# Patient Record
Sex: Male | Born: 1955 | Hispanic: No | Smoking: Current every day smoker
Health system: Southern US, Community
[De-identification: ages and names within clinical notes are randomized; demographics above are authoritative.]

## PROBLEM LIST (undated history)

## (undated) VITALS — BP 89/57 | HR 94 | Temp 98.3°F | Resp 18 | Ht 70.5 in | Wt 186.0 lb

## (undated) VITALS — BP 89/63 | HR 84 | Temp 97.2°F | Resp 16 | Ht 71.0 in | Wt 180.0 lb

## (undated) DIAGNOSIS — G35 Multiple sclerosis: Secondary | ICD-10-CM

## (undated) DIAGNOSIS — G259 Extrapyramidal and movement disorder, unspecified: Secondary | ICD-10-CM

## (undated) DIAGNOSIS — F32A Depression, unspecified: Secondary | ICD-10-CM

## (undated) DIAGNOSIS — H539 Unspecified visual disturbance: Secondary | ICD-10-CM

## (undated) DIAGNOSIS — F329 Major depressive disorder, single episode, unspecified: Secondary | ICD-10-CM

## (undated) DIAGNOSIS — G35D Multiple sclerosis, unspecified: Secondary | ICD-10-CM

## (undated) DIAGNOSIS — G35A Relapsing-remitting multiple sclerosis: Secondary | ICD-10-CM

## (undated) DIAGNOSIS — G709 Myoneural disorder, unspecified: Secondary | ICD-10-CM

## (undated) DIAGNOSIS — F99 Mental disorder, not otherwise specified: Secondary | ICD-10-CM

## (undated) HISTORY — DX: Unspecified visual disturbance: H53.9

## (undated) HISTORY — PX: TONSILLECTOMY: SUR1361

## (undated) HISTORY — DX: Extrapyramidal and movement disorder, unspecified: G25.9

---

## 1997-07-30 ENCOUNTER — Other Ambulatory Visit: Admission: RE | Admit: 1997-07-30 | Discharge: 1997-07-30 | Payer: Self-pay | Admitting: Obstetrics and Gynecology

## 1997-09-14 ENCOUNTER — Ambulatory Visit (HOSPITAL_COMMUNITY): Admission: RE | Admit: 1997-09-14 | Discharge: 1997-09-14 | Payer: Self-pay | Admitting: Urology

## 1998-06-23 ENCOUNTER — Ambulatory Visit (HOSPITAL_COMMUNITY): Admission: RE | Admit: 1998-06-23 | Discharge: 1998-06-23 | Payer: Self-pay | Admitting: Obstetrics and Gynecology

## 1998-09-30 ENCOUNTER — Ambulatory Visit (HOSPITAL_COMMUNITY): Admission: RE | Admit: 1998-09-30 | Discharge: 1998-09-30 | Payer: Self-pay | Admitting: Neurology

## 2001-09-14 ENCOUNTER — Encounter: Payer: Self-pay | Admitting: Emergency Medicine

## 2001-09-14 ENCOUNTER — Emergency Department (HOSPITAL_COMMUNITY): Admission: EM | Admit: 2001-09-14 | Discharge: 2001-09-14 | Payer: Self-pay | Admitting: Emergency Medicine

## 2003-10-17 ENCOUNTER — Emergency Department (HOSPITAL_COMMUNITY): Admission: EM | Admit: 2003-10-17 | Discharge: 2003-10-17 | Payer: Self-pay | Admitting: *Deleted

## 2006-12-09 ENCOUNTER — Ambulatory Visit (HOSPITAL_COMMUNITY): Admission: RE | Admit: 2006-12-09 | Discharge: 2006-12-09 | Payer: Self-pay | Admitting: Urology

## 2006-12-10 ENCOUNTER — Emergency Department (HOSPITAL_COMMUNITY): Admission: EM | Admit: 2006-12-10 | Discharge: 2006-12-11 | Payer: Self-pay | Admitting: Emergency Medicine

## 2007-01-15 ENCOUNTER — Ambulatory Visit (HOSPITAL_COMMUNITY): Payer: Self-pay | Admitting: Psychiatry

## 2008-04-21 ENCOUNTER — Ambulatory Visit: Payer: Self-pay | Admitting: Psychiatry

## 2008-04-21 ENCOUNTER — Inpatient Hospital Stay (HOSPITAL_COMMUNITY): Admission: RE | Admit: 2008-04-21 | Discharge: 2008-04-27 | Payer: Self-pay | Admitting: Psychiatry

## 2008-04-29 ENCOUNTER — Other Ambulatory Visit (HOSPITAL_COMMUNITY): Admission: RE | Admit: 2008-04-29 | Discharge: 2008-05-21 | Payer: Self-pay | Admitting: Psychiatry

## 2008-05-04 ENCOUNTER — Ambulatory Visit: Payer: Self-pay | Admitting: Psychiatry

## 2008-05-26 ENCOUNTER — Inpatient Hospital Stay (HOSPITAL_COMMUNITY): Admission: RE | Admit: 2008-05-26 | Discharge: 2008-06-01 | Payer: Self-pay | Admitting: *Deleted

## 2008-05-27 ENCOUNTER — Ambulatory Visit: Payer: Self-pay | Admitting: *Deleted

## 2008-07-16 ENCOUNTER — Inpatient Hospital Stay (HOSPITAL_COMMUNITY): Admission: EM | Admit: 2008-07-16 | Discharge: 2008-07-20 | Payer: Self-pay | Admitting: Emergency Medicine

## 2008-07-16 ENCOUNTER — Ambulatory Visit: Payer: Self-pay | Admitting: Pulmonary Disease

## 2008-07-20 ENCOUNTER — Inpatient Hospital Stay (HOSPITAL_COMMUNITY): Admission: RE | Admit: 2008-07-20 | Discharge: 2008-07-27 | Payer: Self-pay | Admitting: Psychiatry

## 2008-07-20 ENCOUNTER — Ambulatory Visit: Payer: Self-pay | Admitting: *Deleted

## 2008-09-15 ENCOUNTER — Emergency Department (HOSPITAL_COMMUNITY): Admission: EM | Admit: 2008-09-15 | Discharge: 2008-09-16 | Payer: Self-pay | Admitting: Emergency Medicine

## 2008-09-16 ENCOUNTER — Inpatient Hospital Stay (HOSPITAL_COMMUNITY): Admission: RE | Admit: 2008-09-16 | Discharge: 2008-09-21 | Payer: Self-pay | Admitting: Psychiatry

## 2008-09-16 ENCOUNTER — Ambulatory Visit: Payer: Self-pay | Admitting: Psychiatry

## 2008-12-09 ENCOUNTER — Emergency Department (HOSPITAL_COMMUNITY): Admission: EM | Admit: 2008-12-09 | Discharge: 2008-12-10 | Payer: Self-pay | Admitting: Emergency Medicine

## 2008-12-10 ENCOUNTER — Ambulatory Visit: Payer: Self-pay | Admitting: *Deleted

## 2008-12-10 ENCOUNTER — Inpatient Hospital Stay (HOSPITAL_COMMUNITY): Admission: RE | Admit: 2008-12-10 | Discharge: 2008-12-14 | Payer: Self-pay | Admitting: *Deleted

## 2008-12-24 ENCOUNTER — Inpatient Hospital Stay (HOSPITAL_COMMUNITY): Admission: EM | Admit: 2008-12-24 | Discharge: 2009-01-04 | Payer: Self-pay | Admitting: Emergency Medicine

## 2008-12-24 ENCOUNTER — Ambulatory Visit: Payer: Self-pay | Admitting: Pulmonary Disease

## 2009-05-04 ENCOUNTER — Inpatient Hospital Stay (HOSPITAL_COMMUNITY): Admission: RE | Admit: 2009-05-04 | Discharge: 2009-05-16 | Payer: Self-pay | Admitting: Psychiatry

## 2009-05-04 ENCOUNTER — Ambulatory Visit: Payer: Self-pay | Admitting: Psychiatry

## 2009-05-24 ENCOUNTER — Emergency Department (HOSPITAL_COMMUNITY): Admission: EM | Admit: 2009-05-24 | Discharge: 2009-05-30 | Payer: Self-pay | Admitting: Emergency Medicine

## 2009-11-13 ENCOUNTER — Emergency Department (HOSPITAL_COMMUNITY): Admission: EM | Admit: 2009-11-13 | Discharge: 2009-11-15 | Payer: Self-pay | Admitting: Emergency Medicine

## 2009-11-14 ENCOUNTER — Ambulatory Visit: Payer: Self-pay | Admitting: Psychiatry

## 2009-11-15 ENCOUNTER — Inpatient Hospital Stay (HOSPITAL_COMMUNITY)
Admission: RE | Admit: 2009-11-15 | Discharge: 2009-11-18 | Payer: Self-pay | Source: Home / Self Care | Admitting: Psychiatry

## 2009-11-15 ENCOUNTER — Ambulatory Visit: Payer: Self-pay | Admitting: Psychiatry

## 2010-01-12 ENCOUNTER — Encounter: Admission: RE | Admit: 2010-01-12 | Discharge: 2010-01-12 | Payer: Self-pay | Admitting: Family Medicine

## 2010-07-08 LAB — BASIC METABOLIC PANEL
BUN: 12 mg/dL (ref 6–23)
CO2: 23 mEq/L (ref 19–32)
Calcium: 8.7 mg/dL (ref 8.4–10.5)
Chloride: 111 mEq/L (ref 96–112)
Creatinine, Ser: 1.01 mg/dL (ref 0.4–1.5)
GFR calc Af Amer: >60 mL/min (ref >60)
GFR calc non Af Amer: >60 mL/min (ref >60)
Glucose, Bld: 93 mg/dL (ref 70–99)
Potassium: 3.6 mEq/L (ref 3.5–5.1)
Sodium: 141 mEq/L (ref 135–145)

## 2010-07-08 LAB — URINALYSIS, ROUTINE W REFLEX MICROSCOPIC
Bilirubin Urine: NEGATIVE
Glucose, UA: NEGATIVE mg/dL
Hgb urine dipstick: NEGATIVE
Nitrite: NEGATIVE
Protein, ur: NEGATIVE mg/dL
Specific Gravity, Urine: 1.024 (ref 1.005–1.030)
Urobilinogen, UA: 1 mg/dL (ref 0.0–1.0)
pH: 6 (ref 5.0–8.0)

## 2010-07-08 LAB — DIFFERENTIAL
Basophils Absolute: 0.1 10*3/uL (ref 0.0–0.1)
Basophils Relative: 1 % (ref 0–1)
Eosinophils Absolute: 0.1 10*3/uL (ref 0.0–0.7)
Eosinophils Relative: 1 % (ref 0–5)
Lymphocytes Relative: 4 % — ABNORMAL LOW (ref 12–46)
Lymphs Abs: 0.2 10*3/uL — ABNORMAL LOW (ref 0.7–4.0)
Monocytes Absolute: 0.5 10*3/uL (ref 0.1–1.0)
Monocytes Relative: 10 % (ref 3–12)
Neutro Abs: 4.3 10*3/uL (ref 1.7–7.7)
Neutrophils Relative %: 84 % — ABNORMAL HIGH (ref 43–77)

## 2010-07-08 LAB — HEPATIC FUNCTION PANEL
ALT: 11 U/L (ref 0–53)
AST: 17 U/L (ref 0–37)
Albumin: 3.4 g/dL — ABNORMAL LOW (ref 3.5–5.2)
Alkaline Phosphatase: 51 U/L (ref 39–117)
Bilirubin, Direct: 0.1 mg/dL (ref 0.0–0.3)
Indirect Bilirubin: 0.6 mg/dL (ref 0.3–0.9)
Total Bilirubin: 0.7 mg/dL (ref 0.3–1.2)
Total Protein: 6.2 g/dL (ref 6.0–8.3)

## 2010-07-08 LAB — RAPID URINE DRUG SCREEN, HOSP PERFORMED
Amphetamines: NOT DETECTED
Barbiturates: NOT DETECTED
Benzodiazepines: NOT DETECTED
Cocaine: NOT DETECTED
Opiates: NOT DETECTED
Tetrahydrocannabinol: NOT DETECTED

## 2010-07-08 LAB — CBC
HCT: 42 % (ref 39.0–52.0)
Hemoglobin: 14.6 g/dL (ref 13.0–17.0)
MCH: 32.2 pg (ref 26.0–34.0)
MCHC: 34.8 g/dL (ref 30.0–36.0)
MCV: 92.4 fL (ref 78.0–100.0)
Platelets: 211 10*3/uL (ref 150–400)
RBC: 4.54 MIL/uL (ref 4.22–5.81)
RDW: 15.2 % (ref 11.5–15.5)
WBC: 5.1 10*3/uL (ref 4.0–10.5)

## 2010-07-08 LAB — TSH: TSH: 4.332 u[IU]/mL (ref 0.350–4.500)

## 2010-07-08 LAB — ETHANOL: Alcohol, Ethyl (B): 65 mg/dL — ABNORMAL HIGH (ref 0–10)

## 2010-07-09 LAB — DRUGS OF ABUSE SCREEN W/O ALC, ROUTINE URINE
Amphetamine Screen, Ur: NEGATIVE
Barbiturate Quant, Ur: NEGATIVE
Benzodiazepines.: NEGATIVE
Cocaine Metabolites: NEGATIVE
Creatinine,U: 59.5 mg/dL
Marijuana Metabolite: NEGATIVE
Methadone: NEGATIVE
Opiate Screen, Urine: NEGATIVE
Phencyclidine (PCP): NEGATIVE
Propoxyphene: NEGATIVE

## 2010-07-09 LAB — URINALYSIS, ROUTINE W REFLEX MICROSCOPIC
Bilirubin Urine: NEGATIVE
Glucose, UA: NEGATIVE mg/dL
Hgb urine dipstick: NEGATIVE
Ketones, ur: NEGATIVE mg/dL
Nitrite: NEGATIVE
Protein, ur: NEGATIVE mg/dL
Specific Gravity, Urine: 1.013 (ref 1.005–1.030)
Urobilinogen, UA: 1 mg/dL (ref 0.0–1.0)
pH: 7 (ref 5.0–8.0)

## 2010-07-09 LAB — COMPREHENSIVE METABOLIC PANEL
ALT: 12 U/L (ref 0–53)
AST: 19 U/L (ref 0–37)
Albumin: 3.8 g/dL (ref 3.5–5.2)
Alkaline Phosphatase: 58 U/L (ref 39–117)
BUN: 18 mg/dL (ref 6–23)
CO2: 27 mEq/L (ref 19–32)
Calcium: 9.1 mg/dL (ref 8.4–10.5)
Chloride: 109 mEq/L (ref 96–112)
Creatinine, Ser: 1.05 mg/dL (ref 0.4–1.5)
GFR calc Af Amer: >60 mL/min (ref >60)
GFR calc non Af Amer: >60 mL/min (ref >60)
Glucose, Bld: 94 mg/dL (ref 70–99)
Potassium: 4 mEq/L (ref 3.5–5.1)
Sodium: 142 mEq/L (ref 135–145)
Total Bilirubin: 0.6 mg/dL (ref 0.3–1.2)
Total Protein: 6.2 g/dL (ref 6.0–8.3)

## 2010-07-09 LAB — CBC
HCT: 40.7 % (ref 39.0–52.0)
Hemoglobin: 14 g/dL (ref 13.0–17.0)
MCHC: 34.4 g/dL (ref 30.0–36.0)
MCV: 92.3 fL (ref 78.0–100.0)
Platelets: 168 10*3/uL (ref 150–400)
RBC: 4.41 MIL/uL (ref 4.22–5.81)
RDW: 14.8 % (ref 11.5–15.5)
WBC: 9.9 10*3/uL (ref 4.0–10.5)

## 2010-07-09 LAB — LIPID PANEL
Cholesterol: 234 mg/dL — ABNORMAL HIGH (ref 0–200)
HDL: 34 mg/dL — ABNORMAL LOW (ref >39)
LDL Cholesterol: 153 mg/dL — ABNORMAL HIGH (ref 0–99)
Total CHOL/HDL Ratio: 6.9 RATIO
Triglycerides: 236 mg/dL — ABNORMAL HIGH (ref <150)
VLDL: 47 mg/dL — ABNORMAL HIGH (ref 0–40)

## 2010-07-09 LAB — PSA: PSA: 0.83 ng/mL (ref 0.10–4.00)

## 2010-07-09 LAB — TESTOSTERONE: Testosterone: 453.46 ng/dL (ref 350–890)

## 2010-07-09 LAB — TSH: TSH: 2.912 u[IU]/mL (ref 0.350–4.500)

## 2010-07-09 LAB — HEMOGLOBIN A1C
Hgb A1c MFr Bld: 5.6 % (ref 4.6–6.1)
Mean Plasma Glucose: 114 mg/dL

## 2010-07-09 LAB — RPR: RPR Ser Ql: NONREACTIVE

## 2010-07-09 LAB — T4, FREE: Free T4: 1.18 ng/dL (ref 0.80–1.80)

## 2010-07-12 LAB — URINALYSIS, ROUTINE W REFLEX MICROSCOPIC
Glucose, UA: NEGATIVE mg/dL
Hgb urine dipstick: NEGATIVE
Ketones, ur: >80 mg/dL — AB
Leukocytes, UA: NEGATIVE
Nitrite: NEGATIVE
Protein, ur: 30 mg/dL — AB
Specific Gravity, Urine: 1.027 (ref 1.005–1.030)
Urobilinogen, UA: 1 mg/dL (ref 0.0–1.0)
pH: 6 (ref 5.0–8.0)

## 2010-07-12 LAB — BASIC METABOLIC PANEL
BUN: 15 mg/dL (ref 6–23)
CO2: 20 mEq/L (ref 19–32)
Calcium: 9.1 mg/dL (ref 8.4–10.5)
Chloride: 104 mEq/L (ref 96–112)
Creatinine, Ser: 1.34 mg/dL (ref 0.4–1.5)
GFR calc Af Amer: >60 mL/min (ref >60)
GFR calc non Af Amer: 56 mL/min — ABNORMAL LOW (ref >60)
Glucose, Bld: 62 mg/dL — ABNORMAL LOW (ref 70–99)
Potassium: 4.5 mEq/L (ref 3.5–5.1)
Sodium: 135 mEq/L (ref 135–145)

## 2010-07-12 LAB — DIFFERENTIAL
Basophils Absolute: 0.1 10*3/uL (ref 0.0–0.1)
Basophils Relative: 1 % (ref 0–1)
Eosinophils Absolute: 0.1 10*3/uL (ref 0.0–0.7)
Eosinophils Relative: 1 % (ref 0–5)
Lymphocytes Relative: 46 % (ref 12–46)
Lymphs Abs: 4.7 10*3/uL — ABNORMAL HIGH (ref 0.7–4.0)
Monocytes Absolute: 0.6 10*3/uL (ref 0.1–1.0)
Monocytes Relative: 6 % (ref 3–12)
Neutro Abs: 4.7 10*3/uL (ref 1.7–7.7)
Neutrophils Relative %: 46 % (ref 43–77)

## 2010-07-12 LAB — CBC
HCT: 44.1 % (ref 39.0–52.0)
Hemoglobin: 15.3 g/dL (ref 13.0–17.0)
MCHC: 34.6 g/dL (ref 30.0–36.0)
MCV: 92.1 fL (ref 78.0–100.0)
Platelets: 251 10*3/uL (ref 150–400)
RBC: 4.79 MIL/uL (ref 4.22–5.81)
RDW: 14.7 % (ref 11.5–15.5)
WBC: 10.2 10*3/uL (ref 4.0–10.5)

## 2010-07-12 LAB — RAPID URINE DRUG SCREEN, HOSP PERFORMED
Amphetamines: NOT DETECTED
Barbiturates: NOT DETECTED
Benzodiazepines: NOT DETECTED
Cocaine: NOT DETECTED
Opiates: NOT DETECTED
Tetrahydrocannabinol: NOT DETECTED

## 2010-07-12 LAB — TRICYCLICS SCREEN, URINE: TCA Scrn: NOT DETECTED

## 2010-07-12 LAB — ETHANOL: Alcohol, Ethyl (B): <5 mg/dL (ref 0–10)

## 2010-07-12 LAB — URINE MICROSCOPIC-ADD ON

## 2010-07-28 LAB — DIFFERENTIAL
Basophils Absolute: 0.1 10*3/uL (ref 0.0–0.1)
Basophils Relative: 1 % (ref 0–1)
Eosinophils Absolute: 0.1 10*3/uL (ref 0.0–0.7)
Eosinophils Relative: 1 % (ref 0–5)
Lymphocytes Relative: 54 % — ABNORMAL HIGH (ref 12–46)
Lymphs Abs: 5.6 10*3/uL — ABNORMAL HIGH (ref 0.7–4.0)
Monocytes Absolute: 0.6 10*3/uL (ref 0.1–1.0)
Monocytes Relative: 5 % (ref 3–12)
Neutro Abs: 4 10*3/uL (ref 1.7–7.7)
Neutrophils Relative %: 39 % — ABNORMAL LOW (ref 43–77)

## 2010-07-28 LAB — CARDIAC PANEL(CRET KIN+CKTOT+MB+TROPI)
CK, MB: 1.2 ng/mL (ref 0.3–4.0)
Relative Index: INVALID (ref 0.0–2.5)
Total CK: 71 U/L (ref 7–232)
Troponin I: 0.01 ng/mL (ref 0.00–0.06)

## 2010-07-28 LAB — BASIC METABOLIC PANEL
BUN: 7 mg/dL (ref 6–23)
BUN: 9 mg/dL (ref 6–23)
CO2: 28 mEq/L (ref 19–32)
CO2: 32 mEq/L (ref 19–32)
Calcium: 8.7 mg/dL (ref 8.4–10.5)
Calcium: 8.8 mg/dL (ref 8.4–10.5)
Chloride: 105 mEq/L (ref 96–112)
Chloride: 110 mEq/L (ref 96–112)
Creatinine, Ser: 0.86 mg/dL (ref 0.4–1.5)
Creatinine, Ser: 1.07 mg/dL (ref 0.4–1.5)
GFR calc Af Amer: >60 mL/min (ref >60)
GFR calc Af Amer: >60 mL/min (ref >60)
GFR calc non Af Amer: >60 mL/min (ref >60)
GFR calc non Af Amer: >60 mL/min (ref >60)
Glucose, Bld: 82 mg/dL (ref 70–99)
Glucose, Bld: 93 mg/dL (ref 70–99)
Potassium: 3.7 mEq/L (ref 3.5–5.1)
Potassium: 3.9 mEq/L (ref 3.5–5.1)
Sodium: 142 mEq/L (ref 135–145)
Sodium: 145 mEq/L (ref 135–145)

## 2010-07-28 LAB — URINE CULTURE
Colony Count: NO GROWTH
Culture: NO GROWTH

## 2010-07-28 LAB — CBC
HCT: 37.1 % — ABNORMAL LOW (ref 39.0–52.0)
HCT: 42.4 % (ref 39.0–52.0)
HCT: 42.4 % (ref 39.0–52.0)
Hemoglobin: 12.7 g/dL — ABNORMAL LOW (ref 13.0–17.0)
Hemoglobin: 14.4 g/dL (ref 13.0–17.0)
Hemoglobin: 14.6 g/dL (ref 13.0–17.0)
MCHC: 34 g/dL (ref 30.0–36.0)
MCHC: 34.3 g/dL (ref 30.0–36.0)
MCHC: 34.4 g/dL (ref 30.0–36.0)
MCV: 93.3 fL (ref 78.0–100.0)
MCV: 93.3 fL (ref 78.0–100.0)
MCV: 93.5 fL (ref 78.0–100.0)
Platelets: 164 10*3/uL (ref 150–400)
Platelets: 176 10*3/uL (ref 150–400)
Platelets: 180 10*3/uL (ref 150–400)
RBC: 3.98 MIL/uL — ABNORMAL LOW (ref 4.22–5.81)
RBC: 4.53 MIL/uL (ref 4.22–5.81)
RBC: 4.54 MIL/uL (ref 4.22–5.81)
RDW: 14.4 % (ref 11.5–15.5)
RDW: 14.6 % (ref 11.5–15.5)
RDW: 14.9 % (ref 11.5–15.5)
WBC: 10.1 10*3/uL (ref 4.0–10.5)
WBC: 10.4 10*3/uL (ref 4.0–10.5)
WBC: 12.5 10*3/uL — ABNORMAL HIGH (ref 4.0–10.5)

## 2010-07-28 LAB — RAPID URINE DRUG SCREEN, HOSP PERFORMED
Amphetamines: NOT DETECTED
Barbiturates: NOT DETECTED
Benzodiazepines: NOT DETECTED
Cocaine: NOT DETECTED
Opiates: NOT DETECTED
Tetrahydrocannabinol: NOT DETECTED

## 2010-07-28 LAB — BLOOD GAS, ARTERIAL
Acid-base deficit: 1.6 mmol/L (ref 0.0–2.0)
Bicarbonate: 24.5 mEq/L — ABNORMAL HIGH (ref 20.0–24.0)
Drawn by: 317771
FIO2: 1 %
MECHVT: 600 mL
O2 Saturation: 96.6 %
PEEP: 5 cmH2O
Patient temperature: 98.6
RATE: 14 resp/min
TCO2: 22.3 mmol/L (ref 0–100)
pCO2 arterial: 49.6 mmHg — ABNORMAL HIGH (ref 35.0–45.0)
pH, Arterial: 7.315 — ABNORMAL LOW (ref 7.350–7.450)
pO2, Arterial: 296 mmHg — ABNORMAL HIGH (ref 80.0–100.0)

## 2010-07-28 LAB — COMPREHENSIVE METABOLIC PANEL
ALT: 14 U/L (ref 0–53)
AST: 19 U/L (ref 0–37)
Albumin: 4.1 g/dL (ref 3.5–5.2)
Alkaline Phosphatase: 62 U/L (ref 39–117)
BUN: 9 mg/dL (ref 6–23)
CO2: 25 mEq/L (ref 19–32)
Calcium: 9.3 mg/dL (ref 8.4–10.5)
Chloride: 109 mEq/L (ref 96–112)
Creatinine, Ser: 0.78 mg/dL (ref 0.4–1.5)
GFR calc Af Amer: >60 mL/min (ref >60)
GFR calc non Af Amer: >60 mL/min (ref >60)
Glucose, Bld: 69 mg/dL — ABNORMAL LOW (ref 70–99)
Potassium: 3.4 mEq/L — ABNORMAL LOW (ref 3.5–5.1)
Sodium: 143 mEq/L (ref 135–145)
Total Bilirubin: 0.5 mg/dL (ref 0.3–1.2)
Total Protein: 7 g/dL (ref 6.0–8.3)

## 2010-07-28 LAB — ETHANOL: Alcohol, Ethyl (B): 157 mg/dL — ABNORMAL HIGH (ref 0–10)

## 2010-07-28 LAB — GLUCOSE, CAPILLARY: Glucose-Capillary: 98 mg/dL (ref 70–99)

## 2010-07-28 LAB — SALICYLATE LEVEL: Salicylate Lvl: <4 mg/dL (ref 2.8–20.0)

## 2010-07-28 LAB — ACETAMINOPHEN LEVEL: Acetaminophen (Tylenol), Serum: <10 ug/mL — ABNORMAL LOW (ref 10–30)

## 2010-07-29 LAB — RAPID URINE DRUG SCREEN, HOSP PERFORMED
Amphetamines: NOT DETECTED
Barbiturates: NOT DETECTED
Benzodiazepines: NOT DETECTED
Cocaine: NOT DETECTED
Opiates: NOT DETECTED
Tetrahydrocannabinol: POSITIVE — AB

## 2010-07-29 LAB — BASIC METABOLIC PANEL
BUN: 14 mg/dL (ref 6–23)
CO2: 27 mEq/L (ref 19–32)
Calcium: 9.1 mg/dL (ref 8.4–10.5)
Chloride: 106 mEq/L (ref 96–112)
Creatinine, Ser: 0.78 mg/dL (ref 0.4–1.5)
GFR calc Af Amer: >60 mL/min (ref >60)
GFR calc non Af Amer: >60 mL/min (ref >60)
Glucose, Bld: 98 mg/dL (ref 70–99)
Potassium: 3.9 mEq/L (ref 3.5–5.1)
Sodium: 142 mEq/L (ref 135–145)

## 2010-07-29 LAB — URINALYSIS, ROUTINE W REFLEX MICROSCOPIC
Bilirubin Urine: NEGATIVE
Glucose, UA: NEGATIVE mg/dL
Hgb urine dipstick: NEGATIVE
Ketones, ur: NEGATIVE mg/dL
Nitrite: NEGATIVE
Protein, ur: NEGATIVE mg/dL
Specific Gravity, Urine: 1.023 (ref 1.005–1.030)
Urobilinogen, UA: 1 mg/dL (ref 0.0–1.0)
pH: 6.5 (ref 5.0–8.0)

## 2010-07-29 LAB — CBC
HCT: 40.6 % (ref 39.0–52.0)
Hemoglobin: 14.3 g/dL (ref 13.0–17.0)
MCHC: 35.1 g/dL (ref 30.0–36.0)
MCV: 92.6 fL (ref 78.0–100.0)
Platelets: 176 10*3/uL (ref 150–400)
RBC: 4.39 MIL/uL (ref 4.22–5.81)
RDW: 13.8 % (ref 11.5–15.5)
WBC: 8.7 10*3/uL (ref 4.0–10.5)

## 2010-07-29 LAB — DIFFERENTIAL
Basophils Absolute: 0.1 10*3/uL (ref 0.0–0.1)
Basophils Relative: 1 % (ref 0–1)
Eosinophils Absolute: 0.2 10*3/uL (ref 0.0–0.7)
Eosinophils Relative: 3 % (ref 0–5)
Lymphocytes Relative: 51 % — ABNORMAL HIGH (ref 12–46)
Lymphs Abs: 4.4 10*3/uL — ABNORMAL HIGH (ref 0.7–4.0)
Monocytes Absolute: 0.5 10*3/uL (ref 0.1–1.0)
Monocytes Relative: 5 % (ref 3–12)
Neutro Abs: 3.5 10*3/uL (ref 1.7–7.7)
Neutrophils Relative %: 40 % — ABNORMAL LOW (ref 43–77)

## 2010-07-29 LAB — ETHANOL: Alcohol, Ethyl (B): <5 mg/dL (ref 0–10)

## 2010-08-01 LAB — COMPREHENSIVE METABOLIC PANEL
ALT: 15 U/L (ref 0–53)
AST: 18 U/L (ref 0–37)
Albumin: 4.3 g/dL (ref 3.5–5.2)
Alkaline Phosphatase: 69 U/L (ref 39–117)
BUN: 8 mg/dL (ref 6–23)
CO2: 23 mEq/L (ref 19–32)
Calcium: 9.2 mg/dL (ref 8.4–10.5)
Chloride: 113 mEq/L — ABNORMAL HIGH (ref 96–112)
Creatinine, Ser: 0.89 mg/dL (ref 0.4–1.5)
GFR calc Af Amer: >60 mL/min (ref >60)
GFR calc non Af Amer: >60 mL/min (ref >60)
Glucose, Bld: 87 mg/dL (ref 70–99)
Potassium: 4.2 mEq/L (ref 3.5–5.1)
Sodium: 142 mEq/L (ref 135–145)
Total Bilirubin: 0.7 mg/dL (ref 0.3–1.2)
Total Protein: 7.1 g/dL (ref 6.0–8.3)

## 2010-08-01 LAB — RAPID URINE DRUG SCREEN, HOSP PERFORMED
Amphetamines: NOT DETECTED
Barbiturates: NOT DETECTED
Benzodiazepines: NOT DETECTED
Cocaine: NOT DETECTED
Opiates: NOT DETECTED
Tetrahydrocannabinol: POSITIVE — AB

## 2010-08-01 LAB — URINALYSIS, ROUTINE W REFLEX MICROSCOPIC
Bilirubin Urine: NEGATIVE
Glucose, UA: NEGATIVE mg/dL
Hgb urine dipstick: NEGATIVE
Ketones, ur: NEGATIVE mg/dL
Nitrite: NEGATIVE
Protein, ur: NEGATIVE mg/dL
Specific Gravity, Urine: 1.013 (ref 1.005–1.030)
Urobilinogen, UA: 0.2 mg/dL (ref 0.0–1.0)
pH: 6 (ref 5.0–8.0)

## 2010-08-01 LAB — CBC
HCT: 42.9 % (ref 39.0–52.0)
Hemoglobin: 14.6 g/dL (ref 13.0–17.0)
MCHC: 34 g/dL (ref 30.0–36.0)
MCV: 93.4 fL (ref 78.0–100.0)
Platelets: 202 10*3/uL (ref 150–400)
RBC: 4.59 MIL/uL (ref 4.22–5.81)
RDW: 15.2 % (ref 11.5–15.5)
WBC: 11.3 10*3/uL — ABNORMAL HIGH (ref 4.0–10.5)

## 2010-08-01 LAB — DIFFERENTIAL
Basophils Absolute: 0.2 10*3/uL — ABNORMAL HIGH (ref 0.0–0.1)
Basophils Relative: 2 % — ABNORMAL HIGH (ref 0–1)
Eosinophils Absolute: 0.1 10*3/uL (ref 0.0–0.7)
Eosinophils Relative: 1 % (ref 0–5)
Lymphocytes Relative: 41 % (ref 12–46)
Lymphs Abs: 4.6 10*3/uL — ABNORMAL HIGH (ref 0.7–4.0)
Monocytes Absolute: 0.6 10*3/uL (ref 0.1–1.0)
Monocytes Relative: 6 % (ref 3–12)
Neutro Abs: 5.7 10*3/uL (ref 1.7–7.7)
Neutrophils Relative %: 51 % (ref 43–77)

## 2010-08-01 LAB — ETHANOL: Alcohol, Ethyl (B): <5 mg/dL (ref 0–10)

## 2010-08-03 LAB — POCT I-STAT 3, ART BLOOD GAS (G3+)
Acid-base deficit: 2 mmol/L (ref 0.0–2.0)
Bicarbonate: 23.5 mEq/L (ref 20.0–24.0)
O2 Saturation: 98 %
Patient temperature: 99.1
TCO2: 25 mmol/L (ref 0–100)
pCO2 arterial: 41.5 mmHg (ref 35.0–45.0)
pH, Arterial: 7.362 (ref 7.350–7.450)
pO2, Arterial: 107 mmHg — ABNORMAL HIGH (ref 80.0–100.0)

## 2010-08-03 LAB — MAGNESIUM: Magnesium: 2.1 mg/dL (ref 1.5–2.5)

## 2010-08-03 LAB — SALICYLATE LEVEL: Salicylate Lvl: <4 mg/dL (ref 2.8–20.0)

## 2010-08-03 LAB — COMPREHENSIVE METABOLIC PANEL
ALT: 12 U/L (ref 0–53)
ALT: 18 U/L (ref 0–53)
AST: 14 U/L (ref 0–37)
AST: 23 U/L (ref 0–37)
Albumin: 3.7 g/dL (ref 3.5–5.2)
Albumin: 4.1 g/dL (ref 3.5–5.2)
Alkaline Phosphatase: 60 U/L (ref 39–117)
Alkaline Phosphatase: 69 U/L (ref 39–117)
BUN: 11 mg/dL (ref 6–23)
BUN: 13 mg/dL (ref 6–23)
CO2: 22 mEq/L (ref 19–32)
CO2: 25 mEq/L (ref 19–32)
Calcium: 8.7 mg/dL (ref 8.4–10.5)
Calcium: 9.5 mg/dL (ref 8.4–10.5)
Chloride: 113 mEq/L — ABNORMAL HIGH (ref 96–112)
Chloride: 114 mEq/L — ABNORMAL HIGH (ref 96–112)
Creatinine, Ser: 0.8 mg/dL (ref 0.4–1.5)
Creatinine, Ser: 0.9 mg/dL (ref 0.4–1.5)
GFR calc Af Amer: >60 mL/min (ref >60)
GFR calc Af Amer: >60 mL/min (ref >60)
GFR calc non Af Amer: >60 mL/min (ref >60)
GFR calc non Af Amer: >60 mL/min (ref >60)
Glucose, Bld: 59 mg/dL — ABNORMAL LOW (ref 70–99)
Glucose, Bld: 81 mg/dL (ref 70–99)
Potassium: 3.5 mEq/L (ref 3.5–5.1)
Potassium: 4.4 mEq/L (ref 3.5–5.1)
Sodium: 143 mEq/L (ref 135–145)
Sodium: 145 mEq/L (ref 135–145)
Total Bilirubin: 0.4 mg/dL (ref 0.3–1.2)
Total Bilirubin: 0.8 mg/dL (ref 0.3–1.2)
Total Protein: 6.1 g/dL (ref 6.0–8.3)
Total Protein: 6.4 g/dL (ref 6.0–8.3)

## 2010-08-03 LAB — RAPID URINE DRUG SCREEN, HOSP PERFORMED
Amphetamines: NOT DETECTED
Barbiturates: NOT DETECTED
Benzodiazepines: NOT DETECTED
Cocaine: NOT DETECTED
Opiates: NOT DETECTED
Tetrahydrocannabinol: POSITIVE — AB

## 2010-08-03 LAB — CULTURE, BLOOD (ROUTINE X 2)
Culture: NO GROWTH
Culture: NO GROWTH

## 2010-08-03 LAB — GLUCOSE, CAPILLARY
Glucose-Capillary: 75 mg/dL (ref 70–99)
Glucose-Capillary: 80 mg/dL (ref 70–99)
Glucose-Capillary: 83 mg/dL (ref 70–99)

## 2010-08-03 LAB — CBC
HCT: 41.1 % (ref 39.0–52.0)
HCT: 41.9 % (ref 39.0–52.0)
Hemoglobin: 14.1 g/dL (ref 13.0–17.0)
Hemoglobin: 14.7 g/dL (ref 13.0–17.0)
MCHC: 34.5 g/dL (ref 30.0–36.0)
MCHC: 35.2 g/dL (ref 30.0–36.0)
MCV: 93.5 fL (ref 78.0–100.0)
MCV: 94.5 fL (ref 78.0–100.0)
Platelets: 163 10*3/uL (ref 150–400)
Platelets: 166 10*3/uL (ref 150–400)
RBC: 4.35 MIL/uL (ref 4.22–5.81)
RBC: 4.48 MIL/uL (ref 4.22–5.81)
RDW: 15.5 % (ref 11.5–15.5)
RDW: 15.6 % — ABNORMAL HIGH (ref 11.5–15.5)
WBC: 10.1 10*3/uL (ref 4.0–10.5)
WBC: 8.1 10*3/uL (ref 4.0–10.5)

## 2010-08-03 LAB — ETHANOL: Alcohol, Ethyl (B): 127 mg/dL — ABNORMAL HIGH (ref 0–10)

## 2010-08-03 LAB — DIFFERENTIAL
Basophils Absolute: 0.1 10*3/uL (ref 0.0–0.1)
Basophils Relative: 1 % (ref 0–1)
Eosinophils Absolute: 0.1 10*3/uL (ref 0.0–0.7)
Eosinophils Relative: 1 % (ref 0–5)
Lymphocytes Relative: 42 % (ref 12–46)
Lymphs Abs: 3.4 10*3/uL (ref 0.7–4.0)
Monocytes Absolute: 0.4 10*3/uL (ref 0.1–1.0)
Monocytes Relative: 6 % (ref 3–12)
Neutro Abs: 4 10*3/uL (ref 1.7–7.7)
Neutrophils Relative %: 50 % (ref 43–77)

## 2010-08-03 LAB — PROTIME-INR
INR: 1 (ref 0.00–1.49)
Prothrombin Time: 12.9 seconds (ref 11.6–15.2)

## 2010-08-03 LAB — CULTURE, BAL-QUANTITATIVE W GRAM STAIN: Colony Count: >=100000

## 2010-08-03 LAB — ACETAMINOPHEN LEVEL: Acetaminophen (Tylenol), Serum: <10 ug/mL — ABNORMAL LOW (ref 10–30)

## 2010-08-03 LAB — PHOSPHORUS: Phosphorus: 4.7 mg/dL — ABNORMAL HIGH (ref 2.3–4.6)

## 2010-08-03 LAB — TRIGLYCERIDES
Triglycerides: 159 mg/dL — ABNORMAL HIGH (ref <150)
Triglycerides: 263 mg/dL — ABNORMAL HIGH (ref <150)

## 2010-08-03 LAB — CHOLESTEROL, TOTAL: Cholesterol: 246 mg/dL — ABNORMAL HIGH (ref 0–200)

## 2010-08-03 LAB — APTT: aPTT: 25 seconds (ref 24–37)

## 2010-08-07 LAB — COMPREHENSIVE METABOLIC PANEL
ALT: 14 U/L (ref 0–53)
AST: 16 U/L (ref 0–37)
Albumin: 3.7 g/dL (ref 3.5–5.2)
Alkaline Phosphatase: 51 U/L (ref 39–117)
BUN: 14 mg/dL (ref 6–23)
CO2: 25 mEq/L (ref 19–32)
Calcium: 9.1 mg/dL (ref 8.4–10.5)
Chloride: 110 mEq/L (ref 96–112)
Creatinine, Ser: 0.97 mg/dL (ref 0.4–1.5)
GFR calc Af Amer: >60 mL/min (ref >60)
GFR calc non Af Amer: >60 mL/min (ref >60)
Glucose, Bld: 91 mg/dL (ref 70–99)
Potassium: 3.7 mEq/L (ref 3.5–5.1)
Sodium: 143 mEq/L (ref 135–145)
Total Bilirubin: 0.8 mg/dL (ref 0.3–1.2)
Total Protein: 6.2 g/dL (ref 6.0–8.3)

## 2010-08-07 LAB — CBC
HCT: 40.4 % (ref 39.0–52.0)
Hemoglobin: 13.8 g/dL (ref 13.0–17.0)
MCHC: 34.1 g/dL (ref 30.0–36.0)
MCV: 93.5 fL (ref 78.0–100.0)
Platelets: 222 10*3/uL (ref 150–400)
RBC: 4.32 MIL/uL (ref 4.22–5.81)
RDW: 15.3 % (ref 11.5–15.5)
WBC: 10.7 10*3/uL — ABNORMAL HIGH (ref 4.0–10.5)

## 2010-09-05 ENCOUNTER — Other Ambulatory Visit: Payer: Self-pay | Admitting: Family Medicine

## 2010-09-05 ENCOUNTER — Ambulatory Visit
Admission: RE | Admit: 2010-09-05 | Discharge: 2010-09-05 | Disposition: A | Discharge status: Home / Self Care | Payer: Medicare Other | Source: Ambulatory Visit | Attending: Family Medicine | Admitting: Family Medicine

## 2010-09-05 DIAGNOSIS — R059 Cough, unspecified: Secondary | ICD-10-CM

## 2010-09-05 DIAGNOSIS — R05 Cough: Secondary | ICD-10-CM

## 2010-09-05 NOTE — Discharge Summary (Signed)
[name], [name]                  ACCOUNT NO.:  [id number]   MEDICAL RECORD NO.:  [id number]          PATIENT TYPE:  INP   LOCATION:  3021                         FACILITY:  [facility]   PHYSICIAN:  Coralyn Helling, MD        DATE OF BIRTH:  20-Mar-1956   DATE OF ADMISSION:  07/16/2008  DATE OF DISCHARGE:  07/19/2008                               DISCHARGE SUMMARY   DISCHARGE DIAGNOSES:  Consist of:  1. Vent-dependent respiratory failure secondary to suicide attempt      with overdose of sedation.  2. Altered mental status.  3. Drug overdose and suicidal attempt.   HPI:  Mr. [name] is a 55 year old white male with history of  multiple sclerosis who told the paramedics he was tired of living and  attempted overdose using benzodiazepines with narcotics and ETOH.  He  required intubation for airway protection.  Pulmonary critical care was  asked to admit to the unit.  He has had endotracheal from July 16, 2008, to July 17, 2008, Foley from July 16, 2008, to July 17, 2008.   PAST MEDICAL HISTORY:  Significant for:  1. Depression.  2. Multiple sclerosis, followed by Dr. Ilsa Iha of [facility]      Neurology.  3. Dyslipidemia.  4. Alcohol abuse.  5. Tobacco abuse.   ALLERGIES:  NO KNOWN ALLERGIES.   LAB DATA:  Hemoglobin 14.4, hematocrit 41.4, platelets are 166, WBCs  10.0, sodium 143, potassium 4.4, chloride 114, CO2 is 22, BUN is 13,  creatinine 0.8, glucose is 81, cholesterol 246, triglycerides are 263,  phosphorus 4.7, calcium 8.7, magnesium 2.1.  Bronchoalveolar lavage  demonstrated gram-negative rods, gram-positive cocci which is  preliminary in nature.  Blood cultures x2 are negative at this time but  they remain preliminary.  Urine culture is no growth.   RADIOGRAPHIC DATA:  Chest x-ray on July 19, 2008, lungs show  emphysematous.  There is a bibasilar air space opacity greater on the  right than on the left.  Heart size is normal.  CT of the head without  contrast  demonstrates no acute intracranial abnormalities, atrophy, and  chronic microvascular ischemic changes, chronic sinus disease, and  postoperative changes.   HOSPITAL COURSE BY DISCHARGE DIAGNOSES:  1. Vent-dependent respiratory failure secondary to suicide attempt.      He was successfully removed from mechanical ventilatory support in      24 hours.  He has no respiratory difficulties at this time and has      reached maximum hospital benefit.  2. Suicide attempt with polypharmacy.  He will be transferred to      [facility] for further evaluation and treatment.  3. History of depression.  Again, he is going to [facility].  4. Multiple sclerosis, followed by Dr. Ilsa Iha at [facility]      Neurology.  This is a moot point at this time.   DISCHARGE MEDICATIONS:  1. Abilify 10 mg b.i.d.  2. Effexor 75 mg daily.  3. He is on lorazepam withdrawal protocol.  He is currently on day 4.  DIET:  Regular diet.   DISPOSITION/CONDITION UPON DISCHARGE:  Vent-dependent respiratory  failure that has resolved.  He has known depression and with a suicide  attempt he is voluntarily being transferred to [facility]  for further evaluation and treatment.      Devra Dopp, MSN, ACNP      Coralyn Helling, MD  Electronically Signed    SM/MEDQ  D:  07/19/2008  T:  07/19/2008  Job:  [id number]
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
## 2010-09-05 NOTE — H&P (Signed)
[name], [name] NO.:  [id number]   MEDICAL RECORD NO.:  [id number]          PATIENT TYPE:  IPS   LOCATION:  0305                          FACILITY:  [facility]   PHYSICIAN:  Jasmine Pang, M.D. DATE OF BIRTH:  [birth year]   DATE OF ADMISSION:  12/10/2008  DATE OF DISCHARGE:                       PSYCHIATRIC ADMISSION ASSESSMENT   This a 55 year old male, voluntarily admitted on 12/10/2008.   HISTORY OF PRESENT ILLNESS:  The patient reports having thoughts to  overdose on his medications, was becoming increasingly depressed.  States that he get some more depressed especially at the night hours,  when he is alone.  He did spend the day with his son, enjoyed himself,  but states when he was dropped off by his wife whom he is currently  separated from, he again was just feeling very depressed.  He called the  nurse on the ACT Team who transported him to the emergency department.  He reports has been compliant with his medications, sleeps well.  Denies  any psychotic symptoms.   PAST PSYCHIATRIC HISTORY:  The patient has been here before.  He was  here last in early June.  This is now his fourth admission.  He has had  a history of overdosing in the past and the patient is currently  followed by the ACT Team.   SOCIAL HISTORY:  The patient is separated from his wife, has a 9-year-  old son, who will be starting the fourth grade.  The patient lives alone  and he is on disability.   FAMILY HISTORY:  None.   ALCOHOL AND DRUG HISTORY:  The patient smokes marijuana on occasion.   PRIMARY CARE [name]:  Listed as none.   MEDICAL PROBLEMS:  A history of multiple sclerosis and bursitis.   MEDICATIONS:  1. Abilify 20 mg.  2. Pristiq 100 mg.  3. Acetazolamide 150 mg daily.   DRUG ALLERGIES:  No known allergies.   PHYSICAL EXAM:  This is a middle-aged male, currently walking with a  cane.  He was assessed at [facility].  His physical exam was  reviewed.  No gross focal deficits.  He appears in no distress.  He  offers no complaints at this time.   LABORATORY DATA:  Shows a CBC that is within normal limits.  Alcohol  level less than 5.  His BMET is within normal limits.  His urine drug  screen is positive for marijuana.  Urinalysis is negative.   MENTAL STATUS EXAM:  The patient is fully alert and cooperative.  He is  casually dressed, good eye-contact.  His speech is soft, but normal  pace.  The patient's mood is depressed.  He is constricted.  He has a  sense of humor.  Thought processes are coherent, goal directed.  Currently denying any suicidal thoughts.  Promises safety.  Cognitive  function intact.  His memory appears intact.  Judgment and insight  appear to be good.   AXIS I:  Major depressive disorder, recurrent.  Cannabis abuse.  AXIS II:  Deferred.  AXIS III:  Multiple sclerosis.  AXIS IV:  Psychosocial problems.  Medical problems.  AXIS V:  Current is 30-35   PLAN:  Our plan is to resume his medications.  The patient is considered  a fall risk.  We will order the patient's shoes for stability.  The  patient will be continued to follow up with the ACT Team and will have  trazodone for sleep.  His tentative length of stay is 3-5 days.      Landry Corporal, N.P.      Jasmine Pang, M.D.  Electronically Signed    JO/MEDQ  D:  12/10/2008  T:  12/10/2008  Job:  [id number]

## 2010-09-05 NOTE — H&P (Signed)
[name], [name] NO.:  [id number]   MEDICAL RECORD NO.:  [id number]          PATIENT TYPE:  IPS   LOCATION:  0503                          FACILITY:  [facility]   PHYSICIAN:  Geoffery Lyons, M.D.      DATE OF BIRTH:  1955/07/31   DATE OF ADMISSION:  07/20/2008  DATE OF DISCHARGE:                       PSYCHIATRIC ADMISSION ASSESSMENT   IDENTIFYING INFORMATION:  This is a 55 year old male who is married and  separated.  This is a voluntary admission.   HISTORY OF THE PRESENT ILLNESS:  [name] presented initially in the  emergency room after having taken 100 mg of Benadryl, as well as having  relapsed on alcohol.  Initially, paramedics had reported that he was  able to follow commands and responded appropriately.  However, in the  emergency room, his level of consciousness declined and he was  subsequently intubated for airway protection.  He was admitted to the  medical unit for stabilization and there had a psychiatric consult by  Dr. Antonietta Breach, M.D.  He has reported that he has been experiencing  a lot of depression, worse for the past 1 week including anhedonia, poor  concentration, hopelessness, and generally depressed mood with poor  energy.  He is currently living alone and is separated from his wife who  he says left him because of his chronic depressed mood.  Spends a lot of  time at home and feels that idle time is one of his stressors.  He is  currently disabled with multiple sclerosis and does some regular  volunteer work but is alone much of the time.  He has endorsed that his  overdose was intentional.  Denies any homicidal thoughts.  He had  resumed drinking some alcohol this past week and has been treated in the  past for alcohol abuse.  Stabilized on the medical unit from July 16, 2008, until transfer on July 20, 2008.  Alcohol level at e the time of  admission was 127 mg/dL.   PAST PSYCHIATRIC HISTORY:  He has a history of multiple depressive  episodes and alcohol abuse.  Most recent admission at [facility] is April 21, 2008, to April 27, 2008.  He reports a history of depression with  onset after he developed multiple sclerosis which forced him out of  work.  Also, had a stay at [facility] from May 26, 2008, to  June 01, 2008, and at that time was discharged on Abilify 10 mg  b.i.d., Pristiq 50 mg daily, Topamax 25 mg daily, and trazodone 50 mg  q.h.s.   SOCIAL HISTORY:  This is a married male who is currently living alone,  separated from his wife who he says left him because of his chronic  depressed mood.  Also, has a 31-year-old son who is living with his wife.  Patient himself currently volunteers for [employer].  Has a  history of military service and spent 4 years in the [employer].  Currently is  disabled with multiple sclerosis.   MEDICAL HISTORY:  Primary care practitioner is Dr. Marcha Dutton at  [facility].  MEDICAL PROBLEMS:  1. Status post diphenhydramine overdose with ventilator-dependent      respiratory failure.  2. Aspiration pneumonitis which was treated with Zosyn.  3. Multiple sclerosis.   CURRENT MEDICATIONS AT THE TIME OF TRANSFER:  1. Abilify 10 mg b.i.d.  2. Effexor 75 mg daily.   He was on a regular diet and was in the process of being on day 4 of a  lorazepam withdrawal protocol.   DRUG ALLERGIES:  NONE.   PHYSICAL EXAM:  Was done in the medical unit as noted in the record.  On  presentation here, he is a fully alert male.  Appears to be suffering no  ill effects from his respiratory failure and overdose.  Fully  ambulatory, does ambulate with a straight cane, has a stable gait,  participating in all activities.   DIAGNOSTIC STUDIES:  Noted in the record.  Were remarkable for the  alcohol level as noted above.  Hemoglobin 14.4, hematocrit 41.4,  platelets 166,000.  Metabolic panel within normal limits.  Total  cholesterol was 246 with triglycerides 263.  He had a  bronchial alveolar  lavage that demonstrated gram-negative rods and gram-positive cocci  which was addressed on the medical unit.   MENTAL STATUS EXAM:  Reveals a fully alert male, pleasant, cooperative  but did depressed in appearance.  Speech is normal.  Gives a coherent  history.  Does have rather intense stare as an eye contact.  Still a bit  disheveled.  Some thought blocking with speech.  Oriented x4.  Appears  to be moderately anxious and is anxious in mood.  Thought content is  within normal limits, is depressed, has admitted to suicidal thoughts.  No homicidal thoughts.  No evidence of psychosis or delusional  statements.  Concerns are logical and coherent.  He is in full contact  with reality and appears depressed and sad.   AXIS I:  1. Major depressive episode.  2. Alcohol abuse, rule out dependence.  AXIS II:  No diagnosis.  AXIS III:  1. Status post diphenhydramine overdose with ventilator-dependent      respiratory failure.  2. Multiple sclerosis.  AXIS IV:  Severe marital stressors with separation and social isolation.  AXIS V:  Current is 48, past year not known.   PLAN:  Voluntarily admit him to complete his detox, alleviate his  suicidal thoughts.  He had previously done well on Pristiq and we will  restart him on his Pristiq 100 mg which he was taking in January and  felt that he was doing well on it and we will restart his Abilify at 15  mg p.o. b.i.d.      Young Berry. Scott, N.P.      Geoffery Lyons, M.D.  Electronically Signed    MAS/MEDQ  D:  07/22/2008  T:  07/22/2008  Job:  [id number]

## 2010-09-05 NOTE — Consult Note (Signed)
NAMEBRYLEE, MCGREAL NO.:  [id number]   MEDICAL RECORD NO.:  [id number]          PATIENT TYPE:  INP   LOCATION:  3021                         FACILITY:  [facility]   PHYSICIAN:  Antonietta Breach, M.D.  DATE OF BIRTH:  05/21/1955   DATE OF CONSULTATION:  07/19/2008  DATE OF DISCHARGE:                                 CONSULTATION   REASON FOR CONSULTATION:  Overdose and suicidal ideation.   REQUESTING PHYSICIAN:  Charlcie Cradle. Delford Field, MD, FCCP   HISTORY OF PRESENT ILLNESS:  Mr. Scott is a 55 year old male admitted to  the [facility] on July 16, 2008 after a deliberate overdose.   Mr. Goggins does acknowledge suicidal intent.  He has been experiencing a  return of depression for over 1 week including anhedonia, poor  concentration, hopelessness, thoughts of helplessness and depressed mood  as well as decreased energy.   He states that he and his nephew live at home and that he has developed  severe depressed mood particularly when he is alone at home.   He does remember the time of overdose and recalls well that it was an  intentional overdose to commit suicide.   Another large stress includes his advanced multiple sclerosis.   He had acknowledged at the time that the paramedics arrived that he was  tired of living with his pain and deliberately took 100 tablets of  Benadryl along with alcohol to kill himself.  By the time Mr. Shamblin  arrived to the emergency room he had minimal response to painful stimuli  and had an episode of vomiting.  He was at risk for aspiration.  He  therefore required intubation.   He is now extubated and on a regular medical floor.  He continues with  severe depression.   PAST PSYCHIATRIC HISTORY:  Mr. Mcnamee does have a history of multiple  depressive episodes.  He states that he never developed depression until  his multiple sclerosis forced him out of work.   It was at that time that he began to feel a need to have more fellowship  with people. Up until that time he was very comfortable living alone and  being by himself.  He describes having spent 4 years in the [place] where  he was very comfortable being alone.   Mr. Corporan also describes periods lasting at least 1 month where he would  drink a 12-pack of beer per day and a case of beer per day on the  weekends. He has undergone alcohol rehabilitation before and has been to  [facility] before.   He relapsed this past weekend when he consumed alcohol with his  overdose.   He has undergone psychiatric admission in the past including recently 2  admissions already this year.   In review of this past medical record he was discharged from the [facility]  behavioral health center on April 27, 2008 on Lexapro 10 mg daily,  Risperdal 1 mg b.i.d. On June 01, 2008 he was discharged from the  [facility] behavioral health center.  He was on the medications Abilify 10 mg  b.i.d. and Pristiq 100 mg daily along with trazodone 200 mg q.h.s.   FAMILY PSYCHIATRIC HISTORY:  None known.   SOCIAL HISTORY:  Please see the above.  Mr. Partin also has recently  separated from his wife.  However, they are still managing to share a  car and remain civil with each other. Mr. Staten is unemployed and  medically disabled.  He spent 4 years in the [place]  remotely.   PAST MEDICAL HISTORY:  Status post Benadryl and alcohol overdose,  multiple sclerosis, dyslipidemia.   MEDICATIONS:  The MAR is reviewed.  He is on the Ativan withdrawal  protocol.  He remains on Effexor 75 mg daily and Abilify 10 mg b.i.d.   ALLERGIES:  He has no known drug allergies.   LABORATORY DATA:  Sodium 143, BUN 13, creatinine 0.9, glucose 81. INR on  March 27 was normal at 1. WBC 10.1, hemoglobin 14.1, platelet count 166.  SGOT on March 26 was 14, SGPT 12.  His albumin on that day was 4.1.  Aspirin negative. Alcohol was 127 by the time he arrived to the  emergency room.  Tylenol negative. Urine drug screen was  positive for  tetrahydrocannabinol.   REVIEW OF SYSTEMS:  Constitutional, head, eyes, ears, nose and throat,  mouth neurologic, psychiatric, cardiovascular, respiratory,  gastrointestinal, genitourinary, skin, musculoskeletal, hematologic,  lymphatic, endocrine, and metabolic are all unremarkable.   PHYSICAL EXAMINATION:  VITAL SIGNS:  Temperature 99.5, pulse 84,  respiratory rate 19, blood pressure 121/77, O2 saturation on room air  93%.  GENERAL APPEARANCE:  Mr. Delis is a middle-aged male sitting up in his  hospital bed with no abnormal involuntary movements.  He does not have  any tremors or sweats.   MENTAL STATUS EXAM:  Mr. Miley is alert.  He maintains good eye contact.  His affect is very constricted.  His attention span is slightly  decreased.  Mood is profoundly depressed.  Concentration is mildly  decreased.  He is oriented to all spheres.  His memory is intact for  immediate, recent, and remote except for the intubation.   His fund of knowledge and intelligence are normal.  His speech is soft  but with normal rate and prosody.  He does have poverty of speech.  Thought process is logical, coherent, goal-directed.  No looseness of  associations.  Thought content:  He acknowledges suicidal intent.  He  does not have any current hallucinations or delusions evident.  His  insight is partial. His judgment is impaired.   ASSESSMENT:  AXIS I:  293.83 mood disorder not otherwise specified  (idiopathic, general, medical and substance factors), depressed.  Alcohol dependence.  Cannabis abuse.  AXIS II:  Deferred.  AXIS III:  See past medical history.  AXIS IV:  General medical, primary support group.  AXIS V:  30.   Mr. Thielen is still at risk to harm himself.   RECOMMENDATIONS:  1. Continue suicide precautions.  2. Would admit to an inpatient psychiatric unit for dual diagnosis      treatment.  3. Will defer changes to psychotropic medication.  However, would       continue the Ativan withdrawal protocol due to the possibility that      the history of some intermittent sobriety is not adequate and      tolerance is present.   Would use caution regarding Ativan and the risk of ataxia, falls, and  dulling of cognitive and memory function.   Would continue thiamine  100 mg daily indefinitely.   Other psychotropic changes deferred.   In discussion with the patient, it was particularly noted that he has  gained a desire for more social support and has not been able to obtain  it.  He is interested in assuming regular 12-step meetings as a way to  obtain sobriety and more social support.  He now identifies being alone  at home as a major factor in the recurrence of his severe depression.  The undersigned recommends that once he is ready for outpatient therapy  that he obtained a 12-step sponsor and regular 12-step meetings, 90  meetings in 90 days.   He also could benefit from outpatient psychotherapy in the form of  cognitive behavioral therapy. This would synergize with his psychotropic  medication.      Antonietta Breach, M.D.  Electronically Signed     JW/MEDQ  D:  07/19/2008  T:  07/19/2008  Job:  [id number]

## 2010-09-05 NOTE — H&P (Signed)
NAMEJAKELL, TRUSTY NO.:  [id number]   MEDICAL RECORD NO.:  [id number]          PATIENT TYPE:  INP   LOCATION:  2101                         FACILITY:  [facility]   PHYSICIAN:  Coralyn Helling, MD        DATE OF BIRTH:  1956-02-15   DATE OF ADMISSION:  07/16/2008  DATE OF DISCHARGE:                              HISTORY & PHYSICAL   ADMITTING DIAGNOSIS:  Suicide attempt.   Mr. Bushart is a 55 year old male, who has a history of severe depression  and advanced multiple sclerosis.  Apparently, the paramedics were called  to evaluate him earlier in the day.  He informed the paramedics that he  was tired of living with his pain for his multiple sclerosis and as a  result intentionally took 100 tablets of Benadryl as well as alcohol.  When the paramedics initially arise, the patient was able to follow  commands and responded appropriately, although he was somnolent.  On  arrival to the emergency room, he had minimal response to painful  stimuli and he did have an episode of vomiting and possible aspiration.  Subsequently to this, he was intubated for airway protection and as a  result, critical care consultation was requested.   PAST MEDICAL HISTORY:  Significant for;  1. Depression.  2. Multiple sclerosis and he is followed by Dr. Ilsa Iha at [facility]      [facility]urology for this.  3. Dyslipidemia.  4. Alcohol abuse.  5. Tobacco abuse.   FAMILY HISTORY:  Significant for coronary disease.   ALLERGIES:  No known drug allergies.   SOCIAL HISTORY:  He works as a Naval architect.  He does smoke cigarettes  and drink alcohol.  I am not able to quantify how much this is.   OUTPATIENT MEDICATIONS:  Per review of his recent discharge summary  show; Abilify, Pristiq, Topamax, and trazodone.   PHYSICAL EXAMINATION:  VITAL SIGNS:  He was seen in the emergency room.  He is intubated and obtunded.  Blood pressure 149/91, heart rate is 90,  respiratory rate is 16 and he is not breathing  over the set rate on the  ventilator, oxygen saturation is 100% on 40% FiO2.  HEENT:  Pupils are equal, but sluggish.  There is no nasal discharge.  He has a nasogastric tube in place.  He has a endotracheal tube in  place.  NECK:  There is no lymphadenopathy.  No jugular venous distention.  No  thyromegaly.  HEART:  S1 and S2.  Regular rate and rhythm.  No murmur.  CHEST:  He has coarse breath sounds at the bases.  There is no wheezing.  ABDOMEN:  Soft, nontender.  Absent bowel sounds.  No masses.  GU:  He has a Foley catheter in place and has no obvious lesions.  EXTREMITIES:  There is no edema, cyanosis, or clubbing.  Peripheral  pulses are palpable and symmetric.  NEUROLOGIC:  He has very minimal response to painful stimuli by having  minimal withdrawal reflex.   LABORATORY DATA:  Urine drug screen was positive for marijuana.  Acetaminophen  level was less than 10.  Salicylate level was less than 4.  Sodium was 145, potassium is 3.5, chloride is 115, CO2 is 25, glucose is  59, BUN was 11, creatinine was 0.9, bilirubin 0.4, alkaline phosphatase  69, AST is 14, ALT is 12, protein is 6.4, albumin is 4.1, and calcium is  9.5.  Alcohol level was 127.  Chest x-ray showed endotracheal tube in  good position with bibasilar patchy airspace densities.   IMPRESSION:  1. Suicide attempt with reported overdose of Benadryl, alcohol, and      urine drug screen positive for marijuana.  We will continue      clinical observation, monitoring his hemodynamics and his      neurologic status.  We will start him empirically on thiamine and      folic acid.  Once his mental status improves, he will need have      further evaluation by psychiatry.  I will also follow up on a      repeat acetaminophen level to exclude the possibility of      acetaminophen toxicity.  2. Acute respiratory failure secondary to mental status change.  He is      to continue on full ventilatory support until his mental  status      improves.  I would follow up with his chest x-ray and his arterial      blood gas.  3. Aspiration pneumonitis.  I will check his blood cultures and BAL.      I will start him empirically on Zosyn.  4. Multiple sclerosis.  Again, he is followed by Dr. Ilsa Iha from      [facility].  Depending upon his clinical course, we may      need to ask the Neurology Service to evaluate him during this      hospitalization.  I will start him on subcutaneous heparin for deep      vein thrombosis prophylaxis.  I will start him on intravenous      Protonix for stress ulcer prophylaxis.  5. Hypoglycemia.  I will continue him on dextrose infusion with his IV      fluid and followup on his electrolytes and monitor his renal      function.      Coralyn Helling, MD  Electronically Signed     VS/MEDQ  D:  07/16/2008  T:  07/17/2008  Job:  [id number]

## 2010-09-05 NOTE — H&P (Signed)
[name] NO.:  [id number]   MEDICAL RECORD NO.:  [id number]          PATIENT TYPE:  IPS   LOCATION:  0501                          [facility]ACILITY:  [facility]   PHYSICIAN:  [name], M.D.      DATE OF BIRTH:  1955/12/02   DATE OF ADMISSION:  09/16/2008  DATE OF DISCHARGE:                       PSYCHIATRIC ADMISSION ASSESSMENT   IDENTIFYING INFORMATION:  This is a 55 year old Caucasian male, married  and separated.  This is a voluntary admission.   HISTORY OF THE PRESENT ILLNESS:  This is the third [facility] admission for  this 55 year old with a history of significant major depression.  His  wife called the rescue squad for him after learning over the phone that  he was intending to immediately commit suicide by overdose.  He says  that he had bought a bottle of over-the-counter sleeping pills about 3  days prior to admission and admits that he had been contemplating  killing himself by overdose.  He was previously admitted in late March  of this year for overdosing on diphenhydramine and suffered ventilator-  dependent respiratory failure.  He reports that his trigger for the  suicidal thoughts has been his wife's discussion about dating other men.  He is having difficulty accepting the fact that they are separated.  They had a previous agreement to allow each other to have separate  sexual partners.  However, at this time after their separation, he is  having a lot of difficulty accepting her dating other men.  The  situation is complicated by the fact that the wife is unhappy in her  current living situation and is wanting to return to the home to live  with him as a roommate and continue to date other men.  Patient is  drinking about a 6 pack every weekend.  States he does not drink during  the week.  Denying any other substance abuse.   PAST PSYCHIATRIC HISTORY:  Patient is currently followed as an  outpatient by the ACT Team.  This is his third [facility] admission.   He has a  history of previous admissions in February and March of this year.  In  late March, had a near fatal overdose of diphenhydramine.  Has a history  of severe depression and multiple sclerosis.   SOCIAL HISTORY:  Married male currently living in his own home with his  young nephew who he hopes will contribute to his rent.  Wife is living  with a friend in [place],  A 6-year-old son who is living with his  wife.  Patient has previously done volunteer activities in the  community.  Disabled with multiple sclerosis.  Has a history of [employer], spent 4 years in the [employer].   MEDICAL HISTORY:  Primary care practitioner is Dr. Marcha Dutton at  [facility].   MEDICAL PROBLEMS:  Include multiple sclerosis.   PAST MEDICAL HISTORY:  Significant for:  1. Diphenhydramine overdose with ventilator-dependent respiratory      failure, March of 2010.  2. Aspiration pneumonitis.   CURRENT MEDICATIONS:  1. Abilify 15 mg  b.i.d.  2. Pristiq 100 mg daily.  3. Ambien 10 mg p.o. q.h.s.   Patient reports he ran out of his medication about 4 days ago.   DRUG ALLERGIES:  NONE.   PHYSICAL EXAM:  Done in the emergency room.  This is a tall, medium-  built male in no distress.  Ambulates with a cane with a stable gait.   DIAGNOSTIC STUDIES:  Were done in the ED.  Urine drug screen was  positive for tetrahydrocannabinol.  Alcohol level less than 5.  Comprehensive metabolic panel generally normal.  Urinalysis  unremarkable.  CBC remarkable for a WBC of 11.3, basophils mildly  elevated relatively at 2%.   MENTAL STATUS EXAM:  Fully alert male, cooperative.  Some slight motor  slowing but appears to be at his baseline.  Affect is blunted.  He  appears depressed, sad.  Admits to suicidal thought.  Got very upset on  the day of admission with his wife saying she wanted to date other men.  At the same time, acknowledges that he had been playing a suicide for at  least a couple of  days.  Speech normal.  Mood depressed.  Thought  process generally logical.  No delusional statements.  No evidence of  psychosis.  Very depressed.  Insight limited.  Fully oriented.   AXIS I:  1. Major depression, recurrent, severe.  2. History of alcohol abuse.  3. Cannabis abuse.  AXIS II:  Deferred.  AXIS III:  Multiple sclerosis.  AXIS IV:  Severe issues with chronic marital discord and significant  medical problems.  AXIS V:  Current 42, past year 61 estimated.   PLAN:  Is to voluntarily admit him to stabilize his mood, evaluate his  depression, alleviate his suicidal thought.  We are going to talk with  the ACT Team at some point before we get him discharged.  Meanwhile, we  will resume his usual medications but we will decrease his Pristiq from  100 mg daily to 50 mg daily and Abilify 15 mg b.i.d.  We will continue  his Ambien 10 mg at night.      Margaret A. Scott, N.P.      [name], M.D.  Electronically Signed    MAS/MEDQ  D:  09/16/2008  T:  09/16/2008  Job:  [id number]

## 2010-09-05 NOTE — Discharge Summary (Signed)
[name], [name] NO.:  [id number]   MEDICAL RECORD NO.:  [id number]          PATIENT TYPE:  IPS   LOCATION:  0306                          FACILITY:  [facility]   PHYSICIAN:  Jasmine Pang, M.D. DATE OF BIRTH:  1955/08/19   DATE OF ADMISSION:  05/26/2008  DATE OF DISCHARGE:  06/01/2008                               DISCHARGE SUMMARY   IDENTIFICATION:  This is a 55 year old separated white male who was  admitted on a voluntary basis on May 26, 2008.   HISTORY OF PRESENT ILLNESS:  The patient states he has had a history of  increased depression and hopelessness.  He stated that the day before  admission, he felt suicidal.  He told his wife from whom he is  separated.  She took him to the [facility] in  [place].  Recent stressors include my wife moved out on me.  He has  been feeling very depressed.  He also has financial stressors and has  multiple sclerosis.  He does not feel he is going to have enough money  to pay his bills monthly.   PAST PSYCHIATRIC HISTORY:  The patient was here last late December 2009,  with a suicidal ideation or plan to overdose.  He currently sees Dr.  Evelene Croon. He also attended our intensive outpatient program and was  discharged 1 week ago and he was also hospitalized back years ago when  the hospital was [facility].  He is scheduled to see Hurley Cisco for therapy.   FAMILY HISTORY:  He denies any family history.   ALCOHOL AND DRUG HISTORY:  The patient uses alcohol, but denies abuse.  He uses marijuana, but not in the past several weeks.  He does smoke  cigarettes.   MEDICAL PROBLEMS:  The patient has had MS for 10 years.  He is followed  by Dr. Drue Second at [facility].  He also has  hypercholesterolemia.   MEDICATIONS:  1. Abilify 10 mg b.i.d.  2. Prestiq 50 mg q. day.  3. Acetazolamide and Lipitor, though he is not taking these last 2.  4. He is also on Topamax 25  mg daily.  5. Trazodone 50 mg p.o. q.h.s.   DRUG ALLERGIES:  No known drug allergies.   PHYSICAL FINDINGS:  There were no acute physical or medical problems  noted on exam by our nurse practitioner.   HOSPITAL COURSE:  Upon admission, the patient was continued on his home  medications of Abilify 10 mg b.i.d., Prestiq 50 mg q. day, Topamax 25 mg  q.h.s., acetazolamide 250 mg q. day, trazodone 50 mg at h.s. and Lipitor  20 mg at h.s.  On May 27, 2008, Prestiq was increased to 100 mg p.o.  q. day and trazodone was increased to 100 mg p.o. q.h.s. may repeat x1  since he was having a hard time sleeping.  In individual sessions with  me, the patient was friendly and cooperative.  He had positive  psychomotor retardation.  Speech was soft and slow.  Mood was depressed  and  anxious with a consistent affect.  On May 28, 2008, sleep was  poor.  Appetite was fair.  Mood continued to be depressed.  He worried  about his money situation.  He had positive suicidal ideation seems  best thing I could have done, put myself out of everyone's misery.  On May 29, 2008, he continued to feel hopeless.  He did not see any  change.  He does not want to go home and come back in a month to the  hospital.  His affect was constricted.  Mood was depressed.  Trazodone  was increased to 150 mg p.o. q.h.s.  On May 30, 2008, trazodone was  increased to 200 mg p.o. q.h.s.  On May 31, 2008, the patient was  less depressed, less anxious.  He stated he wanted to go home tomorrow.  His [place] wife will pick him up.  She has been supportive.  He has  also had visits from other family members this weekend.  He was having  no side effects from his medications.  On June 01, 2008, sleep was  good.  Appetite was good.  Mood was less depressed, less anxious.  Affect consistent with mood.  There was no suicidal or homicidal  ideation.  No thoughts of self-injurious behavior.  No auditory or  visual  hallucinations.  No paranoia or delusions.  Thoughts were logical  and goal-directed.  Thought content.  No predominant theme.  Cognitive  was grossly intact.  Insight good.  Judgment good.  Impulse control  good.  The patient was felt to be safe for discharge today.   DISCHARGE DIAGNOSES:  Axis I:  Mood disorder, not otherwise specified.  Axis II:  None.  Axis III:  Multiple sclerosis and dyslipidemia.  Axis IV:  Severe (problems with primary support group, other  psychosocial problems, medical problems, burden of psychiatric illness).  Axis V:  Global assessment of functioning was 50 upon discharge.  GAF  was 30 upon admission.  GAF highest past year was 60.   DISCHARGE PLANS:  There was no specific activity level or dietary  restrictions.   POSTHOSPITAL CARE PLANS:  The patient will see Dr. Evelene Croon on June 21, 2008  at 9:15 a.m.  He will also go to [facility] for followup  counseling.   DISCHARGE MEDICATIONS:  1. Abilify 10 mg one twice a day.  2. Topamax 25 mg one at bedtime.  3. Lipitor 20 mg at bedtime.  4. Prestiq 100 mg daily.  5. Trazodone 200 mg at bedtime.  6. Acetazolamide as directed.      Jasmine Pang, M.D.  Electronically Signed     BHS/MEDQ  D:  06/01/2008  T:  06/02/2008  Job:  [id number]

## 2010-09-08 NOTE — Consult Note (Signed)
[facility], [place]  Patient:    [name], [name] Visit Number: [id number] MRN: [id number]          Service Type: EMS Location: [facility] Attending Physician:  Hanley Seamen Dictated by:   Gustavus Messing Orlin Hilding, M.D. Proc. Date: 09/14/01 Admit Date:  09/14/2001 Discharge Date: 09/14/2001                            Consultation Report  CHIEF COMPLAINT:  Cannot move anything.  HISTORY OF PRESENT ILLNESS:  Mr. Vera is a 55 year old white male known to me with several year history of multiple sclerosis with diplopia as his presenting symptom.  He has had an abnormal MRI consistent with MS, positive oligoclonal bands in the spinal fluid.  There was something about his presentation with the double vision which suggested we should rule out myasthenia gravis.  Acetylcholine receptor antibody was negative.  He has been on Avonex and generally doing well for the last few years.  Last week he complained of some increased weakness in his leg with keeping his head up. He was seen in the office on Wednesday which was about five days ago and by then his symptoms had resolved.  They put him on an oral prednisone taper double strength dose pack. He seemed to be fine last night, was walking with his cane.  This morning he woke up, seemed okay, hobbled into the living room around 9:15 with his cane, and then "couldnt move anything" except for the eyes.  He could not speak, just could not move anything.  Shortly after arrival to the emergency room this cleared up and he is back at baseline now without any specific treatment.  REVIEW OF SYSTEMS:  Negative for chest pain, shortness of breath, or fevers. Got chronic low grade diplopia.  PAST MEDICAL HISTORY:  Significant only for the multiple sclerosis.  MEDICATIONS:  He takes oral prednisone on occasion and he is on a pack now and takes Avonex.  He is on no muscle relaxers, no Statin.  ALLERGIES:  No known drug  allergies.  SOCIAL HISTORY: He is married.  Does smoke.  Does drink alcohol.  Works as a Naval architect.  FAMILY HISTORY:  Positive for coronary artery disease.  PHYSICAL EXAMINATION  VITAL SIGNS:  Temperature 98.6, pulse 70, respirations 18, blood pressure 136/83, 98% saturation.  HEENT:  Head is normocephalic, atraumatic.  NECK:  Supple without bruits.  HEART:  Regular rate and rhythm.  LUNGS:  Clear to auscultation.  EXTREMITIES:  Without edema.  ABDOMEN:  Positive bowel sounds and nontender.  NEUROLOGIC:  Mental status:  He is awake and alert.  He is perfectly appropriate.  His language is normal.  He is having no difficulty speaking. Cranial nerves:  Pupils are equal and reactive.  Extraocular movements are intact.  He has some subjective diplopia at end gaze, particularly to the left, but has no visible ophthalmoplegia.  Facial sensation is normal.  Facial motor activity is normal.  Hearing is intact.  Palate is symmetric.  Tongue is midline.  There is no dysarthria.  On his motor examination he has normal bulk, tone, and strength in his upper extremities, perhaps some slight psoas weakness in his lower extremities versus giveaway weakness, otherwise completely normal.  He is able to stand and can walk but he does not stand up straight and he walks like a duck with flexed knees and hips.  Reflexes are 2+  in the upper extremities, 3+ in the lower extremities with downgoing toes. Coordination:  Finger-to-nose and heel-to-shin are intact bilaterally. Sensory examination is normal.  MRI of the brain with and without contrast shows abnormalities consistent with multiple sclerosis with a lesion in the brain stem, some others periventricular, but no obvious new lesions.  No enhancement following administration of contrast.  MRI of the cervical spine shows some mild degenerative changes in the disks and joints, but no obvious cord lesions.  IMPRESSION:  Episodes of transient  weakness of the whole body of fairly short duration, minutes to hours, without obvious new findings on examination or MRI scan.  This is not typical of multiple sclerosis, rather unusual.  PLAN:  Patient is at baseline now.  Discussed with him admitting him for observation and treatment with IV steroids versus home and continue oral prednisone.  He elects to go home.  If symptoms return I may need to get a second opinion regarding this process as it is eluding me.  He had an I-STAT that was normal with a sodium of 143, potassium 4.3.  It certainly did not look like there was anything off there.  Follow up in the office. Dictated by:   Gustavus Messing Orlin Hilding, M.D. Attending Physician:  Hanley Seamen DD:  09/14/01 TD:  09/16/01 Job: [id number]

## 2010-09-08 NOTE — Discharge Summary (Signed)
NAMEZYKEEM, BAUSERMAN NO.:  [id number]   MEDICAL RECORD NO.:  [id number]          PATIENT TYPE:  IPS   LOCATION:  0504                          FACILITY:  BH   PHYSICIAN:  Geoffery Lyons, M.D.      DATE OF BIRTH:  1955-05-14   DATE OF ADMISSION:  09/16/2008  DATE OF DISCHARGE:  09/21/2008                               DISCHARGE SUMMARY   CHIEF COMPLAINT AND PRESENT ILLNESS:  This was the third admission to  [facility] for this 55 year old male with history of  recurrent major depression.  Apparently his wife called the rescue squad  for him after learning over the phone that he was intending to commit  suicide by overdose.  He had bought a bottle of over-the-counter  sleeping pills about 3 days prior to this admission and he had been  contemplating killing himself.  Previously admitted late March;  overdosing on diphenhydramine and suffered ventilator dependent  respiratory failure.  He reported his trigger for these thoughts of  suicide has been his wife's discussion about dating other men.  Having  difficulty accepting the fact that they are separated.  They had a  previous agreement to allow each other to have separate sexual partners.  However at this time, after the separation, he is having a lot of  difficulty accepting her dating other men.  The situation was  complicated by the fact that his wife is unhappy in her current living  situation and is wanting to return to the home to live with him as a  roommate, and continue to date other men.  He is drinking about a 6-pack  every weekend.  Says he does not drink during the week.  No other  substance use.   PAST PSYCHIATRIC HISTORY:  Followed as an outpatient by the ACT team;  [facility] time at [facility].  History of previous admission in  February and March.   ALCOHOL AND DRUG HISTORY:  As already stated, episodes of alcohol abuse.   MEDICAL HISTORY:  Multiple sclerosis.   MEDICATIONS:  1. Abilify 50 mg twice a day.  2. Pristiq 100 mg per day.  3. Ambien 10 at bedtime for sleep.   Physical exam failed to show any acute findings.   LABORATORY DATA:  UDS positive for marijuana.  Alcohol level less than  5.  C-Met within normal limits.  CBC:  White blood cells 11.3.   MENTAL STATUS EXAM:  Reveals a fully alert and cooperative male.  Some  psychomotor retardation.  Mood depressed.  Affect constricted.  Thought  processes logical, coherent and relevant.  Admits to thought of suicide,  upset the day of the admission when he heard that the wife was wanting  to date other men.  Has been playing with suicide for at least a couple  of days, but can contract for safety.  Speech was somewhat slowed in  production.  No active homicidal ideas.  No delusions or hallucinations.  Cognition well-preserved.   DISCHARGE MENTAL STATUS:  Axis I: Major depressive disorder.  Rule out  alcohol abuse.  Rule out marijuana abuse.  Axis II: No diagnosis.  Axis III:  Multiple sclerosis.  Axis IV: Moderate.  Axis V: Upon admission 35-40; highest GAF in the last year 60-65.   COURSE IN THE HOSPITAL:  He was admitted, started in individual and  group psychotherapy.  He was placed back on his medications.  Endorsed  that the other trigger for recurrence of the depression was that he was  not able to get his medications refilled.  Started getting more upset  and then he heard that the wife wanted to move back to the house and be  roommates.  The last time he was admitted, there was concern that she  might want to get back with him, but now realized that is just wanting  to get back to the house and be roommates.  We resumed the medication.  We worked on Pharmacologist.  May 28th, he heard that his wife will keep  his son from him due to the fact that in the past, he had overdosed in  front of his son.  He was upset when he heard this, became teary-eyed,  but endorsed he understood.  Endorsed  feeling more down after he heard  this.  We allowed to process what happened and work on coping.  He did  admit that isolating was not helping him any.  He was encouraged to be  more interactive and going to group and deal with his stressors by  learning different coping skills.  He continued to improve.  There was  some communication with the wife that turned out to be more positive on  May 31st.  Wife was coming to see him.  He wanted to get to terms with  the fact that the wife was going to be part of his life, but was not  going to be like it was before.  June 1st,  he was in full contact with  reality.  There was some progress in his ability to accept the fact that  the wife was basically not planning to be back with him as husband and  wife and he was okay with that.  They had talked about how to integrate  him back into the son's life and overall he felt satisfied with the  __________.  Was not endorsing suicidal or homicidal ideations.  Objectively he looked better.  Mood improved, affect brighter.  We  discharged to outpatient followup.   DISCHARGE DIAGNOSES:  Axis I: Major depressive disorder.  Alcohol and  marijuana abuse.  Axis II: No diagnosis.  Axis III:  Multiple sclerosis.  Axis IV:  Moderate.  Axis V: Upon discharge 55-60.   DISCHARGE MEDICATIONS:  1. Pristiq 50 mg per day.  2. Abilify 50 mg twice a day.  3. Ambien 10 at bedtime for sleep.   DISCHARGE FOLLOWUP:  Follow up [facility] team.      Geoffery Lyons, M.D.  Electronically Signed     IL/MEDQ  D:  10/19/2008  T:  10/19/2008  Job:  [id number]
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
## 2010-09-25 ENCOUNTER — Emergency Department (HOSPITAL_COMMUNITY)
Admission: EM | Admit: 2010-09-25 | Discharge: 2010-09-26 | Disposition: A | Discharge status: Other Acute Inpatient Hospital | Payer: Medicare Other | Source: Home / Self Care | Attending: Emergency Medicine | Admitting: Emergency Medicine

## 2010-09-25 DIAGNOSIS — F3289 Other specified depressive episodes: Secondary | ICD-10-CM | POA: Insufficient documentation

## 2010-09-25 DIAGNOSIS — F329 Major depressive disorder, single episode, unspecified: Secondary | ICD-10-CM | POA: Insufficient documentation

## 2010-09-25 DIAGNOSIS — R45851 Suicidal ideations: Secondary | ICD-10-CM | POA: Insufficient documentation

## 2010-09-25 DIAGNOSIS — G35 Multiple sclerosis: Secondary | ICD-10-CM | POA: Insufficient documentation

## 2010-09-25 LAB — COMPREHENSIVE METABOLIC PANEL
ALT: 16 U/L (ref 0–53)
AST: 14 U/L (ref 0–37)
Albumin: 4.4 g/dL (ref 3.5–5.2)
Alkaline Phosphatase: 65 U/L (ref 39–117)
BUN: 12 mg/dL (ref 6–23)
CO2: 24 mEq/L (ref 19–32)
Calcium: 9.1 mg/dL (ref 8.4–10.5)
Chloride: 104 mEq/L (ref 96–112)
Creatinine, Ser: 1.07 mg/dL (ref 0.4–1.5)
GFR calc Af Amer: >60 mL/min (ref >60)
GFR calc non Af Amer: >60 mL/min (ref >60)
Glucose, Bld: 73 mg/dL (ref 70–99)
Potassium: 3.7 mEq/L (ref 3.5–5.1)
Sodium: 139 mEq/L (ref 135–145)
Total Bilirubin: 0.7 mg/dL (ref 0.3–1.2)
Total Protein: 6.9 g/dL (ref 6.0–8.3)

## 2010-09-25 LAB — CBC
HCT: 45.7 % (ref 39.0–52.0)
Hemoglobin: 15.1 g/dL (ref 13.0–17.0)
MCH: 30.5 pg (ref 26.0–34.0)
MCHC: 33 g/dL (ref 30.0–36.0)
MCV: 92.3 fL (ref 78.0–100.0)
Platelets: 197 10*3/uL (ref 150–400)
RBC: 4.95 MIL/uL (ref 4.22–5.81)
RDW: 14.7 % (ref 11.5–15.5)
WBC: 6 10*3/uL (ref 4.0–10.5)

## 2010-09-25 LAB — DIFFERENTIAL
Basophils Absolute: 0 10*3/uL (ref 0.0–0.1)
Basophils Relative: 0 % (ref 0–1)
Eosinophils Absolute: 0 10*3/uL (ref 0.0–0.7)
Eosinophils Relative: 0 % (ref 0–5)
Lymphocytes Relative: 4 % — ABNORMAL LOW (ref 12–46)
Lymphs Abs: 0.3 10*3/uL — ABNORMAL LOW (ref 0.7–4.0)
Monocytes Absolute: 0.6 10*3/uL (ref 0.1–1.0)
Monocytes Relative: 9 % (ref 3–12)
Neutro Abs: 5.1 10*3/uL (ref 1.7–7.7)
Neutrophils Relative %: 86 % — ABNORMAL HIGH (ref 43–77)

## 2010-09-25 LAB — URINALYSIS, ROUTINE W REFLEX MICROSCOPIC
Glucose, UA: NEGATIVE mg/dL
Hgb urine dipstick: NEGATIVE
Ketones, ur: 40 mg/dL — AB
Nitrite: NEGATIVE
Protein, ur: NEGATIVE mg/dL
Specific Gravity, Urine: 1.019 (ref 1.005–1.030)
Urobilinogen, UA: 1 mg/dL (ref 0.0–1.0)
pH: 5 (ref 5.0–8.0)

## 2010-09-25 LAB — RAPID URINE DRUG SCREEN, HOSP PERFORMED
Amphetamines: NOT DETECTED
Barbiturates: NOT DETECTED
Benzodiazepines: NOT DETECTED
Cocaine: NOT DETECTED
Opiates: NOT DETECTED
Tetrahydrocannabinol: NOT DETECTED

## 2010-09-25 LAB — ETHANOL: Alcohol, Ethyl (B): <11 mg/dL — ABNORMAL HIGH (ref 0–10)

## 2010-09-26 ENCOUNTER — Inpatient Hospital Stay (HOSPITAL_COMMUNITY)
Admission: AD | Admit: 2010-09-26 | Discharge: 2010-10-03 | DRG: 885 | Disposition: A | Discharge status: Home / Self Care | Payer: Medicare Other | Source: Ambulatory Visit | Attending: Psychiatry | Admitting: Psychiatry

## 2010-09-26 DIAGNOSIS — Z993 Dependence on wheelchair: Secondary | ICD-10-CM

## 2010-09-26 DIAGNOSIS — Z609 Problem related to social environment, unspecified: Secondary | ICD-10-CM

## 2010-09-26 DIAGNOSIS — F609 Personality disorder, unspecified: Secondary | ICD-10-CM

## 2010-09-26 DIAGNOSIS — E7800 Pure hypercholesterolemia, unspecified: Secondary | ICD-10-CM

## 2010-09-26 DIAGNOSIS — G35 Multiple sclerosis: Secondary | ICD-10-CM

## 2010-09-26 DIAGNOSIS — Z91199 Patient's noncompliance with other medical treatment and regimen due to unspecified reason: Secondary | ICD-10-CM

## 2010-09-26 DIAGNOSIS — Z9119 Patient's noncompliance with other medical treatment and regimen: Secondary | ICD-10-CM

## 2010-09-26 DIAGNOSIS — R45851 Suicidal ideations: Secondary | ICD-10-CM

## 2010-09-26 DIAGNOSIS — F331 Major depressive disorder, recurrent, moderate: Secondary | ICD-10-CM

## 2010-09-26 DIAGNOSIS — F332 Major depressive disorder, recurrent severe without psychotic features: Principal | ICD-10-CM

## 2010-09-27 DIAGNOSIS — F339 Major depressive disorder, recurrent, unspecified: Secondary | ICD-10-CM

## 2010-10-04 NOTE — Discharge Summary (Signed)
  NAMEELIOR, ROBINETTE NO.:  [id number]  MEDICAL RECORD NO.:  [id number]  LOCATION:  0500                          FACILITY:  BH  PHYSICIAN:  Franchot Gallo, MD     DATE OF BIRTH:  [birth year]  DATE OF ADMISSION:  09/26/2010 DATE OF DISCHARGE:  10/03/2010                              DISCHARGE SUMMARY   REASON FOR ADMISSION:  This is a single male who presented with chronic depression, multiple psychiatric hospitalizations, admitted due to depression and suicidal thoughts.  FINAL IMPRESSION:  Axis I:  Major depressive disorder, recurrent, severe. Axis II:  Rule out personality disorder not otherwise specified. Axis III:  Multiple sclerosis. Axis IV:  Social isolation, medical problems, limited support system. Axis V:  Current is 50.  PERTINENT LABS:  Urinalysis is negative.  Electrolytes, no abnormalities.  No measurable alcohol.  Urine drug screen is negative.  SIGNIFICANT FINDINGS:  The patient was admitted to the adult milieu for safety and stabilization.  He will be on the mood disorder group.  We will try to help the patient with his patient assistance program.  We initiated Effexor to help with his depressive symptoms.  His sleep was decreased but beginning to improve.  Appetite was good having.  Moderate depressive symptoms, rating it a 4-5 on a scale of 1-10.  He had vague suicidal thoughts but was able to contract for safety.  We had contact with the nursing home director at the group home where the patient resides.  The patient's sleep was improving.  His appetite was good. Having moderate depressive symptoms, rating it a 4 on a scale of 1-10. Denied any suicidal or homicidal thoughts.  No medication side effects. We increased his Effexor to 75 mg.  The patient was active in attending groups.  He was feeling significantly improved and stated his depression was getting better.  He was trying to keep himself busy, working on a puzzle, and stated  his sleep and appetite were good.  On day of discharge, the patient's sleep was good; appetite was good; mild depressive symptoms rating it a 2 on a scale of 1-10; adamantly denied any suicidal or homicidal thoughts or auditory or visual hallucinations; anxiety was under good control; no medication side effects.  The patient was stable for discharge.  DISCHARGE MEDICATIONS: 1. Hydroxyzine 50 mg taking 2 at bedtime. 2. Risperdal 2 mg q.h.s. 3. Effexor 75 mg daily. 4. Amantadine 100 mg 1 b.i.d. 5. Citalopram 40 mg daily. 6. Gilenya 0.5 mg daily.  The patient was to stop taking his Abilify and trazodone.  FOLLOWUP APPOINTMENT:  With the [facility] on June 12 at 3:00 p.m. at phone number [phone].     Landry Corporal, N.P.   ______________________________ Franchot Gallo, MD    JO/MEDQ  D:  10/04/2010  T:  10/04/2010  Job:  [id number]  Electronically Signed by Limmie Patricia.P. on 10/04/2010 04:33:13 PM Electronically Signed by Franchot Gallo MD on 10/04/2010 04:53:54 PM

## 2010-10-19 NOTE — H&P (Signed)
NAMEBRENDT, DIBLE                  ACCOUNT NO.:  [id number]  MEDICAL RECORD NO.:  [id number]  LOCATION:  0500                          FACILITY:  BH  PHYSICIAN:  Vic Ripper, P.A.-C.DATE OF BIRTH:  02-28-1956  DATE OF ADMISSION:  09/26/2010 DATE OF DISCHARGE:                      PSYCHIATRIC ADMISSION ASSESSMENT   This is an involuntary admission to the services of Dr. Harvie Heck [place]. This is a 55 year old white male who is in the process of divorce.  The patient is well-known to this facility.  Fortunately he has not required admission since about a year ago, July 2011.  Currently he has become depressed over the past month as the copay on his Abilify went up to the point that he could no longer afford it and hence he reports that he has become suicidal.  He lives at [facility].  He has been there since January 2011.  He has attempted suicide many times in the past.  He did have a plan to overdose on sleeping medications.  PAST PSYCHIATRIC HISTORY:  He has been here at the [facility] five or six times before. He has also been to [facility] and has received ECT.  He is currently followed by [facility] and he works with a Therapist, sports at [employer].  SOCIAL HISTORY:  He has a GED.  He went to the eleventh grade.  He was married 14-1/2 years before his wife decided to divorce and they have one son, age 72.  He was diagnosed with MS in 2000.  FAMILY HISTORY:  He states that his sister was found dead in a motel when she was 45. This was out in [place]. She was not married.  He does not really know any other information about her.  ALCOHOL AND DRUG HISTORY:  He reports that he has been sober several months.  According to his intake, he denied any substance abuse issues.  PRIMARY CARE PHYSICIAN:  Dr. Bruna Potter here in town.  His psychiatric care is [facility] over on [facility] [address]  He does see Dr. Despina Arias.  This is  his neurologist from [facility] in [place].  CURRENT MEDICATIONS:  Celexa 40 mg p.o. daily, new MS drug called Gilenya. Milligrams are unknown. Actigall, again milligrams are unknown, and amantadine, again milligrams unknown.  He gets all of his meds at [facility] and we will call and get a reconciliation  DRUG ALLERGIES:  NO KNOWN DRUG ALLERGIES.  POSITIVE PHYSICAL FINDINGS:  He was medically cleared in the ED at [facility]..  His vital signs showed that he is afebrile at  97.9-98.5. His pulse ranged from 75-98, respirations 16-20, blood pressure was 107/70 to 120/85.  Interestingly enough, he had a slightly elevated neutrophil count of 88.  Lymphocytes were low at 4.  Absolute lymphs were also low at 0.3.  His urinalysis was negative.  His electrolytes had no abnormalities.  He had no measurable alcohol and he had no positives in his urine drug screen.  He does use a wheelchair because of his MS.  MENTAL STATUS EXAM:  Tonight he is alert and oriented.  He is casually but appropriately groomed,  dressed and nourished.  His speech was not pressured.  His mood is appropriate to the situation.  He understands why they had to involuntarily commit him but he does not like it. Judgment and insight are fair.  Concentration and memory are intact. Intelligence is at least average.  He denies being suicidal or homicidal.  He denies having auditory or visual hallucination, although he reports that it might be interesting.  DIAGNOSES:   AXIS I: 1. Major depressive disorder, recurrent secondary to noncompliance     with Abilify due to not being able to afford it.  No recent alcohol     abuse. 2. AXIS II:  Rule out personality disorder NOS. 3. AXIS III: Multiple sclerosis. AXIS IV: Limited support system, now financial stress due to copay. AXIS V:  20.  PLAN:  Admit for safety and stabilization.  His meds will be adjusted as indicated. Toward that end we will have the case manager and  pharmacist looked into how to get him patient assistance with Gilenya and Abilify. He already has placement which she is going to return to, [facility], and he already has an outside psychiatric provider through [facility].  He suggests that if he can get his denial letter from the [place] saying that they do not carry Gilenya. He would be eligible for patient assistance through [employer].  The case managers can look into this tomorrow.  Estimated length of stay is 3-5 days.     Vic Ripper, P.A.-C.     MD/MEDQ  D:  09/26/2010  T:  09/26/2010  Job:  [id number]  Electronically Signed by Jaci Lazier ADAMS P.A.-C. on 10/01/2010 09:49:05 AM Electronically Signed by Franchot Gallo MD on 10/19/2010 04:41:14 PM

## 2010-12-20 ENCOUNTER — Emergency Department (HOSPITAL_COMMUNITY): Payer: Medicare Other

## 2010-12-20 ENCOUNTER — Emergency Department (HOSPITAL_COMMUNITY)
Admission: EM | Admit: 2010-12-20 | Discharge: 2010-12-20 | Disposition: A | Discharge status: Home / Self Care | Payer: Medicare Other | Attending: Emergency Medicine | Admitting: Emergency Medicine

## 2010-12-20 DIAGNOSIS — R0609 Other forms of dyspnea: Secondary | ICD-10-CM | POA: Insufficient documentation

## 2010-12-20 DIAGNOSIS — E785 Hyperlipidemia, unspecified: Secondary | ICD-10-CM | POA: Insufficient documentation

## 2010-12-20 DIAGNOSIS — F411 Generalized anxiety disorder: Secondary | ICD-10-CM | POA: Insufficient documentation

## 2010-12-20 DIAGNOSIS — G35 Multiple sclerosis: Secondary | ICD-10-CM | POA: Insufficient documentation

## 2010-12-20 DIAGNOSIS — X500XXA Overexertion from strenuous movement or load, initial encounter: Secondary | ICD-10-CM | POA: Insufficient documentation

## 2010-12-20 DIAGNOSIS — F329 Major depressive disorder, single episode, unspecified: Secondary | ICD-10-CM | POA: Insufficient documentation

## 2010-12-20 DIAGNOSIS — F17200 Nicotine dependence, unspecified, uncomplicated: Secondary | ICD-10-CM | POA: Insufficient documentation

## 2010-12-20 DIAGNOSIS — IMO0002 Reserved for concepts with insufficient information to code with codable children: Secondary | ICD-10-CM | POA: Insufficient documentation

## 2010-12-20 DIAGNOSIS — F3289 Other specified depressive episodes: Secondary | ICD-10-CM | POA: Insufficient documentation

## 2010-12-20 DIAGNOSIS — R0989 Other specified symptoms and signs involving the circulatory and respiratory systems: Secondary | ICD-10-CM | POA: Insufficient documentation

## 2010-12-20 DIAGNOSIS — R071 Chest pain on breathing: Secondary | ICD-10-CM | POA: Insufficient documentation

## 2010-12-20 DIAGNOSIS — Z79899 Other long term (current) drug therapy: Secondary | ICD-10-CM | POA: Insufficient documentation

## 2010-12-20 DIAGNOSIS — R0682 Tachypnea, not elsewhere classified: Secondary | ICD-10-CM | POA: Insufficient documentation

## 2011-02-02 LAB — I-STAT 8, (EC8 V) (CONVERTED LAB)
Acid-base deficit: 7 — ABNORMAL HIGH
BUN: 21
Bicarbonate: 15.6 — ABNORMAL LOW
Chloride: 114 — ABNORMAL HIGH
Glucose, Bld: 102 — ABNORMAL HIGH
HCT: 41
Hemoglobin: 13.9
Operator id: 272551
Potassium: 3.3 — ABNORMAL LOW
Sodium: 143
TCO2: 16
pCO2, Ven: 24.6 — ABNORMAL LOW
pH, Ven: 7.408 — ABNORMAL HIGH

## 2011-02-02 LAB — POCT I-STAT CREATININE
Creatinine, Ser: 1.5
Operator id: 272551

## 2011-02-02 LAB — RAPID URINE DRUG SCREEN, HOSP PERFORMED
Amphetamines: NOT DETECTED
Barbiturates: NOT DETECTED
Benzodiazepines: POSITIVE — AB
Cocaine: NOT DETECTED
Opiates: NOT DETECTED
Tetrahydrocannabinol: POSITIVE — AB

## 2011-02-02 LAB — SALICYLATE LEVEL: Salicylate Lvl: <4

## 2011-02-02 LAB — ETHANOL: Alcohol, Ethyl (B): <5

## 2011-02-02 LAB — ACETAMINOPHEN LEVEL: Acetaminophen (Tylenol), Serum: <10 — ABNORMAL LOW

## 2011-05-04 ENCOUNTER — Emergency Department (HOSPITAL_COMMUNITY)
Admission: EM | Admit: 2011-05-04 | Discharge: 2011-05-05 | Disposition: A | Discharge status: Other Acute Inpatient Hospital | Payer: Medicare Other | Source: Home / Self Care | Attending: Emergency Medicine | Admitting: Emergency Medicine

## 2011-05-04 DIAGNOSIS — F3289 Other specified depressive episodes: Secondary | ICD-10-CM | POA: Insufficient documentation

## 2011-05-04 DIAGNOSIS — R45851 Suicidal ideations: Secondary | ICD-10-CM

## 2011-05-04 DIAGNOSIS — F329 Major depressive disorder, single episode, unspecified: Secondary | ICD-10-CM

## 2011-05-04 DIAGNOSIS — F32A Depression, unspecified: Secondary | ICD-10-CM

## 2011-05-04 LAB — COMPREHENSIVE METABOLIC PANEL
ALT: 13 U/L (ref 0–53)
AST: 12 U/L (ref 0–37)
Albumin: 4.2 g/dL (ref 3.5–5.2)
Alkaline Phosphatase: 72 U/L (ref 39–117)
BUN: 13 mg/dL (ref 6–23)
CO2: 26 mEq/L (ref 19–32)
Calcium: 9.3 mg/dL (ref 8.4–10.5)
Chloride: 102 mEq/L (ref 96–112)
Creatinine, Ser: 1.15 mg/dL (ref 0.50–1.35)
GFR calc Af Amer: 81 mL/min — ABNORMAL LOW (ref >90)
GFR calc non Af Amer: 70 mL/min — ABNORMAL LOW (ref >90)
Glucose, Bld: 84 mg/dL (ref 70–99)
Potassium: 3.9 mEq/L (ref 3.5–5.1)
Sodium: 139 mEq/L (ref 135–145)
Total Bilirubin: 0.2 mg/dL — ABNORMAL LOW (ref 0.3–1.2)
Total Protein: 7.6 g/dL (ref 6.0–8.3)

## 2011-05-04 LAB — CBC
HCT: 44.4 % (ref 39.0–52.0)
Hemoglobin: 14.7 g/dL (ref 13.0–17.0)
MCH: 29.5 pg (ref 26.0–34.0)
MCHC: 33.1 g/dL (ref 30.0–36.0)
MCV: 89.2 fL (ref 78.0–100.0)
Platelets: 224 10*3/uL (ref 150–400)
RBC: 4.98 MIL/uL (ref 4.22–5.81)
RDW: 14.7 % (ref 11.5–15.5)
WBC: 6 10*3/uL (ref 4.0–10.5)

## 2011-05-04 LAB — RAPID URINE DRUG SCREEN, HOSP PERFORMED
Amphetamines: NOT DETECTED
Barbiturates: NOT DETECTED
Benzodiazepines: NOT DETECTED
Cocaine: NOT DETECTED
Opiates: NOT DETECTED
Tetrahydrocannabinol: NOT DETECTED

## 2011-05-04 LAB — ETHANOL: Alcohol, Ethyl (B): <11 mg/dL (ref 0–11)

## 2011-05-04 MED ORDER — ACETAMINOPHEN 325 MG PO TABS: 650.0000 mg | ORAL_TABLET | ORAL | Status: DC | PRN

## 2011-05-04 MED ORDER — ALUM & MAG HYDROXIDE-SIMETH 200-200-20 MG/5ML PO SUSP: 30.0000 mL | ORAL | Status: DC | PRN

## 2011-05-04 MED ORDER — ZOLPIDEM TARTRATE 5 MG PO TABS: 5.0000 mg | ORAL_TABLET | Freq: Every evening | ORAL | Status: DC | PRN

## 2011-05-04 MED ORDER — ONDANSETRON HCL 4 MG PO TABS: 4.0000 mg | ORAL_TABLET | Freq: Three times a day (TID) | ORAL | Status: DC | PRN

## 2011-05-04 MED ORDER — LORAZEPAM 1 MG PO TABS: 1.0000 mg | ORAL_TABLET | Freq: Three times a day (TID) | ORAL | Status: DC | PRN

## 2011-05-04 MED ORDER — NICOTINE 21 MG/24HR TD PT24: 21.0000 mg | MEDICATED_PATCH | Freq: Every day | TRANSDERMAL | Status: DC | PRN

## 2011-05-04 MED ORDER — IBUPROFEN 200 MG PO TABS: 600.0000 mg | ORAL_TABLET | Freq: Three times a day (TID) | ORAL | Status: DC | PRN

## 2011-05-04 NOTE — ED Notes (Signed)
Report given to Selena Batten, RN for assumption of care when pt goes to TCU.

## 2011-05-04 NOTE — ED Notes (Signed)
Security called to come wand pt and check belongings. Pt is in blue scrubs-naked underneath, except for glasses.  Pt has a w/c as well.

## 2011-05-04 NOTE — ED Notes (Signed)
Pt states that he has chronic severe depression and recently has had thoughts of suicide by ingesting sleeping pills. Pt verbally contracted that he would not harm himself at this time.

## 2011-05-04 NOTE — ED Notes (Signed)
PT RESTING SITTER AT BEDSIDE

## 2011-05-04 NOTE — ED Notes (Signed)
SITTER AT BEDSIDE

## 2011-05-04 NOTE — BH Assessment (Signed)
Assessment Note   [name] is a 56 y.o. male who presents to the ED with SI with a plan to overdose. Patient states he feels like a "complete looser". He reports several recent stressors including death of stepfather, recent divorce, and finical hardship. Patient reports prior suicide attempts, stating he has tried to overdose several times. He reports prior psychatric inpatient treatment at [facility], [facility], and [facility] Patient reports depressive symptoms including anhedonia, isolation, and an increase in vegetative symptoms. Patient states he sits in bed and watches television or stares at the wall all day. Patient reports he does not currently have an outpatient therapist or a psychiatrist. Patient states he has been taking Celixa and Wellbutrin for past year. Patient lives at [facility] and reports being able to return after treatment. Patient reports he is unable to contract for safety because he does not trust himself at this time.         Axis I: Major Depression, Recurrent severe Axis II: Deferred Axis III: No past medical history on file. Axis IV: economic problems and other psychosocial or environmental problems Axis V: 31-40 impairment in reality testing  Past Medical History: No past medical history on file.  No past surgical history on file.  Family History: No family history on file.  Social History:  does not have a smoking history on file. He does not have any smokeless tobacco history on file. His alcohol and drug histories not on file.  Additional Social History:  Alcohol / Drug Use History of alcohol / drug use?: No history of alcohol / drug abuse Allergies: No Known Allergies  Home Medications:  Medications Prior to Admission  Medication Dose Route Frequency Provider Last Rate Last Dose  . acetaminophen (TYLENOL) tablet 650 mg  650 mg Oral Q4H PRN Nat Christen, MD      . alum & mag hydroxide-simeth (MAALOX/MYLANTA) 200-200-20 MG/5ML suspension 30 mL   30 mL Oral PRN Nat Christen, MD      . ibuprofen (ADVIL,MOTRIN) tablet 600 mg  600 mg Oral Q8H PRN Nat Christen, MD      . LORazepam (ATIVAN) tablet 1 mg  1 mg Oral Q8H PRN Nat Christen, MD      . nicotine (NICODERM CQ - dosed in mg/24 hours) patch 21 mg  21 mg Transdermal Daily PRN Nat Christen, MD      . ondansetron [facility]) tablet 4 mg  4 mg Oral Q8H PRN Nat Christen, MD      . zolpidem (AMBIEN) tablet 5 mg  5 mg Oral QHS PRN Nat Christen, MD       No current outpatient prescriptions on file as of 05/04/2011.    OB/GYN Status:  No LMP for male patient.  General Assessment Data Location of Assessment: [facility] ED Living Arrangements: Group Home Can pt return to current living arrangement?: Yes Admission Status: Voluntary Is patient capable of signing voluntary admission?: Yes Transfer from: Group Home Referral Source: Self/Family/Friend     Risk to self Suicidal Ideation: Yes-Currently Present Suicidal Intent: Yes-Currently Present Is patient at risk for suicide?: Yes Suicidal Plan?: Yes-Currently Present Specify Current Suicidal Plan: Overdose Access to Means: Yes What has been your use of drugs/alcohol within the last 12 months?: none Previous Attempts/Gestures: Yes Other Self Harm Risks: none Triggers for Past Attempts: Family contact;Other personal contacts Intentional Self Injurious Behavior: None Family Suicide History: No Recent stressful life event(s): Loss (Comment);Divorce Persecutory voices/beliefs?: No  Depression: Yes Depression Symptoms: Fatigue;Despondent;Loss of interest in usual pleasures;Feeling worthless/self pity Substance abuse history and/or treatment for substance abuse?: No Suicide prevention information given to non-admitted patients: Not applicable  Risk to Others Homicidal Ideation: No Thoughts of Harm to Others: No Current Homicidal Intent: No Current Homicidal Plan: No Access to Homicidal Means: No Identified  Victim: none History of harm to others?: No Assessment of Violence: None Noted Violent Behavior Description: none Does patient have access to weapons?: No Criminal Charges Pending?: No Does patient have a court date: No  Psychosis Hallucinations: None noted Delusions: None noted  Mental Status Report Appear/Hygiene:  (casual) Eye Contact: Good Motor Activity: Unremarkable Speech: Logical/coherent Level of Consciousness: Alert Mood: Depressed Affect: Blunted Anxiety Level: None Thought Processes: Coherent;Relevant Judgement: Unimpaired Orientation: Person;Place;Time;Situation Obsessive Compulsive Thoughts/Behaviors: None  Cognitive Functioning Concentration: Normal Memory: Recent Intact;Remote Intact IQ: Average Insight: Fair Impulse Control: Fair Appetite: Fair Weight Loss: 0  Weight Gain: 0  Sleep: Increased Vegetative Symptoms: Staying in bed  Prior Inpatient Therapy Prior Inpatient Therapy: Yes Prior Therapy Dates: 7/12 Prior Therapy Facilty/Provider(s): [facility] Reason for Treatment: SI  Prior Outpatient Therapy Prior Outpatient Therapy: No  ADL Screening (condition at time of admission) Patient's cognitive ability adequate to safely complete daily activities?: Yes Patient able to express need for assistance with ADLs?: Yes Independently performs ADLs?: Yes Weakness of Legs: Both Weakness of Arms/Hands: None  Home Assistive Devices/Equipment Home Assistive Devices/Equipment: Wheelchair    Abuse/Neglect Assessment (Assessment to be complete while patient is alone) Physical Abuse: Denies Verbal Abuse: Denies Sexual Abuse: Denies Exploitation of patient/patient's resources: Denies Self-Neglect: Denies Values / Beliefs Cultural Requests During Hospitalization: None Spiritual Requests During Hospitalization: None   Advance Directives (For Healthcare) Advance Directive: Patient does not have advance directive    Additional Information 1:1 In Past 12  Months?: No CIRT Risk: No Elopement Risk: No Does patient have medical clearance?: Yes     Disposition:  Disposition Disposition of Patient: Inpatient treatment program;Referred to [facility]) Type of inpatient treatment program: Adult  On Site Evaluation by:   Reviewed with Physician:     Georgina Quint A 05/04/2011 11:13 PM

## 2011-05-04 NOTE — ED Notes (Signed)
Lives in a group home. States  SI x few yrs on and off.  States the last few days been feeling worse.  Denies any medical complaints.  Plan was to take a whole bottle of sleeping pills and not to call anybody.

## 2011-05-04 NOTE — ED Notes (Signed)
MD at bedside. 

## 2011-05-04 NOTE — ED Notes (Signed)
Pt. Has one belongings bag that has a pt. Label on it. It was placed in locker 30 by Selena Batten, Charity fundraiser. Pt. Also has a wheelchair with him and it is in his room with a pt label on the back of it.

## 2011-05-04 NOTE — ED Notes (Signed)
[id number]> Expected date:<BR> Expected time:<BR> Means of arrival:<BR> Comments:<BR> For room 11

## 2011-05-04 NOTE — ED Provider Notes (Signed)
History     CSN: [id number]  Arrival date & time 05/04/11  1436   First MD Initiated Contact with Patient 05/04/11 1452      Chief Complaint  Patient presents with  . V70.1    Suicidal    (Consider location/radiation/quality/duration/timing/severity/associated sxs/prior treatment) The history is provided by the patient.  pt states hx depression, takes celexa, notes in past couple weeks increased feelings of depression and has been having occasional suicidal thoughts. Symptoms seem to wax/wane in intensity. Denies definite plan. Denies any attempt at self harm. w depression, denies recent exacerbating factors. States health at baseline, denies other symptoms.   No past medical history on file.  No past surgical history on file.  No family history on file.  History  Substance Use Topics  . Smoking status: Not on file  . Smokeless tobacco: Not on file  . Alcohol Use: Not on file      Review of Systems  Constitutional: Negative for fever.  HENT: Negative for neck pain.   Eyes: Negative for redness.  Respiratory: Negative for shortness of breath.   Cardiovascular: Negative for chest pain.  Gastrointestinal: Negative for abdominal pain.  Genitourinary: Negative for flank pain.  Musculoskeletal: Negative for back pain.  Skin: Negative for rash.  Neurological: Negative for headaches.  Hematological: Does not bruise/bleed easily.  Psychiatric/Behavioral: Negative for confusion.    Allergies  Review of patient's allergies indicates no known allergies.  Home Medications  No current outpatient prescriptions on file.  BP 115/81  Pulse 90  Temp(Src) 98.7 F (37.1 C) (Oral)  Resp 18  Ht 5\' 11"  (1.803 m)  Wt 195 lb (88.451 kg)  BMI 27.20 kg/m2  SpO2 100%  Physical Exam  Nursing note and vitals reviewed. Constitutional: He is oriented to person, place, and time. He appears well-developed and well-nourished. No distress.  HENT:  Head: Atraumatic.  Eyes: Pupils are  equal, round, and reactive to light.  Neck: Neck supple. No tracheal deviation present.  Cardiovascular: Normal rate.   Pulmonary/Chest: Effort normal. No accessory muscle usage. No respiratory distress.  Abdominal: Soft. He exhibits no distension. There is no tenderness.  Musculoskeletal: Normal range of motion.  Neurological: He is alert and oriented to person, place, and time.  Skin: Skin is warm and dry.  Psychiatric:       Pt depressed mood. +SI.     ED Course  Procedures (including critical care time)   Labs Reviewed  COMPREHENSIVE METABOLIC PANEL  CBC  URINE RAPID DRUG SCREEN (HOSP PERFORMED)  ETHANOL   Results for orders placed during the hospital encounter of 05/04/11  COMPREHENSIVE METABOLIC PANEL      Component Value Range   Sodium 139  135 - 145 (mEq/L)   Potassium 3.9  3.5 - 5.1 (mEq/L)   Chloride 102  96 - 112 (mEq/L)   CO2 26  19 - 32 (mEq/L)   Glucose, Bld 84  70 - 99 (mg/dL)   BUN 13  6 - 23 (mg/dL)   Creatinine, Ser 4.09  0.50 - 1.35 (mg/dL)   Calcium 9.3  8.4 - 81.1 (mg/dL)   Total Protein 7.6  6.0 - 8.3 (g/dL)   Albumin 4.2  3.5 - 5.2 (g/dL)   AST 12  0 - 37 (U/L)   ALT 13  0 - 53 (U/L)   Alkaline Phosphatase 72  39 - 117 (U/L)   Total Bilirubin 0.2 (*) 0.3 - 1.2 (mg/dL)   GFR calc non Af Amer 70 (*) >  90 (mL/min)   GFR calc Af Amer 81 (*) >90 (mL/min)  CBC      Component Value Range   WBC 6.0  4.0 - 10.5 (K/uL)   RBC 4.98  4.22 - 5.81 (MIL/uL)   Hemoglobin 14.7  13.0 - 17.0 (g/dL)   HCT 16.1  09.6 - 04.5 (%)   MCV 89.2  78.0 - 100.0 (fL)   MCH 29.5  26.0 - 34.0 (pg)   MCHC 33.1  30.0 - 36.0 (g/dL)   RDW 40.9  81.1 - 91.4 (%)   Platelets 224  150 - 400 (K/uL)  URINE RAPID DRUG SCREEN (HOSP PERFORMED)      Component Value Range   Opiates NONE DETECTED  NONE DETECTED    Cocaine NONE DETECTED  NONE DETECTED    Benzodiazepines NONE DETECTED  NONE DETECTED    Amphetamines NONE DETECTED  NONE DETECTED    Tetrahydrocannabinol NONE DETECTED  NONE  DETECTED    Barbiturates NONE DETECTED  NONE DETECTED   ETHANOL      Component Value Range   Alcohol, Ethyl (B) <11  0 - 11 (mg/dL)       MDM  Labs sent.   Nursing notes reviewed, prior charts reviewed.  Act team called to assess.   Pt signed out to Dr Golda Acre, to follow up with ACt assessment and dispo appropriately.       Suzi Roots, MD 05/04/11 [phone]

## 2011-05-04 NOTE — ED Notes (Signed)
[id number]<AV> Expected date:<BR> Expected time:<BR> Means of arrival:<BR> Comments:<BR> For triage 3

## 2011-05-04 NOTE — ED Notes (Signed)
Pt is wheelchair bound and his wheelchair is at the bedside

## 2011-05-05 ENCOUNTER — Inpatient Hospital Stay (HOSPITAL_COMMUNITY)
Admission: AD | Admit: 2011-05-05 | Payer: Medicare Other | Source: Other Acute Inpatient Hospital | Admitting: Psychiatry

## 2011-05-05 ENCOUNTER — Inpatient Hospital Stay (HOSPITAL_COMMUNITY)
Admission: AD | Admit: 2011-05-05 | Discharge: 2011-05-11 | DRG: 881 | Disposition: A | Discharge status: Home / Self Care | Payer: Medicare Other | Source: Ambulatory Visit | Attending: Psychiatry | Admitting: Psychiatry

## 2011-05-05 ENCOUNTER — Encounter (HOSPITAL_COMMUNITY): Payer: Self-pay

## 2011-05-05 DIAGNOSIS — F341 Dysthymic disorder: Secondary | ICD-10-CM | POA: Diagnosis present

## 2011-05-05 DIAGNOSIS — Z79899 Other long term (current) drug therapy: Secondary | ICD-10-CM

## 2011-05-05 DIAGNOSIS — Z609 Problem related to social environment, unspecified: Secondary | ICD-10-CM

## 2011-05-05 DIAGNOSIS — Z993 Dependence on wheelchair: Secondary | ICD-10-CM

## 2011-05-05 DIAGNOSIS — G35 Multiple sclerosis: Secondary | ICD-10-CM

## 2011-05-05 DIAGNOSIS — R45851 Suicidal ideations: Secondary | ICD-10-CM

## 2011-05-05 HISTORY — DX: Myoneural disorder, unspecified: G70.9

## 2011-05-05 HISTORY — DX: Multiple sclerosis: G35

## 2011-05-05 HISTORY — DX: Major depressive disorder, single episode, unspecified: F32.9

## 2011-05-05 HISTORY — DX: Relapsing-remitting multiple sclerosis: G35.A

## 2011-05-05 HISTORY — DX: Depression, unspecified: F32.A

## 2011-05-05 HISTORY — DX: Mental disorder, not otherwise specified: F99

## 2011-05-05 MED ORDER — CITALOPRAM HYDROBROMIDE 40 MG PO TABS
40.0000 mg | ORAL_TABLET | Freq: Every day | ORAL | Status: DC
  Administered 2011-05-05 – 2011-05-11 (×7): 40 mg via ORAL
  Filled 2011-05-05: qty 1
  Filled 2011-05-05: qty 5
  Filled 2011-05-05 (×9): qty 1

## 2011-05-05 MED ORDER — HYDROXYZINE HCL 50 MG PO TABS
50.0000 mg | ORAL_TABLET | Freq: Every day | ORAL | Status: DC
  Administered 2011-05-05 – 2011-05-10 (×6): 50 mg via ORAL
  Filled 2011-05-05: qty 5
  Filled 2011-05-05 (×6): qty 1

## 2011-05-05 MED ORDER — AMANTADINE HCL 100 MG PO CAPS
100.0000 mg | ORAL_CAPSULE | Freq: Two times a day (BID) | ORAL | Status: DC
  Administered 2011-05-05 – 2011-05-11 (×12): 100 mg via ORAL
  Filled 2011-05-05 (×9): qty 1
  Filled 2011-05-05: qty 10
  Filled 2011-05-05 (×2): qty 1
  Filled 2011-05-05: qty 10
  Filled 2011-05-05 (×4): qty 1

## 2011-05-05 MED ORDER — MAGNESIUM HYDROXIDE 400 MG/5ML PO SUSP: 30.0000 mL | Freq: Every day | ORAL | Status: DC | PRN

## 2011-05-05 MED ORDER — ALUM & MAG HYDROXIDE-SIMETH 200-200-20 MG/5ML PO SUSP: 30.0000 mL | ORAL | Status: DC | PRN

## 2011-05-05 MED ORDER — ACETAMINOPHEN 325 MG PO TABS: 650.0000 mg | ORAL_TABLET | Freq: Four times a day (QID) | ORAL | Status: DC | PRN

## 2011-05-05 MED ORDER — BUPROPION HCL 75 MG PO TABS
150.0000 mg | ORAL_TABLET | Freq: Every day | ORAL | Status: DC
  Administered 2011-05-05 – 2011-05-10 (×6): 150 mg via ORAL
  Filled 2011-05-05 (×9): qty 2

## 2011-05-05 MED ORDER — RISPERIDONE 2 MG PO TABS
2.0000 mg | ORAL_TABLET | Freq: Every day | ORAL | Status: DC
  Administered 2011-05-05 – 2011-05-10 (×6): 2 mg via ORAL
  Filled 2011-05-05 (×8): qty 1
  Filled 2011-05-05: qty 5

## 2011-05-05 MED ORDER — HYDROXYZINE PAMOATE 50 MG PO CAPS: 50.0000 mg | ORAL_CAPSULE | Freq: Every day | ORAL | Status: DC

## 2011-05-05 NOTE — H&P (Signed)
Psychiatric Admission Assessment Adult  Patient Identification:  [name] Date of Evaluation:  05/05/2011 Chief Complaint:  Major Depression   I saw the pt with NP and I agree with the key elements in her H&P note dated today.      Wonda Cerise 1/12/20135:29 PM

## 2011-05-05 NOTE — Progress Notes (Signed)
This is a 56 year old wdm admitted voluntarity to the service of Dr. Dan Humphreys. Pt came to the ED due to thoughts of Suicide. States that he has been having a lot of problems and thoughts of hurting himself due to the loss of his step father who died the other day, his physical illness of multiple sclerosis and his declining health, also that he is having problems getting his medication from the [place]. Pt reports that he has been feeling helpless and hopeless for the last several months. States he feels like a loser, failure as a husband and a father. Voices feelings of severe depression.  Due to Pt's multiple sclerosis, Pt must use a wheelchair. Has weakness in both legs. Can ambulate when he feels stronger in his legs.  Denies SI presently but states "I cannot promise anything about tomorrow". Contracts for safety on the unit. Cooperative throughout the admission process.

## 2011-05-05 NOTE — Progress Notes (Signed)
[facility] Adult Inpatient Family/Significant Other Suicide Prevention Education  Suicide Prevention Education:  Contact Attempts:Betty Davis-Group Home Manger where the pt. [phone] has been identified by the patient as the family member/significant other with whom the patient will be residing, and identified as the person(s) who will aid the patient in the event of a mental health crisis.  With written consent from the patient, two attempts were made to provide suicide prevention education, prior to and/or following the patient's discharge.  We were unsuccessful in providing suicide prevention education.  A suicide education pamphlet was given to the patient to share with family/significant other.  Date and time of first attempt: on 05-05-11 by Lamar Blinks at 5:10 p.m Date and time of second attempt:  Neila Gear 05/05/2011, 5:09 PM

## 2011-05-05 NOTE — ED Notes (Signed)
Sitter at bedside.

## 2011-05-05 NOTE — Progress Notes (Signed)
[facility] Group Notes:  (Counselor/Nursing/MHT/Case Management/Adjunct)  05/05/2011 5:04 PM  Type of Therapy:  Group Therapy  Participation Level:  Active  Participation Quality:  Appropriate  Affect:  Appropriate  Cognitive:  Appropriate  Insight:  Good  Engagement in Group:  Good  Engagement in Therapy:  Good  Modes of Intervention:  Clarification and Support  Summary of Progress/Problems:Pt. Participated in group on self sabotaging behaviors and enabling. Each pt. Shared how they self sabotage and spoke about how they could positively enable themselves in order to make positive changes in their lives as well as have a successful recovery. Pt. Spoke about how he tend to isolate himself from others ass his self sabotaging behaviors. Pt. States he is having a hard time making the changes.   [name] 05/05/2011, 5:04 PM

## 2011-05-05 NOTE — [facility] Counselor (Signed)
Adult Psychosocial Assessment Update Interdisciplinary Team  Previous [facility] admissions/discharges:  Admissions Discharges  Date: 09/26/10 Date:10/06/10  Date: Date:  Date: Date:  Date: Date:  Date: Date:   Changes since the last Psychosocial Assessment (including adherence to outpatient mental health and/or substance abuse treatment, situational issues contributing to decompensation and/or relapse). Pt. Reports problems with insurance going up in price on his Ambilfy and pt. Was not able to afford it so he was put on Wellbutrin and reports it is not working. Pt. Has been taking Wellbutrin since June 1012. Pt. Reports the return of his symptoms of SI thoughts and depression.              Discharge Plan 1. Will you be returning to the same living situation after discharge?   Yes: No:      If no, what is your plan?    Yes, pt. Lives in group home for the last two years and will return to live there after d/c.       2. Would you like a referral for services when you are discharged? Yes:     If yes, for what services?  No:       No, Pt. does not want a referral.       Summary and Recommendations (to be completed by the evaluator) Pt. Is a 56 year old male admitted for Depression and SI thoughts. The pt. Reports problems with his medication after it was switched due to the pt. Not being able to afford the medications. Pt. Recommendations include Crisis Stabilization, Case management, Group therapy, and Medication management.                       Signature:  Neila Gear, 05/05/2011 2:53 PM

## 2011-05-05 NOTE — Progress Notes (Signed)
Suicide Risk Assessment  Admission Assessment     Demographic factors:   LIVES ALONE Current Mental Status:    Mood sad, calm, no si, nohi, no avh currently  Loss Factors:   unable to work, separated from wife, loosing his benefits, on wheelchair Historical Factors:   hx of depression in past for the last many years Risk Reduction Factors:   out pt care, able to see his son and family  CLINICAL FACTORS:   Depression:   Hopelessness  COGNITIVE FEATURES THAT CONTRIBUTE TO RISK:  Closed-mindedness    SUICIDE RISK:   Moderate:  Frequent suicidal ideation with limited intensity, and duration, some specificity in terms of plans, no associated intent, good self-control, limited dysphoria/symptomatology, some risk factors present, and identifiable protective factors, including available and accessible social support.  PLAN OF CARE:  1. Admitting for safety  2. Adjusting meds as needed to treat underlying depression Wonda Cerise 05/05/2011, 5:25 PM

## 2011-05-05 NOTE — Progress Notes (Signed)
[facility] Adult Inpatient Family/Significant Other Suicide Prevention Education  Suicide Prevention Education:  Education Completed; Jinny Sanders- Group Home [phone]- has been identified by the patient as the family member/significant other with whom the patient will be residing, and identified as the person(s) who will aid the patient in the event of a mental health crisis (suicidal ideations/suicide attempt).  With written consent from the patient, the family member/significant other has been provided the following suicide prevention education, prior to the and/or following the discharge of the patient.  The suicide prevention education provided includes the following:  Suicide risk factors  Suicide prevention and interventions  National Suicide Hotline telephone number  [facility] assessment telephone number  [facility] Emergency Assistance 911  [facility] and/or Residential Mobile Crisis Unit telephone number  Request made of family/significant other to:  Remove weapons (e.g., guns, rifles, knives), all items previously/currently identified as safety concern.    Remove drugs/medications (over-the-counter, prescriptions, illicit drugs), all items previously/currently identified as a safety concern.Pt. stated that the group home is safe and there is no  weapons . Pt.'s medications are given to him and are controlled by the staff at the group home.    The family member/significant other verbalizes understanding of the suicide prevention education information provided.  The family member/significant other agrees to remove the items of safety concern listed above.  Lamar Blinks [place] 05/05/2011, 6:16 PM

## 2011-05-05 NOTE — ED Notes (Signed)
SITTER AT BEDSIDE

## 2011-05-05 NOTE — H&P (Signed)
  Identifying information: This is a 56 year old Caucasian male, divorced, this is a voluntary admission.  History of present illness:  [name] presents by way of our emergency room where he can was complaining of suicidal thoughts. He reports his depression has been getting much worse for the past 2-3 months, insights recent stressors of dealing with worsening multiple sclerosis, and and depression over his divorce in July of this past year. For the past 3 days he been formulating a suicidal plan of obtaining over-the-counter sleeping pills from a local drugstore and overdosing on them. He denies any substance abuse.  He denies any suicidal thoughts today. His stepfather, with whom he is close, is being buried today.  Past psychiatric history:  Most recently admitted to the behavioral health hospital in June of 2012. At that time we continued his Celexa and start him on Effexor XR 75 mg daily. He is currently followed as an outpatient by [facility], here in [place]. He obtains his medications from the [facility] pharmacy.  Social history: Divorced occasion male, he's been divorced after 13 years of marriage, effective in June of this past year. He continues to see his 43 year old son who lives here in [place], visits with him about 3 times monthly. He previously worked in the [employer]. He is disabled with multiple sclerosis and receives disability income. No legal problems. He lives at [facility]..  Family history: Denies family history of mental illness or substance abuse.  Medical evaluation: I've medically and physically evaluated this patient and my findings are consistent with the emergency room exam of January 11. CBC and metabolic panel were noted to be normal. Alcohol and urine drug screens, were found to be normal. This is a normally developed Caucasian male who appears adequately nourished and hydrated, with no abnormal movements. He is able to stand  and bear weight but has been using a wheelchair here on the unit. He reports that ambulation aggravates back pain as a result of his multiple sclerosis.  Chronic medical problems are multiple sclerosis followed by Dr. Epimenio Foot MD, his neurologist, in [facility].   Admitting medications: Wellbutrin SR 100 mg, one and one half tabs daily the morning. Amantadine 100 mg twice a day. Celexa 40 mg by mouth daily, for depression Risperdal 2 mg by mouth each bedtime, for depression. Hydroxyzine 50 mg at bedtime, for insomnia.  Mental status exam: Fully alert male pleasant cooperative, with a blunted affect, he is calm pleasant and polite. He is a coherent history. Denies any suicidal intent or plans today. Thinking is goal directed and nonpsychotic. Judgment insight and impulse control all appear normal. Fund of knowledge is adequate intelligence is at least average.  Admitting diagnosis: Axis I: Maj. depression recurrent severe Axis II: Deferred. Axis III: Multiple sclerosis Axis IV: Social isolation and significant medical problems. Axis V: Current 40 past year not known.  Plan: We'll continue his routine medications at this time and consider switching back to the Effexor.

## 2011-05-06 LAB — TSH: TSH: 2.257 u[IU]/mL (ref 0.350–4.500)

## 2011-05-06 NOTE — Progress Notes (Signed)
[facility] Group Notes:  (Counselor/Nursing/MHT/Case Management/Adjunct)  05/06/2011 6:32 PM  Type of Therapy:  Group Therapy  Participation Level:  Active  Participation Quality:  Appropriate  Affect:  Appropriate  Cognitive:  Appropriate  Insight:  Good  Engagement in Group:  Good  Engagement in Therapy:  Good  Modes of Intervention:  Clarification and Support  Summary of Progress/Problems: Pt. Participated in group discussion on supports that are healthy and that are unhealthy and each pt. Shared what support they had in their lives and how that person supports them. Each pt. Was encouraged by the therapist to find many healthy supports that they could in order to have a successful recovery. Pt. States he has a support group but does not have any supports. Pt. Was encouraged to seek support from other sources. Pt. Seemed unwilling to do this but he was encouraged by the therapist.  Neila Gear 05/06/2011, 6:32 PM

## 2011-05-06 NOTE — Progress Notes (Addendum)
Pt in milieu this evening, ambulates in wheelchair. Keeps to self but did attend group. On 1:1 he is hopeless, flat, depressed. Verbalizes frustration with his chronic pain. States it's (his legs) "always an 8/10 though giving it a number is pointless."Given support. Medicated per orders without difficulty. No prn's. He endorses passive SI but verbally contracts for safety. Denies HI and remains safe. [name]

## 2011-05-06 NOTE — Progress Notes (Signed)
Pt. States that he is glad that he came to the hospital. He was feeling suicidal at home and knew that he needed help. Presently denies SI and HI. States, however if he were to leave the hospital that he might act on his thoughts of suicide. Pt states that he is really having a lot of trouble not being able to get his medications from the [place]. Given support, reassurance and praise.

## 2011-05-06 NOTE — Progress Notes (Signed)
Pt has been visible around milieu. Is pleasant, cooperative. States pain is slightly better today though is always present. Affect remains flat with congruent mood. Supported, encouraged. Medicated per orders without difficulty. No prn's requested. He endorses passive SI but is able to contract for safety. Pt remains safe. [name]

## 2011-05-06 NOTE — Progress Notes (Signed)
Patient ID: [name], male   DOB: [birth year], 56 y.o.   MRN: [id number]    Interval Hx:    Sleep is ok.  appetite is fair. Reports frequent suidicide thoughts without any specific plans at this time. reports being upset about multiple stressors including not getting his meds from [place] currently..      Mental status exam:    Fully alert male cooperative, with a blunted affect, he is calm pleasant and polite. He is a coherent history . Positive suicide thoughts but  No plans today. Thinking is goal directed and nonpsychotic. Judgment insight and impulse control all appear limited. Fund of knowledge is adequate intelligence is at least average.     Diagnosis:  Axis I: Maj. depression recurrent severe  Axis II: Deferred.  Axis III: Multiple sclerosis  Axis IV: Social isolation and significant medical problems. V: 30     Plan:  1. Continue current meds

## 2011-05-06 NOTE — Progress Notes (Signed)
[facility] Group Notes:  (Counselor/Nursing/MHT/Case Management/Adjunct)  05/06/2011 6:39 PM  Type of Therapy:  After Care group  Pt. Was given [facility] SI information and crisis and hotline numbers. Pt. Agreed to use them if needed.   Neila Gear 05/06/2011, 6:39 PM

## 2011-05-06 NOTE — Progress Notes (Signed)
  Called the [facility] [phone] [id number] to check on this patients benefit status. Was in the [facility] [phone] -no combat  He sees Thresa Ross PA in the [facility] clinic. He is Not Service Connected -this means his Medicare/Medicaid will pay for his MS medicine.  Mickie Deery Simrin Vegh PA-C

## 2011-05-07 NOTE — Progress Notes (Signed)
Slept well last nite, appetite is good, energy level is normal and ability to pay attention is good, depressed at 7/20 and hopeless is 7/10, denies Hi and SI is off and on but contracts for safety, attending group and interacts w/peers, is pleasant and cooperative, taking meds as ordered q61min safety checks continues and support offered Safety maintained

## 2011-05-07 NOTE — Tx Team (Signed)
Interdisciplinary Treatment Plan Update (Adult)  Date:  05/07/2011  Time Reviewed:  11:04 AM   Progress in Treatment: Attending groups:   Yes   Participating in groups:  Yes Taking medication as prescribed:  Yes Tolerating medication:  Yes Family/Significant othe contact made: Contact to be made with group home owner Patient understands diagnosis:  Yes Discussing patient identified problems/goals with staff: Yes Medical problems stabilized or resolved: Yes Denies suicidal/homicidal ideation:Yes Issues/concerns per patient self-inventory:   None identified Other:  New problem(s) identified:  Reason for Continuation of Hospitalization: Anxiety Depression Medication stabilization Suicidal ideation  Interventions implemented related to continuation of hospitalization:  Medication Management; safety checks q 15 mins  Additional comments:  Estimated length of stay:  Discharge Plan:  New goal(s):  Review of initial/current patient goals per problem list:    1.  Goal(s):.Eliminate SI   Met:  No  Target date:  d/c  As evidenced by: Will no longer endorse SI.  Endorsing off/on SI today  2.  Goal (s): .Reduce depression   Met:  Yes  Target date:  d/c  As evidenced by:  Will rate depression less than current seven on scale  3.  Goal(s):  Schedule outpatient f/u  Met:  No  Target date: d/c  As evidenced by: Follow up appointment will be scheduled  4.  Goal(s):  Refer for outpatient follow up  Met:  No  Target date: d/c  As evidenced by:  Outpatient follow up to be in place  Attendees: Patient:  Glenmore Karl 05/07/2011  11:11 AM   Family:     Physician:  05/07/2011 11:04 AM   Nursing:  Neill Loft, RN   05/07/2011 11:04 AM   CaseManager:  Juline Patch, LCSW 05/07/2011 11:04 AM   Counselor:  Angus Palms, LCSW 05/07/2011 11:04 AM   Other:  Consuello Bossier, NP 05/07/2011 11:04 AM   Other:  Reyes Ivan, LCSWA 05/07/2011  11:04 AM   Other:     Other:        Scribe for Treatment Team:   [name], LCSW,  05/07/2011 11:04 AM
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
## 2011-05-07 NOTE — Progress Notes (Signed)
[facility] Group Notes: (Counselor/Nursing/MHT/Case Management/Adjunct) 05/07/2011   @  11:00am   Type of Therapy:  Group Therapy  Participation Level:  Limited  Participation Quality:  Attentive, Appropriate  Affect:  Blunted  Cognitive:  Appropriate  Insight:  Limited  Engagement in Group: Limited  Engagement in Therapy:  Limited  Modes of Intervention:  Support and Exploration  Summary of Progress/Problems: [name] was attentive and engaged though he he did not share what he believes is at the "root" of his mental health problems. He did seem to relate well to others in the group who shared.   [name] 05/07/2011 2:42 PM

## 2011-05-07 NOTE — Progress Notes (Signed)
[facility] MD Progress Note  05/07/2011 5:26 PM  "I feel discouraged and frustrated. I am still doing the same thing over and over again. I live in this group home. There is hardly anything to do. There were no activities to occupy some of the times.  [facility] that I need to help slow the progression of my disease MS. The medicine is called Gilenya. I got my neurologist to assist me to get it from the drug company that make this drug. As to what I am planning to get from this my latest admission here, I don't have an answer to the question"  Diagnosis:   Axis I: Major depressive disorder, recurrent Axis II: Deferred Axis III:  Past Medical History  Diagnosis Date  . Mental disorder   . Depression   . Neuromuscular disorder   . Multiple sclerosis, relapsing-remitting    Axis IV: No changes Axis V: 41-50 serious symptoms  ADL's:  intact  Sleep:  Yes,  AEB: 6.75 hopurs  Appetite:  Yes,  AEB: "Fair"  Suicidal Ideation: "Off and on"  Plan:  No  Intent:  No  Means:  No  Homicidal Ideation:   Plan:  No  Intent:  No  Means:  No  AEB (as evidenced by): Per patient's report Mental Status: General Appearance Luretha Murphy:  Casual Eye Contact:  Good Motor Behavior:  Normal Speech:  Normal Level of Consciousness:  Alert Mood:  Depressed and Irritable Affect:  Blunt Anxiety Level:  Minimal Thought Process:  Coherent Thought Content:  WNL Perception:  Normal Judgment:  Fair Insight:  Present Cognition:  Orientation time, place and person Sleep:  Number of Hours: 6.75   Vital Signs:Blood pressure 87/54, pulse 105, temperature 97.8 F (36.6 C), temperature source Oral, resp. rate 16.  Lab Results:  Results for orders placed during the hospital encounter of 05/05/11 (from the past 48 hour(s))  TSH     Status: Normal   Collection Time   05/05/11  7:55 PM      Component Value Range Comment   TSH 2.257  0.350 - 4.500 (uIU/mL)      Physical Findings: AIMS:  , ,  ,  ,    CIWA:    COWS:     Treatment Plan Summary: Daily contact with patient to assess and evaluate symptoms and progress in treatment Medication management  Plan: Continue current treatment plan.           Continue group counseling and activities  Armandina Stammer I 05/07/2011, 5:26 PM

## 2011-05-08 DIAGNOSIS — F339 Major depressive disorder, recurrent, unspecified: Secondary | ICD-10-CM

## 2011-05-08 NOTE — Progress Notes (Signed)
[facility] Group Notes: (Counselor/Nursing/MHT/Case Management/Adjunct) 05/08/2011   @1 :15pm  Type of Therapy:  Group Therapy  Participation Level:  Limited  Participation Quality:  Attentive, Appropriate  Affect:  Blunted  Cognitive:  Appropriate  Insight:  None  Engagement in Group: Limited  Engagement in Therapy:  None  Modes of Intervention:  Support, Education, and Exploration  Summary of Progress/Problems: Garyn was very attentive and seemed to relate well to others' statements about dependent and co-dependent relationships, though he did not share personally.   [name] 05/08/2011  2:29 PM

## 2011-05-08 NOTE — Progress Notes (Signed)
Grief and Loss Group  Group focused on losses related to personal experiences, relationships and identity. There was positive dynamics between group members with most participating in the discussion and providing support to each other. Provided a framework for understanding their experiences as losses and to identify how their grief was being experienced and expressed. Explored some alternatives to dealing with losses and encourage group to continue to explore how to value themselves and believe in their right to have a healthier life.   Pt was active in group and identified multiple losses related to his health ( MS ) divorce, loss of ability to work. He related to others regarding the struggle with self affirmation given a history of family instability.   [name].Div [facility]

## 2011-05-08 NOTE — Progress Notes (Signed)
Patient ID: [name], male   DOB: 11/14/1955, 56 y.o.   MRN: [id number] Pt. Is irritable, reports 'Discouraged today of the whole situation". Pt. Reports depression at 4-5 out of 10.  Pt. deinies SHI and AVH. Pt. Did attend group and plays cards in day room. Pt. Headed to laundry to get clean clothes. Staff will continue to monitor q31min for safety.

## 2011-05-08 NOTE — Progress Notes (Signed)
Slept well last nite, appetite is good, energy level is normal, ability to pay attention is good, depressed 4/10 and hopeless 7/10, is SI off and on but contracts, taking meds as ordered, going to group and interacting w/peers q19min safety checks continue and support offered safeety maintained

## 2011-05-08 NOTE — Progress Notes (Signed)
[facility] MD Progress Note  05/08/2011 4:38 PM  "I slept well last night. I still feel frustrated, but I am not sitting around dwelling on it".  Diagnosis:   Axis I: Major depressive disorder, recurrent Axis II: Deferred Axis III:  Past Medical History  Diagnosis Date  . Mental disorder   . Depression   . Neuromuscular disorder   . Multiple sclerosis, relapsing-remitting    Axis IV: No changes Axis V: 51-60 moderate symptoms  ADL's:  Intact  Sleep:  Yes,  AEB: "I slept well last night"  Appetite:  Yes,  AEB: per patient's report.  Suicidal Ideation:   Plan:  No  Intent:  No  Means:  No  Homicidal Ideation:   Plan:  No  Intent:  No  Means:  No  AEB (as evidenced by): Per patient's report.  Mental Status: General Appearance Luretha Murphy:  Casual Eye Contact:  Good Motor Behavior:  Normal Speech:  Normal Level of Consciousness:  Alert Mood:  "I feel all right" Affect:  Blunt Anxiety Level:  Minimal Thought Process:  Coherent Thought Content:  WNL Perception:  Normal Judgment:  Fair Insight:  Present Cognition:  Orientation time, place and person Sleep:  Number of Hours: 5.75   Vital Signs:Blood pressure 128/80, pulse 90, temperature 98.3 F (36.8 C), temperature source Oral, resp. rate 20.  Lab Results: No results found for this or any previous visit (from the past 48 hour(s)).  Physical Findings: AIMS:  , ,  ,  ,    CIWA:    COWS:     Treatment Plan Summary: Daily contact with patient to assess and evaluate symptoms and progress in treatment Medication management  Plan: Continue current treatment plan.  Armandina Stammer I 05/08/2011, 4:38 PM

## 2011-05-08 NOTE — Progress Notes (Signed)
Patient ambulates on wheel chair d/t his history of ms. He reports feeling depressed and hopeless. His mood and affect flat and depressed. He denied suicide ideation. Q 15 minutes check continues as ordered to maintain safety.

## 2011-05-08 NOTE — Progress Notes (Signed)
[facility] Group Notes: (Counselor/Nursing/MHT/Case Management/Adjunct) 05/08/2011   @  11:00am   Type of Therapy:  Group Therapy  Participation Level:  Minimal   Participation Quality:  Attentive  Affect:  Blunted  Cognitive:  Appropriate  Insight:  None  Engagement in Group: Minimal  Engagement in Therapy:  None  Modes of Intervention:  Support and Exploration  Summary of Progress/Problems: Americo seemed to relate to experiences of others who shared but did not process his personal experiences with getting out of unhealthy patterns.  [name] 05/08/2011  2:14 PM
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
## 2011-05-09 NOTE — Progress Notes (Signed)
Patient ID: [name], male   DOB: [birth year], 56 y.o.   MRN: [id number] Pt is awake and active on the unit this AM. Pt denies SI/HI and AVH. Pt affect is flat and mood is depressed. Pt c/o feeling hopeless due to his lifestyle in assisted living facility. Pt says that he feels like he is "waiting around to die" while he is there. Writer offered self and suggested yoga as a means to mediate symptoms of MS per MD, and to get more involved in the community [employer] if possible. Pt feels frustrated at his facility due to the food quality and he believes that the director is very cheap and does not provide all that she could. Pt states that he will attempt yoga and attend groups during stay. Writer will continue to monitor.

## 2011-05-09 NOTE — Progress Notes (Signed)
Pt presents this evening with no concerns. Pt interacts with others well on the unit. Pt attended this evenings wrap-up group. Pt has been calm and cooperative throughout this shift. Support and availabilty as needed has been extended to this pt. Pt safety remains with q36min checks.

## 2011-05-09 NOTE — Progress Notes (Signed)
[facility] Group Notes: (Counselor/Nursing/MHT/Case Management/Adjunct) 05/09/2011   @  11:00am   Type of Therapy:  Group Therapy  Participation Level:  Active  Participation Quality:  Attentive, Sharing  Affect: Blunted  Cognitive:  Appropriate  Insight:  Limited  Engagement in Group: Good  Engagement in Therapy:  Limited  Modes of Intervention:  Support and Exploration  Summary of Progress/Problems: [name] processed his experience of anxiety. He stated that his anxiety often comes from nowhere and is overwhelming to him. [name] was open to ideas of ways to reduce stressors for anxiety , but stated that he doesn't see how things will change in his life.   [name] 05/09/2011 2:42 PM

## 2011-05-09 NOTE — Progress Notes (Signed)
Pt ambulates with his wheel chair d/t history of MS. He continues to appear flat and depressed. Although denied any suicide intent.  Some interactions noted between patient and peers. Q 15 minute check continues as ordered to maintain safety.

## 2011-05-09 NOTE — Progress Notes (Signed)
Pt attended discharge planning group and actively participated.  Pt presents with flat affect and depressed mood.  Pt ranks depression at a 4 and denies anxiety.  Pt reports SI but contracts for safety.  Pt states he is frustrated he has to keep coming back to the hospital for depression.  Pt states he needs to come up with a better plan to commit suicide because the plan he did didn't work.  Pt states right now suicide sounds like a good idea.  Pt states he is staying at a group home and annoyed because the food is bad, but doesn't want to leave because he likes the people there.  No further needs at this time.   Reyes Ivan, LCSWA 05/09/2011  1:35 PM

## 2011-05-09 NOTE — Progress Notes (Signed)
Recreation Therapy Notes  05/09/2011         Time: 1430      Group Topic/Focus: The focus of the group is on enhancing the patients' ability to cope with stressors. Patients participated in a relaxation exercise with components of guided imagery, progressive muscle relaxation, and positive affirmations.   Participation Level: Active  Participation Quality: Resistant  Affect: Blunted  Cognitive: Oriented   Additional Comments: Patient stared at RT the entire group, didn't appear to be practicing the techniques.   [name] 05/09/2011 4:15 PM

## 2011-05-09 NOTE — Progress Notes (Signed)
[facility] MD Progress Note  05/09/2011 4:18 PM  "I feel like everybody is asking me how I am doing for me to tell you what you want to hear. I tell you, I am frustrated and discouraged. I feel suicidal just to take myself out of this misery. Will I act on it, the answer is no, I don't think that I can"  Diagnosis:   Axis I: Major depressive disorder, recurrent Axis II: Deferred Axis III:  Past Medical History  Diagnosis Date  . Mental disorder   . Depression   . Neuromuscular disorder   . Multiple sclerosis, relapsing-remitting    Axis IV: No changes Axis V: 51-60 moderate symptoms  ADL's:  Intact  Sleep:  Yes,  AEB: 5.5  Appetite:  Yes,  AEB: per patient's report  Suicidal Ideation: "yes".   Plan:  No  Intent:  No  Means:  No  Homicidal Ideation:   Plan:  No  Intent:  No  Means:  No  AEB (as evidenced by): per patient's report  Mental Status: General Appearance Luretha Murphy:  Casual Eye Contact:  Good Motor Behavior:  Normal Speech:  Normal Level of Consciousness:  Alert Mood:  Depressed Affect:  Blunt Anxiety Level:  Minimal Thought Process:  Tangential Thought Content:  Obsessions Perception:  Normal Judgment:  Fair Insight:  Present Cognition:  Orientation time, place and person Sleep:  Number of Hours: 5.75   Vital Signs:Blood pressure 89/61, pulse 101, temperature 99.5 F (37.5 C), temperature source Oral, resp. rate 18.  Lab Results: No results found for this or any previous visit (from the past 48 hour(s)).  Physical Findings: AIMS:  , ,  ,  ,    CIWA:    COWS:     Treatment Plan Summary: Daily contact with patient to assess and evaluate symptoms and progress in treatment Medication management  Plan: Continue current treatment plan.  Armandina Stammer I 05/09/2011, 4:18 PM

## 2011-05-10 DIAGNOSIS — F329 Major depressive disorder, single episode, unspecified: Secondary | ICD-10-CM

## 2011-05-10 MED ORDER — BUPROPION HCL ER (XL) 150 MG PO TB24
150.0000 mg | ORAL_TABLET | Freq: Every day | ORAL | Status: DC
  Administered 2011-05-10 – 2011-05-11 (×2): 150 mg via ORAL
  Filled 2011-05-10: qty 5
  Filled 2011-05-10 (×4): qty 1

## 2011-05-10 NOTE — Progress Notes (Signed)
[facility] Group Notes:  (Counselor/Nursing/MHT/Case Management/Adjunct)  05/10/2011 3:10 PM  Type of Therapy:  Group Therapy  Participation Level:  Active  Participation Quality:  Appropriate and Attentive  Affect:  Appropriate  Cognitive:  Alert and Appropriate  Insight:  Limited  Engagement in Group:  Limited  Engagement in Therapy:  Limited  Modes of Intervention:  Education and Exploration  Summary of Progress/Problems: Patient participated in relaxation technique using Love and Care Meditation.   Wilmon Arms 05/10/2011, 3:10 PM  Cosigned by: Angus Palms, LCSW

## 2011-05-10 NOTE — Progress Notes (Signed)
Lying quietly in bed with eyes closed.  Q 15 min. checks in progress to maintain safety.

## 2011-05-10 NOTE — Tx Team (Signed)
Interdisciplinary Treatment Plan Update (Adult)  Date:  05/10/2011  Time Reviewed:  10:44 AM   Progress in Treatment: Attending groups:   Yes   Participating in groups:  Yes Taking medication as prescribed:  Yes Tolerating medication:  Yes Family/Significant othe contact made: Contact made with Ms. Earlene Plater who will transport patient for return to the rest home Patient understands diagnosis:  Yes Discussing patient identified problems/goals with staff: Yes Medical problems stabilized or resolved: Yes Denies suicidal/homicidal ideation:Yes Issues/concerns per patient self-inventory:   None identified Other:  New problem(s) identified:  Reason for Continuation of Hospitalization: Depression Medication stabilization  Interventions implemented related to continuation of hospitalization:  Medication Management; safety checks q 15 mins  Additional comments:  Estimated length of stay: 1-2 days  Discharge Plan:  Return to rest home - follow up with [facility] Care  New goal(s):  Review of initial/current patient goals per problem list:   1.  Goal(s):  Met:  No  Target date:  As evidenced by:  2.  Goal (s):  Met:  Yes  Target date:  As evidenced by:  3.  Goal(s):  Met:  Yes  Target date:  As evidenced by:  4.  Goal(s):  Met:  Yes  Target date:  As evidenced by:  Attendees:  Patient:  [name] 05/11/2011  1:02 PM   Family:     Physician:  Orson Aloe, MD 05/10/2011 10:44 AM   Nursing:   Cammy Brochure, RN 05/10/2011 10:44 AM   CaseManager:  Juline Patch, LCSW 05/10/2011 10:44 AM   Counselor:  Angus Palms, LCSW 05/10/2011 10:44 AM   Other:  Consuello Bossier, NP 05/10/2011 10:44 AM   Other:  Reyes Ivan, LCSWA 05/10/2011  10:44 AM   Other:  Alcide Clever, MSW Intern 05/10/2011  10:45 AM   Other:      Scribe for Treatment Team:   [name], LCSW,  05/10/2011 10:44 AM

## 2011-05-10 NOTE — Progress Notes (Signed)
Pt seen in the consult room.  Reviewed some of his history.  He reports that he is pretty content where he lives except for the food that the woman in charge of the group home is so cheap that she could squeeze a dollar so tight that she could make [facility] squeal.  His says that his mood is still pretty low, but actually he reported to the case manager that his Depression was a 2 to 3 and his anxiety was 0.  He has hopelessness and helplessness at 7 or 8.  He has no SI.  He adds that it still sounds like a good idea.  He says that all he does is sit around watching TV and waiting to die.  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  Will shift to Welbutrin XL as he says that his mood takes a dip in the afternoons and see if that helps.

## 2011-05-10 NOTE — Progress Notes (Signed)
Patient stayed in his room most of the day.  Did come out for groups, medications, and meals.  Little interaction with peers or staff.  Has been pleasant and cooperative, but sad.

## 2011-05-10 NOTE — Progress Notes (Signed)
[facility] Group Notes:  (Counselor/Nursing/MHT/Case Management/Adjunct)  05/10/2011 2:26 PM  Type of Therapy:  Group Therapy  Participation Level:  Minimal  Participation Quality:  Appropriate and Attentive  Affect:  Appropriate  Cognitive:  Alert and Appropriate  Insight:  Limited  Engagement in Group:  Limited  Engagement in Therapy:  Limited  Modes of Intervention:  Activity, Clarification, Education and Exploration  Summary of Progress/Problems: Patient described a balanced life as having a stable work place.    Wilmon Arms 05/10/2011, 2:26 PM Cosigned by: Angus Palms, LCSW

## 2011-05-11 DIAGNOSIS — F341 Dysthymic disorder: Principal | ICD-10-CM

## 2011-05-11 MED ORDER — BUPROPION HCL ER (XL) 150 MG PO TB24: 150.0000 mg | ORAL_TABLET | Freq: Every day | ORAL | Status: DC

## 2011-05-11 NOTE — Discharge Summary (Signed)
Patient ID: [name] MRN: [id number] DOB/AGE: Jan 16, 1956 56 y.o.  Admit date: 05/05/2011 Discharge date: 05/11/2011  Reason for Admission: Suicidal ideation   Hospital Course: Develle presents by way of our emergency room where he can was complaining of suicidal thoughts. He reports his depression has been getting much worse for the past 2-3 months, insights recent stressors of dealing with worsening multiple sclerosis, and and depression over his divorce in July of this past year. For the past 3 days he been formulating a suicidal plan of obtaining over-the-counter sleeping pills from a local drugstore and overdosing on them. He denies any substance abuse. While a patient here at [facility], patient is observed daily sitting up in a wheelchair. He always has flat affect and complained that he is very frustrated and discouraged with the system because he believed that veteran administration is not assisting him in getting the medication he needs to retard the progression of his multiple sclerosis. However today during discharge meeting, patient states, "I believe I am my own problem because I can't get off my butt to do something" He blamed that his inactivity is also due to the group home where he is residing does not have any planned activities for their residents. He states that he liked living there, rather does not care much about their food. He received medication management for his depression. Participated in group counseling. He will follow-up on an out patient basis at [facility] of care with Stevphen Rochester. Patient is discharged to his group home with all belongings.   Discharge Diagnoses:  AXIS I Dysthymic Disorder  AXIS II Deferred  AXIS III Past Medical History  Diagnosis Date  . Mental disorder   . Depression   . Neuromuscular disorder   . Multiple sclerosis, relapsing-remitting     AXIS IV problems with primary support group  AXIS V 61-70 mild symptoms   Condition on   Discharge: Suicidal Ideation:  Plan:  No Intent:  No Means:  No  Homicidal Ideation:  Plan:  No Intent:  No Means:  No  Mental Status: General Appearance /Behavior:  Neat Eye Contact:  Good Motor Behavior:  Normal Speech:  Normal Level of Consciousness:  Alert Mood:  2 to 3 on a scale of 1 is the least and 10 is the most Affect:  Appropriate Anxiety Level:  0 on a scale of 1 is the least and 10 is the most Thought Process:  Coherent Thought Content:  WNL Perception:  Normal Judgment:  Good Insight:  Present Cognition:  Orientation time, place and person Concentration Yes Sleep:  Number of Hours: 6.5   Vital Signs:Blood pressure 90/62, pulse 92, temperature 97.3 F (36.3 C), temperature source Oral, resp. rate 18. Lab Results:No results found for this or any previous visit (from the past 48 hour(s)).   Reports that he has learned from this hospital stay that he is his biggest stumbling block to his progress.  He was challenged to go to some activities at the [facility] which included exercise groups and yoga.  Risk of harm to self is elevated from his state of chronic suicidal thinking, but no plans.  He is challenged to find something different to do to get different results  Risk of harm to others minimal in that he has not been involved in fights and has never had any legal charges for any misconduct.  Plan:  Medication List  As of 05/11/2011  3:52 PM   STOP taking these medications  WELLBUTRIN 100 MG tablet         TAKE these medications         amantadine 100 MG capsule   Commonly known as: SYMMETREL   Take 100 mg by mouth 2 (two) times daily.      buPROPion 150 MG 24 hr tablet   Commonly known as: WELLBUTRIN XL   Take 1 tablet (150 mg total) by mouth daily. For depression      citalopram 40 MG tablet   Commonly known as: CELEXA   Take 40 mg by mouth daily.      GILENYA 0.5 MG Caps   Generic drug: Fingolimod HCl   Take 0.5 capsules by mouth daily.       hydrOXYzine 50 MG capsule   Commonly known as: VISTARIL   Take 50 mg by mouth at bedtime.      risperiDONE 2 MG tablet   Commonly known as: RISPERDAL   Take 2 mg by mouth at bedtime.           Follow-up Information    Follow up with Marrian Salvage -  [facility]. Call on 05/17/2011. (Your appointment with Marrian Salvage is scheudled for Thurday, May 17, 2011 at 1:00 p.m.)    Contact information:   [address] [place], [place]  [address]  [phone]        Signed: Orson Aloe 05/11/2011, 3:52 PM

## 2011-05-11 NOTE — Progress Notes (Signed)
Patient ID: [name], male   DOB: 1955-11-10, 56 y.o.   MRN: [id number] Writer reviewed discharge instructions with pt including medications, follow up appointments and crisis intervention. Pt verbally acknowledged understanding of all instructions. Pt denies SI/HI and states that he feels prepared to discharge at this this time. Pt mood and affect are appropriate to the situation. Pt belongings returned from locker and he is released with transportation to his nursing home facility.

## 2011-05-11 NOTE — Tx Team (Signed)
Interdisciplinary Treatment Plan Update (Adult)  Date:  05/11/2011  Time Reviewed:  10:29 AM   Progress in Treatment: Attending groups: Yes Participating in groups:  Yes, present and sharing in groups Taking medication as prescribed:  Yes Tolerating medication: Yes Family/Significant othe contact made:  Yes, contact made with: group home owner, Jinny Sanders Patient understands diagnosis: Yes Discussing patient identified problems/goals with staff:  Yes Medical problems stabilized or resolved: Yes Denies suicidal/homicidal ideation: No (chronic) Issues/concerns per patient self-inventory:  No  Other:  New problem(s) identified: None  Reason for Continuation of Hospitalization: Appropriate for discharge today   Interventions implemented related to continuation of hospitalization:  Medication stabilization, safety checks q 15 mins, group attendance  Additional comments:  Estimated length of stay: discharge today  Discharge Plan: Discharge to [facility] and follow up with [facility]  New goal(s):  Review of initial/current patient goals per problem list:   1.  Goal(s):Eliminate SI  Met:  No  Target date: by discharge  As evidenced by: Cnronic suicidal thoughts - Ehsan reports not plan or intent today  2.  Goal (s): Reduce depressive symptoms  Met:  Yes  Target date: by discharge  As evidenced by: Jonny Ruiz rates depression at a 2 (decreased from 10 at discharge)  3.  Goal(s):Schedule outpatient followup  Met:  Yes  Target date: by discharge  As evidenced by:  Followup appointments set with [facility]  4.  Goal(s): Medication stabilization  Met:  Yes  Target date: by discharge  As evidenced by: Jonny Ruiz reports medications are working and no intolerable side effects  Attendees: Patient:   05/11/2011 10:29 AM  Family:   05/11/2011 10:29 AM  Physician:  Dr Orson Aloe, MD 05/11/2011 10:29 AM  Nursing:   Cato Mulligan, RN 05/11/2011  10:29 AM  Case Manager:  Juline Patch, LCSW 05/11/2011 10:29 AM  Counselor:  Angus Palms, LCSW 05/11/2011 10:29 AM  Other:  Reyes Ivan, LCSWA 05/11/2011 10:29 AM  Other:  Serena Colonel, NP 05/11/2011 10:29 AM  Other:  Omelia Blackwater, RN 05/11/2011 10:29 AM  Other:   05/11/2011 10:29 AM   Scribe for Treatment Team:   [name], 05/11/2011 10:29 AM

## 2011-05-11 NOTE — Progress Notes (Signed)
[facility] Case Management Discharge Plan:  Will you be returning to the same living situation after discharge: Yes,  [name] will return to [facility] At discharge, do you have transportation home?:Yes,  To be transported by [facility] or [facility] Do you have the ability to pay for your medications:Yes,  Patient has Medicare/Medicaid  Interagency Information:     Release of information consent forms completed and in the chart;  Patient's signature needed at discharge.  Patient to Follow up at:    Patient denies SI/HI:   Yes,  Endorses SI but no plan    Safety Planning and Suicide Prevention discussed:  Yes,  Reviewed during groups  Barrier to discharge identified:No.  Summary and Recommendations: Patient to return to [facility].  Recommended that patient get involved with activities, exercise program, outside of the home  [name] 05/11/2011, 10:47 AM

## 2011-05-11 NOTE — [facility] Suicide Risk Assessment (Signed)
Suicide Risk Assessment  Discharge Assessment     Demographic factors:    Current Mental Status:    Risk Reduction Factors:     CLINICAL FACTORS:   Dysthymia Previous Psychiatric Diagnoses and Treatments Medical Diagnoses and Treatments/Surgeries  COGNITIVE FEATURES THAT CONTRIBUTE TO RISK:  Closed-mindedness Thought constriction (tunnel vision)    SUICIDE RISK:   Minimal: No identifiable suicidal ideation.  Patients presenting with no risk factors but with morbid ruminations; may be classified as minimal risk based on the severity of the depressive symptoms  Suicidal Ideation:   Plan:  No  Intent:  No  Means:  No  Homicidal Ideation:   Plan:  No  Intent:  No  Means:  No  Mental Status: General Appearance /Behavior:  Neat Eye Contact:  Good Motor Behavior:  Normal Speech:  Normal Level of Consciousness:  Alert Mood:  2 to 3 on a scale of 1 is the least and 10 is the most Affect:  Appropriate Anxiety Level:  0 on a scale of 1 is the least and 10 is the most Thought Process:  Coherent Thought Content:  WNL Perception:  Normal Judgment:  Good Insight:  Present Cognition:  Orientation time, place and person Concentration Yes Sleep:  Number of Hours: 6.5   Vital Signs:Blood pressure 90/62, pulse 92, temperature 97.3 F (36.3 C), temperature source Oral, resp. rate 18. Lab Results:No results found for this or any previous visit (from the past 48 hour(s)).   Reports that he has learned from this hospital stay that he is his biggest stumbling block to his progress.  He was challenged to go to some activities at the [facility] which included exercise groups and yoga.  Risk of harm to self is elevated from his state of chronic suicidal thinking, but no plans.  He is challenged to find something different to do to get different results  Risk of harm to others minimal in that he has not been involved in fights and has never had any legal charges for any  misconduct.  Continue Medication List  As of 05/11/2011 11:33 AM   ASK your doctor about these medications         amantadine 100 MG capsule   Commonly known as: SYMMETREL   Take 100 mg by mouth 2 (two) times daily.      citalopram 40 MG tablet   Commonly known as: CELEXA   Take 40 mg by mouth daily.      GILENYA 0.5 MG Caps   Generic drug: Fingolimod HCl   Take 0.5 capsules by mouth daily.      hydrOXYzine 50 MG capsule   Commonly known as: VISTARIL   Take 50 mg by mouth at bedtime.      risperiDONE 2 MG tablet   Commonly known as: RISPERDAL   Take 2 mg by mouth at bedtime.      WELLBUTRIN 100 MG tablet   Generic drug: buPROPion   Take 150 mg by mouth daily. Pt takes 1.5 tab          Because Amantadine and Gilenya help your MS, Celexa and Wellbutrin help your depression.  Risperdal and Vistaril help with anxiety.  Kaylianna Detert 05/11/2011, 11:32 AM

## 2011-05-11 NOTE — Progress Notes (Signed)
[facility] Group Notes: (Counselor/Nursing/MHT/Case Management/Adjunct) 05/11/2011   @  11:00am   Type of Therapy:  Group Therapy  Participation Level:  Active  Participation Quality:  Attentive, Supportive  Affect:  Blunted  Cognitive:  Appropriate  Insight:  Limited  Engagement in Group: Good  Engagement in Therapy:  Limited  Modes of Intervention:  Support and Exploration  Summary of Progress/Problems: Ronak did not process his personal situations, but was very supportive of others who discussed the changes they need to make in their thought patterns in order to protect against relapse.   [name] 05/11/2011 12:35 PM

## 2011-05-11 NOTE — Progress Notes (Signed)
[facility] Group Notes:  (Counselor/Nursing/MHT/Case Management/Adjunct)  05/11/2011 1:15pm  Type of Therapy:  Psychoeducational Skills - [facility] [place]  Participation Level:  Active  Participation Quality:  Appropriate and Attentive  Affect:  Blunted  Cognitive:  Appropriate  Insight:  Good  Engagement in Group:  Good  Engagement in Therapy:  None  Modes of Intervention:  Education and Support  Summary of Progress/Problems: [name] was very engaged in discussion with speaker from [facility]. He expressed appreciation for the the organization and interest in the support groups it runs.   [name] 05/11/2011 2:38 PM

## 2011-05-11 NOTE — Progress Notes (Signed)
Recreation Therapy Notes  05/11/2011         Time: 1415      Group Topic/Focus: The focus of this group is on discussing various styles of communication and communicating assertively using 'I' (feeling) statements.  Participation Level: Active  Participation Quality: Attentive  Affect: Appropriate  Cognitive: Oriented   Additional Comments: Patient brighter today, says he is discharging and will be "fine" when he leaves   Dniyah Grant 05/11/2011 4:27 PM

## 2011-05-11 NOTE — Discharge Summary (Signed)
Discharge Note  Patient:  [name] is an 56 y.o., male DOB:  Jul 26, 1955  Date of Admission:  05/05/2011  Date of Discharge:  05/11/2011  Level of Care:  OP  Discharge destination:  HOME  Is patient on multiple antipsychotic therapies at discharge:  NO  Patient phone:  [phone] (home) Patient address:   [address] [place] [address]  The patient received suicide prevention pamphlet:  YES Belongings returned:  Valuables  Dan Humphreys, Jamieka Royle 05/11/2011,3:51 PM

## 2011-05-11 NOTE — Progress Notes (Signed)
Patient resting quietly with eyes closed. Respirations even and unlabored. No distress noted.

## 2011-05-14 NOTE — Progress Notes (Signed)
Patient Discharge Instructions:  Admission Note Faxed,  05/14/2011 Discharge Note Faxed,   05/14/2011 After Visit Summary Faxed,  05/14/2011 Faxed to the Next [facility] Care provider:  05/14/2011 D/C Summary faxed 05/14/2011 Facesheet faxed 05/14/2011   Faxed to [facility] Care - [place] @ [phone]  Wandra Scot, 05/14/2011, 5:34 PM

## 2011-05-19 ENCOUNTER — Encounter (HOSPITAL_COMMUNITY): Payer: Self-pay | Admitting: *Deleted

## 2011-05-19 ENCOUNTER — Inpatient Hospital Stay (HOSPITAL_COMMUNITY)
Admission: EM | Admit: 2011-05-19 | Discharge: 2011-05-22 | DRG: 917 | Disposition: A | Discharge status: Other Acute Inpatient Hospital | Payer: Medicare Other | Attending: Internal Medicine | Admitting: Internal Medicine

## 2011-05-19 ENCOUNTER — Other Ambulatory Visit: Payer: Self-pay

## 2011-05-19 DIAGNOSIS — T450X4A Poisoning by antiallergic and antiemetic drugs, undetermined, initial encounter: Principal | ICD-10-CM | POA: Diagnosis present

## 2011-05-19 DIAGNOSIS — J9600 Acute respiratory failure, unspecified whether with hypoxia or hypercapnia: Secondary | ICD-10-CM | POA: Diagnosis present

## 2011-05-19 DIAGNOSIS — F17200 Nicotine dependence, unspecified, uncomplicated: Secondary | ICD-10-CM | POA: Diagnosis present

## 2011-05-19 DIAGNOSIS — R4182 Altered mental status, unspecified: Secondary | ICD-10-CM

## 2011-05-19 DIAGNOSIS — T50991A Poisoning by other drugs, medicaments and biological substances, accidental (unintentional), initial encounter: Secondary | ICD-10-CM | POA: Diagnosis present

## 2011-05-19 DIAGNOSIS — G709 Myoneural disorder, unspecified: Secondary | ICD-10-CM | POA: Diagnosis present

## 2011-05-19 DIAGNOSIS — F19929 Other psychoactive substance use, unspecified with intoxication, unspecified: Secondary | ICD-10-CM | POA: Diagnosis present

## 2011-05-19 DIAGNOSIS — F32A Depression, unspecified: Secondary | ICD-10-CM

## 2011-05-19 DIAGNOSIS — T50901A Poisoning by unspecified drugs, medicaments and biological substances, accidental (unintentional), initial encounter: Secondary | ICD-10-CM

## 2011-05-19 DIAGNOSIS — F3289 Other specified depressive episodes: Secondary | ICD-10-CM | POA: Diagnosis present

## 2011-05-19 DIAGNOSIS — F1010 Alcohol abuse, uncomplicated: Secondary | ICD-10-CM | POA: Diagnosis present

## 2011-05-19 DIAGNOSIS — E162 Hypoglycemia, unspecified: Secondary | ICD-10-CM | POA: Diagnosis present

## 2011-05-19 DIAGNOSIS — F341 Dysthymic disorder: Secondary | ICD-10-CM

## 2011-05-19 DIAGNOSIS — F329 Major depressive disorder, single episode, unspecified: Secondary | ICD-10-CM | POA: Diagnosis present

## 2011-05-19 DIAGNOSIS — F10929 Alcohol use, unspecified with intoxication, unspecified: Secondary | ICD-10-CM

## 2011-05-19 DIAGNOSIS — G35 Multiple sclerosis: Secondary | ICD-10-CM | POA: Diagnosis present

## 2011-05-19 LAB — DIFFERENTIAL
Basophils Absolute: 0 10*3/uL (ref 0.0–0.1)
Basophils Relative: 0 % (ref 0–1)
Eosinophils Absolute: 0.1 10*3/uL (ref 0.0–0.7)
Eosinophils Relative: 2 % (ref 0–5)
Lymphocytes Relative: 37 % (ref 12–46)
Lymphs Abs: 2.2 10*3/uL (ref 0.7–4.0)
Monocytes Absolute: 0.4 10*3/uL (ref 0.1–1.0)
Monocytes Relative: 7 % (ref 3–12)
Neutro Abs: 3.2 10*3/uL (ref 1.7–7.7)
Neutrophils Relative %: 54 % (ref 43–77)

## 2011-05-19 LAB — ACETAMINOPHEN LEVEL: Acetaminophen (Tylenol), Serum: <15 ug/mL (ref 10–30)

## 2011-05-19 LAB — COMPREHENSIVE METABOLIC PANEL
ALT: 12 U/L (ref 0–53)
AST: 13 U/L (ref 0–37)
Albumin: 3.9 g/dL (ref 3.5–5.2)
Alkaline Phosphatase: 69 U/L (ref 39–117)
BUN: 10 mg/dL (ref 6–23)
CO2: 25 mEq/L (ref 19–32)
Calcium: 8.8 mg/dL (ref 8.4–10.5)
Chloride: 101 mEq/L (ref 96–112)
Creatinine, Ser: 1.18 mg/dL (ref 0.50–1.35)
GFR calc Af Amer: 79 mL/min — ABNORMAL LOW (ref >90)
GFR calc non Af Amer: 68 mL/min — ABNORMAL LOW (ref >90)
Glucose, Bld: 54 mg/dL — ABNORMAL LOW (ref 70–99)
Potassium: 3.7 mEq/L (ref 3.5–5.1)
Sodium: 136 mEq/L (ref 135–145)
Total Bilirubin: 0.6 mg/dL (ref 0.3–1.2)
Total Protein: 7.1 g/dL (ref 6.0–8.3)

## 2011-05-19 LAB — CBC
HCT: 42.4 % (ref 39.0–52.0)
Hemoglobin: 14.4 g/dL (ref 13.0–17.0)
MCH: 29.8 pg (ref 26.0–34.0)
MCHC: 34 g/dL (ref 30.0–36.0)
MCV: 87.6 fL (ref 78.0–100.0)
Platelets: 218 10*3/uL (ref 150–400)
RBC: 4.84 MIL/uL (ref 4.22–5.81)
RDW: 14.4 % (ref 11.5–15.5)
WBC: 6 10*3/uL (ref 4.0–10.5)

## 2011-05-19 LAB — APTT: aPTT: 30 seconds (ref 24–37)

## 2011-05-19 LAB — PROTIME-INR
INR: 0.91 (ref 0.00–1.49)
Prothrombin Time: 12.5 seconds (ref 11.6–15.2)

## 2011-05-19 LAB — GLUCOSE, CAPILLARY: Glucose-Capillary: 61 mg/dL — ABNORMAL LOW (ref 70–99)

## 2011-05-19 LAB — SALICYLATE LEVEL: Salicylate Lvl: <2 mg/dL — ABNORMAL LOW (ref 2.8–20.0)

## 2011-05-19 LAB — ETHANOL: Alcohol, Ethyl (B): 154 mg/dL — ABNORMAL HIGH (ref 0–11)

## 2011-05-19 MED ORDER — ONDANSETRON HCL 4 MG/2ML IJ SOLN: 4.0000 mg | Freq: Once | INTRAMUSCULAR | Status: DC

## 2011-05-19 MED ORDER — DEXTROSE 50 % IV SOLN
25.0000 g | Freq: Once | INTRAVENOUS | Status: AC
  Administered 2011-05-19: 25 g via INTRAVENOUS

## 2011-05-19 MED ORDER — DEXTROSE 50 % IV SOLN
INTRAVENOUS | Status: AC
  Administered 2011-05-19: 25 g via INTRAVENOUS
  Filled 2011-05-19: qty 50

## 2011-05-19 MED ORDER — ONDANSETRON HCL 4 MG/2ML IJ SOLN
INTRAMUSCULAR | Status: AC
  Filled 2011-05-19: qty 2

## 2011-05-19 MED ORDER — ONDANSETRON HCL 4 MG/2ML IJ SOLN
4.0000 mg | Freq: Once | INTRAMUSCULAR | Status: AC
  Administered 2011-05-19: 4 mg via INTRAVENOUS

## 2011-05-19 NOTE — ED Notes (Signed)
Pt comes from home via EMS with reported OD on Unisom taking between 75-90.

## 2011-05-19 NOTE — ED Notes (Signed)
RUE:AV40<JW> Expected date:05/19/11<BR> Expected time: 8:58 PM<BR> Means of arrival:Ambulance<BR> Comments:<BR> EMS [facility] - OD on Unisom/75-90 pills/cao per norm, ambulatory toward the ambulance, hx of same, pills taken approx. 30 minutes ago

## 2011-05-19 NOTE — ED Provider Notes (Signed)
History     CSN: [id number]  Arrival date & time 05/19/11  2101   None     Chief Complaint  Patient presents with  . Ingestion    between 75-90 Unisom     HPI  History provided by EMS report. Patient is a 56 year old male with history of MS, depression, past suicide attempts and mental disorder who presents after possible concerns for her overdose on Unisom. Patient was reported to have taken between 75-90 pills. Patient is awake but does not provide accurate history. He not says head yes confirming he took medications. Patient also notes his head yes that he is feeling depressed some. Patient mumbles and speaks incoherently. Patient has several visits to the emergency room with admissions to [facility] for similar complaints. he was discharged earlier this month for suicidal ideation.     Past Medical History  Diagnosis Date  . Mental disorder   . Depression   . Neuromuscular disorder   . Multiple sclerosis, relapsing-remitting     Past Surgical History  Procedure Date  . Tonsillectomy     No family history on file.  History  Substance Use Topics  . Smoking status: Current Everyday Smoker -- 0.5 packs/day for 36 years    Types: Cigarettes  . Smokeless tobacco: Not on file  . Alcohol Use: No      Review of Systems  Unable to perform ROS  level V applies due to altered mental status  Allergies  Review of patient's allergies indicates no known allergies.  Home Medications   Current Outpatient Rx  Name Route Sig Dispense Refill  . AMANTADINE HCL 100 MG PO CAPS Oral Take 100 mg by mouth 2 (two) times daily.    . BUPROPION HCL ER (XL) 150 MG PO TB24 Oral Take 1 tablet (150 mg total) by mouth daily. For depression 30 tablet 0  . CITALOPRAM HYDROBROMIDE 40 MG PO TABS Oral Take 40 mg by mouth daily.    [name] FINGOLIMOD HCL 0.5 MG PO CAPS Oral Take 0.5 capsules by mouth daily.    [name] HYDROXYZINE PAMOATE 50 MG PO CAPS Oral Take 50 mg by mouth at bedtime.    [name] RISPERIDONE 2 MG  PO TABS Oral Take 2 mg by mouth at bedtime.      BP 117/75  Pulse 101  Resp 16  SpO2 97%  Physical Exam  Nursing note and vitals reviewed. Constitutional: He appears well-developed and well-nourished.  HENT:  Head: Normocephalic and atraumatic.  Mouth/Throat: Oropharynx is clear and moist.  Eyes: Conjunctivae and EOM are normal. Pupils are equal, round, and reactive to light.       Pupils 4 mm and equal bilaterally  Neck: Normal range of motion. Neck supple.  Cardiovascular: Normal rate, regular rhythm and normal heart sounds.   No murmur heard. Pulmonary/Chest: Effort normal and breath sounds normal. No respiratory distress. He has no wheezes. He has no rales.  Abdominal: Soft. Bowel sounds are normal. He exhibits no distension. There is no tenderness. There is no rebound.  Neurological: He is alert.       Patient mumbles and does not speak clearly. He only very occasionally answers in an understandable voice. No facial droop. Equal strength in extremities.  Skin: Skin is warm. No rash noted. No erythema.    ED Course  Procedures  Labs Reviewed  CBC  DIFFERENTIAL  COMPREHENSIVE METABOLIC PANEL  PROTIME-INR  APTT  ACETAMINOPHEN LEVEL  SALICYLATE LEVEL  ETHANOL  URINE RAPID DRUG SCREEN (  HOSP PERFORMED)  POCT CBG MONITORING   No results found.   No diagnosis found.    MDM  9:15PM patient seen and evaluated. Patient in no acute distress.  9:30PM Pt discussed with Attending Physician.  He agrees with work up so far and will see pt.  9:40PM CBG at 61 pt not taking PO OJ.  Will give D50.   Spoke with triad hospitalists. They will see patient and admit for continued observation and medical clearance      Date: 05/19/2011  Rate: 96  Rhythm: normal sinus rhythm  QRS Axis: normal  Intervals: QT prolonged  ST/T Wave abnormalities: nonspecific T wave changes  Conduction Disutrbances: Borderlineintraventricular conduction delay  Narrative Interpretation:   Old  EKG Reviewed: changes noted new prolonged QT from 06/07/2010      Angus Seller, PA 05/20/11 [id number]

## 2011-05-19 NOTE — ED Notes (Signed)
Contacted Poison Control:  Expect agitation and possibly seizures, treat with benzos.  Reccommended to observe for at least 4-6 hours and asymptomatic.  Expect Tachycardia, please order acetaminophen level.

## 2011-05-20 ENCOUNTER — Emergency Department (HOSPITAL_COMMUNITY): Payer: Medicare Other

## 2011-05-20 ENCOUNTER — Other Ambulatory Visit: Payer: Self-pay

## 2011-05-20 ENCOUNTER — Encounter (HOSPITAL_COMMUNITY): Payer: Self-pay | Admitting: Internal Medicine

## 2011-05-20 DIAGNOSIS — J9600 Acute respiratory failure, unspecified whether with hypoxia or hypercapnia: Secondary | ICD-10-CM | POA: Diagnosis present

## 2011-05-20 DIAGNOSIS — F1910 Other psychoactive substance abuse, uncomplicated: Secondary | ICD-10-CM

## 2011-05-20 DIAGNOSIS — F1010 Alcohol abuse, uncomplicated: Secondary | ICD-10-CM

## 2011-05-20 DIAGNOSIS — F329 Major depressive disorder, single episode, unspecified: Secondary | ICD-10-CM | POA: Diagnosis present

## 2011-05-20 DIAGNOSIS — F32A Depression, unspecified: Secondary | ICD-10-CM | POA: Diagnosis present

## 2011-05-20 DIAGNOSIS — F19929 Other psychoactive substance use, unspecified with intoxication, unspecified: Secondary | ICD-10-CM | POA: Diagnosis present

## 2011-05-20 LAB — ACETAMINOPHEN LEVEL: Acetaminophen (Tylenol), Serum: <15 ug/mL (ref 10–30)

## 2011-05-20 LAB — GLUCOSE, CAPILLARY
Glucose-Capillary: 63 mg/dL — ABNORMAL LOW (ref 70–99)
Glucose-Capillary: 93 mg/dL (ref 70–99)
Glucose-Capillary: 68 mg/dL — ABNORMAL LOW (ref 70–99)
Glucose-Capillary: 101 mg/dL — ABNORMAL HIGH (ref 70–99)
Glucose-Capillary: 80 mg/dL (ref 70–99)
Glucose-Capillary: 73 mg/dL (ref 70–99)
Glucose-Capillary: 92 mg/dL (ref 70–99)
Glucose-Capillary: 111 mg/dL — ABNORMAL HIGH (ref 70–99)

## 2011-05-20 LAB — MRSA PCR SCREENING: MRSA by PCR: NEGATIVE

## 2011-05-20 LAB — BLOOD GAS, ARTERIAL
Acid-base deficit: 4.1 mmol/L — ABNORMAL HIGH (ref 0.0–2.0)
Acid-base deficit: 4.1 mmol/L — ABNORMAL HIGH (ref 0.0–2.0)
Acid-base deficit: 2.4 mmol/L — ABNORMAL HIGH (ref 0.0–2.0)
Bicarbonate: 21.5 mEq/L (ref 20.0–24.0)
Bicarbonate: 20.1 mEq/L (ref 20.0–24.0)
Bicarbonate: 21.7 mEq/L (ref 20.0–24.0)
Drawn by: 313061
Drawn by: 313061
FIO2: 1 %
FIO2: 0.4 %
FIO2: 0.4 %
MECHVT: 560 mL
MECHVT: 560 mL
MECHVT: 600 mL
O2 Saturation: 99.4 %
O2 Saturation: 95.3 %
O2 Saturation: 98.5 %
PEEP: 5 cmH2O
PEEP: 5 cmH2O
PEEP: 5 cmH2O
Patient temperature: 98.6
Patient temperature: 98.6
Patient temperature: 98.6
RATE: 16 resp/min
RATE: 18 resp/min
RATE: 18 resp/min
TCO2: 19.8 mmol/L (ref 0–100)
TCO2: 18.3 mmol/L (ref 0–100)
TCO2: 19.4 mmol/L (ref 0–100)
pCO2 arterial: 44.2 mmHg (ref 35.0–45.0)
pCO2 arterial: 36 mmHg (ref 35.0–45.0)
pCO2 arterial: 37.1 mmHg (ref 35.0–45.0)
pH, Arterial: 7.309 — ABNORMAL LOW (ref 7.350–7.450)
pH, Arterial: 7.366 (ref 7.350–7.450)
pH, Arterial: 7.385 (ref 7.350–7.450)
pO2, Arterial: 397 mmHg — ABNORMAL HIGH (ref 80.0–100.0)
pO2, Arterial: 74.2 mmHg — ABNORMAL LOW (ref 80.0–100.0)
pO2, Arterial: 101 mmHg — ABNORMAL HIGH (ref 80.0–100.0)

## 2011-05-20 LAB — CBC
HCT: 37 % — ABNORMAL LOW (ref 39.0–52.0)
Hemoglobin: 12.2 g/dL — ABNORMAL LOW (ref 13.0–17.0)
MCH: 28.8 pg (ref 26.0–34.0)
MCHC: 33 g/dL (ref 30.0–36.0)
MCV: 87.5 fL (ref 78.0–100.0)
Platelets: 184 10*3/uL (ref 150–400)
RBC: 4.23 MIL/uL (ref 4.22–5.81)
RDW: 14.7 % (ref 11.5–15.5)
WBC: 7.1 10*3/uL (ref 4.0–10.5)

## 2011-05-20 LAB — DIFFERENTIAL
Basophils Absolute: 0 10*3/uL (ref 0.0–0.1)
Basophils Relative: 0 % (ref 0–1)
Eosinophils Absolute: 0.1 10*3/uL (ref 0.0–0.7)
Eosinophils Relative: 1 % (ref 0–5)
Lymphocytes Relative: 25 % (ref 12–46)
Lymphs Abs: 1.8 10*3/uL (ref 0.7–4.0)
Monocytes Absolute: 0.6 10*3/uL (ref 0.1–1.0)
Monocytes Relative: 8 % (ref 3–12)
Neutro Abs: 4.6 10*3/uL (ref 1.7–7.7)
Neutrophils Relative %: 66 % (ref 43–77)

## 2011-05-20 LAB — COMPREHENSIVE METABOLIC PANEL
ALT: 10 U/L (ref 0–53)
AST: 16 U/L (ref 0–37)
Albumin: 3.3 g/dL — ABNORMAL LOW (ref 3.5–5.2)
Alkaline Phosphatase: 56 U/L (ref 39–117)
BUN: 9 mg/dL (ref 6–23)
CO2: 23 mEq/L (ref 19–32)
Calcium: 8 mg/dL — ABNORMAL LOW (ref 8.4–10.5)
Chloride: 107 mEq/L (ref 96–112)
Creatinine, Ser: 1 mg/dL (ref 0.50–1.35)
GFR calc Af Amer: >90 mL/min (ref >90)
GFR calc non Af Amer: 83 mL/min — ABNORMAL LOW (ref >90)
Glucose, Bld: 147 mg/dL — ABNORMAL HIGH (ref 70–99)
Potassium: 3.8 mEq/L (ref 3.5–5.1)
Sodium: 138 mEq/L (ref 135–145)
Total Bilirubin: 0.3 mg/dL (ref 0.3–1.2)
Total Protein: 5.9 g/dL — ABNORMAL LOW (ref 6.0–8.3)

## 2011-05-20 LAB — RAPID URINE DRUG SCREEN, HOSP PERFORMED
Amphetamines: NOT DETECTED
Barbiturates: NOT DETECTED
Benzodiazepines: NOT DETECTED
Cocaine: NOT DETECTED
Opiates: NOT DETECTED
Tetrahydrocannabinol: NOT DETECTED

## 2011-05-20 LAB — MAGNESIUM: Magnesium: 2.5 mg/dL (ref 1.5–2.5)

## 2011-05-20 LAB — CK: Total CK: 486 U/L — ABNORMAL HIGH (ref 7–232)

## 2011-05-20 LAB — PHOSPHORUS: Phosphorus: 3.9 mg/dL (ref 2.3–4.6)

## 2011-05-20 MED ORDER — PROPOFOL 10 MG/ML IV EMUL
5.0000 ug/kg/min | Freq: Once | INTRAVENOUS | Status: AC
  Administered 2011-05-20: 55 ug/kg/min via INTRAVENOUS
  Administered 2011-05-20: 21.994 ug/kg/min via INTRAVENOUS

## 2011-05-20 MED ORDER — LORAZEPAM 2 MG/ML IJ SOLN
1.0000 mg | Freq: Once | INTRAMUSCULAR | Status: AC
  Administered 2011-05-20: 1 mg via INTRAVENOUS
  Filled 2011-05-20: qty 1

## 2011-05-20 MED ORDER — BIOTENE DRY MOUTH MT LIQD
15.0000 mL | Freq: Four times a day (QID) | OROMUCOSAL | Status: DC
  Administered 2011-05-20 – 2011-05-21 (×3): 15 mL via OROMUCOSAL

## 2011-05-20 MED ORDER — MIDAZOLAM HCL 2 MG/2ML IJ SOLN
2.0000 mg | Freq: Once | INTRAMUSCULAR | Status: AC
  Administered 2011-05-20: 2 mg via INTRAVENOUS

## 2011-05-20 MED ORDER — DEXTROSE 50 % IV SOLN
INTRAVENOUS | Status: AC
  Administered 2011-05-20: 25 mL
  Filled 2011-05-20: qty 50

## 2011-05-20 MED ORDER — PANTOPRAZOLE SODIUM 40 MG IV SOLR
40.0000 mg | Freq: Every day | INTRAVENOUS | Status: DC
  Administered 2011-05-20 – 2011-05-21 (×2): 40 mg via INTRAVENOUS
  Filled 2011-05-20 (×3): qty 40

## 2011-05-20 MED ORDER — SODIUM CHLORIDE 0.9 % IV SOLN
250.0000 mL | INTRAVENOUS | Status: DC | PRN
  Administered 2011-05-20: 500 mL via INTRAVENOUS

## 2011-05-20 MED ORDER — FENTANYL BOLUS VIA INFUSION
50.0000 ug | Freq: Four times a day (QID) | INTRAVENOUS | Status: DC | PRN
  Filled 2011-05-20: qty 100

## 2011-05-20 MED ORDER — PROPOFOL 10 MG/ML IV EMUL
INTRAVENOUS | Status: AC
  Filled 2011-05-20: qty 50

## 2011-05-20 MED ORDER — ROCURONIUM BROMIDE 50 MG/5ML IV SOLN
INTRAVENOUS | Status: AC
  Filled 2011-05-20: qty 2

## 2011-05-20 MED ORDER — MAGNESIUM SULFATE 40 MG/ML IJ SOLN
2.0000 g | Freq: Once | INTRAMUSCULAR | Status: AC
  Administered 2011-05-20: 2 g via INTRAVENOUS
  Filled 2011-05-20: qty 50

## 2011-05-20 MED ORDER — LORAZEPAM 2 MG/ML IJ SOLN: 2.0000 mg | Freq: Once | INTRAMUSCULAR | Status: AC

## 2011-05-20 MED ORDER — LORAZEPAM 2 MG/ML IJ SOLN
2.0000 mg | Freq: Once | INTRAMUSCULAR | Status: AC
  Administered 2011-05-20: 2 mg via INTRAVENOUS
  Filled 2011-05-20: qty 1

## 2011-05-20 MED ORDER — CHLORHEXIDINE GLUCONATE 0.12 % MT SOLN
15.0000 mL | Freq: Two times a day (BID) | OROMUCOSAL | Status: DC
  Administered 2011-05-20 – 2011-05-21 (×3): 15 mL via OROMUCOSAL
  Filled 2011-05-20 (×4): qty 15

## 2011-05-20 MED ORDER — SUCCINYLCHOLINE CHLORIDE 20 MG/ML IJ SOLN
INTRAMUSCULAR | Status: AC
  Administered 2011-05-20: 150 mg via INTRAVENOUS
  Filled 2011-05-20: qty 5

## 2011-05-20 MED ORDER — ONDANSETRON HCL 4 MG/2ML IJ SOLN
4.0000 mg | Freq: Once | INTRAMUSCULAR | Status: AC
  Administered 2011-05-20: 4 mg via INTRAVENOUS
  Filled 2011-05-20: qty 2

## 2011-05-20 MED ORDER — FENTANYL CITRATE 0.05 MG/ML IJ SOLN
100.0000 ug | Freq: Once | INTRAMUSCULAR | Status: AC
  Administered 2011-05-20: 100 ug via INTRAVENOUS

## 2011-05-20 MED ORDER — DEXTROSE 50 % IV SOLN
INTRAVENOUS | Status: AC
  Administered 2011-05-20: 50 mL via INTRAVENOUS
  Filled 2011-05-20: qty 50

## 2011-05-20 MED ORDER — FENTANYL CITRATE 0.05 MG/ML IJ SOLN
INTRAMUSCULAR | Status: AC
  Administered 2011-05-20: 100 ug via INTRAVENOUS
  Filled 2011-05-20: qty 2

## 2011-05-20 MED ORDER — DEXTROSE 50 % IV SOLN
50.0000 mL | Freq: Once | INTRAVENOUS | Status: AC
  Administered 2011-05-20: 50 mL via INTRAVENOUS

## 2011-05-20 MED ORDER — SUCCINYLCHOLINE CHLORIDE 20 MG/ML IJ SOLN
150.0000 mg | Freq: Once | INTRAMUSCULAR | Status: AC
  Administered 2011-05-20: 150 mg via INTRAVENOUS

## 2011-05-20 MED ORDER — ETOMIDATE 2 MG/ML IV SOLN
INTRAVENOUS | Status: AC
  Administered 2011-05-20: 20 mg via INTRAVENOUS
  Filled 2011-05-20: qty 20

## 2011-05-20 MED ORDER — ALBUTEROL SULFATE HFA 108 (90 BASE) MCG/ACT IN AERS: 4.0000 | INHALATION_SPRAY | RESPIRATORY_TRACT | Status: DC | PRN

## 2011-05-20 MED ORDER — PROPOFOL 10 MG/ML IV EMUL
INTRAVENOUS | Status: AC
  Administered 2011-05-20: 55 ug/kg/min via INTRAVENOUS
  Filled 2011-05-20: qty 100

## 2011-05-20 MED ORDER — PROPOFOL 10 MG/ML IV EMUL
INTRAVENOUS | Status: AC
  Administered 2011-05-20: 21.994 ug/kg/min via INTRAVENOUS
  Filled 2011-05-20: qty 100

## 2011-05-20 MED ORDER — SODIUM CHLORIDE 0.9 % IV SOLN
50.0000 ug/h | INTRAVENOUS | Status: DC
  Administered 2011-05-20: 50 ug/h via INTRAVENOUS
  Administered 2011-05-21: 100 ug/h via INTRAVENOUS
  Filled 2011-05-20: qty 50

## 2011-05-20 MED ORDER — SODIUM CHLORIDE 0.9 % IV BOLUS (SEPSIS)
1000.0000 mL | Freq: Once | INTRAVENOUS | Status: AC
  Administered 2011-05-20: 1000 mL via INTRAVENOUS

## 2011-05-20 MED ORDER — PROPOFOL 10 MG/ML IV EMUL
INTRAVENOUS | Status: AC
  Administered 2011-05-20: 20 ug/kg/min via INTRAVENOUS
  Filled 2011-05-20: qty 50

## 2011-05-20 MED ORDER — ETOMIDATE 2 MG/ML IV SOLN
20.0000 mg | Freq: Once | INTRAVENOUS | Status: AC
  Administered 2011-05-20: 20 mg via INTRAVENOUS

## 2011-05-20 MED ORDER — MIDAZOLAM HCL 2 MG/2ML IJ SOLN
INTRAMUSCULAR | Status: AC
  Administered 2011-05-20: 2 mg via INTRAVENOUS
  Filled 2011-05-20: qty 2

## 2011-05-20 MED ORDER — MAGNESIUM SULFATE 50 % IJ SOLN: 2.0000 g | Freq: Once | INTRAMUSCULAR | Status: DC

## 2011-05-20 MED ORDER — THIAMINE HCL 100 MG/ML IJ SOLN
Freq: Once | INTRAVENOUS | Status: AC
  Administered 2011-05-20: 09:00:00 via INTRAVENOUS
  Filled 2011-05-20: qty 1000

## 2011-05-20 MED ORDER — LIDOCAINE HCL (CARDIAC) 20 MG/ML IV SOLN
INTRAVENOUS | Status: AC
  Filled 2011-05-20: qty 5

## 2011-05-20 MED ORDER — PROPOFOL 10 MG/ML IV EMUL
5.0000 ug/kg/min | INTRAVENOUS | Status: DC
  Administered 2011-05-20: 50 ug/kg/min via INTRAVENOUS
  Administered 2011-05-20: 53.471 ug/kg/min via INTRAVENOUS
  Administered 2011-05-20 (×2): 20 ug/kg/min via INTRAVENOUS
  Administered 2011-05-21 (×2): 25 ug/kg/min via INTRAVENOUS
  Administered 2011-05-21: 20 ug/kg/min via INTRAVENOUS
  Filled 2011-05-20: qty 100

## 2011-05-20 MED ORDER — PROPOFOL 10 MG/ML IV EMUL
INTRAVENOUS | Status: AC
  Administered 2011-05-21: 20 ug/kg/min via INTRAVENOUS
  Filled 2011-05-20: qty 50

## 2011-05-20 MED ORDER — DEXTROSE-NACL 5-0.9 % IV SOLN
INTRAVENOUS | Status: DC
  Administered 2011-05-20 – 2011-05-21 (×5): via INTRAVENOUS

## 2011-05-20 NOTE — ED Notes (Signed)
Propofol titrated to keep sedation and systolic above 100, currently rate is 29.2 ml/hr

## 2011-05-20 NOTE — ED Notes (Signed)
CBG was 94

## 2011-05-20 NOTE — Progress Notes (Signed)
05/20/11 - Mr. [name] has only one contact in his chart; a Ms. Jinny Sanders.  Called number listed and she called me back.  Ms. Earlene Plater runs half-way house/group home.  Patient is divorced and has an 56 year old son. I looked through his cell phone and there were no contacts.  Ms. Earlene Plater stated that [name] was in [facility] for about a week a couple of weeks ago.  She says he has suicidal ideation but has never acted upon it and she thought he was doing much better since his stay at [facility].  He has to sign up for "recreational time" away from the home and lately Ms. Earlene Plater has been denying his recreational time due to his suicidal ideation.  She granted him his recreational time last night and he left the group home and checked into a hotel.  He told the caregivers in the home that he was going to have a couple of beers and just relax.  (They are not able to consume alcohol at the home.)  He actually told Ms. Earlene Plater that he was not going to do "anything stupid".  He normally checks in with them during the evening and when he did not they became very concerned and went to the hotel to look for him.  He was not in his room and they found empty boxes of Unisom.  They contacted the police, but did not know that he had been brought to the hospital.  She told me that the [employer] was getting ready to place a civil alert for [name].  She will contact them and let them know that she now knows where he is.  An emergency call was placed from [name]'s phone last night.  I don't know if he called 911 or someone he was with did.  He has not shared any more personal information with Ms. Earlene Plater; she knows of no other family.

## 2011-05-20 NOTE — H&P (Signed)
[name] is an 56 y.o. male.   Chief Complaint: Antihistamine intoxication, acute respiratory failure HPI: Pt is a 56 y/o M with PMHx of depression with prior suicidal attempts, who came to [facility] and reported that he took between 75 to 90 tabs of Unisom (Doxalamine). Pt was initially mildly confused but responsive and then he became more lethargic, had nausea/vomiting and less responsive and was intubated for airway protection. Blood sugars were low 54's and receive D50.  CXR after intubation showed mild atelectasis at the bases. Further workup showed negative acetaminophen level, salycilates and high alcohol level.   Past Medical History   Diagnosis  Date   .  Mental disorder    .  Depression    .  Neuromuscular disorder    .  Multiple sclerosis, relapsing-remitting       Past Medical History  Diagnosis Date  . Mental disorder   . Depression   . Neuromuscular disorder   . Multiple sclerosis, relapsing-remitting     Past Surgical History  Procedure Date  . Tonsillectomy     No family history on file. Social History:  reports that he has been smoking Cigarettes.  He has a 18 pack-year smoking history. He does not have any smokeless tobacco history on file. He reports that he does not drink alcohol or use illicit drugs.  Allergies: No Known Allergies  Medications Prior to Admission  Medication Dose Route Frequency Provider Last Rate Last Dose  . 0.9 %  sodium chloride infusion  250 mL Intravenous PRN Orbie Hurst, MD      . albuterol (PROVENTIL HFA;VENTOLIN HFA) 108 (90 BASE) MCG/ACT inhaler 4 puff  4 puff Inhalation Q2H PRN Orbie Hurst, MD      . dextrose 5 %-0.9 % sodium chloride infusion   Intravenous Continuous Orbie Hurst, MD 100 mL/hr at 05/20/11 0622    . dextrose 50 % solution 25 g  25 g Intravenous Once Angus Seller, PA   25 g at 05/19/11 2147  . dextrose 50 % solution 50 mL  50 mL Intravenous Once Orbie Hurst, MD   50 mL at 05/20/11 0622  . etomidate (AMIDATE) injection  20 mg  20 mg Intravenous Once Joya Gaskins, MD   20 mg at 05/20/11 0115  . fentaNYL (SUBLIMAZE) 10 mcg/mL in sodium chloride 0.9 % 250 mL infusion  50-400 mcg/hr Intravenous Titrated Orbie Hurst, MD       And  . fentaNYL (SUBLIMAZE) bolus via infusion 50-100 mcg  50-100 mcg Intravenous Q6H PRN Orbie Hurst, MD      . fentaNYL (SUBLIMAZE) injection 100 mcg  100 mcg Intravenous Once Joya Gaskins, MD   100 mcg at 05/20/11 0138  . lidocaine (cardiac) 100 mg/70ml (XYLOCAINE) 20 MG/ML injection 2%           . LORazepam (ATIVAN) injection 1 mg  1 mg Intravenous Once Angus Seller, PA   1 mg at 05/20/11 0028  . LORazepam (ATIVAN) injection 1 mg  1 mg Intravenous Once Angus Seller, PA   1 mg at 05/20/11 0108  . LORazepam (ATIVAN) injection 2 mg  2 mg Intravenous Once Joya Gaskins, MD      . LORazepam (ATIVAN) injection 2 mg  2 mg Intravenous Once Joya Gaskins, MD   2 mg at 05/20/11 0547  . magnesium sulfate IVPB 2 g 50 mL  2 g Intravenous Once Joya Gaskins, MD   2 g at 05/20/11 1610  .  midazolam (VERSED) injection 2 mg  2 mg Intravenous Once Orbie Hurst, MD   2 mg at 05/20/11 1478  . ondansetron (ZOFRAN) injection 4 mg  4 mg Intravenous Once Loren Racer, MD   4 mg at 05/19/11 2158  . ondansetron (ZOFRAN) injection 4 mg  4 mg Intravenous Once Angus Seller, PA   4 mg at 05/20/11 0108  . pantoprazole (PROTONIX) injection 40 mg  40 mg Intravenous QHS Orbie Hurst, MD      . propofol (DIPRIVAN) 10 MG/ML infusion  5-70 mcg/kg/min Intravenous Once Joya Gaskins, MD 29.7 mL/hr at 05/20/11 0623 60 mcg/kg/min at 05/20/11 0623  . propofol (DIPRIVAN) 10 MG/ML infusion  5-50 mcg/kg/min Intravenous Titrated Orbie Hurst, MD      . rocuronium [facility]) 50 MG/5ML injection           . sodium chloride 0.9 % bolus 1,000 mL  1,000 mL Intravenous Once Joya Gaskins, MD   1,000 mL at 05/20/11 0248  . succinylcholine (ANECTINE) injection 150 mg  150 mg Intravenous Once Joya Gaskins, MD    150 mg at 05/20/11 0117  . DISCONTD: magnesium sulfate (IV Push/IM) injection 2 g  2 g Intravenous Once Orbie Hurst, MD      . DISCONTD: ondansetron [facility]) injection 4 mg  4 mg Intravenous Once Loren Racer, MD       Medications Prior to Admission  Medication Sig Dispense Refill  . doxylamine, Sleep, (UNISOM) 25 MG tablet Take 25 mg by mouth at bedtime as needed.      [name] amantadine (SYMMETREL) 100 MG capsule Take 100 mg by mouth 2 (two) times daily.      [name] buPROPion (WELLBUTRIN XL) 150 MG 24 hr tablet Take 1 tablet (150 mg total) by mouth daily. For depression  30 tablet  0  . citalopram (CELEXA) 40 MG tablet Take 40 mg by mouth daily.      . Fingolimod HCl (GILENYA) 0.5 MG CAPS Take 0.5 capsules by mouth daily.      . hydrOXYzine (VISTARIL) 50 MG capsule Take 50 mg by mouth at bedtime.      . risperiDONE (RISPERDAL) 2 MG tablet Take 2 mg by mouth at bedtime.        Results for orders placed during the hospital encounter of 05/19/11 (from the past 48 hour(s))  GLUCOSE, CAPILLARY     Status: Abnormal   Collection Time   05/19/11  9:22 PM      Component Value Range Comment   Glucose-Capillary 61 (*) 70 - 99 (mg/dL)   CBC     Status: Normal   Collection Time   05/19/11  9:30 PM      Component Value Range Comment   WBC 6.0  4.0 - 10.5 (K/uL)    RBC 4.84  4.22 - 5.81 (MIL/uL)    Hemoglobin 14.4  13.0 - 17.0 (g/dL)    HCT 29.5  62.1 - 30.8 (%)    MCV 87.6  78.0 - 100.0 (fL)    MCH 29.8  26.0 - 34.0 (pg)    MCHC 34.0  30.0 - 36.0 (g/dL)    RDW 65.7  84.6 - 96.2 (%)    Platelets 218  150 - 400 (K/uL)   DIFFERENTIAL     Status: Normal   Collection Time   05/19/11  9:30 PM      Component Value Range Comment   Neutrophils Relative 54  43 - 77 (%)    Neutro Abs  3.2  1.7 - 7.7 (K/uL)    Lymphocytes Relative 37  12 - 46 (%)    Lymphs Abs 2.2  0.7 - 4.0 (K/uL)    Monocytes Relative 7  3 - 12 (%)    Monocytes Absolute 0.4  0.1 - 1.0 (K/uL)    Eosinophils Relative 2  0 - 5 (%)     Eosinophils Absolute 0.1  0.0 - 0.7 (K/uL)    Basophils Relative 0  0 - 1 (%)    Basophils Absolute 0.0  0.0 - 0.1 (K/uL)   COMPREHENSIVE METABOLIC PANEL     Status: Abnormal   Collection Time   05/19/11  9:30 PM      Component Value Range Comment   Sodium 136  135 - 145 (mEq/L)    Potassium 3.7  3.5 - 5.1 (mEq/L)    Chloride 101  96 - 112 (mEq/L)    CO2 25  19 - 32 (mEq/L)    Glucose, Bld 54 (*) 70 - 99 (mg/dL)    BUN 10  6 - 23 (mg/dL)    Creatinine, Ser 8.29  0.50 - 1.35 (mg/dL)    Calcium 8.8  8.4 - 10.5 (mg/dL)    Total Protein 7.1  6.0 - 8.3 (g/dL)    Albumin 3.9  3.5 - 5.2 (g/dL)    AST 13  0 - 37 (U/L)    ALT 12  0 - 53 (U/L)    Alkaline Phosphatase 69  39 - 117 (U/L)    Total Bilirubin 0.6  0.3 - 1.2 (mg/dL)    GFR calc non Af Amer 68 (*) >90 (mL/min)    GFR calc Af Amer 79 (*) >90 (mL/min)   PROTIME-INR     Status: Normal   Collection Time   05/19/11  9:30 PM      Component Value Range Comment   Prothrombin Time 12.5  11.6 - 15.2 (seconds)    INR 0.91  0.00 - 1.49    APTT     Status: Normal   Collection Time   05/19/11  9:30 PM      Component Value Range Comment   aPTT 30  24 - 37 (seconds)   ACETAMINOPHEN LEVEL     Status: Normal   Collection Time   05/19/11  9:30 PM      Component Value Range Comment   Acetaminophen (Tylenol), Serum <15.0  10 - 30 (ug/mL)   SALICYLATE LEVEL     Status: Abnormal   Collection Time   05/19/11  9:30 PM      Component Value Range Comment   Salicylate Lvl <2.0 (*) 2.8 - 20.0 (mg/dL)   ETHANOL     Status: Abnormal   Collection Time   05/19/11  9:30 PM      Component Value Range Comment   Alcohol, Ethyl (B) 154 (*) 0 - 11 (mg/dL)   URINE RAPID DRUG SCREEN (HOSP PERFORMED)     Status: Normal   Collection Time   05/20/11 12:20 AM      Component Value Range Comment   Opiates NONE DETECTED  NONE DETECTED     Cocaine NONE DETECTED  NONE DETECTED     Benzodiazepines NONE DETECTED  NONE DETECTED     Amphetamines NONE DETECTED  NONE  DETECTED     Tetrahydrocannabinol NONE DETECTED  NONE DETECTED     Barbiturates NONE DETECTED  NONE DETECTED    BLOOD GAS, ARTERIAL     Status: Abnormal  Collection Time   05/20/11  1:57 AM      Component Value Range Comment   FIO2 1.00      Delivery systems VENTILATOR      Mode PRESSURE REGULATED VOLUME CONTROL      VT 560      Rate 16      Peep/cpap 5.0      pH, Arterial 7.309 (*) 7.350 - 7.450     pCO2 arterial 44.2  35.0 - 45.0 (mmHg)    pO2, Arterial 397.0 (*) 80.0 - 100.0 (mmHg)    Bicarbonate 21.5  20.0 - 24.0 (mEq/L)    TCO2 19.8  0 - 100 (mmol/L)    Acid-base deficit 4.1 (*) 0.0 - 2.0 (mmol/L)    O2 Saturation 99.4      Patient temperature 98.6      Collection site RIGHT RADIAL      Drawn by [id number]      Sample type ARTERIAL DRAW      Allens test (pass/fail) PASS  PASS    ACETAMINOPHEN LEVEL     Status: Normal   Collection Time   05/20/11  2:15 AM      Component Value Range Comment   Acetaminophen (Tylenol), Serum <15.0  10 - 30 (ug/mL)   BLOOD GAS, ARTERIAL     Status: Abnormal   Collection Time   05/20/11  3:04 AM      Component Value Range Comment   FIO2 0.40      Delivery systems VENTILATOR      Mode PRESSURE REGULATED VOLUME CONTROL      VT 560      Rate 18      Peep/cpap 5.0      pH, Arterial 7.366  7.350 - 7.450     pCO2 arterial 36.0  35.0 - 45.0 (mmHg)    pO2, Arterial 74.2 (*) 80.0 - 100.0 (mmHg)    Bicarbonate 20.1  20.0 - 24.0 (mEq/L)    TCO2 18.3  0 - 100 (mmol/L)    Acid-base deficit 4.1 (*) 0.0 - 2.0 (mmol/L)    O2 Saturation 95.3      Patient temperature 98.6      Collection site RIGHT RADIAL      Drawn by [id number]      Sample type ARTERIAL DRAW      Allens test (pass/fail) PASS  PASS    GLUCOSE, CAPILLARY     Status: Abnormal   Collection Time   05/20/11  6:14 AM      Component Value Range Comment   Glucose-Capillary 63 (*) 70 - 99 (mg/dL)    Dg Chest Portable 1 View  05/20/2011  *RADIOLOGY REPORT*  Clinical Data: Acute respiratory  failure.  Intubation.  PORTABLE CHEST - 1 VIEW  Comparison: 12/20/2010  Findings: The endotracheal tube is seen in appropriate position, with tip in the mid thoracic trachea.  Nasogastric tube is seen entering the stomach.  Low lung volumes are seen with mild bibasilar atelectasis.  No evidence of pulmonary consolidation or pleural effusion.  Heart size is within normal limits.  IMPRESSION:  1.  Endotracheal tube and nasogastric tube in appropriate position. 2.  Low lung volumes and mild bibasilar atelectasis.  Original Report Authenticated By: Danae Orleans, M.D.    ROS All other systems were negative except as above in HPI  Blood pressure 125/88, pulse 88, temperature 98.5 F (36.9 C), temperature source Oral, resp. rate 18, height 5\' 11"  (1.803 m), weight 82.555 kg (182  lb), SpO2 100.00%. Physical Exam  Pt is intubated, critically ill HEENT: ET tube in place, no JVD. CV: regular rhythm, S1 and S2 normal Resp: clear breath sounds bilat with no wheezes, rhonchi or crackles. Abdomen: distended, bowel sounds +, no peritoneal signs. Extrem: no low extrem edema. No calf tenderness. Neurol: under sedation  Assessment/Plan  Pt is a 56 y/o M with PMHx of depression with prior suicidal attempts, who came to [facility] and reported that he took between 75 to 90 tabs of Unisom (Doxalamine) and became unresponsive, and was intubated for airway protection.  1. Acute Respiratory Failure - Due to drug intoxication (Doxalamine) and alcohol intoxication - Pt was intubated for airway protection - PRVC 18/560/50%/5 ABG 7.36/36/74/20 - CXR with bibasilar atelectasis - Possible risk of aspiration pneumonitis due to AMS and nausea/vomiting before intubation - WBC 6 - Follow ABG and CXR - Sedated with propofol and Fentanyl  2. Doxalamine Intoxication - Antihistamine toxicity - EKG with QTc >500 on admission, risk for Torsades - Pt was given Mag sulfate 2g IV - Will check CMP, Mg, Phosp - Follow EKG - No  charcoal on admission - Intubated for airway protection - Cont vent support  3. Alcohol Intoxication - Possible Hx of alcohol abuse - Intubated for airway protection - Sedated with propofol - Hypoglycemia on admission - On NS/D5 - Will watch for alcohol withdrawal - Banana bag (Thiamine, Folate, Multivit)  4. Hypoglycemia - Probably due to alcohol intoxication/drug overdose - Cont D5/NS - Accucheck Q4h  5. Depression - Hx of previous suicidal attempts. - Will get Psych consult once extubated - Hold antidepress meds for now.  DVT/GI prophylaxis SCDs and Protonix  NPO for now.  Total critical care time: 60 min.  Rajon Bisig 05/20/2011, 6:41 AM

## 2011-05-20 NOTE — Procedures (Signed)
Pt transferred to ICU on sero I,  FIO2= 100%. No complications.

## 2011-05-20 NOTE — ED Notes (Signed)
Per Dr. Bebe Shaggy pt not to be transported to ICU until CCM has seen pt.

## 2011-05-20 NOTE — ED Notes (Signed)
MD at bedside. 

## 2011-05-20 NOTE — ED Notes (Signed)
Pt noted to be declining in mentation and is more agitated.  Pt noted to have nystagmus and to be very tremulous. Dr notified of pt change in status, pt moved to resus A room for intubation.

## 2011-05-20 NOTE — ED Provider Notes (Signed)
History     CSN: [id number]  Arrival date & time 05/19/11  2101   First MD Initiated Contact with Patient 05/19/11 2132      Chief Complaint  Patient presents with  . Ingestion    between 75-90 Unisom    (Consider location/radiation/quality/duration/timing/severity/associated sxs/prior treatment) HPI  Past Medical History  Diagnosis Date  . Mental disorder   . Depression   . Neuromuscular disorder   . Multiple sclerosis, relapsing-remitting     Past Surgical History  Procedure Date  . Tonsillectomy     No family history on file.  History  Substance Use Topics  . Smoking status: Current Everyday Smoker -- 0.5 packs/day for 36 years    Types: Cigarettes  . Smokeless tobacco: Not on file  . Alcohol Use: No      Review of Systems  Allergies  Review of patient's allergies indicates no known allergies.  Home Medications   Current Outpatient Rx  Name Route Sig Dispense Refill  . AMANTADINE HCL 100 MG PO CAPS Oral Take 100 mg by mouth 2 (two) times daily.    . BUPROPION HCL ER (XL) 150 MG PO TB24 Oral Take 1 tablet (150 mg total) by mouth daily. For depression 30 tablet 0  . CITALOPRAM HYDROBROMIDE 40 MG PO TABS Oral Take 40 mg by mouth daily.    [name] FINGOLIMOD HCL 0.5 MG PO CAPS Oral Take 0.5 capsules by mouth daily.    [name] HYDROXYZINE PAMOATE 50 MG PO CAPS Oral Take 50 mg by mouth at bedtime.    [name] RISPERIDONE 2 MG PO TABS Oral Take 2 mg by mouth at bedtime.      BP 99/64  Pulse 89  Temp(Src) 98.5 F (36.9 C) (Oral)  Resp 19  SpO2 96%  Physical Exam  ED Course  INTUBATION Date/Time: 05/20/2011 1:33 AM Performed by: Joya Gaskins Authorized by: Joya Gaskins Consent: The procedure was performed in an emergent situation. Patient identity confirmed: provided demographic data and arm band Time out: Immediately prior to procedure a "time out" was called to verify the correct patient, procedure, equipment, support staff and site/side marked as  required. Intubation method: direct Patient status: paralyzed (RSI) Preoxygenation: BVM Sedatives: etomidate Paralytic: succinylcholine Laryngoscope size: Mac 4 Tube size: 7.5 mm Tube type: cuffed Number of attempts: 2 Ventilation between attempts: BVM Post-procedure assessment: chest rise and CO2 detector Breath sounds: equal and absent over the epigastrium Cuff inflated: yes ETT to lip: 21 cm Tube secured with: ETT holder Patient tolerance: Patient tolerated the procedure well with no immediate complications.   (including critical care time)  Labs Reviewed  COMPREHENSIVE METABOLIC PANEL - Abnormal; Notable for the following:    Glucose, Bld 54 (*)    GFR calc non Af Amer 68 (*)    GFR calc Af Amer 79 (*)    All other components within normal limits  SALICYLATE LEVEL - Abnormal; Notable for the following:    Salicylate Lvl <2.0 (*)    All other components within normal limits  ETHANOL - Abnormal; Notable for the following:    Alcohol, Ethyl (B) 154 (*)    All other components within normal limits  GLUCOSE, CAPILLARY - Abnormal; Notable for the following:    Glucose-Capillary 61 (*)    All other components within normal limits  CBC  DIFFERENTIAL  PROTIME-INR  APTT  ACETAMINOPHEN LEVEL  URINE RAPID DRUG SCREEN (HOSP PERFORMED)  POCT CBG MONITORING  ACETAMINOPHEN LEVEL   No results found.  1. Overdose   2. Altered mental status   3. Alcohol intoxication       MDM  Assumed care of patient on signout He became more somnolent, confused, s/p overdose I intubated patient and placed OG tube without incident Will call critical care for admission  CRITICAL CARE Performed by: Joya Gaskins   Total critical care time: 45  Critical care time was exclusive of separately billable procedures and treating other patients.  Critical care was necessary to treat or prevent imminent or life-threatening deterioration.  Critical care was time spent personally by me  on the following activities: development of treatment plan with patient and/or surrogate as well as nursing, discussions with consultants, evaluation of patient's response to treatment, examination of patient, obtaining history from patient or surrogate, ordering and performing treatments and interventions, ordering and review of laboratory studies, ordering and review of radiographic studies, pulse oximetry and re-evaluation of patient's condition.          Joya Gaskins, MD 05/20/11 [phone]

## 2011-05-21 ENCOUNTER — Inpatient Hospital Stay (HOSPITAL_COMMUNITY): Payer: Medicare Other

## 2011-05-21 ENCOUNTER — Encounter (HOSPITAL_COMMUNITY): Payer: Self-pay

## 2011-05-21 DIAGNOSIS — T50904A Poisoning by unspecified drugs, medicaments and biological substances, undetermined, initial encounter: Secondary | ICD-10-CM

## 2011-05-21 DIAGNOSIS — F1010 Alcohol abuse, uncomplicated: Secondary | ICD-10-CM

## 2011-05-21 DIAGNOSIS — T450X4A Poisoning by antiallergic and antiemetic drugs, undetermined, initial encounter: Principal | ICD-10-CM

## 2011-05-21 DIAGNOSIS — J9600 Acute respiratory failure, unspecified whether with hypoxia or hypercapnia: Secondary | ICD-10-CM

## 2011-05-21 DIAGNOSIS — F329 Major depressive disorder, single episode, unspecified: Secondary | ICD-10-CM

## 2011-05-21 DIAGNOSIS — T50991A Poisoning by other drugs, medicaments and biological substances, accidental (unintentional), initial encounter: Secondary | ICD-10-CM

## 2011-05-21 DIAGNOSIS — T50901A Poisoning by unspecified drugs, medicaments and biological substances, accidental (unintentional), initial encounter: Secondary | ICD-10-CM

## 2011-05-21 LAB — BASIC METABOLIC PANEL
BUN: 8 mg/dL (ref 6–23)
CO2: 22 mEq/L (ref 19–32)
Calcium: 8.1 mg/dL — ABNORMAL LOW (ref 8.4–10.5)
Chloride: 108 mEq/L (ref 96–112)
Creatinine, Ser: 1.08 mg/dL (ref 0.50–1.35)
GFR calc Af Amer: 87 mL/min — ABNORMAL LOW (ref >90)
GFR calc non Af Amer: 75 mL/min — ABNORMAL LOW (ref >90)
Glucose, Bld: 113 mg/dL — ABNORMAL HIGH (ref 70–99)
Potassium: 3.4 mEq/L — ABNORMAL LOW (ref 3.5–5.1)
Sodium: 138 mEq/L (ref 135–145)

## 2011-05-21 LAB — GLUCOSE, CAPILLARY
Glucose-Capillary: 115 mg/dL — ABNORMAL HIGH (ref 70–99)
Glucose-Capillary: 105 mg/dL — ABNORMAL HIGH (ref 70–99)
Glucose-Capillary: 106 mg/dL — ABNORMAL HIGH (ref 70–99)
Glucose-Capillary: 96 mg/dL (ref 70–99)
Glucose-Capillary: 109 mg/dL — ABNORMAL HIGH (ref 70–99)

## 2011-05-21 LAB — CBC
HCT: 37.1 % — ABNORMAL LOW (ref 39.0–52.0)
Hemoglobin: 12.4 g/dL — ABNORMAL LOW (ref 13.0–17.0)
MCH: 29.6 pg (ref 26.0–34.0)
MCHC: 33.4 g/dL (ref 30.0–36.0)
MCV: 88.5 fL (ref 78.0–100.0)
Platelets: 173 10*3/uL (ref 150–400)
RBC: 4.19 MIL/uL — ABNORMAL LOW (ref 4.22–5.81)
RDW: 14.8 % (ref 11.5–15.5)
WBC: 8.5 10*3/uL (ref 4.0–10.5)

## 2011-05-21 MED ORDER — PROPOFOL 10 MG/ML IV EMUL
INTRAVENOUS | Status: AC
  Administered 2011-05-21: 25 ug/kg/min via INTRAVENOUS
  Filled 2011-05-21: qty 50

## 2011-05-21 MED ORDER — INFLUENZA VIRUS VACC SPLIT PF IM SUSP
0.5000 mL | INTRAMUSCULAR | Status: DC
  Filled 2011-05-21: qty 0.5

## 2011-05-21 MED ORDER — PNEUMOCOCCAL VAC POLYVALENT 25 MCG/0.5ML IJ INJ
0.5000 mL | INJECTION | INTRAMUSCULAR | Status: DC
  Filled 2011-05-21: qty 0.5

## 2011-05-21 MED ORDER — BIOTENE DRY MOUTH MT LIQD
15.0000 mL | Freq: Two times a day (BID) | OROMUCOSAL | Status: DC
  Administered 2011-05-21: 15 mL via OROMUCOSAL

## 2011-05-21 MED ORDER — POTASSIUM CHLORIDE 10 MEQ/100ML IV SOLN
10.0000 meq | INTRAVENOUS | Status: AC
  Administered 2011-05-21 (×2): 10 meq via INTRAVENOUS
  Filled 2011-05-21: qty 200

## 2011-05-21 NOTE — Plan of Care (Signed)
Problem: Phase II Progression Outcomes Goal: Time pt extubated/weaned off vent Outcome: Completed/Met Date Met:  05/21/11 Extubated at 1115 Goal: Tolerating prescribed nutrition plan Outcome: Completed/Met Date Met:  05/21/11 Regular diet after extubation.

## 2011-05-21 NOTE — Procedures (Signed)
Extubation Procedure Note  Patient Details:   Name: [name] DOB: [birth year] MRN: [id number]   Airway Documentation:  Airway (Active)  Secured at (cm) 22 cm 05/21/2011  8:17 AM  Measured From Lips 05/21/2011  8:17 AM  Secured Location Left 05/21/2011  8:17 AM  Secured By [employer] 05/21/2011  8:17 AM  Tube Holder Repositioned Yes 05/21/2011  8:17 AM  Cuff Pressure (cm H2O) 26 cm H2O 05/21/2011  4:25 AM    Evaluation  O2 sats: O2 sats:stable throughout Complications: none Patient  tolerated procedure well. BBS clear ABLE TO SPEAK Pt extubated per order, placed in sitting position, suctioned, cuff leak detected, Pt was stable throughout the procedure, placed on 4L nasal cannula, titrated to 2L. sats 99%.   Revonda Humphrey 05/21/2011, 10:51 AM

## 2011-05-21 NOTE — Progress Notes (Signed)
Met with Pt regarding current hospitalization.  Pt reports that he took 2 packs of OTC sleeping pills because he wants to "sleep and never wake up."  Pt reports that he recently d/c'd from [facility]; he was hospitalized for SI.  Pt reports numerous hospitalizations at [facility] due to suicide attempts, SI and ETOH abuse.  Pt reports several suicide attempts by OD.   Pt and his wife divorced in July due to Pt's wife being, per Pt, tired of his depression.  Pt states they were married for 13 1/2 years and that they have a 47 year old son.  Pt reports that he sees his son every couple of weeks.  Pt reports that he purchased and drank almost all of a 12-pack on Sat before the OD.  He states that he hadn't had a drink in 2 months prior to that night.  Per Pt, he can't tolerate ETOH anymore.  Pt is a retired Naval architect (retired in 4540).  He receives a check from [employer] and from disability.  He lives at [facility] and has done so for the past 2 years.    Pt reports that he has no family in the area, no support.  He has a half brother that resides in [place] but they don't communicate with one another.  Pt's mother died in 25-Sep-1999.  He has a sister that lives in [place], with whom he doesn't communicate.  Pt is amenable to inpt tx.  Pt gave CSW permission to contact Mrs. Earlene Plater, owner of [employer], at [phone].  Spoke with Mrs. Davis.  She stated that Pt has bouts of depression.  She stated that he left Saturday to get some beer and go to a hotel.  He provided her with the name of the hotel and the room number. The next day, she was inforemd by the hospital of his condition.    Mrs. Earlene Plater stated that Pt will get really quiet and lay in the bed in the fetal position when depressed.  She stated that he does respond well to being told that he has his son to live for.  Mrs. Earlene Plater stated that they had no indication that Pt was having suicidal thoughts.  CSW thanked Mrs. Davis for her  time.  Providence Crosby, LCSWA Clinical Social Work [phone]

## 2011-05-21 NOTE — ED Provider Notes (Signed)
Medical screening examination/treatment/procedure(s) were conducted as a shared visit with non-physician practitioner(s) and myself.  I personally evaluated the patient during the encounter   Loren Racer, MD 05/21/11 [phone]

## 2011-05-21 NOTE — Progress Notes (Signed)
[name] is an 56 y.o. male.    Chief Complaint: Antihistamine intoxication, acute respiratory failure HPI: Pt is a 56 y/o M with PMHx of depression with prior suicidal attempts, who came to [facility] and reported that he took between 75 to 90 tabs of Unisom (Doxalamine). Pt was initially mildly confused but responsive and then he became more lethargic, had nausea/vomiting and less responsive and was intubated for airway protection. Blood sugars were low 54's and receive D50.  CXR after intubation showed mild atelectasis at the bases. Further workup showed negative acetaminophen level, salycilates and high alcohol level.   Lines: ETT:1/27>>1/28 Foley: 1/27 >>>  Cultures:  Antibiotics:  Interval/Subjective: Patient rested well over night, sedated on the vent.   Blood pressure 127/69, pulse 99, temperature 98.9 F (37.2 C), temperature source Oral, resp. rate 17, height 5\' 11"  (1.803 m), weight 88.4 kg (194 lb 14.2 oz), SpO2 97.00%.   Intake/Output Summary (Last 24 hours) at 05/21/11 0837 Last data filed at 05/21/11 0700  Gross per 24 hour  Intake 3592.07 ml  Output   1775 ml  Net 1817.07 ml  Physical Exam  General: Pt is intubated in no acute distress HEENT: ETT/OGT CV: RRR, no murmmurs Resp: Diffuse Rhonchi  Abdomen: distended, non-tender, bowel sounds hypoactive Extrem: No edema Neurol: Arouses to voice, follows commands  Assessment/Plan  Acute Respiratory Failure- secondary to Unisom/Doxylamine and alcohol intoxication -Extubate -Pulmonary hygiene    Overdose- Intentional  -Psych consult -called -Safety sitter   Antihistamine toxicity -EKG- QTc 0.42  d/c EKG's -f/u poison control recs   Alcohol Intoxication- questionable for history of abuse - monitor for DTs - Continue banana bag  -psych consult when able   Depression- Chronic - Psych consult when able -wait for psych recs to restart home meds  Best practices / Disposition:  -->ICU status with PCCM-  transfer to [place] -->Vent Bundle -->SCD for DVT Px  -->Protonix for GI Px  -->diet NPO -->No family at bedside  [name],Alexander 05/21/2011, 8:35 AM  Alexander Pupa, MD, PhD 05/21/2011, 2:11 PM [facility] [phone] or if no answer [phone]

## 2011-05-21 NOTE — Clinical Documentation Improvement (Signed)
CHANGE MENTAL STATUS DOCUMENTATION CLARIFICATION   THIS DOCUMENT IS NOT A PERMANENT PART OF THE MEDICAL RECORD  TO RESPOND TO THE THIS QUERY, FOLLOW THE INSTRUCTIONS BELOW:  1. If needed, update documentation for the patient's encounter via the notes activity.  2. Access this query again and click edit on the In [employer].  3. After updating, or not, click F2 to complete all highlighted (required) fields concerning your review. Select "additional documentation in the medical record" OR "no additional documentation provided".  4. Click Sign note button.  5. The deficiency will fall out of your In Basket *Please let us know if you are not able to complete this workflow by phone or e-mail (listed below).         05/21/11  Dear Dr. Sherene Sires Marton Redwood  In an effort to better capture your patient's severity of illness, reflect appropriate length of stay and utilization of resources, a review of the patient medical record has revealed the following indicators.    Based on your clinical judgment, please clarify and document in a progress note and/or discharge summary the clinical condition associated with the following supporting information:  In responding to this query please exercise your independent judgment.  The fact that a query is asked, does not imply that any particular answer is desired or expected.  Possible Clinical Conditions?  _______Encephalopathy (describe type if known)                       Anoxic                       Septic                       Alcoholic                        Hepatic                       Hypertensive                       Metabolic                       _______ Toxic _______Drug induced Encephalopathy _______Poisoning / Overdose _______Other Condition__________________ _______Cannot Clinically Determine   Supporting Information:  Risk Factors: H/P Acute Respiratory Failure - Due to drug intoxication (Doxalamine) and alcohol  intoxication Doxalamine Intoxication Possible risk of aspiration pneumonitis due to AMS and nausea/vomiting before intubation   Signs & Symptoms: H/p Pt was initially mildly confused but responsive and then he became more lethargic, had nausea/vomiting and less responsive and was intubated for airway protection.   Diagnostics: 05/20/11 IMPRESSION: 1.  Marked distension of the colon suggestive of an ileus though a distal obstruction is not entirely excluded on the basis of this examination  Treatment: Monitoring  Reviewed: Agree  Thank You,  Enis Slipper  RN, BSN, CCDS Clinical Documentation Specialist [facility] Dept Pager: [phone] / E-mail: [email or web address]  Health Information Management [place]   Agree, will do

## 2011-05-21 NOTE — Progress Notes (Signed)
Psych MD recommends [facility] for inpt tx.  Discussed this recommendation with Pt.  Pt agreeable to [name].  CSW contacted [name] re: documents needed to consider Pt for inpt tx.  [facility] requesting psych Ax, demographic and insurance info and labs.  CSW faxed all of the above, save psych Ax, as psych MD has yet to dictate her note.  CSW to continue to follow.  Providence Crosby, LCSWA Clinical Social Work [phone]

## 2011-05-21 NOTE — Consult Note (Signed)
Reason for Consult: Suicidal ideation, overdose Referring Physician: Dr. Emilio Aspen is an 56 y.o. male.  HPI: Patient was sick of living and took 2 boxes of over-the-counter sleeping medicine. He wanted to go to sleep and never wake up. AXIS  I  Depression due to medical condition; suicidal ideation with overdose  AXIS  II deferred AXIS III Past Medical History  Diagnosis Date  . Mental disorder   . Depression   . Neuromuscular disorder   . Multiple sclerosis, relapsing-remitting     Past Surgical History  Procedure Date  . Tonsillectomy   AXIS IV  divorce, parenting, immobility, medical condition, lack of social interaction. In  AXIS V [id number]  History reviewed. No pertinent family history.  Social History:  reports that he has been smoking Cigarettes.  He has a 18 pack-year smoking history. He has never used smokeless tobacco. He reports that he does not drink alcohol or use illicit drugs.  Allergies: No Known Allergies  Medications: I have reviewed the patient's current medications.  Results for orders placed during the hospital encounter of 05/19/11 (from the past 48 hour(s))  GLUCOSE, CAPILLARY     Status: Abnormal   Collection Time   05/19/11  9:22 PM      Component Value Range Comment   Glucose-Capillary 61 (*) 70 - 99 (mg/dL)   CBC     Status: Normal   Collection Time   05/19/11  9:30 PM      Component Value Range Comment   WBC 6.0  4.0 - 10.5 (K/uL)    RBC 4.84  4.22 - 5.81 (MIL/uL)    Hemoglobin 14.4  13.0 - 17.0 (g/dL)    HCT 16.1  09.6 - 04.5 (%)    MCV 87.6  78.0 - 100.0 (fL)    MCH 29.8  26.0 - 34.0 (pg)    MCHC 34.0  30.0 - 36.0 (g/dL)    RDW 40.9  81.1 - 91.4 (%)    Platelets 218  150 - 400 (K/uL)   DIFFERENTIAL     Status: Normal   Collection Time   05/19/11  9:30 PM      Component Value Range Comment   Neutrophils Relative 54  43 - 77 (%)    Neutro Abs 3.2  1.7 - 7.7 (K/uL)    Lymphocytes Relative 37  12 - 46 (%)    Lymphs Abs 2.2  0.7 -  4.0 (K/uL)    Monocytes Relative 7  3 - 12 (%)    Monocytes Absolute 0.4  0.1 - 1.0 (K/uL)    Eosinophils Relative 2  0 - 5 (%)    Eosinophils Absolute 0.1  0.0 - 0.7 (K/uL)    Basophils Relative 0  0 - 1 (%)    Basophils Absolute 0.0  0.0 - 0.1 (K/uL)   COMPREHENSIVE METABOLIC PANEL     Status: Abnormal   Collection Time   05/19/11  9:30 PM      Component Value Range Comment   Sodium 136  135 - 145 (mEq/L)    Potassium 3.7  3.5 - 5.1 (mEq/L)    Chloride 101  96 - 112 (mEq/L)    CO2 25  19 - 32 (mEq/L)    Glucose, Bld 54 (*) 70 - 99 (mg/dL)    BUN 10  6 - 23 (mg/dL)    Creatinine, Ser 7.82  0.50 - 1.35 (mg/dL)    Calcium 8.8  8.4 - 10.5 (mg/dL)  Total Protein 7.1  6.0 - 8.3 (g/dL)    Albumin 3.9  3.5 - 5.2 (g/dL)    AST 13  0 - 37 (U/L)    ALT 12  0 - 53 (U/L)    Alkaline Phosphatase 69  39 - 117 (U/L)    Total Bilirubin 0.6  0.3 - 1.2 (mg/dL)    GFR calc non Af Amer 68 (*) >90 (mL/min)    GFR calc Af Amer 79 (*) >90 (mL/min)   PROTIME-INR     Status: Normal   Collection Time   05/19/11  9:30 PM      Component Value Range Comment   Prothrombin Time 12.5  11.6 - 15.2 (seconds)    INR 0.91  0.00 - 1.49    APTT     Status: Normal   Collection Time   05/19/11  9:30 PM      Component Value Range Comment   aPTT 30  24 - 37 (seconds)   ACETAMINOPHEN LEVEL     Status: Normal   Collection Time   05/19/11  9:30 PM      Component Value Range Comment   Acetaminophen (Tylenol), Serum <15.0  10 - 30 (ug/mL)   SALICYLATE LEVEL     Status: Abnormal   Collection Time   05/19/11  9:30 PM      Component Value Range Comment   Salicylate Lvl <2.0 (*) 2.8 - 20.0 (mg/dL)   ETHANOL     Status: Abnormal   Collection Time   05/19/11  9:30 PM      Component Value Range Comment   Alcohol, Ethyl (B) 154 (*) 0 - 11 (mg/dL)   URINE RAPID DRUG SCREEN (HOSP PERFORMED)     Status: Normal   Collection Time   05/20/11 12:20 AM      Component Value Range Comment   Opiates NONE DETECTED  NONE DETECTED      Cocaine NONE DETECTED  NONE DETECTED     Benzodiazepines NONE DETECTED  NONE DETECTED     Amphetamines NONE DETECTED  NONE DETECTED     Tetrahydrocannabinol NONE DETECTED  NONE DETECTED     Barbiturates NONE DETECTED  NONE DETECTED    GLUCOSE, CAPILLARY     Status: Normal   Collection Time   05/20/11  1:10 AM      Component Value Range Comment   Glucose-Capillary 73  70 - 99 (mg/dL)   BLOOD GAS, ARTERIAL     Status: Abnormal   Collection Time   05/20/11  1:57 AM      Component Value Range Comment   FIO2 1.00      Delivery systems VENTILATOR      Mode PRESSURE REGULATED VOLUME CONTROL      VT 560      Rate 16      Peep/cpap 5.0      pH, Arterial 7.309 (*) 7.350 - 7.450     pCO2 arterial 44.2  35.0 - 45.0 (mmHg)    pO2, Arterial 397.0 (*) 80.0 - 100.0 (mmHg)    Bicarbonate 21.5  20.0 - 24.0 (mEq/L)    TCO2 19.8  0 - 100 (mmol/L)    Acid-base deficit 4.1 (*) 0.0 - 2.0 (mmol/L)    O2 Saturation 99.4      Patient temperature 98.6      Collection site RIGHT RADIAL      Drawn by [id number]      Sample type ARTERIAL DRAW  Allens test (pass/fail) PASS  PASS    ACETAMINOPHEN LEVEL     Status: Normal   Collection Time   05/20/11  2:15 AM      Component Value Range Comment   Acetaminophen (Tylenol), Serum <15.0  10 - 30 (ug/mL)   BLOOD GAS, ARTERIAL     Status: Abnormal   Collection Time   05/20/11  3:04 AM      Component Value Range Comment   FIO2 0.40      Delivery systems VENTILATOR      Mode PRESSURE REGULATED VOLUME CONTROL      VT 560      Rate 18      Peep/cpap 5.0      pH, Arterial 7.366  7.350 - 7.450     pCO2 arterial 36.0  35.0 - 45.0 (mmHg)    pO2, Arterial 74.2 (*) 80.0 - 100.0 (mmHg)    Bicarbonate 20.1  20.0 - 24.0 (mEq/L)    TCO2 18.3  0 - 100 (mmol/L)    Acid-base deficit 4.1 (*) 0.0 - 2.0 (mmol/L)    O2 Saturation 95.3      Patient temperature 98.6      Collection site RIGHT RADIAL      Drawn by [id number]      Sample type ARTERIAL DRAW      Allens test  (pass/fail) PASS  PASS    GLUCOSE, CAPILLARY     Status: Abnormal   Collection Time   05/20/11  6:14 AM      Component Value Range Comment   Glucose-Capillary 63 (*) 70 - 99 (mg/dL)   CK     Status: Abnormal   Collection Time   05/20/11  6:15 AM      Component Value Range Comment   Total CK 486 (*) 7 - 232 (U/L)   COMPREHENSIVE METABOLIC PANEL     Status: Abnormal   Collection Time   05/20/11  6:51 AM      Component Value Range Comment   Sodium 138  135 - 145 (mEq/L)    Potassium 3.8  3.5 - 5.1 (mEq/L)    Chloride 107  96 - 112 (mEq/L)    CO2 23  19 - 32 (mEq/L)    Glucose, Bld 147 (*) 70 - 99 (mg/dL)    BUN 9  6 - 23 (mg/dL)    Creatinine, Ser 0.45  0.50 - 1.35 (mg/dL)    Calcium 8.0 (*) 8.4 - 10.5 (mg/dL)    Total Protein 5.9 (*) 6.0 - 8.3 (g/dL)    Albumin 3.3 (*) 3.5 - 5.2 (g/dL)    AST 16  0 - 37 (U/L)    ALT 10  0 - 53 (U/L)    Alkaline Phosphatase 56  39 - 117 (U/L)    Total Bilirubin 0.3  0.3 - 1.2 (mg/dL)    GFR calc non Af Amer 83 (*) >90 (mL/min)    GFR calc Af Amer >90  >90 (mL/min)   MAGNESIUM     Status: Normal   Collection Time   05/20/11  6:51 AM      Component Value Range Comment   Magnesium 2.5  1.5 - 2.5 (mg/dL)   PHOSPHORUS     Status: Normal   Collection Time   05/20/11  6:51 AM      Component Value Range Comment   Phosphorus 3.9  2.3 - 4.6 (mg/dL)   CBC     Status: Abnormal   Collection Time  05/20/11  6:51 AM      Component Value Range Comment   WBC 7.1  4.0 - 10.5 (K/uL)    RBC 4.23  4.22 - 5.81 (MIL/uL)    Hemoglobin 12.2 (*) 13.0 - 17.0 (g/dL)    HCT 86.5 (*) 78.4 - 52.0 (%)    MCV 87.5  78.0 - 100.0 (fL)    MCH 28.8  26.0 - 34.0 (pg)    MCHC 33.0  30.0 - 36.0 (g/dL)    RDW 69.6  29.5 - 28.4 (%)    Platelets 184  150 - 400 (K/uL)   DIFFERENTIAL     Status: Normal   Collection Time   05/20/11  6:51 AM      Component Value Range Comment   Neutrophils Relative 66  43 - 77 (%)    Neutro Abs 4.6  1.7 - 7.7 (K/uL)    Lymphocytes Relative 25   12 - 46 (%)    Lymphs Abs 1.8  0.7 - 4.0 (K/uL)    Monocytes Relative 8  3 - 12 (%)    Monocytes Absolute 0.6  0.1 - 1.0 (K/uL)    Eosinophils Relative 1  0 - 5 (%)    Eosinophils Absolute 0.1  0.0 - 0.7 (K/uL)    Basophils Relative 0  0 - 1 (%)    Basophils Absolute 0.0  0.0 - 0.1 (K/uL)   BLOOD GAS, ARTERIAL     Status: Abnormal   Collection Time   05/20/11  7:00 AM      Component Value Range Comment   FIO2 0.40      Delivery systems VENTILATOR      Mode PRESSURE REGULATED VOLUME CONTROL      VT 600      Rate 18      Peep/cpap 5.0      pH, Arterial 7.385  7.350 - 7.450     pCO2 arterial 37.1  35.0 - 45.0 (mmHg)    pO2, Arterial 101.0 (*) 80.0 - 100.0 (mmHg)    Bicarbonate 21.7  20.0 - 24.0 (mEq/L)    TCO2 19.4  0 - 100 (mmol/L)    Acid-base deficit 2.4 (*) 0.0 - 2.0 (mmol/L)    O2 Saturation 98.5      Patient temperature 98.6      Collection site RIGHT RADIAL      Drawn by COLLECTED BY RT      Sample type ARTERIAL DRAW      Allens test (pass/fail) PASS  PASS    GLUCOSE, CAPILLARY     Status: Normal   Collection Time   05/20/11  7:59 AM      Component Value Range Comment   Glucose-Capillary 93  70 - 99 (mg/dL)   MRSA PCR SCREENING     Status: Normal   Collection Time   05/20/11  8:55 AM      Component Value Range Comment   MRSA by PCR NEGATIVE  NEGATIVE    GLUCOSE, CAPILLARY     Status: Abnormal   Collection Time   05/20/11 12:33 PM      Component Value Range Comment   Glucose-Capillary 68 (*) 70 - 99 (mg/dL)   GLUCOSE, CAPILLARY     Status: Abnormal   Collection Time   05/20/11  1:16 PM      Component Value Range Comment   Glucose-Capillary 101 (*) 70 - 99 (mg/dL)    Comment 1 Notify RN     GLUCOSE, CAPILLARY  Status: Normal   Collection Time   05/20/11  3:52 PM      Component Value Range Comment   Glucose-Capillary 80  70 - 99 (mg/dL)   GLUCOSE, CAPILLARY     Status: Normal   Collection Time   05/20/11  8:12 PM      Component Value Range Comment    Glucose-Capillary 92  70 - 99 (mg/dL)    Comment 1 Documented in Chart      Comment 2 Notify RN     GLUCOSE, CAPILLARY     Status: Abnormal   Collection Time   05/20/11 11:31 PM      Component Value Range Comment   Glucose-Capillary 111 (*) 70 - 99 (mg/dL)    Comment 1 Documented in Chart      Comment 2 Notify RN     BASIC METABOLIC PANEL     Status: Abnormal   Collection Time   05/21/11  3:08 AM      Component Value Range Comment   Sodium 138  135 - 145 (mEq/L)    Potassium 3.4 (*) 3.5 - 5.1 (mEq/L)    Chloride 108  96 - 112 (mEq/L)    CO2 22  19 - 32 (mEq/L)    Glucose, Bld 113 (*) 70 - 99 (mg/dL)    BUN 8  6 - 23 (mg/dL)    Creatinine, Ser 1.61  0.50 - 1.35 (mg/dL)    Calcium 8.1 (*) 8.4 - 10.5 (mg/dL)    GFR calc non Af Amer 75 (*) >90 (mL/min)    GFR calc Af Amer 87 (*) >90 (mL/min)   CBC     Status: Abnormal   Collection Time   05/21/11  3:08 AM      Component Value Range Comment   WBC 8.5  4.0 - 10.5 (K/uL)    RBC 4.19 (*) 4.22 - 5.81 (MIL/uL)    Hemoglobin 12.4 (*) 13.0 - 17.0 (g/dL)    HCT 09.6 (*) 04.5 - 52.0 (%)    MCV 88.5  78.0 - 100.0 (fL)    MCH 29.6  26.0 - 34.0 (pg)    MCHC 33.4  30.0 - 36.0 (g/dL)    RDW 40.9  81.1 - 91.4 (%)    Platelets 173  150 - 400 (K/uL)   GLUCOSE, CAPILLARY     Status: Abnormal   Collection Time   05/21/11  4:16 AM      Component Value Range Comment   Glucose-Capillary 115 (*) 70 - 99 (mg/dL)    Comment 1 Documented in Chart      Comment 2 Notify RN     GLUCOSE, CAPILLARY     Status: Abnormal   Collection Time   05/21/11  7:24 AM      Component Value Range Comment   Glucose-Capillary 105 (*) 70 - 99 (mg/dL)    Comment 1 Documented in Chart      Comment 2 Notify RN     GLUCOSE, CAPILLARY     Status: Abnormal   Collection Time   05/21/11 12:08 PM      Component Value Range Comment   Glucose-Capillary 106 (*) 70 - 99 (mg/dL)    Comment 1 Documented in Chart      Comment 2 Notify RN     GLUCOSE, CAPILLARY     Status: Normal    Collection Time   05/21/11  3:57 PM      Component Value Range Comment   Glucose-Capillary 96  70 - 99 (mg/dL)    Comment 1 Documented in Chart      Comment 2 Notify RN     GLUCOSE, CAPILLARY     Status: Abnormal   Collection Time   05/21/11  7:25 PM      Component Value Range Comment   Glucose-Capillary 109 (*) 70 - 99 (mg/dL)    Comment 1 Documented in Chart      Comment 2 Notify RN       Dg Chest Port 1 View  05/21/2011  *RADIOLOGY REPORT*  Clinical Data: Evaluate for aspiration  PORTABLE CHEST - 1 VIEW  Comparison: 05/20/2011; 12/20/2010; 09/05/2010  Findings: Grossly unchanged cardiac silhouette and mediastinal contours.  Stable positioning of support apparatus.  Interval increase and bilateral mid lung linear heterogeneous opacities favored to represent subsegmental atelectasis.  No focal airspace opacities. No definite evidence of pulmonary edema.  No definite pleural effusion or pneumothorax.  Grossly unchanged bones.  IMPRESSION: 1.  Stable positioning of support apparatus.  No pneumothorax. 2.  Minimal increase in bilateral mid lung linear heterogeneous opacities favored to represent subsegmental atelectasis. No definite evidence of aspiration.  Original Report Authenticated By: Waynard Reeds, M.D.   Dg Chest Portable 1 View  05/20/2011  *RADIOLOGY REPORT*  Clinical Data: Acute respiratory failure.  Intubation.  PORTABLE CHEST - 1 VIEW  Comparison: 12/20/2010  Findings: The endotracheal tube is seen in appropriate position, with tip in the mid thoracic trachea.  Nasogastric tube is seen entering the stomach.  Low lung volumes are seen with mild bibasilar atelectasis.  No evidence of pulmonary consolidation or pleural effusion.  Heart size is within normal limits.  IMPRESSION:  1.  Endotracheal tube and nasogastric tube in appropriate position. 2.  Low lung volumes and mild bibasilar atelectasis.  Original Report Authenticated By: Danae Orleans, M.D.   Dg Abd Portable 1v  05/20/2011   *RADIOLOGY REPORT*  Clinical Data: Abdominal distension  PORTABLE ABDOMEN - 1 VIEW  Comparison: 12/09/2006  Findings:  There is marked gaseous distension of the colon with index of bowel measuring 6.2 cm in diameter.  An enteric tube terminates over the expected location of the gastric antrum.  Evaluation of pneumoperitoneum is limited secondary to supine patient positioning exclusion of the lower thorax.  No acute osseous abnormalities.  IMPRESSION: 1.  Marked distension of the colon suggestive of an ileus though a distal obstruction is not entirely excluded on the basis of this examination. 2.  Evaluation for pneumoperitoneum is limited secondary to supine patient positioning and exclusion of the lower thorax.  Further evaluation may be obtained with decubitus radiographs as clinically indicated.  Original Report Authenticated By: Waynard Reeds, M.D.    Review of Systems  Unable to perform ROS: other   Blood pressure 118/62, pulse 93, temperature 98.5 F (36.9 C), temperature source Oral, resp. rate 26, height 5\' 11"  (1.803 m), weight 88.4 kg (194 lb 14.2 oz), SpO2 95.00%. Physical Exam  Assessment/Plan: Chart is reviewed and PSYCH CSW note is appreciated and interview is discussed with Child psychotherapist. The patient is awake alert and states that he has recently been extubated. He has a normal volume and rate of speech. He has good eye contact and states "life sucks" and that's why he wanted to die. He says he has a 49 year old son who loves to visit at his group home and play video games with him. He dismisses the possibility that his son would be traumatized by his father's  death. He states "he has his mother". He relates that he had a job as a [employer] architect and began having double vision about 15 years ago and was able to continue driving until the condition affecting his legs. 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 He's had flares of this degenerating illness about once a  year for the past 5 years. The last episode he had was in July. He admits this is in bad for the f illness thinking that it could be much worse. His wife divorced him and he lives in a group home and goes to a center where he says he can have more social contact. She admits that he's not very comfortable in social settings. He does enjoy the visits with his son.  He said he has been to many psychiatric inpatient facilities and the last one at [facility] did not seem very helpful. He does agree today to transfer, when stable to an inpatient psychiatric facility.   RECOMMENDATION: 1. This patient has the capacity to make decisions on his in his own behalf 2. This patient agrees to inpatient psychiatric care in an appropriate facility. 3. It is noted this patient is not taking an antidepressant medication. If he has one at the group home, it should be restarted. If not, once he gets to the inpatient psychiatric facility one could be administered. 4. No further psychiatric need is identified. M.D. psychiatrist to signing off.  Sheilyn Boehlke 05/21/2011, 8:44 PM

## 2011-05-22 LAB — BASIC METABOLIC PANEL
BUN: 9 mg/dL (ref 6–23)
CO2: 22 mEq/L (ref 19–32)
Calcium: 8.5 mg/dL (ref 8.4–10.5)
Chloride: 107 mEq/L (ref 96–112)
Creatinine, Ser: 0.95 mg/dL (ref 0.50–1.35)
GFR calc Af Amer: >90 mL/min (ref >90)
GFR calc non Af Amer: >90 mL/min (ref >90)
Glucose, Bld: 92 mg/dL (ref 70–99)
Potassium: 3.6 mEq/L (ref 3.5–5.1)
Sodium: 137 mEq/L (ref 135–145)

## 2011-05-22 LAB — CBC
HCT: 37.2 % — ABNORMAL LOW (ref 39.0–52.0)
Hemoglobin: 12.3 g/dL — ABNORMAL LOW (ref 13.0–17.0)
MCH: 29.2 pg (ref 26.0–34.0)
MCHC: 33.1 g/dL (ref 30.0–36.0)
MCV: 88.4 fL (ref 78.0–100.0)
Platelets: 157 10*3/uL (ref 150–400)
RBC: 4.21 MIL/uL — ABNORMAL LOW (ref 4.22–5.81)
RDW: 14.5 % (ref 11.5–15.5)
WBC: 8.9 10*3/uL (ref 4.0–10.5)

## 2011-05-22 NOTE — Discharge Summary (Signed)
Patient ID: JACY HOWAT MRN: [id number] DOB/AGE: 1955-08-04 56 y.o.  Admit date: 05/19/2011 Discharge date: 05/22/2011  Primary Care Physician:  No primary provider on file.  Discharge Diagnoses:    Present on Admission:  .Acute respiratory failure .Intoxication by drug .Depression  Principal Problem:  *Intoxication by drug Active Problems:  Acute respiratory failure  Depression   Medication List  As of 05/22/2011 11:34 AM   TAKE these medications         amantadine 100 MG capsule   Commonly known as: SYMMETREL   Take 100 mg by mouth 2 (two) times daily.      buPROPion 150 MG 24 hr tablet   Commonly known as: WELLBUTRIN XL   Take 1 tablet (150 mg total) by mouth daily. For depression      citalopram 40 MG tablet   Commonly known as: CELEXA   Take 40 mg by mouth daily.      doxylamine (Sleep) 25 MG tablet   Commonly known as: UNISOM   Take 25 mg by mouth at bedtime as needed.      GILENYA 0.5 MG Caps   Generic drug: Fingolimod HCl   Take 0.5 capsules by mouth daily.      hydrOXYzine 50 MG capsule   Commonly known as: VISTARIL   Take 50 mg by mouth at bedtime.      risperiDONE 2 MG tablet   Commonly known as: RISPERDAL   Take 2 mg by mouth at bedtime.            Disposition and Follow-up: Pt will be discharged at [facility]. Pt has been accepted by Dr.Carlton.    Consults:  PCCM (critical care), Internal medicine, Psychiatry  Significant Diagnostic Studies:   Dg Chest Portable 1 View 05/20/2011   IMPRESSION:  1.  Endotracheal tube and nasogastric tube in appropriate position. 2.  Low lung volumes and mild bibasilar atelectasis.   Dg Abd Portable 1v  05/20/2011   IMPRESSION: 1.  Marked distension of the colon suggestive of an ileus though a distal obstruction is not entirely excluded on the basis of this examination. 2.  Evaluation for pneumoperitoneum is limited secondary to supine patient positioning and exclusion of the lower thorax.  Further  evaluation may be obtained with decubitus radiographs as clinically indicated.   Brief H and P:  Pt is a 56 y/o M with PMHx of depression with prior suicidal attempts, who came to [facility] and reported that he took between 75 to 90 tabs of Unisom (Doxalamine). Pt was initially mildly confused but responsive and then he became more lethargic, had nausea/vomiting and less responsive and was intubated for airway protection. Blood sugars were low 54's and receive D50. CXR after intubation showed mild atelectasis at the bases. Further workup showed negative acetaminophen level, salycilates and high alcohol level.  Physical Exam on Discharge:  Filed Vitals:   05/21/11 1800 05/21/11 1900 05/21/11 2000 05/21/11 2135  BP: 118/63  118/62 127/63  Pulse: 95 94 93 104  Temp:   98.5 F (36.9 C) 99.3 F (37.4 C)  TempSrc:   Oral Oral  Resp: 26 23 26 20   Height:      Weight:      SpO2: 94% 95% 95% 90%     Intake/Output Summary (Last 24 hours) at 05/22/11 1134 Last data filed at 05/22/11 0854  Gross per 24 hour  Intake    280 ml  Output      1 ml  Net  279 ml    General: Alert, awake, oriented x3, in no acute distress. HEENT: No bruits, no goiter. Heart: Regular rate and rhythm, without murmurs, rubs, gallops. Lungs: Clear to auscultation bilaterally. Abdomen: Soft, nontender, nondistended, positive bowel sounds. Extremities: No clubbing cyanosis or edema with positive pedal pulses. Neuro: Grossly intact, nonfocal.  CBC:    Component Value Date/Time   WBC 8.9 05/22/2011 0447   HGB 12.3* 05/22/2011 0447   HCT 37.2* 05/22/2011 0447   PLT 157 05/22/2011 0447   MCV 88.4 05/22/2011 0447   NEUTROABS 4.6 05/20/2011 0651   LYMPHSABS 1.8 05/20/2011 0651   MONOABS 0.6 05/20/2011 0651   EOSABS 0.1 05/20/2011 0651   BASOSABS 0.0 05/20/2011 0651    Basic Metabolic Panel:    Component Value Date/Time   NA 137 05/22/2011 0447   K 3.6 05/22/2011 0447   CL 107 05/22/2011 0447   CO2 22 05/22/2011 0447   BUN  9 05/22/2011 0447   CREATININE 0.95 05/22/2011 0447   GLUCOSE 92 05/22/2011 0447   CALCIUM 8.5 05/22/2011 0447    Hospital Course:   1. Acute Respiratory Failure  - this was due to drug intoxication (Doxalamine) and alcohol intoxication  - Pt was intubated for airway protection upon admission - he has responded well to clinical measures and was successfully extubated and has maintained oxygen saturations > 95% on RA  2. Doxalamine Intoxication  - Antihistamine toxicity  - EKG with QTc >500 on admission, - Pt was given Mag sulfate 2g IV  - Follow up EKG with resolution and normal sinus rhythm upon discharge - No charcoal on admission required  3. Alcohol Intoxication  - Possible Hx of alcohol abuse  - Intubated for airway protection  - Sedated with propofol  - Hypoglycemia on admission resolved with medical measures - No alcohol withdrawal during the stay - pt has tolerated PO intake well  4. Hypoglycemia  - Probably due to alcohol intoxication/drug overdose  - resolved upon discharge  5. Depression  - Hx of previous suicidal attempts.  - psych consult ordered and the recommendation was to place to rehabilitation for further care and management  - plan of care and diagnosis, diagnostic studies and test results were discussed with pt and family at bedside - pt and  family verbalized understanding   Time spent on Discharge: Over 30 minutes  Signed: Debbora Presto 05/22/2011, 11:34 AM   [phone]

## 2011-05-22 NOTE — Progress Notes (Signed)
Spoke with [name] at [facility].  Per [name], Pt has been accepted by Dr.Carlton.    Notified MD, RN and Pt.  RN to call report.  Faxed D/c summary.  Arranged for transportation with [facility].  Pt to be d/c'd.  Providence Crosby, LCSWA Clinical Social Work [phone]

## 2011-05-22 NOTE — Progress Notes (Signed)
[name] denied Pt due to Pt being intubated.  Spoke with Tomeika at [facility] and explained that Pt was extubated yesterday and is doing well.  Tomeika asked that CSW refax Pt's info.  Faxed demographics, psych consult, labs and insurance info, per [facility]'s request.  CSW to continue to follow.  Providence Crosby, LCSWA Clinical Social Work [phone]

## 2011-05-22 NOTE — Progress Notes (Signed)
CARE MANAGEMENT NOTE 05/22/2011  Patient:  [name], [name]   Account Number:  [id number]  Date Initiated:  05/22/2011  Documentation initiated by:  DAVIS,RHONDA  Subjective/Objective Assessment:   pt with overdose of unisom and requiring intubation due to respiratory depression has history depression and previous suicide attempts     Action/Plan:   live at home   Anticipated DC Date:  05/25/2011   Anticipated DC Plan:  [facility]  In-house referral  Clinical Social Worker      DC Planning Services  NA      [facility] Choice  NA   Choice offered to / List presented to:  NA   DME arranged  NA      DME agency  NA     HH arranged  NA      HH agency  NA   Status of service:  In process, will continue to follow Medicare Important Message given?  NA - LOS <3 / Initial given by admissions (If response is "NO", the following Medicare IM given date fields will be blank) Date Medicare IM given:   Date Additional Medicare IM given:    Discharge Disposition:    Per UR Regulation:  Reviewed for med. necessity/level of care/duration of stay  Comments:  [id number]/Rhonda Davius,RN,BSN,CCM

## 2011-07-04 ENCOUNTER — Encounter: Payer: Self-pay | Admitting: Gastroenterology

## 2011-08-21 ENCOUNTER — Encounter: Payer: Self-pay | Admitting: Gastroenterology

## 2011-10-05 ENCOUNTER — Encounter (HOSPITAL_COMMUNITY): Payer: Self-pay

## 2011-10-05 ENCOUNTER — Emergency Department (HOSPITAL_COMMUNITY)
Admission: EM | Admit: 2011-10-05 | Discharge: 2011-10-06 | Disposition: A | Discharge status: Other Acute Inpatient Hospital | Payer: 59 | Source: Home / Self Care | Attending: Emergency Medicine | Admitting: Emergency Medicine

## 2011-10-05 DIAGNOSIS — F3289 Other specified depressive episodes: Secondary | ICD-10-CM | POA: Insufficient documentation

## 2011-10-05 DIAGNOSIS — R45851 Suicidal ideations: Secondary | ICD-10-CM | POA: Insufficient documentation

## 2011-10-05 DIAGNOSIS — F17200 Nicotine dependence, unspecified, uncomplicated: Secondary | ICD-10-CM | POA: Insufficient documentation

## 2011-10-05 DIAGNOSIS — F329 Major depressive disorder, single episode, unspecified: Secondary | ICD-10-CM

## 2011-10-05 DIAGNOSIS — F32A Depression, unspecified: Secondary | ICD-10-CM

## 2011-10-05 LAB — URINALYSIS, ROUTINE W REFLEX MICROSCOPIC
Bilirubin Urine: NEGATIVE
Glucose, UA: NEGATIVE mg/dL
Hgb urine dipstick: NEGATIVE
Ketones, ur: NEGATIVE mg/dL
Leukocytes, UA: NEGATIVE
Nitrite: NEGATIVE
Protein, ur: NEGATIVE mg/dL
Specific Gravity, Urine: 1.015 (ref 1.005–1.030)
Urobilinogen, UA: 0.2 mg/dL (ref 0.0–1.0)
pH: 6 (ref 5.0–8.0)

## 2011-10-05 LAB — BASIC METABOLIC PANEL
BUN: 17 mg/dL (ref 6–23)
CO2: 26 mEq/L (ref 19–32)
Calcium: 9.1 mg/dL (ref 8.4–10.5)
Chloride: 105 mEq/L (ref 96–112)
Creatinine, Ser: 1.16 mg/dL (ref 0.50–1.35)
GFR calc Af Amer: 80 mL/min — ABNORMAL LOW (ref >90)
GFR calc non Af Amer: 69 mL/min — ABNORMAL LOW (ref >90)
Glucose, Bld: 87 mg/dL (ref 70–99)
Potassium: 4.2 mEq/L (ref 3.5–5.1)
Sodium: 140 mEq/L (ref 135–145)

## 2011-10-05 LAB — RAPID URINE DRUG SCREEN, HOSP PERFORMED
Amphetamines: NOT DETECTED
Barbiturates: NOT DETECTED
Benzodiazepines: NOT DETECTED
Cocaine: NOT DETECTED
Opiates: NOT DETECTED
Tetrahydrocannabinol: NOT DETECTED

## 2011-10-05 LAB — CBC
HCT: 44.7 % (ref 39.0–52.0)
Hemoglobin: 15 g/dL (ref 13.0–17.0)
MCH: 29.9 pg (ref 26.0–34.0)
MCHC: 33.6 g/dL (ref 30.0–36.0)
MCV: 89 fL (ref 78.0–100.0)
Platelets: 186 10*3/uL (ref 150–400)
RBC: 5.02 MIL/uL (ref 4.22–5.81)
RDW: 14.6 % (ref 11.5–15.5)
WBC: 5.7 10*3/uL (ref 4.0–10.5)

## 2011-10-05 LAB — ETHANOL: Alcohol, Ethyl (B): <11 mg/dL (ref 0–11)

## 2011-10-05 MED ORDER — ZOLPIDEM TARTRATE 5 MG PO TABS
5.0000 mg | ORAL_TABLET | Freq: Every evening | ORAL | Status: DC | PRN
  Administered 2011-10-05: 5 mg via ORAL
  Filled 2011-10-05: qty 1

## 2011-10-05 MED ORDER — BUPROPION HCL ER (XL) 150 MG PO TB24
150.0000 mg | ORAL_TABLET | Freq: Every day | ORAL | Status: DC
  Filled 2011-10-05: qty 1

## 2011-10-05 MED ORDER — ALUM & MAG HYDROXIDE-SIMETH 200-200-20 MG/5ML PO SUSP: 30.0000 mL | ORAL | Status: DC | PRN

## 2011-10-05 MED ORDER — RISPERIDONE 2 MG PO TABS
2.0000 mg | ORAL_TABLET | Freq: Every day | ORAL | Status: DC
  Administered 2011-10-05: 2 mg via ORAL
  Filled 2011-10-05: qty 1

## 2011-10-05 MED ORDER — ONDANSETRON HCL 4 MG PO TABS: 4.0000 mg | ORAL_TABLET | Freq: Three times a day (TID) | ORAL | Status: DC | PRN

## 2011-10-05 MED ORDER — IBUPROFEN 200 MG PO TABS: 600.0000 mg | ORAL_TABLET | Freq: Three times a day (TID) | ORAL | Status: DC | PRN

## 2011-10-05 MED ORDER — ACETAMINOPHEN 325 MG PO TABS: 650.0000 mg | ORAL_TABLET | ORAL | Status: DC | PRN

## 2011-10-05 MED ORDER — HYDROXYZINE PAMOATE 50 MG PO CAPS
50.0000 mg | ORAL_CAPSULE | Freq: Every day | ORAL | Status: DC
  Filled 2011-10-05 (×3): qty 1

## 2011-10-05 MED ORDER — AMANTADINE HCL 100 MG PO CAPS
100.0000 mg | ORAL_CAPSULE | Freq: Two times a day (BID) | ORAL | Status: DC
  Filled 2011-10-05 (×4): qty 1

## 2011-10-05 MED ORDER — FINGOLIMOD HCL 0.5 MG PO CAPS: 0.5000 | ORAL_CAPSULE | Freq: Every day | ORAL | Status: DC

## 2011-10-05 MED ORDER — CITALOPRAM HYDROBROMIDE 40 MG PO TABS
40.0000 mg | ORAL_TABLET | Freq: Every day | ORAL | Status: DC
  Filled 2011-10-05: qty 1

## 2011-10-05 MED ORDER — NICOTINE 21 MG/24HR TD PT24
21.0000 mg | MEDICATED_PATCH | Freq: Every day | TRANSDERMAL | Status: DC
  Filled 2011-10-05: qty 1

## 2011-10-05 NOTE — ED Notes (Signed)
1 bag of belongings located under nurse's station beside room. --- RN Patina witnessed bag location.

## 2011-10-05 NOTE — ED Notes (Signed)
Call caregivers once pt. is ready for d/c: Annita Brod) [phone] or Leotis Shames) [phone]

## 2011-10-05 NOTE — ED Notes (Signed)
Report called to Vernicia,RN in [place].

## 2011-10-05 NOTE — ED Notes (Signed)
Sarah-AC notified that patient is suicidal and needs a Comptroller.

## 2011-10-05 NOTE — BH Assessment (Addendum)
Assessment Note   [name] is a 56 y.o. male who presents to [facility] voluntarily endorsing SI with a plan to overdose on tylenol. Pt states he has a long history of depression, stating he has had depressive symptoms on and off since he was a child. He states currently he is having difficulty sleeping, fatigue, and anhedonia. He also states that he is  "sick and tired of everything." He reports no recent stressors.   He lives in [facility], who is supportive of him and will accept him back. Pt states he is taking 40mg  of Celxia and is compliant with medication. He states has received inpatient treatment 15 times since 1992. Most recent inpatient stay at [facility] was in January 2013. Pt states inpatient is effective due to the "group support." Pt states he has attempted suicide several times in his life but is unsure of the exact number of times.            He denies HI, [facility], and SA. Pt is requesting inpatient treatment at this time to alleviate depressive symptoms.    Axis I: Major Depression, Recurrent severe Axis II: Deferred Axis III:  Past Medical History  Diagnosis Date  . Mental disorder   . Depression   . Neuromuscular disorder   . Multiple sclerosis, relapsing-remitting    Axis IV: housing problems, other psychosocial or environmental problems, problems related to social environment and problems with access to health care services Axis V: 31-40 impairment in reality testing  Past Medical History:  Past Medical History  Diagnosis Date  . Mental disorder   . Depression   . Neuromuscular disorder   . Multiple sclerosis, relapsing-remitting     Past Surgical History  Procedure Date  . Tonsillectomy     Family History: History reviewed. No pertinent family history.  Social History:  reports that he has been smoking Cigarettes.  He has a 18 pack-year smoking history. He has never used smokeless tobacco. He reports that he does not drink alcohol or use illicit  drugs.  Additional Social History:  Alcohol / Drug Use History of alcohol / drug use?: No history of alcohol / drug abuse  CIWA: CIWA-Ar BP: 120/87 mmHg Pulse Rate: 63  COWS:    Allergies: No Known Allergies  Home Medications:  (Not in a hospital admission)  OB/GYN Status:  No LMP for male patient.  General Assessment Data Location of Assessment: [facility] ED Living Arrangements: Other (Comment) ([facility]) Can pt return to current living arrangement?: Yes Admission Status: Voluntary Is patient capable of signing voluntary admission?: Yes Transfer from: Acute Hospital Referral Source: MD  Education Status Is patient currently in school?: No Highest grade of school patient has completed: GED  Risk to self Suicidal Ideation: Yes-Currently Present Suicidal Intent: Yes-Currently Present Is patient at risk for suicide?: Yes Suicidal Plan?: Yes-Currently Present Specify Current Suicidal Plan: overdose Access to Means: No What has been your use of drugs/alcohol within the last 12 months?: denies Previous Attempts/Gestures: Yes How many times?:  (several) Other Self Harm Risks: none Triggers for Past Attempts: Unpredictable Intentional Self Injurious Behavior: None Family Suicide History: No Recent stressful life event(s):  (none noted) Persecutory voices/beliefs?: No Depression: Yes Depression Symptoms: Despondent;Isolating;Loss of interest in usual pleasures Substance abuse history and/or treatment for substance abuse?: No Suicide prevention information given to non-admitted patients: Not applicable  Risk to Others Homicidal Ideation: No Thoughts of Harm to Others: No Current Homicidal Intent: No Current Homicidal Plan: No  Access to Homicidal Means: No Identified Victim: none History of harm to others?: No Assessment of Violence: None Noted Violent Behavior Description: cooperative Does patient have access to weapons?: No Criminal Charges Pending?: No Does  patient have a court date: No  Psychosis Hallucinations: None noted Delusions: None noted  Mental Status Report Appear/Hygiene:  (casual) Eye Contact: Good Motor Activity: Unremarkable Speech: Logical/coherent Level of Consciousness: Alert Mood: Depressed Affect: Appropriate to circumstance Anxiety Level: None Thought Processes: Coherent;Relevant Judgement: Impaired Orientation: Person;Place;Time;Situation Obsessive Compulsive Thoughts/Behaviors: None  Cognitive Functioning Concentration: Normal Memory: Recent Intact;Remote Intact IQ: Average Insight: Poor Impulse Control: Fair Appetite: Fair Weight Loss: 0  Weight Gain: 0  Sleep: Decreased Vegetative Symptoms: None  ADLScreening [facility] Assessment Services) Patient's cognitive ability adequate to safely complete daily activities?: Yes Patient able to express need for assistance with ADLs?: Yes Independently performs ADLs?: Yes  Abuse/Neglect [facility]) Physical Abuse: Denies Verbal Abuse: Denies Sexual Abuse: Denies  Prior Inpatient Therapy Prior Inpatient Therapy: Yes Prior Therapy Dates: 05/2011 (states he has had 15 stays since 1992) Prior Therapy Facilty/Provider(s): [facility], [facility], [facility] Reason for Treatment: depression  Prior Outpatient Therapy Prior Outpatient Therapy: Yes Prior Therapy Dates: 2012 [facility] Reason for Treatment: depression and medication management  ADL Screening (condition at time of admission) Patient's cognitive ability adequate to safely complete daily activities?: Yes Patient able to express need for assistance with ADLs?: Yes Independently performs ADLs?: Yes Weakness of Legs: None Weakness of Arms/Hands: None  Home Assistive Devices/Equipment Home Assistive Devices/Equipment: None    Abuse/Neglect Assessment (Assessment to be complete while patient is alone) Physical Abuse: Denies Verbal Abuse: Denies Sexual Abuse: Denies Exploitation of  patient/patient's resources: Denies Self-Neglect: Denies Values / Beliefs Cultural Requests During Hospitalization: None Spiritual Requests During Hospitalization: None   Advance Directives (For Healthcare) Advance Directive: Patient does not have advance directive;Patient would not like information Pre-existing out of facility DNR order (yellow form or pink MOST form): No Nutrition Screen Diet: Regular Unintentional weight loss greater than 10lbs within the last month: No Problems chewing or swallowing foods and/or liquids: No Home Tube Feeding or Total Parenteral Nutrition (TPN): No Patient appears severely malnourished: No  Additional Information 1:1 In Past 12 Months?: No CIRT Risk: No Elopement Risk: No Does patient have medical clearance?: Yes     Disposition:  Disposition Disposition of Patient: Referred to;Inpatient treatment program Type of inpatient treatment program: Adult Pt has been accepted to [facility] by Dr Peterson Lombard to Dr. Dan Humphreys, Rm [id number].EDP has been notified and agrees with plan. Pt also agrees with plan. Support paperwork has signed and faxed to [facility]  On Site Evaluation by:   Reviewed with Physician:     Georgina Quint A 10/05/2011 10:17 PM

## 2011-10-05 NOTE — ED Notes (Signed)
Pt. and security wanded by security

## 2011-10-05 NOTE — ED Notes (Signed)
Patient reports that he went to [facility] this AM and reported that he was suicidal. Patient was told to come to the ED for medical clearance, Patient states he is suicidal and and has a plan to swallow a bottle of sleeping pills. States, "It has been my plan for a while."

## 2011-10-05 NOTE — ED Provider Notes (Signed)
History     CSN: [id number]  Arrival date & time 10/05/11  1101   First MD Initiated Contact with Patient 10/05/11 1157      Chief Complaint  Patient presents with  . Medical Clearance  . Suicidal    (Consider location/radiation/quality/duration/timing/severity/associated sxs/prior treatment) HPI  Patient presents to the ED after being sent here from [facility]. The patient states that he went to monitor this morning and told him that he was suicidal I then instructed him to come to the emergency department for medical clearance. The patient states that his plan is to swallow a bottle of sleeping pills. He does not have a bottle of sleeping pills yet but states that he can get at any drugstore. He says that this is been a splint for a while. He states that he was on Celexa 80 mg which was doing well for him but was cut down to 40 mg because of possible cardiac side effects. He states that nothing new has happened that would have caused his worsening depression. Denies having any medical concerns at this time. NAD and VSS. Denies any ingestion.  Past Medical History  Diagnosis Date  . Mental disorder   . Depression   . Neuromuscular disorder   . Multiple sclerosis, relapsing-remitting     Past Surgical History  Procedure Date  . Tonsillectomy     History reviewed. No pertinent family history.  History  Substance Use Topics  . Smoking status: Current Everyday Smoker -- 0.5 packs/day for 36 years    Types: Cigarettes  . Smokeless tobacco: Never Used  . Alcohol Use: No      Review of Systems   HEENT: denies blurry vision or change in hearing PULMONARY: Denies difficulty breathing and SOB CARDIAC: denies chest pain or heart palpitations MUSCULOSKELETAL:  denies being unable to ambulate ABDOMEN AL: denies abdominal pain GU: denies loss of bowel or urinary control NEURO: denies numbness and tingling in extremities    Allergies  Review of patient's allergies indicates  no known allergies.  Home Medications   Current Outpatient Rx  Name Route Sig Dispense Refill  . AMANTADINE HCL 100 MG PO CAPS Oral Take 100 mg by mouth 2 (two) times daily.    . BUPROPION HCL ER (XL) 150 MG PO TB24 Oral Take 1 tablet (150 mg total) by mouth daily. For depression 30 tablet 0  . CITALOPRAM HYDROBROMIDE 40 MG PO TABS Oral Take 40 mg by mouth daily.    [name] DOXYLAMINE SUCCINATE (SLEEP) 25 MG PO TABS Oral Take 25 mg by mouth at bedtime as needed.    [name] FINGOLIMOD HCL 0.5 MG PO CAPS Oral Take 0.5 capsules by mouth daily.    [name] HYDROXYZINE PAMOATE 50 MG PO CAPS Oral Take 50 mg by mouth at bedtime.    [name] RISPERIDONE 2 MG PO TABS Oral Take 2 mg by mouth at bedtime.      There were no vitals taken for this visit.  Physical Exam  Nursing note and vitals reviewed. Constitutional: He is oriented to person, place, and time. He appears well-developed and well-nourished. No distress.  HENT:  Head: Normocephalic and atraumatic. No trismus in the jaw.  Nose: Nose normal.  Mouth/Throat: Mucous membranes are normal.  Eyes: Conjunctivae are normal. Pupils are equal, round, and reactive to light.  Neck: Trachea normal and normal range of motion.  Cardiovascular: Normal rate and regular rhythm.   Pulmonary/Chest: Effort normal and breath sounds normal.  Abdominal: Soft.  Musculoskeletal: Normal  range of motion.  Neurological: He is oriented to person, place, and time.  Skin: Skin is warm and dry. He is not diaphoretic.  Psychiatric: He exhibits a depressed mood. He expresses suicidal ideation. He expresses no homicidal ideation. He expresses suicidal plans. He expresses no homicidal plans.    ED Course  Procedures (including critical care time)   Labs Reviewed  URINALYSIS, ROUTINE W REFLEX MICROSCOPIC  CBC  URINE RAPID DRUG SCREEN (HOSP PERFORMED)  BASIC METABOLIC PANEL  ETHANOL   No results found.   1. Suicidal ideation   2. Depression       MDM  ACT to consult on  patient        Dorthula Matas, [place] 10/05/11 1233

## 2011-10-05 NOTE — ED Notes (Signed)
Pt. Vitals were taken at 12:10 but were not documented.

## 2011-10-06 ENCOUNTER — Inpatient Hospital Stay (HOSPITAL_COMMUNITY)
Admission: AD | Admit: 2011-10-06 | Discharge: 2011-10-11 | DRG: 885 | Disposition: A | Discharge status: Home / Self Care | Payer: 59 | Source: Other Acute Inpatient Hospital | Attending: Psychiatry | Admitting: Psychiatry

## 2011-10-06 DIAGNOSIS — R45851 Suicidal ideations: Secondary | ICD-10-CM

## 2011-10-06 DIAGNOSIS — Z79899 Other long term (current) drug therapy: Secondary | ICD-10-CM

## 2011-10-06 DIAGNOSIS — G35 Multiple sclerosis: Secondary | ICD-10-CM | POA: Diagnosis present

## 2011-10-06 DIAGNOSIS — F609 Personality disorder, unspecified: Secondary | ICD-10-CM

## 2011-10-06 DIAGNOSIS — F332 Major depressive disorder, recurrent severe without psychotic features: Principal | ICD-10-CM | POA: Diagnosis present

## 2011-10-06 DIAGNOSIS — F341 Dysthymic disorder: Secondary | ICD-10-CM

## 2011-10-06 DIAGNOSIS — F32A Depression, unspecified: Secondary | ICD-10-CM

## 2011-10-06 DIAGNOSIS — F17200 Nicotine dependence, unspecified, uncomplicated: Secondary | ICD-10-CM | POA: Diagnosis present

## 2011-10-06 DIAGNOSIS — F329 Major depressive disorder, single episode, unspecified: Secondary | ICD-10-CM

## 2011-10-06 MED ORDER — ALUM & MAG HYDROXIDE-SIMETH 200-200-20 MG/5ML PO SUSP: 30.0000 mL | ORAL | Status: DC | PRN

## 2011-10-06 MED ORDER — ARIPIPRAZOLE 2 MG PO TABS
2.0000 mg | ORAL_TABLET | Freq: Every day | ORAL | Status: DC
  Administered 2011-10-06 – 2011-10-09 (×4): 2 mg via ORAL
  Filled 2011-10-06 (×5): qty 1

## 2011-10-06 MED ORDER — RISPERIDONE 2 MG PO TABS
2.0000 mg | ORAL_TABLET | Freq: Every day | ORAL | Status: DC
  Administered 2011-10-06 – 2011-10-08 (×3): 2 mg via ORAL
  Filled 2011-10-06 (×5): qty 1

## 2011-10-06 MED ORDER — BUPROPION HCL ER (XL) 150 MG PO TB24
150.0000 mg | ORAL_TABLET | Freq: Every day | ORAL | Status: DC
  Administered 2011-10-06 – 2011-10-11 (×6): 150 mg via ORAL
  Filled 2011-10-06 (×8): qty 1

## 2011-10-06 MED ORDER — AMANTADINE HCL 100 MG PO CAPS
100.0000 mg | ORAL_CAPSULE | Freq: Two times a day (BID) | ORAL | Status: DC
  Administered 2011-10-06 – 2011-10-11 (×11): 100 mg via ORAL
  Filled 2011-10-06 (×16): qty 1

## 2011-10-06 MED ORDER — ACETAMINOPHEN 325 MG PO TABS: 650.0000 mg | ORAL_TABLET | Freq: Four times a day (QID) | ORAL | Status: DC | PRN

## 2011-10-06 MED ORDER — MAGNESIUM HYDROXIDE 400 MG/5ML PO SUSP: 30.0000 mL | Freq: Every day | ORAL | Status: DC | PRN

## 2011-10-06 MED ORDER — CITALOPRAM HYDROBROMIDE 40 MG PO TABS
40.0000 mg | ORAL_TABLET | Freq: Every day | ORAL | Status: DC
  Administered 2011-10-06 – 2011-10-11 (×6): 40 mg via ORAL
  Filled 2011-10-06 (×8): qty 1

## 2011-10-06 NOTE — Progress Notes (Signed)
D). Pt attended the groups and partisipates freely. States that he feels depressed and hopeless rating the depression at a 5 and his hopelessness at a 10. Affect is flat and mood depressed. In between groups Pt rests on his bed. EKG was done and given to the PA. Admits to feelings of SI on and off today. Denies HI A). Pt given support and encouraged to go to his groups. Verbal contract made with the Pt for his safety.  D) has attended the groups, affect is flat, mood depressed. Continues to feel hopeless, helpless and has thoughts of SI on and off throughout the day.
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
## 2011-10-06 NOTE — Progress Notes (Signed)
Call report over to [facility] to Nurse Joann. Pt has room available 507 Bed 1.  Call out to  Security for escort to [facility].

## 2011-10-06 NOTE — Progress Notes (Signed)
[facility] Group Notes:  (Counselor/Nursing/MHT/Case Management/Adjunct)  10/06/2011 5:28 PM  Type of Therapy:  Group Therapy  Participation Level:  Active  Participation Quality:  Appropriate and Attentive  Affect:  Appropriate  Cognitive:  Appropriate  Insight:  Good  Engagement in Group:  Good  Engagement in Therapy:  Good  Modes of Intervention:  Clarification, Education, Socialization and Support  Summary of Progress/Problems: Pt. Participated in counseling group on self sabotaging behaviors and identified what their behavior was and how they could enable themselves in a positive way. The  Pt. got up and left group before he could share with the group. Pt. did not return to group.  [name] [place] 10/06/2011, 5:28 PM

## 2011-10-06 NOTE — H&P (Signed)
Psychiatric Admission Assessment Adult  Patient Identification:  [name] Date of Evaluation:  10/06/2011 56yo DWM CC: increasing depression with SI History of Present Illness: Resident of [facility] since January 2011. He has an ACT team receives psychotherapeutic rehab daily and is hoping to get the new drug for MS. His MS was diagnosed in 2000. I know this patient from prior admissions here and he also recently came to the [place].  He is NOT [place] and receives many more treatments and supportwhere he is using Medicare/Medicaid.  He had come to [place] looking for the new MS drug that his outside Neurologist was going to try to get him on once it became available.  He has failed a number of MS treatments. He has threatened suicide to be admitted many times.    Past Psychiatric History: [facility] Guest -had ECT  Many admissions to [facility] last being June 5-12, 2012   Substance Abuse History:None   Social History: Has a GED went to 11 th grade. Married 14.5 years before wife divorced Has a son now age 72.     reports that he has been smoking Cigarettes.  He has a 18 pack-year smoking history. He has never used smokeless tobacco. He reports that he does not drink alcohol or use illicit drugs.  Family Psych History: Denies  Past Medical History:     Past Medical History  Diagnosis Date  . Mental disorder   . Depression   . Neuromuscular disorder   . Multiple sclerosis, relapsing-remitting        Past Surgical History  Procedure Date  . Tonsillectomy     Allergies: No Known Allergies  Current Medications:  Prior to Admission medications   Medication Sig Start Date End Date Taking? Authorizing Provider  amantadine (SYMMETREL) 100 MG capsule Take 100 mg by mouth 2 (two) times daily.    Historical Provider, MD  buPROPion (WELLBUTRIN XL) 150 MG 24 hr tablet Take 1 tablet (150 mg total) by mouth daily. For depression 05/11/11 05/10/12  Mike Craze, MD  citalopram (CELEXA) 40 MG tablet Take 40  mg by mouth daily.    Historical Provider, MD  diclofenac (VOLTAREN) 50 MG EC tablet Take 50 mg by mouth 2 (two) times daily.    Historical Provider, MD  Fingolimod HCl (GILENYA) 0.5 MG CAPS Take 0.5 capsules by mouth daily.    Historical Provider, MD  hydrOXYzine (VISTARIL) 50 MG capsule Take 50 mg by mouth at bedtime.    Historical Provider, MD  risperiDONE (RISPERDAL) 2 MG tablet Take 2 mg by mouth at bedtime.    Historical Provider, MD    Mental Status Examination/Evaluation: Objective:  Appearance: Well Groomed  Psychomotor Activity:  Decreased due to MS walks with a cane   Eye Contact: Good  Speech:  Normal Rate  Volume:  Normal  Mood: depressed-probably because of Father's Day   Affect:  Depressed  Thought Process:somewhat clear rational goal oriented -wants to feel better     Orientation:  Full  Thought Content:  No AVH/psychosis   Suicidal Thoughts:  Yes.  without intent/plan  Homicidal Thoughts:  No  Judgement:  Fair  Insight:  Fair    DIAGNOSIS:    AXIS I Depression from chronic illness MS  AXIS II Personality Disorder NOS  AXIS III See medical history.  AXIS IV Probably misses his son -Father's Day   AXIS V 51-60 moderate symptoms     Treatment Plan Summary: Admit for safety & stabilization Check  with his ACT team regarding current plan. Adjust meds as indicated. Agree with H&P from [facility].

## 2011-10-06 NOTE — Tx Team (Signed)
Initial Interdisciplinary Treatment Plan  PATIENT STRENGTHS: (choose at least two) Average or above average intelligence Financial means General fund of knowledge  PATIENT STRESSORS: Health problems   PROBLEM LIST: Problem List/Patient Goals Date to be addressed Date deferred Reason deferred Estimated date of resolution  Depressed with suicidal ideation 10/06/11                                                      DISCHARGE CRITERIA:  Ability to meet basic life and health needs Improved stabilization in mood, thinking, and/or behavior Reduction of life-threatening or endangering symptoms to within safe limits Verbal commitment to aftercare and medication compliance  PRELIMINARY DISCHARGE PLAN: Outpatient therapy Return to previous living arrangement  PATIENT/FAMIILY INVOLVEMENT: This treatment plan has been presented to and reviewed with the patient, Alexander, [name] 10/06/2011, 5:26 AM

## 2011-10-06 NOTE — H&P (Signed)
  Pt was seen by me today and I agree with the key elements documented in H&P.  

## 2011-10-06 NOTE — Progress Notes (Signed)
[facility] Group Notes:  (Counselor/Nursing/MHT/Case Management/Adjunct)  10/06/2011 10:08 AM  Type of Therapy:  After Care Planning  Group  Pt. participated in after care planning group and was given the [facility] suicide prevention information and crisis hot line numbers to call in case of emergency. Pt. agreed to [place] the numbers if needed.  Pt. Spoke about coming to [facility] [place] for SI thoughts and depression. Pt. came after overdosing. Pt. Was taken to [facility]  then brought to [facility]  After being at the ER hospital.  Lamar Blinks [facility] 10/06/2011, 10:08 AM
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
## 2011-10-06 NOTE — ED Provider Notes (Signed)
Medical screening examination/treatment/procedure(s) were performed by non-physician practitioner and as supervising physician I was immediately available for consultation/collaboration.  Payton Prinsen R. Deyra Perdomo, MD 10/06/11 0704 

## 2011-10-07 DIAGNOSIS — F341 Dysthymic disorder: Secondary | ICD-10-CM

## 2011-10-07 NOTE — Progress Notes (Signed)
[facility] Group Notes:  (Counselor/Nursing/MHT/Case Management/Adjunct)  10/07/2011 11:47 AM  Type of Therapy:  After care Planning group  Pt. participated in after care planning group and pt. Was given [facility] Suicide Prevention pamphlet along with crisis hotline numbers. Pt. agreed to use them if needed. Pt. Was given information on free support groups and information about the [facility] located in [place], [place]. The pt. Spoke about his energy level being a 3 or 4. Pt. denied SI/Hi today. Pt. Was called out by Pa and left group and did not return.    [name] 10/07/2011, 11:47 AM

## 2011-10-07 NOTE — Progress Notes (Signed)
D) Pt has been attending the groups and interacts with his peers appropriately. Rates his depression at a 7 and his hopelessness at a 10. Admits to SI, but denies HI. A) Given support and reassurance. Verbal contract obtained from Pt. Encouraged Pt to go to his groups and stay in the dayroom instead of sleeping. R) Pt remains safe.

## 2011-10-07 NOTE — Progress Notes (Signed)
[facility] Adult [facility] Family/Significant Other Suicide Prevention Education  Suicide Prevention Education:  Patient Refusal for Family/Significant Other Suicide Prevention Education: The patient [name] has refused to provide written consent for family/significant other to be provided Family/Significant Other Suicide Prevention Education during admission and/or prior to discharge.  Physician notified.  Pt. accepted information on suicide prevention, warning signs to look for with suicide and crisis line numbers to use. The pt. agreed to call crisis line numbers if having warning signs or having thoughts of suicide.   Pt stated that he has "noone" to contact but agreed to let staff know if this changes.  [facility] 10/07/2011, 8:38 AM

## 2011-10-07 NOTE — Progress Notes (Signed)
  [name] is a 56 y.o. male [id number] Sep 11, 1955  10/06/2011 Principal Problem:  *Suicidal ideation Active Problems:  Depression  Dysthymia   Mental Status: Mood is brighter and he is more active in [place] today . Not actively suicidal feels safe here but is "tired of dealing with everything."   Subjective/Objective: Reports that [name] stopped his participation with [employer] of [facility] a month or so ago. Since then he has become bored. The groups at the day program had finally "started to get through his thick head" Is on th elist to start the new MS drug ?[employer]?[name] His Neurologist Dr.Richard Epimenio Foot is trying to work out the co-pay issues with the med like the last .    Filed Vitals:   10/07/11 0701  BP: 96/64  Pulse: 90  Temp:   Resp:     Lab Results:   BMET    Component Value Date/Time   NA 140 10/05/2011 1145   K 4.2 10/05/2011 1145   CL 105 10/05/2011 1145   CO2 26 10/05/2011 1145   GLUCOSE 87 10/05/2011 1145   BUN 17 10/05/2011 1145   CREATININE 1.16 10/05/2011 1145   CALCIUM 9.1 10/05/2011 1145   GFRNONAA 69* 10/05/2011 1145   GFRAA 80* 10/05/2011 1145    Medications:  Scheduled:     . amantadine  100 mg Oral BID  . ARIPiprazole  2 mg Oral Daily  . buPROPion  150 mg Oral Daily  . citalopram  40 mg Oral Daily  . risperiDONE  2 mg Oral QHS     PRN Meds acetaminophen, alum & mag hydroxide-simeth, magnesium hydroxide Plan: case manger needs to verify that pt is not in the donut hole- he is very anxious that he won't be able to continue the Abilify due to co pay issues. Would also like ot continue with [facility] if possible.   [name],[name]. 10/07/2011

## 2011-10-07 NOTE — Progress Notes (Signed)
[facility] Group Notes:  (Counselor/Nursing/MHT/Case Management/Adjunct)  10/07/2011 4:42 PM  Type of Therapy:  Group Therapy  Participation Level:  Active  Participation Quality:  Appropriate and Attentive  Affect:  Appropriate  Cognitive:  Appropriate  Insight:  Good  Engagement in Group:  Good  Engagement in Therapy:  Good  Modes of Intervention:  Clarification, Education, Socialization and Support  Summary of Progress/Problems: Pt. participated in group discussion on support and what it means to them and how to distinguish healthy and unhealthy supports. Each pt identified at least one support in their life and how that person/group supports them. Pt. Stated support meant to him someone who would be there to catch your back as you fall. Pt. stated that the person who runs the assisted living facility where he lives is his support.  [name] [place] 10/07/2011, 4:42 PM

## 2011-10-07 NOTE — [facility] Counselor (Signed)
Adult Comprehensive Assessment  Patient ID: ISAIC SYLER, male   DOB: Mar 22, 1956, 56 y.o.   MRN: [id number]  Information Source: Information source: Patient  Current Stressors:  Educational / Learning stressors: NA Employment / Job issues: on disablity Family Relationships: not much Nurse, learning disability / Lack of resources (include bankruptcy): liminted income Housing / Lack of housing: in [place] reports "all we do is sit and watch TV" Physical health (include injuries & life threatening diseases): MS and lots of pain Social relationships: few friends Substance abuse: denies abuse Bereavement / Loss: step father last yr, mother in 2000  Living/Environment/Situation:  Living Arrangements: Other (Comment) Living conditions (as described by patient or guardian): [facility] How long has patient lived in current situation?: 2 1/2 years What is atmosphere in current home: Comfortable  Family History:  Marital status: Divorced Divorced, when?: july of 2012 What types of issues is patient dealing with in the relationship?: "ok we are civil" Additional relationship information: NA Does patient have children?: Yes How many children?: 1  (106 yr old son) How is patient's relationship with their children?: NA  Childhood History:  By whom was/is the patient raised?: Mother/father and step-parent Additional childhood history information: father past in 40 not postive realiosnhips he left and had no contact after divorce Description of patient's relationship with caregiver when they were a child: "good" with mother Patient's description of current relationship with people who raised him/her: NA Does patient have siblings?: Yes Number of Siblings: 2  (1 sister that past and 1/2 brother) Description of patient's current relationship with siblings: no contact recently Did patient suffer any verbal/emotional/physical/sexual abuse as a child?: No Did patient suffer from severe childhood  neglect?: No Has patient ever been sexually abused/assaulted/raped as an adolescent or adult?: No Was the patient ever a victim of a crime or a disaster?: No Witnessed domestic violence?: No Has patient been effected by domestic violence as an adult?: No  Education:  Highest grade of school patient has completed: GED Currently a Consulting civil engineer?: No Learning disability?: No  Employment/Work Situation:   Employment situation: On disability Why is patient on disability: on for 10 yrs, for MS How long has patient been on disability: 10 years much trouble walking Patient's job has been impacted by current illness: Yes Describe how patient's job has been implacted: can't work What is the longest time patient has a held a job?: NA Where was the patient employed at that time?: NA Has patient ever been in the [employer]?: Yes (Describe in comment) (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) Has patient ever served in combat?: No  Financial Resources:   Financial resources: Insurance claims handler Does patient have a Lawyer or guardian?: No  Alcohol/Substance Abuse:   What has been your use of drugs/alcohol within the last 12 months?: "used to drink a lot" dont have a problem with it If attempted suicide, did drugs/alcohol play a role in this?: No Alcohol/Substance Abuse Treatment Hx: Denies past history If yes, describe treatment: NA Has alcohol/substance abuse ever caused legal problems?: No  Social Support System:   Forensic psychologist System: Poor Describe Community Support System: "like being alone" Type of faith/religion: NA How does patient's faith help to cope with current illness?: dont attend  Leisure/Recreation:   Leisure and Hobbies: watch old [employer] TV shows  Strengths/Needs:   What things does the patient do well?: "cant do much" In what areas does patient struggle / problems for patient: depression  Discharge Plan:   Does  patient have access to transportation?: Yes Will patient be  returning to same living situation after discharge?: Yes Currently receiving community mental health services: Yes (From Whom) (past ACT team) If no, would patient like referral for services when discharged?: Yes (What county?) Medical sales representative) Does patient have financial barriers related to discharge medications?: No (Get through the [place])  Summary/Recommendations:   Summary and Recommendations (to be completed by the evaluator): Pt reports feeling very "hopeless". He states that he is unsure what we can do to help him and that he feels he is "better off alone". Pt struggled to find something positive to look forward to. Pt used to receive services from a ACT team and would like to do this again. Recommendations include crisis stabilization, case management, medication management, psycho-education groups to teach coping skills and group therapy.   Chrissi Crow. 10/07/2011

## 2011-10-07 NOTE — Progress Notes (Signed)
Patient ID: [name], male   DOB: 1956-02-19, 56 y.o.   MRN: [id number] The patient has a depressed mood and flat affect. He attended the evening wrap up group with minimal participation. Described his day as being boring. Denied any suicidal ideation. Compliant with medication. Will continue to monitor.

## 2011-10-08 DIAGNOSIS — F332 Major depressive disorder, recurrent severe without psychotic features: Principal | ICD-10-CM

## 2011-10-08 NOTE — Tx Team (Signed)
Interdisciplinary Treatment Plan Update (Adult)  Date:  10/08/2011  Time Reviewed:  11:00 AM   Progress in Treatment: Attending groups:   Yes   Participating in groups:  Yes Taking medication as prescribed:  Yes Tolerating medication:  Yes Family/Significant othe contact made:  Patient understands diagnosis:  Yes Discussing patient identified problems/goals with staff: Yes Medical problems stabilized or resolved: Yes Denies suicidal/homicidal ideation:Yes Issues/concerns per patient self-inventory:  Other:  New problem(s) identified:  Reason for Continuation of Hospitalization: Medication stabilization Hopelessness Helplessness  Interventions implemented related to continuation of hospitalization:  Medication Management; safety checks q 15 mins  Additional comments:  Estimated length of stay: 2-3 days  Discharge Plan: Return to [facility] with follow up at [facility] goal(s):  Review of initial/current patient goals per problem list:    1.  Goal(s): Eliminate SI/other thoughts of self harm   Met:  Yes  Target date: d/c  As evidenced by: Patient will no longer endorse SI/other thought self harm    2.  Goal (s): Reduce depression/anxiety (currnetly rated at 2/3 and zero   Met:  No  Target date: d/c  As evidenced by: Patient currently rating symptoms at four or below    3.  Goal(s): .stabilize on meds   Met:  No  Target date: d/c  As evidenced by: Patient will report being stable on medications - symptoms have decreased    4.  Goal(s): Refer for outpatient follow up   Met:  No  Target date: d/c  As evidenced by:  Outpatient appointment will be scheduled  Attendees: Patient:     Nursing:    6/17/201311:03 AM  Physician:  Orson Aloe, MD 10/08/2011 11:00 AM   Nursing:   Alease Frame, RN 10/08/2011 11:00 AM   CaseManager:  Juline Patch, LCSW 10/08/2011 11:00 AM   Counselor:  Angus Palms, LCSW 10/08/2011 11:00 AM     Other:  Reyes Ivan, LCSWA

## 2011-10-08 NOTE — Tx Team (Signed)
Interdisciplinary Treatment Plan Update (Adult)  Date:  10/08/2011  Time Reviewed:  10:47 AM   Progress in Treatment: Attending groups: Yes Participating in groups:  Yes Taking medication as prescribed:  Yes Tolerating medication: Yes Family/Significant othe contact made:  No,[name] will not consent for contact to be made  Patient understands diagnosis: Yes Discussing patient identified problems/goals with staff:  Yes Medical problems stabilized or resolved: Yes Denies suicidal/homicidal ideation: No, reports he wishes people would just let him die - passively suicidal  Issues/concerns per patient self-inventory:  No  Other:  New problem(s) identified: None  Reason for Continuation of Hospitalization: Medication stabilization Suicidal ideation  Interventions implemented related to continuation of hospitalization:  Medication stabilization, safety checks q 15 mins, group attendance  Additional comments:  Estimated length of stay: 3-5 days  Discharge Plan: [name] [facility]  Review of initial/current patient goals per problem list:   1.  Goal(s): Decrease rating of depressive symptoms to 4 or less  Met:  Yes  Target date: by discharge  As evidenced by: [name] rates depression at 2-3 today  2.  Goal (s): Decrease rating of anxiety symptoms to 4 or less  Met:  Yes  Target date: by discharge  As evidenced by: [name] rate anxiety at 0 today  3.  Goal(s): Reduce potential for suicide/self-harm  Met:  No  Target date: by discharge  As evidenced by: [name] reports that he has no suicidal intent, but wishes "everyone would just leave [him] alone and let [him] die in peace."  4.  Goal(s): Medication stabilization  Met:  Yes  Target date: by discharge  As evidenced by: [name] reports medications have worked to decrease his symptoms with no intolerable side effects  Attendees: Patient:       Family:     Physician:  Dr Orson Aloe, MD 10/08/2011 10:47 AM  Nursing:   Rodman Key, RN 10/08/2011 10:47 AM  Case Manager:  Juline Patch, LCSW 10/08/2011 10:47 AM  Counselor:  Angus Palms, LCSW 10/08/2011 10:47 AM  Other:  Reyes Ivan, LCSWA 10/08/2011 10:47 AM  Other:  Alease Frame, RN 10/08/2011 10:47 AM  Other:  Corinne Ports, doctoral psych intern 10/08/2011 10:47 AM  Other:      Scribe for Treatment Team:   [name], 10/08/2011 10:47 AM

## 2011-10-08 NOTE — Progress Notes (Signed)
Pt. Appears pleasant and cooperative this am.  D- Sates his appetite is improving and his energy level is normal. He does feel very hopeless with a 10/10. And has been having on and off Si passive but denies he will act.  A-Pt denies he will act on SI thoughts. Good eye contact and pleasant. Pt has been attending groups an dgoing to the cafeteria for meals. Continues to use his cane for ambulation.  R- States the medicatiopns do make him feel better.

## 2011-10-08 NOTE — Progress Notes (Signed)
Patient ID: [name], male   DOB: December 03, 1955, 56 y.o.   MRN: [id number] The patient has a flat affect and depressed mood. He attended the evening wrap up group with minimal participation. Stated he was not actively suicidal but wish that he was dead. Further exploration divulged he was bored and had nothing to do other than watch TV or do puzzles at his group home. Reports he use to go to a support group that he liked, but they stopped picking him up to take him there because he was too high risk due to his multiple hospitalizations. He has no means of transportation and feels isolated.

## 2011-10-08 NOTE — Progress Notes (Signed)
Brief Nutrition Note  Reason: Nutrition Risk for unintentional weight loss > 10 lb over 1 month.  Patient reported a fair appetite. He reported he is eating well. He stated at his group home he eats 2 meals a day because he dislikes the sandwiches provided for lunch. He reported he does not believe he has lost any weight. I have encouraged the patient to eat lunch daily. The patient was without any nutrition related questions. Patient not at nutrition risk at this time.   RD available for nutrition needs.   Iven Finn [facility] [phone]

## 2011-10-08 NOTE — Progress Notes (Signed)
Currently resting quietly in bed with eyes closed. Respirations are even and unlabored. No acute distress or discomfort noted. Safety has been maintained with Q15 minute observations. Will continue current POC. 

## 2011-10-08 NOTE — Progress Notes (Signed)
Pt is pleasant and cooperative. Pt has SI off and on but contracts for safety. Pt does attend groups. Pt was offered support and encouragement. Pt receptive to treatment and safety maintained on the unit.

## 2011-10-08 NOTE — Progress Notes (Signed)
Date: 10/08/2011        Time: 1145       Group Topic/Focus: Patient invited to participate in animal assisted therapy. Pets as a coping skill and responsibility were discussed.   Participation Level: Active  Participation Quality: Appropriate and Attentive  Affect: Appropriate  Cognitive: Appropriate and Oriented   Additional Comments: None    

## 2011-10-08 NOTE — Progress Notes (Signed)
[facility] MD Progress Note  10/08/2011 6:25 PM  Diagnosis:   Axis I: Major Depression, Recurrent severe Axis II: Deferred Axis III:  Past Medical History  Diagnosis Date  . Mental disorder   . Depression   . Neuromuscular disorder   . Multiple sclerosis, relapsing-remitting     ADL's:  Intact  Sleep: Fair  Appetite:  Fair  Suicidal Ideation:  Pt denies any thoughts of suicide today. Homicidal Ideation:  Denies adamantly any homicidal thoughts.  Mental Status Examination/Evaluation: Objective:  Appearance: Casual  Eye Contact::  Good  Speech:  Clear and Coherent  Volume:  Normal  Mood:  Depressed, Hopeless and Worthless  Affect:  Congruent  Thought Process:  Coherent  Orientation:  Full  Thought Content:  WDL  Suicidal Thoughts:  No  Homicidal Thoughts:  No  Memory:  Immediate;   Fair  Judgement:  Fair  Insight:  Fair  Psychomotor Activity:  Normal  Concentration:  Fair  Recall:  Fair  Akathisia:  No  Handed:  Left  AIMS (if indicated):     Assets:  Communication Skills Desire for Improvement  Sleep:  Number of Hours: 6.75    ROS: Neuro: no headaches, ataxia, weakness  GI: no N/V/D/cramps/constipation  MS: no weakness, muscle cramps, aches.   Vital Signs:Blood pressure 112/72, pulse 72, temperature 98 F (36.7 C), temperature source Oral, resp. rate 16, height 5\' 11"  (1.803 m), weight 78.926 kg (174 lb). Current Medications: Current Facility-Administered Medications  Medication Dose Route Frequency Provider Last Rate Last Dose  . acetaminophen (TYLENOL) tablet 650 mg  650 mg Oral Q6H PRN Mickeal Skinner, MD      . alum & mag hydroxide-simeth (MAALOX/MYLANTA) 200-200-20 MG/5ML suspension 30 mL  30 mL Oral Q4H PRN Mickeal Skinner, MD      . amantadine (SYMMETREL) capsule 100 mg  100 mg Oral BID Mickeal Skinner, MD   100 mg at 10/08/11 1711  . ARIPiprazole (ABILIFY) tablet 2 mg  2 mg Oral Daily Mickeal Skinner, MD   2 mg at 10/08/11 0923  . buPROPion (WELLBUTRIN  XL) 24 hr tablet 150 mg  150 mg Oral Daily Mickeal Skinner, MD   150 mg at 10/08/11 0924  . citalopram (CELEXA) tablet 40 mg  40 mg Oral Daily Mickeal Skinner, MD   40 mg at 10/08/11 0924  . magnesium hydroxide (MILK OF MAGNESIA) suspension 30 mL  30 mL Oral Daily PRN Mickeal Skinner, MD      . risperiDONE (RISPERDAL) tablet 2 mg  2 mg Oral QHS Mickeal Skinner, MD   2 mg at 10/07/11 2136    Lab Results: No results found for this or any previous visit (from the past 48 hour(s)).  Physical Findings: AIMS:  , ,  ,  ,    CIWA:    COWS:     Treatment Plan Summary: Daily contact with patient to assess and evaluate symptoms and progress in treatment Medication management  Plan: Continue current antidepressants.  Monitor for positive effect again.  He will seek outpt medications from [place] and should be able to afford Abilify which works well for him.  [name] 10/08/2011, 6:25 PM

## 2011-10-08 NOTE — [facility] Counselor (Addendum)
Group Therapy Note  Date:  10/08/2011 Time:  11:00  Group Topic/Focus:  Wellness  Participation Level:  Minimal  Participation Quality:  Appropriate  Affect:  Depressed  Cognitive:  Appropriate  Insight:  Limited  Engagement in Group:  Limited  Additional Comments:  [name] was quiet but attentive. He reported that he believes sleep is an important part of physical wellness. Also, he reported that he struggles with social wellness because he has poor communication skills. He would like to work on improving his social and emotional wellness.   Huq, Vanessa Barbara. 10/08/2011, 11:56 AM       [facility] Group Notes: (Counselor/Nursing/MHT/Case Management/Adjunct) 10/08/2011   @1 :15pm Rules for Recovery  Type of Therapy:  Group Therapy  Participation Level:  Active  Participation Quality: Attentive, Sharing  Affect:  Blunted  Cognitive:  Appropriate  Insight:  Limited  Engagement in Group: Good  Engagement in Therapy:  Good  Modes of Intervention:  Support and Exploration  Summary of Progress/Problems: [name] explored his rules for recovery. He shared that he needs to remember to stay on his medication even when he feels good, because it is the medication that is helping him to feel that way. He went on to say that meds are only half of the solution, and explored ways that making changes is essential to recovery. [name] talked about his lack of self-worth, and shared that it was chipped away over time and that although he knows it will not come back all at once, he wishes he could see some progress.  [name] 10/08/2011 3:26 PM

## 2011-10-09 MED ORDER — THYROID 30 MG PO TABS
15.0000 mg | ORAL_TABLET | Freq: Every day | ORAL | Status: DC
Start: 2011-10-10 — End: 2011-10-10
  Administered 2011-10-10: 15 mg via ORAL
  Filled 2011-10-09 (×3): qty 1

## 2011-10-09 MED ORDER — ARIPIPRAZOLE 2 MG PO TABS
2.0000 mg | ORAL_TABLET | Freq: Every day | ORAL | Status: DC
  Administered 2011-10-09 – 2011-10-11 (×3): 2 mg via ORAL
  Filled 2011-10-09 (×5): qty 1

## 2011-10-09 NOTE — Progress Notes (Signed)
Pt pleasant and cooperative. Pt attends groups. Pt affect is flat. Pt had no complaints. Pt was offered support and encouragement. Pt receptive to treatment and safety maintained on the unit.

## 2011-10-09 NOTE — Progress Notes (Signed)
[facility] Group Notes: (Counselor/Nursing/MHT/Case Management/Adjunct) 10/09/2011   @ 11:00 Feelings About Diagnosis  Type of Therapy:  Group Therapy  Participation Level:  Minimal  Participation Quality: Sharing, Attentive    Affect:  Flat  Cognitive:  Appropriate  Insight:  Poor  Engagement in Group: Limited  Engagement in Therapy: Minimal   Modes of Intervention:  Support and Exploration  Summary of Progress/Problems: Colleen was mostly quiet in the group, though he spoke up to agree with other group members who took a very nihilistic perspective to life. He stated that he does not see the purpose of life and that we would all be better off if a giant asteroid hit [place] and killed 90% of people. Amol clarified that he does not have thoughts of hurting others, but believes that some people will always be miserable and it would be okay if they just did not wake up one day.   [name] 10/09/2011  1:32 PM

## 2011-10-09 NOTE — Progress Notes (Signed)
Patient presents with a flat affect, rating depression 3/10 and hopelessness at 10/10.  States that he still feels depressed but denies SI at this time.  Contracts for safety.  Going to groups, but is not having much interaction with staff or other patients.  Safety maintained on unit.

## 2011-10-09 NOTE — Progress Notes (Signed)
Grief and Loss Group  Co-facilitated grief and loss group w/ Corliss Marcus, MA, MEd, LPCA, NCC for pt's on [address]. Group focused on types of loss (re: death/dying, relationships, divorce, jobs, mental health, children) and grief reactions (re: depression, anger, feeling cheated, feeling unsettled). Group was open and initially supportive, group had difficulty w/ group member who expressed anger toward others in his own life.  Pt was active and engaged in group. Pt shared that he suffers from depression, stated that his wife told him she could no longer handle him w/ his depression and left next day, states he can only see son on weekends, and has lost considerable use of his legs to M.S. Pt stated that he feels cheated by wife and life in general, states life has not turned out the way he thought it should. Pt was supportive of others and empathized w/ group members.  [name] B MS, LPCA, NCC

## 2011-10-09 NOTE — Progress Notes (Signed)
Currently resting quietly in bed with eyes closed. Respirations are even and unlabored. No acute distress noted. Safety has been maintained with Q15 minute observation. Will continue current POC. 

## 2011-10-09 NOTE — Progress Notes (Signed)
Pt attended discharge planning group and actively participated.  Pt presents with flat affect and depressed mood.  Pt rates depression at a 3-4 today and denies anxiety and SI.  Pt states that he is discouraged because he feels nothing ever helps him.  Pt states that he plans to live one day at a time.  Pt states that he plans to go back to the group home upon d/c.  Pt states that he doesn't have a psychiatrist or therapist but gets his meds from the [place].  No further needs voiced by pt at this time.    Reyes Ivan, LCSWA 10/09/2011  9:51 AM

## 2011-10-09 NOTE — Tx Team (Signed)
Interdisciplinary Treatment Plan Update (Adult)  Date:  10/09/2011  Time Reviewed:  10:30 AM   Progress in Treatment: Attending groups: Yes Participating in groups:  Yes Taking medication as prescribed: Yes Tolerating medication:  Yes Family/Significant other contact made:  Counselor assessing for appropriate contact Patient understands diagnosis:  Yes Discussing patient identified problems/goals with staff:  Yes Medical problems stabilized or resolved:  Yes Denies suicidal/homicidal ideation: Yes Issues/concerns per patient self-inventory:  None identified Other: N/A  New problem(s) identified: None Identified  Reason for Continuation of Hospitalization: Anxiety Depression Medication stabilization Suicidal ideation  Interventions implemented related to continuation of hospitalization: mood stabilization, medication monitoring and adjustment, group therapy and psycho education, safety checks q 15 mins  Additional comments: Dr will test pt's thyroid and start a thyroid replacement  Estimated length of stay: 3-5 days  Discharge Plan: SW will assess for appropriate referrals for follow up.    New goal(s): N/A  Review of initial/current patient goals per problem list:    1.  Goal(s): Reduce depressive symptoms  Met:  No  Target date: by discharge  As evidenced by: Reducing depression from a 10 to a 3 as reported by pt.   2.  Goal (s): Reduce/Eliminate suicidal ideation  Met:  No  Target date: by discharge  As evidenced by: pt reporting no SI.    3.  Goal(s): Reduce anxiety symptoms  Met:  No  Target date: by discharge  As evidenced by: Reduce anxiety from a 10 to a 3 as reported by pt.    Attendees: Patient:  [name] 10/09/2011 10:31 AM   Family:     Physician:  Orson Aloe, MD  10/09/2011  10:30 AM   Nursing:   Izola Price, RN 10/09/2011 10:31 AM   Case Manager:  Reyes Ivan, LCSWA 10/09/2011  10:30 AM   Counselor:  Angus Palms, LCSW 10/09/2011   10:30 AM   Other:  Chinita Greenland, RN 10/09/2011  10:30 AM   Other:  10/09/2011  10:30 AM   Other:     Other:      Scribe for Treatment Team:   Carmina Miller, 10/09/2011 , 10:30 AM

## 2011-10-09 NOTE — Progress Notes (Signed)
[facility] MD Progress Note  10/09/2011 9:27 AM  Diagnosis:   Axis I: Major Depression, Recurrent severe Axis II: Deferred Axis III:  Past Medical History  Diagnosis Date  . Mental disorder   . Depression   . Neuromuscular disorder   . Multiple sclerosis, relapsing-remitting     ADL's:  Intact  Sleep: Fair  Appetite:  Good  Suicidal Ideation:  Pt denies any thoughts of suicide today. Homicidal Ideation:  Denies adamantly any homicidal thoughts.  Mental Status Examination/Evaluation: Objective:  Appearance: Casual  Eye Contact::  Good  Speech:  Clear and Coherent  Volume:  Normal  Mood:  Depressed, Hopeless and Worthless, feels hopeless and helpless about anything ever changing for him.  It is not clear why he is on both Risperdal and Abilify.  He was not discharged on the Abilify when he left in January.  If it is for depression augmentation, will stop the Abilify and go with Armour Thyroid.  Affect:  Congruent  Thought Process:  Coherent  Orientation:  Full  Thought Content:  WDL  Suicidal Thoughts:  No  Homicidal Thoughts:  No  Memory:  Immediate;   Fair  Judgement:  Fair  Insight:  Fair  Psychomotor Activity:  Normal  Concentration:  Fair  Recall:  Fair  Akathisia:  No  Handed:  Left  AIMS (if indicated):     Assets:  Communication Skills Desire for Improvement  Sleep:  Number of Hours: 6    ROS: Neuro: denies headaches, ataxia, weakness  GI: denies N/V/D/cramps/constipation  MS: denies weakness, muscle cramps, aches.   Vital Signs:Blood pressure 87/61, pulse 108, temperature 97.9 F (36.6 C), temperature source Oral, resp. rate 16, height 5\' 11"  (1.803 m), weight 78.926 kg (174 lb). Current Medications: Current Facility-Administered Medications  Medication Dose Route Frequency Provider Last Rate Last Dose  . acetaminophen (TYLENOL) tablet 650 mg  650 mg Oral Q6H PRN Mickeal Skinner, MD      . alum & mag hydroxide-simeth (MAALOX/MYLANTA) 200-200-20 MG/5ML  suspension 30 mL  30 mL Oral Q4H PRN Mickeal Skinner, MD      . amantadine (SYMMETREL) capsule 100 mg  100 mg Oral BID Mickeal Skinner, MD   100 mg at 10/09/11 0754  . ARIPiprazole (ABILIFY) tablet 2 mg  2 mg Oral Daily Mickeal Skinner, MD   2 mg at 10/09/11 0754  . buPROPion (WELLBUTRIN XL) 24 hr tablet 150 mg  150 mg Oral Daily Mickeal Skinner, MD   150 mg at 10/09/11 0754  . citalopram (CELEXA) tablet 40 mg  40 mg Oral Daily Mickeal Skinner, MD   40 mg at 10/09/11 0754  . magnesium hydroxide (MILK OF MAGNESIA) suspension 30 mL  30 mL Oral Daily PRN Mickeal Skinner, MD      . risperiDONE (RISPERDAL) tablet 2 mg  2 mg Oral QHS Mickeal Skinner, MD   2 mg at 10/08/11 2105    Lab Results: No results found for this or any previous visit (from the past 48 hour(s)).  Physical Findings: AIMS:  , ,  ,  ,    CIWA:    COWS:     Treatment Plan Summary: Daily contact with patient to assess and evaluate symptoms and progress in treatment Medication management  Plan: Pt does not know why he was put on Abilify on top of the Risperdal that he is on.  It was started since he left last time in January.  Will instead augment his antidepressant coverage with Armour Thyroid instead.  [name] 10/09/2011, 9:27 AM

## 2011-10-10 MED ORDER — RISPERIDONE 2 MG PO TABS
2.0000 mg | ORAL_TABLET | Freq: Every day | ORAL | Status: DC
  Administered 2011-10-10: 2 mg via ORAL
  Filled 2011-10-10 (×4): qty 1

## 2011-10-10 MED ORDER — CITALOPRAM HYDROBROMIDE 40 MG PO TABS: 40.0000 mg | ORAL_TABLET | Freq: Every day | ORAL | Status: DC

## 2011-10-10 MED ORDER — RISPERIDONE 2 MG PO TABS: 2.0000 mg | ORAL_TABLET | Freq: Every day | ORAL | Status: DC

## 2011-10-10 MED ORDER — DICLOFENAC SODIUM 50 MG PO TBEC: 50.0000 mg | DELAYED_RELEASE_TABLET | Freq: Two times a day (BID) | ORAL | Status: DC

## 2011-10-10 MED ORDER — ARIPIPRAZOLE 2 MG PO TABS: 2.0000 mg | ORAL_TABLET | Freq: Every day | ORAL | Status: DC

## 2011-10-10 MED ORDER — AMANTADINE HCL 100 MG PO CAPS: 100.0000 mg | ORAL_CAPSULE | Freq: Two times a day (BID) | ORAL | Status: DC

## 2011-10-10 NOTE — Progress Notes (Signed)
D: Pt denies SI/HI/AV. Pt is pleasant and cooperative. Pt rates depression at a 3, and Helplessness/hopelessness at a 10.  A: Pt was offered support and encouragement. Pt was given medications. Pt was encourage to attend groups. Pt came to treatment team today and will be leaving and going back to assisted living facility tomorrow.  R: Pt attends groups and interacts well with peers and staff. Pt is taking medication. Pt has no complaints.Pt receptive to treatment and safety maintained on unit.
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
## 2011-10-10 NOTE — Progress Notes (Addendum)
D: Visible in milieu and interacting appropriately with select peers. Appears flat and depressed. Open and pleasant in conversation. States he feels like his mood is much improved and he is eager for D/C in AM. States the plan is to return to ALF where he was residing prior to admission. Denies SI/HI/AVH and contracts for safety.   A: Support and encouragement provided. Safety maintained with Q15 minute observation. Discussed POC and medications for the shift and pt verbalized understanding. Encouraged group attendance and participation. Medications given as ordered.  R: Pt has been attending groups and participating appropriately. Has been compliant with POC and treatment goals. Has remained safe on the unit.

## 2011-10-10 NOTE — Progress Notes (Signed)
[facility] MD Progress Note  10/10/2011 5:03 PM  Diagnosis:   Axis I: Major Depression, Recurrent severe Axis II: Deferred Axis III:  Past Medical History  Diagnosis Date  . Mental disorder   . Depression   . Neuromuscular disorder   . Multiple sclerosis, relapsing-remitting     ADL's:  Intact  Sleep: Poor, off Risperdal. That is why he is on Risperdal.  It works for him getting to and staying asleep.  Appetite:  Good  Suicidal Ideation:  Pt denies any thoughts of suicide today. Homicidal Ideation:  Denies adamantly any homicidal thoughts.  Mental Status Examination/Evaluation: Objective:  Appearance: Casual  Eye Contact::  Good  Speech:  Clear and Coherent  Volume:  Normal  Mood:  Depressed and Hopeless, still feels hopeless and helpless about anything ever changing for him.  Immediately remembered why he was on Risperdal.  It is for sleep.  Will restart.  Affect:  Congruent  Thought Process:  Coherent  Orientation:  Full  Thought Content:  WDL  Suicidal Thoughts:  No  Homicidal Thoughts:  No  Memory:  Immediate;   Fair  Judgement:  Good  Insight:  Fair  Psychomotor Activity:  Normal  Concentration:  Fair  Recall:  Fair  Akathisia:  No  Handed:  Left  AIMS (if indicated):     Assets:  Communication Skills Desire for Improvement  Sleep:  Number of Hours: 5.25    ROS: Neuro: no headaches, ataxia, weakness  GI: no N/V/D/cramps/constipation  MS: no weakness, muscle cramps, aches.   Vital Signs:Blood pressure 108/66, pulse 81, temperature 97.5 F (36.4 C), temperature source Oral, resp. rate 18, height 5\' 11"  (1.803 m), weight 78.926 kg (174 lb). Current Medications: Current Facility-Administered Medications  Medication Dose Route Frequency Provider Last Rate Last Dose  . acetaminophen (TYLENOL) tablet 650 mg  650 mg Oral Q6H PRN Mickeal Skinner, MD      . alum & mag hydroxide-simeth (MAALOX/MYLANTA) 200-200-20 MG/5ML suspension 30 mL  30 mL Oral Q4H PRN Mickeal Skinner, MD      . amantadine (SYMMETREL) capsule 100 mg  100 mg Oral BID Mickeal Skinner, MD   100 mg at 10/10/11 1653  . ARIPiprazole (ABILIFY) tablet 2 mg  2 mg Oral Daily Mike Craze, MD   2 mg at 10/10/11 0748  . buPROPion (WELLBUTRIN XL) 24 hr tablet 150 mg  150 mg Oral Daily Mickeal Skinner, MD   150 mg at 10/10/11 0748  . citalopram (CELEXA) tablet 40 mg  40 mg Oral Daily Mickeal Skinner, MD   40 mg at 10/10/11 0748  . magnesium hydroxide (MILK OF MAGNESIA) suspension 30 mL  30 mL Oral Daily PRN Mickeal Skinner, MD      . risperiDONE (RISPERDAL) tablet 2 mg  2 mg Oral QHS Mike Craze, MD      . DISCONTD: thyroid (ARMOUR) tablet 15 mg  15 mg Oral QAC breakfast Mike Craze, MD   15 mg at 10/10/11 1610    Lab Results: No results found for this or any previous visit (from the past 48 hour(s)).  Physical Findings: AIMS:  , ,  ,  ,    CIWA:    COWS:     Treatment Plan Summary: Daily contact with patient to assess and evaluate symptoms and progress in treatment Medication management  Plan: Abilify helps his mood the best and Risperdal helps him sleep.  Will restart. Seems to be doing pretty well.  Will plan for D/C  tomorrow.  Melton Walls 10/10/2011, 5:03 PM
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
## 2011-10-10 NOTE — Progress Notes (Signed)
10/10/2011         Time: 1415      Group Topic/Focus: The focus of this group is on discussing various styles of communication and communicating assertively using 'I' (feeling) statements.   Participation Level: Minimal  Participation Quality: Appropriate  Affect: Appropriate  Cognitive: Oriented  Additional Comments: Patient present for last ten minutes of group, says he didn't know group was occurring although R.T. Went to his room twice to invite him to group. Patient bright, joking with peers, well engaged while in group.   Khyree Carillo 10/10/2011 3:02 PM

## 2011-10-11 DIAGNOSIS — R45851 Suicidal ideations: Secondary | ICD-10-CM

## 2011-10-11 NOTE — Progress Notes (Signed)
D: Pt about on the unit.  His affect was appropriate although seemed a little sad about leaving.  He denies and SI/HI or any A/V hallucinations.  Patient rates his depression as a 3/10 and hopelessness as a 10/10.  He says he is ready to leave today . A:  Patient offered support and encouragement.  Attending groups.  Taking medications as prescribed.   R:  Pt. Receptive to staff.  Safety maintained on the unit and patient checked on q 15 minutes.

## 2011-10-11 NOTE — Progress Notes (Signed)
[facility] MD Progress Note  10/11/2011 2:14 PM  Diagnosis:   Axis I: Major Depression, Recurrent severe Axis II: Deferred Axis III:  Past Medical History  Diagnosis Date  . Mental disorder   . Depression   . Neuromuscular disorder   . Multiple sclerosis, relapsing-remitting     ADL's:  Intact  Sleep: Good, off Risperdal. That is why Alexander is on Risperdal.  It works for him getting to and staying asleep.  Appetite:  Good  Suicidal Ideation:  Pt denies any thoughts of suicide today. Homicidal Ideation:  Denies adamantly any homicidal thoughts.  Mental Status Examination/Evaluation: Objective:  Appearance: Casual  Eye Contact::  Good  Speech:  Clear and Coherent  Volume:  Normal  Mood:  Dysphoric and Hopeless, still feels hopeless and helpless about anything ever changing for him.  Immediately remembered why Alexander was on Risperdal.  It is for sleep.  Will restart.  Affect:  Congruent  Thought Process:  Coherent  Orientation:  Full  Thought Content:  WDL  Suicidal Thoughts:  No  Homicidal Thoughts:  No  Memory:  Immediate;   Fair  Judgement:  Good  Insight:  Good  Psychomotor Activity:  Normal  Concentration:  Good  Recall:  Good  Akathisia:  No  Handed:  Left  AIMS (if indicated):     Assets:  Communication Skills Desire for Improvement  Sleep:  Number of Hours: 6.5    ROS: Neuro: no headaches, ataxia, weakness  GI: no N/V/D/cramps/constipation  MS: no weakness, muscle cramps, aches.   Vital Signs:Blood pressure 108/66, pulse 81, temperature 97.5 F (36.4 C), temperature source Oral, resp. rate 18, height 5\' 11"  (1.803 m), weight 78.926 kg (174 lb). Current Medications: Current Facility-Administered Medications  Medication Dose Route Frequency Provider Last Rate Last Dose  . acetaminophen (TYLENOL) tablet 650 mg  650 mg Oral Q6H PRN Mickeal Skinner, MD      . alum & mag hydroxide-simeth (MAALOX/MYLANTA) 200-200-20 MG/5ML suspension 30 mL  30 mL Oral Q4H PRN Mickeal Skinner, MD      . amantadine (SYMMETREL) capsule 100 mg  100 mg Oral BID Mickeal Skinner, MD   100 mg at 10/11/11 0807  . ARIPiprazole (ABILIFY) tablet 2 mg  2 mg Oral Daily Mike Craze, MD   2 mg at 10/11/11 1610  . buPROPion (WELLBUTRIN XL) 24 hr tablet 150 mg  150 mg Oral Daily Mickeal Skinner, MD   150 mg at 10/11/11 0807  . citalopram (CELEXA) tablet 40 mg  40 mg Oral Daily Mickeal Skinner, MD   40 mg at 10/11/11 0807  . magnesium hydroxide (MILK OF MAGNESIA) suspension 30 mL  30 mL Oral Daily PRN Mickeal Skinner, MD      . risperiDONE (RISPERDAL) tablet 2 mg  2 mg Oral QHS Mike Craze, MD   2 mg at 10/10/11 2107  . DISCONTD: thyroid (ARMOUR) tablet 15 mg  15 mg Oral QAC breakfast Mike Craze, MD   15 mg at 10/10/11 9604    Lab Results: No results found for this or any previous visit (from the past 48 hour(s)).  Physical Findings: AIMS:  , ,  ,  ,    CIWA:    COWS:     Treatment Plan Summary: Daily contact with patient to assess and evaluate symptoms and progress in treatment Medication management  Plan: [name] Alexander on his Ability and Risperdal. Alexander wants to released to go Alexander to the home where  Alexander stays. It is recommended that Alexander make a better effort to help himself be content by playing more chess. Alexander clearly states that Alexander doesn't like himself and doesn't see much reason to treat himself with respect.  It was pointed out to him that no one else is standing up to to the job and Alexander knows what Alexander needs better than anyone else.  Alexander was unconvinced.  Alexander was informed that Alexander will probably be Alexander.  Keerthi Hazell 10/11/2011, 2:14 PM
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
## 2011-10-11 NOTE — Tx Team (Addendum)
Interdisciplinary Treatment Plan Update (Adult)  Date:  10/11/2011  Time Reviewed:  10:48 AM   Progress in Treatment: Attending groups:   Yes   Participating in groups:  Yes Taking medication as prescribed:  Yes Tolerating medication:  Yes Family/Significant othe contact made:  Patient understands diagnosis:  Yes Discussing patient identified problems/goals with staff: Yes Medical problems stabilized or resolved: Yes Denies suicidal/homicidal ideation:Yes Issues/concerns per patient self-inventory:  Other:  New problem(s) identified:  Reason for Continuation of Hospitalization:  Interventions implemented related to continuation of hospitalization:  Additional comments:  Estimated length of stay:  Discharge home today  Discharge Plan: Return to [facility] with [facility] at [facility]):  Review of initial/current patient goals per problem list:    1.  Goal(s):  Eliminate SI/other thoughts of self harm   Met:  Yes  Target date:  D/c  As evidenced by: Patient no longer endorses SI/other thought self harm    2.  Goal (s):  Reduce hopelessness/helplessness   Met:  No  Target date: d/c  As evidenced by: Deferred due to symptoms being chronic  3.  Goal(s): Reduce depression/anxiety rated at two/three and zero today)   Met:  Yes  Target date: d/c  As evidenced by: Patient currently rating symptoms at four or below    4.  Goal(s): .stabilize on meds   Met:  Yes  Target date: d/c  As evidenced by: Patient will report being stable on medications - symptoms have decreased    Attendees: Patient:   [name] 6/20/201310:52 AM  Nursing: Roswell Miners  10/11/2011 10:52 AM  Physician:  Orson Aloe, MD 10/11/2011 10:48 AM   Nursing:   Gertie Fey, RN 10/11/2011 10:48 AM   CaseManager:  Juline Patch, LCSW 10/11/2011 10:48 AM   Counselor:  Angus Palms, LCSW 10/11/2011 10:48 AM   Other:  Reyes Ivan, LCSWA Other:  Precious Bard, PhD  Candidate     10/11/2011 10:53 AM Clarice Pole, LCAS-A      10/11/2011 10:53 AM

## 2011-10-11 NOTE — Progress Notes (Signed)
Patient verbalizes understanding of discharge instructions, prescriptions and follow up care. All patient belongings returned to patient from [facility] locker #9 including belt, hat shoes, pencil, pencil sharpener, 2 cigarette packs. Patient escorted out by staff, transported by assisted living facility van/staff.

## 2011-10-11 NOTE — [facility] Counselor (Signed)
Psychoeducational Group Note  Date:  10/11/2011 Time:  11am  Group Topic/Focus:  Balance  Participation Level:  Active  Participation Quality:  Appropriate and Drowsy  Affect:  Depressed  Cognitive:  Appropriate  Insight:  Limited  Engagement in Group:  Good  Additional Comments:  Trexton was engaged in [employer] listening to others but spoke when asked a question. He stated that he believes balance is a health mix of activities and rest. He reported that he has too much free time and gets bored. When asked about hobbies and activities, he stated that he does not know how to fill his time, especially because he lives in a group home.   Christy Sartorius. 10/11/2011, 12:10 PM

## 2011-10-11 NOTE — Progress Notes (Signed)
[facility] Case Management Discharge Plan:  Will you be returning to the same living situation after discharge: Yes,  [name] will return to [facility] At discharge, do you have transportation home?:Yes,  Group home will provide transportation Do you have the ability to pay for your medications:Yes,  Patient has Medicaid  Interagency Information:     Release of information consent forms completed and in the chart;  Patient's signature needed at discharge.  Patient to Follow up at:  Follow-up Information    Follow up with [facility]. (Please go to [facility]'s walk in clinic on Friday, October 12, 2011 or any weekday between 8 AM and 3 PM)    Contact information:   [address] [place], [place]  [address]  [phone]         Patient denies SI/HI:   Yes,  [name] is no longer endorsing SI or other thoughts of self harm    Safety Planning and Suicide Prevention discussed:  Yes,  Reviewed individually with patient  Barrier to discharge identified:No.  Summary and Recommendations: Patient encourage to be compliant with medications and follow up with outpatient recommedations    [name], Joesph July 10/11/2011, 10:54 AM

## 2011-10-11 NOTE — Discharge Instructions (Signed)
You have got to love yourself before you can love anyone else.

## 2011-10-12 NOTE — Progress Notes (Signed)
Patient Discharge Instructions:  After Visit Summary (AVS):   Faxed to:  10/12/2011 Psychiatric Admission Assessment Note:   Faxed to:  10/12/2011 Suicide Risk Assessment - Discharge Assessment:   Faxed to:  10/12/2011 Faxed/Sent to the Next Level Care provider:  10/12/2011  Faxed to [facility] @ [phone]  Heloise Purpura Eduard Clos, 10/12/2011, 4:41 PM

## 2011-10-19 NOTE — Discharge Summary (Signed)
Physician Discharge Summary Note  Patient:  [name] is an 56 y.o., male MRN:  [id number] DOB:  13-Jan-1956 Patient phone:  [phone] (home)  Patient address:   [address] [place] [address],   Date of Admission:  10/06/2011 Date of Discharge: 10/11/11  Reason for Admission: Suicidal threats.  Discharge Diagnoses: Principal Problem:  *Suicidal ideation Active Problems:  Dysthymia  Depression   Axis Diagnosis:   AXIS I:  Suicdal threats and ideations. AXIS II:  Deferred AXIS III:   Past Medical History  Diagnosis Date  . Mental disorder   . Depression   . Neuromuscular disorder   . Multiple sclerosis, relapsing-remitting    AXIS IV:  other psychosocial or environmental problems AXIS V:  67  Level of Care:  OP  Hospital Course:  Resident of [facility] since January 2011. He has an ACT team receives psychotherapeutic rehab daily and is hoping to get the new drug for MS. His MS was diagnosed in 2000. I know this patient from prior admissions here and he also recently came to the [place].  He is NOT [place] and receives many more treatments and supportwhere he is using Medicare/Medicaid.  He had come to [place] looking for the new MS drug that his outside Neurologist was going to try to get him on once it became available. He has failed a number of MS treatments.  He has threatened suicide to be admitted many times.   While a patient in this hospital, [name] received medication management for his depressive mood symptoms. He was prescribed and received Abilify 2 mg for mood control, Wellbutrin XL 150 mg for depression and Citalopram 40 mg for depression as well. He was enrolled in group counseling sessions and activities in which he participated actively.  He also received medication management and monitoring for his other medical issues. He tolerated his treatment regimen without any adverse effects and or reactions. Patient did respond to his treatment plan. This is  evidenced by his daily reports of improved mood, reduction of symptoms and presentation of good affect. Patient attended treatment team meeting this am and met with the team. His symptoms, treatment regimen, response to treatment and discharge plans discussed. Patient endorsed that he is doing well and stable for discharge. He will continue psychiatric care on outpatient basis to maintain stability. He will follow-up at [facility] on 10/12/11 between the hours of 08:00 am and 03:00 pm. Patient is explained that this is a walk-in appointment between the hours of 08:00 am and 03:00 pm.  Upon discharge, [name] adamantly denies suicidal, homicidal ideations, auditory, visual hallucinations and or delusional thinking. As to what patient has learned from his stay in this hospital he stated that he needs to love himself before he can love anyone else. That he has heard this several times before, but this time it seemed to strike a chord with him. He left [facility] with all personal belongings via personal arranged transport with the [employer] home. He was in no apparent distress.  Consults:  None  Significant Diagnostic Studies:  labs: CBC with diff, CMP,   Discharge Vitals:   Blood pressure 108/66, pulse 81, temperature 97.5 F (36.4 C), temperature source Oral, resp. rate 18, height 5\' 11"  (1.803 m), weight 78.926 kg (174 lb).  Mental Status Exam: See Mental Status Examination and Suicide Risk Assessment completed by Attending Physician prior to discharge.  Discharge destination:  Other:  [facility]  Is patient on multiple  antipsychotic therapies at discharge:  No   Has Patient had three or more failed trials of antipsychotic monotherapy by history:  No  Recommended Plan for Multiple Antipsychotic Therapies: NA   Medication List  As of 10/19/2011  2:11 PM   STOP taking these medications         GILENYA 0.5 MG Caps      hydrOXYzine 50 MG capsule         TAKE these medications       Indication    amantadine 100 MG capsule   Commonly known as: SYMMETREL   Take 1 capsule (100 mg total) by mouth 2 (two) times daily. For MS       ARIPiprazole 2 MG tablet   Commonly known as: ABILIFY   Take 1 tablet (2 mg total) by mouth daily. For mood control.       buPROPion 150 MG 24 hr tablet   Commonly known as: WELLBUTRIN XL   Take 1 tablet (150 mg total) by mouth daily. For depression       citalopram 40 MG tablet   Commonly known as: CELEXA   Take 1 tablet (40 mg total) by mouth daily. For depression.       diclofenac 50 MG EC tablet   Commonly known as: VOLTAREN   Take 1 tablet (50 mg total) by mouth 2 (two) times daily. For inflammation       risperiDONE 2 MG tablet   Commonly known as: RISPERDAL   Take 1 tablet (2 mg total) by mouth at bedtime. For insomnia and mood control            Follow-up Information    Follow up with [facility]. (Please go to [facility]'s walk in clinic on Friday, October 12, 2011 or any weekday between 8 AM and 3 PM)    Contact information:   [address] [place], [place]  [address]  [phone]         Follow-up recommendations:  Activity:  as tolerated Other:  Keep all scheduled follow-up appointments as recommended.   Comments:  Take all your medications as prescribed by your mental healthcare provider. Report any adverse effects and or reactions from your medicines to your outpatient provider promptly. Patient is instructed and cautioned to not engage in alcohol and or illegal drug use while on prescription medicines. In the event of worsening symptoms, patient is instructed to call the crisis hotline, 911 and or go to the nearest ED for appropriate evaluation and treatment of symptoms. Follow-up with your primary care provider for your other medical issues, concerns and or health care needs.    Signed: Armandina Stammer I 10/19/2011, 2:11 PM

## 2011-10-23 NOTE — Discharge Summary (Signed)
I agree with this D/C Summary.  

## 2011-10-28 ENCOUNTER — Encounter (HOSPITAL_COMMUNITY): Payer: Self-pay

## 2011-10-28 ENCOUNTER — Emergency Department (HOSPITAL_COMMUNITY): Payer: Medicare Other

## 2011-10-28 ENCOUNTER — Inpatient Hospital Stay (HOSPITAL_COMMUNITY)
Admission: EM | Admit: 2011-10-28 | Discharge: 2011-11-02 | DRG: 917 | Disposition: A | Discharge status: Home / Self Care | Payer: Medicare Other | Attending: Internal Medicine | Admitting: Internal Medicine

## 2011-10-28 ENCOUNTER — Observation Stay (HOSPITAL_COMMUNITY): Payer: Medicare Other

## 2011-10-28 DIAGNOSIS — J9600 Acute respiratory failure, unspecified whether with hypoxia or hypercapnia: Secondary | ICD-10-CM | POA: Diagnosis present

## 2011-10-28 DIAGNOSIS — T4591XA Poisoning by unspecified primarily systemic and hematological agent, accidental (unintentional), initial encounter: Secondary | ICD-10-CM | POA: Diagnosis present

## 2011-10-28 DIAGNOSIS — R45851 Suicidal ideations: Secondary | ICD-10-CM

## 2011-10-28 DIAGNOSIS — J9601 Acute respiratory failure with hypoxia: Secondary | ICD-10-CM | POA: Diagnosis present

## 2011-10-28 DIAGNOSIS — Y92009 Unspecified place in unspecified non-institutional (private) residence as the place of occurrence of the external cause: Secondary | ICD-10-CM

## 2011-10-28 DIAGNOSIS — T50901A Poisoning by unspecified drugs, medicaments and biological substances, accidental (unintentional), initial encounter: Secondary | ICD-10-CM

## 2011-10-28 DIAGNOSIS — T450X4A Poisoning by antiallergic and antiemetic drugs, undetermined, initial encounter: Principal | ICD-10-CM | POA: Diagnosis present

## 2011-10-28 DIAGNOSIS — G35 Multiple sclerosis: Secondary | ICD-10-CM | POA: Diagnosis present

## 2011-10-28 DIAGNOSIS — F329 Major depressive disorder, single episode, unspecified: Secondary | ICD-10-CM | POA: Diagnosis present

## 2011-10-28 DIAGNOSIS — K560 Paralytic ileus: Secondary | ICD-10-CM | POA: Diagnosis present

## 2011-10-28 DIAGNOSIS — R569 Unspecified convulsions: Secondary | ICD-10-CM | POA: Diagnosis present

## 2011-10-28 DIAGNOSIS — T443X1A Poisoning by other parasympatholytics [anticholinergics and antimuscarinics] and spasmolytics, accidental (unintentional), initial encounter: Secondary | ICD-10-CM

## 2011-10-28 DIAGNOSIS — R4182 Altered mental status, unspecified: Secondary | ICD-10-CM | POA: Diagnosis present

## 2011-10-28 DIAGNOSIS — R0902 Hypoxemia: Secondary | ICD-10-CM | POA: Diagnosis present

## 2011-10-28 HISTORY — DX: Multiple sclerosis, unspecified: G35.D

## 2011-10-28 HISTORY — DX: Multiple sclerosis: G35

## 2011-10-28 LAB — COMPREHENSIVE METABOLIC PANEL
ALT: 30 U/L (ref 0–53)
ALT: 24 U/L (ref 0–53)
AST: 26 U/L (ref 0–37)
AST: 21 U/L (ref 0–37)
Albumin: 4 g/dL (ref 3.5–5.2)
Albumin: 3.3 g/dL — ABNORMAL LOW (ref 3.5–5.2)
Alkaline Phosphatase: 58 U/L (ref 39–117)
Alkaline Phosphatase: 47 U/L (ref 39–117)
BUN: 12 mg/dL (ref 6–23)
BUN: 11 mg/dL (ref 6–23)
CO2: 25 mEq/L (ref 19–32)
CO2: 20 mEq/L (ref 19–32)
Calcium: 9.4 mg/dL (ref 8.4–10.5)
Calcium: 8.3 mg/dL — ABNORMAL LOW (ref 8.4–10.5)
Chloride: 102 mEq/L (ref 96–112)
Chloride: 104 mEq/L (ref 96–112)
Creatinine, Ser: 0.96 mg/dL (ref 0.50–1.35)
Creatinine, Ser: 0.88 mg/dL (ref 0.50–1.35)
GFR calc Af Amer: >90 mL/min (ref >90)
GFR calc Af Amer: >90 mL/min (ref >90)
GFR calc non Af Amer: >90 mL/min (ref >90)
GFR calc non Af Amer: >90 mL/min (ref >90)
Glucose, Bld: 65 mg/dL — ABNORMAL LOW (ref 70–99)
Glucose, Bld: 48 mg/dL — ABNORMAL LOW (ref 70–99)
Potassium: 3.7 mEq/L (ref 3.5–5.1)
Potassium: 3.4 mEq/L — ABNORMAL LOW (ref 3.5–5.1)
Sodium: 139 mEq/L (ref 135–145)
Sodium: 138 mEq/L (ref 135–145)
Total Bilirubin: 0.4 mg/dL (ref 0.3–1.2)
Total Bilirubin: 0.6 mg/dL (ref 0.3–1.2)
Total Protein: 7.2 g/dL (ref 6.0–8.3)
Total Protein: 5.7 g/dL — ABNORMAL LOW (ref 6.0–8.3)

## 2011-10-28 LAB — CARDIAC PANEL(CRET KIN+CKTOT+MB+TROPI)
CK, MB: 1.5 ng/mL (ref 0.3–4.0)
Relative Index: 1.3 (ref 0.0–2.5)
Total CK: 119 U/L (ref 7–232)
Troponin I: <0.3 ng/mL (ref <0.30)

## 2011-10-28 LAB — URINALYSIS, ROUTINE W REFLEX MICROSCOPIC
Bilirubin Urine: NEGATIVE
Bilirubin Urine: NEGATIVE
Glucose, UA: NEGATIVE mg/dL
Glucose, UA: 250 mg/dL — AB
Hgb urine dipstick: NEGATIVE
Hgb urine dipstick: NEGATIVE
Ketones, ur: NEGATIVE mg/dL
Ketones, ur: NEGATIVE mg/dL
Leukocytes, UA: NEGATIVE
Leukocytes, UA: NEGATIVE
Nitrite: NEGATIVE
Nitrite: NEGATIVE
Protein, ur: NEGATIVE mg/dL
Protein, ur: NEGATIVE mg/dL
Specific Gravity, Urine: 1.012 (ref 1.005–1.030)
Specific Gravity, Urine: 1.02 (ref 1.005–1.030)
Urobilinogen, UA: 0.2 mg/dL (ref 0.0–1.0)
Urobilinogen, UA: 0.2 mg/dL (ref 0.0–1.0)
pH: 6 (ref 5.0–8.0)
pH: 5.5 (ref 5.0–8.0)

## 2011-10-28 LAB — CBC WITH DIFFERENTIAL/PLATELET
Basophils Absolute: 0 10*3/uL (ref 0.0–0.1)
Basophils Relative: 0 % (ref 0–1)
Eosinophils Absolute: 0.1 10*3/uL (ref 0.0–0.7)
Eosinophils Relative: 1 % (ref 0–5)
HCT: 41.4 % (ref 39.0–52.0)
Hemoglobin: 14.4 g/dL (ref 13.0–17.0)
Lymphocytes Relative: 26 % (ref 12–46)
Lymphs Abs: 2 10*3/uL (ref 0.7–4.0)
MCH: 30.6 pg (ref 26.0–34.0)
MCHC: 34.8 g/dL (ref 30.0–36.0)
MCV: 88.1 fL (ref 78.0–100.0)
Monocytes Absolute: 0.4 10*3/uL (ref 0.1–1.0)
Monocytes Relative: 6 % (ref 3–12)
Neutro Abs: 5.2 10*3/uL (ref 1.7–7.7)
Neutrophils Relative %: 68 % (ref 43–77)
Platelets: 198 10*3/uL (ref 150–400)
RBC: 4.7 MIL/uL (ref 4.22–5.81)
RDW: 15.2 % (ref 11.5–15.5)
WBC: 7.7 10*3/uL (ref 4.0–10.5)

## 2011-10-28 LAB — PROTIME-INR
INR: 0.96 (ref 0.00–1.49)
Prothrombin Time: 13 seconds (ref 11.6–15.2)

## 2011-10-28 LAB — BLOOD GAS, ARTERIAL
Acid-base deficit: 4 mmol/L — ABNORMAL HIGH (ref 0.0–2.0)
Acid-base deficit: 3.3 mmol/L — ABNORMAL HIGH (ref 0.0–2.0)
Bicarbonate: 21.1 mEq/L (ref 20.0–24.0)
Bicarbonate: 21.2 mEq/L (ref 20.0–24.0)
Drawn by: 336861
Drawn by: 308601
FIO2: 0.6 %
FIO2: 0.4 %
MECHVT: 500 mL
MECHVT: 500 mL
O2 Saturation: 98.7 %
O2 Saturation: 98.1 %
PEEP: 5 cmH2O
PEEP: 5 cmH2O
Patient temperature: 98.6
Patient temperature: 98.6
RATE: 18 resp/min
RATE: 18 resp/min
TCO2: 19.3 mmol/L (ref 0–100)
TCO2: 19.4 mmol/L (ref 0–100)
pCO2 arterial: 40.8 mmHg (ref 35.0–45.0)
pCO2 arterial: 38.1 mmHg (ref 35.0–45.0)
pH, Arterial: 7.333 — ABNORMAL LOW (ref 7.350–7.450)
pH, Arterial: 7.364 (ref 7.350–7.450)
pO2, Arterial: 170 mmHg — ABNORMAL HIGH (ref 80.0–100.0)
pO2, Arterial: 113 mmHg — ABNORMAL HIGH (ref 80.0–100.0)

## 2011-10-28 LAB — DIFFERENTIAL
Basophils Absolute: 0 10*3/uL (ref 0.0–0.1)
Basophils Relative: 0 % (ref 0–1)
Eosinophils Absolute: 0.1 10*3/uL (ref 0.0–0.7)
Eosinophils Relative: 1 % (ref 0–5)
Lymphocytes Relative: 19 % (ref 12–46)
Lymphs Abs: 1.1 10*3/uL (ref 0.7–4.0)
Monocytes Absolute: 0.4 10*3/uL (ref 0.1–1.0)
Monocytes Relative: 7 % (ref 3–12)
Neutro Abs: 4.3 10*3/uL (ref 1.7–7.7)
Neutrophils Relative %: 73 % (ref 43–77)

## 2011-10-28 LAB — ETHANOL: Alcohol, Ethyl (B): 91 mg/dL — ABNORMAL HIGH (ref 0–11)

## 2011-10-28 LAB — CBC
HCT: 35.9 % — ABNORMAL LOW (ref 39.0–52.0)
Hemoglobin: 12.2 g/dL — ABNORMAL LOW (ref 13.0–17.0)
MCH: 29.9 pg (ref 26.0–34.0)
MCHC: 34 g/dL (ref 30.0–36.0)
MCV: 88 fL (ref 78.0–100.0)
Platelets: 174 10*3/uL (ref 150–400)
RBC: 4.08 MIL/uL — ABNORMAL LOW (ref 4.22–5.81)
RDW: 15.3 % (ref 11.5–15.5)
WBC: 5.9 10*3/uL (ref 4.0–10.5)

## 2011-10-28 LAB — APTT: aPTT: 26 seconds (ref 24–37)

## 2011-10-28 LAB — RAPID URINE DRUG SCREEN, HOSP PERFORMED
Amphetamines: NOT DETECTED
Barbiturates: NOT DETECTED
Benzodiazepines: NOT DETECTED
Cocaine: NOT DETECTED
Opiates: NOT DETECTED
Tetrahydrocannabinol: NOT DETECTED

## 2011-10-28 LAB — CK: Total CK: 117 U/L (ref 7–232)

## 2011-10-28 LAB — ACETAMINOPHEN LEVEL: Acetaminophen (Tylenol), Serum: <15 ug/mL (ref 10–30)

## 2011-10-28 LAB — PHOSPHORUS: Phosphorus: 3.5 mg/dL (ref 2.3–4.6)

## 2011-10-28 LAB — PRO B NATRIURETIC PEPTIDE: Pro B Natriuretic peptide (BNP): 346.6 pg/mL — ABNORMAL HIGH (ref 0–125)

## 2011-10-28 LAB — LIPASE, BLOOD: Lipase: 54 U/L (ref 11–59)

## 2011-10-28 LAB — SALICYLATE LEVEL: Salicylate Lvl: <2 mg/dL — ABNORMAL LOW (ref 2.8–20.0)

## 2011-10-28 LAB — PROCALCITONIN: Procalcitonin: <0.1 ng/mL

## 2011-10-28 LAB — MAGNESIUM: Magnesium: 1.5 mg/dL (ref 1.5–2.5)

## 2011-10-28 LAB — AMYLASE: Amylase: 78 U/L (ref 0–105)

## 2011-10-28 LAB — LACTIC ACID, PLASMA: Lactic Acid, Venous: 3 mmol/L — ABNORMAL HIGH (ref 0.5–2.2)

## 2011-10-28 MED ORDER — SODIUM BICARBONATE 8.4 % IV SOLN
50.0000 meq | Freq: Once | INTRAVENOUS | Status: AC
  Administered 2011-10-28: 50 meq via INTRAVENOUS
  Filled 2011-10-28: qty 50

## 2011-10-28 MED ORDER — SUCCINYLCHOLINE CHLORIDE 20 MG/ML IJ SOLN
100.0000 mg | Freq: Once | INTRAMUSCULAR | Status: AC
  Administered 2011-10-28: 100 mg via INTRAVENOUS

## 2011-10-28 MED ORDER — PROPOFOL 10 MG/ML IV EMUL: 5.0000 ug/kg/min | INTRAVENOUS | Status: DC

## 2011-10-28 MED ORDER — ROCURONIUM BROMIDE 50 MG/5ML IV SOLN
INTRAVENOUS | Status: AC
  Filled 2011-10-28: qty 2

## 2011-10-28 MED ORDER — SODIUM CHLORIDE 0.9 % IV SOLN: 250.0000 mL | INTRAVENOUS | Status: DC | PRN

## 2011-10-28 MED ORDER — DEXTROSE-NACL 5-0.45 % IV SOLN: INTRAVENOUS | Status: AC

## 2011-10-28 MED ORDER — LORAZEPAM 2 MG/ML IJ SOLN
1.0000 mg | Freq: Once | INTRAMUSCULAR | Status: DC
  Filled 2011-10-28: qty 1

## 2011-10-28 MED ORDER — HEPARIN SODIUM (PORCINE) 5000 UNIT/ML IJ SOLN
5000.0000 [IU] | Freq: Three times a day (TID) | INTRAMUSCULAR | Status: DC
  Administered 2011-10-28 – 2011-11-02 (×15): 5000 [IU] via SUBCUTANEOUS
  Filled 2011-10-28 (×18): qty 1

## 2011-10-28 MED ORDER — SUCCINYLCHOLINE CHLORIDE 20 MG/ML IJ SOLN
INTRAMUSCULAR | Status: AC
  Filled 2011-10-28: qty 5

## 2011-10-28 MED ORDER — PROPOFOL 10 MG/ML IV EMUL
INTRAVENOUS | Status: AC
  Administered 2011-10-29: 50 ug/kg/min via INTRAVENOUS
  Filled 2011-10-28: qty 100

## 2011-10-28 MED ORDER — CHLORHEXIDINE GLUCONATE 0.12 % MT SOLN
15.0000 mL | Freq: Two times a day (BID) | OROMUCOSAL | Status: DC
Start: 2011-10-28 — End: 2011-10-31
  Administered 2011-10-28 – 2011-10-30 (×4): 15 mL via OROMUCOSAL
  Filled 2011-10-28 (×4): qty 15

## 2011-10-28 MED ORDER — ALBUTEROL SULFATE HFA 108 (90 BASE) MCG/ACT IN AERS: 4.0000 | INHALATION_SPRAY | RESPIRATORY_TRACT | Status: DC | PRN

## 2011-10-28 MED ORDER — ETOMIDATE 2 MG/ML IV SOLN
INTRAVENOUS | Status: AC
  Filled 2011-10-28: qty 20

## 2011-10-28 MED ORDER — ETOMIDATE 2 MG/ML IV SOLN
30.0000 mg | Freq: Once | INTRAVENOUS | Status: AC
  Administered 2011-10-28: 30 mg via INTRAVENOUS

## 2011-10-28 MED ORDER — DEXTROSE-NACL 5-0.45 % IV SOLN
INTRAVENOUS | Status: DC
  Administered 2011-10-28: 23:00:00 via INTRAVENOUS
  Administered 2011-10-29 – 2011-10-30 (×3): 75 mL/h via INTRAVENOUS

## 2011-10-28 MED ORDER — MIDAZOLAM HCL 2 MG/2ML IJ SOLN: 2.0000 mg | INTRAMUSCULAR | Status: DC | PRN

## 2011-10-28 MED ORDER — PHENOBARBITAL SODIUM 130 MG/ML IJ SOLN
2000.0000 mg | Freq: Once | INTRAMUSCULAR | Status: AC
  Administered 2011-10-28: 2000 mg via INTRAVENOUS
  Filled 2011-10-28 (×2): qty 15.38

## 2011-10-28 MED ORDER — LIDOCAINE HCL (CARDIAC) 20 MG/ML IV SOLN
INTRAVENOUS | Status: AC
  Filled 2011-10-28: qty 5

## 2011-10-28 MED ORDER — PANTOPRAZOLE SODIUM 40 MG IV SOLR
40.0000 mg | Freq: Every day | INTRAVENOUS | Status: DC
  Administered 2011-10-28 – 2011-10-30 (×3): 40 mg via INTRAVENOUS
  Filled 2011-10-28 (×4): qty 40

## 2011-10-28 MED ORDER — SODIUM CHLORIDE 0.9 % IV SOLN: Freq: Once | INTRAVENOUS | Status: DC

## 2011-10-28 MED ORDER — PROPOFOL 10 MG/ML IV EMUL
5.0000 ug/kg/min | INTRAVENOUS | Status: DC
  Administered 2011-10-28: 15 ug/kg/min via INTRAVENOUS
  Administered 2011-10-29: 35 ug/kg/min via INTRAVENOUS
  Administered 2011-10-29: 20 ug/kg/min via INTRAVENOUS
  Administered 2011-10-29 (×2): 35 ug/kg/min via INTRAVENOUS
  Administered 2011-10-29: 25 ug/kg/min via INTRAVENOUS
  Filled 2011-10-28 (×5): qty 100

## 2011-10-28 MED ORDER — DEXTROSE 50 % IV SOLN
50.0000 mL | Freq: Once | INTRAVENOUS | Status: AC
  Administered 2011-10-28: 50 mL via INTRAVENOUS
  Filled 2011-10-28: qty 50

## 2011-10-28 MED ORDER — CHARCOAL ACTIVATED PO LIQD
50.0000 g | Freq: Once | ORAL | Status: AC
  Administered 2011-10-28: 50 g via ORAL
  Filled 2011-10-28: qty 240

## 2011-10-28 MED ORDER — LORAZEPAM 2 MG/ML IJ SOLN
2.0000 mg | Freq: Once | INTRAMUSCULAR | Status: AC
  Administered 2011-10-28: 2 mg via INTRAVENOUS

## 2011-10-28 MED ORDER — BIOTENE DRY MOUTH MT LIQD
15.0000 mL | Freq: Four times a day (QID) | OROMUCOSAL | Status: DC
  Administered 2011-10-29 – 2011-10-30 (×7): 15 mL via OROMUCOSAL

## 2011-10-28 MED ORDER — SODIUM CHLORIDE 0.9 % IV BOLUS (SEPSIS)
2000.0000 mL | Freq: Once | INTRAVENOUS | Status: AC
  Administered 2011-10-28: 1000 mL via INTRAVENOUS

## 2011-10-28 MED ORDER — FENTANYL CITRATE 0.05 MG/ML IJ SOLN
50.0000 ug | INTRAMUSCULAR | Status: DC | PRN
  Administered 2011-10-29: 100 ug via INTRAVENOUS
  Filled 2011-10-28: qty 2

## 2011-10-28 MED ORDER — SODIUM CHLORIDE 0.9 % IV SOLN: INTRAVENOUS | Status: DC

## 2011-10-28 MED ORDER — PHENOBARBITAL SODIUM 65 MG/ML IJ SOLN: 20.0000 mg/kg | Freq: Once | INTRAMUSCULAR | Status: DC

## 2011-10-28 NOTE — ED Notes (Signed)
Pt resting at this time, Propofol infusing at (6.25ml/hr). NG producing dark brown fluid. VSS

## 2011-10-28 NOTE — ED Notes (Signed)
Per EMS- Patient took 96 tabs Unisom and a 6 pack beer. Patient reports that he was trying to kill himself and had done so several times before.  Patient denies  Homicidal ideations.

## 2011-10-28 NOTE — ED Provider Notes (Signed)
History     CSN: [id number]  Arrival date & time 10/28/11  1742   First MD Initiated Contact with Patient 10/28/11 1745      Chief Complaint  Patient presents with  . Drug Overdose    (Consider location/radiation/quality/duration/timing/severity/associated sxs/prior treatment) HPI Per EMS- Patient took 96 tabs Unisom and a 6 pack beer. Per EMS Patient reports that he was trying to kill himself and had done so several times before. The patient presents with altered mental status and is unable to provide any history of present illness, past medical history or review of systems. The patient arrives somewhat anxious and agitated and is able to only follow a few simple commands. Is unknown the time of his ingestion.  Past Medical History  Diagnosis Date  . MS (multiple sclerosis)     History reviewed. No pertinent past surgical history.  No family history on file.  History  Substance Use Topics  . Smoking status: Unknown If Ever Smoked  . Smokeless tobacco: Not on file  . Alcohol Use: Yes     drank 6 pack prior to ED arrival      Review of Systems  Unable to perform ROS: Mental status change    Allergies  Review of patient's allergies indicates no known allergies.  Home Medications  No current outpatient prescriptions on file.  There were no vitals taken for this visit.  Physical Exam  Nursing note and vitals reviewed. Constitutional:       Awake, alert, nontoxic appearance but somewhat anxious and agitated at times  HENT:  Head: Atraumatic.  Eyes: Right eye exhibits no discharge. Left eye exhibits no discharge.  Neck: Neck supple.  Cardiovascular: Normal rate and regular rhythm.   No murmur heard. Pulmonary/Chest: Effort normal and breath sounds normal. No respiratory distress. He has no wheezes. He has no rales. He exhibits no tenderness.  Abdominal: Soft. Bowel sounds are normal. There is no tenderness. There is no rebound.  Musculoskeletal: He exhibits no  edema and no tenderness.       Baseline ROM, no obvious new focal weakness.  Neurological: He is alert.       Patient appears completely disoriented and does not even seem to know his name, he only follows a few simple commands at times but moves all 4 extremities well with no apparent lateralizing weakness.  Skin: No rash noted.  Psychiatric:       Disoriented and unable to provide any psychiatric history himself upon arrival to the ED    ED Course  Procedures (including critical care time) ECG: Normal sinus rhythm, ventricular rate 84, normal axis, nonspecific intraventricular conduction delay with QRS interval 114 ms, nonspecific ST and T wave abnormalities with artifact present, no comparison ECG available  Patient had brief witnessed generalized seizure after vomiting possible aspiration and woke up confused and agitated again so the decision was made to protect his airway with rapid sequence intubation.  Procedure: RSI after time out performed, using etomidate and succinylcholine as well as video laryngoscopy an 8ml ETT was easily placed with one attempt with pulse oximetry remaining 100% pre-during and post procedure with the patient tolerating this well with stable vital signs no apparent immediate complications, positive end tidal CO2 was obtained, positive for equal bilateral breath sounds were heard and absent over the epigastrium2000  Phenobarbital was ordered per the direction of [employer].2000 D/w Crit Care for admit2010  Repeat ECG: Sinus rhythm, ventricular rate 81, normal axis, QRS interval has  decreased slightly to 108 ms, nonspecific T wave changes present, compared with ECG from earlier this visit QRS duration has shortened slightly  CRITICAL CARE Performed by: Hurman Horn   Total critical care time:  Critical care time was exclusive of separately billable procedures and treating other patients.  Critical care was necessary to treat or prevent  imminent or life-threatening deterioration.  Critical care was time spent personally by me on the following activities: development of treatment plan with patient and/or surrogate as well as nursing, discussions with consultants, evaluation of patient's response to treatment, examination of patient, obtaining history from patient or surrogate, ordering and performing treatments and interventions, ordering and review of laboratory studies, ordering and review of radiographic studies, pulse oximetry and re-evaluation of patient's condition.  Labs Reviewed  CBC WITH DIFFERENTIAL  COMPREHENSIVE METABOLIC PANEL  URINALYSIS, ROUTINE W REFLEX MICROSCOPIC  ACETAMINOPHEN LEVEL  SALICYLATE LEVEL  ETHANOL  URINE RAPID DRUG SCREEN (HOSP PERFORMED)    Dx: Overdose Anticholinergic syndrome   MDM  The patient appears reasonably stabilized for admission considering the current resources, flow, and capabilities available in the ED at this time, and I doubt any other [facility] requiring further screening and/or treatment in the ED prior to admission.        Hurman Horn, MD 10/29/11 [phone]

## 2011-10-28 NOTE — H&P (Signed)
Name: [name] MRN: [id number] DOB: [birth year]    LOS: 0  PULMONARY / CRITICAL CARE MEDICINE  HPI:   56 year old male with PMH relevant for multiple sclerosis, depression and previous suicidal attempt. Presents to the ED after ingesting 96 tablets of doxylamine. As per the ER personal, the patient was confused, agitated, delirious and had a witnessed seizure. He required intubation for airway protection. At the time of my examination the patient is intubated, sedated with propofol, calm, in no distress and hemodynamically stable. Patient unable to provide history. Information gathered from electronic records.   Past Medical History  Diagnosis Date  . MS (multiple sclerosis)    History reviewed. No pertinent past surgical history. Prior to Admission medications   Not on File   Allergies No Known Allergies  Family History No family history on file. Social History  has an unknown smoking status. He does not have any smokeless tobacco history on file. He reports that he drinks alcohol. His drug history not on file.  Review Of Systems:  Unable to provide.   Current Status:  Vital Signs: Pulse Rate:  [81-92] 81  (07/07 2000) Resp:  [18-28] 18  (07/07 2000) BP: (109-162)/(68-115) 118/75 mmHg (07/07 2000) SpO2:  [89 %-100 %] 100 % (07/07 2000) FiO2 (%):  [60 %] 60 % (07/07 2000) Weight:  [230 lb (104.327 kg)] 230 lb (104.327 kg) (07/07 2000)  Physical Examination: General:  Intubated, mechanically ventilated, no acute distress Neuro:  Sedated, synchronous, nonfocal HEENT:  Dilated pupils, very poor reaction to light, pink conjunctivae. ET tube in place.  Neck:  Supple, no JVD   Cardiovascular:  RRR, no M/R/G Lungs:  CTA, no W/R/R Abdomen:  Soft, nontender, nondistended, diminished bowel sounds  Musculoskeletal: 1+ pedal edema Skin:  No rash   ASSESSMENT AND PLAN 56 year old male with MS and depression with previous suicidal attempts. Presents to the ED after  ingesting 96 tablets of doxylamine. It has anti H1 and anticholinergic effects. At admission confused and agitated, then seized and was intubated for airway protection. EKG NSR with QRS . Chest X ray with no clear acute infiltrates.  No fever and normal WBC. No acute intracranial process on CT of the head. Urine drug screen is negative.  PULMONARY  Lab 10/28/11 2021  PHART 7.333*  PCO2ART 40.8  PO2ART 170.0*  HCO3 21.1  O2SAT 98.7   Ventilator Settings: Vent Mode:  [-] PRVC FiO2 (%):  [60 %] 60 % Set Rate:  [18 bmp] 18 bmp Vt Set:  [500 mL] 500 mL CXR:  No clear acute infiltrates. ETT:  Adequate position.  A:   1) Respiratory failure due to inability to protect his airway.  P:   - Mechanical ventilation  -PRVC, Vt 8cc/kg, PEEP: 5, RR: 18, FiO2 50%  - VAP prevention order set.  - Proventil PRN for wheezing  - Daily awakening and SBT in am if indicated.   CARDIOVASCULAR No results found for this basename: TROPONINI:5,LATICACIDVEN:5, O2SATVEN:5,PROBNP:5 in the last 168 hours ECG:  NSR with QRS 114 ms Lines: two 18 peripherals  A:  1) Hemodynamically stable 2) EGG with widened QRS (114 ms) P:  - ICU monitoring - EKG q4 hrs - Bicarbonate boluses for QRS >  RENAL  Lab 10/28/11 1749  NA 139  K 3.7  CL 102  CO2 25  BUN 12  CREATININE 0.96  CALCIUM 9.4  MG --  PHOS --   Intake/Output    None  Foley:  Placed 10/28/11  A:   1) Normal creatinine P:   - Will monitor BMP and CK  GASTROINTESTINAL  Lab 10/28/11 1749  AST 26  ALT 30  ALKPHOS 58  BILITOT 0.4  PROT 7.2  ALBUMIN 4.0    A:   1) Ileus likely secondary to anticholinergic effect P:   - OG tube in place - GI prophylaxis with IV protonix  HEMATOLOGIC  Lab 10/28/11 1749  HGB 14.4  HCT 41.4  PLT 198  INR --  APTT --   A:   No issues P:  - Will monitor CBC in am  INFECTIOUS  Lab 10/28/11 1749  WBC 7.7  PROCALCITON --   Cultures: - Will send today Antibiotics: No  antibiotics for now  A:   1) No evidence of infectious process P:   - We will follow cultures and start antibiotics if any evidence of infection or hemodynamic instability.  ENDOCRINE No results found for this basename: GLUCAP:5 in the last 168 hours A:  No issues  P:   _ POCT glucose q8 hrs  NEUROLOGIC  A:   1) Anticholinergic drug overdose 2) Seizure 3) Urine drug screen negative.  4) Alcohol intoxication P:   - No role for physostigmine, patient intubated and hemodynamically stable. - Continuous sedation with propofol - Pain control with PRN fentanyl - Acetaminophen level sent.  BEST PRACTICE / DISPOSITION - Level of Care:  ICU - Primary Service:  PCCM - Consultants:  None - Code Status:  Full - Diet:  NPO - DVT Px:  Heparin - GI Px:  Protonix - Skin Integrity:  Intact - Social / Family:  No family available.  The patient is critically ill with multiple organ systems failure and requires high complexity decision making for assessment and support, frequent evaluation and titration of therapies, application of advanced monitoring technologies and extensive interpretation of multiple databases.   Critical Care Time devoted to patient care services described in this note is: 1 Hour  Overton Mam, M.D. Pulmonary and Critical Care Medicine [facility] Pager: [phone]  10/28/2011, 9:14 PM

## 2011-10-28 NOTE — ED Notes (Signed)
Pt resting at this time, no movement noted. RT called for transport to ICU

## 2011-10-28 NOTE — ED Notes (Signed)
Dr. Fonnie Jarvis requesting intubation of pt for airway management, RT at bedside - 30mg  etomidate, 100mg  succinylcholine order for RIS intubtion. This RN, Fleet Contras, Charge RN and Gladstone Lighter, RN at bedside.

## 2011-10-28 NOTE — Progress Notes (Addendum)
Pharmacy - Phenobarbital Dosing  Pharmacy received order for Phenobarbital 20 mg/kg x 1 as loading dose for actively seizing patient s/p Unisom overdose.  We were informed by nursing staff that Phenobarbital was the agent recommended by the [facility] (we also called the center to confirm)  Pt weighs an estimated 104 kg.  A total of 2000 mg dose was sent to patient.  Patient's RN was made aware of the slow administration needed for phenobarbital and expressed understanding.    Geoffry Paradise, PharmD, BCPS Pager: [phone] 9:44 PM

## 2011-10-28 NOTE — ED Notes (Signed)
Proprofol increased to d/t movement

## 2011-10-28 NOTE — Progress Notes (Addendum)
eLink Physician-Brief Progress Note Patient Name: [name] DOB: 09/07/1955 MRN: [id number]  Date of Service  10/28/2011   HPI/Events of Note  Call from [place]. Very few details per him. Patient reportedly took 96 tablets of unisom that has anti-histamime H1 doxylamine. In ER, confused, agitated, delirious and then ultimately seized and intubated.   Per Dr Fonnie Jarvis poison control had advised phenobarb  Lab review shows QRS interval on EKG  eICU Interventions  No role for physostigmine due to QRS > Give atctivated charcoal via ng now that he is intubated PCCM will admit D/w Poison Control  -  Check CPK for rhabdo (ordered)  - watch for urinary retenion - place foley  - watch urine output   - watch temp   - no role for physostigmine due to intubation status  - avoid paralytic so we can monitor for seizures  - she agrees with charcoal x 1 dose  - recommends repeat q4h ekg and bicarb boluses as opposed to ddrip for QRS > (prefers boluses) - she agrees with diprivan for seizures and sedation. She does not feel that phenobarb is needed if diprivan and benzo is controlling seizures (earlier one dose phenobarb given per her advice because she thought patient in refratory seizures)   Intervention Category Major Interventions: Other:  [name] 10/28/2011, 8:20 PM

## 2011-10-29 ENCOUNTER — Other Ambulatory Visit: Payer: Self-pay

## 2011-10-29 DIAGNOSIS — R4182 Altered mental status, unspecified: Secondary | ICD-10-CM

## 2011-10-29 DIAGNOSIS — T50992A Poisoning by other drugs, medicaments and biological substances, intentional self-harm, initial encounter: Secondary | ICD-10-CM

## 2011-10-29 DIAGNOSIS — J9601 Acute respiratory failure with hypoxia: Secondary | ICD-10-CM | POA: Diagnosis present

## 2011-10-29 DIAGNOSIS — J9600 Acute respiratory failure, unspecified whether with hypoxia or hypercapnia: Secondary | ICD-10-CM

## 2011-10-29 DIAGNOSIS — T443X1A Poisoning by other parasympatholytics [anticholinergics and antimuscarinics] and spasmolytics, accidental (unintentional), initial encounter: Secondary | ICD-10-CM

## 2011-10-29 DIAGNOSIS — T50901A Poisoning by unspecified drugs, medicaments and biological substances, accidental (unintentional), initial encounter: Secondary | ICD-10-CM | POA: Diagnosis present

## 2011-10-29 LAB — CARDIAC PANEL(CRET KIN+CKTOT+MB+TROPI)
CK, MB: 1.8 ng/mL (ref 0.3–4.0)
CK, MB: 1.3 ng/mL (ref 0.3–4.0)
Relative Index: 0.2 (ref 0.0–2.5)
Relative Index: 0.2 (ref 0.0–2.5)
Total CK: 791 U/L — ABNORMAL HIGH (ref 7–232)
Total CK: 576 U/L — ABNORMAL HIGH (ref 7–232)
Troponin I: <0.3 ng/mL (ref <0.30)
Troponin I: <0.3 ng/mL (ref <0.30)

## 2011-10-29 LAB — BASIC METABOLIC PANEL
BUN: 10 mg/dL (ref 6–23)
CO2: 25 mEq/L (ref 19–32)
Calcium: 8.1 mg/dL — ABNORMAL LOW (ref 8.4–10.5)
Chloride: 107 mEq/L (ref 96–112)
Creatinine, Ser: 0.92 mg/dL (ref 0.50–1.35)
GFR calc Af Amer: >90 mL/min (ref >90)
GFR calc non Af Amer: >90 mL/min (ref >90)
Glucose, Bld: 69 mg/dL — ABNORMAL LOW (ref 70–99)
Potassium: 3.5 mEq/L (ref 3.5–5.1)
Sodium: 140 mEq/L (ref 135–145)

## 2011-10-29 LAB — BLOOD GAS, ARTERIAL
Acid-Base Excess: 0.2 mmol/L (ref 0.0–2.0)
Bicarbonate: 24.7 mEq/L — ABNORMAL HIGH (ref 20.0–24.0)
Drawn by: 336861
FIO2: 0.3 %
MECHVT: 500 mL
O2 Saturation: 96.4 %
PEEP: 5 cmH2O
Patient temperature: 98.6
RATE: 18 resp/min
TCO2: 22.4 mmol/L (ref 0–100)
pCO2 arterial: 41.6 mmHg (ref 35.0–45.0)
pH, Arterial: 7.391 (ref 7.350–7.450)
pO2, Arterial: 83 mmHg (ref 80.0–100.0)

## 2011-10-29 LAB — GLUCOSE, CAPILLARY
Glucose-Capillary: 136 mg/dL — ABNORMAL HIGH (ref 70–99)
Glucose-Capillary: 67 mg/dL — ABNORMAL LOW (ref 70–99)
Glucose-Capillary: 78 mg/dL (ref 70–99)
Glucose-Capillary: 81 mg/dL (ref 70–99)
Glucose-Capillary: 84 mg/dL (ref 70–99)
Glucose-Capillary: 89 mg/dL (ref 70–99)

## 2011-10-29 LAB — CBC
HCT: 36.2 % — ABNORMAL LOW (ref 39.0–52.0)
Hemoglobin: 12.4 g/dL — ABNORMAL LOW (ref 13.0–17.0)
MCH: 30.5 pg (ref 26.0–34.0)
MCHC: 34.3 g/dL (ref 30.0–36.0)
MCV: 88.9 fL (ref 78.0–100.0)
Platelets: 172 10*3/uL (ref 150–400)
RBC: 4.07 MIL/uL — ABNORMAL LOW (ref 4.22–5.81)
RDW: 15.6 % — ABNORMAL HIGH (ref 11.5–15.5)
WBC: 7.8 10*3/uL (ref 4.0–10.5)

## 2011-10-29 LAB — CORTISOL: Cortisol, Plasma: 13.2 ug/dL

## 2011-10-29 LAB — TYPE AND SCREEN
ABO/RH(D): A POS
Antibody Screen: NEGATIVE

## 2011-10-29 LAB — ABO/RH: ABO/RH(D): A POS

## 2011-10-29 LAB — PHOSPHORUS: Phosphorus: 3.5 mg/dL (ref 2.3–4.6)

## 2011-10-29 LAB — MAGNESIUM: Magnesium: 1.5 mg/dL (ref 1.5–2.5)

## 2011-10-29 LAB — MRSA PCR SCREENING: MRSA by PCR: NEGATIVE

## 2011-10-29 MED ORDER — FOLIC ACID 5 MG/ML IJ SOLN
1.0000 mg | Freq: Every day | INTRAMUSCULAR | Status: DC
  Administered 2011-10-29 – 2011-10-30 (×2): 1 mg via INTRAVENOUS
  Filled 2011-10-29 (×4): qty 0.2

## 2011-10-29 MED ORDER — DEXTROSE 50 % IV SOLN
INTRAVENOUS | Status: AC
  Filled 2011-10-29: qty 50

## 2011-10-29 MED ORDER — DEXTROSE 50 % IV SOLN
25.0000 mL | Freq: Once | INTRAVENOUS | Status: AC
  Administered 2011-10-29: 25 mL via INTRAVENOUS
  Filled 2011-10-29: qty 50

## 2011-10-29 MED ORDER — PROPOFOL 10 MG/ML IV EMUL
5.0000 ug/kg/min | INTRAVENOUS | Status: DC
  Administered 2011-10-29: 50 ug/kg/min via INTRAVENOUS
  Administered 2011-10-29: 40 ug/kg/min via INTRAVENOUS
  Administered 2011-10-30 (×2): 50 ug/kg/min via INTRAVENOUS
  Filled 2011-10-29 (×3): qty 100

## 2011-10-29 MED ORDER — THIAMINE HCL 100 MG/ML IJ SOLN
100.0000 mg | Freq: Every day | INTRAMUSCULAR | Status: DC
  Administered 2011-10-29 – 2011-10-30 (×2): 100 mg via INTRAVENOUS
  Filled 2011-10-29 (×3): qty 1

## 2011-10-29 MED ORDER — SODIUM CHLORIDE 0.9 % IV SOLN
INTRAVENOUS | Status: DC
  Administered 2011-10-29: 10 mL/h via INTRAVENOUS

## 2011-10-29 NOTE — Progress Notes (Signed)
INITIAL ADULT NUTRITION ASSESSMENT Date: 10/29/2011   Time: 11:35 AM Reason for Assessment: low braden, vent  ASSESSMENT: Male 56 y.o.  Dx: Anticholinergic drug overdose, seizure, alcohol intoxication, ileus, respiratory failure.    Hx:  Past Medical History  Diagnosis Date  . MS (multiple sclerosis)   depression and previous suicide attempts. History reviewed. No pertinent past surgical history.  Related Meds:     . antiseptic oral rinse  15 mL Mouth Rinse QID  . charcoal activated (NO SORBITOL)  50 g Oral Once  . chlorhexidine  15 mL Mouth Rinse BID  . dextrose 5 % and 0.45% NaCl   Intravenous STAT  . dextrose  25 mL Intravenous Once  . dextrose  50 mL Intravenous Once  . dextrose      . etomidate      . etomidate  30 mg Intravenous Once  . folic acid  1 mg Intravenous Daily  . heparin  5,000 Units Subcutaneous Q8H  . lidocaine (cardiac) 100 mg/71ml      . LORazepam  1 mg Intravenous Once  . LORazepam  2 mg Intravenous Once  . pantoprazole (PROTONIX) IV  40 mg Intravenous QHS  . PHENObarbital  2,000 mg Intravenous Once  . propofol      . rocuronium      . sodium bicarbonate  50 mEq Intravenous Once  . sodium chloride  2,000 mL Intravenous Once  . succinylcholine      . succinylcholine  100 mg Intravenous Once  . thiamine  100 mg Intravenous Daily  . DISCONTD: sodium chloride   Intravenous Once  . DISCONTD: PHENObarbital  20 mg/kg Intravenous Once     Ht: 5\' 10"  (177.8 cm)  Wt: 199 lb 8.3 oz (90.5 kg)  Ideal Wt: 75.5 kg % Ideal Wt: 120  Usual Wt: unknown % Usual Wt: unknown  Body mass index is 28.63 kg/(m^2).  Food/Nutrition Related Hx: unknown  Labs:  CMP     Component Value Date/Time   NA 140 10/29/2011 0455   K 3.5 10/29/2011 0455   CL 107 10/29/2011 0455   CO2 25 10/29/2011 0455   GLUCOSE 69* 10/29/2011 0455   BUN 10 10/29/2011 0455   CREATININE 0.92 10/29/2011 0455   CALCIUM 8.1* 10/29/2011 0455   PROT 5.7* 10/28/2011 2029   ALBUMIN 3.3* 10/28/2011 2029   AST 21 10/28/2011 2029   ALT 24 10/28/2011 2029   ALKPHOS 47 10/28/2011 2029   BILITOT 0.6 10/28/2011 2029   GFRNONAA >90 10/29/2011 0455   GFRAA >90 10/29/2011 0455    I/O last 3 completed shifts: In: 600 [I.V.:600] Out: 200 [Urine:200]     Diet Order: NPO  Supplements/Tube Feeding: none  IVF:    dextrose 5 % and 0.45% NaCl Last Rate: 75 mL/hr at 10/28/11 2300  propofol Last Rate: 35 mcg/kg/min (10/29/11 0930)  DISCONTD: sodium chloride   DISCONTD: propofol     Estimated Nutritional Needs:   Kcal: 1921 Protein: 100-110 Fluid: >1.8L Pt s/p overdose.   Probable aspiration of charcoal, NG tube with charcoal draining, ileus. Intubated.  Propofol at 19 ml/hr increased 3 hours ago currently providing 502 kcal per day.  NUTRITION DIAGNOSIS: -Inadequate oral intake (NI-2.1).  Status: Ongoing  RELATED TO: inability to take po   AS EVIDENCE BY: npo status  MONITORING/EVALUATION(Goals): Goal:  Tolerance of diet s/p extubation Monitor:  Plan of care, diet advancement, labs, weight, i/o  EDUCATION NEEDS: -No education needs identified at this time  INTERVENTION: If pt remains  extubated and bowel function returns then recommend TF with Jevity 1.2 at 20 ml/hr increasing 10 ml every  Dietitian #:[phone]  DOCUMENTATION CODES Per approved criteria  -Not Applicable    Jeoffrey Massed 10/29/2011, 11:35 AM

## 2011-10-29 NOTE — Clinical Documentation Improvement (Signed)
    CHANGE MENTAL STATUS DOCUMENTATION CLARIFICATION   THIS DOCUMENT IS NOT A PERMANENT PART OF THE MEDICAL RECORD  TO RESPOND TO THE THIS QUERY, FOLLOW THE INSTRUCTIONS BELOW:  1. If needed, update documentation for the patient's encounter via the notes activity.  2. Access this query again and click edit on the In [employer].  3. After updating, or not, click F2 to complete all highlighted (required) fields concerning your review. Select "additional documentation in the medical record" OR "no additional documentation provided".  4. Click Sign note button.  5. The deficiency will fall out of your In Basket *Please let us know if you are not able to complete this workflow by phone or e-mail (listed below).         10/29/11  Dear Dr. Carmin Muskrat Ramaswamy[facility]Associates  In an effort to better capture your patient's severity of illness, reflect appropriate length of stay and utilization of resources, a review of the patient medical record has revealed the following indicators.    Based on your clinical judgment, please clarify and document in a progress note and/or discharge summary the clinical condition associated with the following supporting information:  In responding to this query please exercise your independent judgment.  The fact that a query is asked, does not imply that any particular answer is desired or expected.   Pt admitted for Anticholinergic drug overdose and Alcohol intoxication.  According to Pn pt was delirious, confused and seizure witnessed by ER personal.    Please clarify with "delirium and confusion" can be further specified as one of the diagnosis listed below and document in pn or d/c summary.    Possible Clinical Conditions?  _______Encephalopathy (describe type if known)                       Anoxic                       Septic                       Alcoholic                        Hepatic                       Hypertensive                        Metabolic                       _______ Toxic  ___xxx____Drug induced Encephalopathy  _______Other Condition__________________ _______Cannot Clinically Determine   Supporting Information:  Risk Factors: Anticholinergic drug overdose, Resp failure, intubation, ingested 96 tablets of doxylamine, Alcohol intoxication.  Signs & Symptoms: relevant for multiple sclerosis, depression and previous suicidal attempt. Presents to the ED after ingesting 96 tablets of doxylamine Anticholinergic drug overdose As per the ER personal, the patient was confused, agitated, delirious and had a witnessed seizure. He required intubation for airway protection  Diagnostics: Lab: Resp CX in process   Treatment: Intubation sedated with propofol Monitoring  Reviewed: additional documentation in the medical record see above  Thank You,  Enis Slipper  RN, BSN, CCDS Clinical Documentation Specialist [facility] Dept Pager: [phone] / E-mail: [email or web address]  Health Information Management [place]

## 2011-10-29 NOTE — Progress Notes (Signed)
Per protocol 25 cc of D50 given IV for cbg of 67. Brett Canales Minor NP  Notified.Anthonette Legato

## 2011-10-29 NOTE — Progress Notes (Addendum)
Name: [name] MRN: [id number] DOB: Aug 01, 1955    LOS: 1  PULMONARY / CRITICAL CARE MEDICINE  HPI:   56 year old male with PMH relevant for multiple sclerosis, depression and previous suicidal attempt. Presents to the ED after ingesting 96 tablets of doxylamine. As per the ER personal, the patient was confused, agitated, delirious and had a witnessed seizure. He required intubation for airway protection. At the time of my examination the patient is intubated, sedated with propofol, calm, in no distress and hemodynamically stable. Patient unable to provide history. Information gathered from electronic records. .   Current Status: Intubated and sedated Vital Signs: Temp:  [97.3 F (36.3 C)-98.3 F (36.8 C)] 97.3 F (36.3 C) (07/08 0500) Pulse Rate:  [74-92] 83  (07/08 0430) Resp:  [17-28] 18  (07/08 0430) BP: (102-162)/(68-115) 112/71 mmHg (07/08 0430) SpO2:  [89 %-100 %] 97 % (07/08 0430) FiO2 (%):  [30 %-60 %] 30 % (07/08 0430) Weight:  [199 lb 8.3 oz (90.5 kg)-230 lb (104.327 kg)] 199 lb 8.3 oz (90.5 kg) (07/08 0500)  Physical Examination: General:  Intubated, mechanically ventilated, no acute distress Neuro:  Sedated, synchronous, nonfocal HEENT:  Dilated pupils, very poor reaction to light, pink conjunctivae. ET tube in place.  Neck:  Supple, no JVD   Cardiovascular:  RRR, no M/R/G Lungs:  CTA, no W/R/R Abdomen:  Soft, nontender, nondistended, diminished bowel sounds  Musculoskeletal: 1+ pedal edema Skin:  No rash   ASSESSMENT AND PLAN 56 year old male with MS and depression with previous suicidal attempts. Presents to the ED after ingesting 96 tablets of doxylamine. It has anti H1 and anticholinergic effects. At admission confused and agitated, then seized and was intubated for airway protection. EKG NSR with QRS . Chest X ray with no clear acute infiltrates.  No fever and normal WBC. No acute intracranial process on CT of the head. Urine drug screen is  negative.  PULMONARY  Lab 10/29/11 0438 10/28/11 2320 10/28/11 2021  PHART 7.391 7.364 7.333*  PCO2ART 41.6 38.1 40.8  PO2ART 83.0 113.0* 170.0*  HCO3 24.7* 21.2 21.1  O2SAT 96.4 98.1 98.7   Ventilator Settings: Vent Mode:  [-] PRVC FiO2 (%):  [30 %-60 %] 30 % Set Rate:  [18 bmp] 18 bmp Vt Set:  [500 mL] 500 mL PEEP:  [5 cmH20] 5 cmH20 Plateau Pressure:  [15 cmH20-21 cmH20] 15 cmH20 CXR:  . Ct Head Wo Contrast  10/28/2011  *RADIOLOGY REPORT*  Clinical Data: Altered mental status.  CT HEAD WITHOUT CONTRAST  Technique:  Contiguous axial images were obtained from the base of the skull through the vertex without contrast.  Comparison: None.  Findings: Motion artifact was present on the initial scan.  These slices were repeated. The study is diagnostic.  Periventricular white matter disease is present compatible with clinical history of multiple sclerosis. No mass lesion, mass effect, midline shift, hydrocephalus, hemorrhage.  No territorial ischemia or acute infarction.  Mild atrophy.  Paranasal sinuses appear within normal limits.  Calvarium intact.  IMPRESSION:  1.  No acute intracranial abnormality. 2.  Periventricular white matter lesions compatible with multiple sclerosis.  Original Report Authenticated By: Andreas Newport, M.D.   Dg Chest Portable 1 View  10/28/2011  *RADIOLOGY REPORT*  Clinical Data: Endotracheal tube placement.  Drug overdose.  PORTABLE CHEST - 1 VIEW  Comparison: None.  Findings: Endotracheal tube is 39 mm from the carina.  Enteric tube is present with proximal side port in the stomach.  Low lung  volumes are present with basilar atelectasis.  Cardiopericardial silhouette appears within normal limits.  IMPRESSION:  1.  Support apparatus in good position. 2.  Low volume chest with basilar atelectasis.  Original Report Authenticated By: Andreas Newport, M.D.    ETT:  7/7>>.  A:   1) Respiratory failure due to inability to protect his airway.  P:   - Mechanical  ventilation  - VAP prevention order set.  - Proventil PRN for wheezing  - Daily awakening and SBT in am if indicated.   CARDIOVASCULAR  Lab 10/29/11 0455 10/28/11 2058 10/28/11 2029  TROPONINI <0.30 -- <0.30  LATICACIDVEN -- 3.0* --  PROBNP -- -- 346.6*   ECG:  NSR  Lines: two 18 peripherals  A:  1) Hemodynamically stable 2) EGG with widened QRS  P:  - ICU monitoring - EKG q4 hrs - Bicarbonate boluses for QRS >  RENAL  Lab 10/29/11 0455 10/28/11 2029 10/28/11 1749  NA 140 138 139  K 3.5 3.4* --  CL 107 104 102  CO2 25 20 25   BUN 10 11 12   CREATININE 0.92 0.88 0.96  CALCIUM 8.1* 8.3* 9.4  MG 1.5 1.5 --  PHOS 3.5 3.5 --   Intake/Output      07/07 0701 - 07/08 0700 07/08 0701 - 07/09 0700   I.V. (mL/kg) 600 (6.6)    Total Intake(mL/kg) 600 (6.6)    Urine (mL/kg/hr) 200 (0.1)    Total Output 200    Net +400          Foley:  Placed 10/28/11  A:   1) Normal creatinine P:   - Will monitor BMP and CK  GASTROINTESTINAL  Lab 10/28/11 2029 10/28/11 1749  AST 21 26  ALT 24 30  ALKPHOS 47 58  BILITOT 0.6 0.4  PROT 5.7* 7.2  ALBUMIN 3.3* 4.0    A:   1) Ileus likely secondary to anticholinergic effect. +vomiting activated charcoal P:   - OG tube in place-placed to suction 7/8 - GI prophylaxis with IV protonix  HEMATOLOGIC  Lab 10/29/11 0455 10/28/11 2029 10/28/11 1749  HGB 12.4* 12.2* 14.4  HCT 36.2* 35.9* 41.4  PLT 172 174 198  INR -- 0.96 --  APTT -- 26 --   A:   No issues P:  - Will monitor CBC  INFECTIOUS  Lab 10/29/11 0455 10/28/11 2029 10/28/11 1749  WBC 7.8 5.9 7.7  PROCALCITON -- <0.10 --   Cultures: 7/7 bcx2>> 7/8 sputum>>  Antibiotics: No antibiotics for now  A:   1) No evidence of infectious process P:   - We will follow cultures and start antibiotics if any evidence of infection or hemodynamic instability.  ENDOCRINE  Lab 10/29/11 0009  GLUCAP 84   A:  No issues  P:   _ POCT glucose q8  hrs  NEUROLOGIC  A:   1) Anticholinergic drug overdose 2) Seizure 3) Urine drug screen negative.  4) Alcohol intoxication  P:   - No role for physostigmine, patient intubated and hemodynamically stable. - Continuous sedation with propofol - Pain control with PRN fentanyl - Acetaminophen level sent. <15 -7/8 add folic acid and thiamine  BEST PRACTICE / DISPOSITION - Level of Care:  ICU - Primary Service:  PCCM - Consultants:  None - Code Status:  Full - Diet:  NPO - DVT Px:  Heparin - GI Px:  Protonix - Skin Integrity:  Intact - Social / Family:  No family available.  Brett Canales Minor ACNP Conley Rolls  [place] Pager [phone] till 3 pm If no answer page [phone] 10/29/2011, 8:06 AM  Attending: I have seen and examined the patient with nurse practitioner/resident and agree with and have modified the note above.   Yolonda Kida PCCM Pager: [phone] Cell: [phone] If no response, call [phone]

## 2011-10-29 NOTE — Progress Notes (Signed)
eLink Physician-Brief Progress Note Patient Name: Jlen Wintle DOB: 1956-01-10 MRN: [id number]  Date of Service  10/29/2011   HPI/Events of Note   Per poison control, does not need EKg q 4h  eICU Interventions     Intervention Category Minor Interventions: Routine modifications to care plan (e.g. PRN medications for pain, fever)  ALVA,RAKESH V. 10/29/2011, 5:03 PM

## 2011-10-30 ENCOUNTER — Inpatient Hospital Stay (HOSPITAL_COMMUNITY): Payer: Medicare Other

## 2011-10-30 LAB — BASIC METABOLIC PANEL
BUN: 8 mg/dL (ref 6–23)
CO2: 25 mEq/L (ref 19–32)
Calcium: 8 mg/dL — ABNORMAL LOW (ref 8.4–10.5)
Chloride: 107 mEq/L (ref 96–112)
Creatinine, Ser: 0.86 mg/dL (ref 0.50–1.35)
GFR calc Af Amer: >90 mL/min (ref >90)
GFR calc non Af Amer: >90 mL/min (ref >90)
Glucose, Bld: 96 mg/dL (ref 70–99)
Potassium: 2.9 mEq/L — ABNORMAL LOW (ref 3.5–5.1)
Sodium: 138 mEq/L (ref 135–145)

## 2011-10-30 LAB — CBC
HCT: 34.2 % — ABNORMAL LOW (ref 39.0–52.0)
Hemoglobin: 11.4 g/dL — ABNORMAL LOW (ref 13.0–17.0)
MCH: 29.7 pg (ref 26.0–34.0)
MCHC: 33.3 g/dL (ref 30.0–36.0)
MCV: 89.1 fL (ref 78.0–100.0)
Platelets: 147 10*3/uL — ABNORMAL LOW (ref 150–400)
RBC: 3.84 MIL/uL — ABNORMAL LOW (ref 4.22–5.81)
RDW: 15.6 % — ABNORMAL HIGH (ref 11.5–15.5)
WBC: 6.1 10*3/uL (ref 4.0–10.5)

## 2011-10-30 LAB — BLOOD GAS, ARTERIAL
Acid-Base Excess: 2.2 mmol/L — ABNORMAL HIGH (ref 0.0–2.0)
Bicarbonate: 25.4 mEq/L — ABNORMAL HIGH (ref 20.0–24.0)
Drawn by: 317871
FIO2: 0.3 %
MECHVT: 0.5 mL
O2 Saturation: 96.6 %
PEEP: 5 cmH2O
Patient temperature: 98.6
RATE: 18 resp/min
TCO2: 22.8 mmol/L (ref 0–100)
pCO2 arterial: 36 mmHg (ref 35.0–45.0)
pH, Arterial: 7.463 — ABNORMAL HIGH (ref 7.350–7.450)
pO2, Arterial: 78.5 mmHg — ABNORMAL LOW (ref 80.0–100.0)

## 2011-10-30 LAB — PHOSPHORUS: Phosphorus: 2.7 mg/dL (ref 2.3–4.6)

## 2011-10-30 LAB — GLUCOSE, CAPILLARY
Glucose-Capillary: 85 mg/dL (ref 70–99)
Glucose-Capillary: 92 mg/dL (ref 70–99)
Glucose-Capillary: 99 mg/dL (ref 70–99)
Glucose-Capillary: 99 mg/dL (ref 70–99)
Glucose-Capillary: 102 mg/dL — ABNORMAL HIGH (ref 70–99)
Glucose-Capillary: 141 mg/dL — ABNORMAL HIGH (ref 70–99)

## 2011-10-30 LAB — MAGNESIUM: Magnesium: 1.6 mg/dL (ref 1.5–2.5)

## 2011-10-30 MED ORDER — POTASSIUM CHLORIDE 10 MEQ/100ML IV SOLN
INTRAVENOUS | Status: AC
  Administered 2011-10-30: 10 meq via INTRAVENOUS
  Filled 2011-10-30: qty 100

## 2011-10-30 MED ORDER — POTASSIUM CHLORIDE 20 MEQ/15ML (10%) PO LIQD: 40.0000 meq | ORAL | Status: DC

## 2011-10-30 MED ORDER — POTASSIUM CHLORIDE 10 MEQ/100ML IV SOLN
10.0000 meq | INTRAVENOUS | Status: AC
  Administered 2011-10-30 (×6): 10 meq via INTRAVENOUS
  Filled 2011-10-30 (×5): qty 100

## 2011-10-30 NOTE — Progress Notes (Signed)
eLink Physician-Brief Progress Note Patient Name: [name] DOB: 1955-07-28 MRN: [id number]  Date of Service  10/30/2011   HPI/Events of Note   hypokalemia  eICU Interventions  Potassium replaced   Intervention Category Intermediate Interventions: Electrolyte abnormality - evaluation and management  DETERDING,ELIZABETH 10/30/2011, 5:21 AM

## 2011-10-30 NOTE — Progress Notes (Addendum)
CLINICAL SOCIAL WORK PSYCHIATRY SERVICE LINE ASSESSMENT 10/30/2011   Patient:  [name] [facility]  Account:  [id number]  Admit Date:  10/28/2011   Clinical Social Worker:  Doroteo Glassman  Date/Time:  10/30/2011 01:51 PM Referred by:  Physician  Date referred:  10/30/2011 Reason for Referral   Behavioral Health Issues      Presenting Symptoms/Problems (In the person's/family's own words):   Pt OD'd on 96 tablets of doxylamine.        Abuse/Neglect/Trauma Comments:    Psychiatric History (check all that apply)   Inpatient/hospitilization   Outpatient treatment      Psychiatric medications:   Celexa, Ambien and Abilify      Current Mental Health Hospitalizations/Previous Mental Health History:    Pt reports that he's been hospitalized at [facility] 15-20 times and that he's been to [facility] 3 times.      Pt has 2 outpt appointments this month at [facility].      Current provider:    Vickii Penna and Date:    Current Medications:    See H&P      Previous Impatient Admission/Date/Reason:    Pt reports that he's been hospitalized at [facility] 15-20 times and that he's been to [facility] 3 times.      Emotional Health / Current Symptoms       Suicide/Self Harm   Suicidal ideation (ex: "I can't take any more,I wish I could disappear")   Suicide attempt in past (date/description)      Suicide attempt in the past:    Pt reports that he's been hospitalized at [facility] 15-20 times and that he's been to [facility] 3 times.      Pt reports, "I can't take it anymore.  I'm ready to give up."      Other harmful behavior:    Psychotic/Dissociative Symptoms   None reported      Other Psychotic/Dissociative Symptoms:      Attention/Behavioral Symptoms   Within Normal Limits      Other Attention / Behavioral Symptoms:       Other Cognitive Impairment:      Mood and Adjustment   DEPRESSION   Flat         Stress, Anxiety, Trauma, Any Recent Loss/Stressor   None reported        Anxiety (frequency):    Phobia (specify):    Compulsive behavior (specify):    Obsessive behavior (specify):    Other:    Pt reports that he has no stress in his life.  He reports that he has a 4 year old son whom he has a good relationship with and sees regularly.        SBIRT completed (please refer for detailed history):     Self-reported substance use:    Urinary Drug Screen Completed:  Y Alcohol level:    91         Environmental/Housing/Living Arrangement   Stable housing      Who is in the home:    Group Home      Emergency contact:   Jinny Sanders, [employer] Director       Financial   Private Insurance      Patient's Strengths and Goals (patient's own words):    Clinical Social Worker's Interpretive Summary:    Pt reports frustration with himself, as he states, "I don't know why I keep doing this."      Pt denies that he has any stress  in his life contributing to his depression and states that there's no reason for him to keep attempting to end his life.      Pt reports that he's been to [facility] 15-20 times due to SI and states that he was there most recently in June.      Pt reports that he's been to [facility] 3x and that no stint there was effective, as he spent most of his time there wishing that he wasn't.      Pt states that the only effective tx he's received was at an [facility] called [facility].  He stated that Mrs. Inis [facility] Group Home director, stopped her residents from attending due to feeling that some of her residents weren't being treated well.  Pt stated that he felt that he was treated just fine and that he'd like to explore going back.      Pt ended the ax by stating, "This is no way to live.  I can't take it anymore.  I'm ready to give up."  Pt expressed his frustration with having be inpt numerous times and not having any relief from his depression.      Disposition:  Recommend Psych CSW  continuing to support while in hospital.   CSW to continue to follow.   Providence Crosby, LCSWA Clinical Social Work [phone]

## 2011-10-30 NOTE — Procedures (Signed)
Extubation Procedure Note  Patient Details:   Name: [name] DOB: 1955/10/01 MRN: [id number]   Airway Documentation:  AIRWAYS 8 mm (Active)  Secured at (cm) 24 cm 10/29/2011  8:00 AM  Measured From Lips 10/29/2011  8:00 AM  Secured By [employer] 10/29/2011  8:00 AM     Airway (Active)  Secured at (cm) 24 cm 10/30/2011  8:03 AM  Measured From Lips 10/30/2011  8:03 AM  Secured Location Center 10/30/2011  4:15 AM  Secured By [employer] 10/30/2011  8:03 AM  Tube Holder Repositioned Yes 10/30/2011  8:03 AM  Cuff Pressure (cm H2O) 22 cm H2O 10/30/2011  8:03 AM    Evaluation  O2 sats: stable throughout Complications: No apparent complications Patient did tolerate procedure well. Bilateral Breath Sounds: Coarse crackles;Diminished Suctioning: Airway Yes  Dairl Ponder Nannette 10/30/2011, 10:46 AM

## 2011-10-30 NOTE — Procedures (Signed)
Extubation Procedure Note  Patient Details:   Name: [name] DOB: Apr 23, 1956 MRN: [id number]   Airway Documentation:  AIRWAYS 8 mm (Active)  Secured at (cm) 24 cm 10/29/2011  8:00 AM  Measured From Lips 10/29/2011  8:00 AM  Secured By [employer] 10/29/2011  8:00 AM     Airway (Active)  Secured at (cm) 24 cm 10/30/2011  8:03 AM  Measured From Lips 10/30/2011  8:03 AM  Secured Location Center 10/30/2011  4:15 AM  Secured By [employer] 10/30/2011  8:03 AM  Tube Holder Repositioned Yes 10/30/2011  8:03 AM  Cuff Pressure (cm H2O) 22 cm H2O 10/30/2011  8:03 AM    Evaluation  O2 sats: stable throughout Complications: No apparent complications Patient did tolerate procedure well. Bilateral Breath Sounds: Coarse crackles;Diminished Suctioning: Airway Yes  Dairl Ponder Nannette 10/30/2011, 11:01 AM

## 2011-10-30 NOTE — H&P (Deleted)
Clinical Social Work Department CLINICAL SOCIAL WORK PSYCHIATRY SERVICE LINE ASSESSMENT 10/30/2011  Patient:  [name] [facility]  Account:  [id number]  Admit Date:  10/28/2011  Clinical Social Worker:  Doroteo Glassman  Date/Time:  10/30/2011 01:51 PM Referred by:  Physician  Date referred:  10/30/2011 Reason for Referral  Behavioral Health Issues   Presenting Symptoms/Problems (In the person's/family's own words):   Pt OD'd on 96 tablets of doxylamine.    Abuse/Neglect/Trauma Comments:   Psychiatric History (check all that apply)  Inpatient/hospitilization  Outpatient treatment   Psychiatric medications:  Celexa, Ambien and Abilify   Current Mental Health Hospitalizations/Previous Mental Health History:   Pt reports that he's been hospitalized at [facility] 15-20 times and that he's been to [facility] 3 times.    Pt has 2 outpt appointments this month at [facility].   Current provider:   Vickii Penna and Date:   Current Medications:   See H&P   Previous Impatient Admission/Date/Reason:   Pt reports that he's been hospitalized at [facility] 15-20 times and that he's been to [facility] 3 times.   Emotional Health / Current Symptoms    Suicide/Self Harm  Suicidal ideation (ex: "I can't take any more,I wish I could disappear")  Suicide attempt in past (date/description)   Suicide attempt in the past:   Pt reports that he's been hospitalized at [facility] 15-20 times and that he's been to [facility] 3 times.    Pt reports, "I can't take it anymore.  I'm ready to give up."   Other harmful behavior:   Psychotic/Dissociative Symptoms  None reported   Other Psychotic/Dissociative Symptoms:    Attention/Behavioral Symptoms  Within Normal Limits   Other Attention / Behavioral Symptoms:     Other Cognitive Impairment:    Mood and Adjustment  DEPRESSION  Flat    Stress, Anxiety, Trauma, Any Recent Loss/Stressor  None reported   Anxiety (frequency):   Phobia (specify):   Compulsive behavior  (specify):   Obsessive behavior (specify):   Other:   Pt reports that he has no stress in his life.  He reports that he has a 60 year old son whom he has a good relationship with and sees regularly.    SBIRT completed (please refer for detailed history):    Self-reported substance use:   Urinary Drug Screen Completed:  Y Alcohol level:   91    Environmental/Housing/Living Arrangement  Stable housing   Who is in the home:   Group Home   Emergency contact:  Jinny Sanders, [employer] [facility] Director   Financial  Private Insurance   Patient's Strengths and Goals (patient's own words):   Clinical Social Worker's Interpretive Summary:   Pt reports frustration with himself, as he states, "I don't know why I keep doing this."    Pt denies that he has any stress in his life contributing to his depression and states that there's no reason for him to keep attempting to end his life.    Pt reports that he's been to [facility] 15-20 times due to SI and states that he was there most recently in June.    Pt reports that he's been to [facility] 3x and that no stint there was effective, as he spent most of his time there wishing that he wasn't.    Pt states that the only effective tx he's received was at an Adult [facility] called [facility] on [facility].  He stated that Mrs. [facility] Group [facility] director, stopped  her residents from attending due to feeling that some of her residents weren't being treated well.  Pt stated that he felt that he was treated just fine and that he'd like to explore going back.    Pt ended the ax by stating, "This is no way to live.  I can't take it anymore.  I'm ready to give up."  Pt expressed his frustration with having be inpt numerous times and not having any relief from his depression.   Disposition:  Recommend Psych CSW continuing to support while in hospital.  CSW to continue to follow.  Providence Crosby, LCSWA Clinical Social Work [phone]

## 2011-10-30 NOTE — Procedures (Signed)
Extubation Procedure Note  Patient Details:   Name: Clair Alfieri DOB: [birth year] MRN: [id number]   Airway Documentation:  AIRWAYS 8 mm (Active)  Secured at (cm) 24 cm 10/29/2011  8:00 AM  Measured From Lips 10/29/2011  8:00 AM  Secured By [employer] 10/29/2011  8:00 AM     Airway (Active)  Secured at (cm) 24 cm 10/30/2011  8:03 AM  Measured From Lips 10/30/2011  8:03 AM  Secured Location Center 10/30/2011  4:15 AM  Secured By [employer] 10/30/2011  8:03 AM  Tube Holder Repositioned Yes 10/30/2011  8:03 AM  Cuff Pressure (cm H2O) 22 cm H2O 10/30/2011  8:03 AM    Evaluation  O2 sats: stable throughout Complications: No apparent complications Patient did tolerate procedure well. Bilateral Breath Sounds: Coarse crackles;Diminished Suctioning: Airway Yes    Dairl Ponder Nannette 10/30/2011, 10:50 AM

## 2011-10-30 NOTE — Procedures (Signed)
Extubation Procedure Note  Patient Details:   Name: [name] DOB: 04-06-1956 MRN: [id number]   Airway Documentation:  AIRWAYS 8 mm (Active)  Secured at (cm) 24 cm 10/29/2011  8:00 AM  Measured From Lips 10/29/2011  8:00 AM  Secured By [employer] 10/29/2011  8:00 AM     Airway (Active)  Secured at (cm) 24 cm 10/30/2011  8:03 AM  Measured From Lips 10/30/2011  8:03 AM  Secured Location Center 10/30/2011  4:15 AM  Secured By [employer] 10/30/2011  8:03 AM  Tube Holder Repositioned Yes 10/30/2011  8:03 AM  Cuff Pressure (cm H2O) 22 cm H2O 10/30/2011  8:03 AM    Evaluation  O2 sats: stable throughout Complications: No apparent complications Patient did tolerate procedure well. Bilateral Breath Sounds: Coarse crackles;Diminished Suctioning: Airway Yes  Dairl Ponder Nannette 10/30/2011, 10:55 AM

## 2011-10-30 NOTE — Procedures (Signed)
Extubation Procedure Note  Patient Details:   Name: Alec Jaros DOB: January 13, 1956 MRN: [id number]   Airway Documentation:  AIRWAYS 8 mm (Active)  Secured at (cm) 24 cm 10/29/2011  8:00 AM  Measured From Lips 10/29/2011  8:00 AM  Secured By [employer] 10/29/2011  8:00 AM     Airway (Active)  Secured at (cm) 24 cm 10/30/2011  8:03 AM  Measured From Lips 10/30/2011  8:03 AM  Secured Location Center 10/30/2011  4:15 AM  Secured By [employer] 10/30/2011  8:03 AM  Tube Holder Repositioned Yes 10/30/2011  8:03 AM  Cuff Pressure (cm H2O) 22 cm H2O 10/30/2011  8:03 AM    Evaluation  O2 sats: stable throughout Complications: No apparent complications Patient did tolerate procedure well. Bilateral Breath Sounds: Coarse crackles;Diminished Suctioning: Airway Yes  Dairl Ponder Nannette 10/30/2011, 10:49 AM

## 2011-10-30 NOTE — Progress Notes (Signed)
eLink Physician-Brief Progress Note Patient Name: Tkai Serfass DOB: Apr 07, 1956 MRN: [id number]  Date of Service  10/30/2011   HPI/Events of Note   GPC 1/2  eICU Interventions  Clinically improved, no WC, fever Hold off abx until speciated   Intervention Category Intermediate Interventions: Diagnostic test evaluation  ALVA,RAKESH V. 10/30/2011, 6:08 PM

## 2011-10-30 NOTE — Progress Notes (Signed)
Name: [name] MRN: [id number] DOB: [birth year]    LOS: 2  PULMONARY / CRITICAL CARE MEDICINE  HPI:   56 year old male with PMH relevant for multiple sclerosis, depression and previous suicidal attempt. Presents to the ED after ingesting 96 tablets of doxylamine. As per the ER personal, the patient was confused, agitated, delirious and had a witnessed seizure. He required intubation for airway protection. At the time of my examination the patient is intubated, sedated with propofol, calm, in no distress and hemodynamically stable. Patient unable to provide history. Information gathered from electronic records. .   Current Status: Intubated and sedated but more awake Vital Signs: Temp:  [97.5 F (36.4 C)-100.3 F (37.9 C)] 97.5 F (36.4 C) (07/09 0800) Pulse Rate:  [69-88] 81  (07/09 0700) Resp:  [18-23] 19  (07/09 0700) BP: (112-139)/(62-80) 139/78 mmHg (07/09 0700) SpO2:  [93 %-100 %] 100 % (07/09 0700) FiO2 (%):  [30 %] 30 % (07/09 0803) Weight:  [201 lb 11.5 oz (91.5 kg)] 201 lb 11.5 oz (91.5 kg) (07/09 0014)  Physical Examination: General:  Intubated, mechanically ventilated, no acute distress Neuro:  Sedated, synchronous, nonfocal, agitated when npt dedated HEENT:  . ET tube in place.  Neck:  Supple, no JVD   Cardiovascular:  RRR, no M/R/G Lungs:  CTA, no W/R/R Abdomen:  Soft, nontender, nondistended, diminished bowel sounds  Musculoskeletal: 1+ pedal edema Skin:  No rash   ASSESSMENT AND PLAN 56 year old male with MS and depression with previous suicidal attempts. Presents to the ED after ingesting 96 tablets of doxylamine. It has anti H1 and anticholinergic effects. At admission confused and agitated, then seized and was intubated for airway protection. EKG NSR with QRS . Chest X ray with no clear acute infiltrates.  No fever and normal WBC. No acute intracranial process on CT of the head. Urine drug screen is negative.  PULMONARY  Lab 10/30/11 0540  10/29/11 0438 10/28/11 2320 10/28/11 2021  PHART 7.463* 7.391 7.364 7.333*  PCO2ART 36.0 41.6 38.1 40.8  PO2ART 78.5* 83.0 113.0* 170.0*  HCO3 25.4* 24.7* 21.2 21.1  O2SAT 96.6 96.4 98.1 98.7   Ventilator Settings: Vent Mode:  [-] PRVC FiO2 (%):  [30 %] 30 % Set Rate:  [18 bmp] 18 bmp Vt Set:  [500 mL] 500 mL PEEP:  [5 cmH20] 5 cmH20 Plateau Pressure:  [13 cmH20-16 cmH20] 15 cmH20 CXR:  . Ct Head Wo Contrast  10/28/2011  *RADIOLOGY REPORT*  Clinical Data: Altered mental status.  CT HEAD WITHOUT CONTRAST  Technique:  Contiguous axial images were obtained from the base of the skull through the vertex without contrast.  Comparison: None.  Findings: Motion artifact was present on the initial scan.  These slices were repeated. The study is diagnostic.  Periventricular white matter disease is present compatible with clinical history of multiple sclerosis. No mass lesion, mass effect, midline shift, hydrocephalus, hemorrhage.  No territorial ischemia or acute infarction.  Mild atrophy.  Paranasal sinuses appear within normal limits.  Calvarium intact.  IMPRESSION:  1.  No acute intracranial abnormality. 2.  Periventricular white matter lesions compatible with multiple sclerosis.  Original Report Authenticated By: Andreas Newport, M.D.   Dg Chest Port 1 View  10/30/2011  *RADIOLOGY REPORT*  Clinical Data: Ventilated patient.  PORTABLE CHEST - 1 VIEW  Comparison: 10/28/2011.  Findings: Endotracheal tube and enteric tube are present. Proximal side port the enteric tube is near the gastroesophageal junction and the tube could be advanced a few  cm for better positioning into prevent gastroesophageal reflux.  Increased bibasilar density is present, likely representing atelectasis.  No focal consolidation. Airspace disease at the bases is difficult to exclude.  Attention on follow-up recommended. Cardiopericardial silhouette unchanged.  IMPRESSION: Unchanged endotracheal tube. Minimal retraction of enteric tube  with proximal side port at the gastroesophageal junction.  This tube could be advanced a few centimeters for more secure positioning within the stomach.  Worsening basilar aeration, likely atelectasis.  Original Report Authenticated By: Andreas Newport, M.D.   Dg Chest Portable 1 View  10/28/2011  *RADIOLOGY REPORT*  Clinical Data: Endotracheal tube placement.  Drug overdose.  PORTABLE CHEST - 1 VIEW  Comparison: None.  Findings: Endotracheal tube is 39 mm from the carina.  Enteric tube is present with proximal side port in the stomach.  Low lung volumes are present with basilar atelectasis.  Cardiopericardial silhouette appears within normal limits.  IMPRESSION:  1.  Support apparatus in good position. 2.  Low volume chest with basilar atelectasis.  Original Report Authenticated By: Andreas Newport, M.D.    ETT:  7/7>>.  A:   1) Respiratory failure due to inability to protect his airway.  P:   - Mechanical ventilation  - VAP prevention order set.  - Proventil PRN for wheezing  - Daily awakening and SBT in am if indicated. -7/9 attempt extubation   CARDIOVASCULAR  Lab 10/29/11 1218 10/29/11 0455 10/28/11 2058 10/28/11 2029  TROPONINI <0.30 <0.30 -- <0.30  LATICACIDVEN -- -- 3.0* --  PROBNP -- -- -- 346.6*   ECG:  NSR  Lines: two 18 peripherals  A:  1) Hemodynamically stable 2) EGG with widened QRS  P:  - ICU monitoring - EKG q4 hrs   RENAL  Lab 10/30/11 0320 10/29/11 0455 10/28/11 2029 10/28/11 1749  NA 138 140 138 139  K 2.9* 3.5 -- --  CL 107 107 104 102  CO2 25 25 20 25   BUN 8 10 11 12   CREATININE 0.86 0.92 0.88 0.96  CALCIUM 8.0* 8.1* 8.3* 9.4  MG 1.6 1.5 1.5 --  PHOS 2.7 3.5 3.5 --   Intake/Output      07/08 0701 - 07/09 0700 07/09 0701 - 07/10 0700   I.V. (mL/kg) 2502.8 (27.4) 27.2 (0.3)   NG/GT 50    IV Piggyback 100 100   Total Intake(mL/kg) 2652.8 (29) 127.2 (1.4)   Urine (mL/kg/hr) 1160 (0.5)    Emesis/NG output 1100    Total Output 2260    Net +392.8  +127.2         Foley:  Placed 10/28/11  A:   1) Normal creatinine P:   - Will monitor BMP and CK  GASTROINTESTINAL  Lab 10/28/11 2029 10/28/11 1749  AST 21 26  ALT 24 30  ALKPHOS 47 58  BILITOT 0.6 0.4  PROT 5.7* 7.2  ALBUMIN 3.3* 4.0    A:   1) Ileus likely secondary to anticholinergic effect. +vomiting activated charcoal P:   - OG tube in place-placed to suction 7/8 - GI prophylaxis with IV protonix  HEMATOLOGIC  Lab 10/30/11 0320 10/29/11 0455 10/28/11 2029 10/28/11 1749  HGB 11.4* 12.4* 12.2* 14.4  HCT 34.2* 36.2* 35.9* 41.4  PLT 147* 172 174 198  INR -- -- 0.96 --  APTT -- -- 26 --   A:   No issues P:  - Will monitor CBC  INFECTIOUS  Lab 10/30/11 0320 10/29/11 0455 10/28/11 2029 10/28/11 1749  WBC 6.1 7.8 5.9 7.7  PROCALCITON -- -- <  0.10 --   Cultures: 7/7 bcx2>> 7/8 sputum>>  Antibiotics: No antibiotics for now  A:   1) No evidence of infectious process P:   - We will follow cultures and start antibiotics if any evidence of infection or hemodynamic instability.  ENDOCRINE  Lab 10/30/11 0759 10/30/11 0435 10/29/11 2345 10/29/11 1941 10/29/11 1529  GLUCAP 99 85 92 89 81   A:  No issues  P:   _ POCT glucose q8 hrs  NEUROLOGIC  A:   1) Anticholinergic drug overdose 2) Seizure 3) Urine drug screen negative.  4) Alcohol intoxication  P:   - No role for physostigmine, patient intubated and hemodynamically stable. - Continuous sedation with propofol as needed - Pain control with PRN fentanyl - Acetaminophen level sent. <15 -7/8 add folic acid and thiamine 7/9 Will need psych consult when extubated BEST PRACTICE / DISPOSITION - Level of Care:  ICU - Primary Service:  PCCM - Consultants:  None - Code Status:  Full - Diet:  NPO if not extubated 7/9 will start tube feeds. - DVT Px:  Heparin - GI Px:  Protonix - Skin Integrity:  Intact - Social / Family:  No family available.  Brett Canales Minor ACNP Adolph Pollack PCCM Pager [phone] till 3  pm If no answer page [phone] 10/30/2011, 9:56 AM   Billy Fischer, MD ; [facility] service Mobile [phone].  After 5:30 PM or weekends, call [phone]

## 2011-10-30 NOTE — Progress Notes (Signed)
CRITICAL VALUE ALERT  Critical value received: Gm +cocci in blood aerobic sample from 10/28/11  Date of notification: 10/30/11  Time of notification: 1800  Critical value read back:yes  Nurse who received alert:  Anthonette Legato  MD notified (1st page): Dr. Vassie Loll  Time of first page: 1805  MD notified (2nd page):  Time of second page:  Responding MD:  Dr. Vassie Loll  Time MD responded:1805

## 2011-10-31 ENCOUNTER — Inpatient Hospital Stay (HOSPITAL_COMMUNITY): Payer: Medicare Other

## 2011-10-31 DIAGNOSIS — R45851 Suicidal ideations: Secondary | ICD-10-CM

## 2011-10-31 DIAGNOSIS — G35 Multiple sclerosis: Secondary | ICD-10-CM | POA: Diagnosis present

## 2011-10-31 LAB — CBC
HCT: 34.5 % — ABNORMAL LOW (ref 39.0–52.0)
Hemoglobin: 11.3 g/dL — ABNORMAL LOW (ref 13.0–17.0)
MCH: 29.1 pg (ref 26.0–34.0)
MCHC: 32.8 g/dL (ref 30.0–36.0)
MCV: 88.9 fL (ref 78.0–100.0)
Platelets: 147 10*3/uL — ABNORMAL LOW (ref 150–400)
RBC: 3.88 MIL/uL — ABNORMAL LOW (ref 4.22–5.81)
RDW: 15.3 % (ref 11.5–15.5)
WBC: 5.9 10*3/uL (ref 4.0–10.5)

## 2011-10-31 LAB — CULTURE, RESPIRATORY W GRAM STAIN

## 2011-10-31 LAB — GLUCOSE, CAPILLARY
Comment 1: 100066906
Comment 1: 100066906
Glucose-Capillary: 105 mg/dL — ABNORMAL HIGH (ref 70–99)
Glucose-Capillary: 158 mg/dL — ABNORMAL HIGH (ref 70–99)
Glucose-Capillary: 91 mg/dL (ref 70–99)
Glucose-Capillary: 92 mg/dL (ref 70–99)

## 2011-10-31 LAB — BASIC METABOLIC PANEL
BUN: 8 mg/dL (ref 6–23)
CO2: 25 mEq/L (ref 19–32)
Calcium: 8.1 mg/dL — ABNORMAL LOW (ref 8.4–10.5)
Chloride: 106 mEq/L (ref 96–112)
Creatinine, Ser: 0.79 mg/dL (ref 0.50–1.35)
GFR calc Af Amer: >90 mL/min (ref >90)
GFR calc non Af Amer: >90 mL/min (ref >90)
Glucose, Bld: 97 mg/dL (ref 70–99)
Potassium: 3.6 mEq/L (ref 3.5–5.1)
Sodium: 138 mEq/L (ref 135–145)

## 2011-10-31 MED ORDER — BIOTENE DRY MOUTH MT LIQD
15.0000 mL | Freq: Two times a day (BID) | OROMUCOSAL | Status: DC
  Administered 2011-10-31 – 2011-11-02 (×5): 15 mL via OROMUCOSAL

## 2011-10-31 NOTE — Progress Notes (Signed)
Name: [name] MRN: [id number] DOB: 1955/05/20    LOS: 3  PULMONARY / CRITICAL CARE MEDICINE  HPI:   56 year old male with PMH relevant for multiple sclerosis, depression and previous suicidal attempt. Presented 7/07 to the ED after ingesting 96 tablets of doxylamine. As per the ER personal, the patient was confused, agitated, delirious and had a witnessed seizure. He required intubation for airway protection. Never hemodynamically unstable. PCCM admitted  ETT:  7/7 >> 7/9 Cultures: 7/7 bcx2>> 7/8 sputum>>  Antibiotics: None  Current Status: RASS 0. CAM-ICU negative. No distress. No complaints. Has tolerated extubation well  Vital Signs: Temp:  [98.5 F (36.9 C)-99.8 F (37.7 C)] 98.5 F (36.9 C) (07/10 0600) Pulse Rate:  [65-88] 87  (07/10 0800) Resp:  [15-24] 17  (07/10 0800) BP: (100-141)/(53-89) 107/67 mmHg (07/10 0800) SpO2:  [93 %-99 %] 95 % (07/10 0800) FiO2 (%):  [30 %] 30 % (07/09 1000)  Physical Examination: General:  No distress Neuro:  No focal deficits HEENT:  WNL  Neck:  Supple, no JVD   Cardiovascular:  RRR, no M/R/G Lungs:  CTA, no W/R/R Abdomen:  Soft, nontender, NABS  Musculoskeletal: no edema  BMET    Component Value Date/Time   NA 138 10/31/2011 0315   K 3.6 10/31/2011 0315   CL 106 10/31/2011 0315   CO2 25 10/31/2011 0315   GLUCOSE 97 10/31/2011 0315   BUN 8 10/31/2011 0315   CREATININE 0.79 10/31/2011 0315   CALCIUM 8.1* 10/31/2011 0315   GFRNONAA >90 10/31/2011 0315   GFRAA >90 10/31/2011 0315    CBC    Component Value Date/Time   WBC 5.9 10/31/2011 0315   RBC 3.88* 10/31/2011 0315   HGB 11.3* 10/31/2011 0315   HCT 34.5* 10/31/2011 0315   PLT 147* 10/31/2011 0315   MCV 88.9 10/31/2011 0315   MCH 29.1 10/31/2011 0315   MCHC 32.8 10/31/2011 0315   RDW 15.3 10/31/2011 0315   LYMPHSABS 1.1 10/28/2011 2029   MONOABS 0.4 10/28/2011 2029   EOSABS 0.1 10/28/2011 2029   BASOSABS 0.0 10/28/2011 2029    CXR:  Mild vasc cong and bibasilar  atx   ASSESSMENT AND PLAN 1) Respiratory failure due to AMS after intentional OD. - resolved  2)  Ileus likely secondary to anticholinergic effect. - resolved    Diet intitiated 7/10 3) Seizure X 1 due to OD - No Rx or further eval indicated 4) Anticholinergic drug overdose with suicidal intent -  Psych Consult requested 7/09   Global:  DC foley, tx to reg bed 7/10 tx to [facility] [place] 7/11(called). PCCM will sign off as of 7/11 AM. Please call if we can be of further assistance   Billy Fischer, MD ; [facility] [phone].  After 5:30 PM or weekends, call [phone]

## 2011-10-31 NOTE — Consult Note (Addendum)
Patient Identification:  Radene Journey Date of Evaluation:  10/31/2011 Reason for Consult: Overdose, Suicidal Ideation  Referring Provider: Dr. Sherene Sires  History of Present IllnessPt says he decided he had not reason to live.  He went to pharmacy and bought two boxes of sleeping pills.  He says it was hard to get therm out = took a long time.  He says he does not know why he keeps doing this.  "I just get a wild hair and decide to die".  He admits he has done this often.   Past Psychiatric History:   Past Medical History: Very frequent suicide attempts by overdose of pills.  He has been at [facility] and says that it wasn't very therapeutic for him.  He says it is very difficult to live with MS.  [can't walk well]     Past Medical History  Diagnosis Date  . MS (multiple sclerosis)       History reviewed. No pertinent past surgical history.  Allergies: No Known Allergies  Current Medications:  Prior to Admission medications   Medication Sig Start Date End Date Taking? Authorizing Provider  amantadine (SYMMETREL) 100 MG capsule Take 100 mg by mouth 2 (two) times daily.   Yes Historical Provider, MD  ARIPiprazole (ABILIFY) 2 MG tablet Take 2 mg by mouth daily.   Yes Historical Provider, MD  citalopram (CELEXA) 40 MG tablet Take 40 mg by mouth daily.   Yes Historical Provider, MD  diclofenac (VOLTAREN) 50 MG EC tablet Take 50 mg by mouth 2 (two) times daily.   Yes Historical Provider, MD  doxepin (SINEQUAN) 10 MG capsule Take 10 mg by mouth at bedtime.   Yes Historical Provider, MD    Social History:    has an unknown smoking status. He does not have any smokeless tobacco history on file. He reports that he drinks alcohol. His drug history not on file.   Family History:    No family history on file.  Mental Status Examination/Evaluation: Objective:  Appearance: Fairly Groomed and has a long bushy gray-red beard  Psychomotor Activity:  Normal  Eye Contact::  Good  Speech:  Clear  and Coherent  Volume:  Normal  Mood:  Dysphoric  Affect:  Blunt, Congruent, Depressed and Flat  Thought Process:  Coherent and pollRIWS RHINKKINGerror, polarized thinking  Orientation:  Full  Thought Content:  Suicidal ideation  Suicidal Thoughts:  Yes.  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  AXIS IV economic problems, educational problems, other psychosocial or environmental problems, problems related to social environment and loose attachment to son  AXIS V 51-60 moderate symptoms    Assessment/Plan: Pt is sitting in a chair.  He has spontaneous speech and claims OD with pills is a mystery to him.  By Psych CSW he has been admitted an excessive number of times to [facility].  He says he had a wife who divorced him at the time Dx of MS.  He see his 56 yo son often, not scheduled visits.  He plays games and likes to win.  Pt tries to find games they play together.  He will say he has no reason for living.  He discounts any impact his suicide might have on his son.  He lives  in a group home and is transported to a 'Center' for daytime activities.  He has few outlets.  He says he never say the point of school He says he could have done anything  But did not.  He is aware of DBT therapy [effective for persistent suicidal ideat ion; pt did not take advantage of sessions taught at [facility].] He needs PT and research for a more activity centered group home may give him more interest in living.  RECOMMENDATION: 1. Consider excitalopram with abilify 2 mg for depression, suicidal ideation 2. If #1 started include Cogentin, benztropine, 0 .5 mg 2 X daily for EPS 3. Consider zolpidem 10 mg at bedtime.  4. Suggest PT as tolerated.  5. When medically stable, consider [facility] INTENSIVE OUTPATIENT THERAPY  when discharged 6. Explore option of different active group home.  Prince Rome MD Psychiatrist 10/31/2011 11:15 PM

## 2011-10-31 NOTE — Progress Notes (Signed)
Met with Pt to discuss d/c plans.  Pt continues to state, "I don't know why I do this.  This is so frustrating."  Pt reports that he isn't sure what he needs upon d/c and states that he's open to suggestions.  CSW discussed with Pt area inpt psych tx facilities and Pt voiced his willingness to consider each of these.  CSW thanked Pt for his time.  Psych MD to meet with Pt today to make recommendations.  CSW to continue to follow.  Providence Crosby, LCSWA Clinical Social Work [phone]

## 2011-11-01 ENCOUNTER — Encounter (HOSPITAL_COMMUNITY): Payer: Self-pay | Admitting: *Deleted

## 2011-11-01 DIAGNOSIS — T50901A Poisoning by unspecified drugs, medicaments and biological substances, accidental (unintentional), initial encounter: Secondary | ICD-10-CM

## 2011-11-01 DIAGNOSIS — G35 Multiple sclerosis: Secondary | ICD-10-CM

## 2011-11-01 DIAGNOSIS — R45851 Suicidal ideations: Secondary | ICD-10-CM

## 2011-11-01 LAB — BASIC METABOLIC PANEL
BUN: 9 mg/dL (ref 6–23)
CO2: 27 mEq/L (ref 19–32)
Calcium: 8.6 mg/dL (ref 8.4–10.5)
Chloride: 103 mEq/L (ref 96–112)
Creatinine, Ser: 0.89 mg/dL (ref 0.50–1.35)
GFR calc Af Amer: >90 mL/min (ref >90)
GFR calc non Af Amer: >90 mL/min (ref >90)
Glucose, Bld: 95 mg/dL (ref 70–99)
Potassium: 3.8 mEq/L (ref 3.5–5.1)
Sodium: 137 mEq/L (ref 135–145)

## 2011-11-01 MED ORDER — ZOLPIDEM TARTRATE 10 MG PO TABS: 10.0000 mg | ORAL_TABLET | Freq: Every evening | ORAL | Status: DC | PRN

## 2011-11-01 MED ORDER — ARIPIPRAZOLE 2 MG PO TABS
2.0000 mg | ORAL_TABLET | Freq: Every day | ORAL | Status: DC
  Administered 2011-11-02: 2 mg via ORAL
  Filled 2011-11-01 (×2): qty 1

## 2011-11-01 MED ORDER — BENZTROPINE MESYLATE 0.5 MG PO TABS
0.5000 mg | ORAL_TABLET | Freq: Two times a day (BID) | ORAL | Status: DC
Start: 2011-11-02 — End: 2011-11-02
  Administered 2011-11-02: 0.5 mg via ORAL
  Filled 2011-11-01 (×3): qty 1

## 2011-11-01 MED ORDER — ESCITALOPRAM OXALATE 10 MG PO TABS
10.0000 mg | ORAL_TABLET | Freq: Every day | ORAL | Status: DC
  Administered 2011-11-02: 10 mg via ORAL
  Filled 2011-11-01 (×2): qty 1

## 2011-11-01 NOTE — Progress Notes (Signed)
Nutrition Follow-up  Intervention:  Pt is eating well with 100% meal intake. Pt without any nutrition education needs. Signing off.   Assessment: Pt extubated on 10/30/11. Transferred to 5 East floor yesterday. Ileus likely secondary to anticholinergic effect resolved per MD. Noted pt has been eating excellent. Met with pt who states he is enjoying the food with good appetite.   Diet Order: Heart healthy, 100% meal intake   Meds: Scheduled Meds:   . antiseptic oral rinse  15 mL Mouth Rinse BID  . heparin  5,000 Units Subcutaneous Q8H   Continuous Infusions:  PRN Meds:.DISCONTD: albuterol  Labs:  CMP     Component Value Date/Time   NA 137 11/01/2011 0435   K 3.8 11/01/2011 0435   CL 103 11/01/2011 0435   CO2 27 11/01/2011 0435   GLUCOSE 95 11/01/2011 0435   BUN 9 11/01/2011 0435   CREATININE 0.89 11/01/2011 0435   CALCIUM 8.6 11/01/2011 0435   PROT 5.7* 10/28/2011 2029   ALBUMIN 3.3* 10/28/2011 2029   AST 21 10/28/2011 2029   ALT 24 10/28/2011 2029   ALKPHOS 47 10/28/2011 2029   BILITOT 0.6 10/28/2011 2029   GFRNONAA >90 11/01/2011 0435   GFRAA >90 11/01/2011 0435     Intake/Output Summary (Last 24 hours) at 11/01/11 0953 Last data filed at 11/01/11 0421  Gross per 24 hour  Intake    480 ml  Output   1700 ml  Net  -1220 ml   Last BM - PTA  Weight Status:   10/29/11  199 lb 8.3 oz 11/01/11 190 lb 4.1 oz  Re-estimated needs:  2150-2400 calories 85-105g protein   Nutrition Dx:  Inadequate oral intake r/t inability to take PO - no longer appropriate as diet started.   Goal: Diet tolerance after extubation - met   Monitor: Intake  Marshall Cork Pager: [phone]

## 2011-11-01 NOTE — Progress Notes (Signed)
LATE ENTRY [facility] PROGRESS NOTE  [name] ZOX:[id number] DOB: 08-27-1955 DOA: 10/28/2011 PCP: No primary provider on file.  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 -Resolved 5)MS -Follow and resume amantadine when appropriate -PT/OT consult -SW consult to assist with different active group home   Brief narrative: 56 year old male with PMH relevant for multiple sclerosis, depression and previous suicidal attempt. Presented 7/07 to the ED after ingesting 96 tablets of doxylamine. As per the ER personal, the patient was confused, agitated, delirious and had a witnessed seizure. He required intubation for airway protection from 7/7>>7/9. He was Never hemodynamically unstable. Pt was on CCM service through 7/10, and then transferred to hospitalist service beginning today 7/11. Psych was consulted and saw pt for eval today.   Consultants:  CCM  Psych  Procedures:  ETT - 7/7>>7/9 as above  Antibiotics:  none  HPI/Subjective: Pt sitting in chair, flat affect. Denies any c/o. Chart reviewed.  Objective: Filed Vitals:   11/01/11 0555 11/01/11 1040 11/01/11 1403 11/01/11 2211  BP: 104/66 132/80 122/80 115/69  Pulse: 83 70 75 62  Temp: 97.6 F (36.4 C) 97.9 F (36.6 C) 98 F (36.7 C) 98.7 F (37.1 C)  TempSrc: Oral Oral Oral Oral    Resp: 20 20 20 18   Height:      Weight: 86.3 kg (190 lb 4.1 oz)     SpO2: 95% 99% 98% 94%    Intake/Output Summary (Last 24 hours) at 11/01/11 2333 Last data filed at 11/01/11 1733  Gross per 24 hour  Intake    480 ml  Output    475 ml  Net      5 ml    Exam:   General:  Older WM in NAD  Cardiovascular: RRR, nl S1 , S2  Respiratory: clear  Abdomen: soft, NT/ND, no masses palpable  Ext.-no edema, No cyanosis  Data Reviewed: Basic Metabolic Panel:  Lab 11/01/11 8119 10/31/11 0315 10/30/11 0320 10/29/11 0455 10/28/11 2029  NA 137 138 138 140 138  K 3.8 3.6 2.9* 3.5 3.4*  CL 103 106 107 107 104  CO2 27 25 25 25 20   GLUCOSE 95 97 96 69* 48*  BUN 9 8 8 10 11   CREATININE 0.89 0.79 0.86 0.92 0.88  CALCIUM 8.6 8.1* 8.0* 8.1* 8.3*  MG -- -- 1.6 1.5 1.5  PHOS -- -- 2.7 3.5 3.5   Liver Function Tests:  Lab 10/28/11 2029 10/28/11 1749  AST 21 26  ALT 24 30  ALKPHOS 47 58  BILITOT 0.6 0.4  PROT 5.7* 7.2  ALBUMIN 3.3* 4.0    Lab 10/28/11 2029  LIPASE 54  AMYLASE 78   No results found for this basename: AMMONIA:5 in the last 168 hours CBC:  Lab 10/31/11 0315 10/30/11 0320 10/29/11 0455 10/28/11 2029 10/28/11 1749  WBC 5.9 6.1 7.8 5.9 7.7  NEUTROABS -- -- -- 4.3 5.2  HGB  11.3* 11.4* 12.4* 12.2* 14.4  HCT 34.5* 34.2* 36.2* 35.9* 41.4  MCV 88.9 89.1 88.9 88.0 88.1  PLT 147* 147* 172 174 198   Cardiac Enzymes:  Lab 10/29/11 1218 10/29/11 0455 10/28/11 2143 10/28/11 2029  CKTOTAL 576* 791* 117 119  CKMB 1.3 1.8 -- 1.5  CKMBINDEX -- -- -- --  TROPONINI <0.30 <0.30 -- <0.30   BNP (last 3 results)  Basename 10/28/11 2029  PROBNP 346.6*   CBG:  Lab 10/31/11 1138 10/31/11 0753 10/31/11 0541 10/31/11 0003 10/30/11 1921  GLUCAP 91 158* 92 105* 141*    Recent Results (from the past 240 hour(s))  CULTURE, BLOOD (ROUTINE X 2)     Status: Normal (Preliminary result)   Collection Time   10/28/11  9:55 PM      Component Value Range Status Comment   Specimen  Description BLOOD LEFT ARM   Final    Special Requests     Final    Value: BOTTLES DRAWN AEROBIC AND ANAEROBIC 4 CC RED, 6 CC BLUE   Culture  Setup Time 10/29/2011 01:11   Final    Culture     Final    Value:        BLOOD CULTURE RECEIVED NO GROWTH TO DATE CULTURE WILL BE HELD FOR 5 DAYS BEFORE ISSUING A FINAL NEGATIVE REPORT   Report Status PENDING   Incomplete   CULTURE, BLOOD (ROUTINE X 2)     Status: Normal (Preliminary result)   Collection Time   10/28/11  9:55 PM      Component Value Range Status Comment   Specimen Description BLOOD LEFT HAND   Final    Special Requests BOTTLES DRAWN AEROBIC AND ANAEROBIC 5 CC EA   Final    Culture  Setup Time 10/29/2011 01:11   Final    Culture     Final    Value: STAPHYLOCOCCUS SPECIES     Note: Gram Stain Report Called to,Read Back By and Verified With: SUSAN @ 1758 ON 10/30/11 BY GOLLD   Report Status PENDING   Incomplete   MRSA PCR SCREENING     Status: Normal   Collection Time   10/28/11 11:50 PM      Component Value Range Status Comment   MRSA by PCR NEGATIVE  NEGATIVE Final   CULTURE, RESPIRATORY     Status: Normal   Collection Time   10/29/11  9:51 AM      Component Value Range Status Comment   Specimen Description TRACHEAL ASPIRATE   Final    Special Requests NONE   Final    Gram Stain     Final    Value: RARE WBC PRESENT,BOTH PMN AND MONONUCLEAR     NO SQUAMOUS EPITHELIAL CELLS SEEN     NO ORGANISMS SEEN   Culture FEW CANDIDA ALBICANS   Final    Report Status 10/31/2011 FINAL   Final      Studies: Ct Head Wo Contrast  10/28/2011  *RADIOLOGY REPORT*  Clinical Data: Altered mental status.  CT HEAD WITHOUT CONTRAST  Technique:  Contiguous axial images were obtained from the base of the skull through the vertex without contrast.  Comparison: None.  Findings: Motion artifact was present on the initial scan.  These slices were repeated. The study is diagnostic.  Periventricular white matter disease is present compatible with clinical history  of multiple sclerosis. No mass lesion, mass effect, midline shift, hydrocephalus, hemorrhage.  No territorial ischemia or acute infarction.  Mild atrophy.  Paranasal sinuses appear within normal limits.  Calvarium intact.  IMPRESSION:  1.  No acute intracranial abnormality. 2.  Periventricular white matter lesions compatible with multiple sclerosis.  Original Report Authenticated By: Andreas Newport, M.D.   Dg Chest Port 1 View  10/31/2011  *RADIOLOGY REPORT*  Clinical Data: Atelectasis  PORTABLE CHEST - 1 VIEW  Comparison: 10/30/2011  Findings: Portable view of the chest demonstrates removal of the endotracheal tube and nasogastric tube.  There are slightly increased perihilar densities which may represent volume loss and atelectasis.  Heart size is grossly stable.  Patchy basilar densities are suggestive for atelectasis.  IMPRESSION: Slightly increased perihilar and basilar densities.  Findings are most compatible with areas of atelectasis.  Removal of endotracheal tube and nasogastric tube.  Original Report Authenticated By: Richarda Overlie, M.D.   Dg Chest Port 1 View  10/30/2011  *RADIOLOGY REPORT*  Clinical Data: Ventilated patient.  PORTABLE CHEST - 1 VIEW  Comparison: 10/28/2011.  Findings: Endotracheal tube and enteric tube are present. Proximal side port the enteric tube is near the gastroesophageal junction and the tube could be advanced a few cm for better positioning into prevent gastroesophageal reflux.  Increased bibasilar density is present, likely representing atelectasis.  No focal consolidation. Airspace disease at the bases is difficult to exclude.  Attention on follow-up recommended. Cardiopericardial silhouette unchanged.  IMPRESSION: Unchanged endotracheal tube. Minimal retraction of enteric tube with proximal side port at the gastroesophageal junction.  This tube could be advanced a few centimeters for more secure positioning within the stomach.  Worsening basilar aeration, likely atelectasis.   Original Report Authenticated By: Andreas Newport, M.D.   Dg Chest Portable 1 View  10/28/2011  *RADIOLOGY REPORT*  Clinical Data: Endotracheal tube placement.  Drug overdose.  PORTABLE CHEST - 1 VIEW  Comparison: None.  Findings: Endotracheal tube is 39 mm from the carina.  Enteric tube is present with proximal side port in the stomach.  Low lung volumes are present with basilar atelectasis.  Cardiopericardial silhouette appears within normal limits.  IMPRESSION:  1.  Support apparatus in good position. 2.  Low volume chest with basilar atelectasis.  Original Report Authenticated By: Andreas Newport, M.D.    Scheduled Meds:   . antiseptic oral rinse  15 mL Mouth Rinse BID  . heparin  5,000 Units Subcutaneous Q8H   Continuous Infusions:    Kela Millin [facility] Pager [phone]  If 7PM-7AM, please contact night-coverage [email or web address] Password [email or web address] 11/01/2011, 11:33 PM   LOS: 4 days

## 2011-11-02 DIAGNOSIS — T4271XA Poisoning by unspecified antiepileptic and sedative-hypnotic drugs, accidental (unintentional), initial encounter: Secondary | ICD-10-CM

## 2011-11-02 DIAGNOSIS — F329 Major depressive disorder, single episode, unspecified: Secondary | ICD-10-CM

## 2011-11-02 DIAGNOSIS — T426X2A Poisoning by other antiepileptic and sedative-hypnotic drugs, intentional self-harm, initial encounter: Secondary | ICD-10-CM

## 2011-11-02 LAB — CULTURE, BLOOD (ROUTINE X 2)

## 2011-11-02 MED ORDER — BENZTROPINE MESYLATE 0.5 MG PO TABS: 0.5000 mg | ORAL_TABLET | Freq: Two times a day (BID) | ORAL | Status: DC

## 2011-11-02 MED ORDER — ESCITALOPRAM OXALATE 10 MG PO TABS: 10.0000 mg | ORAL_TABLET | Freq: Every day | ORAL | Status: DC

## 2011-11-02 MED ORDER — ZOLPIDEM TARTRATE 10 MG PO TABS: 10.0000 mg | ORAL_TABLET | Freq: Every evening | ORAL | Status: DC | PRN

## 2011-11-02 NOTE — Progress Notes (Signed)
Met with Pt re: d/c plans.  Pt reports that he has an outpt appt with a psychiatrist at [facility] on the 15th of this month and an appt with a therapist at [facility] on the 24th.  Pt interested in [facility] IOP.  LM for Rita, IOP facilitator, at [phone].  CSW to continue to follow.  Providence Crosby, LCSWA Clinical Social Work [phone]

## 2011-11-02 NOTE — Progress Notes (Addendum)
Physical Therapy Note  Order received. Chart reviewed. Spoke briefly with pt who denies need for PT services and states he is at his baseline. Observed pt ambulating in room without assistance. PT will sign off. Thanks. Rebeca Alert, PT  [phone]

## 2011-11-02 NOTE — Progress Notes (Signed)
Pt requesting to take a shower.  Per Dr Donna Bernard, ok to shower.  Pt has IV in rt hand that is due to be changed.  Questioned Dr Donna Bernard if pt needs new saline lock.  Per MD, leave current IV until she rounds on pt today, then decision to be made about need for access.  dph

## 2011-11-02 NOTE — Progress Notes (Signed)
Progress Note,follow up consult. Pt is seen while sitting in chair. He greets with expectation that he would be greeted . He is asked whether he recalls our discussion yesterday. He says no. He admits to having a poor memory. He says he is pretty good with multiplication. While this interview continues, his toes, constantly 'wiggle'. He repeats his suicide attempt and how difficult it was To extract so many pills from bubble wrap. He says he went to a motel room and after taking the pills, he called the woman at the groups home. He says he has to stop doing this. He is asked if he likes the group home. He says he does but the food is poor. It is observed with him that he could take all the money he spends on a motel room and enjoy at least a couple of good meals at a restaurant. He agrees. He is asked about his son. He says he loves him and believes his son loves him. He is reminded 'loss is painful'. He says his life left him with his Dx. He says she told him she could not live with his depression. "What happened to 'in sickness and in health'?" He openly expresses his anger and disillusionment. He says repeatedly he has to stop the frequent suicide attempts. He is asked how:and is reminded that he took pills, then called the group home. He is encouraged to think about that effort and TRY to make the call Before he attempts anything. He says he wants to return to his group home.  RECOMMENDATION:  1. Pt has capacity and is to be referred to group home when medically stable.  2. Suggest increase Lexapro to 20 mg daily  3. Suggest referral to Intensive Outpatient Program at [facility]  4. Suggest discontinue sitter.  Taniah Reinecke J. Ferol Luz, MD  Psychiatrist  11/02/2011 12:45 AM

## 2011-11-02 NOTE — Progress Notes (Addendum)
Pre-discharge progress note:  Pt is seen ~11:30 am  He is eating lunch.  He says he does not feel suicidal.  He says he just has to quit 'doing this'  Ie. Attempting suicide.  He is encouraged to attend DBT classes at [facility].  He says he will try to take the information more seriously.  He has an appt with therapist and psychiatrist next week. He says he has no suicidal thoughts at the moment. Pt is alert cognitively intact and calm .DIAGNOSIS:  AXIS I  Major depression due to medical condition, Suicidal ideation with overdose   AXIS II  Borderline Personality Disorder   AXIS III  See medical notes.   AXIS IV  economic problems, educational problems, other psychosocial or environmental problems, problems related to social environment and loose attachment to son   AXIS V  51-60 moderate symptoms     Discussed outpt plans with Psych CSW.  Lucky Alverson J. Ferol Luz, MD Psychiatrist  11/02/2011 9:01 PM

## 2011-11-02 NOTE — Discharge Summary (Signed)
Physician Discharge Summary  [name] ONG:[id number] DOB: 08/03/1955 DOA: 10/28/2011  PCP: No primary provider on file.  Admit date: 10/28/2011 Discharge date: 11/02/2011  Recommendations for Outpatient Follow-up:  PCP in one week, call for appointment Outpatient intensive therapy program at [facility]. as set up Discharge Diagnoses:  Active Problems:  Acute respiratory failure with hypoxia  Drug overdose  Suicidal ideation  Multiple sclerosis   Discharge Condition: improved/stable  Diet recommendation: regular  Brief History of present illness:   56 year old male with PMH relevant for multiple sclerosis, depression and previous suicidal attempt. Presented 7/07 to the ED after ingesting 96 tablets of doxylamine. As per the ER personal, the patient was confused, agitated, delirious and had a witnessed seizure. He required intubation for airway protection from 7/7>>7/9. He was Never hemodynamically unstable. Pt was on CCM service through 7/10, and then transferred to hospitalist service beginning today 7/11. Psych was consulted and saw pt for eval today.  Hospital Course by problem list:  1) Anticholinergic drug overdose  -Pt was admitted to [facility] service and intubated for airway protection from 7/7>>7/9 for airway protection, he remained hemodynamically stable.  - She was consulted and saw patient and recommended  [facility] intensive outpatient therapy when dc'ed, and signed off getting that from the standpoint patient was okay for discharge her. -H. and has been set up for outpatient followup at this time and will be discharged home to follow up at [facility].  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 Ferol Luz /psychiatrist saw patient and recommended to restart abilify and changed to lexapro (Celexa discontinued), along with cogentin as per psych recs and this was done, patient is to follow up out  patient intensive therapy and [facility] as above. 4) Respiratory failure 2/2 AMS after intentional OD.  -Patient was intubated and managed by critical care/CCM as discussed above and this resolved. He has been oxygenating well on room air and does not require any supplemental oxygen. 5)MS  -He is to resume his amantadine on discharge, PT OT was consulted and saw the patient but he declined these services stating that he was at baseline. He she is being discharged back to group home at this time and is eager to go back.  Consultants:  CCM  Psychiatrist, Dr Ferol Luz Procedures:  ETT - 7/7>>7/9 as above  Discharge Exam: Filed Vitals:   11/02/11 1353  BP: 112/71  Pulse: 86  Temp: 98.6 F (37 C)  Resp: 18   Filed Vitals:   11/01/11 1403 11/01/11 2211 11/02/11 0604 11/02/11 1353  BP: 122/80 115/69 110/75 112/71  Pulse: 75 62 75 86  Temp: 98 F (36.7 C) 98.7 F (37.1 C) 98.4 F (36.9 C) 98.6 F (37 C)  TempSrc: Oral Oral Oral Oral  Resp: 20 18 20 18   Height:      Weight:   84.6 kg (186 lb 8.2 oz)   SpO2: 98% 94% 100% 93%   Exam:  General: Older WM in NAD  Cardiovascular: RRR, nl S1 , S2  Respiratory: clear  Abdomen: soft, NT/ND, no masses palpable  Ext.-no edema, No cyanosis   Discharge Instructions  Discharge Orders    Future Orders Please Complete By Expires   Diet general      Increase activity slowly        Medication List  As of 11/02/2011  6:44 PM   STOP taking  these medications         citalopram 40 MG tablet      doxepin 10 MG capsule         TAKE these medications         amantadine 100 MG capsule   Commonly known as: SYMMETREL   Take 100 mg by mouth 2 (two) times daily.      ARIPiprazole 2 MG tablet   Commonly known as: ABILIFY   Take 2 mg by mouth daily.      benztropine 0.5 MG tablet   Commonly known as: COGENTIN   Take 1 tablet (0.5 mg total) by mouth 2 (two) times daily.      diclofenac 50 MG EC tablet   Commonly known as: VOLTAREN   Take 50  mg by mouth 2 (two) times daily.      escitalopram 10 MG tablet   Commonly known as: LEXAPRO   Take 1 tablet (10 mg total) by mouth daily.      zolpidem 10 MG tablet   Commonly known as: AMBIEN   Take 1 tablet (10 mg total) by mouth at bedtime as needed for sleep.           Follow-up Information    Please follow up. (PCP IN 1WEEK, call for appt upon discharge)       Please follow up. ([facility] OUTPT PROGRAM AS SET UP PER  Psychiatrist/social worker)           The results of significant diagnostics from this hospitalization (including imaging, microbiology, ancillary and laboratory) are listed below for reference.    Significant Diagnostic Studies: Ct Head Wo Contrast  10/28/2011  *RADIOLOGY REPORT*  Clinical Data: Altered mental status.  CT HEAD WITHOUT CONTRAST  Technique:  Contiguous axial images were obtained from the base of the skull through the vertex without contrast.  Comparison: None.  Findings: Motion artifact was present on the initial scan.  These slices were repeated. The study is diagnostic.  Periventricular white matter disease is present compatible with clinical history of multiple sclerosis. No mass lesion, mass effect, midline shift, hydrocephalus, hemorrhage.  No territorial ischemia or acute infarction.  Mild atrophy.  Paranasal sinuses appear within normal limits.  Calvarium intact.  IMPRESSION:  1.  No acute intracranial abnormality. 2.  Periventricular white matter lesions compatible with multiple sclerosis.  Original Report Authenticated By: Andreas Newport, M.D.   Dg Chest Port 1 View  10/31/2011  *RADIOLOGY REPORT*  Clinical Data: Atelectasis  PORTABLE CHEST - 1 VIEW  Comparison: 10/30/2011  Findings: Portable view of the chest demonstrates removal of the endotracheal tube and nasogastric tube.  There are slightly increased perihilar densities which may represent volume loss and atelectasis.  Heart size is grossly stable.  Patchy basilar densities are suggestive for  atelectasis.  IMPRESSION: Slightly increased perihilar and basilar densities.  Findings are most compatible with areas of atelectasis.  Removal of endotracheal tube and nasogastric tube.  Original Report Authenticated By: Richarda Overlie, M.D.   Dg Chest Port 1 View  10/30/2011  *RADIOLOGY REPORT*  Clinical Data: Ventilated patient.  PORTABLE CHEST - 1 VIEW  Comparison: 10/28/2011.  Findings: Endotracheal tube and enteric tube are present. Proximal side port the enteric tube is near the gastroesophageal junction and the tube could be advanced a few cm for better positioning into prevent gastroesophageal reflux.  Increased bibasilar density is present, likely representing atelectasis.  No focal consolidation. Airspace disease at the bases is difficult to exclude.  Attention  on follow-up recommended. Cardiopericardial silhouette unchanged.  IMPRESSION: Unchanged endotracheal tube. Minimal retraction of enteric tube with proximal side port at the gastroesophageal junction.  This tube could be advanced a few centimeters for more secure positioning within the stomach.  Worsening basilar aeration, likely atelectasis.  Original Report Authenticated By: Andreas Newport, M.D.   Dg Chest Portable 1 View  10/28/2011  *RADIOLOGY REPORT*  Clinical Data: Endotracheal tube placement.  Drug overdose.  PORTABLE CHEST - 1 VIEW  Comparison: None.  Findings: Endotracheal tube is 39 mm from the carina.  Enteric tube is present with proximal side port in the stomach.  Low lung volumes are present with basilar atelectasis.  Cardiopericardial silhouette appears within normal limits.  IMPRESSION:  1.  Support apparatus in good position. 2.  Low volume chest with basilar atelectasis.  Original Report Authenticated By: Andreas Newport, M.D.    Microbiology: Recent Results (from the past 240 hour(s))  CULTURE, BLOOD (ROUTINE X 2)     Status: Normal (Preliminary result)   Collection Time   10/28/11  9:55 PM      Component Value Range Status  Comment   Specimen Description BLOOD LEFT ARM   Final    Special Requests     Final    Value: BOTTLES DRAWN AEROBIC AND ANAEROBIC 4 CC RED, 6 CC BLUE   Culture  Setup Time 10/29/2011 01:11   Final    Culture     Final    Value:        BLOOD CULTURE RECEIVED NO GROWTH TO DATE CULTURE WILL BE HELD FOR 5 DAYS BEFORE ISSUING A FINAL NEGATIVE REPORT   Report Status PENDING   Incomplete   CULTURE, BLOOD (ROUTINE X 2)     Status: Normal   Collection Time   10/28/11  9:55 PM      Component Value Range Status Comment   Specimen Description BLOOD LEFT HAND   Final    Special Requests BOTTLES DRAWN AEROBIC AND ANAEROBIC 5 CC EA   Final    Culture  Setup Time 10/29/2011 01:11   Final    Culture     Final    Value: STAPHYLOCOCCUS SPECIES (COAGULASE NEGATIVE)     Note: THE SIGNIFICANCE OF ISOLATING THIS ORGANISM FROM A SINGLE SET OF BLOOD CULTURES WHEN MULTIPLE SETS ARE DRAWN IS UNCERTAIN. PLEASE NOTIFY THE MICROBIOLOGY DEPARTMENT WITHIN ONE WEEK IF SPECIATION AND SENSITIVITIES ARE REQUIRED.     Note: Gram Stain Report Called to,Read Back By and Verified With: SUSAN @ 1758 ON 10/30/11 BY GOLLD   Report Status 11/02/2011 FINAL   Final   MRSA PCR SCREENING     Status: Normal   Collection Time   10/28/11 11:50 PM      Component Value Range Status Comment   MRSA by PCR NEGATIVE  NEGATIVE Final   CULTURE, RESPIRATORY     Status: Normal   Collection Time   10/29/11  9:51 AM      Component Value Range Status Comment   Specimen Description TRACHEAL ASPIRATE   Final    Special Requests NONE   Final    Gram Stain     Final    Value: RARE WBC PRESENT,BOTH PMN AND MONONUCLEAR     NO SQUAMOUS EPITHELIAL CELLS SEEN     NO ORGANISMS SEEN   Culture FEW CANDIDA ALBICANS   Final    Report Status 10/31/2011 FINAL   Final      Labs: Basic Metabolic Panel:  Lab 11/01/11  6295 10/31/11 0315 10/30/11 0320 10/29/11 0455 10/28/11 2029  NA 137 138 138 140 138  K 3.8 3.6 2.9* 3.5 3.4*  CL 103 106 107 107 104  CO2 27 25  25 25 20   GLUCOSE 95 97 96 69* 48*  BUN 9 8 8 10 11   CREATININE 0.89 0.79 0.86 0.92 0.88  CALCIUM 8.6 8.1* 8.0* 8.1* 8.3*  MG -- -- 1.6 1.5 1.5  PHOS -- -- 2.7 3.5 3.5   Liver Function Tests:  Lab 10/28/11 2029 10/28/11 1749  AST 21 26  ALT 24 30  ALKPHOS 47 58  BILITOT 0.6 0.4  PROT 5.7* 7.2  ALBUMIN 3.3* 4.0    Lab 10/28/11 2029  LIPASE 54  AMYLASE 78   No results found for this basename: AMMONIA:5 in the last 168 hours CBC:  Lab 10/31/11 0315 10/30/11 0320 10/29/11 0455 10/28/11 2029 10/28/11 1749  WBC 5.9 6.1 7.8 5.9 7.7  NEUTROABS -- -- -- 4.3 5.2  HGB 11.3* 11.4* 12.4* 12.2* 14.4  HCT 34.5* 34.2* 36.2* 35.9* 41.4  MCV 88.9 89.1 88.9 88.0 88.1  PLT 147* 147* 172 174 198   Cardiac Enzymes:  Lab 10/29/11 1218 10/29/11 0455 10/28/11 2143 10/28/11 2029  CKTOTAL 576* 791* 117 119  CKMB 1.3 1.8 -- 1.5  CKMBINDEX -- -- -- --  TROPONINI <0.30 <0.30 -- <0.30   BNP: BNP (last 3 results)  Basename 10/28/11 2029  PROBNP 346.6*   CBG:  Lab 10/31/11 1138 10/31/11 0753 10/31/11 0541 10/31/11 0003 10/30/11 1921  GLUCAP 91 158* 92 105* 141*    Time coordinating discharge: >66mins  Signed:  Valeree Leidy C  [facility] 11/02/2011, 6:44 PM

## 2011-11-02 NOTE — Progress Notes (Deleted)
Progress Note,follow up consult. Pt is seen while sitting in chair. He greets with expectation that he would be greeted . He is asked whether he recalls our discussion yesterday. He says no. He admits to having a poor memory. He says he is pretty good with multiplication. While this interview continues, his toes, constantly 'wiggle'. He repeats his suicide attempt and how difficult it was To extract so many pills from bubble wrap. He says he went to a motel room and after taking the pills, he called the woman at the groups home. He says he has to stop doing this. He is asked if he likes the group home. He says he does but the food is poor. It is observed with him that he could take all the money he spends on a motel room and enjoy at least a couple of good meals at a restaurant. He agrees. He is asked about his son. He says he loves him and believes his son loves him. He is reminded 'loss is painful'. He says his life left him with his Dx. He says she told him she could not live with his depression. "What happened to 'in sickness and in health'?" He openly expresses his anger and disillusionment. He says repeatedly he has to stop the frequent suicide attempts. He is asked how:and is reminded that he took pills, then called the group home. He is encouraged to think about that effort and TRY to make the call Before he attempts anything. He says he wants to return to his group home.  RECOMMENDATION:  1. Pt has capacity and is to be referred to group home when medically stable.  2. Suggest increase Lexapro to 20 mg daily  3. Suggest referral to Intensive Outpatient Program at [facility]  4. Suggest discontinue sitter.  PHYLLIS J. BOGARD, MD  Psychiatrist  11/02/2011 12:45 AM   

## 2011-11-04 LAB — CULTURE, BLOOD (ROUTINE X 2): Culture: NO GROWTH

## 2011-11-05 ENCOUNTER — Encounter (HOSPITAL_COMMUNITY): Payer: Self-pay

## 2011-11-19 ENCOUNTER — Emergency Department (HOSPITAL_COMMUNITY)
Admission: EM | Admit: 2011-11-19 | Discharge: 2011-11-23 | Disposition: A | Discharge status: Other Acute Inpatient Hospital | Payer: Medicare Other | Attending: Emergency Medicine | Admitting: Emergency Medicine

## 2011-11-19 ENCOUNTER — Encounter (HOSPITAL_COMMUNITY): Payer: Self-pay | Admitting: *Deleted

## 2011-11-19 DIAGNOSIS — R45851 Suicidal ideations: Secondary | ICD-10-CM | POA: Insufficient documentation

## 2011-11-19 DIAGNOSIS — F329 Major depressive disorder, single episode, unspecified: Secondary | ICD-10-CM | POA: Insufficient documentation

## 2011-11-19 DIAGNOSIS — F17200 Nicotine dependence, unspecified, uncomplicated: Secondary | ICD-10-CM | POA: Insufficient documentation

## 2011-11-19 DIAGNOSIS — F3289 Other specified depressive episodes: Secondary | ICD-10-CM | POA: Insufficient documentation

## 2011-11-19 DIAGNOSIS — G35 Multiple sclerosis: Secondary | ICD-10-CM | POA: Insufficient documentation

## 2011-11-19 DIAGNOSIS — Z046 Encounter for general psychiatric examination, requested by authority: Secondary | ICD-10-CM | POA: Insufficient documentation

## 2011-11-19 DIAGNOSIS — Z79899 Other long term (current) drug therapy: Secondary | ICD-10-CM | POA: Insufficient documentation

## 2011-11-19 LAB — COMPREHENSIVE METABOLIC PANEL
ALT: 14 U/L (ref 0–53)
AST: 14 U/L (ref 0–37)
Albumin: 4.3 g/dL (ref 3.5–5.2)
Alkaline Phosphatase: 70 U/L (ref 39–117)
BUN: 15 mg/dL (ref 6–23)
CO2: 25 mEq/L (ref 19–32)
Calcium: 9.4 mg/dL (ref 8.4–10.5)
Chloride: 102 mEq/L (ref 96–112)
Creatinine, Ser: 1.2 mg/dL (ref 0.50–1.35)
GFR calc Af Amer: 76 mL/min — ABNORMAL LOW (ref >90)
GFR calc non Af Amer: 66 mL/min — ABNORMAL LOW (ref >90)
Glucose, Bld: 76 mg/dL (ref 70–99)
Potassium: 4.5 mEq/L (ref 3.5–5.1)
Sodium: 138 mEq/L (ref 135–145)
Total Bilirubin: 0.3 mg/dL (ref 0.3–1.2)
Total Protein: 7.3 g/dL (ref 6.0–8.3)

## 2011-11-19 LAB — CBC WITH DIFFERENTIAL/PLATELET
Basophils Absolute: 0 10*3/uL (ref 0.0–0.1)
Basophils Relative: 1 % (ref 0–1)
Eosinophils Absolute: 0.1 10*3/uL (ref 0.0–0.7)
Eosinophils Relative: 2 % (ref 0–5)
HCT: 42.7 % (ref 39.0–52.0)
Hemoglobin: 14.1 g/dL (ref 13.0–17.0)
Lymphocytes Relative: 43 % (ref 12–46)
Lymphs Abs: 2.4 10*3/uL (ref 0.7–4.0)
MCH: 30.1 pg (ref 26.0–34.0)
MCHC: 33 g/dL (ref 30.0–36.0)
MCV: 91 fL (ref 78.0–100.0)
Monocytes Absolute: 0.3 10*3/uL (ref 0.1–1.0)
Monocytes Relative: 6 % (ref 3–12)
Neutro Abs: 2.7 10*3/uL (ref 1.7–7.7)
Neutrophils Relative %: 49 % (ref 43–77)
Platelets: 264 10*3/uL (ref 150–400)
RBC: 4.69 MIL/uL (ref 4.22–5.81)
RDW: 15.4 % (ref 11.5–15.5)
WBC: 5.5 10*3/uL (ref 4.0–10.5)

## 2011-11-19 LAB — RAPID URINE DRUG SCREEN, HOSP PERFORMED
Amphetamines: NOT DETECTED
Barbiturates: POSITIVE — AB
Benzodiazepines: NOT DETECTED
Cocaine: NOT DETECTED
Opiates: POSITIVE — AB
Tetrahydrocannabinol: POSITIVE — AB

## 2011-11-19 LAB — ACETAMINOPHEN LEVEL: Acetaminophen (Tylenol), Serum: <15 ug/mL (ref 10–30)

## 2011-11-19 LAB — SALICYLATE LEVEL: Salicylate Lvl: <2 mg/dL — ABNORMAL LOW (ref 2.8–20.0)

## 2011-11-19 LAB — ETHANOL: Alcohol, Ethyl (B): <11 mg/dL (ref 0–11)

## 2011-11-19 NOTE — ED Notes (Signed)
Pt states "I was @ [facility], was there for a check up, feeling SI, what I usually do is get a motel room up @ [facility], go to [facility], buy some sleeping pills & a little bit of beer and I'm set, the group home doesn't care what we do if we're good, the woman @ [place] today asked me how I felt and I said about the same as last time, I talker her out of sending me the last time, I just want it to be over, I have MS, see Dr. Despina Arias in [place], on a new drug that is 4-5 thousand dollars a month, the only family I got is my son, a half brother and my nephew but I have seen them in a while, have tried to hurt myself multiple times, I feel hopless, useless"

## 2011-11-19 NOTE — ED Notes (Signed)
Pt is IVC 

## 2011-11-19 NOTE — ED Provider Notes (Signed)
History     CSN: [id number]  Arrival date & time 11/19/11  1854   First MD Initiated Contact with Patient 11/19/11 1908      Chief Complaint  Patient presents with  . V70.1    (Consider location/radiation/quality/duration/timing/severity/associated sxs/prior treatment) The history is provided by the patient.   Here with suicidal ideations and plan. Seen at [facility] and placed under ivc . Pt here for medical clearence . He denies any ingestions at this time. No auditory or visual hallucinations. No new medications taken prior to arrival. History of suicide attempt in the past which did require intubation Past Medical History  Diagnosis Date  . Mental disorder   . Depression   . Neuromuscular disorder   . Multiple sclerosis, relapsing-remitting   . MS (multiple sclerosis)     Past Surgical History  Procedure Date  . Tonsillectomy     No family history on file.  History  Substance Use Topics  . Smoking status: Current Everyday Smoker -- 0.5 packs/day for 36 years    Types: Cigarettes  . Smokeless tobacco: Not on file  . Alcohol Use: Yes     "hardly drink except when I get into a hotel room, probably every 6 months or so"      Review of Systems  All other systems reviewed and are negative.    Allergies  Review of patient's allergies indicates no known allergies.  Home Medications   Current Outpatient Rx  Name Route Sig Dispense Refill  . AMANTADINE HCL 100 MG PO CAPS Oral Take 1 capsule (100 mg total) by mouth 2 (two) times daily. For MS 60 capsule 0  . AMANTADINE HCL 100 MG PO CAPS Oral Take 100 mg by mouth 2 (two) times daily.    . ARIPIPRAZOLE 2 MG PO TABS Oral Take 1 tablet (2 mg total) by mouth daily. For mood control. 30 tablet 0  . ARIPIPRAZOLE 2 MG PO TABS Oral Take 2 mg by mouth daily.    [name] BENZTROPINE MESYLATE 0.5 MG PO TABS Oral Take 1 tablet (0.5 mg total) by mouth 2 (two) times daily. 60 tablet 0  . BUPROPION HCL ER (XL) 150 MG PO TB24 Oral Take 1  tablet (150 mg total) by mouth daily. For depression 30 tablet 0  . CITALOPRAM HYDROBROMIDE 40 MG PO TABS Oral Take 1 tablet (40 mg total) by mouth daily. For depression. 30 tablet 0  . DICLOFENAC SODIUM 50 MG PO TBEC Oral Take 1 tablet (50 mg total) by mouth 2 (two) times daily. For inflammation 60 tablet 0  . DICLOFENAC SODIUM 50 MG PO TBEC Oral Take 50 mg by mouth 2 (two) times daily.    [name] ESCITALOPRAM OXALATE 10 MG PO TABS Oral Take 1 tablet (10 mg total) by mouth daily. 30 tablet 0  . RISPERIDONE 2 MG PO TABS Oral Take 1 tablet (2 mg total) by mouth at bedtime. For insomnia and mood control 30 tablet 0  . ZOLPIDEM TARTRATE 10 MG PO TABS Oral Take 1 tablet (10 mg total) by mouth at bedtime as needed for sleep. 30 tablet 0    BP 126/79  Pulse 61  Temp 97.7 F (36.5 C) (Oral)  Resp 20  Wt 175 lb (79.379 kg)  SpO2 100%  Physical Exam  Nursing note and vitals reviewed. Constitutional: He is oriented to person, place, and time. He appears well-developed and well-nourished.  Non-toxic appearance. No distress.  HENT:  Head: Normocephalic and atraumatic.  Eyes: Conjunctivae, EOM  and lids are normal. Pupils are equal, round, and reactive to light.  Neck: Normal range of motion. Neck supple. No tracheal deviation present. No mass present.  Cardiovascular: Normal rate, regular rhythm and normal heart sounds.  Exam reveals no gallop.   No murmur heard. Pulmonary/Chest: Effort normal and breath sounds normal. No stridor. No respiratory distress. He has no decreased breath sounds. He has no wheezes. He has no rhonchi. He has no rales.  Abdominal: Soft. Normal appearance and bowel sounds are normal. He exhibits no distension. There is no tenderness. There is no rebound and no CVA tenderness.  Musculoskeletal: Normal range of motion. He exhibits no edema and no tenderness.  Neurological: He is alert and oriented to person, place, and time. He has normal strength. No cranial nerve deficit or sensory  deficit. GCS eye subscore is 4. GCS verbal subscore is 5. GCS motor subscore is 6.  Skin: Skin is warm and dry. No abrasion and no rash noted.  Psychiatric: His behavior is normal. His affect is blunt. He expresses suicidal ideation.    ED Course  Procedures (including critical care time)   Labs Reviewed  ETHANOL  CBC WITH DIFFERENTIAL  COMPREHENSIVE METABOLIC PANEL  URINE RAPID DRUG SCREEN (HOSP PERFORMED)   No results found.   No diagnosis found.    MDM  Patient to be seen by the behavior health assessment team and placed        Toy Baker, MD 11/21/11 [phone]

## 2011-11-20 MED ORDER — ACETAMINOPHEN 325 MG PO TABS: 650.0000 mg | ORAL_TABLET | ORAL | Status: DC | PRN

## 2011-11-20 MED ORDER — ALUM & MAG HYDROXIDE-SIMETH 200-200-20 MG/5ML PO SUSP: 30.0000 mL | ORAL | Status: DC | PRN

## 2011-11-20 MED ORDER — ARIPIPRAZOLE 2 MG PO TABS
2.0000 mg | ORAL_TABLET | Freq: Three times a day (TID) | ORAL | Status: DC
Start: 2011-11-20 — End: 2011-11-23
  Administered 2011-11-20 – 2011-11-23 (×10): 2 mg via ORAL
  Filled 2011-11-20 (×12): qty 1

## 2011-11-20 MED ORDER — IBUPROFEN 200 MG PO TABS: 600.0000 mg | ORAL_TABLET | Freq: Three times a day (TID) | ORAL | Status: DC | PRN

## 2011-11-20 MED ORDER — AMANTADINE HCL 100 MG PO CAPS
100.0000 mg | ORAL_CAPSULE | Freq: Two times a day (BID) | ORAL | Status: DC
  Administered 2011-11-20 – 2011-11-23 (×8): 100 mg via ORAL
  Filled 2011-11-20 (×9): qty 1

## 2011-11-20 MED ORDER — BENZTROPINE MESYLATE 1 MG PO TABS
0.5000 mg | ORAL_TABLET | Freq: Two times a day (BID) | ORAL | Status: DC
  Administered 2011-11-20 – 2011-11-22 (×6): 0.5 mg via ORAL
  Administered 2011-11-22: 11:00:00 via ORAL
  Administered 2011-11-23: 0.5 mg via ORAL
  Filled 2011-11-20 (×2): qty 1
  Filled 2011-11-20: qty 2
  Filled 2011-11-20 (×3): qty 1
  Filled 2011-11-20: qty 2
  Filled 2011-11-20 (×2): qty 1

## 2011-11-20 MED ORDER — LORAZEPAM 1 MG PO TABS
1.0000 mg | ORAL_TABLET | Freq: Three times a day (TID) | ORAL | Status: DC | PRN
  Administered 2011-11-23: 1 mg via ORAL
  Filled 2011-11-20 (×2): qty 1

## 2011-11-20 MED ORDER — ESCITALOPRAM OXALATE 10 MG PO TABS
20.0000 mg | ORAL_TABLET | Freq: Every day | ORAL | Status: DC
  Administered 2011-11-20 – 2011-11-23 (×4): 20 mg via ORAL
  Filled 2011-11-20: qty 1
  Filled 2011-11-20: qty 2
  Filled 2011-11-20: qty 1
  Filled 2011-11-20 (×2): qty 2

## 2011-11-20 MED ORDER — NICOTINE 21 MG/24HR TD PT24
21.0000 mg | MEDICATED_PATCH | Freq: Every day | TRANSDERMAL | Status: DC
  Filled 2011-11-20 (×2): qty 1

## 2011-11-20 MED ORDER — ONDANSETRON HCL 4 MG PO TABS: 4.0000 mg | ORAL_TABLET | Freq: Three times a day (TID) | ORAL | Status: DC | PRN

## 2011-11-20 MED ORDER — ZOLPIDEM TARTRATE 5 MG PO TABS
5.0000 mg | ORAL_TABLET | Freq: Every evening | ORAL | Status: DC | PRN
  Administered 2011-11-21: 5 mg via ORAL
  Filled 2011-11-20 (×2): qty 1

## 2011-11-20 MED ORDER — DICLOFENAC SODIUM 50 MG PO TBEC
50.0000 mg | DELAYED_RELEASE_TABLET | Freq: Two times a day (BID) | ORAL | Status: DC
Start: 2011-11-20 — End: 2011-11-23
  Administered 2011-11-20 – 2011-11-23 (×8): 50 mg via ORAL
  Filled 2011-11-20 (×10): qty 1

## 2011-11-20 NOTE — ED Notes (Signed)
telepysch completed  

## 2011-11-20 NOTE — BH Assessment (Addendum)
Assessment Note   [name] is a 56 y.o. male who presents from [facility] and is IVC.  Pt had an appointment with [facility] for follow up medication mgt visit and told staff that he was not feeling any better from the previous week.  Pt told them he was still SI w/plan to get a motel room, purchase beer and overdose on meds with alcohol; also uses [facility]  Pt tells this Clinical research associate that he has SI thoughts daily and has tried to harm self "at least a dozen times" and has been inpt numerous times for SI and Depression.  Pt tells this Clinical research associate "there's no use in staying in the hospital all day" and can contract for safety and return to group home([facility]).  Pt stressors: Health(has MS, has weakness in legs and has to use a wheelchair or cane)--"I'm sick and tired of the whole deal"; No support.  Pt has had the same plan for sometime; get a motel room, purchase beer and overdose on pills with beer.  Pt says he only occasionally drinks when he considers SI.  Telepsych will be requested to determine final disposition.    Axis I: Major Depression, Recurrent severe Axis II: Deferred Axis III:  Past Medical History  Diagnosis Date  . Mental disorder   . Depression   . Neuromuscular disorder   . Multiple sclerosis, relapsing-remitting   . MS (multiple sclerosis)    Axis IV: other psychosocial or environmental problems, problems related to social environment and problems with primary support group Axis V: 31-40 impairment in reality testing  Past Medical History:  Past Medical History  Diagnosis Date  . Mental disorder   . Depression   . Neuromuscular disorder   . Multiple sclerosis, relapsing-remitting   . MS (multiple sclerosis)     Past Surgical History  Procedure Date  . Tonsillectomy     Family History: No family history on file.  Social History:  reports that he has been smoking Cigarettes.  He has a 18 pack-year smoking history. He does not have any smokeless tobacco history  on file. He reports that he drinks alcohol. He reports that he does not use illicit drugs.  Additional Social History:  Alcohol / Drug Use Pain Medications: None  Prescriptions: None  Over the Counter: None  History of alcohol / drug use?: No history of alcohol / drug abuse Longest period of sobriety (when/how long): None   CIWA: CIWA-Ar BP: 102/68 mmHg Pulse Rate: 65  COWS:    Allergies: No Known Allergies  Home Medications:  (Not in a hospital admission)  OB/GYN Status:  No LMP for male patient.  General Assessment Data Location of Assessment: [facility] ED Living Arrangements: Other (Comment) (Lives in Group Home---[facility]  ) Can pt return to current living arrangement?: Yes Admission Status: Involuntary Is patient capable of signing voluntary admission?: Yes Transfer from: Acute Hospital Referral Source: MD  Education Status Is patient currently in school?: No Current Grade: None  Highest grade of school patient has completed: GED Name of school: None  Contact person: None   Risk to self Suicidal Ideation: Yes-Currently Present Suicidal Intent: No-Not Currently/Within Last 6 Months Is patient at risk for suicide?: Yes Suicidal Plan?: Yes-Currently Present Specify Current Suicidal Plan: Overdose with meds and beer in hotel room  Access to Means: Yes Specify Access to Suicidal Means: Medications and Alcohol(when he pruchases it)  What has been your use of drugs/alcohol within the last 12 months?:  Pt denies regular use--only when SI present  Previous Attempts/Gestures: Yes How many times?: 12  Other Self Harm Risks: None  Triggers for Past Attempts: Other (Comment) (Health, Relational, "Life") Intentional Self Injurious Behavior: None Family Suicide History: No Recent stressful life event(s): Other (Comment) (Health Issues, "Life"---"Sick and tired of the whole deal". ) Persecutory voices/beliefs?: No Depression: Yes Depression Symptoms: Loss of interest in  usual pleasures;Feeling worthless/self pity;Fatigue Substance abuse history and/or treatment for substance abuse?: No Suicide prevention information given to non-admitted patients: Not applicable  Risk to Others Homicidal Ideation: No Thoughts of Harm to Others: No Current Homicidal Intent: No Current Homicidal Plan: No Access to Homicidal Means: No Identified Victim: None  History of harm to others?: No Assessment of Violence: None Noted Violent Behavior Description: None  Does patient have access to weapons?: No Criminal Charges Pending?: No Does patient have a court date: No  Psychosis Hallucinations: None noted Delusions: None noted  Mental Status Report Appear/Hygiene: Disheveled Eye Contact: Good Motor Activity: Unremarkable Speech: Logical/coherent Level of Consciousness: Alert Mood: Depressed;Sad Affect: Appropriate to circumstance;Depressed;Sad Anxiety Level: None Thought Processes: Coherent;Relevant Judgement: Impaired Orientation: Person;Place;Time;Situation Obsessive Compulsive Thoughts/Behaviors: None  Cognitive Functioning Concentration: Normal Memory: Recent Intact;Remote Intact IQ: Average Insight: Poor Impulse Control: Fair Appetite: Fair Weight Loss: 0  Weight Gain: 0  Sleep: Decreased Total Hours of Sleep: 5  Vegetative Symptoms: None  ADLScreening [facility] Assessment Services) Patient's cognitive ability adequate to safely complete daily activities?: Yes Patient able to express need for assistance with ADLs?: Yes Independently performs ADLs?: Yes  Abuse/Neglect [facility]) Physical Abuse: Denies Verbal Abuse: Denies Sexual Abuse: Denies  Prior Inpatient Therapy Prior Inpatient Therapy: Yes Prior Therapy Dates: 2000-2013 Prior Therapy Facilty/Provider(s): [facility], [facility], [facility], [facility]  Reason for Treatment: Depression, SI  Prior Outpatient Therapy Prior Outpatient Therapy: Yes Prior Therapy Dates: Current  Prior Therapy  Facilty/Provider(s): [facility]  Reason for Treatment: Med Mgt   ADL Screening (condition at time of admission) Patient's cognitive ability adequate to safely complete daily activities?: Yes Patient able to express need for assistance with ADLs?: Yes Independently performs ADLs?: Yes Weakness of Legs: Both Weakness of Arms/Hands: None  Home Assistive Devices/Equipment Home Assistive Devices/Equipment: Eyeglasses;Wheelchair;Cane (specify quad or straight)  Therapy Consults (therapy consults require a physician order) PT Evaluation Needed: No OT Evalulation Needed: No SLP Evaluation Needed: No Abuse/Neglect Assessment (Assessment to be complete while patient is alone) Physical Abuse: Denies Verbal Abuse: Denies Sexual Abuse: Denies Exploitation of patient/patient's resources: Denies Self-Neglect: Denies Values / Beliefs Cultural Requests During Hospitalization: None Spiritual Requests During Hospitalization: None Consults Spiritual Care Consult Needed: No Social Work Consult Needed: No Merchant navy officer (For Healthcare) Advance Directive: Patient does not have advance directive;Patient would not like information Pre-existing out of facility DNR order (yellow form or pink MOST form): No Nutrition Screen Diet: Cardiac Unintentional weight loss greater than 10lbs within the last month: No Problems chewing or swallowing foods and/or liquids: No Home Tube Feeding or Total Parenteral Nutrition (TPN): No Patient appears severely malnourished: No  Additional Information 1:1 In Past 12 Months?: No CIRT Risk: No Elopement Risk: No Does patient have medical clearance?: Yes     Disposition:  Disposition Disposition of Patient: Referred to (Telepsych ) Type of inpatient treatment program: Adult Patient referred to: Other (Comment) (Telepsych )  On Site Evaluation by:   Reviewed with Physician:     Murrell Redden 11/20/2011 6:16 AM

## 2011-11-20 NOTE — ED Notes (Signed)
Pt unable to go back to psych ed due to the fact that pt walks with a cane.

## 2011-11-20 NOTE — ED Notes (Signed)
Pt can go take a shower after housekeeping cleans the bathrooms.

## 2011-11-21 LAB — RAPID URINE DRUG SCREEN, HOSP PERFORMED
Amphetamines: NOT DETECTED
Barbiturates: POSITIVE — AB
Benzodiazepines: NOT DETECTED
Cocaine: NOT DETECTED
Opiates: NOT DETECTED
Tetrahydrocannabinol: NOT DETECTED

## 2011-11-21 NOTE — ED Provider Notes (Signed)
Pt without complaints this am.  Waiting for placement.  Filed Vitals:   11/21/11 0616  BP: 108/70  Pulse: 60  Temp: 97.8 F (36.6 C)  Resp: 18     Celene Kras, MD 11/21/11 [phone]

## 2011-11-22 NOTE — ED Provider Notes (Signed)
History     CSN: [id number]  Arrival date & time 11/19/11  1854   First MD Initiated Contact with Patient 11/19/11 1908      Chief Complaint  Patient presents with  . V70.1    (Consider location/radiation/quality/duration/timing/severity/associated sxs/prior treatment) HPI  Past Medical History  Diagnosis Date  . Mental disorder   . Depression   . Neuromuscular disorder   . Multiple sclerosis, relapsing-remitting   . MS (multiple sclerosis)     Past Surgical History  Procedure Date  . Tonsillectomy     No family history on file.  History  Substance Use Topics  . Smoking status: Current Everyday Smoker -- 0.5 packs/day for 36 years    Types: Cigarettes  . Smokeless tobacco: Not on file  . Alcohol Use: Yes     "hardly drink except when I get into a hotel room, probably every 6 months or so"      Review of Systems  Allergies  Review of patient's allergies indicates no known allergies.  Home Medications   Current Outpatient Rx  Name Route Sig Dispense Refill  . AMANTADINE HCL 100 MG PO CAPS Oral Take 100 mg by mouth 2 (two) times daily.    . ARIPIPRAZOLE 2 MG PO TABS Oral Take 2 mg by mouth 3 (three) times daily.     [name] BENZTROPINE MESYLATE 0.5 MG PO TABS Oral Take 1 tablet (0.5 mg total) by mouth 2 (two) times daily. 60 tablet 0  . DICLOFENAC SODIUM 50 MG PO TBEC Oral Take 50 mg by mouth 2 (two) times daily.    [name] ESCITALOPRAM OXALATE 10 MG PO TABS Oral Take 20 mg by mouth daily.    [name] ZOLPIDEM TARTRATE 10 MG PO TABS Oral Take 1 tablet (10 mg total) by mouth at bedtime as needed for sleep. 30 tablet 0    BP 107/66  Pulse 69  Temp 97.7 F (36.5 C) (Oral)  Resp 20  Wt 175 lb (79.379 kg)  SpO2 96%  Physical Exam  ED Course  Procedures (including critical care time)  Labs Reviewed  COMPREHENSIVE METABOLIC PANEL - Abnormal; Notable for the following:    GFR calc non Af Amer 66 (*)     GFR calc Af Amer 76 (*)     All other components within normal  limits  URINE RAPID DRUG SCREEN (HOSP PERFORMED) - Abnormal; Notable for the following:    Opiates POSITIVE (*)     Tetrahydrocannabinol POSITIVE (*)     Barbiturates POSITIVE (*)     All other components within normal limits  SALICYLATE LEVEL - Abnormal; Notable for the following:    Salicylate Lvl <2.0 (*)     All other components within normal limits  URINE RAPID DRUG SCREEN (HOSP PERFORMED) - Abnormal; Notable for the following:    Barbiturates POSITIVE (*)     All other components within normal limits  ETHANOL  CBC WITH DIFFERENTIAL  ACETAMINOPHEN LEVEL   No results found.   No diagnosis found.    MDM  Pt is awake, in bed, watching television - NAD.  Sitter is at bedside.  VS, labs, meds reviewed.  Pt is awaiting admission to psychiatric unit per tele-psych recommendations.        Tobin Chad, MD 11/22/11 [phone]

## 2011-11-23 NOTE — [facility] Counselor (Signed)
TC with Broadus Garfield @ [facility] and confirmed they have bed availability. Faxed info over for review. TC from Roberts @ [facility], who had a few questions about pt's mobility and ADLs. Faxed IVC paperwork to her. Completed first Examination, as this was not previously completed.  Pt previously declined @ [facility] by Dr. Dan Humphreys.

## 2011-11-23 NOTE — BH Assessment (Signed)
Assessment Note   [name] is an 56 y.o. male. Pt initially presented to [facility] via [facility] under IVC on 11/19/11. At that time, pt had an appointment with [facility] for follow up medication management visit and told staff he was not feeling any better from the previous week. Pt told them he was still SI w/ plan to get a motel room, purchase beer and OD on sleeping pills w/ ETOH. Pt also uses THC. Pt reports having SI daily and has tried to harm himself "at least a dozen times" and has been IPT multiple times for SI and depression. Pt stated "there's no use in staying in the hospital all day". Pt resides at [facility]. Pt has MS with weakness in his legs and must use a cane or wheelchair. Pt stated "I'm sick and tired of the whole deal". Pt has no support.  Pt accepted by Dr. Jessy Oto @ [facility]. Pt is IVC and completed first Examination. Updated RN and EDP. Pt will be transported via [facility].  Axis I: Major Depression, Recurrent severe Axis II: Deferred Axis III:  Past Medical History  Diagnosis Date  . Mental disorder   . Depression   . Neuromuscular disorder   . Multiple sclerosis, relapsing-remitting   . MS (multiple sclerosis)    Axis IV: other psychosocial or environmental problems, problems related to social environment and problems with primary support group Axis V: 31-40 impairment in reality testing  Past Medical History:  Past Medical History  Diagnosis Date  . Mental disorder   . Depression   . Neuromuscular disorder   . Multiple sclerosis, relapsing-remitting   . MS (multiple sclerosis)     Past Surgical History  Procedure Date  . Tonsillectomy     Family History: No family history on file.  Social History:  reports that he has been smoking Cigarettes.  He has a 18 pack-year smoking history. He does not have any smokeless tobacco history on file. He reports that he drinks alcohol. He reports that he does not use illicit drugs.  Additional Social  History:  Alcohol / Drug Use Pain Medications: None Prescriptions: None Over the Counter: None History of alcohol / drug use?: No history of alcohol / drug abuse Longest period of sobriety (when/how long): None   CIWA: CIWA-Ar BP: 106/70 mmHg Pulse Rate: 77  COWS:    Allergies: No Known Allergies  [name] Medications:  (Not in a hospital admission)  OB/GYN Status:  No LMP for male patient.  General Assessment Data [facility] Living Arrangements: Other (Comment) ([name]) Can pt return to current living arrangement?: Yes Admission Status: Involuntary Is patient capable of signing voluntary admission?: Yes Transfer from: Acute Hospital Referral Source: Self/Family/Friend  Education Status Is patient currently in school?: No Current Grade: N/A Highest grade of school patient has completed: GED Name of school: N/A Contact person: N/A  Risk to self Suicidal Ideation: Yes-Currently Present Suicidal Intent: No-Not Currently/Within Last 6 Months Is patient at risk for suicide?: Yes Suicidal Plan?: Yes-Currently Present Specify Current Suicidal Plan: OD on sleeping pills & ETOH Access to Means: Yes Specify Access to Suicidal Means: Rx, OTC, ETOH What has been your use of drugs/alcohol within the last 12 months?: Denies Previous Attempts/Gestures: Yes How many times?: 12  Other Self Harm Risks: None Triggers for Past Attempts: Unpredictable (Health, Relational, "Life") Intentional Self Injurious Behavior: None Family Suicide History: No Recent stressful life event(s): Other (Comment) (Health issues) Persecutory  voices/beliefs?: No Depression: Yes Depression Symptoms: Feeling angry/irritable;Feeling worthless/self pity;Fatigue Substance abuse history and/or treatment for substance abuse?: No Suicide prevention information given to non-admitted patients: Not applicable  Risk to Others Homicidal Ideation: No Thoughts of Harm to Others:  No Current Homicidal Intent: No Current Homicidal Plan: No Access to Homicidal Means: No Identified Victim: None History of harm to others?: No Assessment of Violence: None Noted Violent Behavior Description: None  Does patient have access to weapons?: No Criminal Charges Pending?: No Does patient have a court date: No  Psychosis Hallucinations: None noted Delusions: None noted  Mental Status Report Appear/Hygiene: Disheveled Eye Contact: Good Motor Activity: Unremarkable Speech: Logical/coherent Level of Consciousness: Alert Mood: Depressed Affect: Appropriate to circumstance;Depressed;Sad Anxiety Level: Minimal Thought Processes: Coherent;Relevant Judgement: Impaired Orientation: Person;Place;Time;Situation Obsessive Compulsive Thoughts/Behaviors: None  Cognitive Functioning Concentration: Normal Memory: Recent Intact;Remote Intact IQ: Average Insight: Poor Impulse Control: Fair Appetite: Fair Weight Loss: 0  Weight Gain: 0  Sleep: Increased Total Hours of Sleep: 5  Vegetative Symptoms: None  ADLScreening [facility] Assessment Services) Patient's cognitive ability adequate to safely complete daily activities?: Yes Patient able to express need for assistance with ADLs?: Yes Independently performs ADLs?: Yes  Abuse/Neglect [facility]) Physical Abuse: Denies Verbal Abuse: Denies Sexual Abuse: Denies  Prior Inpatient Therapy Prior Inpatient Therapy: Yes Prior Therapy Dates: 2000-2013 Prior Therapy Facilty/Provider(s): [facility], [place], [facility]  Reason for Treatment: Depression, SI  Prior Outpatient Therapy Prior Outpatient Therapy: Yes Prior Therapy Dates: Current  Prior Therapy Facilty/Provider(s): [facility]  Reason for Treatment: Med Mgt   ADL Screening (condition at time of admission) Patient's cognitive ability adequate to safely complete daily activities?: Yes Patient able to express need for assistance with ADLs?: Yes Independently performs ADLs?:  Yes Weakness of Legs: None Weakness of Arms/Hands: None  [name] Assistive Devices/Equipment [name] Assistive Devices/Equipment: Eyeglasses;Wheelchair;Cane (specify quad or straight)  Therapy Consults (therapy consults require a physician order) PT Evaluation Needed: No OT Evalulation Needed: No SLP Evaluation Needed: No Abuse/Neglect Assessment (Assessment to be complete while patient is alone) Physical Abuse: Denies Verbal Abuse: Denies Sexual Abuse: Denies Exploitation of patient/patient's resources: Denies Self-Neglect: Denies Values / Beliefs Cultural Requests During Hospitalization: None Spiritual Requests During Hospitalization: None Consults Spiritual Care Consult Needed: No Social Work Consult Needed: No Merchant navy officer (For Healthcare) Advance Directive: Patient does not have advance directive;Patient would not like information Pre-existing out of facility DNR order (yellow form or pink MOST form): No Nutrition Screen Diet: Regular Unintentional weight loss greater than 10lbs within the last month: No Problems chewing or swallowing foods and/or liquids: No [name] Tube Feeding or Total Parenteral Nutrition (TPN): No Patient appears severely malnourished: No  Additional Information 1:1 In Past 12 Months?: No CIRT Risk: No Elopement Risk: No Does patient have medical clearance?: Yes     Disposition:  Disposition Disposition of Patient: Referred to (Accepted to [facility] by Dr. Jessy Oto) Type of inpatient treatment program: Adult Patient referred to: Other (Comment) (Accepted to [facility] by Dr. Jessy Oto)  On Site Evaluation by:   Reviewed with Physician:     Romeo Apple 11/23/2011 11:56 AM

## 2011-11-23 NOTE — [facility] Counselor (Signed)
TC from [facility] @ [facility]. Pt accepted by Dr. Jessy Oto. RN report number is [phone]. Pt will be going to the [facility]. Updated RN & EDP.

## 2011-11-23 NOTE — ED Provider Notes (Signed)
Patient accepted at [facility] by Dr. Jessy Oto.  BP 106/70  Pulse 77  Temp 98.6 F (37 C) (Oral)  Resp 18  Wt 175 lb (79.379 kg)  SpO2 96%   Glynn Octave, MD 11/23/11 1152

## 2011-11-23 NOTE — ED Notes (Signed)
Pt with request for medicine to help him relax. Ativan 1mg  PO given per M.D. Order. Will continue to monitor per TCU protocol and M.D. Order.

## 2012-02-15 ENCOUNTER — Emergency Department (HOSPITAL_COMMUNITY)
Admission: EM | Admit: 2012-02-15 | Discharge: 2012-02-17 | Disposition: A | Discharge status: Other Acute Inpatient Hospital | Payer: Medicare Other | Attending: Emergency Medicine | Admitting: Emergency Medicine

## 2012-02-15 ENCOUNTER — Encounter (HOSPITAL_COMMUNITY): Payer: Self-pay | Admitting: Emergency Medicine

## 2012-02-15 DIAGNOSIS — F17200 Nicotine dependence, unspecified, uncomplicated: Secondary | ICD-10-CM | POA: Insufficient documentation

## 2012-02-15 DIAGNOSIS — F489 Nonpsychotic mental disorder, unspecified: Secondary | ICD-10-CM | POA: Insufficient documentation

## 2012-02-15 DIAGNOSIS — G35 Multiple sclerosis: Secondary | ICD-10-CM | POA: Insufficient documentation

## 2012-02-15 DIAGNOSIS — Z0289 Encounter for other administrative examinations: Secondary | ICD-10-CM | POA: Insufficient documentation

## 2012-02-15 DIAGNOSIS — Z79899 Other long term (current) drug therapy: Secondary | ICD-10-CM | POA: Insufficient documentation

## 2012-02-15 DIAGNOSIS — R45851 Suicidal ideations: Secondary | ICD-10-CM

## 2012-02-15 LAB — CBC
HCT: 44.4 % (ref 39.0–52.0)
Hemoglobin: 14.3 g/dL (ref 13.0–17.0)
MCH: 29.1 pg (ref 26.0–34.0)
MCHC: 32.2 g/dL (ref 30.0–36.0)
MCV: 90.4 fL (ref 78.0–100.0)
Platelets: 243 10*3/uL (ref 150–400)
RBC: 4.91 MIL/uL (ref 4.22–5.81)
RDW: 14.1 % (ref 11.5–15.5)
WBC: 6.9 10*3/uL (ref 4.0–10.5)

## 2012-02-15 LAB — COMPREHENSIVE METABOLIC PANEL
ALT: 16 U/L (ref 0–53)
AST: 15 U/L (ref 0–37)
Albumin: 4.4 g/dL (ref 3.5–5.2)
Alkaline Phosphatase: 68 U/L (ref 39–117)
BUN: 20 mg/dL (ref 6–23)
CO2: 26 mEq/L (ref 19–32)
Calcium: 9.5 mg/dL (ref 8.4–10.5)
Chloride: 100 mEq/L (ref 96–112)
Creatinine, Ser: 1.22 mg/dL (ref 0.50–1.35)
GFR calc Af Amer: 75 mL/min — ABNORMAL LOW (ref >90)
GFR calc non Af Amer: 65 mL/min — ABNORMAL LOW (ref >90)
Glucose, Bld: 84 mg/dL (ref 70–99)
Potassium: 4.1 mEq/L (ref 3.5–5.1)
Sodium: 135 mEq/L (ref 135–145)
Total Bilirubin: 0.4 mg/dL (ref 0.3–1.2)
Total Protein: 7.8 g/dL (ref 6.0–8.3)

## 2012-02-15 LAB — RAPID URINE DRUG SCREEN, HOSP PERFORMED
Amphetamines: NOT DETECTED
Barbiturates: NOT DETECTED
Benzodiazepines: NOT DETECTED
Cocaine: NOT DETECTED
Opiates: NOT DETECTED
Tetrahydrocannabinol: NOT DETECTED

## 2012-02-15 LAB — ACETAMINOPHEN LEVEL: Acetaminophen (Tylenol), Serum: <15 ug/mL (ref 10–30)

## 2012-02-15 LAB — ETHANOL: Alcohol, Ethyl (B): <11 mg/dL (ref 0–11)

## 2012-02-15 LAB — SALICYLATE LEVEL: Salicylate Lvl: <2 mg/dL — ABNORMAL LOW (ref 2.8–20.0)

## 2012-02-15 MED ORDER — NICOTINE 21 MG/24HR TD PT24: 21.0000 mg | MEDICATED_PATCH | Freq: Every day | TRANSDERMAL | Status: DC

## 2012-02-15 MED ORDER — LORAZEPAM 1 MG PO TABS
1.0000 mg | ORAL_TABLET | Freq: Three times a day (TID) | ORAL | Status: DC | PRN
  Administered 2012-02-16: 1 mg via ORAL
  Filled 2012-02-15: qty 1

## 2012-02-15 MED ORDER — ACETAMINOPHEN 325 MG PO TABS: 650.0000 mg | ORAL_TABLET | ORAL | Status: DC | PRN

## 2012-02-15 MED ORDER — ALUM & MAG HYDROXIDE-SIMETH 200-200-20 MG/5ML PO SUSP: 30.0000 mL | ORAL | Status: DC | PRN

## 2012-02-15 MED ORDER — IBUPROFEN 600 MG PO TABS: 600.0000 mg | ORAL_TABLET | Freq: Three times a day (TID) | ORAL | Status: DC | PRN

## 2012-02-15 MED ORDER — ONDANSETRON HCL 4 MG PO TABS: 4.0000 mg | ORAL_TABLET | Freq: Three times a day (TID) | ORAL | Status: DC | PRN

## 2012-02-15 NOTE — ED Provider Notes (Signed)
History     CSN: [id number]  Arrival date & time 02/15/12  1453   First MD Initiated Contact with Patient 02/15/12 1552      Chief Complaint  Patient presents with  . Medical Clearance    (Consider location/radiation/quality/duration/timing/severity/associated sxs/prior treatment) The history is provided by the patient.   patient here complaining of worsening depression with suicidal ideations with plan to take sleeping pills. Denies any current ingestions at this time. No auditory or visual hallucinations. Patient was brought in by police he is under IVC at this time. He denies any somatic complaints of at this time  Past Medical History  Diagnosis Date  . Mental disorder   . Depression   . Neuromuscular disorder   . Multiple sclerosis, relapsing-remitting   . MS (multiple sclerosis)     Past Surgical History  Procedure Date  . Tonsillectomy     No family history on file.  History  Substance Use Topics  . Smoking status: Current Every Day Smoker -- 0.5 packs/day for 36 years    Types: Cigarettes  . Smokeless tobacco: Not on file  . Alcohol Use: Yes     "hardly drink except when I get into a hotel room, probably every 6 months or so"      Review of Systems  All other systems reviewed and are negative.    Allergies  Review of patient's allergies indicates no known allergies.  Home Medications   Current Outpatient Rx  Name Route Sig Dispense Refill  . ARIPIPRAZOLE 2 MG PO TABS Oral Take 2 mg by mouth 3 (three) times daily.     . AMANTADINE HCL 100 MG PO CAPS Oral Take 100 mg by mouth 2 (two) times daily.    [name] BENZTROPINE MESYLATE 0.5 MG PO TABS Oral Take 1 tablet (0.5 mg total) by mouth 2 (two) times daily. 60 tablet 0  . DICLOFENAC SODIUM 50 MG PO TBEC Oral Take 50 mg by mouth 2 (two) times daily.    [name] ESCITALOPRAM OXALATE 10 MG PO TABS Oral Take 20 mg by mouth daily.    [name] ZOLPIDEM TARTRATE 10 MG PO TABS Oral Take 1 tablet (10 mg total) by mouth at  bedtime as needed for sleep. 30 tablet 0    BP 108/70  Pulse 86  Temp 98.7 F (37.1 C) (Oral)  Resp 18  SpO2 98%  Physical Exam  Nursing note and vitals reviewed. Constitutional: He is oriented to person, place, and time. He appears well-developed and well-nourished.  Non-toxic appearance. No distress.  HENT:  Head: Normocephalic and atraumatic.  Eyes: Conjunctivae normal, EOM and lids are normal. Pupils are equal, round, and reactive to light.  Neck: Normal range of motion. Neck supple. No tracheal deviation present. No mass present.  Cardiovascular: Normal rate, regular rhythm and normal heart sounds.  Exam reveals no gallop.   No murmur heard. Pulmonary/Chest: Effort normal and breath sounds normal. No stridor. No respiratory distress. He has no decreased breath sounds. He has no wheezes. He has no rhonchi. He has no rales.  Abdominal: Soft. Normal appearance and bowel sounds are normal. He exhibits no distension. There is no tenderness. There is no rebound and no CVA tenderness.  Musculoskeletal: Normal range of motion. He exhibits no edema and no tenderness.  Neurological: He is alert and oriented to person, place, and time. He has normal strength. No cranial nerve deficit or sensory deficit. GCS eye subscore is 4. GCS verbal subscore is 5. GCS motor subscore  is 6.  Skin: Skin is warm and dry. No abrasion and no rash noted.  Psychiatric: His speech is normal. His affect is blunt. He is slowed.    ED Course  Procedures (including critical care time)   Labs Reviewed  ACETAMINOPHEN LEVEL  CBC  COMPREHENSIVE METABOLIC PANEL  ETHANOL  SALICYLATE LEVEL  URINE RAPID DRUG SCREEN (HOSP PERFORMED)   No results found.   No diagnosis found.    MDM  Pt to be medically cleared and placed by act        Toy Baker, MD 02/15/12 [phone]

## 2012-02-15 NOTE — ED Notes (Signed)
Pt was brought to hospital by [employer] w/IVC papers. Pt sts he is SI and has a plan to take sleeping pills in a local hotel.VSS.PWD.

## 2012-02-16 LAB — LITHIUM LEVEL: Lithium Lvl: 0.36 mEq/L — ABNORMAL LOW (ref 0.80–1.40)

## 2012-02-16 MED ORDER — BUPROPION HCL ER (XL) 150 MG PO TB24
150.0000 mg | ORAL_TABLET | Freq: Every day | ORAL | Status: DC
  Administered 2012-02-16 – 2012-02-17 (×2): 150 mg via ORAL
  Filled 2012-02-16 (×3): qty 1

## 2012-02-16 MED ORDER — BENZTROPINE MESYLATE 1 MG PO TABS
0.5000 mg | ORAL_TABLET | Freq: Two times a day (BID) | ORAL | Status: DC
  Administered 2012-02-16 – 2012-02-17 (×3): 0.5 mg via ORAL
  Filled 2012-02-16 (×3): qty 1

## 2012-02-16 MED ORDER — AMANTADINE HCL 100 MG PO CAPS
100.0000 mg | ORAL_CAPSULE | Freq: Two times a day (BID) | ORAL | Status: DC
  Administered 2012-02-16 – 2012-02-17 (×3): 100 mg via ORAL
  Filled 2012-02-16 (×5): qty 1

## 2012-02-16 MED ORDER — BENZTROPINE MESYLATE 1 MG PO TABS: 0.5000 mg | ORAL_TABLET | Freq: Two times a day (BID) | ORAL | Status: DC

## 2012-02-16 MED ORDER — ZOLPIDEM TARTRATE 10 MG PO TABS: 10.0000 mg | ORAL_TABLET | Freq: Every evening | ORAL | Status: DC | PRN

## 2012-02-16 MED ORDER — BUPROPION HCL 100 MG PO TABS: 100.0000 mg | ORAL_TABLET | Freq: Every day | ORAL | Status: DC

## 2012-02-16 MED ORDER — ARIPIPRAZOLE 2 MG PO TABS
2.0000 mg | ORAL_TABLET | Freq: Every day | ORAL | Status: DC
  Administered 2012-02-16 – 2012-02-17 (×2): 2 mg via ORAL
  Filled 2012-02-16 (×2): qty 1

## 2012-02-16 MED ORDER — DIMETHYL FUMARATE 120 & 240 MG PO MISC
120.0000 mg | Freq: Two times a day (BID) | ORAL | Status: DC
  Filled 2012-02-16 (×2): qty 1

## 2012-02-16 MED ORDER — DIMETHYL FUMARATE 240 MG PO CPDR
240.0000 mg | DELAYED_RELEASE_CAPSULE | Freq: Two times a day (BID) | ORAL | Status: DC
  Administered 2012-02-16 – 2012-02-17 (×2): 240 mg via ORAL

## 2012-02-16 MED ORDER — LITHIUM CARBONATE 300 MG PO CAPS
300.0000 mg | ORAL_CAPSULE | Freq: Two times a day (BID) | ORAL | Status: DC
  Administered 2012-02-16 – 2012-02-17 (×3): 300 mg via ORAL
  Filled 2012-02-16 (×3): qty 1

## 2012-02-16 MED ORDER — ESCITALOPRAM OXALATE 10 MG PO TABS
20.0000 mg | ORAL_TABLET | Freq: Every day | ORAL | Status: DC
  Administered 2012-02-16 – 2012-02-17 (×2): 20 mg via ORAL
  Filled 2012-02-16 (×2): qty 2

## 2012-02-16 NOTE — ED Notes (Signed)
Sitting on his bed watching TV

## 2012-02-16 NOTE — ED Notes (Signed)
CSW spoke with [place] at [facility]. MD is reviewing the referral to determine if pt will be accepted or declined. CSW awaiting response.

## 2012-02-16 NOTE — ED Notes (Signed)
Sitting on bed watching TV

## 2012-02-16 NOTE — ED Notes (Signed)
Patient home medication taken to pharmacy Tecfidera 240 mg capsule 56 count.

## 2012-02-16 NOTE — ED Notes (Signed)
telepsych complete 

## 2012-02-16 NOTE — ED Notes (Signed)
Per ACT team, to complete a telepsych consult, will monitor.

## 2012-02-16 NOTE — BH Assessment (Signed)
Assessment Note   [name] is an 56 y.o. male who presents under IVC petition. Petition was taken out by [facility] who sent pt to [facility] for evaluation and placement. Pt is endorsing SI with a plan to overdose on sleeping medication and alcohol. Pt was unable to identify any precipitating event. He reports he has a history of depression due to chronic illness and pain. Pt has multiple sclerosis and states he has difficulty participating in activities he enjoys due to his illness. He is able to complete all of his ADLs independently. He reports no change is sleep or appetite.    He denies HI, [facility], and SA. He reports he lives at [facility] and has been there for the past 3 years. He states has attempted suicide several times in the past, stating "I couldn't say how many times." He has received inpatient treatment several times at [facility], [facility], [facility], and [facility]. He reports he does not currently have any outpatient providers.      Axis I: Major Depression, Recurrent severe Axis II: Deferred Axis III:  Past Medical History  Diagnosis Date  . Mental disorder   . Depression   . Neuromuscular disorder   . Multiple sclerosis, relapsing-remitting   . MS (multiple sclerosis)    Axis IV: other psychosocial or environmental problems and problems with primary support group Axis V: 31-40 impairment in reality testing  Past Medical History:  Past Medical History  Diagnosis Date  . Mental disorder   . Depression   . Neuromuscular disorder   . Multiple sclerosis, relapsing-remitting   . MS (multiple sclerosis)     Past Surgical History  Procedure Date  . Tonsillectomy     Family History: No family history on file.  Social History:  reports that he has been smoking Cigarettes.  He has a 18 pack-year smoking history. He does not have any smokeless tobacco history on file. He reports that he drinks alcohol. He reports that he does not use illicit drugs.  Additional Social History:   Alcohol / Drug Use History of alcohol / drug use?: Yes Substance #1 Name of Substance 1: alcohol 1 - Age of First Use: 20s 1 - Amount (size/oz): varries  1 - Frequency: infrequently  CIWA: CIWA-Ar BP: 118/50 mmHg Pulse Rate: 75  COWS:    Allergies: No Known Allergies  Home Medications:  (Not in a hospital admission)  OB/GYN Status:  No LMP for male patient.  General Assessment Data [facility] Living Arrangements: Other (Comment) [facility]) Can pt return to current living arrangement?: Yes Admission Status: Involuntary Is patient capable of signing voluntary admission?: Yes Transfer from: Acute Hospital Referral Source: MD  Education Status Is patient currently in school?: No  Risk to self Suicidal Ideation: Yes-Currently Present Suicidal Intent: Yes-Currently Present Is patient at risk for suicide?: Yes Suicidal Plan?: Yes-Currently Present Specify Current Suicidal Plan: plan to overdose Access to Means: Yes Specify Access to Suicidal Means: medication What has been your use of drugs/alcohol within the last 12 months?: denies Previous Attempts/Gestures: Yes How many times?:  (states several times) Other Self Harm Risks: none Triggers for Past Attempts: Unpredictable Intentional Self Injurious Behavior: None Family Suicide History: No Recent stressful life event(s):  (none noted) Persecutory voices/beliefs?: No Depression: Yes Depression Symptoms: Fatigue Substance abuse history and/or treatment for substance abuse?: No Suicide prevention information given to non-admitted patients: Not applicable  Risk to Others Homicidal Ideation: No Thoughts of Harm  to Others: No Current Homicidal Intent: No Current Homicidal Plan: No Access to Homicidal Means: No Identified Victim: none History of harm to others?: No Assessment of Violence: None Noted Violent Behavior Description: coopeartive Does patient have access to weapons?: No Criminal  Charges Pending?: No Does patient have a court date: No  Psychosis Hallucinations: None noted Delusions: None noted  Mental Status Report Appear/Hygiene: Other (Comment) (casual) Eye Contact: Fair Motor Activity: Unremarkable Speech: Logical/coherent Level of Consciousness: Quiet/awake Mood: Depressed Affect: Appropriate to circumstance Anxiety Level: None Thought Processes: Coherent;Relevant Judgement: Unimpaired Orientation: Person;Place;Time;Situation Obsessive Compulsive Thoughts/Behaviors: None  Cognitive Functioning Concentration: Normal Memory: Recent Intact;Remote Intact IQ: Average Insight: Fair Impulse Control: Fair Appetite: Fair Weight Loss: 0  Weight Gain: 0  Sleep: No Change Total Hours of Sleep: 8  Vegetative Symptoms: None  ADLScreening [facility] Assessment Services) Patient's cognitive ability adequate to safely complete daily activities?: Yes Patient able to express need for assistance with ADLs?: Yes Independently performs ADLs?: Yes (appropriate for developmental age)  Abuse/Neglect [facility]) Physical Abuse: Denies Verbal Abuse: Denies Sexual Abuse: Denies  Prior Inpatient Therapy Prior Inpatient Therapy: Yes Prior Therapy Dates: 2012, 2013 (multiple ) Prior Therapy Facilty/Provider([facility]): [facility] Reason for Treatment: depression and SI  Prior Outpatient Therapy Prior Outpatient Therapy: Yes Prior Therapy Dates: 2012 [facility] Facilty/Provider(s[facility] Reason for Treatment: depression  ADL Screening (condition at time of admission) Patient's cognitive ability adequate to safely complete daily activities?: Yes Patient able to express need for assistance with ADLs?: Yes Independently performs ADLs?: Yes (appropriate for developmental age) Weakness of Legs: None Weakness of Arms/Hands: None  Home Assistive Devices/Equipment Home Assistive Devices/Equipment: None    Abuse/Neglect Assessment (Assessment to be complete  while patient is alone) Physical Abuse: Denies Verbal Abuse: Denies Sexual Abuse: Denies Exploitation of patient/patient's resources: Denies Self-Neglect: Denies Values / Beliefs Cultural Requests During Hospitalization: None Spiritual Requests During Hospitalization: None   Advance Directives (For Healthcare) Advance Directive: Patient does not have advance directive;Patient would not like information Pre-existing out of facility DNR order (yellow form or pink MOST form): No Nutrition Screen- [facility] Patient's home diet: Regular Have you recently lost weight without trying?: No Have you been eating poorly because of a decreased appetite?: No Malnutrition Screening Tool Score: 0   Additional Information 1:1 In Past 12 Months?: No CIRT Risk: No Elopement Risk: No Does patient have medical clearance?: Yes     Disposition:  Disposition Disposition of Patient: Other dispositions (pending telepsych)  On Site Evaluation by:   Reviewed with Physician:     Georgina Quint A 02/16/2012 5:11 AM

## 2012-02-16 NOTE — ED Notes (Signed)
Rod at group home contacted and he will try to arainge for his medication to be brought to the ED

## 2012-02-16 NOTE — ED Notes (Signed)
Wellbutrin not given @ 10000-not verified by pharmacy,  Lithium held until level is back, dymethyl not availble yet per pharmacy

## 2012-02-16 NOTE — ED Notes (Signed)
Pt declined by physician at [facility] due to chronicity. Disposition pending.   Janann Colonel., MSW, [facility] Clinical Social Worker [phone]

## 2012-02-16 NOTE — ED Notes (Signed)
Up to the bathroom 

## 2012-02-16 NOTE — ED Notes (Signed)
Dr Rennis Chris aware of pts lithium level-ok to give as scheduled per dr j.

## 2012-02-16 NOTE — ED Notes (Signed)
Pharmacy is unable to locate dimethyl at any of the local pharmacy-pt will need to get it from home.  Will contact group home to try and arrainge

## 2012-02-16 NOTE — ED Notes (Signed)
Laying in bed watching TV.  

## 2012-02-16 NOTE — ED Notes (Signed)
CSW spoke with Jill Alexanders (Intake) Specialist On Call who reported that the reviewing physician is requesting to know if pt had any acute psychiatric hospitalizations since d/c from [facility] on 10/8. CSW spoke with pt by bedside to obtain information. Pt reported that he has not inquired any hospitalizations since that discharge. CSW reported this information to [place] who stated that he will let CSW know what the disposition is after passing the information along to the reviewing physician.   Janann Colonel., MSW, [facility] Clinical Social Worker [phone]

## 2012-02-16 NOTE — ED Notes (Signed)
Pt telepsych being completed.

## 2012-02-16 NOTE — ED Notes (Signed)
Up to the desk on the phone 

## 2012-02-16 NOTE — ED Notes (Signed)
Pharmacy reports that they do not have dimethy fumerate and requests that the pt bring his supply from home.  Pt reports that he does not have any way of getting the medication here.  Pharmacy is aware.

## 2012-02-16 NOTE — ED Notes (Signed)
Message left at group to call

## 2012-02-16 NOTE — ED Notes (Signed)
Up tot he bathroom to shower and change scrubs 

## 2012-02-16 NOTE — ED Provider Notes (Addendum)
Pt offers no complaint restig comforatbly  Doug Sou, MD 02/16/12 [phone]  telepsychiatry consult reviewed. inpt stay  Needed  Per psychiatrist  Doug Sou, MD 02/16/12 [phone]

## 2012-02-17 ENCOUNTER — Inpatient Hospital Stay (HOSPITAL_COMMUNITY)
Admission: AD | Admit: 2012-02-17 | Discharge: 2012-02-26 | DRG: 881 | Payer: 59 | Source: Ambulatory Visit | Attending: Psychiatry | Admitting: Psychiatry

## 2012-02-17 ENCOUNTER — Encounter (HOSPITAL_COMMUNITY): Payer: Self-pay

## 2012-02-17 DIAGNOSIS — F19929 Other psychoactive substance use, unspecified with intoxication, unspecified: Secondary | ICD-10-CM

## 2012-02-17 DIAGNOSIS — F3289 Other specified depressive episodes: Principal | ICD-10-CM | POA: Diagnosis present

## 2012-02-17 DIAGNOSIS — Z79899 Other long term (current) drug therapy: Secondary | ICD-10-CM

## 2012-02-17 DIAGNOSIS — G35 Multiple sclerosis: Secondary | ICD-10-CM | POA: Diagnosis present

## 2012-02-17 DIAGNOSIS — T438X5A Adverse effect of other psychotropic drugs, initial encounter: Secondary | ICD-10-CM | POA: Diagnosis present

## 2012-02-17 DIAGNOSIS — J9601 Acute respiratory failure with hypoxia: Secondary | ICD-10-CM

## 2012-02-17 DIAGNOSIS — G259 Extrapyramidal and movement disorder, unspecified: Secondary | ICD-10-CM | POA: Diagnosis present

## 2012-02-17 DIAGNOSIS — F341 Dysthymic disorder: Secondary | ICD-10-CM

## 2012-02-17 DIAGNOSIS — F32A Depression, unspecified: Secondary | ICD-10-CM

## 2012-02-17 DIAGNOSIS — J9600 Acute respiratory failure, unspecified whether with hypoxia or hypercapnia: Secondary | ICD-10-CM

## 2012-02-17 DIAGNOSIS — T50901A Poisoning by unspecified drugs, medicaments and biological substances, accidental (unintentional), initial encounter: Secondary | ICD-10-CM

## 2012-02-17 DIAGNOSIS — R45851 Suicidal ideations: Secondary | ICD-10-CM

## 2012-02-17 DIAGNOSIS — F4322 Adjustment disorder with anxiety: Secondary | ICD-10-CM | POA: Diagnosis present

## 2012-02-17 DIAGNOSIS — F329 Major depressive disorder, single episode, unspecified: Principal | ICD-10-CM | POA: Diagnosis present

## 2012-02-17 MED ORDER — BENZTROPINE MESYLATE 0.5 MG PO TABS
0.5000 mg | ORAL_TABLET | Freq: Two times a day (BID) | ORAL | Status: DC
  Administered 2012-02-18 – 2012-02-26 (×18): 0.5 mg via ORAL
  Filled 2012-02-17: qty 6
  Filled 2012-02-17 (×6): qty 1
  Filled 2012-02-17: qty 6
  Filled 2012-02-17 (×6): qty 1
  Filled 2012-02-17: qty 6
  Filled 2012-02-17 (×5): qty 1
  Filled 2012-02-17: qty 6
  Filled 2012-02-17 (×2): qty 1

## 2012-02-17 MED ORDER — LITHIUM CARBONATE 300 MG PO CAPS
300.0000 mg | ORAL_CAPSULE | Freq: Two times a day (BID) | ORAL | Status: DC
  Administered 2012-02-18 – 2012-02-20 (×6): 300 mg via ORAL
  Filled 2012-02-17 (×9): qty 1

## 2012-02-17 MED ORDER — MAGNESIUM HYDROXIDE 400 MG/5ML PO SUSP: 30.0000 mL | Freq: Every day | ORAL | Status: DC | PRN

## 2012-02-17 MED ORDER — ALUM & MAG HYDROXIDE-SIMETH 200-200-20 MG/5ML PO SUSP: 30.0000 mL | ORAL | Status: DC | PRN

## 2012-02-17 MED ORDER — ACETAMINOPHEN 325 MG PO TABS: 650.0000 mg | ORAL_TABLET | Freq: Four times a day (QID) | ORAL | Status: DC | PRN

## 2012-02-17 MED ORDER — ARIPIPRAZOLE 2 MG PO TABS
2.0000 mg | ORAL_TABLET | Freq: Every day | ORAL | Status: DC
  Administered 2012-02-18 – 2012-02-26 (×9): 2 mg via ORAL
  Filled 2012-02-17 (×10): qty 1
  Filled 2012-02-17 (×2): qty 3
  Filled 2012-02-17: qty 1

## 2012-02-17 MED ORDER — BUPROPION HCL ER (XL) 150 MG PO TB24
150.0000 mg | ORAL_TABLET | Freq: Every day | ORAL | Status: DC
  Administered 2012-02-18 – 2012-02-26 (×9): 150 mg via ORAL
  Filled 2012-02-17: qty 1
  Filled 2012-02-17: qty 3
  Filled 2012-02-17 (×8): qty 1
  Filled 2012-02-17: qty 3
  Filled 2012-02-17: qty 1

## 2012-02-17 MED ORDER — AMANTADINE HCL 100 MG PO CAPS
100.0000 mg | ORAL_CAPSULE | Freq: Two times a day (BID) | ORAL | Status: DC
  Administered 2012-02-18: 100 mg via ORAL
  Filled 2012-02-17 (×3): qty 1

## 2012-02-17 MED ORDER — ESCITALOPRAM OXALATE 20 MG PO TABS
20.0000 mg | ORAL_TABLET | Freq: Every day | ORAL | Status: DC
  Administered 2012-02-18 – 2012-02-19 (×2): 20 mg via ORAL
  Filled 2012-02-17 (×3): qty 1

## 2012-02-17 NOTE — ED Notes (Signed)
[employer] contacted for transport to behavioral health.
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
## 2012-02-17 NOTE — ED Notes (Signed)
ACT into see 

## 2012-02-17 NOTE — ED Notes (Signed)
Nad, watching tv, waiting for disposition

## 2012-02-17 NOTE — ED Notes (Signed)
Up to the bathroom 

## 2012-02-17 NOTE — Tx Team (Addendum)
Initial Interdisciplinary Treatment Plan  PATIENT STRENGTHS: (choose at least two) General fund of knowledge Motivation for treatment/growth  PATIENT STRESSORS: Health problems   PROBLEM LIST: Problem List/Patient Goals Date to be addressed Date deferred Reason deferred Estimated date of resolution  Depression      Suicidal Ideation                                                 DISCHARGE CRITERIA:  Improved stabilization in mood, thinking, and/or behavior Need for constant or close observation no longer present  PRELIMINARY DISCHARGE PLAN: Outpatient therapy  PATIENT/FAMIILY INVOLVEMENT: This treatment plan has been presented to and reviewed with the patient, [name], and/or family member.  The patient and family have been given the opportunity to ask questions and make suggestions.  [name] [facility] 02/17/2012, 9:52 PM

## 2012-02-17 NOTE — ED Provider Notes (Addendum)
On AM rounds the patient states that he is "doing OK."  VSS  Placement pending.  Gerhard Munch, MD 02/17/12 0940  3:19 PM Accepted by Daleen Bo > Laray Anger, MD 02/17/12 1520

## 2012-02-17 NOTE — ED Notes (Signed)
Patient discharged to [facility] with [employer] officers. NAD noted.

## 2012-02-17 NOTE — ED Notes (Signed)
Pt laying down watching TV 

## 2012-02-17 NOTE — ED Notes (Signed)
EDP into see 

## 2012-02-17 NOTE — ED Notes (Signed)
Up changing TV channel

## 2012-02-17 NOTE — ED Notes (Signed)
Up in room, watching tv 

## 2012-02-17 NOTE — Progress Notes (Signed)
Psychoeducational Group Note  Date:  02/17/2012 Time:  2000  Group Topic/Focus:   Wrap-Up Group:   The focus of this group is to help patients review their daily goal of treatment and discuss progress on daily workbooks.   Participation Level: Did Not Attend  Participation Quality:  Not Applicable  Affect:  Not Applicable  Cognitive:  Not Applicable  Insight:  Not Applicable  Engagement in Group: Not Applicable  Additional Comments:  The patient was admitted to the floor after the conclusion of group.   Hazle Coca S 02/17/2012, 10:36 PM

## 2012-02-17 NOTE — ED Notes (Signed)
Laying down in his bed watching TV

## 2012-02-17 NOTE — ED Notes (Signed)
bhh will call for report 

## 2012-02-18 DIAGNOSIS — F332 Major depressive disorder, recurrent severe without psychotic features: Secondary | ICD-10-CM

## 2012-02-18 LAB — LITHIUM LEVEL: Lithium Lvl: 0.71 mEq/L — ABNORMAL LOW (ref 0.80–1.40)

## 2012-02-18 MED ORDER — DIMETHYL FUMARATE 120 MG PO CPDR
1.0000 | DELAYED_RELEASE_CAPSULE | Freq: Two times a day (BID) | ORAL | Status: DC
  Administered 2012-02-19 – 2012-02-26 (×16): 1 via ORAL
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
## 2012-02-18 NOTE — Tx Team (Signed)
Interdisciplinary Treatment Plan Update (Adult)  Date:  02/18/2012  Time Reviewed:  10:21 AM   Progress in Treatment: Attending groups:   Yes   Participating in groups:  Yes Taking medication as prescribed:  Yes Tolerating medication:  Yes Family/Significant othe contact made: Contact to be made with Assisted Living Patient understands diagnosis:  Yes Discussing patient identified problems/goals with staff: Yes Medical problems stabilized or resolved: Yes Denies suicidal/homicidal ideation:Yes Issues/concerns per patient self-inventory:  Other:  New problem(s) identified:  Reason for Continuation of Hospitalization: Anxiety Depression Medication stabilization Suicidal ideation  Interventions implemented related to continuation of hospitalization:  Medication Management; safety checks q 15 mins  Additional comments:  Estimated length of stay: 1-2 days  Discharge Plan: Home with outpatient follow up at [facility]):  Review of initial/current patient goals per problem list:    1.  Goal(s): Eliminate SI/other thoughts of self harm   Met:  Yes  Target date: d/c  As evidenced by: Patient no longer endorsing SI/HI or other thoughts of self harm.    2.  Goal (s): Reduce depression/anxiety   Met:  No  Target date: d/c  As evidenced by: Patient will rate symptoms at four or below    3.  Goal(s): .stabilize on meds   Met:  No  Target date: d/c  As evidenced by: Patient reports being stabilized on medications - less symptomatic    4.  Goal(s): Refer for outpatient follow up   Met:  No  Target date: d/c  As evidenced by: Follow up appointment will be scheduled    Attendees: Patient:   02/18/2012 10:21 AM  Physican:  Geoffery Lyons, MD 02/18/2012 10:21 AM  Nursing:  Quintella Reichert, RN 02/18/2012 10:21 AM   Nursing:   Neill Loft, RN 02/18/2012 10:21 AM   Clinical Social Worker:  Juline Patch, LCSW 02/18/2012 10:21 AM   Other:  Norval Gable, FNP 02/18/2012 10:21 AM

## 2012-02-18 NOTE — Progress Notes (Signed)
Patient ID: [name], male   DOB: [birth year], 56 y.o.   MRN: [id number] D-Patient reports sleeping well and feeling depress at level 7. Continues to have feelings of hopelessness at 5. Denies SI/HI, Pain level @ 0 " I live with pain everyday. " A - Pt was vague regarding pain, offered support and encouragement R- OOB with encouragement from staff . Patient taking meds as ordered will continue to monitor on q15 checks.

## 2012-02-18 NOTE — Progress Notes (Signed)
Psychoeducational Group Note  Date:  02/18/2012 Time: 2000  Group Topic/Focus:  Wrap-Up Group:   The focus of this group is to help patients review their daily goal of treatment and discuss progress on daily workbooks.  Participation Level:  Minimal  Participation Quality:  Redirectable and Resistant  Affect:  Blunted  Cognitive:  Appropriate  Insight:  Limited  Engagement in Group:  Limited  Additional Comments:  Patient shared that he does not know why he keeps thinking "dumb and insane and stupid stuff" but that he hopes he can make himself stop.  Prakash Kimberling, [place] Pigg 02/18/2012, 9:37 PM

## 2012-02-18 NOTE — Progress Notes (Signed)
Psychoeducational Group Note  Date:  02/18/2012 Time:  1100  Group Topic/Focus:  Self Care:   The focus of this group is to help patients understand the importance of self-care in order to improve or restore emotional, physical, spiritual, interpersonal, and financial health.  Participation Level:  Active  Participation Quality:  Appropriate, Sharing and Supportive  Affect:  Appropriate  Cognitive:  Appropriate  Insight:  Good  Engagement in Group:  Good  Additional Comments:  none  Diannah Rindfleisch M 02/18/2012, 1:37 PM

## 2012-02-18 NOTE — Progress Notes (Signed)
[facility] Group Notes:  (Counselor/Nursing/MHT/Case Management/Adjunct)  02/18/2012 2:40 PM  Type of Therapy:  Group Therapy  Overcoming Obstacles   Participation Level:  Active  Participation Quality:  Appropriate, Attentive, Redirectable, Sharing and Supportive  Affect:  Appropriate, Blunted and Excited  Cognitive:  Alert, Appropriate and Oriented  Insight:  Good  Engagement in Group:  Good  Engagement in Therapy:  Good  Modes of Intervention:  Problem-solving, Socialization and Support  Summary of Progress/Problems:  [place] was very active in todays group discussing his obstacles and working to understand why he has had so many admissions into the behavioral health hospital.  Landis was very insightful to report most of his problems started when his wife left him and they were divorced. Delaine was able to report his love for nature and being outside.  Levorn reported he is still working through why he continues to feel suicidal and attempting suicide. Bo continues to work through and understand the barrier of overcoming his past and learning understand his obstacles.  Will continue to work with Jonny Ruiz on understanding his barriers and preventing future relapse.   Nail, Catalina Gravel 02/18/2012, 2:40 PM

## 2012-02-18 NOTE — Progress Notes (Signed)
Pt aleep.  Respirations even and unlabored.  Q15 minute safety checks maintained. Dion Saucier RN
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
## 2012-02-19 MED ORDER — ESCITALOPRAM OXALATE 20 MG PO TABS: 20.0000 mg | ORAL_TABLET | Freq: Every day | ORAL | Status: DC

## 2012-02-19 MED ORDER — DIMETHYL FUMARATE 120 MG PO CPDR: 1.0000 | DELAYED_RELEASE_CAPSULE | Freq: Two times a day (BID) | ORAL | Status: DC

## 2012-02-19 MED ORDER — LITHIUM CARBONATE 300 MG PO CAPS: 300.0000 mg | ORAL_CAPSULE | Freq: Two times a day (BID) | ORAL | Status: DC

## 2012-02-19 MED ORDER — ZOLPIDEM TARTRATE 10 MG PO TABS: 10.0000 mg | ORAL_TABLET | Freq: Every evening | ORAL | Status: DC | PRN

## 2012-02-19 MED ORDER — ARIPIPRAZOLE 2 MG PO TABS: 2.0000 mg | ORAL_TABLET | Freq: Every day | ORAL | Status: DC

## 2012-02-19 MED ORDER — BENZTROPINE MESYLATE 0.5 MG PO TABS: 0.5000 mg | ORAL_TABLET | Freq: Two times a day (BID) | ORAL | Status: DC

## 2012-02-19 MED ORDER — ESCITALOPRAM OXALATE 10 MG PO TABS
10.0000 mg | ORAL_TABLET | Freq: Every day | ORAL | Status: DC
  Administered 2012-02-20 – 2012-02-26 (×7): 10 mg via ORAL
  Filled 2012-02-19: qty 3
  Filled 2012-02-19 (×6): qty 1
  Filled 2012-02-19: qty 3
  Filled 2012-02-19 (×3): qty 1

## 2012-02-19 MED ORDER — BUPROPION HCL ER (XL) 150 MG PO TB24: 150.0000 mg | ORAL_TABLET | Freq: Every day | ORAL | Status: DC

## 2012-02-19 NOTE — Progress Notes (Signed)
Patient has been up in the dayroom, attended and participated in group this evening and has voiced no complaints. Patient currently denies having pain, -si/hi/a/v hall. Patient plans to return to previous living residence where he reports that he and four other men live. Support and encouragement offered, safety maintained on unit, will continue to monitor.

## 2012-02-19 NOTE — Progress Notes (Signed)
[facility] Group Notes:  (Counselor/Nursing/MHT/Case Management/Adjunct)  02/19/2012 4:23 PM  Type of Therapy:  Group Therapy  Participation Level:  Active  Participation Quality:  Appropriate and Attentive  Affect:  Depressed  Cognitive:  Appropriate  Insight:  Good  Engagement in Group:  Good  Engagement in Therapy:  Good  Modes of Intervention:  Problem-solving, Support and exploration  Summary of Progress/Problems:  Govani participated in group.  He shared it was a relief to know what was going on with him.     [name] 02/19/2012, 4:23 PM

## 2012-02-19 NOTE — Tx Team (Addendum)
Interdisciplinary Treatment Plan Update (Adult)  Date:  02/19/2012  Time Reviewed:  9:40 AM   Progress in Treatment: Attending groups:   Yes   Participating in groups:  Yes Taking medication as prescribed:  Yes Tolerating medication:  Yes Family/Significant othe contact made: Contact to be made with Ms. Davis at [facility] prior to discharge Patient understands diagnosis:  Yes Discussing patient identified problems/goals with staff: Yes Medical problems stabilized or resolved: Yes Denies suicidal/homicidal ideation:Yes Issues/concerns per patient self-inventory:  Other:  New problem(s) identified:  Reason for Continuation of Hospitalization: Depression Medication stabilization  Interventions implemented related to continuation of hospitalization:  Medication Management; safety checks q 15 mins  Additional comments:  Estimated length of stay: 1-2 days  Discharge Plan: Home with outpatient follow up at   [facility]):  Review of initial/current patient goals per problem list:    1.  Goal(s): Eliminate SI/other thoughts of self harm   Met:  Yes  Target date: d/c  As evidenced by: Patient no longer endorses SI/other thought self harm    2.  Goal (s): Reduce depression (rated at seven-eight today)   Met:  No  Target date: d/c  As evidenced by: Patient will rate symptoms at four or below    3.  Goal(s): .stabilize on meds   Met:  No  Target date: d/c  As evidenced by: Patient reports being stabilized on medications - less symptomatic    4.  Goal(s): Refer for outpatient follow up   Met:  Yes  Target date: d/c  As evidenced by: Follow up appointment scheduled    Attendees: Patient:  [name] 02/19/2012 9:40 AM  Physican:  Patrick North, MD 02/19/2012 9:40 AM  Nursing:  Neill Loft, RN 02/19/2012 9:40 AM   Nursing:   Harold Barban, RN 02/19/2012 9:40 AM   Clinical Social Worker:  Juline Patch, LCSW 02/19/2012 9:40 AM   Other: Norval Gable, FNP 02/19/2012 9:40 AM   Other:  Dennison Nancy, RN      02/19/2012 9:41 AM

## 2012-02-19 NOTE — Progress Notes (Signed)
[facility] MD Progress Note  02/19/2012 1:17 PM [name]  MRN:  [id number]  S: Patient seen this morning. He reports feeling depressed and having suicidal thoughts, unable to contract for safety. He is complaining of tremors in his hands that is a new symptom.  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 Mental disorder   . Depression   . Neuromuscular disorder   . Multiple sclerosis, relapsing-remitting   . MS (multiple sclerosis)    Axis IV: occupational problems and other psychosocial or environmental problems Axis V: 41-50 serious symptoms  ADL's:  Impaired  Sleep: Fair  Appetite:  Fair  Suicidal Ideation:    Mental Status Examination/Evaluation: Objective:  Appearance: Disheveled  Eye Contact::  Fair  Speech:  Garbled  Volume:  Normal  Mood:  Depressed and Hopeless  Affect:  Appropriate  Thought Process:  Coherent  Orientation:  Full  Thought Content:  WDL  Suicidal Thoughts:  Yes.  without intent/plan  Homicidal Thoughts:  No  Memory:  Immediate;   Poor Recent;   Poor Remote;   Poor  Judgement:  Impaired  Insight:  Fair  Psychomotor Activity:  Decreased  Concentration:  Fair  Recall:  Poor  Akathisia:  Yes  Handed:  Right  AIMS (if indicated):     Assets:  Desire for Improvement  Sleep:  Number of Hours: 6.5    Vital Signs:Blood pressure 95/65, pulse 88, temperature 97.9 F (36.6 C), temperature source Oral, resp. rate 17, height 5\' 11"  (1.803 m), weight 83.462 kg (184 lb). Current Medications: Current Facility-Administered Medications  Medication Dose Route Frequency Provider Last Rate Last Dose  . acetaminophen (TYLENOL) tablet 650 mg  650 mg Oral Q6H PRN Caroleen Hamman, NP      . alum & mag hydroxide-simeth (MAALOX/MYLANTA) 200-200-20 MG/5ML suspension 30 mL  30 mL Oral Q4H PRN Caroleen Hamman, NP      . ARIPiprazole (ABILIFY) tablet 2 mg  2  mg Oral Daily Shuvon Rankin, NP   2 mg at 02/19/12 0819  . benztropine (COGENTIN) tablet 0.5 mg  0.5 mg Oral BID Shuvon Rankin, NP   0.5 mg at 02/19/12 0819  . buPROPion (WELLBUTRIN XL) 24 hr tablet 150 mg  150 mg Oral Daily Shuvon Rankin, NP   150 mg at 02/19/12 0820  . Dimethyl Fumarate CPDR 1 tablet  1 tablet Oral BID Mojeed Akintayo   1 tablet at 02/19/12 1145  . escitalopram (LEXAPRO) tablet 10 mg  10 mg Oral Daily Riddhi Grether, MD      . lithium carbonate capsule 300 mg  300 mg Oral BID WC Shuvon Rankin, NP   300 mg at 02/19/12 0821  . magnesium hydroxide (MILK OF MAGNESIA) suspension 30 mL  30 mL Oral Daily PRN Caroleen Hamman, NP      . DISCONTD: escitalopram (LEXAPRO) tablet 20 mg  20 mg Oral Daily Shuvon Rankin, NP   20 mg at 02/19/12 0820    Lab Results:  Results for orders placed during the hospital encounter of 02/17/12 (from the past 48 hour(s))  LITHIUM LEVEL     Status: Abnormal   Collection Time   02/18/12  7:46 PM      Component Value Range Comment   Lithium Lvl 0.71 (*) 0.80 - 1.40 mEq/L     Physical Findings: AIMS: Facial and Oral Movements Muscles of Facial Expression: None, normal  Lips and Perioral Area: None, normal Jaw: None, normal Tongue: None, normal,Extremity Movements Upper (arms, wrists, hands, fingers): None, normal Lower (legs, knees, ankles, toes): None, normal, Trunk Movements Neck, shoulders, hips: None, normal, Overall Severity Severity of abnormal movements (highest score from questions above): None, normal Incapacitation due to abnormal movements: None, normal Patient's awareness of abnormal movements (rate only patient's report): No Awareness, Dental Status Current problems with teeth and/or dentures?: No Does patient usually wear dentures?: No  CIWA:    COWS:     Treatment Plan Summary: Daily contact with patient to assess and evaluate symptoms and progress in treatment Medication management  Plan: Patient has poor recollection of his  medications, the dosages and length of time he has been taking them. His tremor could be a result of akathisia and drug interactions or due to his MS. Will decrease Lexapro to 10mg  po qd. Continue to monitor.  Halayna Blane 02/19/2012, 1:17 PM

## 2012-02-19 NOTE — Progress Notes (Signed)
[facility] Group Notes:  (Counselor/Nursing/MHT/Case Management/Adjunct)  02/19/2012 10:02 PM  Type of Therapy:  Psychoeducational Skills  Participation Level:  Active  Participation Quality:  Appropriate  Affect:  Appropriate  Cognitive:  Appropriate  Insight:  Good  Engagement in Group:  Good  Engagement in Therapy:  Good  Modes of Intervention:  Problem-solving  Summary of Progress/Problems: Patience was cooperative and provided feedback to his peer during group.     Annell Greening [place] 02/19/2012, 10:02 PM

## 2012-02-19 NOTE — Progress Notes (Addendum)
D:  Patient's self inventory sheet, patient sleeps well, has good appetite, normal energy level, improving attention span.  Rated depression #17.  No rating for hopelessness.  No withdrawals.   SI off/on, contracts for safety.  No physical problems in past 24 hours.  Zero pain goal, zero pain.  Not sure how to take better care for himself after discharge.  No questions for staff.  Will return to [facility] after discharge.  No problems taking meds after discharge. A:  Medications given per MD order.  Support and encouragement given throughout day.  Support and safety checks completed as ordered. R:  Following treatment plan.   Denied HI.   Denied A/V hallucinations.  SI off/on, contracts for safety this morning.  Patient remains safe and receptive on unit.  Patient talked with MD and could not contract for safety.  Patient did contract for safety with nurse earlier this morning. Patient uses cane to ambulate.   Patient attended groups today.

## 2012-02-19 NOTE — Progress Notes (Signed)
Adult Psychosocial Assessment Update Interdisciplinary Team  [facility] admissions[facility]discharges:  Admissions Discharges  Date:  10/06/2011 Date: 10/11/2011  Date: Date:  Date: Date:  Date: Date:  Date: Date:   Changes since the last Psychosocial Assessment (including adherence to outpatient mental health and/or substance abuse treatment, situational issues contributing to decompensation and/or relapse).     Patient reports there have been no changes since his last admission in June 2013.         Discharge Plan 1. Will you be returning to the same living situation after discharge?   Yes: No:      If no, what is your plan?        Patient will return to [facility]     2. Would you like a referral for services when you are discharged? Yes:     If yes, for what services?  No:         He is followed outpatient by [facility].       Summary and Recommendations (to be completed by the evaluator)   [name] is a 56 year old Caucasian male who admitted with MDD and SI.   He will Benefit from crisis stabilization, evaluation for medication, psycho-education groups for coping skills development, group therapy and case management for discharge planning.                    Signature:  [name], 02/19/2012 1:03 PM

## 2012-02-19 NOTE — Progress Notes (Signed)
Grief and Loss Group  Group focused on issues ranging from a sense of loss of self to losses related to separation, suicide of family members, absence of parental nurturing. There was some discussion related to coping with grief in healthy ways, ability to forgive self and being open to beginning again or granting self the right to change.   Pt attentive but quiet.  Zyir Gassert E Chaplain  
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
## 2012-02-19 NOTE — Progress Notes (Signed)
Psychoeducational Group Note  Date:  02/19/2012 Time:  1100  Group Topic/Focus:  Recovery Goals:   The focus of this group is to identify appropriate goals for recovery and establish a plan to achieve them.  Participation Level:  Active  Participation Quality:  Appropriate and Attentive  Affect:  Appropriate  Cognitive:  Alert, Appropriate and Oriented  Insight:  Good  Engagement in Group:  Good  Additional Comments:  Pt. Identified  the changes they want to make for recovery and established goals in order to make those changes.   Ruta Hinds [facility] 02/19/2012, 12:58 PM

## 2012-02-20 MED ORDER — LITHIUM CARBONATE 300 MG PO CAPS
300.0000 mg | ORAL_CAPSULE | Freq: Every day | ORAL | Status: DC
  Administered 2012-02-21 – 2012-02-26 (×6): 300 mg via ORAL
  Filled 2012-02-20 (×7): qty 1

## 2012-02-20 NOTE — Progress Notes (Signed)
Psychoeducational Group Note  Date:  02/20/2012 Time:  1100  Group Topic/Focus:  Personal Choices and Values:   The focus of this group is to help patients assess and explore the importance of values in their lives, how their values affect their decisions, how they express their values and what opposes their expression.  Participation Level:  Active  Participation Quality:  Appropriate, Attentive and Sharing  Affect:  Appropriate  Cognitive:  Appropriate  Insight:  Good  Engagement in Group:  Good  Additional Comments:    In group of Identifying values and choices, patient participated and shared personal definition on what values meant and how values and choices affect personal life. Patient also gave examples of how values could change throughout our lifespan. Patient was given the opportunity to go through identifying values form in workbook and state what values were important in patient life. Patient then completed choosing a value-oriented life worksheet in relation to identifying values form.  [name] 02/20/2012, 2:11 PM

## 2012-02-20 NOTE — Progress Notes (Signed)
  D) Patient quiet but cooperative upon my assessment. Patient states slept "well," and  appetite is "good." Patient rates depression as   5/10. Patient endorses passive SI, contracts verbally for safety with RN. Patient denies HI, denies A/V hallucinations.   A) Patient offered support and encouragement, patient encouraged to discuss feelings/concerns with staff. Patient verbalized understanding. Patient monitored Q15 minutes for safety. Patient met with MD to discuss today's goals and plan of care.  R) Patient active on unit, attending groups in day room and meals in dining room.  Patient taking medications as ordered. Will continue to monitor.

## 2012-02-20 NOTE — Progress Notes (Signed)
Psychoeducational Group Note  Date:  02/20/2012 Time: 2000  Group Topic/Focus:  Wrap-Up Group:   The focus of this group is to help patients review their daily goal of treatment and discuss progress on daily workbooks.  Participation Level:  Minimal  Participation Quality:  Sharing  Affect:  Flat  Cognitive:  Appropriate  Insight:  Limited  Engagement in Group:  Limited  Additional Comments:  Patient shared that he was planning on going home tomorrow and that his day was not very eventful.  Ember Henrikson, Newton Pigg 02/20/2012, 10:25 PM

## 2012-02-20 NOTE — Progress Notes (Signed)
Patient ID: [name], male   DOB: 1955/12/27, 56 y.o.   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 Mental disorder   . Depression   . Neuromuscular disorder   . Multiple sclerosis, relapsing-remitting   . MS (multiple sclerosis)    Axis IV: occupational problems and other psychosocial or environmental problems Axis V: 41-50 serious symptoms   Subjective: S: "My mood is up and down. Its halfway descent at times, then at others I dont know." Pt states sometimes has difficulty saying the words he wants to use and sometimes tremors worse than at other times.   ADL's:  Impaired Less than fair/disheveled  Sleep: Fair "I slept well" i woke up about 4:15 am and went to bed at 9:30Pm."   Appetite:  Fair "Ive been eating most my food."  Suicidal Ideation:  "I have suicidal thoughts, but I know this is a safe place. I occasionally have thoughts of hurting myself."  Review of Systems  Constitutional: Negative for fever, chills and diaphoresis.       Elderly male with unstable ambulation (uses cane).  HENT: Positive for tinnitus. Negative for ear pain.   Cardiovascular: Negative for chest pain.  Gastrointestinal: Negative for nausea, vomiting, abdominal pain, diarrhea and constipation.  Musculoskeletal: Negative for falls.  Neurological: Positive for tremors and speech change. Negative for dizziness, sensory change, weakness and headaches.  Psychiatric/Behavioral: Positive for depression and suicidal ideas. Negative for hallucinations.     Mental Status Examination/Evaluation: Objective:  Appearance: Disheveled  Eye Contact::  Fair  Speech:  Have to focus to understand. Not clear; Delayed response time.  Volume:  Normal  Mood:  Depressed   Affect:  Appropriate  Thought  Process:  Coherent  Orientation:  Full  Thought Content:  Poverty; Negative for A/V hallucinations  Suicidal Thoughts:  Yes.  without intent/plan  Homicidal Thoughts:  No  Memory:  Immediate;   Poor Recent;   Poor Remote;   Poor  Judgement:  Impaired  Insight:  Fair  Psychomotor Activity:  Decreased  Concentration:  Fair  Recall:  Limited  Akathisia:  Yes  Handed:  Right  AIMS (if indicated):     Assets:  Desire for Improvement  Sleep:  Number of Hours: 6    Physical Exam  Constitutional: He is oriented to person, place, and time and well-developed, well-nourished, and in no distress.  HENT:  Head: Normocephalic.  Right Ear: External ear normal.  Left Ear: External ear normal.  Pulmonary/Chest: No accessory muscle usage. Not tachypneic. No respiratory distress.  Musculoskeletal: He exhibits no edema.  Neurological: He is alert and oriented to person, place, and time. Weakness: generalized muscle weakness. Coordination (gate is unstable. Uses cane for stability.) abnormal.       Tremor is less noticeable today.  Skin: Skin is warm and dry.     Vital Signs:Blood pressure 94/60, pulse 96, temperature 97.6 F (36.4 C), temperature source Oral, resp. rate 18, height 5\' 11"  (1.803 m), weight 83.462 kg (184 lb).   Current Medications: Current Facility-Administered Medications  Medication Dose Route Frequency Provider Last Rate Last Dose  . acetaminophen (TYLENOL) tablet 650 mg  650 mg Oral Q6H PRN Caroleen Hamman, NP      . alum & mag hydroxide-simeth (MAALOX/MYLANTA) 200-200-20  MG/5ML suspension 30 mL  30 mL Oral Q4H PRN Caroleen Hamman, NP      . ARIPiprazole (ABILIFY) tablet 2 mg  2 mg Oral Daily Shuvon Rankin, NP   2 mg at 02/20/12 0744  . benztropine (COGENTIN) tablet 0.5 mg  0.5 mg Oral BID Shuvon Rankin, NP   0.5 mg at 02/20/12 1657  . buPROPion (WELLBUTRIN XL) 24 hr tablet 150 mg  150 mg Oral Daily Shuvon Rankin, NP   150 mg at 02/20/12 0744  . Dimethyl Fumarate CPDR 1  tablet  1 tablet Oral BID Mojeed Akintayo   1 tablet at 02/20/12 1658  . escitalopram (LEXAPRO) tablet 10 mg  10 mg Oral Daily Kaleeya Hancock, MD   10 mg at 02/20/12 0824  . lithium carbonate capsule 300 mg  300 mg Oral Q breakfast Norval Gable, NP      . magnesium hydroxide (MILK OF MAGNESIA) suspension 30 mL  30 mL Oral Daily PRN Caroleen Hamman, NP      . DISCONTD: lithium carbonate capsule 300 mg  300 mg Oral BID WC Shuvon Rankin, NP   300 mg at 02/20/12 1657    Lab Results:  Results for orders placed during the hospital encounter of 02/17/12 (from the past 48 hour(s))  LITHIUM LEVEL     Status: Abnormal   Collection Time   02/18/12  7:46 PM      Component Value Range Comment   Lithium Lvl 0.71 (*) 0.80 - 1.40 mEq/L     Physical Findings: AIMS: Facial and Oral Movements Muscles of Facial Expression: None, normal Lips and Perioral Area: None, normal Jaw: None, normal Tongue: None, normal, Extremity Movements Upper (arms, wrists, hands, fingers): None, normal Lower (legs, knees, ankles, toes): None, normal,  Trunk Movements Neck, shoulders, hips: None, normal,  Overall Severity Severity of abnormal movements (highest score from questions above): tremors Incapacitation due to abnormal movements: None, normal Patient's awareness of abnormal movements (rate only patient's report): yes  Dental Status Current problems with teeth and/or dentures?: No Does patient usually wear dentures?: No  CIWA:  CIWA-Ar Total: 2  COWS:  COWS Total Score: 2   Treatment Plan Summary: Daily contact with patient to assess and evaluate symptoms and progress in treatment Medication management Continue groups and therapeutic milieu  Plan: Will order CMP to evaluate status. Decreased lithium to 300mg  QD, for gradual reduction. Will continue all other meds as ordered.    Norval Gable FNP-BC 02/20/2012, 6:01 PM  Reviewed, all recommendations discussed and approved.

## 2012-02-20 NOTE — Progress Notes (Signed)
[facility] Group Notes:  (Counselor/Nursing/MHT/Case Management/Adjunct)     Emotional Regulation 1:15 - 2:30 PM      October 30. 2013     Discharge Planning Group 8:30 - 9: 30 AM  02/20/2012 3:58 PM  Type of Therapy:  Group Therapy  Participation Level:  Minimal  Participation Quality:  Appropriate and Attentive  Affect:  Appropriate and Depressed  Cognitive:  Appropriate  Insight:  Limited  Engagement in Group:  Limited  Engagement in Therapy:  Limited  Modes of Intervention:  Problem-solving, Support and Exploration  Summary of Progress/Problems:  [name] attended group but had limited participation.  [name] attended discharge planning group.  He denies SI/HI and is hopeful to discharge soon.   [name] 02/20/2012, 3:58 PM

## 2012-02-21 LAB — COMPREHENSIVE METABOLIC PANEL
ALT: 18 U/L (ref 0–53)
AST: 15 U/L (ref 0–37)
Albumin: 3.7 g/dL (ref 3.5–5.2)
Alkaline Phosphatase: 61 U/L (ref 39–117)
BUN: 17 mg/dL (ref 6–23)
CO2: 27 mEq/L (ref 19–32)
Calcium: 9.4 mg/dL (ref 8.4–10.5)
Chloride: 105 mEq/L (ref 96–112)
Creatinine, Ser: 1.11 mg/dL (ref 0.50–1.35)
GFR calc Af Amer: 84 mL/min — ABNORMAL LOW (ref >90)
GFR calc non Af Amer: 72 mL/min — ABNORMAL LOW (ref >90)
Glucose, Bld: 87 mg/dL (ref 70–99)
Potassium: 4.5 mEq/L (ref 3.5–5.1)
Sodium: 140 mEq/L (ref 135–145)
Total Bilirubin: 0.2 mg/dL — ABNORMAL LOW (ref 0.3–1.2)
Total Protein: 6.7 g/dL (ref 6.0–8.3)

## 2012-02-21 NOTE — Progress Notes (Signed)
[facility] Group Notes:  (Counselor/Nursing/MHT/Case Management/Adjunct)  02/21/2012 3:54 PM  Type of Therapy:  Psychoeducational Skills  Participation Level:  Active  Participation Quality:  Appropriate, Attentive and Sharing  Affect:  Appropriate  Cognitive:  Alert, Appropriate and Oriented  Insight:  Good  Engagement in Group:  Good  Engagement in Therapy:  n/a  Modes of Intervention:  Activity, Education, Problem-solving, Socialization and Support  Summary of Progress/Problems: Saliou attended Psychoeducational group on labels. 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 Hunt was active and insightful while group discussed what labels are, how we use them, how they effect our way of thinking, and listed positive and negative labels they have had. Jemari was given a homework assignment to list 10 ways he has been labeled and to find the reality of the situation/label.    Wandra Scot 02/21/2012, 3:54 PM

## 2012-02-21 NOTE — Progress Notes (Signed)
Psychoeducational Group Note  Date:  02/21/2012 Time:  2100   Group Topic/Focus:  Karaoke  Participation Level:  Minimal  Participation Quality:  Appropriate  Affect:  Flat  Cognitive:  Alert  Insight:  Good  Engagement in Group:  Good  Additional Comments:    Humberto Seals Monique 02/21/2012, 10:10 PM

## 2012-02-21 NOTE — Progress Notes (Signed)
Patient ID: [name], male   DOB: 1956-04-13, 56 y.o.   MRN: [id number] D: Pt. Reports "bored."  "I done been here about 20 times."  Pt. Denies SHI. Pt reports depression at "5" of 10. A: Pt. Encouraged to attend group. Staff will monitor q15min for safety. Writer will monitor for s/s of distress.  R: Pt. Is safe on the unit. Pt. Attended group. No distress noted.
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
## 2012-02-21 NOTE — Progress Notes (Signed)
  D) Patient pleasant and cooperative upon my assessment. Patient states slept "well," and  appetite is " good." Patient rates depression as   3/10, patient rates hopeless feelings as  0/10. Patient endorses passive Si "off and on" patient verbalizes "I have been having suicidal thoughts for years."  Patient contracts verbally for safety with RN. Patient denies HI, denies A/V hallucinations.   A) Patient offered support and encouragement, patient encouraged to discuss feelings/concerns with staff. Patient verbalized understanding. Patient monitored Q15 minutes for safety. Patient met with MD to discuss today's goals and plan of care.  R) Patient active on unit, attending groups in day room and meals in dining room.  Patient taking medications as ordered. Will continue to monitor.

## 2012-02-21 NOTE — Progress Notes (Signed)
[facility] MD Progress Note  02/21/2012 12:03 PM [name]  MRN:  [id number] S: Patient seen, reports his mood is better. However having trouble with finding the right words for speech. States this is a recent occurrence after starting the Lithium. Doing well with Lithium taper.  Diagnosis:   Axis I: Depressive Disorder NOS Axis II: No diagnosis Axis III:  Past Medical History  Diagnosis Date  . Mental disorder   . Depression   . Neuromuscular disorder   . Multiple sclerosis, relapsing-remitting   . MS (multiple sclerosis)    Axis IV: other psychosocial or environmental problems Axis V: 51-60 moderate symptoms  ADL's:  Intact  Sleep: Fair  Appetite:  Fair   Mental Status Examination/Evaluation: Objective:  Appearance: Casual  Eye Contact::  Fair  Speech:  Slow  Volume:  Decreased  Mood:  Dysphoric  Affect:  Blunt  Thought Process:  Coherent  Orientation:  Full  Thought Content:  WDL  Suicidal Thoughts:  No  Homicidal Thoughts:  No  Memory:  Immediate;   Fair Recent;   Fair Remote;   Fair  Judgement:  Fair  Insight:  Fair  Psychomotor Activity:  Normal  Concentration:  Fair  Recall:  Fair  Akathisia:  No  Handed:  Right  AIMS (if indicated):     Assets:  Communication Skills Desire for Improvement  Sleep:  Number of Hours: 6.75    Vital Signs:Blood pressure 118/84, pulse 76, temperature 97.4 F (36.3 C), temperature source Oral, resp. rate 16, height 5\' 11"  (1.803 m), weight 83.462 kg (184 lb). Current Medications: Current Facility-Administered Medications  Medication Dose Route Frequency Provider Last Rate Last Dose  . acetaminophen (TYLENOL) tablet 650 mg  650 mg Oral Q6H PRN Caroleen Hamman, NP      . alum & mag hydroxide-simeth (MAALOX/MYLANTA) 200-200-20 MG/5ML suspension 30 mL  30 mL Oral Q4H PRN Caroleen Hamman, NP      . ARIPiprazole (ABILIFY) tablet 2 mg  2 mg Oral Daily Shuvon Rankin, NP   2 mg at 02/21/12 0724  . benztropine (COGENTIN) tablet 0.5 mg   0.5 mg Oral BID Shuvon Rankin, NP   0.5 mg at 02/21/12 0724  . buPROPion (WELLBUTRIN XL) 24 hr tablet 150 mg  150 mg Oral Daily Shuvon Rankin, NP   150 mg at 02/21/12 0724  . Dimethyl Fumarate CPDR 1 tablet  1 tablet Oral BID Mojeed Akintayo   1 tablet at 02/21/12 0800  . escitalopram (LEXAPRO) tablet 10 mg  10 mg Oral Daily Rochell Mabie, MD   10 mg at 02/21/12 0724  . lithium carbonate capsule 300 mg  300 mg Oral Q breakfast Norval Gable, NP   300 mg at 02/21/12 0724  . magnesium hydroxide (MILK OF MAGNESIA) suspension 30 mL  30 mL Oral Daily PRN Caroleen Hamman, NP      . DISCONTD: lithium carbonate capsule 300 mg  300 mg Oral BID WC Shuvon Rankin, NP   300 mg at 02/20/12 1657    Lab Results:  Results for orders placed during the hospital encounter of 02/17/12 (from the past 48 hour(s))  COMPREHENSIVE METABOLIC PANEL     Status: Abnormal   Collection Time   02/21/12  6:15 AM      Component Value Range Comment   Sodium 140  135 - 145 mEq/L    Potassium 4.5  3.5 - 5.1 mEq/L    Chloride 105  96 - 112 mEq/L    CO2 27  19 -  32 mEq/L    Glucose, Bld 87  70 - 99 mg/dL    BUN 17  6 - 23 mg/dL    Creatinine, Ser 4.54  0.50 - 1.35 mg/dL    Calcium 9.4  8.4 - 09.8 mg/dL    Total Protein 6.7  6.0 - 8.3 g/dL    Albumin 3.7  3.5 - 5.2 g/dL    AST 15  0 - 37 U/L    ALT 18  0 - 53 U/L    Alkaline Phosphatase 61  39 - 117 U/L    Total Bilirubin 0.2 (*) 0.3 - 1.2 mg/dL    GFR calc non Af Amer 72 (*) >90 mL/min    GFR calc Af Amer 84 (*) >90 mL/min     Physical Findings: AIMS: Facial and Oral Movements Muscles of Facial Expression: None, normal Lips and Perioral Area: None, normal Jaw: None, normal Tongue: None, normal,Extremity Movements Upper (arms, wrists, hands, fingers): None, normal Lower (legs, knees, ankles, toes): None, normal, Trunk Movements Neck, shoulders, hips: None, normal, Overall Severity Severity of abnormal movements (highest score from questions above): None,  normal Incapacitation due to abnormal movements: None, normal Patient's awareness of abnormal movements (rate only patient's report): No Awareness, Dental Status Current problems with teeth and/or dentures?: No Does patient usually wear dentures?: No  CIWA:  CIWA-Ar Total: 2  COWS:  COWS Total Score: 2   Treatment Plan Summary: Daily contact with patient to assess and evaluate symptoms and progress in treatment Medication management  Plan: Continue taper of Lithium. Will discontinue starting 02/22/2012. Renal function tests show his GFR has improved. Will continue to monitor speech and GFR as Lithium is discontinued.  Dioselina Brumbaugh 02/21/2012, 12:03 PM

## 2012-02-21 NOTE — Progress Notes (Signed)
Psychoeducational Group Note  Date:  02/21/2012 Time:  1000  Group Topic/Focus:  Wellness Toolbox:   The focus of this group is to discuss various aspects of wellness, balancing those aspects and exploring ways to increase the ability to experience wellness.  Patients will create a wellness toolbox for use upon discharge.  Participation Level:  Active  Participation Quality:  Appropriate, Attentive, Sharing and Supportive  Affect:  Appropriate  Cognitive:  Alert and Appropriate  Insight:  Good  Engagement in Group:  Good  Additional Comments:  Productive group  Earline Mayotte 02/21/2012, 6:20 PM

## 2012-02-21 NOTE — Progress Notes (Signed)
Psychoeducational Group Note  Date:  02/21/2012 Time:  1100  Group Topic/Focus:  Rediscovering Joy:   The focus of this group is to explore various ways to relieve stress in a positive manner.  Participation Level:  Active  Participation Quality:  Appropriate, Attentive and Sharing  Affect:  Appropriate  Cognitive:  Appropriate  Insight:  Good  Engagement in Group:  Good  Additional Comments:  Patient was able to explain different ways humor and laughter have positive benefits for both mind and body. Patient was able to identify one thing that is pleasurable to patient within the five senses in a category and use the information as coping skills when needed.  Karleen Hampshire Brittini 02/21/2012, 3:13 PM

## 2012-02-21 NOTE — Progress Notes (Signed)
Patient ID: [name], male   DOB: 18-Jul-1955, 56 y.o.   MRN: [id number] D: Pt. In day room watching TV, reports "off/on" harmful thoughts, no plan. Pt. Notes he'll probably be here through out the week so meds can be monitored. A: Staff will monitor q64min for safety. Staff encouraged karaoke. Writer encouraged pt. To inform staff if harmful thoughts increase to a plan. R: Pt. Remains safe on the unit. Pt. Attends karaoke. Pt. Verbally contracts for safety.

## 2012-02-22 NOTE — Progress Notes (Signed)
[facility] MD Progress Note  02/22/2012 1:39 PM [name]  MRN:  [id number]  S: Patient continues to complain of difficulty with his speech, unable to come up with words. Very frustrated, irritable. Discussed with him that his Lithium will be discontinued completely today. Mood reported as okay. He is trying to get in touch with his Neurologist and his ex-wife and son, requesting phone assistance.  Diagnosis:   Axis I: Depressive Disorder NOS Axis II: No diagnosis Axis III:  Past Medical History  Diagnosis Date  . Mental disorder   . Depression   . Neuromuscular disorder   . Multiple sclerosis, relapsing-remitting   . MS (multiple sclerosis)    Axis IV: other psychosocial or environmental problems Axis V: 51-60 moderate symptoms  ADL's:  Intact  Sleep: Fair  Appetite:  Fair  Mental Status Examination/Evaluation: Objective:  Appearance: Disheveled  Eye Contact::  Fair  Speech:  Slow  Volume:  Decreased  Mood:  Dysphoric and Irritable  Affect:  Blunt  Thought Process:  Coherent  Orientation:  Full  Thought Content:  WDL  Suicidal Thoughts:  No  Homicidal Thoughts:  No  Memory:  Immediate;   Fair Recent;   Fair Remote;   Fair  Judgement:  Fair  Insight:  Fair  Psychomotor Activity:  Decreased  Concentration:  Fair  Recall:  Fair  Akathisia:  No  Handed:  Right  AIMS (if indicated):     Assets:  Desire for Improvement  Sleep:  Number of Hours: 5.25    Vital Signs:Blood pressure 83/58, pulse 88, temperature 98.6 F (37 C), temperature source Oral, resp. rate 16, height 5\' 11"  (1.803 m), weight 83.462 kg (184 lb). Current Medications: Current Facility-Administered Medications  Medication Dose Route Frequency Provider Last Rate Last Dose  . acetaminophen (TYLENOL) tablet 650 mg  650 mg Oral Q6H PRN Caroleen Hamman, NP      . alum & mag hydroxide-simeth (MAALOX/MYLANTA) 200-200-20 MG/5ML suspension 30 mL  30 mL Oral Q4H PRN Caroleen Hamman, NP      . ARIPiprazole  (ABILIFY) tablet 2 mg  2 mg Oral Daily Shuvon Rankin, NP   2 mg at 02/22/12 0743  . benztropine (COGENTIN) tablet 0.5 mg  0.5 mg Oral BID Shuvon Rankin, NP   0.5 mg at 02/22/12 0743  . buPROPion (WELLBUTRIN XL) 24 hr tablet 150 mg  150 mg Oral Daily Shuvon Rankin, NP   150 mg at 02/22/12 0743  . Dimethyl Fumarate CPDR 1 tablet  1 tablet Oral BID Mojeed Akintayo   1 tablet at 02/22/12 0800  . escitalopram (LEXAPRO) tablet 10 mg  10 mg Oral Daily Anureet Bruington, MD   10 mg at 02/22/12 0743  . lithium carbonate capsule 300 mg  300 mg Oral Q breakfast Norval Gable, NP   300 mg at 02/22/12 0743  . magnesium hydroxide (MILK OF MAGNESIA) suspension 30 mL  30 mL Oral Daily PRN Caroleen Hamman, NP        Lab Results:  Results for orders placed during the hospital encounter of 02/17/12 (from the past 48 hour(s))  COMPREHENSIVE METABOLIC PANEL     Status: Abnormal   Collection Time   02/21/12  6:15 AM      Component Value Range Comment   Sodium 140  135 - 145 mEq/L    Potassium 4.5  3.5 - 5.1 mEq/L    Chloride 105  96 - 112 mEq/L    CO2 27  19 - 32 mEq/L  Glucose, Bld 87  70 - 99 mg/dL    BUN 17  6 - 23 mg/dL    Creatinine, Ser 4.54  0.50 - 1.35 mg/dL    Calcium 9.4  8.4 - 09.8 mg/dL    Total Protein 6.7  6.0 - 8.3 g/dL    Albumin 3.7  3.5 - 5.2 g/dL    AST 15  0 - 37 U/L    ALT 18  0 - 53 U/L    Alkaline Phosphatase 61  39 - 117 U/L    Total Bilirubin 0.2 (*) 0.3 - 1.2 mg/dL    GFR calc non Af Amer 72 (*) >90 mL/min    GFR calc Af Amer 84 (*) >90 mL/min     Physical Findings: AIMS: Facial and Oral Movements Muscles of Facial Expression: None, normal Lips and Perioral Area: None, normal Jaw: None, normal Tongue: None, normal,Extremity Movements Upper (arms, wrists, hands, fingers): None, normal Lower (legs, knees, ankles, toes): None, normal, Trunk Movements Neck, shoulders, hips: None, normal, Overall Severity Severity of abnormal movements (highest score from questions above):  None, normal Incapacitation due to abnormal movements: None, normal Patient's awareness of abnormal movements (rate only patient's report): No Awareness, Dental Status Current problems with teeth and/or dentures?: No Does patient usually wear dentures?: No  CIWA:  CIWA-Ar Total: 2  COWS:  COWS Total Score: 2   Treatment Plan Summary: Daily contact with patient to assess and evaluate symptoms and progress in treatment Medication management  Plan: Discontinue the Lithium. Monitor mood/ speech over the weekend.   [name] 02/22/2012, 1:39 PM

## 2012-02-22 NOTE — Progress Notes (Signed)
[facility] Group Notes:  (Counselor/Nursing/MHT/Case Management/Adjunct)   Relapse Prevention 1:15-2:30   Discharge Planning Group 8:30-9:30   02/22/2012 3:54 PM  Type of Therapy:  Group Therapy  Participation Level:  Active  Participation Quality:  Appropriate and Attentive  Affect:  Appropriate  Cognitive:  Alert, Appropriate and Oriented  Insight:  Good  Engagement in Group:  Good  Engagement in Therapy:  Good  Modes of Intervention:  Education, Problem-solving and Support  Summary of Progress/Problems: Patient was attentive and supportive during relapse prevention group.  He disclosed that when he relapses he begins to have suicidal thoughts.  D/C Planning Group: Patient endorses SI.  He contracts for safety.  He rates depression at three/four.   Carys Malina L 02/22/2012, 3:54 PM

## 2012-02-22 NOTE — Progress Notes (Signed)
Psychoeducational Group Note  Date:  02/22/2012 Time:  1100  Group Topic/Focus:  Early Warning Signs:   The focus of this group is to help patients identify signs or symptoms they exhibit before slipping into an unhealthy state or crisis.  Participation Level:  Active  Participation Quality:  Appropriate and Attentive  Affect:  Appropriate  Cognitive:  Alert, Appropriate and Oriented  Insight:  Good  Engagement in Group:  Good  Additional Comments:    Noah Charon 02/22/2012, 2:16 PM

## 2012-02-22 NOTE — Progress Notes (Signed)
  D) Patient quiet and cooperative upon my assessment. Patient brightens when discussing the possibility of discharging home early next week. Patient states slept "well," and  appetite is " good." Patient rates depression as   3/10. Patient endorses passive SI, contracts verbally for safety with RN. Patient denies HI, denies A/V hallucinations.   A) Patient offered support and encouragement, patient encouraged to discuss feelings/concerns with staff. Patient verbalized understanding. Patient monitored Q15 minutes for safety. Patient met with MD to discuss today's goals and plan of care.  R) Patient active on unit, attending groups in day room and meals in dining room.  Patient taking medications as ordered. Will continue to monitor.

## 2012-02-22 NOTE — Social Work (Signed)
Patient seen during d/c planning group. He continues to endorse off/on SI but able to contract for safety.  He rates depression at three four and anxiety at six.

## 2012-02-23 DIAGNOSIS — F329 Major depressive disorder, single episode, unspecified: Principal | ICD-10-CM

## 2012-02-23 NOTE — Progress Notes (Signed)
Patient ID: [name], male   DOB: Apr 21, 1956, 56 y.o.   MRN: [id number]  D: Patient denies SI/HI/AVH. Patient did attend evening group. Patient visible on the milieu. No distress noted. Pt plans on going back to group home when he leaves [facility]. Pt seems guarded when talking, but opens up after a few minutes of talking.   A: Support and encouragement offered.  Q 15 min checks continued for patient safety.   R: Patient receptive. Patient remains safe on the unit.

## 2012-02-23 NOTE — Progress Notes (Signed)
Patient ID: HALFORD SPAYD, male   DOB: [birth year], 56 y.o.   MRN: [id number] [facility] MD Progress Note  02/23/2012 11:37 AM GRAFTON LEHRKE  MRN:  [id number]  S: Better sleep last night. Patient continues to complain of difficulty with his speech, unable to come up with words at times for the last few weeks now per pt. Seems upset about it.  Mood reported as better today.   Diagnosis:   Axis I: Depressive Disorder NOS Axis II: No diagnosis Axis III:  Past Medical History  Diagnosis Date  . Mental disorder   . Depression   . Neuromuscular disorder   . Multiple sclerosis, relapsing-remitting   . MS (multiple sclerosis)    Axis IV: other psychosocial or environmental problems Axis V: 51-60 moderate symptoms  ADL's:  Intact  Sleep: Fair  Appetite:  Fair  Mental Status Examination/Evaluation: Objective:  Appearance: Disheveled  Eye Contact::  Fair  Speech:  Slow  Volume:  Decreased  Mood:  better  Affect:  Blunt  Thought Process:  Coherent  Orientation:  Full  Thought Content:  WDL  Suicidal Thoughts:  No  Homicidal Thoughts:  No  Memory:  Immediate;   Fair Recent;   Fair Remote;   Fair  Judgement:  Fair  Insight:  limited  Psychomotor Activity:  Decreased  Concentration:  Fair  Recall:  Fair  Akathisia:  No  Handed:  Right  AIMS (if indicated):     Assets:  Desire for Improvement  Sleep:  Number of Hours: 5.75    Vital Signs:Blood pressure 75/49, pulse 93, temperature 98.9 F (37.2 C), temperature source Oral, resp. rate 16, height 5\' 11"  (1.803 m), weight 83.462 kg (184 lb). Current Medications: Current Facility-Administered Medications  Medication Dose Route Frequency Provider Last Rate Last Dose  . acetaminophen (TYLENOL) tablet 650 mg  650 mg Oral Q6H PRN Caroleen Hamman, NP      . alum & mag hydroxide-simeth (MAALOX/MYLANTA) 200-200-20 MG/5ML suspension 30 mL  30 mL Oral Q4H PRN Caroleen Hamman, NP      . ARIPiprazole (ABILIFY) tablet 2 mg  2 mg Oral Daily Shuvon  Rankin, NP   2 mg at 02/23/12 0843  . benztropine (COGENTIN) tablet 0.5 mg  0.5 mg Oral BID Shuvon Rankin, NP   0.5 mg at 02/23/12 0843  . buPROPion (WELLBUTRIN XL) 24 hr tablet 150 mg  150 mg Oral Daily Shuvon Rankin, NP   150 mg at 02/23/12 0843  . Dimethyl Fumarate CPDR 1 tablet  1 tablet Oral BID Mojeed Akintayo   1 tablet at 02/23/12 0845  . escitalopram (LEXAPRO) tablet 10 mg  10 mg Oral Daily Himabindu Ravi, MD   10 mg at 02/23/12 0843  . lithium carbonate capsule 300 mg  300 mg Oral Q breakfast Norval Gable, NP   300 mg at 02/23/12 0843  . magnesium hydroxide (MILK OF MAGNESIA) suspension 30 mL  30 mL Oral Daily PRN Caroleen Hamman, NP        Lab Results:  No results found for this or any previous visit (from the past 48 hour(s)).  Physical Findings: AIMS: Facial and Oral Movements Muscles of Facial Expression: None, normal Lips and Perioral Area: None, normal Jaw: None, normal Tongue: None, normal,Extremity Movements Upper (arms, wrists, hands, fingers): None, normal Lower (legs, knees, ankles, toes): None, normal, Trunk Movements Neck, shoulders, hips: None, normal, Overall Severity Severity of abnormal movements (highest score from questions above): None, normal Incapacitation due to abnormal movements: None, normal  Patient's awareness of abnormal movements (rate only patient's report): No Awareness, Dental Status Current problems with teeth and/or dentures?: No Does patient usually wear dentures?: No  CIWA:  CIWA-Ar Total: 2  COWS:  COWS Total Score: 2   Treatment Plan Summary: Daily contact with patient to assess and evaluate symptoms and progress in treatment Medication management  Plan:  Continue current meds   Wonda Cerise 02/23/2012, 11:37 AM

## 2012-02-23 NOTE — Progress Notes (Signed)
Psychoeducational Group Note  Date:  02/23/2012 Time:  2000  Group Topic/Focus:  Wrap-Up Group:   The focus of this group is to help patients review their daily goal of treatment and discuss progress on daily workbooks.  Participation Level:  Active  Participation Quality:  Appropriate  Affect:  Appropriate  Cognitive:  Appropriate  Insight:  Good  Engagement in Group:  Good  Additional Comments:    Damiya Sandefur A 02/23/2012, 5:47 AM

## 2012-02-23 NOTE — Progress Notes (Signed)
[name] is seen out in the milieu today. He is pleasant , cooperative and attends his groups as scheduled. HE is flat, sad and depressed but he contracts for safety.  A He shares that he knows he is safe here and that he trusts nursing.       R Safety is in place and POC cont with therapeutic relationship fostered

## 2012-02-23 NOTE — Clinical Social Work Psych Note (Signed)
[facility] Group Notes:  (Counselor/Nursing/MHT/Case Management/Adjunct)  02/23/2012   Type of Therapy:  Group Therapy  Participation Level:  Active  Participation Quality:  Appropriate  Affect:  Appropriate  Cognitive:  Alert  Insight:  Good  Engagement in Group:  Good  Engagement in Therapy:  Good  Modes of Intervention:  Socialization, Support, Clarification and Education  Summary of Progress/Problems:The main focus of the process group therapy was for the patient to process self sabotaging and enabling behaviors that hinders their ability to to meet their goals and remain on a healthy track. The patient stated that his self-sabotaging behavior included feeling sad which led to him washing down an excessive amount of medication with a six pack of beer.    Roberta Kelly Claudette Laws, [place] 02/23/2012 4:02 PM

## 2012-02-23 NOTE — Progress Notes (Signed)
Psychoeducational Group Note  Date:  12/01/2011 Time: 1015  Group Topic/Focus:  Identifying Needs:   The focus of this group is to help patients identify their personal needs that have been historically problematic and identify healthy behaviors to address their needs.  Participation Level:  active Participation Quality: good Affect: flat Cognitive:    Insight:  good  Engagement in Group: engaged  Additional Comments: Pt was engaged in group, asked appropriate questions, shared personal life experience with the group and demonstrates willingness to process and understand her problems. PDuke RN BC   

## 2012-02-23 NOTE — Progress Notes (Signed)
D.  Pt with minimal interaction, denies complaints at this time.  No night time medications.  Interacting appropriately on unit.  Passive SI endorsed but contracts for safety on unit.  Denies HI/hallucinations at this time.  A.  Support and encouragement offered R.  No acute distressed noted, will continue to monitor.

## 2012-02-24 NOTE — Progress Notes (Signed)
Patient ID: [name], male   DOB: October 05, 1955, 56 y.o.   MRN: [id number] Patient ID: [name], male   DOB: [birth year], 56 y.o.   MRN: [id number] [facility] MD Progress Note  02/24/2012 11:42 AM [name]  MRN:  [id number]  S: Better sleep last night. Patient thinks speech is getting better now and seems happy about. Trying to reach his family but no luck so far.  Mood reported as better today.   Diagnosis:   Axis I: Depressive Disorder NOS Axis II: No diagnosis Axis III:  Past Medical History  Diagnosis Date  . Mental disorder   . Depression   . Neuromuscular disorder   . Multiple sclerosis, relapsing-remitting   . MS (multiple sclerosis)    Axis IV: other psychosocial or environmental problems Axis V: 51-60 moderate symptoms  ADL's:  Intact  Sleep: Fair  Appetite:  Fair  Mental Status Examination/Evaluation: Objective:  Appearance: Disheveled  Eye Contact::  Fair  Speech:  Slow  Volume:  Decreased  Mood:  better  Affect:  ristricted  Thought Process:  Coherent  Orientation:  Full  Thought Content:  WDL  Suicidal Thoughts:  No  Homicidal Thoughts:  No  Memory:  Immediate;   Fair Recent;   Fair Remote;   Fair  Judgement:  Fair  Insight:  limited  Psychomotor Activity:  Decreased  Concentration:  Fair  Recall:  Fair  Akathisia:  No  Handed:  Right  AIMS (if indicated):     Assets:  Desire for Improvement  Sleep:  Number of Hours: 6.5    Vital Signs:Blood pressure 91/57, pulse 93, temperature 97.7 F (36.5 C), temperature source Oral, resp. rate 18, height 5\' 11"  (1.803 m), weight 83.462 kg (184 lb). Current Medications: Current Facility-Administered Medications  Medication Dose Route Frequency Provider Last Rate Last Dose  . acetaminophen (TYLENOL) tablet 650 mg  650 mg Oral Q6H PRN Caroleen Hamman, NP      . alum & mag hydroxide-simeth (MAALOX/MYLANTA) 200-200-20 MG/5ML suspension 30 mL  30 mL Oral Q4H PRN Caroleen Hamman, NP      . ARIPiprazole (ABILIFY)  tablet 2 mg  2 mg Oral Daily Shuvon Rankin, NP   2 mg at 02/24/12 0819  . benztropine (COGENTIN) tablet 0.5 mg  0.5 mg Oral BID Shuvon Rankin, NP   0.5 mg at 02/24/12 0819  . buPROPion (WELLBUTRIN XL) 24 hr tablet 150 mg  150 mg Oral Daily Shuvon Rankin, NP   150 mg at 02/24/12 0819  . Dimethyl Fumarate CPDR 1 tablet  1 tablet Oral BID Mojeed Akintayo   1 tablet at 02/23/12 1640  . escitalopram (LEXAPRO) tablet 10 mg  10 mg Oral Daily Himabindu Ravi, MD   10 mg at 02/24/12 0819  . lithium carbonate capsule 300 mg  300 mg Oral Q breakfast Norval Gable, NP   300 mg at 02/24/12 0819  . magnesium hydroxide (MILK OF MAGNESIA) suspension 30 mL  30 mL Oral Daily PRN Caroleen Hamman, NP        Lab Results:  No results found for this or any previous visit (from the past 48 hour(s)).  Physical Findings: AIMS: Facial and Oral Movements Muscles of Facial Expression: None, normal Lips and Perioral Area: None, normal Jaw: None, normal Tongue: None, normal,Extremity Movements Upper (arms, wrists, hands, fingers): None, normal Lower (legs, knees, ankles, toes): None, normal, Trunk Movements Neck, shoulders, hips: None, normal, Overall Severity Severity of abnormal movements (highest score from questions above): None,  normal Incapacitation due to abnormal movements: None, normal Patient's awareness of abnormal movements (rate only patient's report): No Awareness, Dental Status Current problems with teeth and/or dentures?: No Does patient usually wear dentures?: No  CIWA:  CIWA-Ar Total: 2  COWS:  COWS Total Score: 2   Treatment Plan Summary: Daily contact with patient to assess and evaluate symptoms and progress in treatment Medication management  Plan:  Continue current meds Case manager will help to contact family   Wonda Cerise 02/24/2012, 11:42 AM

## 2012-02-24 NOTE — Progress Notes (Addendum)
D) Pt attending the program and interacting with his peers. Rates his depression at a 2 and his hopelessness at a 1. Continues to have thoughts of SI on and off. Asked for a list of his medications so that he could give it to his X wife and she could file his insurance. Talked about his x wife and his feelings for her and how he continues to be upset over the fact that she left him and never gave it an explaination.  A) Given support and active listening. Provided with support. R) Continues to have thoughts of suicide on and off.

## 2012-02-24 NOTE — Progress Notes (Signed)
Psychoeducational Group Note  Date: 02/24/2012 Time: 1015  Group Topic/Focus:  Making Healthy Choices:   The focus of this group is to help patients identify negative/unhealthy choices they were using prior to admission and identify positive/healthier coping strategies to replace them upon discharge.  Participation Level:  Active  Participation Quality:  Appropriate  Affect:  Appropriate  Cognitive:  Alert  Insight:  Good  Engagement in Group:  Good  Additional Comments: Shantoya Geurts, Joie Bimler

## 2012-02-24 NOTE — Clinical Social Work Psych Note (Signed)
[facility] Group Notes:  (Counselor/Nursing/MHT/Case Management/Adjunct)  02/24/2012   Type of Therapy:  Group Therapy  Participation Level:  Active  Participation Quality:  Appropriate  Affect:  Appropriate  Cognitive:  Alert  Insight:  Good  Engagement in Group:  Good  Engagement in Therapy:  Good  Modes of Intervention:  Socialization, Support, Clarification and Education  Summary of Progress/Problems:The main focus of the process group was for the patient to identify ways to remain safe upon discharge and ways to prevent future hospitalizations. The patient stated that he was going to attend outpatient therapy.    Isaia Hassell Claudette Laws, [place] 02/24/2012 4:13 PM

## 2012-02-25 DIAGNOSIS — F4323 Adjustment disorder with mixed anxiety and depressed mood: Secondary | ICD-10-CM

## 2012-02-25 NOTE — Progress Notes (Addendum)
D:  Patient's self inventory sheet, patient sleeps well, has good appetite, normal energy level, good attention span.  Rated depression #2.   Denied anxiety and hopelessness.   Denied withdrawals.   Denied SI.  Denied physical problems.  After discharge, will "behave myself.  Stay away from hotel on [facility]."  No questions for staff.  Will live at [facility] house.  No problems taking medications after discharge.   A:  Medications given per MD order.  Support and encouragement given throughout day.  Support and safety checks completed as ordered. R:  Following treatment plan.  Denied SI and HI.  Denied A/V hallucinations.  Contracts for safety.  Patient remains safe and receptive on unit. Patient attended morning group.

## 2012-02-25 NOTE — Progress Notes (Signed)
[facility] Group Notes:  (Counselor/Nursing/MHT/Case Management/Adjunct)  02/25/2012 4:07 PM  Type of Therapy:  Psychoeducational Skills  Participation Level:  None   Summary of Progress/Problems:Patient declined to comment in the group discussion.   Ardelle Park O 02/25/2012, 4:07 PM

## 2012-02-25 NOTE — Progress Notes (Signed)
D: Pt remains quiet and guarded.  Mood is depressed, affect somewhat blunted.  Initiates little contact with staff or peers.  Endorses passive SI with contract for safety.  Denies HI, AVH, and acute pain.  In no apparent distress while awake.  Pt takes no medication at night.  A: Provided support and encouragement when in open areas.  Q15 minute safety checks maintained as per unit policy.  R: Pt stable but isolative. No PRN medications requested.  Safety maintained. Dion Saucier RN

## 2012-02-25 NOTE — Progress Notes (Signed)
Patient ID: COLA HIGHFILL, male   DOB: 05-Oct-1955, 56 y.o.   MRN: [id number] D: Pt. In dayroom watching TV. Reports writer "they wouldn't let me go home because I told them I was hearing hissing sounds in the ceiling." "like I'm hearing now." What writer hear was the sound of air conditioner. A: Pt. Encouraged to go to group. Staff will monitor q50min for safety. R: Pt. Attended group. Pt. Is safe on the unit.

## 2012-02-25 NOTE — Tx Team (Signed)
Interdisciplinary Treatment Plan Update (Adult)  Date:  02/25/2012  Time Reviewed:  10:47 AM   Progress in Treatment: Attending groups:   Yes   Participating in groups:  Yes Taking medication as prescribed:  Yes Tolerating medication:  Yes Family/Significant othe contact made: Contact to be made with Ms. Davis at [facility] Patient understands diagnosis:  Yes Discussing patient identified problems/goals with staff: Yes Medical problems stabilized or resolved: Yes Denies suicidal/homicidal ideation:no but  Issues/concerns per patient self-inventory:  Other:  New problem(s) identified:  Reason for Continuation of Hospitalization:  Depression Medication stabilization Suicidal ideation  Interventions implemented related to continuation of hospitalization:  Medication Management; safety checks q 15 mins  Additional comments:  Estimated length of stay: 1-2 days  Discharge Plan:  Home with outpatient follow up [facility]  New goal(s):  Review of initial/current patient goals per problem list:    1.  Goal(s): Eliminate SI/other thoughts of self harm   Met:  No  Target date: d/c  As evidenced by: Patient will no  longer endorsieSI/HI or other thoughts of self harm.    2.  Goal (s):Reduce depression (rated at two today)  Met: Yes  Target date: d/c  As evidenced by: Patient currently rating symptoms at four or below    3.  Goal(s):.stabilize on meds   Met: No  Target date: d/c  As evidenced by: Patient will report being stabilized on medications - less symptomatic    4.  Goal(s): Refer for outpatient follow up   Met:  Yes  Target date: d/c  As evidenced by: Follow up appointment scheduled    Attendees: Patient:   02/25/2012 10:47 AM  Physican:  Patrick North, MD 02/25/2012 10:47 AM  Nursing:  Neill Loft, RN 02/25/2012 10:47 AM   Nursing:   Quintella Reichert, RN 02/25/2012 10:47 AM   Clinical Social Worker:  Juline Patch, LCSW 02/25/2012 10:47 AM     Other: Norval Gable, FNP 02/25/2012 10:47 AM   Other: Teddy Spike, MSW Intern     02/25/2012 02/25/2012 Other:  Dennison Nancy, RN     02/25/2012 10:47 AM

## 2012-02-25 NOTE — Progress Notes (Signed)
[facility] Group Notes:  (Counselor/Nursing/MHT/Case Management/Adjunct)     Overcoming Obstacles 1:15 -2:30 PM       Discharge Planning Group  8:30 - 9:30   02/25/2012 3:08 PM  Type of Therapy:  Group Therapy  Participation Level:  Active  Participation Quality:  Appropriate and Attentive  Affect:  Depressed  Cognitive:  Appropriate  Insight:  Good  Engagement in Group:  Good  Engagement in Therapy:  Good  Modes of Intervention:  Education, Dentist, Support and Exploration   Summary of Progress/Problems: [name] participated in group.  He shared that clear communication is a barrier that he needs to overcome.  He also stated he is often a barrier to himself by holding himself back.   He continued to talk about hearing "hissing sound."   [name] Patient  Was Patient seen during d/c planning group.  He endorsed contracted and stated he had not intent of acting on thoughts.  He stated that he is hearing a "hissing sound."  MD advised.    [name] 02/25/2012, 3:08 PM

## 2012-02-25 NOTE — Progress Notes (Signed)
Patient ID: [name], male   DOB: [birth year], 56 y.o.   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 Mental disorder   . Depression   . Neuromuscular disorder   . Multiple sclerosis, relapsing-remitting   . MS (multiple sclerosis)    Axis IV: occupational problems and other psychosocial or environmental problems Axis V: 41-50 serious symptoms   Subjective: Pt states mood, "even, average. Not sad nor happy." Anxiety: 0/10 Depression: "4/10"  ADL's:  Impaired. Less than fair hygiene  Sleep: Fair" sleeping well". No concerns per pt.   Appetite:  Fair "appetite is good".  Suicidal Ideation:  Patient states that he is currently not suicidal. However, has occasional fleeting thoughts without plans. Denies plans.  Denies Homicidal ideation: ROS: Neuro: + tremor, - pain.; + weakness generalized GI: - diarrhea, + constipation, - N/V Psych: + depression Resp: - Sob Cardiac:- Chest pain All other systems negative  Mental Status Examination/Evaluation: Objective:  Appearance: Disheveled  Eye Contact::  Fair  Speech:  Limited; Garbled  Volume:  Normal  Mood:  Depressed  Affect:  Constricted. Blunted  Thought Process:  Coherent  Orientation:  Full  Thought Content:  Poverty;  Suicidal Thoughts:  Yes.  without intent/plan  Homicidal Thoughts:  No  Memory:  Immediate;   Poor Recent;   Poor Remote;   Poor  Judgement:  Impaired  Insight:  Fair  Psychomotor Activity:  Decreased  Concentration:  Fair  Recall:  Poor  Akathisia:  Yes patient with slight tremulousness. Pt has hx of Multiple Sclerosis  Handed:  Right  AIMS (if indicated):     Assets:  Desire for Improvement  Sleep:  Number of Hours: 6.25    Vital Signs:Blood pressure 120/72, pulse 70, temperature 97.9 F  (36.6 C), temperature source Oral, resp. rate 16, height 5\' 11"  (1.803 m), weight 83.462 kg (184 lb).   Current Medications: Current Facility-Administered Medications  Medication Dose Route Frequency Provider Last Rate Last Dose  . acetaminophen (TYLENOL) tablet 650 mg  650 mg Oral Q6H PRN Caroleen Hamman, NP      . alum & mag hydroxide-simeth (MAALOX/MYLANTA) 200-200-20 MG/5ML suspension 30 mL  30 mL Oral Q4H PRN Caroleen Hamman, NP      . ARIPiprazole (ABILIFY) tablet 2 mg  2 mg Oral Daily Shuvon Rankin, NP   2 mg at 02/25/12 0829  . benztropine (COGENTIN) tablet 0.5 mg  0.5 mg Oral BID Shuvon Rankin, NP   0.5 mg at 02/25/12 0830  . buPROPion (WELLBUTRIN XL) 24 hr tablet 150 mg  150 mg Oral Daily Shuvon Rankin, NP   150 mg at 02/25/12 0830  . Dimethyl Fumarate CPDR 1 tablet  1 tablet Oral BID Mojeed Akintayo   1 tablet at 02/25/12 0832  . escitalopram (LEXAPRO) tablet 10 mg  10 mg Oral Daily Himabindu Ravi, MD   10 mg at 02/25/12 0832  . lithium carbonate capsule 300 mg  300 mg Oral Q breakfast Norval Gable, NP   300 mg at 02/25/12 7829  . magnesium hydroxide (MILK OF MAGNESIA) suspension 30 mL  30 mL Oral Daily PRN Caroleen Hamman, NP        Lab Results:  No results found for this or any previous visit (from the past 48 hour(s)).  Physical Findings: AIMS: Facial and Oral Movements Muscles of Facial Expression: None, normal Lips and Perioral Area: None, normal Jaw: None, normal Tongue: None, normal, Extremity Movements Upper (arms, wrists, hands, fingers): None, normal Lower (legs, knees, ankles, toes): None, normal,  Trunk Movements Neck, shoulders, hips: None, normal,  Overall Severity Severity of abnormal movements (highest score from questions above): None, normal Incapacitation due to abnormal movements: None, normal Patient's awareness of abnormal movements (rate only patient's report): No Awareness,  Dental Status Current problems with teeth and/or dentures?: No Does  patient usually wear dentures?: No  CIWA:  CIWA-Ar Total: 0  COWS:  COWS Total Score: 0   Treatment Plan Summary: Daily contact with patient to assess and evaluate symptoms and progress in treatment Medication management- will continue meds as ordered. Will consider increase Wellbutrin for 02/26/12.  Plan: BMP in AM No Change to current regiment Discharge planning for this week  Norval Gable FNP-BC 02/25/2012, 4:23 PM

## 2012-02-26 LAB — BASIC METABOLIC PANEL
BUN: 15 mg/dL (ref 6–23)
CO2: 23 mEq/L (ref 19–32)
Calcium: 9.1 mg/dL (ref 8.4–10.5)
Chloride: 103 mEq/L (ref 96–112)
Creatinine, Ser: 1 mg/dL (ref 0.50–1.35)
GFR calc Af Amer: >90 mL/min (ref >90)
GFR calc non Af Amer: 82 mL/min — ABNORMAL LOW (ref >90)
Glucose, Bld: 88 mg/dL (ref 70–99)
Potassium: 4.5 mEq/L (ref 3.5–5.1)
Sodium: 136 mEq/L (ref 135–145)

## 2012-02-26 MED ORDER — BUPROPION HCL ER (XL) 150 MG PO TB24: 150.0000 mg | ORAL_TABLET | Freq: Every day | ORAL | Status: DC

## 2012-02-26 MED ORDER — ESCITALOPRAM OXALATE 10 MG PO TABS: 10.0000 mg | ORAL_TABLET | Freq: Every day | ORAL | Status: DC

## 2012-02-26 MED ORDER — BENZTROPINE MESYLATE 0.5 MG PO TABS: 0.5000 mg | ORAL_TABLET | Freq: Two times a day (BID) | ORAL | Status: DC

## 2012-02-26 MED ORDER — DIMETHYL FUMARATE 120 MG PO CPDR: 1.0000 | DELAYED_RELEASE_CAPSULE | Freq: Two times a day (BID) | ORAL | Status: DC

## 2012-02-26 MED ORDER — ARIPIPRAZOLE 2 MG PO TABS: 2.0000 mg | ORAL_TABLET | Freq: Every day | ORAL | Status: DC

## 2012-02-26 NOTE — Discharge Summary (Signed)
Physician Discharge Summary Note  Patient:  [name] is an 56 y.o., male MRN:  [id number] DOB:  03-Sep-1955 Patient phone:  [phone] (home)  Patient address:   [address] [place] [address],   Date of Admission:  02/17/2012 Date of Discharge: 02/26/2012   Reason for Admission: Pt admitted due to S/I and depression.   Discharge Diagnoses:  AXIS I: Depressive Disorder NOS  AXIS II: No diagnosis  AXIS III:  Past Medical History   Diagnosis  Date   .  Mental disorder    .  Depression    .  Neuromuscular disorder    .  Multiple sclerosis, relapsing-remitting    .  MS (multiple sclerosis)     AXIS IV: occupational problems and other psychosocial or environmental problems  AXIS V: 61-70 mild symptoms   Level of Care:  OP  Hospital Course:  Pt admitted due to S/I and depression. Due to  declining renal function (GFR of 72), mood instability, and extrapyramidal effects- tremors, and unclear mentation and ability to communicate thoughts, lithium was discontinued. Pt was given trial of Celexa, and pts mood evaluated at higher dose of 20 mg versus lower dose of 10 mg.   Consults:  psychiatry  Significant Diagnostic Studies:  labs: GFR up to 82 from 72. Results for orders placed during the hospital encounter of 02/17/12 (from the past 48 hour(s))  BASIC METABOLIC PANEL     Status: Abnormal   Collection Time   02/26/12  6:21 AM      Component Value Range Comment   Sodium 136  135 - 145 mEq/L    Potassium 4.5  3.5 - 5.1 mEq/L    Chloride 103  96 - 112 mEq/L    CO2 23  19 - 32 mEq/L    Glucose, Bld 88  70 - 99 mg/dL    BUN 15  6 - 23 mg/dL    Creatinine, Ser 2.13  0.50 - 1.35 mg/dL    Calcium 9.1  8.4 - 08.6 mg/dL    GFR calc non Af Amer 82 (*) >90 mL/min    GFR calc Af Amer >90  >90 mL/min      Discharge Vitals:   Blood pressure 107/71, pulse 98, temperature 96.8 F (36 C), temperature source Oral, resp. rate 20, height 5\' 11"  (1.803 m), weight 83.462 kg (184  lb). Lab Results:   Results for orders placed during the hospital encounter of 02/17/12 (from the past 72 hour(s))  BASIC METABOLIC PANEL     Status: Abnormal   Collection Time   02/26/12  6:21 AM      Component Value Range Comment   Sodium 136  135 - 145 mEq/L    Potassium 4.5  3.5 - 5.1 mEq/L    Chloride 103  96 - 112 mEq/L    CO2 23  19 - 32 mEq/L    Glucose, Bld 88  70 - 99 mg/dL    BUN 15  6 - 23 mg/dL    Creatinine, Ser 5.78  0.50 - 1.35 mg/dL    Calcium 9.1  8.4 - 46.9 mg/dL    GFR calc non Af Amer 82 (*) >90 mL/min    GFR calc Af Amer >90  >90 mL/min     Physical Findings: AIMS: Facial and Oral Movements Muscles of Facial Expression: None, normal Lips and Perioral Area: None, normal Jaw: None, normal Tongue: None, normal, Extremity Movements Upper (arms, wrists, hands, fingers): None, normal Lower (  legs, knees, ankles, toes): None, normal,  Trunk Movements Neck, shoulders, hips: None, normal,  Overall Severity Severity of abnormal movements (highest score from questions above): None, normal Incapacitation due to abnormal movements: None, normal Patient's awareness of abnormal movements (rate only patient's report): No Awareness,  Dental Status Current problems with teeth and/or dentures?: Yes (cavities) Does patient usually wear dentures?: No  CIWA:  CIWA-Ar Total: 0  COWS:  COWS Total Score: 0   Mental Status Exam: See Mental Status Examination and Suicide Risk Assessment completed by Attending Physician prior to discharge.  Discharge destination:  Other:  [facility]  Is patient on multiple antipsychotic therapies at discharge:  No   Has Patient had three or more failed trials of antipsychotic monotherapy by history:  No  Recommended Plan for Multiple Antipsychotic Therapies: NA  Discharge Orders    Future Orders Please Complete By Expires   Diet - low sodium heart healthy      Increase activity slowly      Discharge instructions      Comments:    Take all of your medications as prescribed.  Be sure to keep ALL follow up appointments as scheduled. This is to ensure getting your refills on time to avoid any interruption in your medication.  If you find that you can not keep your appointment, call the clinic and reschedule. Be sure to tell the nurse if you will need a refill before your appointment.   Discharge instructions      Comments:   Take all of your medications as prescribed.  Be sure to keep ALL follow up appointments as scheduled. This is to ensure getting your refills on time to avoid any interruption in your medication.  If you find that you can not keep your appointment, call the clinic and reschedule. Be sure to tell the nurse if you will need a refill before your appointment.       Medication List     As of 02/26/2012 10:34 AM    STOP taking these medications         lithium carbonate 300 MG capsule      zolpidem 10 MG tablet   Commonly known as: AMBIEN      TAKE these medications      Indication    ARIPiprazole 2 MG tablet   Commonly known as: ABILIFY   Take 1 tablet (2 mg total) by mouth daily.    Indication: Irritability associated with Autistic Disorder      benztropine 0.5 MG tablet   Commonly known as: COGENTIN   Take 1 tablet (0.5 mg total) by mouth 2 (two) times daily.    Indication: Extrapyramidal Reaction caused by Medications      buPROPion 150 MG 24 hr tablet   Commonly known as: WELLBUTRIN XL   Take 1 tablet (150 mg total) by mouth daily.    Indication: dysthymia      Dimethyl Fumarate 120 MG Cpdr   Take 1 tablet by mouth 2 (two) times daily.    Indication: Multiple Sclerosis      escitalopram 10 MG tablet   Commonly known as: LEXAPRO   Take 1 tablet (10 mg total) by mouth daily.    Indication: Depression           Follow-up Information    Follow up with [facility]. On 03/04/2012. (You are scheduled with Benita Gutter at 2:00 PM on Tuesday, March 04, 2012)    Contact  information:  [address] [place], [place]  [address]  [phone]         Follow-up recommendations:  Take all of your medications as prescribed.  Be sure to keep ALL follow up appointments as scheduled. This is to ensure getting your refills on time to avoid any interruption in your medication.  If you find that you can not keep your appointment, call the clinic and reschedule. Be sure to tell the nurse if you will need a refill before your appointment.    Signed: Norval Gable FNP-BC 02/26/2012, 10:34 AM

## 2012-02-26 NOTE — Progress Notes (Signed)
D:  Patient's self inventory sheet, patient has fair sleep, good appetite, low energy level, good attention span.  Rated depression #2, denied hopelessness and anxiety.  Denied withdrawals.  Denied SI.  Denied pain.  After discharge, plans to "stay away from that motel".  No questions for staff.  After discharge, plans to go to Miss Earlene Plater' home.  No problems taking meds after discharge. A:  Medications given per MD order.  Support and encouragement given throughout day.  Support and safety checks completed as ordered. R:  Following treatment plan.   Denied SI and HI.  Denied A/V hallucinations.  Contracts for safety.  Patient remains safe and receptive on unit.    Patient has attended groups today.  Patient has been cooperative and pleasant.  Is looking forward to discharge today.

## 2012-02-26 NOTE — Progress Notes (Signed)
[facility] Group Notes:  (Counselor/Nursing/MHT/Case Management/Adjunct)     Feelings About Diagnosis       1:15 -2 :30 PM     Discharge Planning Group  8:30 - 9:30 AM     02/26/2012 3:06 PM  Type of Therapy:  Group Therapy  Participation Level:  Minimal  Participation Quality:  Appropriate  Affect:  Appropriate  Cognitive:  Appropriate  Insight:  Limited  Engagement in Group:  Limited  Engagement in Therapy:  Limited  Modes of Intervention:  Education, Problem-solving, Support and Exploration  Summary of Progress/Problems: Lynden attended group but provided minimal participation.  Lexx attended discharge planning group and advised of being better today and ready to discharge.  He denied SI/HI.  He provided phone number to contact Ms. Earlene Plater to transport him back to the group home.   [name] 02/26/2012, 3:06 PM

## 2012-02-26 NOTE — Progress Notes (Signed)
Psychoeducational Group Note  Date:  02/26/2012 Time:  1100  Group Topic/Focus:  Recovery Goals:   The focus of this group is to identify appropriate goals for recovery and establish a plan to achieve them.  Participation Level:  Minimal  Participation Quality:  Resistant  Affect:  Depressed and Flat  Cognitive:  Oriented  Insight:  Limited  Engagement in Group:  Limited  Additional Comments: Patient said he didn't know what recovery was and  never experienced it.  Ralph Leyden 02/26/2012, 1:42 PM

## 2012-02-26 NOTE — [facility] Suicide Risk Assessment (Signed)
Suicide Risk Assessment  Discharge Assessment     Demographic Factors:  Male, Caucasian and Low socioeconomic status  Mental Status Per Nursing Assessment::   On Admission:     Current Mental Status by Physician: Patient alert and oriented to 4. Mood improved. Denies AH/VH/HI/SI. Fair insight and judgement.  Loss Factors: Decrease in vocational status and Decline in physical health  Historical Factors: NA, Multiple sclerosis  Risk Reduction Factors:   Positive coping skills or problem solving skills  Continued Clinical Symptoms:  Depression:   Recent sense of peace/wellbeing  Cognitive Features That Contribute To Risk:  Cognitively intact   Suicide Risk:  Minimal: No identifiable suicidal ideation.  Patients presenting with no risk factors but with morbid ruminations; may be classified as minimal risk based on the severity of the depressive symptoms  Discharge Diagnoses:   AXIS I:  Depressive Disorder NOS AXIS II:  No diagnosis AXIS III:   Past Medical History  Diagnosis Date  . Mental disorder   . Depression   . Neuromuscular disorder   . Multiple sclerosis, relapsing-remitting   . MS (multiple sclerosis)    AXIS IV:  occupational problems and other psychosocial or environmental problems AXIS V:  61-70 mild symptoms  Plan Of Care/Follow-up recommendations:  Activity:  As tolerated Diet:  normal  Is patient on multiple antipsychotic therapies at discharge:  No   Has Patient had three or more failed trials of antipsychotic monotherapy by history:  No  Recommended Plan for Multiple Antipsychotic Therapies: NA  Darick Fetters 02/26/2012, 9:27 AM

## 2012-02-26 NOTE — Progress Notes (Addendum)
Discharge Note:  Patient picked up by [facility] home employee and will go to Byron' home.  Denied SI and HI.   Denied A/V hallucinations.   Denied pain.   Stated he appreciated assistance received from [facility] staff.  Patient stated he understands all discharge instructions, suicide prevention information and has no questions.  Patient received all his belongings, belt, hat, phone, lighter, wallet $20, cigarettes and miscellaneous items.  Patient has been cooperative and pleasant.   Tecfidera 240 mg medication returned to patient which was brought to [facility].  Patient stated he does not have a job, no children and denied access to firearms.

## 2012-02-26 NOTE — Progress Notes (Signed)
[facility] Case Management Discharge Plan:  Will you be returning to the same living situation after discharge: Yes,  Patient returning to [facility] At discharge, do you have transportation home?:Yes,  Writer spoke with group home owner's son, Jamar, and he is to arrange patient's transportation back to the facility. Do you have the ability to pay for your medications:Yes,  Patient has Medicaid  Interagency Information:     Release of information consent forms completed and in the chart;  Patient's signature needed at discharge.  Patient to Follow up at:  Follow-up Information    Follow up with [facility]. On 03/04/2012. (You are scheduled with Benita Gutter at 2:00 PM on Tuesday, March 04, 2012)    Contact information:   [address] [place], [place]  [address]  [phone]         Patient denies SI/HI:   Yes.  Patient no longer endorsing SI/HI or other thoughts of self harm.   Safety Planning and Suicide Prevention discussed:  Yes,  Reviewed during aftercare groups  Barrier to discharge identified:No.  Summary and Recommendations: Patient encouraged to be compliant with medications and follow up with outpatient recommendations    [name] 02/26/2012, 11:44 AM

## 2012-02-29 NOTE — Progress Notes (Signed)
Patient Discharge Instructions:  After Visit Summary (AVS):   Faxed to:  02/29/12 Psychiatric Admission Assessment Note:   Faxed to:  02/29/12 Suicide Risk Assessment - Discharge Assessment:   Faxed to:  02/29/12 Faxed/Sent to the Next Level Care provider:  02/29/12 Faxed to [facility] @ [phone]  Jerelene Redden, 02/29/2012, 3:55 PM

## 2012-07-27 ENCOUNTER — Emergency Department (HOSPITAL_COMMUNITY)
Admission: EM | Admit: 2012-07-27 | Discharge: 2012-07-28 | Disposition: A | Discharge status: Nursing Home (Includes ICF) | Payer: Medicare Other | Attending: Emergency Medicine | Admitting: Emergency Medicine

## 2012-07-27 ENCOUNTER — Encounter (HOSPITAL_COMMUNITY): Payer: Self-pay

## 2012-07-27 DIAGNOSIS — F17200 Nicotine dependence, unspecified, uncomplicated: Secondary | ICD-10-CM | POA: Insufficient documentation

## 2012-07-27 DIAGNOSIS — R45851 Suicidal ideations: Secondary | ICD-10-CM

## 2012-07-27 DIAGNOSIS — Z79899 Other long term (current) drug therapy: Secondary | ICD-10-CM | POA: Insufficient documentation

## 2012-07-27 DIAGNOSIS — G35 Multiple sclerosis: Secondary | ICD-10-CM | POA: Insufficient documentation

## 2012-07-27 DIAGNOSIS — Z8659 Personal history of other mental and behavioral disorders: Secondary | ICD-10-CM | POA: Insufficient documentation

## 2012-07-27 DIAGNOSIS — Z8669 Personal history of other diseases of the nervous system and sense organs: Secondary | ICD-10-CM | POA: Insufficient documentation

## 2012-07-27 DIAGNOSIS — F3289 Other specified depressive episodes: Secondary | ICD-10-CM | POA: Insufficient documentation

## 2012-07-27 DIAGNOSIS — F329 Major depressive disorder, single episode, unspecified: Secondary | ICD-10-CM | POA: Insufficient documentation

## 2012-07-27 DIAGNOSIS — IMO0002 Reserved for concepts with insufficient information to code with codable children: Secondary | ICD-10-CM | POA: Insufficient documentation

## 2012-07-27 LAB — CBC
HCT: 45 % (ref 39.0–52.0)
Hemoglobin: 15 g/dL (ref 13.0–17.0)
MCH: 29.9 pg (ref 26.0–34.0)
MCHC: 33.3 g/dL (ref 30.0–36.0)
MCV: 89.8 fL (ref 78.0–100.0)
Platelets: 196 10*3/uL (ref 150–400)
RBC: 5.01 MIL/uL (ref 4.22–5.81)
RDW: 14.9 % (ref 11.5–15.5)
WBC: 6.4 10*3/uL (ref 4.0–10.5)

## 2012-07-27 LAB — COMPREHENSIVE METABOLIC PANEL
ALT: 21 U/L (ref 0–53)
AST: 19 U/L (ref 0–37)
Albumin: 4.1 g/dL (ref 3.5–5.2)
Alkaline Phosphatase: 65 U/L (ref 39–117)
BUN: 13 mg/dL (ref 6–23)
CO2: 29 mEq/L (ref 19–32)
Calcium: 9.3 mg/dL (ref 8.4–10.5)
Chloride: 102 mEq/L (ref 96–112)
Creatinine, Ser: 1.02 mg/dL (ref 0.50–1.35)
GFR calc Af Amer: >90 mL/min (ref >90)
GFR calc non Af Amer: 80 mL/min — ABNORMAL LOW (ref >90)
Glucose, Bld: 90 mg/dL (ref 70–99)
Potassium: 4.1 mEq/L (ref 3.5–5.1)
Sodium: 138 mEq/L (ref 135–145)
Total Bilirubin: 0.3 mg/dL (ref 0.3–1.2)
Total Protein: 7.3 g/dL (ref 6.0–8.3)

## 2012-07-27 LAB — RAPID URINE DRUG SCREEN, HOSP PERFORMED
Amphetamines: NOT DETECTED
Barbiturates: NOT DETECTED
Benzodiazepines: NOT DETECTED
Cocaine: NOT DETECTED
Opiates: NOT DETECTED
Tetrahydrocannabinol: NOT DETECTED

## 2012-07-27 LAB — SALICYLATE LEVEL: Salicylate Lvl: <2 mg/dL — ABNORMAL LOW (ref 2.8–20.0)

## 2012-07-27 LAB — ETHANOL: Alcohol, Ethyl (B): <11 mg/dL (ref 0–11)

## 2012-07-27 LAB — ACETAMINOPHEN LEVEL: Acetaminophen (Tylenol), Serum: <15 ug/mL (ref 10–30)

## 2012-07-27 MED ORDER — ONDANSETRON HCL 4 MG PO TABS: 4.0000 mg | ORAL_TABLET | Freq: Three times a day (TID) | ORAL | Status: DC | PRN

## 2012-07-27 MED ORDER — DIMETHYL FUMARATE 240 MG PO CPDR: 1.0000 | DELAYED_RELEASE_CAPSULE | Freq: Two times a day (BID) | ORAL | Status: DC

## 2012-07-27 MED ORDER — ARIPIPRAZOLE 5 MG PO TABS
5.0000 mg | ORAL_TABLET | Freq: Every day | ORAL | Status: DC
  Administered 2012-07-27: 5 mg via ORAL
  Filled 2012-07-27 (×2): qty 1

## 2012-07-27 MED ORDER — BENZTROPINE MESYLATE 1 MG PO TABS
0.5000 mg | ORAL_TABLET | Freq: Two times a day (BID) | ORAL | Status: DC
  Administered 2012-07-27 (×2): 0.5 mg via ORAL
  Filled 2012-07-27: qty 1
  Filled 2012-07-27: qty 2
  Filled 2012-07-27: qty 1

## 2012-07-27 MED ORDER — ESCITALOPRAM OXALATE 10 MG PO TABS
20.0000 mg | ORAL_TABLET | Freq: Every morning | ORAL | Status: DC
  Administered 2012-07-27 – 2012-07-28 (×2): 20 mg via ORAL
  Filled 2012-07-27 (×2): qty 2

## 2012-07-27 MED ORDER — BUPROPION HCL ER (XL) 300 MG PO TB24
300.0000 mg | ORAL_TABLET | Freq: Every morning | ORAL | Status: DC
  Administered 2012-07-27 – 2012-07-28 (×2): 300 mg via ORAL
  Filled 2012-07-27 (×2): qty 1

## 2012-07-27 MED ORDER — IBUPROFEN 600 MG PO TABS: 600.0000 mg | ORAL_TABLET | Freq: Three times a day (TID) | ORAL | Status: DC | PRN

## 2012-07-27 MED ORDER — NICOTINE 21 MG/24HR TD PT24
21.0000 mg | MEDICATED_PATCH | Freq: Every day | TRANSDERMAL | Status: DC
  Filled 2012-07-27: qty 1

## 2012-07-27 NOTE — ED Provider Notes (Signed)
History     CSN: [id number]  Arrival date & time 07/27/12  1054   First MD Initiated Contact with Patient 07/27/12 1132      Chief Complaint  Patient presents with  . Medical Clearance    (Consider location/radiation/quality/duration/timing/severity/associated sxs/prior treatment) HPI Comments: Patient from assisted living with with suicidal intent and plan to overdose on pills or jump off a building. History of depression that he states has been doing well the past several days. He has been off of his Abilify secondary to cost. Has plan to take sleeping pills or jump off a building. Denies any homicidal ideation denies any hallucinations. Denies any recent recent alcohol or drug abuse.  The history is provided by the patient and a caregiver.    Past Medical History  Diagnosis Date  . Mental disorder   . Depression   . Neuromuscular disorder   . Multiple sclerosis, relapsing-remitting   . MS (multiple sclerosis)     Past Surgical History  Procedure Laterality Date  . Tonsillectomy      No family history on file.  History  Substance Use Topics  . Smoking status: Current Every Day Smoker -- 0.50 packs/day for 36 years    Types: Cigarettes  . Smokeless tobacco: Not on file  . Alcohol Use: Yes     Comment: "hardly drink except when I get into a hotel room, probably every 6 months or so"      Review of Systems  Constitutional: Negative for fever, activity change and appetite change.  HENT: Negative for congestion and rhinorrhea.   Respiratory: Negative for cough, chest tightness and shortness of breath.   Cardiovascular: Negative for chest pain.  Gastrointestinal: Negative for nausea, vomiting and abdominal pain.  Genitourinary: Negative for dysuria and hematuria.  Musculoskeletal: Negative for back pain.  Skin: Negative for rash.  Psychiatric/Behavioral: Positive for suicidal ideas, behavioral problems and self-injury.  A complete 10 system review of systems was  obtained and all systems are negative except as noted in the HPI and PMH.    Allergies  Review of patient's allergies indicates no known allergies.  Home Medications   Current Outpatient Rx  Name  Route  Sig  Dispense  Refill  . ARIPiprazole (ABILIFY) 5 MG tablet   Oral   Take 5 mg by mouth daily.         . benztropine (COGENTIN) 0.5 MG tablet   Oral   Take 1 tablet (0.5 mg total) by mouth 2 (two) times daily.   60 tablet   0   . buPROPion (WELLBUTRIN XL) 300 MG 24 hr tablet   Oral   Take 300 mg by mouth every morning.         . Dimethyl Fumarate (TECFIDERA) 240 MG CPDR   Oral   Take 1 tablet by mouth 2 (two) times daily.         [name] escitalopram (LEXAPRO) 20 MG tablet   Oral   Take 20 mg by mouth every morning.           BP 119/79  Pulse 64  Temp(Src) 98.4 F (36.9 C) (Oral)  Resp 16  SpO2 97%  Physical Exam  Constitutional: He is oriented to person, place, and time. He appears well-developed and well-nourished. No distress.  HENT:  Head: Normocephalic and atraumatic.  Mouth/Throat: Oropharynx is clear and moist. No oropharyngeal exudate.  Eyes: Conjunctivae and EOM are normal. Pupils are equal, round, and reactive to light.  Neck: Normal range of  motion. Neck supple.  Cardiovascular: Normal rate, regular rhythm and normal heart sounds.   No murmur heard. Pulmonary/Chest: Effort normal and breath sounds normal. No respiratory distress.  Abdominal: Soft. There is no tenderness. There is no rebound and no guarding.  Musculoskeletal: Normal range of motion. He exhibits no edema and no tenderness.  Neurological: He is alert and oriented to person, place, and time. No cranial nerve deficit. He exhibits normal muscle tone. Coordination normal.  5 out of 5 strength throughout, no clonus  Skin: Skin is warm.  Psychiatric:  Flat affect, withdrawn    ED Course  Procedures (including critical care time)  Labs Reviewed  COMPREHENSIVE METABOLIC PANEL -  Abnormal; Notable for the following:    GFR calc non Af Amer 80 (*)    All other components within normal limits  SALICYLATE LEVEL - Abnormal; Notable for the following:    Salicylate Lvl <2.0 (*)    All other components within normal limits  ACETAMINOPHEN LEVEL  CBC  ETHANOL  URINE RAPID DRUG SCREEN (HOSP PERFORMED)   No results found.   1. Suicidal ideation       MDM  Suicidal ideation with plan to jump a building or take pills. Vitals are stable, no distress.  Screening labs, IVC, discuss with act team  Labs unremarkable. Patient is medically clear for psychiatric evaluation.     Glynn Octave, MD 07/27/12 [phone]

## 2012-07-27 NOTE — ED Notes (Signed)
[employer] present. NOT CURRENTLY UNDER IVC.  Pt admits to SI denies HI- Pt on abilify for several years now cannot afford.  Pt  States wanted to walk to [facility] with the intent to get sleeping pills. Did not get pills.  Pt States at this time if able to walk and with means he would end his life today.  Pt called 911 and hung up.  911 returned call.   Pt resides at [facility] and is currently on Suicide Watch at present per staff. [employer] Officer Buford Dresser this information is accurate. Pt present with NAD pleasant and cooperative with care.

## 2012-07-27 NOTE — ED Notes (Signed)
MD at bedside. 

## 2012-07-27 NOTE — Progress Notes (Signed)
Spoke to [place] the nurse and pt is asleep.  Will assess once pt is awake and more rested so he can participate more fully in assessment process.

## 2012-07-27 NOTE — ED Notes (Signed)
Pt is a fall risk. Put a yellow arm band on the pt and gave him a urinal. His cane is available upon request from the rn. Its in the nursing station or in the activity room.

## 2012-07-27 NOTE — ED Notes (Signed)
One bag pt belongings placed at nurse's station.  Pt also has one wood cane

## 2012-07-28 MED ORDER — DIMETHYL FUMARATE 240 MG PO CPDR
1.0000 | DELAYED_RELEASE_CAPSULE | Freq: Two times a day (BID) | ORAL | Status: DC
  Administered 2012-07-28: 1 via ORAL

## 2012-07-28 NOTE — [facility] Counselor (Signed)
Contacted EDP-Dr. Roselyn Bering regarding patients disposition. No telepsych is needed as Dr. Lynelle Doctor was made aware by this writer that patient contracted for safety and is no longer suicidal. Patient will return back to [facility] today. Patient sts that group home owner may not be available till 3pm or 3:30. Writer will contact [employer] at that time to inquire about when group home staff is available to pick patient up from [facility].

## 2012-07-28 NOTE — ED Provider Notes (Addendum)
Patient is here with depression and suicidal ideation. He is currently medically stable. Awaiting psychiatric placement. Filed Vitals:   07/28/12 0559  BP: 110/60  Pulse: 79  Temp: 97.6 F (36.4 C)  Resp: 20     Celene Kras, MD 07/28/12 0757  Pt is stable for discharge.  He denies SI at this time.  He is not sure what triggers these thoughts but he will contract for safety.  He called last night to get himself treatment and has been seen in the ED several times in the past.  Will dc home with plans for outpatient follow up.  Celene Kras, MD 07/28/12 1341

## 2012-07-28 NOTE — [facility] Assessment (Signed)
Assessment Note   [name] is an 57 y.o. male. Patient with history of depression arrived to [facility] via [facility]. He currently resides at [facility]. Sts that he has suicidal thoughts that "come and go". Patient was having thoughts yesterday with plan to overdose. Sts he called 911, quickly hung up changing his mind, and a dispatcher from 911 called back. Patient told the dispatcher that he was having suicidal thoughts but felt better and not longer needed help. Patient thought everything was ok until [employer] showed up at his home telling him that he had to come to [facility] for a mental health evaluation. Today patient denies suicidal thoughts and contracts for safety. He does not identify any triggers or stressors. He admits to previous suicidal thoughts with plans to overdose on pills or jump off a building. However, patient says, "I know those things aren't logical but I think about it time to time". He has a history of depression but says he is in a day program that helps him with coping skills.  He has been off of his Abilify and is compliant with his medication. Denies any homicidal ideation and also AVH's. Denies any recent alcohol or drug abuse.   Pt is pending a tele psych consult to determine further dis positioning.    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 Mental disorder   . Depression   . Neuromuscular disorder   . Multiple sclerosis, relapsing-remitting   . MS (multiple sclerosis)    Axis IV: other psychosocial or environmental problems, problems related to social environment and problems with access to health care services Axis V: 31-40 impairment in reality testing  Past Medical History:  Past Medical History  Diagnosis Date  . Mental disorder   . Depression   . Neuromuscular disorder   . Multiple sclerosis, relapsing-remitting   . MS (multiple sclerosis)      Past Surgical History  Procedure Laterality Date  . Tonsillectomy      Family History: No family history on file.  Social History:  reports that he has been smoking Cigarettes.  He has a 18 pack-year smoking history. He does not have any smokeless tobacco history on file. He reports that  drinks alcohol. He reports that he does not use illicit drugs.  Additional Social History:  Alcohol / Drug Use Pain Medications: SEE MAR Prescriptions: SEE MAR Over the Counter: SEE MAR History of alcohol / drug use?: No history of alcohol / drug abuse Longest period of sobriety (when/how long): n/a  CIWA: CIWA-Ar BP: 105/61 mmHg Pulse Rate: 63 COWS:    Allergies: No Known Allergies  Home Medications:  (Not in a hospital admission)  OB/GYN Status:  No LMP for male patient.  General Assessment Data [facility] of Assessment: [facility] Living Arrangements:  [facility] with 3 other residents) Can pt return to current living arrangement?: Yes Admission Status: Voluntary Is patient capable of signing voluntary admission?: Yes Transfer from: Acute Hospital Referral Source: Self/Family/Friend  Education Status Is patient currently in school?: No  Risk to self Suicidal Ideation: No-Not Currently/Within Last 6 Months (patient admits to SI yesterday; none today) Suicidal Intent: No-Not Currently/Within Last 6 Months Is patient at risk for suicide?: No Suicidal Plan?: No (no plan today; patient has on/off SI; plan yesterday to OD) Access to Means: No (patient denies access to pills; sts they are locked  up ) What has been your use of drugs/alcohol within the last 12 months?:  (No alcohol and/or drug use reported) Previous Attempts/Gestures: Yes How many times?:  ("multiple") Other Self Harm Risks:  (patient made plans but never followed thru with any plans) Triggers for Past Attempts: Other (Comment) (patient does not identify any stressors or triggers from pla) Intentional Self Injurious  Behavior: None Family Suicide History: No Recent stressful life event(s): Other (Comment) (patient does not identify any stressors) Persecutory voices/beliefs?: No Depression: Yes Depression Symptoms: Feeling worthless/self pity;Loss of interest in usual pleasures Substance abuse history and/or treatment for substance abuse?: No Suicide prevention information given to non-admitted patients: Not applicable  Risk to Others Homicidal Ideation: No Thoughts of Harm to Others: No Current Homicidal Intent: No Current Homicidal Plan: No Access to Homicidal Means: No Identified Victim:  (none reported) History of harm to others?: No Assessment of Violence: None Noted Violent Behavior Description:  (patient calm and cooperative) Does patient have access to weapons?: No Criminal Charges Pending?: No Does patient have a court date: No  Psychosis Hallucinations: None noted Delusions: None noted  Mental Status Report Appear/Hygiene: Disheveled Eye Contact: Good Motor Activity: Freedom of movement Speech: Logical/coherent Level of Consciousness: Alert Mood: Depressed Affect: Appropriate to circumstance Anxiety Level: None Thought Processes: Coherent;Relevant Judgement: Unimpaired Orientation: Person;Place;Time;Situation Obsessive Compulsive Thoughts/Behaviors: None  Cognitive Functioning Concentration: Normal Memory: Recent Intact;Remote Intact IQ: Average Insight: Good Impulse Control: Fair Appetite: Good Weight Loss:  (none reported) Weight Gain:  (none reported) Sleep: No Change Total Hours of Sleep:  (6-8 hours per night) Vegetative Symptoms: None  ADLScreening [facility] Assessment Services) Patient's cognitive ability adequate to safely complete daily activities?: Yes Patient able to express need for assistance with ADLs?: Yes Independently performs ADLs?: Yes (appropriate for developmental age)  Abuse/Neglect [facility]) Physical Abuse: Denies Verbal Abuse: Denies Sexual  Abuse: Denies  Prior Inpatient Therapy Prior Inpatient Therapy: Yes Prior Therapy Dates:  (patient unable to recall specific dates of previous admissio) Prior Therapy Facilty/Provider(s):  ([facility], [facility], and [facility]) Reason for Treatment:  (depression and suicidal thoughts)  Prior Outpatient Therapy Prior Outpatient Therapy: No Prior Therapy Dates:  (n/a) Prior Therapy Facilty/Provider(s):  (n/a) Reason for Treatment:  (n/a)  ADL Screening (condition at time of admission) Patient's cognitive ability adequate to safely complete daily activities?: Yes Patient able to express need for assistance with ADLs?: Yes Independently performs ADLs?: Yes (appropriate for developmental age) Weakness of Legs: None Weakness of Arms/Hands: None  Home Assistive Devices/Equipment Home Assistive Devices/Equipment: None  Therapy Consults (therapy consults require a physician order) PT Evaluation Needed: No OT Evalulation Needed: No SLP Evaluation Needed: No Abuse/Neglect Assessment (Assessment to be complete while patient is alone) Physical Abuse: Denies Verbal Abuse: Denies Sexual Abuse: Denies Exploitation of patient/patient's resources: Denies Self-Neglect: Denies Values / Beliefs Cultural Requests During Hospitalization: None Spiritual Requests During Hospitalization: None Consults Spiritual Care Consult Needed: No Social Work Consult Needed: No Merchant navy officer (For Healthcare) Advance Directive: Patient does not have advance directive Nutrition Screen- [facility] Adult/[facility] Patient's home diet: Regular  Additional Information 1:1 In Past 12 Months?: No CIRT Risk: No Elopement Risk: No Does patient have medical clearance?: Yes     Disposition:  Disposition Initial Assessment Completed for this Encounter: Yes Disposition of Patient: Other dispositions (Disposition pending a telepsych consult)  On Site Evaluation by:   Reviewed with Physician:     Octaviano Batty 07/28/2012 1:31 PM

## 2012-07-28 NOTE — BH Assessment (Signed)
[facility] Assessment Progress Note      Attempted to assess patient again.  Pt refused to answer any questions saying he was too tired.  Informed patient that an assessment was required to  evaluate his need for inpatient vs outpatient or refer him for placement.  Pt acknowledged that he understood.  Will continue to follow.

## 2012-08-01 ENCOUNTER — Emergency Department (HOSPITAL_COMMUNITY)
Admission: EM | Admit: 2012-08-01 | Discharge: 2012-08-02 | Disposition: A | Discharge status: Other Acute Inpatient Hospital | Payer: Medicare Other | Attending: Emergency Medicine | Admitting: Emergency Medicine

## 2012-08-01 ENCOUNTER — Telehealth (HOSPITAL_COMMUNITY): Payer: Self-pay | Admitting: Licensed Clinical Social Worker

## 2012-08-01 ENCOUNTER — Encounter (HOSPITAL_COMMUNITY): Payer: Self-pay | Admitting: Emergency Medicine

## 2012-08-01 DIAGNOSIS — Z79899 Other long term (current) drug therapy: Secondary | ICD-10-CM | POA: Insufficient documentation

## 2012-08-01 DIAGNOSIS — Z8669 Personal history of other diseases of the nervous system and sense organs: Secondary | ICD-10-CM | POA: Insufficient documentation

## 2012-08-01 DIAGNOSIS — F489 Nonpsychotic mental disorder, unspecified: Secondary | ICD-10-CM | POA: Insufficient documentation

## 2012-08-01 DIAGNOSIS — R45851 Suicidal ideations: Secondary | ICD-10-CM

## 2012-08-01 DIAGNOSIS — F32A Depression, unspecified: Secondary | ICD-10-CM

## 2012-08-01 DIAGNOSIS — G35 Multiple sclerosis: Secondary | ICD-10-CM

## 2012-08-01 DIAGNOSIS — Z5900 Homelessness unspecified: Secondary | ICD-10-CM

## 2012-08-01 DIAGNOSIS — F329 Major depressive disorder, single episode, unspecified: Secondary | ICD-10-CM | POA: Insufficient documentation

## 2012-08-01 DIAGNOSIS — F17200 Nicotine dependence, unspecified, uncomplicated: Secondary | ICD-10-CM | POA: Insufficient documentation

## 2012-08-01 DIAGNOSIS — F3289 Other specified depressive episodes: Secondary | ICD-10-CM | POA: Insufficient documentation

## 2012-08-01 LAB — RAPID URINE DRUG SCREEN, HOSP PERFORMED
Amphetamines: NOT DETECTED
Barbiturates: NOT DETECTED
Benzodiazepines: NOT DETECTED
Cocaine: NOT DETECTED
Opiates: NOT DETECTED
Tetrahydrocannabinol: NOT DETECTED

## 2012-08-01 LAB — COMPREHENSIVE METABOLIC PANEL
ALT: 15 U/L (ref 0–53)
AST: 18 U/L (ref 0–37)
Albumin: 4.1 g/dL (ref 3.5–5.2)
Alkaline Phosphatase: 66 U/L (ref 39–117)
BUN: 14 mg/dL (ref 6–23)
CO2: 28 mEq/L (ref 19–32)
Calcium: 8.9 mg/dL (ref 8.4–10.5)
Chloride: 103 mEq/L (ref 96–112)
Creatinine, Ser: 0.96 mg/dL (ref 0.50–1.35)
GFR calc Af Amer: >90 mL/min (ref >90)
GFR calc non Af Amer: >90 mL/min (ref >90)
Glucose, Bld: 79 mg/dL (ref 70–99)
Potassium: 4.6 mEq/L (ref 3.5–5.1)
Sodium: 138 mEq/L (ref 135–145)
Total Bilirubin: 0.3 mg/dL (ref 0.3–1.2)
Total Protein: 7.2 g/dL (ref 6.0–8.3)

## 2012-08-01 LAB — CBC
HCT: 41 % (ref 39.0–52.0)
Hemoglobin: 14 g/dL (ref 13.0–17.0)
MCH: 30.5 pg (ref 26.0–34.0)
MCHC: 34.1 g/dL (ref 30.0–36.0)
MCV: 89.3 fL (ref 78.0–100.0)
Platelets: 194 10*3/uL (ref 150–400)
RBC: 4.59 MIL/uL (ref 4.22–5.81)
RDW: 14.9 % (ref 11.5–15.5)
WBC: 6 10*3/uL (ref 4.0–10.5)

## 2012-08-01 LAB — SALICYLATE LEVEL: Salicylate Lvl: <2 mg/dL — ABNORMAL LOW (ref 2.8–20.0)

## 2012-08-01 LAB — ETHANOL: Alcohol, Ethyl (B): <11 mg/dL (ref 0–11)

## 2012-08-01 LAB — ACETAMINOPHEN LEVEL: Acetaminophen (Tylenol), Serum: <15 ug/mL (ref 10–30)

## 2012-08-01 MED ORDER — BUPROPION HCL ER (XL) 150 MG PO TB24
150.0000 mg | ORAL_TABLET | Freq: Every morning | ORAL | Status: DC
  Filled 2012-08-01: qty 1

## 2012-08-01 MED ORDER — NICOTINE 21 MG/24HR TD PT24: 21.0000 mg | MEDICATED_PATCH | Freq: Every day | TRANSDERMAL | Status: DC

## 2012-08-01 MED ORDER — ONDANSETRON HCL 4 MG PO TABS: 4.0000 mg | ORAL_TABLET | Freq: Three times a day (TID) | ORAL | Status: DC | PRN

## 2012-08-01 MED ORDER — BENZTROPINE MESYLATE 1 MG PO TABS: 0.5000 mg | ORAL_TABLET | Freq: Two times a day (BID) | ORAL | Status: DC

## 2012-08-01 MED ORDER — DIMETHYL FUMARATE 120 MG PO CPDR: 1.0000 | DELAYED_RELEASE_CAPSULE | Freq: Two times a day (BID) | ORAL | Status: DC

## 2012-08-01 MED ORDER — ALUM & MAG HYDROXIDE-SIMETH 200-200-20 MG/5ML PO SUSP: 30.0000 mL | ORAL | Status: DC | PRN

## 2012-08-01 MED ORDER — ESCITALOPRAM OXALATE 10 MG PO TABS: 10.0000 mg | ORAL_TABLET | Freq: Every morning | ORAL | Status: DC

## 2012-08-01 MED ORDER — BENZTROPINE MESYLATE 1 MG PO TABS
0.5000 mg | ORAL_TABLET | Freq: Two times a day (BID) | ORAL | Status: DC
  Administered 2012-08-01: 0.5 mg via ORAL
  Filled 2012-08-01: qty 1

## 2012-08-01 MED ORDER — ARIPIPRAZOLE 2 MG PO TABS
2.0000 mg | ORAL_TABLET | Freq: Every morning | ORAL | Status: DC
  Filled 2012-08-01: qty 1

## 2012-08-01 MED ORDER — ZOLPIDEM TARTRATE 5 MG PO TABS: 5.0000 mg | ORAL_TABLET | Freq: Every evening | ORAL | Status: DC | PRN

## 2012-08-01 MED ORDER — ACETAMINOPHEN 325 MG PO TABS: 650.0000 mg | ORAL_TABLET | ORAL | Status: DC | PRN

## 2012-08-01 MED ORDER — IBUPROFEN 600 MG PO TABS: 600.0000 mg | ORAL_TABLET | Freq: Three times a day (TID) | ORAL | Status: DC | PRN

## 2012-08-01 NOTE — ED Notes (Addendum)
Pt said he feels the same way he did this past weekend when he was here and that he pretty much always feels this way but he doesn't know what causes the feelings. He's not sure that he would be better off dead but has had numerous suicide attempts. He said he's tired of coming to the hospital. He said the group home could be worse. He was placed in the [facility] due to being homeless. Last psych admission was last year. Pt has a cane but doesn't need it due to having MS.

## 2012-08-01 NOTE — ED Provider Notes (Signed)
Medical screening examination/treatment/procedure(s) were performed by non-physician practitioner and as supervising physician I was immediately available for consultation/collaboration.   Nayelie Gionfriddo L Michaeleen Down, MD 08/01/12 2057 

## 2012-08-01 NOTE — ED Notes (Signed)
Recording said [phone] isn't accepting phone calls at this time.

## 2012-08-01 NOTE — ED Provider Notes (Signed)
History    This chart was scribed for non-physician practitioner working with Benny Lennert, MD by Frederik Pear, ED Scribe. This patient was seen in room [facility] and the patient's care was started at 1654.   CSN: [id number]  Arrival date & time 08/01/12  1647   First MD Initiated Contact with Patient 08/01/12 1654      Chief Complaint  Patient presents with  . Medical Clearance    (Consider location/radiation/quality/duration/timing/severity/associated sxs/prior treatment) The history is provided by the patient, medical records and the nursing home. No language interpreter was used.   [name] is a 57 y.o. male with a h/o of SI and MDD who presents to the Emergency Department complaining of medical clearance for SI with a plan. He reports that he has always suffered from depression and SI, and the current ideation are no different than previous instances. He states that he live in a group home off of [facility] and planned to purchase sleeping pill at [facility] and get a room at the [facility] and take the whole bottle. He denies any HI or hallucinations. He has a h/o of admissions to [facility] with the most recent on 02/17/12. He has a h/o of MS and is currently stable and takes Tecfidera. He denies any substance abuse and states that he consumes ETOH very rarely. He denies having any medical concerns in the ED.  Past Medical History  Diagnosis Date  . Mental disorder   . Depression   . Neuromuscular disorder   . Multiple sclerosis, relapsing-remitting   . MS (multiple sclerosis)     Past Surgical History  Procedure Laterality Date  . Tonsillectomy      History reviewed. No pertinent family history.  History  Substance Use Topics  . Smoking status: Current Every Day Smoker -- 0.50 packs/day for 36 years    Types: Cigarettes  . Smokeless tobacco: Not on file  . Alcohol Use: Yes     Comment: "hardly drink except when I get into a hotel room, probably every 6 months or so"       Review of Systems  Psychiatric/Behavioral: Positive for suicidal ideas.  All other systems reviewed and are negative.    Allergies  Review of patient's allergies indicates no known allergies.  Home Medications   Current Outpatient Rx  Name  Route  Sig  Dispense  Refill  . ARIPiprazole (ABILIFY) 2 MG tablet   Oral   Take 2 mg by mouth every morning.         . benztropine (COGENTIN) 0.5 MG tablet   Oral   Take 0.5 mg by mouth 2 (two) times daily.         [name] buPROPion (WELLBUTRIN XL) 150 MG 24 hr tablet   Oral   Take 150 mg by mouth every morning.         . Dimethyl Fumarate (TECFIDERA) 120 MG CPDR   Oral   Take 1 tablet by mouth 2 (two) times daily.         [name] escitalopram (LEXAPRO) 10 MG tablet   Oral   Take 10 mg by mouth every morning.           BP 110/67  Pulse 71  Temp(Src) 98 F (36.7 C) (Oral)  Resp 18  SpO2 96%  Physical Exam  Nursing note and vitals reviewed. Constitutional: He is oriented to person, place, and time. He appears well-developed and well-nourished. No distress.  HENT:  Head: Normocephalic  and atraumatic.  Eyes: EOM are normal. Pupils are equal, round, and reactive to light.  Neck: Normal range of motion. Neck supple. No tracheal deviation present.  Cardiovascular: Normal rate.   Pulmonary/Chest: Effort normal. No respiratory distress.  Abdominal: Soft. He exhibits no distension.  Musculoskeletal: Normal range of motion. He exhibits no edema.  Neurological: He is alert and oriented to person, place, and time.  Skin: Skin is warm and dry.  Psychiatric: He has a normal mood and affect. His behavior is normal.    ED Course  Procedures (including critical care time)  DIAGNOSTIC STUDIES: Oxygen Saturation is 97% on room air, adequate by my interpretation.    COORDINATION OF CARE:  17:35- Discussed planned course of treatment with the patient, including an ACT consult, who is agreeable at this time.  Labs Reviewed   SALICYLATE LEVEL - Abnormal; Notable for the following:    Salicylate Lvl <2.0 (*)    All other components within normal limits  ACETAMINOPHEN LEVEL  CBC  COMPREHENSIVE METABOLIC PANEL  ETHANOL  URINE RAPID DRUG SCREEN (HOSP PERFORMED)   No results found.   1. Depression   2. Suicidal ideation   3. Homelessness   4. Multiple sclerosis       MDM   Date: 08/01/2012  Rate: 67  Rhythm: normal sinus rhythm  QRS Axis: normal  Intervals: normal  ST/T Wave abnormalities: normal  Conduction Disutrbances:none  Narrative Interpretation:   Old EKG Reviewed: unchanged October 06, 2011  ACT consult, holding orders placed, labs reviewed.        Dorthula Matas, PA-C 08/01/12 1959

## 2012-08-01 NOTE — ED Notes (Signed)
Spoke to Powell at [facility] to let her know we cannot provide dimethyl fumarate CPDR1 for pt and have tried to contact the [facility] to get the medicine to no avail. She will try to contact [facility] and call me back.

## 2012-08-01 NOTE — ED Notes (Signed)
Still unable to reach supervisor but the phone is no longer going to voicemail it stops ringing but nobody says anything. Called Caryn Bee the caretaker at [facility] [phone] and asked him to contact supervisor and have him call me.

## 2012-08-01 NOTE — ED Notes (Signed)
Caryn Bee caretaker at [facility] [phone] said to call supervisor Thereasa Distance [phone] and he would take care of getting medication to [place].

## 2012-08-01 NOTE — ED Notes (Signed)
[facility] called to see what PSR was, pt said it's psychosocial rehabilitation.

## 2012-08-01 NOTE — ED Notes (Signed)
Pharmacy called and doesn't have Dimethyl Fumarate CPDR1 in stock. They asked if someone could bring the medicine in for the pt. Tried to call the [facility] [phone] and the number is busy.

## 2012-08-01 NOTE — ED Notes (Signed)
Left message on [phone] stating we have a client here from one of their facilities that is in need of medicine we are unable to provide and also our phone number.

## 2012-08-01 NOTE — ED Notes (Signed)
Charlane Ferretti Mcfadden said that she hasn't spoke to any [facility] worker today and hasn't been able to reach them all day and she cannot help get his medication.

## 2012-08-01 NOTE — ED Notes (Signed)
Marcelino Duster from [facility] called and said she can't get a hold of [facility] either, she said to try his legal guardian and [employer] [phone] provider debra frisco. She will also contact worker Belenda Cruise that brought him in and see if she has additional information.

## 2012-08-01 NOTE — ED Notes (Signed)
Per Leann in pharmacy there's no substitute. Thereasa Distance the supervisor at [facility] called back and said that he's covering and it would be him that would bring medication and he cannot bring it until 10-11 am tomorrow morning. If pt is transferred we need to call and let him know at [phone]

## 2012-08-02 ENCOUNTER — Encounter (HOSPITAL_COMMUNITY): Payer: Self-pay | Admitting: *Deleted

## 2012-08-02 ENCOUNTER — Inpatient Hospital Stay (HOSPITAL_COMMUNITY)
Admission: RE | Admit: 2012-08-02 | Discharge: 2012-08-08 | DRG: 885 | Payer: 59 | Source: Ambulatory Visit | Attending: Psychiatry | Admitting: Psychiatry

## 2012-08-02 DIAGNOSIS — Z79899 Other long term (current) drug therapy: Secondary | ICD-10-CM

## 2012-08-02 DIAGNOSIS — R45851 Suicidal ideations: Secondary | ICD-10-CM

## 2012-08-02 DIAGNOSIS — F341 Dysthymic disorder: Secondary | ICD-10-CM

## 2012-08-02 DIAGNOSIS — G35 Multiple sclerosis: Secondary | ICD-10-CM | POA: Diagnosis present

## 2012-08-02 DIAGNOSIS — F332 Major depressive disorder, recurrent severe without psychotic features: Principal | ICD-10-CM | POA: Diagnosis present

## 2012-08-02 DIAGNOSIS — J9601 Acute respiratory failure with hypoxia: Secondary | ICD-10-CM

## 2012-08-02 MED ORDER — ARIPIPRAZOLE 2 MG PO TABS
2.0000 mg | ORAL_TABLET | Freq: Every day | ORAL | Status: DC
  Administered 2012-08-02 – 2012-08-08 (×7): 2 mg via ORAL
  Filled 2012-08-02 (×3): qty 1
  Filled 2012-08-02: qty 3
  Filled 2012-08-02 (×5): qty 1

## 2012-08-02 MED ORDER — BENZTROPINE MESYLATE 0.5 MG PO TABS
0.5000 mg | ORAL_TABLET | Freq: Two times a day (BID) | ORAL | Status: DC
  Administered 2012-08-02 – 2012-08-08 (×13): 0.5 mg via ORAL
  Filled 2012-08-02: qty 1
  Filled 2012-08-02: qty 6
  Filled 2012-08-02 (×3): qty 1
  Filled 2012-08-02: qty 6
  Filled 2012-08-02 (×11): qty 1

## 2012-08-02 MED ORDER — ACETAMINOPHEN 325 MG PO TABS: 650.0000 mg | ORAL_TABLET | Freq: Four times a day (QID) | ORAL | Status: DC | PRN

## 2012-08-02 MED ORDER — ALUM & MAG HYDROXIDE-SIMETH 200-200-20 MG/5ML PO SUSP: 30.0000 mL | ORAL | Status: DC | PRN

## 2012-08-02 MED ORDER — ESCITALOPRAM OXALATE 10 MG PO TABS
10.0000 mg | ORAL_TABLET | Freq: Every day | ORAL | Status: DC
  Administered 2012-08-02 – 2012-08-08 (×7): 10 mg via ORAL
  Filled 2012-08-02 (×6): qty 1
  Filled 2012-08-02: qty 3
  Filled 2012-08-02 (×2): qty 1

## 2012-08-02 MED ORDER — MAGNESIUM HYDROXIDE 400 MG/5ML PO SUSP: 30.0000 mL | Freq: Every day | ORAL | Status: DC | PRN

## 2012-08-02 MED ORDER — BUPROPION HCL ER (XL) 150 MG PO TB24
150.0000 mg | ORAL_TABLET | Freq: Every day | ORAL | Status: DC
  Administered 2012-08-02 – 2012-08-08 (×7): 150 mg via ORAL
  Filled 2012-08-02: qty 3
  Filled 2012-08-02 (×9): qty 1
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
## 2012-08-02 NOTE — ED Notes (Signed)
Called TA to let them know pt is accepted to [facility]. Tried to call Thereasa Distance the supervisor at [facility] [phone] to let him know pt being transferred and to take MS medication Dimethyl Fumarate to [facility] tomorrow morning but again it seems someone answers but doesn't say anything so RN unable to leave message.
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
## 2012-08-02 NOTE — Progress Notes (Signed)
Pt was more pleasant on this encounter than previous encounters. When asked the circumstances surrounding her adm pt stated that, "he's tired of making the same mistake all the time".  When asked pt stated he was referring to overdosing on pills and then calling someone to help". Pt stated, "last week I called 911. I didn't know they could trace you off your cell phone". Pt stated, he just wanted somebody to talk to. Pt lives at a group home and plans to return there after discharge. Going over home meds pt stated that "the abilify is too damn expensive and I can't afford that shit". Stated he's been refusing and hopes that he can be prescribed something cheaper to take the place. Pt has MS and uses a cane to help him ambulate. Was first diagnosed in 2000. Pt denied SI, HI, and A/V during the adm process.

## 2012-08-02 NOTE — Tx Team (Signed)
Initial Interdisciplinary Treatment Plan  PATIENT STRENGTHS: (choose at least two) Ability for insight Active sense of humor Average or above average intelligence Capable of independent living Communication skills General fund of knowledge Motivation for treatment/growth  PATIENT STRESSORS: Financial difficulties Health problems Medication change or noncompliance   PROBLEM LIST: Problem List/Patient Goals Date to be addressed Date deferred Reason deferred Estimated date of resolution  "I'd like to find a way to keep from doing the same shit over again" Referring to att to OD on sleeping pills 08/02/12                 Depression 08/02/12     Increased risk of suicide 08/02/12     Medical condition: MS 08/02/12                        DISCHARGE CRITERIA:  Ability to meet basic life and health needs Adequate post-discharge living arrangements Improved stabilization in mood, thinking, and/or behavior Medical problems require only outpatient monitoring Motivation to continue treatment in a less acute level of care Need for constant or close observation no longer present Reduction of life-threatening or endangering symptoms to within safe limits Safe-care adequate arrangements made Verbal commitment to aftercare and medication compliance  PRELIMINARY DISCHARGE PLAN: Outpatient therapy Return to previous living arrangement  PATIENT/FAMIILY INVOLVEMENT: This treatment plan has been presented to and reviewed with the patient, [name], and/or family member.  The patient and family have been given the opportunity to ask questions and make suggestions.  Fransico Michael [facility] 08/02/2012, 3:47 AM
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
## 2012-08-02 NOTE — [facility] Group Notes (Signed)
[facility] LCSW Group Therapy  08/02/2012 3-4pm  Summary of Progress/Problems: Summary of Progress/Problems: The main focus of today's process group was for the patient to identify something in their life related to their hospitalization that they would like to change. The concept of "normal" was discussed, as well as the concept of "wellness" and what that means to different people. We also discussed the use of music as a component of wellness. The patient stated that he keeps making suicidal gestures, and he does not understand why he keeps doing it.  He said that he did not think the police could track a cell phone, but he called 911 and within less than 3 minutes the police were there.   The patient had significant difficulty talking, and was quite frustrated, saying this was new to him.  Additionally, he has been encouraged by Dr. Dub Mikes to stay on his Abilify, but the co-pay is almost $200.  CSW told him about email received this week about $25 per month Abilify being available through the company.  Type of Therapy:  Group Therapy  Participation Level:  Active  Participation Quality:  Appropriate, Attentive, Sharing and Supportive  Affect:  Blunted and Depressed  Cognitive:  Alert, Appropriate and Oriented  Insight:  Engaged  Engagement in Therapy:  Engaged  Modes of Intervention:  Discussion, Exploration and Problem-solving  Sarina Ser 08/02/2012, 4:25 PM

## 2012-08-02 NOTE — Progress Notes (Signed)
[name] remains sad, depressed and flat. HE is insightful into his depression, he is able to talk about his feelings and sadness. HE takes his scheduled meds as ordered. HE completes his AM self inventory and on it he writes he rates his depression and hopelessness " 5 / 5 " and he states he has had SI wihin the past 24 hrs, but he contracts with this nurse to stay safe today.   A He attends his groups as scheduled, he is engaged in the discussion and work and he demonstrates understanding into his illness. HE says " I always learn something new when I come to your group".   R Safety si in place and POC moves forward with therapeutic relationship fostered.

## 2012-08-02 NOTE — ED Notes (Signed)
When RN spoke to supervisor earlier he did say that pt doesn't have a guardian.

## 2012-08-03 MED ORDER — DIMETHYL FUMARATE 240 MG PO CPDR
240.0000 mg | DELAYED_RELEASE_CAPSULE | Freq: Two times a day (BID) | ORAL | Status: DC
  Administered 2012-08-03 – 2012-08-08 (×6): 240 mg via ORAL

## 2012-08-03 MED ORDER — DIMETHYL FUMARATE 120 MG PO CPDR: 1.0000 | DELAYED_RELEASE_CAPSULE | Freq: Two times a day (BID) | ORAL | Status: DC

## 2012-08-03 NOTE — Progress Notes (Signed)
[facility] Group Notes:  (Nursing/MHT/Case Management/Adjunct)  Date:  08/02/2012 Time:  2000  Type of Therapy:  Psychoeducational Skills  Participation Level:  Minimal  Participation Quality:  Resistant  Affect:  Depressed  Cognitive:  Lacking  Insight:  Lacking  Engagement in Group:  Lacking  Modes of Intervention:  Education  Summary of Progress/Problems: The patient stated that he had a "boring day". He would not answer any additional questions nor would he elaborate.   Shamarr Faucett S 08/03/2012, 1:41 AM

## 2012-08-03 NOTE — Progress Notes (Signed)
Writer observed patient sitting in the dayroom watching tv with minimal interaction with peers. Writer spoke with patient who reports that his day has been all right. Patient reports that he has passive si and verbally contracts for safety. Patient feels that he has made progress because he called and let someone know about his suicidal thoughts before he acted on them like he has in the past. Patient was informed of his medications and reports that he spoke with the doctor concerning his abilify and how expense it is and his doctor informed him of a program that assist patients with the cost. Patient denies hi/a/v hallucinations. Safety maintained on unit with 15 min checks, will continue to monitor.

## 2012-08-03 NOTE — Progress Notes (Signed)
Patient has been up in the dayroom watching tv and attended group this evening. Patient was informed by writer that his MS medication had been brought in and he was to receive and dose this evening. Patient took his medication and prepared for bed. Patient reports passive si and verbally contracts for safety. Patient denies hi/a/v hallucinatons. Support and encouragement offered, safety maintained on unit, will continue to monitor. Patient informed that he has only 3 capsules left in his medication bottle brought in and he replied that he hopes that he will only be here for a couple more days before he is discharged.

## 2012-08-03 NOTE — Progress Notes (Signed)
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   HENT: Negative.   Eyes: Negative.   Respiratory: Negative.   Cardiovascular: Negative.   Gastrointestinal: Negative.   Genitourinary: Negative.   Musculoskeletal: Negative.   Skin: Negative.   Neurological: Positive for sensory change.  Endo/Heme/Allergies: Negative.   Psychiatric/Behavioral: Positive for depression and suicidal ideas. Negative for substance abuse.    Blood pressure 97/61, pulse 88, temperature 97.4 F (36.3 C), temperature source Oral, resp. rate 16, height 5\' 11"  (1.803 m), weight 181 lb (82.101 kg).Body mass index is 25.26 kg/(m^2).  General Appearance: Well Groomed  Patent attorney::  Good  Speech:  Normal Rate  Volume:  Normal  Mood:  Euthymic  Affect:  Blunt  Thought Process:  Coherent  Orientation:  Full (Time, Place, and Person)  Thought Content:  WDL  Suicidal Thoughts:  Yes.  without intent/plan  Homicidal Thoughts:  No  Memory:  Negative  Judgement:  Intact  Insight:  Fair  Psychomotor Activity:  Normal  Concentration:  Good  Recall:  Good  Akathisia:  Negative  Handed:  Right  AIMS (if indicated):     Assets:  Desire for Improvement  Sleep:  Number of Hours: 6.75   Current Medications: Current Facility-Administered Medications  Medication Dose Route Frequency Provider Last Rate Last Dose  . acetaminophen (TYLENOL) tablet 650 mg  650 mg Oral Q6H PRN Jorje Guild, PA-C      . alum & mag hydroxide-simeth (MAALOX/MYLANTA) 200-200-20 MG/5ML suspension 30 mL  30 mL Oral  Q4H PRN Jorje Guild, PA-C      . ARIPiprazole (ABILIFY) tablet 2 mg  2 mg Oral Daily Jorje Guild, PA-C   2 mg at 08/03/12 0846  . benztropine (COGENTIN) tablet 0.5 mg  0.5 mg Oral BID Jorje Guild, PA-C   0.5 mg at 08/03/12 0846  . buPROPion (WELLBUTRIN XL) 24 hr tablet 150 mg  150 mg Oral Daily Jorje Guild, PA-C   150 mg at 08/03/12 0846  . escitalopram (LEXAPRO) tablet 10 mg  10 mg Oral Daily Jorje Guild, PA-C   10 mg at 08/03/12 0846  . magnesium hydroxide (MILK OF MAGNESIA) suspension 30 mL  30 mL Oral Daily PRN Jorje Guild, PA-C        Lab Results:  Results for orders placed during the hospital encounter of 08/01/12 (from the past 48 hour(s))  URINE RAPID DRUG SCREEN (HOSP PERFORMED)     Status: None   Collection Time    08/01/12  5:13 PM      Result Value Range   Opiates NONE DETECTED  NONE DETECTED   Cocaine NONE DETECTED  NONE DETECTED   Benzodiazepines NONE DETECTED  NONE DETECTED   Amphetamines NONE DETECTED  NONE DETECTED   Tetrahydrocannabinol NONE DETECTED  NONE DETECTED   Barbiturates NONE DETECTED  NONE DETECTED   Comment:            DRUG SCREEN FOR MEDICAL PURPOSES     ONLY.  IF CONFIRMATION IS NEEDED     FOR  ANY PURPOSE, NOTIFY LAB     WITHIN 5 DAYS.                LOWEST DETECTABLE LIMITS     FOR URINE DRUG SCREEN     Drug Class       Cutoff (ng/mL)     Amphetamine      1000     Barbiturate      200     Benzodiazepine   200     Tricyclics       300     Opiates          300     Cocaine          300     THC              50  ACETAMINOPHEN LEVEL     Status: None   Collection Time    08/01/12  5:29 PM      Result Value Range   Acetaminophen (Tylenol), Serum <15.0  10 - 30 ug/mL   Comment:            THERAPEUTIC CONCENTRATIONS VARY     SIGNIFICANTLY. A RANGE OF 10-30     ug/mL MAY BE AN EFFECTIVE     CONCENTRATION FOR MANY PATIENTS.     HOWEVER, SOME ARE BEST TREATED     AT CONCENTRATIONS OUTSIDE THIS     RANGE.     ACETAMINOPHEN CONCENTRATIONS     >150 ug/mL AT 4  HOURS AFTER     INGESTION AND >50 ug/mL AT 12     HOURS AFTER INGESTION ARE     OFTEN ASSOCIATED WITH TOXIC     REACTIONS.  CBC     Status: None   Collection Time    08/01/12  5:29 PM      Result Value Range   WBC 6.0  4.0 - 10.5 K/uL   RBC 4.59  4.22 - 5.81 MIL/uL   Hemoglobin 14.0  13.0 - 17.0 g/dL   HCT 04.5  40.9 - 81.1 %   MCV 89.3  78.0 - 100.0 fL   MCH 30.5  26.0 - 34.0 pg   MCHC 34.1  30.0 - 36.0 g/dL   RDW 91.4  78.2 - 95.6 %   Platelets 194  150 - 400 K/uL  COMPREHENSIVE METABOLIC PANEL     Status: None   Collection Time    08/01/12  5:29 PM      Result Value Range   Sodium 138  135 - 145 mEq/L   Potassium 4.6  3.5 - 5.1 mEq/L   Comment: SLIGHT HEMOLYSIS   Chloride 103  96 - 112 mEq/L   CO2 28  19 - 32 mEq/L   Glucose, Bld 79  70 - 99 mg/dL   BUN 14  6 - 23 mg/dL   Creatinine, Ser 2.13  0.50 - 1.35 mg/dL   Calcium 8.9  8.4 - 08.6 mg/dL   Total Protein 7.2  6.0 - 8.3 g/dL   Albumin 4.1  3.5 - 5.2 g/dL   AST 18  0 - 37 U/L   ALT 15  0 - 53 U/L   Alkaline Phosphatase 66  39 - 117 U/L   Total Bilirubin 0.3  0.3 - 1.2 mg/dL   GFR calc non Af Amer >90  >90 mL/min   GFR calc Af Amer >90  >90 mL/min   Comment:  The eGFR has been calculated     using the CKD EPI equation.     This calculation has not been     validated in all clinical     situations.     eGFR's persistently     <90 mL/min signify     possible Chronic Kidney Disease.  ETHANOL     Status: None   Collection Time    08/01/12  5:29 PM      Result Value Range   Alcohol, Ethyl (B) <11  0 - 11 mg/dL   Comment:            LOWEST DETECTABLE LIMIT FOR     SERUM ALCOHOL IS 11 mg/dL     FOR MEDICAL PURPOSES ONLY  SALICYLATE LEVEL     Status: Abnormal   Collection Time    08/01/12  5:29 PM      Result Value Range   Salicylate Lvl <2.0 (*) 2.8 - 20.0 mg/dL    Physical Findings: AIMS: Facial and Oral Movements Muscles of Facial Expression: None, normal Lips and Perioral Area: None,  normal Jaw: None, normal Tongue: None, normal,Extremity Movements Upper (arms, wrists, hands, fingers): None, normal Lower (legs, knees, ankles, toes): None, normal, Trunk Movements Neck, shoulders, hips: None, normal, Overall Severity Severity of abnormal movements (highest score from questions above): None, normal Incapacitation due to abnormal movements: None, normal Patient's awareness of abnormal movements (rate only patient's report): No Awareness, Dental Status Current problems with teeth and/or dentures?: No Does patient usually wear dentures?: No  CIWA:    COWS:     Treatment Plan Summary: Daily contact with patient to assess and evaluate symptoms and progress in treatment  Plan:Per Dr Dub Mikes Has done well on Abilify. Could not afford it. Will resume the Abilify and encourage the agency to fill a patient assistance program form for the Abilify Medical Decision Making Problem Points:  Established problem, stable/improving (1) Data Points:  Review of medication regiment & side effects (2)  I certify that inpatient services furnished can reasonably be expected to improve the patient's condition.   Maryjean Morn E 08/03/2012, 1:30 PM

## 2012-08-03 NOTE — Progress Notes (Signed)
[facility] Group Notes:  (Nursing/MHT/Case Management/Adjunct)  Date:  08/03/2012  Time:  2000  Type of Therapy:  Psychoeducational Skills  Participation Level:  Minimal  Participation Quality:  Inattentive  Affect:  Depressed  Cognitive:  Appropriate  Insight:  Lacking  Engagement in Group:  Lacking  Modes of Intervention:  Education  Summary of Progress/Problems:The patient had very little to share with the group except to say that his went "okay". His goal for tomorrow is to get more rest.   Lucianne Smestad S 08/03/2012, 11:57 PM

## 2012-08-03 NOTE — [facility] Group Notes (Signed)
[facility] LCSW Group Therapy  08/03/2012 3:15-4:10pm  Summary of Progress/Problems:  The main focus of today's process group was to define "support" and describe what healthy supports are.  We then discussed how and why to increase patient supports, using motivational interviewing.  An activity was done to demonstrate strength in numbers greater than 1, and we discussed that supports can hold [place] accountable to our wellness goals.  Several patients were resistant to this idea and there was a lively discussion.   The patient expressed himself in an outspoken manner throughout group.  He does not feel he has supports currently, even with CSW reminding him of several.    Type of Therapy:  Group Therapy  Participation Level:  Active  Participation Quality:  Appropriate, Attentive and Sharing  Affect:  Blunted and Depressed  Cognitive:  Appropriate and Oriented  Insight:  Developing/Improving  Engagement in Therapy:  Engaged  Modes of Intervention:  Discussion and Exploration   Sarina Ser 08/03/2012, 5:02 PM

## 2012-08-04 DIAGNOSIS — F411 Generalized anxiety disorder: Secondary | ICD-10-CM

## 2012-08-04 MED ORDER — ALBUTEROL SULFATE HFA 108 (90 BASE) MCG/ACT IN AERS
2.0000 | INHALATION_SPRAY | Freq: Four times a day (QID) | RESPIRATORY_TRACT | Status: DC
  Administered 2012-08-05 – 2012-08-08 (×14): 2 via RESPIRATORY_TRACT
  Filled 2012-08-04: qty 6.7

## 2012-08-04 NOTE — Progress Notes (Signed)
Adult Psychoeducational Group Note  Date:  08/04/2012 Time:  9:44 PM  Group Topic/Focus:  Identifying Needs:   The focus of this group is to help patients identify their personal needs that have been historically problematic and identify healthy behaviors to address their needs.  Participation Level:  Active  Participation Quality:  Appropriate  Affect:  Appropriate  Cognitive:  Appropriate  Insight: Appropriate  Engagement in Group:  Supportive  Modes of Intervention:  Problem-solving  Additional Comments:  Mr. Dente was able to identify his needs and understood what was not working for him.   Annell Greening Thornton 08/04/2012, 9:44 PM

## 2012-08-04 NOTE — [facility] Group Notes (Signed)
[facility] Group Notes Aftercare Group   Date:  08/04/2012  Time:  3:55 PM  Type of Therapy:  Group Therapy  Participation Level:  Minimal  Participation Quality:  Appropriate  Affect:  Appropriate and Depressed  Cognitive:  Appropriate  Insight:  Appropriate  Engagement in Group:  Developing/Improving and Engaged  Modes of Intervention:  Discussion, Education, Exploration, Problem-solving, Rapport Building and Support   Summary of Progress/Problems:   Patient reports suicidal ideation with plan and means. Patient's plan was to purchase over the counter medications and combine them with alcohol in a motel room.  Patient reports he plans to return to [facility] and will follow up with [facility] for outpatient services.   [name] 08/04/2012, 3:55 PM

## 2012-08-04 NOTE — Progress Notes (Signed)
Patient ID: [name], male   DOB: 18-Dec-1955, 57 y.o.   MRN: [id number] He has been up and to groups interacting some with peers and staff.  He denies pain and His self inventory: depression 3, hopelessness 8 and SI thoughts off and on.

## 2012-08-04 NOTE — Tx Team (Signed)
Interdisciplinary Treatment Plan Update   Date Reviewed:  08/04/2012  Time Reviewed:  9:48 AM  Progress in Treatment:   Attending groups: Yes Participating in groups: Yes Taking medication as prescribed: Yes  Tolerating medication: Yes Family/Significant other contact made: No contact made at this time. Patient understands diagnosis: Yes  Discussing patient identified problems/goals with staff: Yes Medical problems stabilized or resolved: Yes Denies suicidal/homicidal ideation: Yes Patient has not harmed self or others: Yes  For review of initial/current patient goals, please see plan of care.  Estimated Length of Stay:    Reasons for Continued Hospitalization:  Anxiety Depression Medication stabilization Suicidal ideation  New Problems/Goals identified:    Discharge Plan or Barriers:   Home with outpatient follow up  Additional Comments:  Patient reports admitting with suicidal ideation with plan and means. Patient's plan was to purchase over the counter medications and combine them with alcohol in a motel room.  Patient is denying SI today and rates depression at and anxiety at five.   Attendees:  Patient:  08/04/2012 9:48 AM   Signature: Patrick North, MD 08/04/2012 9:48 AM  Signature: 08/04/2012 9:48 AM  Signature: Roswell Miners, Rn 08/04/2012 9:48 AM  Signature:Beverly Terrilee Croak, RN 08/04/2012 9:48 AM  Signature:  Neill Loft RN 08/04/2012 9:48 AM  Signature:  Juline Patch, LCSW 08/04/2012 9:48 AM  Signature: Silverio Decamp, PMH-NP 08/04/2012 9:48 AM  Signature:  08/04/2012 9:48 AM  Signature:  08/04/2012 9:48 AM  Signature:    Signature:    Signature:      Scribe for Treatment Team:   Juline Patch,  08/04/2012 9:48 AM

## 2012-08-04 NOTE — Progress Notes (Signed)
D.  Pt. Has a flat affect, depressed mood.  Denies SI/HI and denies A/V hallucinations.  Contracts for safety. Minimal interaction. A.  Encouraged pt. To verbalize feeling and notify staff with any concerns. R.  Pt. Remains safe.

## 2012-08-04 NOTE — Progress Notes (Signed)
[facility] MD Progress Note  08/04/2012 1:45 PM [name]  MRN:  [id number] Subjective:  5/10 depression, complaining of dry cough--four pack a week smoker--bronchospasms--albuterol inhaler ordered, resides in a group home--57 yo son who he hopes to see this weekend, first time he smiled when he talked about his son. Diagnosis:   Axis I: Anxiety Disorder NOS and Major Depression, Recurrent severe Axis II: Deferred Axis III:  Past Medical History  Diagnosis Date  . Mental disorder   . Depression   . Neuromuscular disorder   . Multiple sclerosis, relapsing-remitting   . MS (multiple sclerosis)    Axis IV: other psychosocial or environmental problems, problems related to social environment and problems with primary support group Axis V: 41-50 serious symptoms  ADL's:  Intact  Sleep: Fair  Appetite:  Fair  Suicidal Ideation:  Denies Homicidal Ideation:  Denies  Psychiatric Specialty Exam: Review of Systems  Constitutional: Negative.   HENT: Negative.   Eyes: Negative.   Respiratory: Negative.   Cardiovascular: Negative.   Gastrointestinal: Negative.   Genitourinary: Negative.   Musculoskeletal:       Leg aches from MS, tingling in the arms at time  Skin: Negative.   Neurological: Negative.   Endo/Heme/Allergies: Negative.   Psychiatric/Behavioral: Positive for depression and substance abuse.    Blood pressure 83/61, pulse 90, temperature 97.5 F (36.4 C), temperature source Oral, resp. rate 18, height 5\' 11"  (1.803 m), weight 82.101 kg (181 lb).Body mass index is 25.26 kg/(m^2).  General Appearance: Casual  Eye Contact::  Fair  Speech:  Slow  Volume:  Decreased  Mood:  Depressed  Affect:  Flat  Thought Process:  Coherent  Orientation:  Full (Time, Place, and Person)  Thought Content:  WDL  Suicidal Thoughts:  No  Homicidal Thoughts:  No  Memory:  Immediate;   Fair Recent;   Fair Remote;   Fair  Judgement:  Fair  Insight:  Fair  Psychomotor Activity:  Decreased   Concentration:  Fair  Recall:  Fair  Akathisia:  No  Handed:  Left  AIMS (if indicated):     Assets:  Communication Skills Resilience  Sleep:  Number of Hours: 6.75   Current Medications: Current Facility-Administered Medications  Medication Dose Route Frequency Provider Last Rate Last Dose  . acetaminophen (TYLENOL) tablet 650 mg  650 mg Oral Q6H PRN Jorje Guild, PA-C      . alum & mag hydroxide-simeth (MAALOX/MYLANTA) 200-200-20 MG/5ML suspension 30 mL  30 mL Oral Q4H PRN Jorje Guild, PA-C      . ARIPiprazole (ABILIFY) tablet 2 mg  2 mg Oral Daily Jorje Guild, PA-C   2 mg at 08/04/12 0801  . benztropine (COGENTIN) tablet 0.5 mg  0.5 mg Oral BID Jorje Guild, PA-C   0.5 mg at 08/04/12 0801  . buPROPion (WELLBUTRIN XL) 24 hr tablet 150 mg  150 mg Oral Daily Jorje Guild, PA-C   150 mg at 08/04/12 0801  . Dimethyl Fumarate CPDR 240 mg  240 mg Oral BID Verne Spurr, PA-C   240 mg at 08/04/12 0801  . escitalopram (LEXAPRO) tablet 10 mg  10 mg Oral Daily Jorje Guild, PA-C   10 mg at 08/04/12 0801  . magnesium hydroxide (MILK OF MAGNESIA) suspension 30 mL  30 mL Oral Daily PRN Jorje Guild, PA-C        Lab Results: No results found for this or any previous visit (from the past 48 hour(s)).  Physical Findings: AIMS: Facial and Oral Movements Muscles  of Facial Expression: None, normal Lips and Perioral Area: None, normal Jaw: None, normal Tongue: None, normal,Extremity Movements Upper (arms, wrists, hands, fingers): None, normal Lower (legs, knees, ankles, toes): None, normal, Trunk Movements Neck, shoulders, hips: None, normal, Overall Severity Severity of abnormal movements (highest score from questions above): None, normal Incapacitation due to abnormal movements: None, normal Patient's awareness of abnormal movements (rate only patient's report): No Awareness, Dental Status Current problems with teeth and/or dentures?: No Does patient usually wear dentures?: No  CIWA:    COWS:     Treatment  Plan Summary: Daily contact with patient to assess and evaluate symptoms and progress in treatment Medication management  Plan:  Review of chart, vital signs, medications, and notes. 1-Individual and group therapy 2-Medication management for depression and anxiety:  Medications reviewed with the patient and he stated no untoward effects 3-Coping skills for depression 4-Continue crisis stabilization and management 5-Address health issues--monitoring vital signs, blood pressure low at times, fluids encouraged 6-Treatment plan in progress to prevent relapse of depression   Medical Decision Making Problem Points:  Established problem, stable/improving (1) and Review of psycho-social stressors (1) Data Points:  Review of medication regiment & side effects (2)  I certify that inpatient services furnished can reasonably be expected to improve the patient's condition.   [name], PMH-NP 08/04/2012, 1:45 PM

## 2012-08-04 NOTE — Progress Notes (Signed)
Adult Psychoeducational Group Note  Date:  08/04/2012 Time:  1:45 PM  Group Topic/Focus:  Wellness Toolbox:   The focus of this group is to discuss various aspects of wellness, balancing those aspects and exploring ways to increase the ability to experience wellness.  Patients will create a wellness toolbox for use upon discharge.  Participation Level:  None  Participation Quality:  Resistant  Affect:  Flat  Cognitive:  Appropriate  Insight: Appropriate  Engagement in Group:  Limited  Modes of Intervention:  Discussion  Additional Comments:  Pt remained in group but would not interact with the topic. Pt refused to write on worksheet.   Sharyn Lull 08/04/2012, 1:45 PM

## 2012-08-04 NOTE — Progress Notes (Signed)
Recreation Therapy Notes  Date: 04.14.2014  Time: 3:00pm Location: [facility]     Group Topic/Focus: Self Expression  Participation Level: Active  Participation Quality: Appropriate  Affect: Euthymic  Cognitive: Appropriate   Additional Comments: Activity: Who Am I worksheet & The Pieces of Me worksheet. Explanation: Patients were given a Who Am I worksheet. Worksheet had a circle divided into 6 equal parts. Patients were asked to identify the characteristics that are most prominent within them. Patients were then given The Pieces of Me worksheet. Worksheet has a circle divided into 10 equal parts. Patient were asked to define themselves using the following categories: Mind, Body, Spirituality, Alone, At Home, With Family, With Friends, At Work, At [employer]. Once part was left open for patients to define their own category.   Patient did not complete his worksheets. Patient stated to LRT "I don't know what to do with this." LRT explained the worksheets one at a time to patient, but patient still did not process the activity. Patient expressed hostile attitude towards his deceased father. Patient stated he would never forgive his father for what he has done because "he's a son of a bitch and I'm glad he's dead." LRT encouraged patient to let some of his anger towards his father go, patient refused stating "I won't do it." Patient showed poor insight to change or to let go of angry and resentment stemming from relationship with father.   Marykay Lex Brittnie Lewey, LRT/CTRS  Jearl Klinefelter 08/04/2012 4:23 PM

## 2012-08-05 NOTE — [facility] Group Notes (Signed)
[facility] LCSW Group Therapy      Feelings About Diagnosis 1:15 - 2:30 PM          08/05/2012 3:03 PM  Type of Therapy:  Group Therapy  Participation Level:  Active  Participation Quality:  Appropriate  Affect:  Appropriate  Cognitive:  Alert and Appropriate  Insight:  Engaged  Engagement in Therapy:  Engaged  Modes of Intervention:  Discussion, Education, Exploration, Problem-Solving, Rapport, Support  Summary of Progress/Problems:  Patient stated it was a relief learning both his diagnosis of depression and Multiple Scleroisis.   He shared he knows that is treatment for the things that are effecting him.  [name] 08/05/2012, 3:03 PM

## 2012-08-05 NOTE — Progress Notes (Signed)
Date: 08/05/2012  Time: 9:15 PM  Group Topic/Focus:  Wrap-Up Group: The focus of this group is to help patients review their daily goal of treatment and discuss progress on daily workbooks.  Participation Level: Active  Participation Quality: Appropriate, Sharing and Supportive  Affect: Appropriate  Cognitive: Appropriate  Insight: Appropriate  Engagement in Group: Engaged and Supportive  Modes of Intervention: Education, Problem-solving and Support  Additional Comments: none  Jayla Mackie M  08/05/2012, 9:15 PM  

## 2012-08-05 NOTE — Progress Notes (Signed)
[facility] MD Progress Note  08/05/2012 12:55 PM [name]  MRN:  [id number] Subjective:  Patient reports feeling depressed and intermittent suicidal thoughts. Tolerating his medications well, denies any side effects. Would like to obtain a list of all his medications.  Diagnosis:   Axis I: Major Depression, Recurrent severe Axis II: Deferred Axis III:  Past Medical History  Diagnosis Date  . Mental disorder   . Depression   . Neuromuscular disorder   . Multiple sclerosis, relapsing-remitting   . MS (multiple sclerosis)    Axis IV: other psychosocial or environmental problems Axis V: 41-50 serious symptoms  ADL's:  Intact  Sleep: Fair  Appetite:  Fair    Psychiatric Specialty Exam: Review of Systems  Constitutional: Negative.   HENT: Negative.   Eyes: Negative.   Respiratory: Negative.   Cardiovascular: Negative.   Gastrointestinal: Negative.   Genitourinary: Negative.   Musculoskeletal: Positive for joint pain.  Skin: Negative.   Neurological: Negative.   Endo/Heme/Allergies: Negative.   Psychiatric/Behavioral: Positive for depression and suicidal ideas. The patient is nervous/anxious.     Blood pressure 88/59, pulse 97, temperature 97.4 F (36.3 C), temperature source Oral, resp. rate 20, height 5\' 11"  (1.803 m), weight 82.101 kg (181 lb).Body mass index is 25.26 kg/(m^2).  General Appearance: Disheveled  Eye Solicitor::  Fair  Speech:  Garbled  Volume:  Decreased  Mood:  Depressed, Dysphoric and Hopeless  Affect:  Constricted, Depressed and Flat  Thought Process:  Circumstantial  Orientation:  Full (Time, Place, and Person)  Thought Content:  Rumination  Suicidal Thoughts:  Yes.  without intent/plan  Homicidal Thoughts:  No  Memory:  Immediate;   Fair Recent;   Fair Remote;   Fair  Judgement:  Fair  Insight:  Shallow  Psychomotor Activity:  Decreased  Concentration:  Fair  Recall:  Fair  Akathisia:  No  Handed:  Right  AIMS (if indicated):     Assets:   Communication Skills Desire for Improvement  Sleep:  Number of Hours: 6.75   Current Medications: Current Facility-Administered Medications  Medication Dose Route Frequency Provider Last Rate Last Dose  . acetaminophen (TYLENOL) tablet 650 mg  650 mg Oral Q6H PRN Jorje Guild, PA-C      . albuterol (PROVENTIL HFA;VENTOLIN HFA) 108 (90 BASE) MCG/ACT inhaler 2 puff  2 puff Inhalation QID [name], NP   2 puff at 08/05/12 1146  . alum & mag hydroxide-simeth (MAALOX/MYLANTA) 200-200-20 MG/5ML suspension 30 mL  30 mL Oral Q4H PRN Jorje Guild, PA-C      . ARIPiprazole (ABILIFY) tablet 2 mg  2 mg Oral Daily Jorje Guild, PA-C   2 mg at 08/05/12 0739  . benztropine (COGENTIN) tablet 0.5 mg  0.5 mg Oral BID Jorje Guild, PA-C   0.5 mg at 08/05/12 0740  . buPROPion (WELLBUTRIN XL) 24 hr tablet 150 mg  150 mg Oral Daily Jorje Guild, PA-C   150 mg at 08/05/12 0739  . Dimethyl Fumarate CPDR 240 mg  240 mg Oral BID Verne Spurr, PA-C   240 mg at 08/05/12 0740  . escitalopram (LEXAPRO) tablet 10 mg  10 mg Oral Daily Jorje Guild, PA-C   10 mg at 08/05/12 0739  . magnesium hydroxide (MILK OF MAGNESIA) suspension 30 mL  30 mL Oral Daily PRN Jorje Guild, PA-C        Lab Results: No results found for this or any previous visit (from the past 48 hour(s)).  Physical Findings: AIMS: Facial and Oral Movements  Muscles of Facial Expression: None, normal Lips and Perioral Area: None, normal Jaw: None, normal Tongue: None, normal,Extremity Movements Upper (arms, wrists, hands, fingers): None, normal Lower (legs, knees, ankles, toes): None, normal, Trunk Movements Neck, shoulders, hips: None, normal, Overall Severity Severity of abnormal movements (highest score from questions above): None, normal Incapacitation due to abnormal movements: None, normal Patient's awareness of abnormal movements (rate only patient's report): No Awareness, Dental Status Current problems with teeth and/or dentures?: No Does patient usually wear  dentures?: No  CIWA:    COWS:     Treatment Plan Summary: Daily contact with patient to assess and evaluate symptoms and progress in treatment Medication management  Plan: Continue current plan of care. Provide supportive counselling. Provide patient with a list of his medications.  Medical Decision Making Problem Points:  Established problem, stable/improving (1), Review of last therapy session (1) and Review of psycho-social stressors (1) Data Points:  Review of medication regiment & side effects (2)  I certify that inpatient services furnished can reasonably be expected to improve the patient's condition.   [name] 08/05/2012, 12:55 PM

## 2012-08-05 NOTE — Progress Notes (Addendum)
  D) Patientcooperative upon my assessment. Patient completed Patient Self Inventory, reports slept "well," and  appetite is "good." Patient rates depression as   7/10, patient rates hopeless feelings as  7/10. Patient endorses passive SI, contracts verbally for safety with RN. Patient denies HI, denies A/V hallucinations.   A) Patient offered support and encouragement, patient encouraged to discuss feelings/concerns with staff. Patient verbalized understanding. Patient monitored Q15 minutes for safety. Patient met with MD  to discuss today's goals and plan of care. Patient requests "some information about my meds," Patient given micromedex drug notes for medications, education provided by RN verbally. Patient encouraged to approach RN with any questions, patient verbalizes understanding.  R) Patient visible in milieu, attending groups in day room and meals in dining room. Patient appropriate with staff and peers.   Patient taking medications as ordered. Will continue to monitor.

## 2012-08-05 NOTE — Progress Notes (Signed)
Adult Psychoeducational Group Note  Date:  08/05/2012 Time:  6:17 PM  Group Topic/Focus:  Recovery Goals:   The focus of this group is to identify appropriate goals for recovery and establish a plan to achieve them.  Participation Level:  Active  Participation Quality:  Appropriate, Attentive and Sharing  Affect:  Appropriate  Cognitive:  Appropriate  Insight: Appropriate  Engagement in Group:  Engaged  Modes of Intervention:  Discussion  Additional Comments:  Pt was appropriate and sharing while attending group. Pt shared that he felt that he has not begin to recover. Staff encouraged to continue to try and improve his depression and the fact that he came to [facility] was a sign of recovery.  Sharyn Lull 08/05/2012, 6:17 PM

## 2012-08-05 NOTE — Progress Notes (Signed)
Patient ID: [name], male   DOB: Mar 05, 1956, 57 y.o.   MRN: [id number]  D: Patient lying in bed with eyes closed. Respirations even and non-labored.  A: Staff will monitor on q 15 minute checks, follow treatment plan, and give meds as ordered. R: Appears to be sleeping at this time.

## 2012-08-05 NOTE — [facility] Group Notes (Signed)
[facility] LCSW Aftercare Discharge Planning Group Note   08/05/2012 10:17 AM  Participation Quality:  Appropriate  Mood/Affect:  Appropriate  Depression Rating:  4  Anxiety Rating:  4  Thoughts of Suicide:  Yes  Will you contract for safety?   Yes  Current AVH:  No  Plan for Discharge/Comments:  Patient continues to endorse SI but contracts for safety.  He also states he does not believe he would act on thoughts if he were outside of the hospital.  Patient plans to return to [facility].  Transportation Means:   Facility provides transportation  Supports:  Limited support system.  Laureen Frederic, Joesph July

## 2012-08-05 NOTE — Progress Notes (Signed)
Grief and Loss Group  Patients participated on a group focused on significant losses in their lives. Patients discussed mourning these losses, the longevity of grief, and sources of coping and hope.  Pt participated in group and shared about the loss of his marriage due to his ex-wife leaving because of pt's depression.  Pt discussed the early loss of the presence of his father in his life due to his father leaving the family and the resulting motivation to remain as a presence in his son's life to make his son's experience better than his own.  Pt discussed struggling with drug overdoses and previous suicide attempts, but feeling torn to remain alive so that he continues to support his son.    Alvia Grove Counseling Intern [employer]

## 2012-08-05 NOTE — [facility] Counselor (Signed)
Adult Psychosocial Assessment Update Interdisciplinary Team  [facility] admissions[facility]discharges:  Admissions Discharges  Date:  02/17/12 Date:  02/26/12  Date: Date:  Date: Date:  Date: Date:  Date: Date:   Changes since the last Psychosocial Assessment (including adherence to outpatient mental health and/or substance abuse treatment, situational issues contributing to decompensation and/or relapse). Patient reports increased depression and having SI.  He advised he had called 911 and tried to block the phone number but they were able to trace his call.  Patient is unable to identify any specific stressor.             Discharge Plan 1. Will you be returning to the same living situation after discharge?   Yes: No:      If no, what is your plan?    Yes, patient plans to return to [facility].        2. Would you like a referral for services when you are discharged? Yes:     If yes, for what services?  No:       No, patient is followed for medications by [facility].       Summary and Recommendations (to be completed by the evaluator) [name] is a 57 years old Caucasian male admitted with Anxiety Disorder and Major Depression Disorder.  He will Patient will benefit from crisis stabilization, evaluation for medication management, psycho education groups for coping skills development, group therapy and assistance with discharge planning.                        Signature:  [name], 08/05/2012 10:15 AM

## 2012-08-06 NOTE — [facility] Group Notes (Signed)
[facility] LCSW Group Therapy      Emotional Regulation 1:15 - 2:30 PM          08/06/2012 4:15 PM  Type of Therapy:  Group Therapy  Participation Level:  Minimal  Participation Quality:  Appropriate  Affect:  Appropriate and Depressed  Cognitive:  Appropriate  Insight:  Developing/Improving and Engaged  Engagement in Therapy:  Developing/Improving and Engaged  Modes of Intervention:  Discussion, Education, Exploration, Problem-Solving, Rapport, Support  Summary of Progress/Problems:  Patient listened to discussion but did not participate.  [name] 08/06/2012, 4:15 PM

## 2012-08-06 NOTE — Progress Notes (Addendum)
D: Patient appropriate and cooperative with staff and peers. Patient's affect/mood is blunted and depressed. He reported on the self inventory sheet that his appetite is good, energy level is normal, and ability to pay attention is good. Patient rated depression and feelings of hopelessness "7". He's attending groups and interacting with peers in the milieu.  A: Support and encouragement provided to patient. Administered scheduled medications per ordering MD. Monitor Q15 minute checks for safety.  R: Patient receptive. Passive SI, but contracts for safety. Denies HI and AVH. Patient remains safe on the unit.

## 2012-08-06 NOTE — Progress Notes (Signed)
Recreation Therapy Notes  Date: 04.16.2014  Time: 3:00pm Location: [address] Day Room   Group Topic/Focus: Decision Making   Participation Level:  Active   Participation Quality:  Appropriate, Sharing   Affect:  Euthymic   Cognitive:  Appropriate   Additional Comments: Activity:Would you Rather & If; Explanation: Would you Rather - Patients were individually asked an either/or question (i.e.: Would you rather eat a blue M&M or a red M&M). Patient's were asked to explain why they chose the option they did. If - Patients were individually asked a "If you could... " question. Patient answered the questions and explained why they chose the answer they did.   Patient actively participated in group activities. When asked "Would you rather visit the doctor or the dentist?" Patient stated visit the dentist because of the potential for pain when leaving the dentist. When asked "If you could do your dream job 10 years from now, what would it be?" Patient stated this decision was based off of money, patient completed the math in group to calculate for what the President of the [place] makes over the course of a presidency. Participated in group discussion about decision making and what influences the decisions we make.   Marykay Lex Kaidence Sant, LRT/CTRS  Jearl Klinefelter 08/06/2012 4:32 PM

## 2012-08-06 NOTE — [facility] Group Notes (Signed)
[facility] LCSW Aftercare Discharge Planning Group Note   08/06/2012 4:12 PM  Participation Quality:  Appropriate  Mood/Affect:  Appropriate and Depressed  Depression Rating:  7  Anxiety Rating:  0  Thoughts of Suicide:  Yes  Will you contract for safety?   No  Current AVH:  No  Plan for Discharge/Comments:  Patient advised of not doing as well today as yesterday.  He shared he is not looking forward to returning to his family care home but does not Clinical research associate to explore other options.  Transportation Means: Home provides transportation  Supports:  Animator, [employer]

## 2012-08-06 NOTE — Progress Notes (Signed)
Adult Psychoeducational Group Note  Date:  08/06/2012 Time:  12:12 PM  Group Topic/Focus:  Stages of Change:   The focus of this group is to explain the stages of change and help patients identify changes they want to make upon discharge.  Participation Level:  Active  Participation Quality:  Attentive  Affect:  Blunted  Cognitive:  Alert and Oriented  Insight: Improving  Engagement in Group:  Engaged  Modes of Intervention:  Activity, Discussion, Education and Support  Additional Comments:  Pt said he is here because of depression. He participated in group and shared details about how to become well.  Samiel Peel T 08/06/2012, 12:12 PM
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
## 2012-08-07 NOTE — Progress Notes (Signed)
D: Patient appropriate and cooperative with staff and peers. Patient's affect/mood is depressed, but brighter today as opposed to previous shift. He reported on the self inventory sheet that he slept well, appetite/ability to pay attention is good and energy level is normal. Patient rated depression and feelings of hopelessness "7". Patient observed attending and participating in group activities; interacting with peers in the dayroom.  A: Support and encouragement provided to patient. Scheduled medications administered per MD orders. Maintain Q15 minute checks for safety.  R: Patient receptive. Denies SI/HI/AVH at this time. Patient remains safe.

## 2012-08-07 NOTE — Progress Notes (Signed)
Adult Psychoeducational Group Note  Date:  08/07/2012 Time:  6:08 PM  Group Topic/Focus:  Self Esteem Action Plan:   The focus of this group is to help patients create a plan to continue to build self-esteem after discharge.  Participation Level:  Minimal  Participation Quality:  Appropriate and Attentive  Affect:  Flat  Cognitive:  Oriented  Insight: Appropriate  Engagement in Group:  Lacking and Limited  Modes of Intervention:  Activity, Discussion, Education, Socialization and Support  Additional Comments:  Joh attended group and did not share during group. Patient defined self esteem as "how you feel about yourself."  When patient was asked to share what he wrote on self-esteem action plan worksheet, patient stated there was nothing he liked about himself or enjoyed doing. Patient was asked to think about things he enjoyed doing in the past, patient did not share. Karleen Hampshire Brittini 08/07/2012, 6:08 PM

## 2012-08-07 NOTE — Progress Notes (Signed)
Patient in day room earlier during this shift interacting with other patient. His mood and affects appropriate, he denied SI/HI and denied hallucinations. Q 15 minute check continues as ordered to maintain safety.

## 2012-08-07 NOTE — Progress Notes (Signed)
Adult Psychoeducational Group Note  Date:  08/07/2012 Time:  9:49 PM  Group Topic/Focus:  Wrap-Up Group:   Karaoke Group   Additional Comments:  Pt. Attended and participated in [place] group.   Ruta Hinds [facility] 08/07/2012, 9:49 PM

## 2012-08-07 NOTE — Progress Notes (Signed)
[facility] MD Progress Note  08/07/2012 2:07 PM [name]  MRN:  [id number] Subjective: [name] reports that his mood continue to be up and down. Patient stated "I felt really depressed inbetween groups but it is some better now. Rates his depression as a six today. Patient expressed concern about why he was not being discharged. When patient was reminded of his continued SI he stated "Oh I guess that explains it. I do have a history of attempting suicide. I guess I need to work on my mood." The patient is tolerating his medication regimen with no reported problems. He is observed interacting with peers and attending groups.  Diagnosis:   Axis I: Major Depression, Recurrent severe Axis II: Deferred Axis III:  Past Medical History  Diagnosis Date  . Mental disorder   . Depression   . Neuromuscular disorder   . Multiple sclerosis, relapsing-remitting   . MS (multiple sclerosis)    Axis IV: other psychosocial or environmental problems Axis V: 51-60 moderate symptoms  ADL's:  Intact  Sleep: Poor  Appetite:  Fair  Suicidal Ideation:  Plan:  Denies Intent:  Denies Means:  Safe in hospital setting Homicidal Ideation:  Denies AEB (as evidenced by):  Psychiatric Specialty Exam: Review of Systems  Constitutional: Negative.   HENT: Negative.   Eyes: Negative.   Respiratory: Negative.   Cardiovascular: Negative.   Gastrointestinal: Negative.   Genitourinary: Negative.   Musculoskeletal: Positive for myalgias and joint pain.  Skin: Negative.   Neurological: Negative.   Endo/Heme/Allergies: Negative.   Psychiatric/Behavioral: Positive for depression and suicidal ideas. Negative for hallucinations, memory loss and substance abuse. The patient is nervous/anxious and has insomnia.     Blood pressure 89/62, pulse 101, temperature 97.3 F (36.3 C), temperature source Oral, resp. rate 16, height 5\' 11"  (1.803 m), weight 82.101 kg (181 lb).Body mass index is 25.26 kg/(m^2).  General Appearance:  Casual and Disheveled  Eye Contact::  Fair  Speech:  Clear and Coherent  Volume:  Normal  Mood:  Depressed and Hopeless  Affect:  Flat  Thought Process:  Intact  Orientation:  Full (Time, Place, and Person)  Thought Content:  WDL  Suicidal Thoughts:  Yes.  without intent/plan  Homicidal Thoughts:  No  Memory:  Immediate;   Fair Recent;   Fair Remote;   Fair  Judgement:  Impaired  Insight:  Lacking  Psychomotor Activity:  Uses a cane due to muscle weakness  Concentration:  Fair  Recall:  Fair  Akathisia:  No  Handed:  Right  AIMS (if indicated):     Assets:  Communication Skills Desire for Improvement Resilience Social Support  Sleep:  Number of Hours: 4.75   Current Medications: Current Facility-Administered Medications  Medication Dose Route Frequency Provider Last Rate Last Dose  . acetaminophen (TYLENOL) tablet 650 mg  650 mg Oral Q6H PRN Jorje Guild, PA-C      . albuterol (PROVENTIL HFA;VENTOLIN HFA) 108 (90 BASE) MCG/ACT inhaler 2 puff  2 puff Inhalation QID [name], NP   2 puff at 08/07/12 1303  . alum & mag hydroxide-simeth (MAALOX/MYLANTA) 200-200-20 MG/5ML suspension 30 mL  30 mL Oral Q4H PRN Jorje Guild, PA-C      . ARIPiprazole (ABILIFY) tablet 2 mg  2 mg Oral Daily Jorje Guild, PA-C   2 mg at 08/07/12 0741  . benztropine (COGENTIN) tablet 0.5 mg  0.5 mg Oral BID Jorje Guild, PA-C   0.5 mg at 08/07/12 9562  . buPROPion (WELLBUTRIN XL) 24 hr  tablet 150 mg  150 mg Oral Daily Jorje Guild, PA-C   150 mg at 08/07/12 1610  . Dimethyl Fumarate CPDR 240 mg  240 mg Oral BID Verne Spurr, PA-C   240 mg at 08/05/12 0740  . escitalopram (LEXAPRO) tablet 10 mg  10 mg Oral Daily Jorje Guild, PA-C   10 mg at 08/07/12 9604  . magnesium hydroxide (MILK OF MAGNESIA) suspension 30 mL  30 mL Oral Daily PRN Jorje Guild, PA-C        Lab Results: No results found for this or any previous visit (from the past 48 hour(s)).  Physical Findings: AIMS: Facial and Oral Movements Muscles of Facial  Expression: None, normal Lips and Perioral Area: None, normal Jaw: None, normal Tongue: None, normal,Extremity Movements Upper (arms, wrists, hands, fingers): None, normal Lower (legs, knees, ankles, toes): None, normal, Trunk Movements Neck, shoulders, hips: None, normal, Overall Severity Severity of abnormal movements (highest score from questions above): None, normal Incapacitation due to abnormal movements: None, normal Patient's awareness of abnormal movements (rate only patient's report): No Awareness, Dental Status Current problems with teeth and/or dentures?: No Does patient usually wear dentures?: No  CIWA:    COWS:     Treatment Plan Summary: Daily contact with patient to assess and evaluate symptoms and progress in treatment Medication management  Plan: Continue crisis management and stabilization.  Medication management: Tolerating current regimen without an untoward effects.  Encouraged patient to attend groups and participate in group counseling sessions and activities. Patient encouraged to concentrate on improving use of his coping skills.  Address health issues: Vitals stable and reviewed. Patient able to ambulate around unit on his own.  Discharge plan in progress. Patient will follow up at [facility] and return to [facility] upon d/c.  Continue current treatment plan.   Medical Decision Making Problem Points:  Established problem, stable/improving (1) and Review of psycho-social stressors (1) Data Points:  Review of medication regiment & side effects (2)  I certify that inpatient services furnished can reasonably be expected to improve the patient's condition.   DAVIS, LAURA NP-C 08/07/2012, 2:07 PM

## 2012-08-07 NOTE — BHH Group Notes (Signed)
[facility] LCSW Aftercare Discharge Planning Group Note   08/07/2012 12:06 PM  Participation Quality:  Appropriate  Mood/Affect:  Depressed  Depression Rating:  7  Anxiety Rating:  0  Thoughts of Suicide:  Yes Will you contract for safety?   Yes  Current AVH:  No  Plan for Discharge/Comments:  Patient continues to endorse off/on SI but able to contract.  He provided contact info for group home staff.  Transportation Means:  [employer] provides transportation for residents  Supports:  Limited  [name], [employer]

## 2012-08-07 NOTE — Progress Notes (Signed)
Chaplain spoke with Pt in 500 hall day room prior to group.  Pt spoke with chaplain about history of MS, ETOH, care for his son (51).  Pt spoke with chaplain about art and comic books as relaxing and helpful ways for him to cope.  Pt also watches movies and plays puzzle video games.    Chaplain provided support around differences pt has noticed in ability since being diagnosed with MS.  Pt thankful that he has very few "episodes," but expresses living in some fear, as he does not know when his abilities will change.   Keynan connected with another pt in day room around art and sketching.    Will continue to provide support while pt is admitted.  Please page as needs arise.    Belva Crome MDiv

## 2012-08-08 MED ORDER — ARIPIPRAZOLE 2 MG PO TABS: 2.0000 mg | ORAL_TABLET | Freq: Every day | ORAL | Status: DC

## 2012-08-08 MED ORDER — ALBUTEROL SULFATE HFA 108 (90 BASE) MCG/ACT IN AERS: 2.0000 | INHALATION_SPRAY | Freq: Four times a day (QID) | RESPIRATORY_TRACT | Status: DC

## 2012-08-08 MED ORDER — BENZTROPINE MESYLATE 0.5 MG PO TABS: 0.5000 mg | ORAL_TABLET | Freq: Two times a day (BID) | ORAL | Status: DC

## 2012-08-08 MED ORDER — ESCITALOPRAM OXALATE 10 MG PO TABS: 10.0000 mg | ORAL_TABLET | Freq: Every morning | ORAL | Status: DC

## 2012-08-08 MED ORDER — DIMETHYL FUMARATE 120 MG PO CPDR: 1.0000 | DELAYED_RELEASE_CAPSULE | Freq: Two times a day (BID) | ORAL | Status: DC

## 2012-08-08 MED ORDER — BUPROPION HCL ER (XL) 150 MG PO TB24: 150.0000 mg | ORAL_TABLET | Freq: Every day | ORAL | Status: DC

## 2012-08-08 NOTE — Progress Notes (Signed)
D: Patient pleasant and cooperative with staff and peers. Patient's affect is appropriate to circumstance; mood is slightly depressed, but continues to be much brighter each day. He reported on the self inventory sheet that he slept well, appetite is good and energy level is normal. Patient rated depression and feelings of hopelessness "4".   A: Support and encouragement provided to patient. Administered scheduled medications per ordering MD. Monitor Q15 minute checks for safety.  R: Patient receptive. Denies SI/HI/AVH. Patient remains safe.
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
## 2012-08-08 NOTE — BHH Suicide Risk Assessment (Signed)
BHH INPATIENT:  Family/Significant Other Suicide Prevention Education  Suicide Prevention Education:  Patient Refusal for Family/Significant Other Suicide Prevention Education: The patient [name] has refused to provide written consent for family/significant other to be provided Family/Significant Other Suicide Prevention Education during admission and/or prior to discharge.  Physician notified.  Nail, Catalina Gravel 08/08/2012, 10:29 AM

## 2012-08-08 NOTE — Progress Notes (Signed)
[facility] Adult Case Management Discharge Plan :  Will you be returning to the same living situation after discharge: Yes,  [facility] At discharge, do you have transportation home?:Yes,  [facility] will be picking patient up Do you have the ability to pay for your medications:Yes,  no barriers  Release of information consent forms completed and in the chart;  Patient's signature needed at discharge.  Patient to Follow up at: Follow-up Information   Follow up with Dr. Nolberto Hanlon On 08/13/2012. (You are scheduled with Dr. August Saucer on Wednesday, August 13, 2012 at 8:30 AmM)    Contact information:   [address] [place], [place]   [address]  [phone]      Patient denies SI/HI:   Yes,  no reports, patient doing well    Safety Planning and Suicide Prevention discussed:  Yes,  completed with patient.  Nail, Catalina Gravel 08/08/2012, 12:24 PM

## 2012-08-08 NOTE — Progress Notes (Signed)
Discharge Note: Discharge instructions/prescriptions/medication samples given to patient. Patient verbalized understanding of discharge instructions and prescriptions. Returned belongings to patient. Denies SI/HI/AVH. Patient d/c without incident. 

## 2012-08-08 NOTE — BHH Group Notes (Signed)
[facility] LCSW Aftercare Discharge Planning Group Note   08/08/2012 9:40 AM  Participation Quality:  Active and Engaged, reports he has been here numerous times.  Mood/Affect:  Blunted and Flat  Depression Rating:  4  Anxiety Rating:  4  Thoughts of Suicide:  No Will you contract for safety?   Yes  Current AVH:  No  Plan for Discharge/Comments:  Patient reports he lives at [facility] and will return. He will follow up with [facility].  [facility] is also a good referral and discussed with patient as he has connected well with other members of the group as support.  Transportation Means: [employer] taxi, or medicaid transport when arranged.  Supports: Limited.  Nail, Catalina Gravel

## 2012-08-08 NOTE — Progress Notes (Signed)
D    Pt attends and participates in groups   He is pleasant and cooperative and compliant with treatment   He reports difficulty sleeping at times  But has not requested additional medication this night   A   Verbal support given  Medications administered and effectiveness monitored  Q 15 min checks R   Pt safe at present

## 2012-08-08 NOTE — [facility] Suicide Risk Assessment (Signed)
Suicide Risk Assessment  Discharge Assessment     Demographic Factors:  Male, Caucasian, Low socioeconomic status and Unemployed  Mental Status Per Nursing Assessment::   On Admission:  NA  Current Mental Status by Physician: Patient alert and oriented to 4. Denies AH/VH/SI/HI.  Loss Factors: Loss of significant relationship  Historical Factors: Impulsivity  Risk Reduction Factors:   Positive coping skills or problem solving skills  Continued Clinical Symptoms:  Depression:   Recent sense of peace/wellbeing  Cognitive Features That Contribute To Risk:  Cognitively intact    Suicide Risk:  Minimal: No identifiable suicidal ideation.  Patients presenting with no risk factors but with morbid ruminations; may be classified as minimal risk based on the severity of the depressive symptoms  Discharge Diagnoses:   AXIS I:  Major Depression, Recurrent severe AXIS II:  Deferred AXIS III:   Past Medical History  Diagnosis Date  . Mental disorder   . Depression   . Neuromuscular disorder   . Multiple sclerosis, relapsing-remitting   . MS (multiple sclerosis)    AXIS IV:  other psychosocial or environmental problems AXIS V:  61-70 mild symptoms  Plan Of Care/Follow-up recommendations:  Activity:  as tolerated Diet:  regular Follow up with outpatient appointments.  Is patient on multiple antipsychotic therapies at discharge:  No   Has Patient had three or more failed trials of antipsychotic monotherapy by history:  No  Recommended Plan for Multiple Antipsychotic Therapies: NA   Morgon Pamer 08/08/2012, 12:11 PM

## 2012-08-08 NOTE — Progress Notes (Signed)
Adult Psychoeducational Group Note  Date:  08/08/2012 Time:  2:13 PM  Group Topic/Focus:  Stages of Change:   The focus of this group is to explain the stages of change and help patients identify changes they want to make upon discharge.  Participation Level:  Active  Participation Quality:  Appropriate, Attentive, Sharing and Supportive  Affect:  Appropriate  Cognitive:  Alert and Appropriate  Insight: Appropriate and Good  Engagement in Group:  Engaged  Modes of Intervention:  Education  Additional Comments:  Pt was engaged in group, participating in discussion. Pt talked about making changes in the past and what he had to do in order to achieve those changes. Pts affect was bright and positive.    [name] 08/08/2012, 2:13 PM

## 2012-08-08 NOTE — Progress Notes (Signed)
Patient may be DC back to [facility] today.  FL2 is completed, awaiting call back from Group home regarding acceptance and transport. Spoke with NP, completed FL2 and will need MD signature.  Will follow up.  Andres Shad, MSW Clinical Lead [phone]

## 2012-08-08 NOTE — [facility] Group Notes (Signed)
[facility] LCSW Group Therapy  08/08/2012 3:35 PM  Type of Therapy:  Group Therapy  Participation Level:  None  Patient was being discharged during group. He returned to the group home.   Nail, [name] 08/08/2012, 3:35 PM

## 2012-08-13 NOTE — Progress Notes (Signed)
Patient Discharge Instructions:  After Visit Summary (AVS):   Faxed to:  08/13/12 Discharge Summary Note:   Faxed to:  08/13/12 Psychiatric Admission Assessment Note:   Faxed to:  08/13/12 Suicide Risk Assessment - Discharge Assessment:   Faxed to:  08/13/12 Faxed/Sent to the Next Level Care provider:  08/13/12 Faxed to [facility] @ [phone]  Jerelene Redden, 08/13/2012, 4:16 PM

## 2012-08-17 ENCOUNTER — Encounter (HOSPITAL_COMMUNITY): Payer: Self-pay

## 2012-08-17 ENCOUNTER — Inpatient Hospital Stay (HOSPITAL_COMMUNITY)
Admission: AD | Admit: 2012-08-17 | Discharge: 2012-08-21 | DRG: 885 | Disposition: A | Discharge status: Home / Self Care | Payer: Medicare Other | Attending: Psychiatry | Admitting: Psychiatry

## 2012-08-17 DIAGNOSIS — R45851 Suicidal ideations: Secondary | ICD-10-CM | POA: Diagnosis not present

## 2012-08-17 DIAGNOSIS — F411 Generalized anxiety disorder: Secondary | ICD-10-CM | POA: Diagnosis present

## 2012-08-17 DIAGNOSIS — F332 Major depressive disorder, recurrent severe without psychotic features: Principal | ICD-10-CM | POA: Diagnosis present

## 2012-08-17 DIAGNOSIS — Z79899 Other long term (current) drug therapy: Secondary | ICD-10-CM

## 2012-08-17 DIAGNOSIS — G35 Multiple sclerosis: Secondary | ICD-10-CM | POA: Diagnosis present

## 2012-08-17 DIAGNOSIS — J9601 Acute respiratory failure with hypoxia: Secondary | ICD-10-CM

## 2012-08-17 DIAGNOSIS — F341 Dysthymic disorder: Secondary | ICD-10-CM

## 2012-08-17 LAB — HEPATIC FUNCTION PANEL
ALT: 17 U/L (ref 0–53)
AST: 14 U/L (ref 0–37)
Albumin: 3.6 g/dL (ref 3.5–5.2)
Alkaline Phosphatase: 53 U/L (ref 39–117)
Bilirubin, Direct: <0.1 mg/dL (ref 0.0–0.3)
Total Bilirubin: 0.3 mg/dL (ref 0.3–1.2)
Total Protein: 6.4 g/dL (ref 6.0–8.3)

## 2012-08-17 LAB — COMPREHENSIVE METABOLIC PANEL
ALT: 15 U/L (ref 0–53)
AST: 14 U/L (ref 0–37)
Albumin: 3.5 g/dL (ref 3.5–5.2)
Alkaline Phosphatase: 53 U/L (ref 39–117)
BUN: 17 mg/dL (ref 6–23)
CO2: 26 mEq/L (ref 19–32)
Calcium: 9.1 mg/dL (ref 8.4–10.5)
Chloride: 107 mEq/L (ref 96–112)
Creatinine, Ser: 1.02 mg/dL (ref 0.50–1.35)
GFR calc Af Amer: >90 mL/min (ref >90)
GFR calc non Af Amer: 80 mL/min — ABNORMAL LOW (ref >90)
Glucose, Bld: 139 mg/dL — ABNORMAL HIGH (ref 70–99)
Potassium: 3.7 mEq/L (ref 3.5–5.1)
Sodium: 140 mEq/L (ref 135–145)
Total Bilirubin: 0.3 mg/dL (ref 0.3–1.2)
Total Protein: 6.5 g/dL (ref 6.0–8.3)

## 2012-08-17 LAB — CBC
HCT: 39.3 % (ref 39.0–52.0)
Hemoglobin: 13.3 g/dL (ref 13.0–17.0)
MCH: 30 pg (ref 26.0–34.0)
MCHC: 33.8 g/dL (ref 30.0–36.0)
MCV: 88.5 fL (ref 78.0–100.0)
Platelets: 191 10*3/uL (ref 150–400)
RBC: 4.44 MIL/uL (ref 4.22–5.81)
RDW: 14.9 % (ref 11.5–15.5)
WBC: 6.1 10*3/uL (ref 4.0–10.5)

## 2012-08-17 MED ORDER — ALBUTEROL SULFATE HFA 108 (90 BASE) MCG/ACT IN AERS
2.0000 | INHALATION_SPRAY | Freq: Four times a day (QID) | RESPIRATORY_TRACT | Status: DC
  Filled 2012-08-17 (×3): qty 6.7

## 2012-08-17 MED ORDER — ACETAMINOPHEN 325 MG PO TABS: 650.0000 mg | ORAL_TABLET | Freq: Four times a day (QID) | ORAL | Status: DC | PRN

## 2012-08-17 MED ORDER — BENZTROPINE MESYLATE 0.5 MG PO TABS
0.5000 mg | ORAL_TABLET | Freq: Two times a day (BID) | ORAL | Status: DC
  Administered 2012-08-18 – 2012-08-20 (×5): 0.5 mg via ORAL
  Filled 2012-08-17 (×8): qty 1

## 2012-08-17 MED ORDER — ESCITALOPRAM OXALATE 10 MG PO TABS
10.0000 mg | ORAL_TABLET | Freq: Every day | ORAL | Status: DC
  Administered 2012-08-18 – 2012-08-21 (×4): 10 mg via ORAL
  Filled 2012-08-17 (×7): qty 1

## 2012-08-17 MED ORDER — ARIPIPRAZOLE 2 MG PO TABS
2.0000 mg | ORAL_TABLET | Freq: Every day | ORAL | Status: DC
  Administered 2012-08-18 – 2012-08-19 (×2): 2 mg via ORAL
  Filled 2012-08-17 (×4): qty 1

## 2012-08-17 MED ORDER — BUPROPION HCL ER (XL) 150 MG PO TB24
150.0000 mg | ORAL_TABLET | Freq: Every day | ORAL | Status: DC
  Administered 2012-08-18 – 2012-08-21 (×4): 150 mg via ORAL
  Filled 2012-08-17 (×7): qty 1

## 2012-08-17 MED ORDER — DIMETHYL FUMARATE 120 MG PO CPDR
120.0000 mg | DELAYED_RELEASE_CAPSULE | Freq: Two times a day (BID) | ORAL | Status: DC
  Filled 2012-08-17 (×4): qty 1

## 2012-08-17 MED ORDER — ALUM & MAG HYDROXIDE-SIMETH 200-200-20 MG/5ML PO SUSP: 30.0000 mL | ORAL | Status: DC | PRN

## 2012-08-17 MED ORDER — MAGNESIUM HYDROXIDE 400 MG/5ML PO SUSP: 30.0000 mL | Freq: Every day | ORAL | Status: DC | PRN

## 2012-08-17 NOTE — Progress Notes (Signed)
Patient ID: [name], male   DOB: 1955-07-14, 57 y.o.   MRN: [id number] D)  Was up and about this evening, attended group, got a snack and went to his room.  Refused his inhaler this evening, stated doesn't use one at home , but began talking about his home meds and said it was one of his biggest problems.  He said the meds he is currently on, are unaffordable.  He said the abilify is over $800/month, and that his MS meds are also very expensive, is on an assistance program, but is struggling.  Feeling much frustration and hoping to have his meds changed to something he can afford to stay on.   Admitted to feeling like giving up, hoping can get his meds straightened out. A)  Will continue to monitor for safety, support and encouragement, encourage group attendance, cont. POC R)  Safety maintained.   Addendum:  Roommate came out to nsg station to request this pt be moved.  Stated [name] was making too much noise in his sleep for roommate to sleep.  Went to the room and [name] was moaning softly.  Was awakened when he heard roommate talking.  Was apologetic but didn't want to move, and neither did the roommate.  Will continue to monitor situation.

## 2012-08-17 NOTE — Tx Team (Signed)
Initial Interdisciplinary Treatment Plan  PATIENT STRENGTHS: (choose at least two) Ability for insight General fund of knowledge  PATIENT STRESSORS: Health problems   PROBLEM LIST: Problem List/Patient Goals Date to be addressed Date deferred Reason deferred Estimated date of resolution  Suicide 08/17/12     depression 08/17/12     Risk for suicide 08/17/12                                          DISCHARGE CRITERIA:  Improved stabilization in mood, thinking, and/or behavior Medical problems require only outpatient monitoring  PRELIMINARY DISCHARGE PLAN: Attend aftercare/continuing care group Outpatient therapy  PATIENT/FAMIILY INVOLVEMENT: This treatment plan has been presented to and reviewed with the patient, [name].  The patient and family have been given the opportunity to ask questions and make suggestions.  [name] 08/17/2012, 6:43 PM

## 2012-08-17 NOTE — Progress Notes (Signed)
Patient ID: [name], male   DOB: 1955-12-20, 57 y.o.   MRN: [id number]  Admission Note:  D:57 yr male who presents VC in no acute distress for the treatment of SI and Depression. Pt appears flat and depressed, but seems in good spirits. Pt was calm and cooperative with admission process. Pt presents with passive SI and contracts for safety upon admission. Pt denies AVH .Pt has Past medical Hx of Depression, MS, neurological damage. Pt was just here last week. Pt plan was to take OTC meds to end his life. Pt walks with a cane.   A: Skin was assessed and found to be clear of any abnormal marks . POC and unit policies explained and understanding verbalized. Consents obtained. Food and fluids offered, and  accepted.   R:Pt had no additional questions or concerns.
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
## 2012-08-18 ENCOUNTER — Encounter (HOSPITAL_COMMUNITY): Payer: Self-pay | Admitting: Psychiatry

## 2012-08-18 LAB — URINALYSIS, ROUTINE W REFLEX MICROSCOPIC
Bilirubin Urine: NEGATIVE
Glucose, UA: NEGATIVE mg/dL
Hgb urine dipstick: NEGATIVE
Ketones, ur: NEGATIVE mg/dL
Leukocytes, UA: NEGATIVE
Nitrite: NEGATIVE
Protein, ur: NEGATIVE mg/dL
Specific Gravity, Urine: 1.023 (ref 1.005–1.030)
Urobilinogen, UA: 1 mg/dL (ref 0.0–1.0)
pH: 6 (ref 5.0–8.0)

## 2012-08-18 LAB — RAPID URINE DRUG SCREEN, HOSP PERFORMED
Amphetamines: NOT DETECTED
Barbiturates: NOT DETECTED
Benzodiazepines: NOT DETECTED
Cocaine: NOT DETECTED
Opiates: NOT DETECTED
Tetrahydrocannabinol: NOT DETECTED

## 2012-08-18 LAB — TSH: TSH: 1.867 u[IU]/mL (ref 0.350–4.500)

## 2012-08-18 MED ORDER — DIMETHYL FUMARATE 240 MG PO CPDR
240.0000 mg | DELAYED_RELEASE_CAPSULE | Freq: Two times a day (BID) | ORAL | Status: DC
  Administered 2012-08-18 – 2012-08-21 (×6): 240 mg via ORAL

## 2012-08-18 NOTE — Progress Notes (Addendum)
D:  Patient's self inventory sheet, patient sleeps well, has good appetite, normal energy level, good attention span.  Rated depression and hopelessness #7.  Denied withdrawals.  SI, contracts for safety.  "Legs are sore when I walk." A:  Medications administered per MD orders.  Support and encouragement given throughout day. R:  Following treatment plan.  Denied HI.  Denied A/V hallucinations.  SI, contracts for safety. Patient ambulates using walker in hallway.

## 2012-08-18 NOTE — Progress Notes (Signed)
Adult Psychoeducational Group Note  Date:  08/18/2012 Time:  9:43 PM  Group Topic/Focus:  Rediscovering Joy:   The focus of this group is to explore various ways to relieve stress in a positive manner.  Participation Level:  Active  Participation Quality:  Appropriate  Affect:  Appropriate  Cognitive:  Appropriate  Insight: Appropriate  Engagement in Group:  Engaged and Supportive  Modes of Intervention:  Problem-solving and Support  Additional Comments:  Nyxon was able to provide positive feedback with others and discovered within himself that he was having a hard time letting go of his past.    Adelina Mings 08/18/2012, 9:43 PM

## 2012-08-18 NOTE — BHH Group Notes (Signed)
[facility] LCSW Group Therapy        Overcoming Obstacles 1:15 2:30 PM         08/18/2012 3:33 PM  Type of Therapy:  Group Therapy  Participation Level:  Minimal  Participation Quality:  Appropriate  Affect:  Appropriate, Depressed and Flat  Cognitive:  Appropriate  Insight:  Developing/Improving  Engagement in Therapy:  Developing/Improving  Modes of Intervention:  Discussion, Education, Exploration, Problem-Solving, Rapport Building, Support  Summary of Progress/Problems:  Patient listened attentively but did not participate stating he was unable to talk at the moment.  [name] 08/18/2012, 3:33 PM
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
## 2012-08-18 NOTE — H&P (Signed)
Psychiatric Admission Assessment Adult  Patient Identification:  [name] Date of Evaluation:  08/18/2012 Chief Complaint:  MDD History of Present Illness: [name] is a 57 y.o. divorced white male. Pt was recently admitted to [facility], and was discharged about 1 week ago. He reports that he has had SI during this entire time. He endorses depressed mood with symptoms noted in the "risk to self" assessment below. He notes that his mood is okay in the morning, but he gradually becomes more depressed as the day progresses. He denies any recent stressors, but he is afflicted with multiple sclerosis, is on disability, and lives in a family care home. During his recent admission to [facility] he was started on Abilify and he wonders if his mood problems could be an averse reaction to this medication. Pt knows what he feels like when he is about to make a suicide attempt, and he therefore decided to seek help today before the problem got out of control.  Pt reports that even when he was discharged from [facility] he continued to experience SI. He had been inpatient for about a week and felt like it was time to go, so he denied having SI, even though he was, indeed, having suicidal thoughts. He regrets having decided to do this, and says that he had done the same thing years ago during an admission to [facility], with equally bad results. He reports having an elaborate plan to walk to the local [facility], buy a large quantity of OTC sleep aids along with alcohol, then rent a room at a local motel and overdose. He has been interrupted by the very supportive owner of his family care home while trying to execute this very same plan in the recent past. He has a history of too many attempts to count, mostly by overdose, and at times has been rescued only because he called a friend or 911 to have company on the telephone while he passed away, only to have help come before he died. Pt does not want to risk experiencing pain during an attempt:  "I want to just go to sleep, never wake up." His most recent attempt was a few months ago.  Today patient has a hard time describing what led to his readmission. Patient does mention not being honest about his suicidal thoughts prior to his last discharge. He expresses frustration with his living situation stating "Some of the other residents come over to where I live an bother me." Patient is concerned about the possibility that some of his medications are causing erectile dysfunction. He reports being on an ED medicine years ago with positive effect. Patient is open to going to have a physical with a Medical Doctor after his discharge stating "I guess I should have it checked out. And I'm long overdue for a checkup." The patient mentions "having no life." He is only able to name his 76 year old son as a support. [name] mentions that on most days "I wake up feeling fine. But as the day goes on I get so depressed. I'm afraid I will end up following through on my plan to overdose." The patient is also concerned about the cost of his medications.     Elements:  Location:  [facility] in-patient. Quality:  Depression with SI. Severity:  Severe. Timing:  Present since d/c from [facility] week and a half ago. Duration:  Problems with depression and SI since 1992. . Context:  Medical problems and feeling like he  has nothing to live for. Associated Signs/Synptoms: Depression Symptoms:  depressed mood, anhedonia, fatigue, feelings of worthlessness/guilt, difficulty concentrating, hopelessness, impaired memory, recurrent thoughts of death, suicidal thoughts with specific plan, anxiety, decreased labido, (Hypo) Manic Symptoms:  Irritable Mood, Labiality of Mood, Anxiety Symptoms:  Excessive Worry, Psychotic Symptoms:  Denies PTSD Symptoms: Denies  Psychiatric Specialty Exam: Physical Exam  Constitutional: He is oriented to person, place, and time. He appears well-developed and well-nourished.  HENT:   Head: Normocephalic and atraumatic.  Right Ear: External ear normal.  Left Ear: External ear normal.  Nose: Nose normal.  Mouth/Throat: Oropharynx is clear and moist.  Eyes: Conjunctivae and EOM are normal. Pupils are equal, round, and reactive to light.  Neck: Normal range of motion. Neck supple.  Cardiovascular: Normal rate, regular rhythm, normal heart sounds and intact distal pulses.   Respiratory: Effort normal and breath sounds normal.  GI: Bowel sounds are normal.  Musculoskeletal:  Patient has generalized weakness in both legs due to his diagnosis of MS.   Neurological: He is alert and oriented to person, place, and time.  Skin: Skin is warm and dry.    Review of Systems  Constitutional: Negative.   HENT: Negative.   Eyes: Negative.   Respiratory: Negative.   Cardiovascular: Negative.   Gastrointestinal: Negative.   Genitourinary: Negative.   Musculoskeletal: Positive for myalgias (Reports having chronic muscle aches in his legs from MS).  Skin: Negative.   Neurological: Negative.   Endo/Heme/Allergies: Negative.   Psychiatric/Behavioral: Positive for depression. Negative for suicidal ideas, hallucinations, memory loss and substance abuse. The patient has insomnia. The patient is not nervous/anxious.     Blood pressure 92/64, pulse 88, temperature 97.5 F (36.4 C), temperature source Oral, resp. rate 16, height 5' 10.5" (1.791 m), weight 84.369 kg (186 lb).Body mass index is 26.3 kg/(m^2).  General Appearance: Casual and Disheveled  Eye Contact::  Fair  Speech:  Clear and Coherent  Volume:  Normal  Mood:  Depressed and Irritable  Affect:  Constricted  Thought Process:  Circumstantial  Orientation:  Full (Time, Place, and Person)  Thought Content:  Rumination  Suicidal Thoughts:  No  Homicidal Thoughts:  No  Memory:  Immediate;   Fair Recent;   Fair Remote;   Fair  Judgement:  Impaired  Insight:  Shallow  Psychomotor Activity:  Uses cane for ambulation   Concentration:  Fair  Recall:  Fair  Akathisia:  No  Handed:  Right  AIMS (if indicated):     Assets:  Communication Skills Desire for Improvement Financial Resources/Insurance Housing Leisure Time Resilience Social Support  Sleep:  Number of Hours: 6.5    Past Psychiatric History:Long history Diagnosis:Depression  Hospitalizations:[name] 2013, multiple at [facility]  Outpatient Care:[facility]  Substance Abuse Care:None  Self-Mutilation:None  Suicidal Attempts: History of serious overdoses  Violent Behaviors:None   Past Medical History:   Past Medical History  Diagnosis Date  . Mental disorder   . Depression   . Neuromuscular disorder   . Multiple sclerosis, relapsing-remitting   . MS (multiple sclerosis)    None. Allergies:  No Known Allergies PTA Medications: Prescriptions prior to admission  Medication Sig Dispense Refill  . albuterol (PROVENTIL HFA;VENTOLIN HFA) 108 (90 BASE) MCG/ACT inhaler Inhale 2 puffs into the lungs 4 (four) times daily. For SOB.  1 Inhaler  0  . ARIPiprazole (ABILIFY) 2 MG tablet Take 1 tablet (2 mg total) by mouth daily. For mood control  30 tablet  0  . benztropine (COGENTIN) 0.5  MG tablet Take 1 tablet (0.5 mg total) by mouth 2 (two) times daily.  60 tablet  0  . buPROPion (WELLBUTRIN XL) 150 MG 24 hr tablet Take 1 tablet (150 mg total) by mouth daily.  30 tablet  0  . Dimethyl Fumarate (TECFIDERA) 120 MG CPDR Take 1 tablet by mouth 2 (two) times daily. For management of MS  14 capsule    . escitalopram (LEXAPRO) 10 MG tablet Take 1 tablet (10 mg total) by mouth every morning. For Depression/Anxiety  30 tablet  0    Previous Psychotropic Medications:  Medication/Dose- Patient is a very poor historian regarding his past  History of psychiatric medication usage.                Substance Abuse History in the last 12 months:  no  Consequences of Substance Abuse: NA  Social History:  reports that he has been smoking Cigarettes.   He has a 18 pack-year smoking history. He does not have any smokeless tobacco history on file. He reports that  drinks alcohol. He reports that he does not use illicit drugs. 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 Legal History: Hobbies/Interests:  Family History:  History reviewed. No pertinent family history.  Results for orders placed during the hospital encounter of 08/17/12 (from the past 72 hour(s))  CBC     Status: None   Collection Time    08/17/12  7:53 PM      Result Value Range   WBC 6.1  4.0 - 10.5 K/uL   RBC 4.44  4.22 - 5.81 MIL/uL   Hemoglobin 13.3  13.0 - 17.0 g/dL   HCT 95.6  21.3 - 08.6 %   MCV 88.5  78.0 - 100.0 fL   MCH 30.0  26.0 - 34.0 pg   MCHC 33.8  30.0 - 36.0 g/dL   RDW 57.8  46.9 - 62.9 %   Platelets 191  150 - 400 K/uL  COMPREHENSIVE METABOLIC PANEL     Status: Abnormal   Collection Time    08/17/12  7:53 PM      Result Value Range   Sodium 140  135 - 145 mEq/L   Potassium 3.7  3.5 - 5.1 mEq/L   Chloride 107  96 - 112 mEq/L   CO2 26  19 - 32 mEq/L   Glucose, Bld 139 (*) 70 - 99 mg/dL   BUN 17  6 - 23 mg/dL   Creatinine, Ser 5.28  0.50 - 1.35 mg/dL   Calcium 9.1  8.4 - 41.3 mg/dL   Total Protein 6.5  6.0 - 8.3 g/dL   Albumin 3.5  3.5 - 5.2 g/dL   AST 14  0 - 37 U/L   ALT 15  0 - 53 U/L   Alkaline Phosphatase 53  39 - 117 U/L    Total Bilirubin 0.3  0.3 - 1.2  mg/dL   GFR calc non Af Amer 80 (*) >90 mL/min   GFR calc Af Amer >90  >90 mL/min   Comment:            The eGFR has been calculated     using the CKD EPI equation.     This calculation has not been     validated in all clinical     situations.     eGFR's persistently     <90 mL/min signify     possible Chronic Kidney Disease.  HEPATIC FUNCTION PANEL     Status: None   Collection Time    08/17/12  7:53 PM      Result Value Range   Total Protein 6.4  6.0 - 8.3 g/dL   Albumin 3.6  3.5 - 5.2 g/dL   AST 14  0 - 37 U/L   ALT 17  0 - 53 U/L   Alkaline Phosphatase 53  39 - 117 U/L   Total Bilirubin 0.3  0.3 - 1.2 mg/dL   Bilirubin, Direct <1.6  0.0 - 0.3 mg/dL   Indirect Bilirubin NOT CALCULATED  0.3 - 0.9 mg/dL  TSH     Status: None   Collection Time    08/17/12  7:53 PM      Result Value Range   TSH 1.867  0.350 - 4.500 uIU/mL   Psychological Evaluations:  Assessment:   AXIS I:  Major Depression, Recurrent severe AXIS II:  Deferred AXIS III:   Past Medical History  Diagnosis Date  . Mental disorder   . Depression   . Neuromuscular disorder   . Multiple sclerosis, relapsing-remitting   . MS (multiple sclerosis)    AXIS IV:  other psychosocial or environmental problems, problems related to social environment and problems with primary support group AXIS V:  41-50 serious symptoms   Treatment Plan/Recommendations:   1. Admit for crisis management and stabilization. Estimated length of stay 5-7 days. 2. Medication management to reduce current symptoms to base line and improve the patient's level of functioning. Patient's prior medications of Abilify, Cogentin, Wellbutrin XL, and Lexapro continued.  3. Develop treatment plan to decrease risk of relapse upon discharge of depressive symptoms and the need for readmission. 5. Group therapy to facilitate development of healthy coping skills to use for depression and anxiety. 6. Health care follow up  as needed for medical problems.  7. Discharge plan to include continuation of follow up care at [facility].  8. Call for Consult with Hospitalist for additional specialty patient services as needed.   Treatment Plan Summary: Daily contact with patient to assess and evaluate symptoms and progress in treatment Medication management Current Medications:  Current Facility-Administered Medications  Medication Dose Route Frequency Provider Last Rate Last Dose  . acetaminophen (TYLENOL) tablet 650 mg  650 mg Oral Q6H PRN Shuvon Rankin, NP      . albuterol (PROVENTIL HFA;VENTOLIN HFA) 108 (90 BASE) MCG/ACT inhaler 2 puff  2 puff Inhalation QID Shuvon Rankin, NP      . alum & mag hydroxide-simeth (MAALOX/MYLANTA) 200-200-20 MG/5ML suspension 30 mL  30 mL Oral Q4H PRN Shuvon Rankin, NP      . ARIPiprazole (ABILIFY) tablet 2 mg  2 mg Oral Daily Shuvon Rankin, NP   2 mg at 08/18/12 0807  . benztropine (COGENTIN) tablet 0.5 mg  0.5 mg Oral BID Shuvon Rankin, NP   0.5 mg at 08/18/12 1096  . buPROPion (WELLBUTRIN XL) 24 hr tablet 150 mg  150 mg Oral Daily Shuvon Rankin, NP   150 mg at 08/18/12 0806  . Dimethyl Fumarate CPDR 120 mg  120 mg Oral BID Maikel Neisler, MD      . escitalopram (LEXAPRO) tablet 10 mg  10 mg Oral Daily Shuvon Rankin, NP   10 mg at 08/18/12 0806  . magnesium hydroxide (MILK OF MAGNESIA) suspension 30 mL  30 mL Oral Daily PRN Shuvon Rankin, NP        Observation Level/Precautions:  15 minute checks  Laboratory:  CBC Chemistry Profile TSH  Psychotherapy: Individual and Group Therapy  Medications:  Continue home medications  Consultations:  As needed  Discharge Concerns:  Safety and Stabilization  Estimated LOS: 5-7 days  Other:     I certify that inpatient services furnished can reasonably be expected to improve the patient's condition.   Fransisca Kaufmann NP-C 4/28/201411:56 AM

## 2012-08-18 NOTE — Progress Notes (Signed)
[facility] Group Notes:  (Nursing/MHT/Case Management/Adjunct)  Date:  08/17/2012  Time:  2000  Type of Therapy:  Psychoeducational Skills  Participation Level:  Active  Participation Quality:  Attentive  Affect:  Appropriate  Cognitive:  Appropriate  Insight:  Improving  Engagement in Group:  Engaged  Modes of Intervention:  Education  Summary of Progress/Problems: The patient brought up in group that this was his second admission to [facility]. for the month of April. He brought up the fact that he felt homicidal towards someone at his group home. Finally, he shared that he hopes to learn something while being here in the hospital and that by his own admission, he is stubborn. His goal for tomorrow is to simply wake up.   Hazle Coca S 08/18/2012, 12:54 AM

## 2012-08-18 NOTE — B[facility] Counselor (Signed)
Adult Psychosocial Assessment Update Interdisciplinary Team  Previous [facility] admissions/discharges:  Admissions Discharges  Date:  08/02/12 Date:  08/09/10  Date: Date:  Date: Date:  Date: Date:  Date: Date:   Changes since the last Psychosocial Assessment (including adherence to outpatient mental health and/or substance abuse treatment, situational issues contributing to decompensation and/or relapse). Patient reports increased depression and SI since discharging from hospital on 08/08/12.  He shared he needs to get his medication straight.             Discharge Plan 1. Will you be returning to the same living situation after discharge?   Yes: No:      If no, what is your plan?    Patient plans to return to [facility] at discharge.       2. Would you like a referral for services when you are discharged? Yes:     If yes, for what services?  No:       Patient is followed by [facility] for outpatient services.       Summary and Recommendations (to be completed by the evaluator) [name] is a 57 year old Caucasian male admitted with Major Depression Disorder.  He will Patient will benefit from crisis stabilization, evaluation for medication management, psycho education groups for coping skills development, group therapy and assistance with discharge planning.                        Signature:  [name], 08/18/2012 3:38 PM

## 2012-08-18 NOTE — Tx Team (Signed)
Interdisciplinary Treatment Plan Update   Date Reviewed:  08/18/2012  Time Reviewed:  9:47 AM  Progress in Treatment:   Attending groups: Yes Participating in groups: Yes Taking medication as prescribed: Yes  Tolerating medication: Yes Family/Significant other contact made: No, but will ask for consent for collateral contact Patient understands diagnosis: Yes  Discussing patient identified problems/goals with staff: Yes Medical problems stabilized or resolved: Yes Denies suicidal/homicidal ideation: Yes Patient has not harmed self or others: Yes  For review of initial/current patient goals, please see plan of care.  Estimated Length of Stay:  2-3 days  Reasons for Continued Hospitalization:  Anxiety Depression Medication stabilization  New Problems/Goals identified:    Discharge Plan or Barriers:   Home with outpatient follow up [facility]  Additional Comments:  [name] is a 57 y.o. divorced white male. Pt was recently admitted to [facility], and was discharged about 1 week ago. He reports that he has had SI during this entire time. He endorses depressed mood with symptoms noted in the "risk to self" assessment below. He notes that his mood is okay in the morning, but he gradually becomes more depressed as the day progresses.  Paitent is rating depression at seven/eight and hopelessness/ Helplessness at nine/ten   Attendees:  Patient:  08/18/2012 9:47 AM   Signature: Patrick North, MD 08/18/2012 9:47 AM  Signature: 08/18/2012 9:47 AM  Signature: 08/18/2012 9:47 AM  Signature:Beverly Terrilee Croak, RN 08/18/2012 9:47 AM  Signature:  Neill Loft RN 08/18/2012 9:47 AM  Signature:  Juline Patch, LCSW 08/18/2012 9:47 AM  Signature: 08/18/2012 9:47 AM  Signature: Liliane Bade, BSW 08/18/2012 9:47 AM  Signature: Fransisca Kaufmann, [facility] 08/18/2012 9:47 AM  Signature:    Signature:    Signature:      Scribe for Treatment Team:   Juline Patch,  08/18/2012 9:47 AM

## 2012-08-18 NOTE — Progress Notes (Signed)
D:Patient in the hallway using his cane on approach.  Patient states he had a all right day.  Patient states he does not know why he is prescribed an inhaler because he has never used them and states he does not need it. Patient states his MS bothers him and he has to take it one day at a time because it affects his walking   Patient states he is having suicidal ideations on and off.  Patient denies HI and denies AVH.   A: Staff to monitor Q 15 mins for safety.  Encouragement and support offered.  No scheduled medications administered tonight.  Patient refused his inhaler.  Patient states he does not use and inhaler and states he does not know why it is ordered for him. R: Patient remains safe on the unit.  Patient attended group tonight.  Patient had no administered medications tonight.  Patient using cane. Patient visible on the unit

## 2012-08-19 DIAGNOSIS — F411 Generalized anxiety disorder: Secondary | ICD-10-CM

## 2012-08-19 DIAGNOSIS — F332 Major depressive disorder, recurrent severe without psychotic features: Principal | ICD-10-CM

## 2012-08-19 NOTE — Progress Notes (Signed)
D:  Patient's self inventory form, patient sleeps well, has good appetite, normal energy level, good attention span.  Rated depression and hopelessness #8.  Denied withdrawals.  SI, contracts for safety.  Has experienced leg pain in past 24 hours.  No questions for staff.  Not sure about discharge plans or medications. A:  Medications administered per MD orders.  Support and encouragement given throughout day. R:  Denied HI.  SI, contracts for safety.  Denied A/V hallucinations.   15 minutes checks continue for patient's safety.   Safety maintained.

## 2012-08-19 NOTE — [facility] Group Notes (Signed)
[facility] LCSW Aftercare Discharge Planning Group Note   08/19/2012 10:06 AM  Participation Quality:  Appropriate  Mood/Affect:  Appropriate and Depressed  Depression Rating:  7/8  Anxiety Rating:  0  Thoughts of Suicide:  No Will you contract for safety?   NA  Current AVH:  No  Plan for Discharge/Comments:  Patient reports feeling "rough" today.  He denies SI/HI but states he is unable to get beyond the depression. Patient shared he does not plan to rush out of the hospital as he did on the last admission.  Transportation Means: Patient has transportation.  Supports:  Patient has limited support system.  Jejuan Scala, Joesph July

## 2012-08-19 NOTE — Progress Notes (Signed)
Psychoeducational Group Note  Date:  08/19/2012 Time:  1100  Group Topic/Focus:  Therapeutic Activity- "Apples to Apples"  Participation Level: Did Not Attend  Participation Quality:  Not Applicable  Affect:  Not Applicable  Cognitive:  Not Applicable  Insight:  Not Applicable  Engagement in Group: Not Applicable  Additional Comments:  Pt was in a consult with doctor during group.  Sharyn Lull 08/19/2012, 1:34 PM

## 2012-08-19 NOTE — [facility] Group Notes (Signed)
[facility] LCSW Group Therapy      Feelings About Diagnosis 1:15 - 2:30 PM          08/19/2012 12:35 PM  Type of Therapy:  Group Therapy  Participation Level:  Minimal  Participation Quality:  Appropriate  Affect:  Appropriate and Depressed  Cognitive:  Appropriate  Insight:  Improving  Engagement in Therapy:  Improving  Modes of Intervention: Discussion, Education, Exploration, Problem-Solving, Rapport Building, Support  Summary of Progress/Problems:  Patient shared having a diagnosis of depression is not as frightening as finding out he had MS.  He shared he has to take his medication and follow up with appointments for both illnesses.  [name] 08/19/2012, 12:35 PM

## 2012-08-19 NOTE — Progress Notes (Signed)
D: Patient in the dayroom on approach.  Patient states his day was up and down.  Patient states for the last couple of months he can get his thoughts together in his mind but when he says the thoughts he haas a hard time gething his thoughts out.  Patient state she believes it is part of his MS. Patient states he is has passive SI patient denies HI and denies AVH. A: Staff to monitor Q 15 mins for safety.  Encouragement and support offered.  No scheduled medications administered per orders.   R: Patient remains safe on the unit.  Patient attended group tonight.  Patient cooperative and calm.  Patient using cane on the unit.  No medications administered tonight.

## 2012-08-19 NOTE — [facility] Suicide Risk Assessment (Signed)
[facility] INPATIENT:  Family/Significant Other Suicide Prevention Education  Suicide Prevention Education:  Education Completed; Joycie Peek, Wife, [phone] has been identified by the patient as the family member/significant other with whom the patient will be residing, and identified as the person(s) who will aid the patient in the event of a mental health crisis (suicidal ideations/suicide attempt).  With written consent from the patient, the family member/significant other has been provided the following suicide prevention education, prior to the and/or following the discharge of the patient.  The suicide prevention education provided includes the following:  Suicide risk factors  Suicide prevention and interventions  National Suicide Hotline telephone number  [facility] assessment telephone number  [facility] Emergency Assistance 911  [facility] and/or Residential Mobile Crisis Unit telephone number  Request made of family/significant other to:  Remove weapons (e.g., guns, rifles, knives), all items previously/currently identified as safety concern.  Wife reports patient does not have access to guns.  Remove drugs/medications (over-the-counter, prescriptions, illicit drugs), all items previously/currently identified as a safety concern.  The family member/significant other verbalizes understanding of the suicide prevention education information provided.  The family member/significant other agrees to remove the items of safety concern listed above.  [name] 08/19/2012, 9:56 AM

## 2012-08-19 NOTE — Progress Notes (Signed)
[facility] MD Progress Note  08/19/2012 11:33 AM [name]  MRN:  [id number] Subjective:  8/10 depression, no anxiety, frustrated with another group home member that won't go back to his house and bothers him, sleep was fair--he awoke around 3 am and did not sleep well after awaking, appetite fair.  When asked about suicidal thoughts, he stated, "probably".  He expanded by saying he wanted his old life back with his wife and son, divorced for almost two years.  [name] stated "some back pain" but not unusual for him. Diagnosis:   Axis I: Anxiety Disorder NOS and Major Depression, Recurrent severe Axis II: Deferred Axis III:  Past Medical History  Diagnosis Date  . Mental disorder   . Depression   . Neuromuscular disorder   . Multiple sclerosis, relapsing-remitting   . MS (multiple sclerosis)    Axis IV: economic problems, other psychosocial or environmental problems, problems related to social environment and problems with primary support group Axis V: 41-50 serious symptoms  ADL's:  Intact  Sleep: Fair  Appetite:  Fair  Suicidal Ideation:  Plan:  None Intent:  None Means:  None Homicidal Ideation:  Denies  Psychiatric Specialty Exam: Review of Systems  Constitutional: Negative.   HENT: Negative.   Eyes: Negative.   Respiratory: Negative.   Cardiovascular: Negative.   Gastrointestinal: Negative.   Genitourinary: Negative.   Musculoskeletal: Positive for back pain.  Skin: Negative.   Neurological: Negative.   Endo/Heme/Allergies: Negative.   Psychiatric/Behavioral: Positive for depression.    Blood pressure 104/67, pulse 75, temperature 98.5 F (36.9 C), temperature source Oral, resp. rate 20, height 5' 10.5" (1.791 m), weight 84.369 kg (186 lb).Body mass index is 26.3 kg/(m^2).  General Appearance: Casual  Eye Contact::  Fair  Speech:  Normal Rate  Volume:  Normal  Mood:  Anxious and Depressed  Affect:  Congruent  Thought Process:  Coherent  Orientation:  Full (Time,  Place, and Person)  Thought Content:  WDL  Suicidal Thoughts:  Yes.  without intent/plan  Homicidal Thoughts:  No  Memory:  Immediate;   Fair Recent;   Fair Remote;   Fair  Judgement:  Fair  Insight:  Fair  Psychomotor Activity:  Decreased  Concentration:  Fair  Recall:  Fair  Akathisia:  No  Handed:  Left  AIMS (if indicated):     Assets:  Communication Skills Resilience  Sleep:  Number of Hours: 4   Current Medications: Current Facility-Administered Medications  Medication Dose Route Frequency Provider Last Rate Last Dose  . acetaminophen (TYLENOL) tablet 650 mg  650 mg Oral Q6H PRN Shuvon Rankin, NP      . albuterol (PROVENTIL HFA;VENTOLIN HFA) 108 (90 BASE) MCG/ACT inhaler 2 puff  2 puff Inhalation QID Shuvon Rankin, NP      . alum & mag hydroxide-simeth (MAALOX/MYLANTA) 200-200-20 MG/5ML suspension 30 mL  30 mL Oral Q4H PRN Shuvon Rankin, NP      . ARIPiprazole (ABILIFY) tablet 2 mg  2 mg Oral Daily Shuvon Rankin, NP   2 mg at 08/19/12 0749  . benztropine (COGENTIN) tablet 0.5 mg  0.5 mg Oral BID Shuvon Rankin, NP   0.5 mg at 08/19/12 0749  . buPROPion (WELLBUTRIN XL) 24 hr tablet 150 mg  150 mg Oral Daily Shuvon Rankin, NP   150 mg at 08/19/12 0750  . Dimethyl Fumarate CPDR 240 mg  240 mg Oral BID Wateen Varon, MD   240 mg at 08/19/12 0751  . escitalopram (LEXAPRO) tablet 10  mg  10 mg Oral Daily Shuvon Rankin, NP   10 mg at 08/19/12 0751  . magnesium hydroxide (MILK OF MAGNESIA) suspension 30 mL  30 mL Oral Daily PRN Shuvon Rankin, NP        Lab Results:  Results for orders placed during the hospital encounter of 08/17/12 (from the past 48 hour(s))  CBC     Status: None   Collection Time    08/17/12  7:53 PM      Result Value Range   WBC 6.1  4.0 - 10.5 K/uL   RBC 4.44  4.22 - 5.81 MIL/uL   Hemoglobin 13.3  13.0 - 17.0 g/dL   HCT 47.8  29.5 - 62.1 %   MCV 88.5  78.0 - 100.0 fL   MCH 30.0  26.0 - 34.0 pg   MCHC 33.8  30.0 - 36.0 g/dL   RDW 30.8  65.7 - 84.6 %    Platelets 191  150 - 400 K/uL  COMPREHENSIVE METABOLIC PANEL     Status: Abnormal   Collection Time    08/17/12  7:53 PM      Result Value Range   Sodium 140  135 - 145 mEq/L   Potassium 3.7  3.5 - 5.1 mEq/L   Chloride 107  96 - 112 mEq/L   CO2 26  19 - 32 mEq/L   Glucose, Bld 139 (*) 70 - 99 mg/dL   BUN 17  6 - 23 mg/dL   Creatinine, Ser 9.62  0.50 - 1.35 mg/dL   Calcium 9.1  8.4 - 95.2 mg/dL   Total Protein 6.5  6.0 - 8.3 g/dL   Albumin 3.5  3.5 - 5.2 g/dL   AST 14  0 - 37 U/L   ALT 15  0 - 53 U/L   Alkaline Phosphatase 53  39 - 117 U/L   Total Bilirubin 0.3  0.3 - 1.2 mg/dL   GFR calc non Af Amer 80 (*) >90 mL/min   GFR calc Af Amer >90  >90 mL/min   Comment:            The eGFR has been calculated     using the CKD EPI equation.     This calculation has not been     validated in all clinical     situations.     eGFR's persistently     <90 mL/min signify     possible Chronic Kidney Disease.  HEPATIC FUNCTION PANEL     Status: None   Collection Time    08/17/12  7:53 PM      Result Value Range   Total Protein 6.4  6.0 - 8.3 g/dL   Albumin 3.6  3.5 - 5.2 g/dL   AST 14  0 - 37 U/L   ALT 17  0 - 53 U/L   Alkaline Phosphatase 53  39 - 117 U/L   Total Bilirubin 0.3  0.3 - 1.2 mg/dL   Bilirubin, Direct <8.4  0.0 - 0.3 mg/dL   Indirect Bilirubin NOT CALCULATED  0.3 - 0.9 mg/dL  TSH     Status: None   Collection Time    08/17/12  7:53 PM      Result Value Range   TSH 1.867  0.350 - 4.500 uIU/mL  URINALYSIS, ROUTINE W REFLEX MICROSCOPIC     Status: None   Collection Time    08/18/12 11:11 AM      Result Value Range   Color, Urine YELLOW  YELLOW   APPearance CLEAR  CLEAR   Specific Gravity, Urine 1.023  1.005 - 1.030   pH 6.0  5.0 - 8.0   Glucose, UA NEGATIVE  NEGATIVE mg/dL   Hgb urine dipstick NEGATIVE  NEGATIVE   Bilirubin Urine NEGATIVE  NEGATIVE   Ketones, ur NEGATIVE  NEGATIVE mg/dL   Protein, ur NEGATIVE  NEGATIVE mg/dL   Urobilinogen, UA 1.0  0.0 - 1.0  mg/dL   Nitrite NEGATIVE  NEGATIVE   Leukocytes, UA NEGATIVE  NEGATIVE   Comment: MICROSCOPIC NOT DONE ON URINES WITH NEGATIVE PROTEIN, BLOOD, LEUKOCYTES, NITRITE, OR GLUCOSE <1000 mg/dL.  URINE RAPID DRUG SCREEN (HOSP PERFORMED)     Status: None   Collection Time    08/18/12 11:11 AM      Result Value Range   Opiates NONE DETECTED  NONE DETECTED   Cocaine NONE DETECTED  NONE DETECTED   Benzodiazepines NONE DETECTED  NONE DETECTED   Amphetamines NONE DETECTED  NONE DETECTED   Tetrahydrocannabinol NONE DETECTED  NONE DETECTED   Barbiturates NONE DETECTED  NONE DETECTED   Comment:            DRUG SCREEN FOR MEDICAL PURPOSES     ONLY.  IF CONFIRMATION IS NEEDED     FOR ANY PURPOSE, NOTIFY LAB     WITHIN 5 DAYS.                LOWEST DETECTABLE LIMITS     FOR URINE DRUG SCREEN     Drug Class       Cutoff (ng/mL)     Amphetamine      1000     Barbiturate      200     Benzodiazepine   200     Tricyclics       300     Opiates          300     Cocaine          300     THC              50    Physical Findings: AIMS: Facial and Oral Movements Muscles of Facial Expression: None, normal Lips and Perioral Area: None, normal Jaw: None, normal Tongue: None, normal,Extremity Movements Upper (arms, wrists, hands, fingers): None, normal Lower (legs, knees, ankles, toes): None, normal, Trunk Movements Neck, shoulders, hips: None, normal, Overall Severity Severity of abnormal movements (highest score from questions above): None, normal Incapacitation due to abnormal movements: None, normal Patient's awareness of abnormal movements (rate only patient's report): No Awareness, Dental Status Current problems with teeth and/or dentures?: No Does patient usually wear dentures?: No  CIWA:  CIWA-Ar Total: 1 COWS:  COWS Total Score: 1  Treatment Plan Summary: Daily contact with patient to assess and evaluate symptoms and progress in treatment Medication management  Plan:  Review of chart,  vital signs, medications, and notes. 1-Individual and group therapy 2-Medication management for depression and anxiety:  Medications reviewed with the patient and he stated no untoward effects, no medication changes made 3-Coping skills for depression and pain issues 4-Continue crisis stabilization and management 5-Address health issues--monitoring vital signs, stable 6-Treatment plan in progress to prevent relapse of depression and suicidal ideations  Medical Decision Making Problem Points:  Established problem, stable/improving (1) and Review of psycho-social stressors (1) Data Points:  Review of medication regiment & side effects (2)  I certify that inpatient services furnished can reasonably be expected to improve the patient's condition.  [name], PMH-NP 08/19/2012, 11:33 AM

## 2012-08-19 NOTE — [facility] Group Notes (Deleted)
[facility] LCSW Aftercare Discharge Planning Group Note   08/19/2012 10:02 AM  Participation Quality:  Appropriate  Mood/Affect:  Appropriate and Depressed  Depression Rating:  3  Anxiety Rating:  3  Thoughts of Suicide:  No  Will you contract for safety?   NA  Current AVH:  No  Plan for Discharge/Comments:  Patient reports being tired due to having difficulty sleeping last night.  He was not able to identify any thoughts or anything that would have kept him from sleeping.  He advised he has had problems with sleep for the past couple of weeks.  He is hopeful to discharge home soon.  Transportation Means: Patient has transportation.   Supports:  Research scientist (life sciences), Aeronautical engineer

## 2012-08-19 NOTE — Progress Notes (Signed)
Grief and Loss Group  Group members discussed significant losses in their life and their feelings associated with their losses. Members shared coping strategies that helped them through their grief and loss process.   Pt shared his history of losses including his divorce and his son, and feelings of hopelessness in his situation. Pt seemed to be stuck in his thought pattern of disempowerment i.e. That his current situation is attributed to his wife's fault. Pt provided support to other group members that was positive but seemed to resist group members' and facilitators' positive reframes.   Sherol Dade Counselor Intern Haroldine Laws

## 2012-08-19 NOTE — Progress Notes (Signed)
Adult Psychoeducational Group Note  Date:  08/19/2012 Time:  9:56 PM  Group Topic/Focus:  Wrap-Up Group:   The focus of this group is to help patients review their daily goal of treatment and discuss progress on daily workbooks.  Participation Level:  None  Participation Quality:  Attentive  Affect:  Depressed  Cognitive:  Appropriate  Insight: Lacking  Engagement in Group:  Poor  Modes of Intervention:  Discussion and Problem-solving  Additional Comments:  Patient attend group and was appropriate, however he did not wish to participate. After MHT expressed the importance of positive thinking and positive affirmations, patient shared "its all a lie."   Cannan Beeck R 08/19/2012, 9:56 PM

## 2012-08-20 MED ORDER — NABUMETONE 500 MG PO TABS
500.0000 mg | ORAL_TABLET | Freq: Two times a day (BID) | ORAL | Status: DC
  Administered 2012-08-20 – 2012-08-21 (×2): 500 mg via ORAL
  Filled 2012-08-20 (×6): qty 1

## 2012-08-20 NOTE — Progress Notes (Signed)
Recreation Therapy Notes  Date: [id number] Time: 3:05pm Location: [address] Day Room      Group Topic/Focus: Goal Setting  Participation Level: Active  Participation Quality: Appropriate and Attentive  Affect: Euthymic  Cognitive: Appropriate  Additional Comments: Activity: Chain Link ; Explanation: Patients were divided into team of two or three. Holding one hand behind back patients were asked to work together to link paper clips together. Patients were given a time limit and asked to set a goal for the number of paper clips they could link together.   Patient actively participated in discussion about goal setting and personal development. Patient stated goal setting has been difficult for him in the past. Patient with peer set a goal of linking 15 paper clips together. Patient with peers were unsuccessful at this goal, patient team was only able to link 9 paper clips together. Patient was unable to state what he learned from participating in this activity. Patient stated this activity was a source of frustration for him.   Marykay Lex Florian Chauca, LRT/CTRS  Jearl Klinefelter 08/20/2012 4:33 PM

## 2012-08-20 NOTE — Progress Notes (Signed)
D: Patient appropriate and cooperative with staff and peers. Patient's affect/mood is flat and depressed. He reported on the self inventory sheet that his sleep is fair, appetite/ability to pay attention is good and energy level is normal. Patient rated depression and feelings of hopelessness "9". He's attending groups and compliant with medication regimen.  A: Support and encouragement provided to patient. Scheduled medications administered per MD orders. Maintain Q15 minute checks for safety.  R: Patient receptive. Denies SI/HI/AVH. Patient remains safe.

## 2012-08-20 NOTE — BH Assessment (Signed)
Interdisciplinary Treatment Plan Update   Date Reviewed:  08/20/2012  Time Reviewed:  10:33 AM  Progress in Treatment:   Attending groups: Yes Participating in groups: Yes Taking medication as prescribed: Yes  Tolerating medication: Yes Family/Significant other contact made: Yes, contact made with group home. [name] understands diagnosis: Yes  Discussing [name] identified problems/goals with staff: Yes Medical problems stabilized or resolved: Yes Denies suicidal/homicidal ideation: Yes [name] has not harmed self or others: Yes  For review of initial/current [name] goals, please see plan of care.  Estimated Length of Stay:  1 day  Reasons for Continued Hospitalization:   New Problems/Goals identified:    Discharge Plan or Barriers:   Home with outpatient follow up  [facility].  Additional Comments:  MD to discontinue Abilify at ptient's request.  He reports being safe [facility].  He rates depression and anxiety at five.  Attendees:  [name]:  08/20/2012 10:33 AM   Signature: Patrick North, MD 08/20/2012 10:33 AM  Signature:  Robbie Louis, RN 08/20/2012 10:33 AM  Signature: Harold Barban, RN 08/20/2012 10:33 AM  Signature:  Sharin Grave Coordinator 08/20/2012 10:33 AM  Signature:   08/20/2012 10:33 AM  Signature:  Juline Patch, LCSW 08/20/2012 10:33 AM  Signature: Silverio Decamp, PMH-NP 08/20/2012 10:33 AM  Signature: Jerl Santos 08/20/2012 10:33 AM  Signature:  08/20/2012 10:33 AM  Signature:    Signature:    Signature:      Scribe for Treatment Team:   Juline Patch,  08/20/2012 10:33 AM

## 2012-08-20 NOTE — Progress Notes (Signed)
Patient ID: [name], male   DOB: [birth year], 57 y.o.   MRN: [id number] D: Pt. Visible on the unit in the hall and dayroom. Pt. Reports depression at "5" of 10. Pt. C/o of chronic lower back pain with MS. Pt. Given heat pack for relief as scheduled meds already been administered. A: Writer encouraged pt. Report concerns. Staff will monitor q51min for safety. Pt. Encouraged to attend group. R: Pt. Is safe on the unit. Pt. Attended group.

## 2012-08-20 NOTE — [facility] Suicide Risk Assessment (Signed)
[facility] INPATIENT:  Family/Significant Other Suicide Prevention Education  Suicide Prevention Education:  Patient Discharged to Other Healthcare Facility:  Suicide Prevention Education Not Provided: {PT. DISCHARGED TO OTHER HEALTHCARE FACILITY:SUICIDE PREVENTION EDUCATION NOT PROVIDED (CHL):  The patient is discharging to another healthcare facility for continuation of treatment.  The patient's medical information, including suicide ideations and risk factors, are a part of the medical information shared with the receiving healthcare facility.  Patient returning to [facility].  [name] 08/20/2012, 4:06 PM

## 2012-08-20 NOTE — Progress Notes (Signed)
Adult Psychoeducational Group Note  Date:  08/20/2012 Time:  11:15 AM  Group Topic/Focus:  Therapeutic Activity- Our Perception "Apples to Apples"  Participation Level:  Active  Participation Quality:  Appropriate, Attentive and Sharing  Affect:  Appropriate  Cognitive:  Alert and Appropriate  Insight: Appropriate  Engagement in Group:  Engaged  Modes of Intervention:  Activity  Additional Comments:  Pt was appropriate and sharing while attending group. Pt participated in a therapeutic activity that highlighted every patient's perception. Pt appeared pleasant and excited about the interactions that took place during this activity.   Sharyn Lull 08/20/2012, 11:15 AM

## 2012-08-20 NOTE — Progress Notes (Signed)
[facility] MD Progress Note  08/20/2012 12:56 PM [name]  MRN:  [id number] Subjective:  Patient denies suicidal thoughts, depressed and anxious but interacting well in the milieu. He expressed back pain again today--Relafen placed for back pain.  [name] is preparing for discharge tomorrow--should be 24 hours without suicidal thoughts at that time.  He is still upset about two people from another group home that is owned by the same owner coming over and "bumming coffee and cigarettes."  [name]'s depressive symptoms have improved--appetite and sleep, depression and anxiety.  Diagnosis:   Axis I: Anxiety Disorder NOS and Major Depression, Recurrent severe Axis II: Deferred Axis III:  Past Medical History  Diagnosis Date  . Mental disorder   . Depression   . Neuromuscular disorder   . Multiple sclerosis, relapsing-remitting   . MS (multiple sclerosis)    Axis IV: other psychosocial or environmental problems, problems related to social environment and problems with primary support group Axis V: 41-50 serious symptoms  ADL's:  Intact  Sleep: Fair  Appetite:  Fair  Suicidal Ideation:  Denies Homicidal Ideation:  Denies  Psychiatric Specialty Exam: Review of Systems  Constitutional: Negative.   HENT: Negative.   Eyes: Negative.   Respiratory: Negative.   Cardiovascular: Negative.   Gastrointestinal: Negative.   Genitourinary: Negative.   Musculoskeletal: Negative.   Skin: Negative.   Neurological: Negative.   Endo/Heme/Allergies: Negative.   Psychiatric/Behavioral: Positive for depression. The patient is nervous/anxious.     Blood pressure 124/81, pulse 79, temperature 97.5 F (36.4 C), temperature source Oral, resp. rate 16, height 5' 10.5" (1.791 m), weight 84.369 kg (186 lb).Body mass index is 26.3 kg/(m^2).  General Appearance: Casual  Eye Contact::  Fair  Speech:  Normal Rate  Volume:  Normal  Mood:  Anxious and Depressed  Affect:  Congruent  Thought Process:  Coherent   Orientation:  Full (Time, Place, and Person)  Thought Content:  WDL  Suicidal Thoughts:  No  Homicidal Thoughts:  No  Memory:  Immediate;   Fair Recent;   Fair Remote;   Fair  Judgement:  Fair  Insight:  Fair  Psychomotor Activity:  Normal  Concentration:  Fair  Recall:  Fair  Akathisia:  No  Handed:  Right  AIMS (if indicated):     Assets:  Communication Skills Resilience  Sleep:  Number of Hours: 6   Current Medications: Current Facility-Administered Medications  Medication Dose Route Frequency Provider Last Rate Last Dose  . acetaminophen (TYLENOL) tablet 650 mg  650 mg Oral Q6H PRN Shuvon Rankin, NP      . albuterol (PROVENTIL HFA;VENTOLIN HFA) 108 (90 BASE) MCG/ACT inhaler 2 puff  2 puff Inhalation QID Shuvon Rankin, NP      . alum & mag hydroxide-simeth (MAALOX/MYLANTA) 200-200-20 MG/5ML suspension 30 mL  30 mL Oral Q4H PRN Shuvon Rankin, NP      . benztropine (COGENTIN) tablet 0.5 mg  0.5 mg Oral BID Shuvon Rankin, NP   0.5 mg at 08/20/12 0806  . buPROPion (WELLBUTRIN XL) 24 hr tablet 150 mg  150 mg Oral Daily Shuvon Rankin, NP   150 mg at 08/20/12 0807  . Dimethyl Fumarate CPDR 240 mg  240 mg Oral BID Fifi Schindler, MD   240 mg at 08/20/12 0809  . escitalopram (LEXAPRO) tablet 10 mg  10 mg Oral Daily Shuvon Rankin, NP   10 mg at 08/20/12 0807  . magnesium hydroxide (MILK OF MAGNESIA) suspension 30 mL  30 mL Oral Daily PRN  Shuvon Rankin, NP        Lab Results: No results found for this or any previous visit (from the past 48 hour(s)).  Physical Findings: AIMS: Facial and Oral Movements Muscles of Facial Expression: None, normal Lips and Perioral Area: None, normal Jaw: None, normal Tongue: None, normal,Extremity Movements Upper (arms, wrists, hands, fingers): None, normal Lower (legs, knees, ankles, toes): None, normal, Trunk Movements Neck, shoulders, hips: None, normal, Overall Severity Severity of abnormal movements (highest score from questions above): None,  normal Incapacitation due to abnormal movements: None, normal Patient's awareness of abnormal movements (rate only patient's report): No Awareness, Dental Status Current problems with teeth and/or dentures?: No Does patient usually wear dentures?: No  CIWA:  CIWA-Ar Total: 0 COWS:  COWS Total Score: 0  Treatment Plan Summary: Daily contact with patient to assess and evaluate symptoms and progress in treatment Medication management  Plan:  Review of chart, vital signs, medications, and notes. 1-Individual and group therapy 2-Medication management for depression and anxiety:  Medications reviewed with the patient and he no longer wanted Abilify due to the expense and ED side effects, medication discontinued.  Relafen added for his back pain. 3-Coping skills for depression, anxiety, and pain 4-Continue crisis stabilization and management 5-Address health issues--monitoring vital signs, stable 6-Treatment plan in progress to prevent relapse of depression and anxiety  Medical Decision Making Problem Points:  Established problem, stable/improving (1) and Review of psycho-social stressors (1) Data Points:  Review of new medications or change in dosage (2)  I certify that inpatient services furnished can reasonably be expected to improve the patient's condition.   [name], PMH-NP 08/20/2012, 12:56 PM

## 2012-08-20 NOTE — Progress Notes (Signed)
Chaplain provided brief support with pt while rounding on unit.  Familiar with pt from previous admission.  Provided support around pt's rapid readmission.

## 2012-08-20 NOTE — [facility] Group Notes (Signed)
[facility] LCSW Group Therapy      Emotional Regulation 1:15 - 2:30 PM           08/20/2012 1:19 PM  Type of Therapy:  Group Therapy  Participation Level:  Minimal  Participation Quality:  Appropriate  Affect:  Appropriate  Cognitive:  Appropriate  Insight:  Limited  Engagement in Therapy:  Limited  Modes of Intervention:  Discussion, Education, Exploration, Problem-Solving, Rapport Building, Support  Summary of Progress/Problems:  Patient made supportive comments on other statement but did not identify an emotion he needed to regulate.  Yasemin Rabon Hairston 08/20/2012, 1:19 PM

## 2012-08-20 NOTE — Progress Notes (Signed)
Adult Psychoeducational Group Note  Date:  08/20/2012 Time:  12:06 PM  Group Topic/Focus:  Identifying Needs:   The focus of this group is to help patients identify their personal needs that have been historically problematic and identify healthy behaviors to address their needs.  Participation Level:  Active  Participation Quality:  Attentive  Affect:  Appropriate and Flat  Cognitive:  Confused  Insight: Lacking  Engagement in Group:  Engaged  Modes of Intervention:  Discussion, Education and Support  Additional Comments:  Pt goal is to manage pain and get through day  [name] T 08/20/2012, 12:06 PM

## 2012-08-21 MED ORDER — NABUMETONE 500 MG PO TABS: 500.0000 mg | ORAL_TABLET | Freq: Two times a day (BID) | ORAL | Status: DC

## 2012-08-21 MED ORDER — ESCITALOPRAM OXALATE 10 MG PO TABS: 10.0000 mg | ORAL_TABLET | Freq: Every morning | ORAL | Status: DC

## 2012-08-21 MED ORDER — ALBUTEROL SULFATE HFA 108 (90 BASE) MCG/ACT IN AERS: 2.0000 | INHALATION_SPRAY | Freq: Four times a day (QID) | RESPIRATORY_TRACT | Status: DC

## 2012-08-21 MED ORDER — BUPROPION HCL ER (XL) 150 MG PO TB24: 150.0000 mg | ORAL_TABLET | Freq: Every day | ORAL | Status: DC

## 2012-08-21 MED ORDER — DIMETHYL FUMARATE 240 MG PO CPDR: 240.0000 mg | DELAYED_RELEASE_CAPSULE | Freq: Two times a day (BID) | ORAL | Status: DC

## 2012-08-21 NOTE — Progress Notes (Signed)
[facility] Adult Case Management Discharge Plan :  Will you be returning to the same living situation after discharge: Yes,  return to group home At discharge, do you have transportation home?:Yes,  group home will pick patient up Do you have the ability to pay for your medications:Yes,  no barriers  Release of information consent forms completed and in the chart;  Patient's signature needed at discharge.  Patient to Follow up at: Follow-up Information   Follow up with [facility] On 08/22/2012. (Please go to [facility]'s walk in clinic on Friday, Aug 22, 2012 or any weekday between 8AM-3PM for medication managment.)    Contact information:   [address] [place], [place]  [address]  [phone]      Patient denies SI/HI:   Yes,  no reports    Safety Planning and Suicide Prevention discussed:  Yes,  yes completed with patient  Nail, Catalina Gravel 08/21/2012, 9:58 AM

## 2012-08-21 NOTE — [facility] Group Notes (Signed)
[facility] LCSW Aftercare Discharge Planning Group Note   08/21/2012 9:41 AM  Participation Quality:  Open, engaged  Mood/Affect:  Appropriate, happy and reports he is feeling very good today.  Depression Rating:  0  Anxiety Rating:  0  Thoughts of Suicide:  No Will you contract for safety?   NA  Current AVH:  NA  Plan for Discharge/Comments:  Patient to dc today and will return to his group home. Aftercare is in place and FL2 to be updated.  Transportation Means: Group home  Supports: limited   Nail, Catalina Gravel
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
## 2012-08-21 NOTE — Progress Notes (Signed)
D: Patient appropriate and cooperative with staff and peers. Patient's affect/mood is depressed. Patient complained of lower back pain 6/10. He reported on the self inventory sheet that his sleep is fair, appetite/ability to pay attention is good, and energy level is normal. Patient rated depression and feelings of hopelessness "7".  A: Support and encouragement provided to patient. Administered scheduled medications per ordering MD. Monitor Q15 minute checks for safety.  R: Patient receptive. Denies SI/HI/AVH. Patient remains safe.

## 2012-08-21 NOTE — [facility] Suicide Risk Assessment (Signed)
Suicide Risk Assessment  Discharge Assessment     Demographic Factors:  Male, Caucasian, Low socioeconomic status and Unemployed  Mental Status Per Nursing Assessment::   On Admission:  NA  Current Mental Status by Physician: Patient alert and oriented to 4. Denies AH/VH/SI/HI.  Loss Factors: Decline in physical health  Historical Factors: Family history of mental illness or substance abuse and Impulsivity  Risk Reduction Factors:   Positive coping skills or problem solving skills  Continued Clinical Symptoms:  Bipolar Disorder:   Mixed State  Cognitive Features That Contribute To Risk:  Cognitively intact  Suicide Risk:  Minimal: No identifiable suicidal ideation.  Patients presenting with no risk factors but with morbid ruminations; may be classified as minimal risk based on the severity of the depressive symptoms  Discharge Diagnoses:   AXIS I:  Bipolar, mixed AXIS II:  Deferred AXIS III:   Past Medical History  Diagnosis Date  . Mental disorder   . Depression   . Neuromuscular disorder   . Multiple sclerosis, relapsing-remitting   . MS (multiple sclerosis)    AXIS IV:  other psychosocial or environmental problems AXIS V:  61-70 mild symptoms  Plan Of Care/Follow-up recommendations:  Activity:  as tolerated Diet:  regular Follow up with outpatient appointments.  Is patient on multiple antipsychotic therapies at discharge:  No   Has Patient had three or more failed trials of antipsychotic monotherapy by history:  No  Recommended Plan for Multiple Antipsychotic Therapies: NA  [name] 08/21/2012, 9:48 AM

## 2012-08-21 NOTE — Progress Notes (Signed)
Discharge Note: Discharge instructions/prescriptions/home medication given to patient. Patient verbalized understanding of discharge instructions and prescriptions. Returned belongings to patient. Denies SI/HI/AVH. Patient d/c without incident.

## 2012-08-22 NOTE — Discharge Summary (Signed)
Physician Discharge Summary Note  Patient:  [name] is an 57 y.o., male MRN:  [id number] DOB:  Nov 07, 1955 Patient phone:  [phone] (home)  Patient address:   [address] [place] [address],   Date of Admission:  08/17/2012 Date of Discharge: 08/21/2012  Reason for Admission:  Depression with suicidal ideations  Discharge Diagnoses: Principal Problem:   Severe recurrent major depression without psychotic features  Review of Systems  Constitutional: Negative.   HENT: Negative.   Eyes: Negative.   Respiratory: Negative.   Cardiovascular: Negative.   Gastrointestinal: Negative.   Genitourinary: Negative.   Musculoskeletal: Positive for back pain.  Skin: Negative.   Neurological: Negative.   Endo/Heme/Allergies: Negative.   Psychiatric/Behavioral: Positive for depression.   Axis Diagnosis:   AXIS I:  Anxiety Disorder NOS and Major Depression, Recurrent severe AXIS II:  Deferred AXIS III:   Past Medical History  Diagnosis Date  . Mental disorder   . Depression   . Neuromuscular disorder   . Multiple sclerosis, relapsing-remitting   . MS (multiple sclerosis)    AXIS IV:  housing problems, other psychosocial or environmental problems, problems related to social environment and problems with primary support group AXIS V:  61-70 mild symptoms  Level of Care:  OP  Hospital Course:  During hospitalization:  Pt was recently admitted to [facility], and was discharged about 1 week ago. He reports that he has had SI during this entire time. He endorses depressed mood with symptoms noted in the "risk to self" assessment below. He notes that his mood is okay in the morning, but he gradually becomes more depressed as the day progresses. He denies any recent stressors, but he is afflicted with multiple sclerosis, is on disability, and lives in a family care home. During his recent admission to [facility] he was started on Abilify and he wonders if his mood problems could be an averse  reaction to this medication. Pt knows what he feels like when he is about to make a suicide attempt, and he therefore decided to seek help today before the problem got out of control.  Pt reports that even when he was discharged from [facility] he continued to experience SI. He had been inpatient for about a week and felt like it was time to go, so he denied having SI, even though he was, indeed, having suicidal thoughts. He regrets having decided to do this, and says that he had done the same thing years ago during an admission to [facility], with equally bad results. He reports having an elaborate plan to walk to the local [facility], buy a large quantity of OTC sleep aids along with alcohol, then rent a room at a local motel and overdose. He has been interrupted by the very supportive owner of his family care home while trying to execute this very same plan in the recent past. He has a history of too many attempts to count, mostly by overdose, and at times has been rescued only because he called a friend or 911 to have company on the telephone while he passed away, only to have help come before he died. Pt does not want to risk experiencing pain during an attempt: "I want to just go to sleep, never wake up." His most recent attempt was a few months ago.   Today patient has a hard time describing what led to his readmission. Patient does mention not being honest about his suicidal thoughts prior to his last discharge. He expresses  frustration with his living situation stating "Some of the other residents come over to where I live an bother me." Patient is concerned about the possibility that some of his medications are causing erectile dysfunction. He reports being on an ED medicine years ago with positive effect. Patient is open to going to have a physical with a Medical Doctor after his discharge stating "I guess I should have it checked out. And I'm long overdue for a checkup." The patient mentions "having no life." He is  only able to name his 72 year old son as a support. [name] mentions that on most days "I wake up feeling fine. But as the day goes on I get so depressed. I'm afraid I will end up following through on my plan to overdose." The patient is also concerned about the cost of his medications.   During hospitalization:  Medication managed--Albuterol for SOB continued from his home medications.  His Wellbutrin 150 mg daily and lexapro 10 mg daily continued also.  Relafen 500 mg BID started for his back pain, PRN.  Due to his concern with cost and erectile dysfunction issues, Abilify 36m daily and cogentin 0.5 mg for EPS prevention discontinued.  [name] was interactive and attended with participation in therapy groups.  Coping skills learned to help him manage his stressors, especially other group home members.  Patient denied suicidal/homicidal ideations and auditory/visual hallucinations, follow-up appointments encouraged to attend, Rx given.  [name] is mentally and physically stable for discharge.  Consults:  None  Significant Diagnostic Studies:  labs: Completed and reviewed, stable  Discharge Vitals:   Blood pressure 89/57, pulse 94, temperature 98.3 F (36.8 C), temperature source Oral, resp. rate 18, height 5' 10.5" (1.791 m), weight 84.369 kg (186 lb). Body mass index is 26.3 kg/(m^2). Lab Results:   No results found for this or any previous visit (from the past 72 hour(s)).  Physical Findings: AIMS: Facial and Oral Movements Muscles of Facial Expression: None, normal Lips and Perioral Area: None, normal Jaw: None, normal Tongue: None, normal,Extremity Movements Upper (arms, wrists, hands, fingers): None, normal Lower (legs, knees, ankles, toes): None, normal, Trunk Movements Neck, shoulders, hips: None, normal, Overall Severity Severity of abnormal movements (highest score from questions above): None, normal Incapacitation due to abnormal movements: None, normal Patient's awareness of abnormal  movements (rate only patient's report): No Awareness, Dental Status Current problems with teeth and/or dentures?: No Does patient usually wear dentures?: No  CIWA:  CIWA-Ar Total: 0 COWS:  COWS Total Score: 0  Psychiatric Specialty Exam: See Psychiatric Specialty Exam and Suicide Risk Assessment completed by Attending Physician prior to discharge.  Discharge destination:  Home  Is patient on multiple antipsychotic therapies at discharge:  No   Has Patient had three or more failed trials of antipsychotic monotherapy by history:  No Recommended Plan for Multiple Antipsychotic Therapies:  N/A   Discharge Orders   Future Orders Complete By Expires     Activity as tolerated - No restrictions  As directed     Diet - low sodium heart healthy  As directed         Medication List    STOP taking these medications       ARIPiprazole 2 MG tablet  Commonly known as:  ABILIFY     benztropine 0.5 MG tablet  Commonly known as:  COGENTIN      TAKE these medications     Indication   albuterol 108 (90 BASE) MCG/ACT inhaler  Commonly known as:  PROVENTIL HFA;VENTOLIN HFA  Inhale 2 puffs into the lungs 4 (four) times daily.   Indication:  Chronic Obstructive Lung Disease     buPROPion 150 MG 24 hr tablet  Commonly known as:  WELLBUTRIN XL  Take 1 tablet (150 mg total) by mouth daily.   Indication:  Major Depressive Disorder     Dimethyl Fumarate 240 MG Cpdr  Commonly known as:  TECFIDERA  Take 1 capsule (240 mg total) by mouth 2 (two) times daily.   Indication:  Multiple Sclerosis     escitalopram 10 MG tablet  Commonly known as:  LEXAPRO  Take 1 tablet (10 mg total) by mouth every morning. For Depression/Anxiety   Indication:  Depression, Generalized Anxiety Disorder     nabumetone 500 MG tablet  Commonly known as:  RELAFEN  Take 1 tablet (500 mg total) by mouth 2 (two) times daily.   Indication:  Joint Damage causing Pain and Loss of Function           Follow-up  Information   Follow up with [facility] On 08/22/2012. (Please go to [facility]'s walk in clinic on Friday, Aug 22, 2012 or any weekday between 8AM-3PM for medication managment.)    Contact information:   [address] [place], [place]  [address]  [phone]      Follow-up recommendations:  Activity:  As tolerated Diet:  Low-sodium heart healthy diet  Comments:  Patient will continue his care at [facility].  Total Discharge Time:  Greater than 30 minutes.  SignedNanine Means, PMH-NP 08/22/2012, 2:38 PM

## 2012-08-26 NOTE — Progress Notes (Signed)
Patient Discharge Instructions:  After Visit Summary (AVS):   Faxed to:  08/26/12 Discharge Summary Note:   Faxed to:  08/26/12 Psychiatric Admission Assessment Note:   Faxed to:  08/26/12 Suicide Risk Assessment - Discharge Assessment:   Faxed to:  08/26/12 Faxed/Sent to the Next Level Care provider:  08/26/12 Faxed to [facility] @ [phone]  Jerelene Redden, 08/26/2012, 4:19 PM

## 2012-09-07 ENCOUNTER — Encounter (HOSPITAL_COMMUNITY): Payer: Self-pay | Admitting: *Deleted

## 2012-09-07 ENCOUNTER — Emergency Department (HOSPITAL_COMMUNITY)
Admission: EM | Admit: 2012-09-07 | Discharge: 2012-09-08 | Disposition: A | Discharge status: Home / Self Care | Payer: Medicare Other | Attending: Emergency Medicine | Admitting: Emergency Medicine

## 2012-09-07 DIAGNOSIS — F17200 Nicotine dependence, unspecified, uncomplicated: Secondary | ICD-10-CM | POA: Insufficient documentation

## 2012-09-07 DIAGNOSIS — R45851 Suicidal ideations: Secondary | ICD-10-CM | POA: Insufficient documentation

## 2012-09-07 DIAGNOSIS — G35 Multiple sclerosis: Secondary | ICD-10-CM

## 2012-09-07 DIAGNOSIS — Z8669 Personal history of other diseases of the nervous system and sense organs: Secondary | ICD-10-CM | POA: Insufficient documentation

## 2012-09-07 DIAGNOSIS — Z79899 Other long term (current) drug therapy: Secondary | ICD-10-CM | POA: Insufficient documentation

## 2012-09-07 DIAGNOSIS — F332 Major depressive disorder, recurrent severe without psychotic features: Secondary | ICD-10-CM

## 2012-09-07 DIAGNOSIS — F32A Depression, unspecified: Secondary | ICD-10-CM

## 2012-09-07 DIAGNOSIS — F329 Major depressive disorder, single episode, unspecified: Secondary | ICD-10-CM | POA: Insufficient documentation

## 2012-09-07 DIAGNOSIS — F3289 Other specified depressive episodes: Secondary | ICD-10-CM | POA: Insufficient documentation

## 2012-09-07 LAB — COMPREHENSIVE METABOLIC PANEL
ALT: 13 U/L (ref 0–53)
AST: 14 U/L (ref 0–37)
Albumin: 3.9 g/dL (ref 3.5–5.2)
Alkaline Phosphatase: 58 U/L (ref 39–117)
BUN: 10 mg/dL (ref 6–23)
CO2: 27 mEq/L (ref 19–32)
Calcium: 9.2 mg/dL (ref 8.4–10.5)
Chloride: 106 mEq/L (ref 96–112)
Creatinine, Ser: 1.05 mg/dL (ref 0.50–1.35)
GFR calc Af Amer: 89 mL/min — ABNORMAL LOW (ref >90)
GFR calc non Af Amer: 77 mL/min — ABNORMAL LOW (ref >90)
Glucose, Bld: 84 mg/dL (ref 70–99)
Potassium: 4.4 mEq/L (ref 3.5–5.1)
Sodium: 140 mEq/L (ref 135–145)
Total Bilirubin: 0.3 mg/dL (ref 0.3–1.2)
Total Protein: 6.7 g/dL (ref 6.0–8.3)

## 2012-09-07 LAB — CBC
HCT: 42.1 % (ref 39.0–52.0)
Hemoglobin: 14.8 g/dL (ref 13.0–17.0)
MCH: 31.8 pg (ref 26.0–34.0)
MCHC: 35.2 g/dL (ref 30.0–36.0)
MCV: 90.3 fL (ref 78.0–100.0)
Platelets: 184 10*3/uL (ref 150–400)
RBC: 4.66 MIL/uL (ref 4.22–5.81)
RDW: 15 % (ref 11.5–15.5)
WBC: 4.3 10*3/uL (ref 4.0–10.5)

## 2012-09-07 LAB — SALICYLATE LEVEL: Salicylate Lvl: <2 mg/dL — ABNORMAL LOW (ref 2.8–20.0)

## 2012-09-07 LAB — RAPID URINE DRUG SCREEN, HOSP PERFORMED
Amphetamines: NOT DETECTED
Barbiturates: NOT DETECTED
Benzodiazepines: NOT DETECTED
Cocaine: NOT DETECTED
Opiates: NOT DETECTED
Tetrahydrocannabinol: NOT DETECTED

## 2012-09-07 LAB — ETHANOL: Alcohol, Ethyl (B): <11 mg/dL (ref 0–11)

## 2012-09-07 LAB — ACETAMINOPHEN LEVEL: Acetaminophen (Tylenol), Serum: <15 ug/mL (ref 10–30)

## 2012-09-07 MED ORDER — ALUM & MAG HYDROXIDE-SIMETH 200-200-20 MG/5ML PO SUSP: 30.0000 mL | ORAL | Status: DC | PRN

## 2012-09-07 MED ORDER — LORAZEPAM 1 MG PO TABS: 1.0000 mg | ORAL_TABLET | Freq: Three times a day (TID) | ORAL | Status: DC | PRN

## 2012-09-07 MED ORDER — BUPROPION HCL ER (XL) 150 MG PO TB24
150.0000 mg | ORAL_TABLET | Freq: Every day | ORAL | Status: DC
  Administered 2012-09-07 – 2012-09-08 (×2): 150 mg via ORAL
  Filled 2012-09-07 (×3): qty 1

## 2012-09-07 MED ORDER — NABUMETONE 500 MG PO TABS
500.0000 mg | ORAL_TABLET | Freq: Two times a day (BID) | ORAL | Status: DC
  Administered 2012-09-07: 500 mg via ORAL
  Filled 2012-09-07 (×5): qty 1

## 2012-09-07 MED ORDER — ZOLPIDEM TARTRATE 5 MG PO TABS: 5.0000 mg | ORAL_TABLET | Freq: Every evening | ORAL | Status: DC | PRN

## 2012-09-07 MED ORDER — ALBUTEROL SULFATE HFA 108 (90 BASE) MCG/ACT IN AERS
2.0000 | INHALATION_SPRAY | Freq: Four times a day (QID) | RESPIRATORY_TRACT | Status: DC
  Filled 2012-09-07: qty 6.7

## 2012-09-07 MED ORDER — ACETAMINOPHEN 325 MG PO TABS: 650.0000 mg | ORAL_TABLET | ORAL | Status: DC | PRN

## 2012-09-07 MED ORDER — DIMETHYL FUMARATE 240 MG PO CPDR
240.0000 mg | DELAYED_RELEASE_CAPSULE | Freq: Two times a day (BID) | ORAL | Status: DC
  Administered 2012-09-07 – 2012-09-08 (×2): 240 mg via ORAL

## 2012-09-07 MED ORDER — ONDANSETRON HCL 4 MG PO TABS: 4.0000 mg | ORAL_TABLET | Freq: Three times a day (TID) | ORAL | Status: DC | PRN

## 2012-09-07 MED ORDER — ESCITALOPRAM OXALATE 10 MG PO TABS
10.0000 mg | ORAL_TABLET | Freq: Every morning | ORAL | Status: DC
  Administered 2012-09-07 – 2012-09-08 (×2): 10 mg via ORAL
  Filled 2012-09-07 (×2): qty 1

## 2012-09-07 MED ORDER — IBUPROFEN 600 MG PO TABS: 600.0000 mg | ORAL_TABLET | Freq: Three times a day (TID) | ORAL | Status: DC | PRN

## 2012-09-07 NOTE — ED Provider Notes (Signed)
Medical screening examination/treatment/procedure(s) were performed by non-physician practitioner and as supervising physician I was immediately available for consultation/collaboration.  Juliet Rude. Rubin Payor, MD 09/07/12 [phone]

## 2012-09-07 NOTE — ED Notes (Signed)
Bed:WHALA<BR> Expected date:<BR> Expected time:<BR> Means of arrival:<BR> Comments:<BR> triage

## 2012-09-07 NOTE — ED Notes (Signed)
Pt from a group home, brought in by [employer] after pt called 911 due to SI with plan of going to a motel room and taking "a bunch of pills". Pt reports being treated for same about 2 weeks ago but reports that SI returned a couple days ago. Pt denies HI and AV hallucinations.
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
## 2012-09-07 NOTE — ED Notes (Signed)
Report given to [place], rn

## 2012-09-08 DIAGNOSIS — F339 Major depressive disorder, recurrent, unspecified: Secondary | ICD-10-CM

## 2012-09-08 NOTE — [facility] Suicide Risk Assessment (Signed)
Suicide Risk Assessment  Discharge Assessment     Demographic Factors:  Male, Adolescent or young adult, Divorced or widowed, Caucasian, Low socioeconomic status and Unemployed  Mental Status Per Nursing Assessment::   On Admission:     Current Mental Status by Physician: Self-harm thoughts  Loss Factors: Decline in physical health and Financial problems/change in socioeconomic status  Historical Factors: Prior suicide attempts  Risk Reduction Factors:   Sense of responsibility to family, Religious beliefs about death, Living with another person, especially a relative, Positive social support and Positive therapeutic relationship  Continued Clinical Symptoms:  Depression:   Anhedonia Impulsivity Personality Disorders:   Comorbid depression Previous Psychiatric Diagnoses and Treatments Medical Diagnoses and Treatments/Surgeries  Cognitive Features That Contribute To Risk:  Polarized thinking    Suicide Risk:  Minimal: No identifiable suicidal ideation.  Patients presenting with no risk factors but with morbid ruminations; may be classified as minimal risk based on the severity of the depressive symptoms  Discharge Diagnoses:   AXIS I:  Major Depression, Recurrent severe AXIS II:  Deferred AXIS III:   Past Medical History  Diagnosis Date  . Mental disorder   . Depression   . Neuromuscular disorder   . Multiple sclerosis, relapsing-remitting   . MS (multiple sclerosis)    AXIS IV:  economic problems, occupational problems, other psychosocial or environmental problems and problems related to social environment AXIS V:  51-60 moderate symptoms  Plan Of Care/Follow-up recommendations:  Activity:  as tolerated Diet:  regular  Is patient on multiple antipsychotic therapies at discharge:  No   Has Patient had three or more failed trials of antipsychotic monotherapy by history:  No  Recommended Plan for Multiple Antipsychotic Therapies: Not  applicable   Athira Janowicz,JANARDHAHA R. 09/08/2012, 11:32 AM

## 2012-09-08 NOTE — Consult Note (Signed)
Reason for Consult: Depression and suicidal ideation. Patient has history of multiple sclerosis Referring Physician: Dr. Hanley Ben is an 57 y.o. male.  HPI: Patient was seen and chart reviewed. Patient came to the [facility] long emergency department with the [facility] from a group home for suicidal ideations and chronic depression. Patient reported he has been suffering with the depression and feels worthless. Patient stated he does not want to be himself because he has 38 years old son who is Administrator, Civil Service lives with his mother. Patient reported his medications. If it was too expensive cannot afford and would like not to take the Abilify and lactic other medications. Review of his recent admission and detached indicated this the medication Abilify and Cogentin was discontinued. Patient seems to be confused about the his medications, which will be clarified with his group home staff members. Patient contracts for safety and  follow up with outpatient psychiatric services.  Mental Status Examination: Patient appeared as per his stated age, disheveled unshaven beard and poorly groomed, and maintaining good eye contact. Patient has fine mood and his affect was constricted. He has normal rate, rhythm, and volume of speech he has a difficult to express some of the warts because of multiple sclerosis which makes him frustrated. His thought process is linear and goal directed. Patient has denied suicidal, homicidal ideations, intentions or plans. Patient has no evidence of auditory or visual hallucinations, delusions, and paranoia. Patient has fair to poor insight judgment and impulse control.  Past Medical History  Diagnosis Date  . Mental disorder   . Depression   . Neuromuscular disorder   . Multiple sclerosis, relapsing-remitting   . MS (multiple sclerosis)     Past Surgical History  Procedure Laterality Date  . Tonsillectomy      History reviewed. No pertinent family  history.  Social History:  reports that he has been smoking Cigarettes.  He has a 18 pack-year smoking history. He has never used smokeless tobacco. He reports that  drinks alcohol. He reports that he does not use illicit drugs.  Allergies: No Known Allergies  Medications: I have reviewed the patient's current medications.  Results for orders placed during the hospital encounter of 09/07/12 (from the past 48 hour(s))  URINE RAPID DRUG SCREEN (HOSP PERFORMED)     Status: None   Collection Time    09/07/12 10:49 AM      Result Value Range   Opiates NONE DETECTED  NONE DETECTED   Cocaine NONE DETECTED  NONE DETECTED   Benzodiazepines NONE DETECTED  NONE DETECTED   Amphetamines NONE DETECTED  NONE DETECTED   Tetrahydrocannabinol NONE DETECTED  NONE DETECTED   Barbiturates NONE DETECTED  NONE DETECTED   Comment:            DRUG SCREEN FOR MEDICAL PURPOSES     ONLY.  IF CONFIRMATION IS NEEDED     FOR ANY PURPOSE, NOTIFY LAB     WITHIN 5 DAYS.                LOWEST DETECTABLE LIMITS     FOR URINE DRUG SCREEN     Drug Class       Cutoff (ng/mL)     Amphetamine      1000     Barbiturate      200     Benzodiazepine   200     Tricyclics       300     Opiates  300     Cocaine          300     THC              50  ACETAMINOPHEN LEVEL     Status: None   Collection Time    09/07/12 11:17 AM      Result Value Range   Acetaminophen (Tylenol), Serum <15.0  10 - 30 ug/mL   Comment:            THERAPEUTIC CONCENTRATIONS VARY     SIGNIFICANTLY. A RANGE OF 10-30     ug/mL MAY BE AN EFFECTIVE     CONCENTRATION FOR MANY PATIENTS.     HOWEVER, SOME ARE BEST TREATED     AT CONCENTRATIONS OUTSIDE THIS     RANGE.     ACETAMINOPHEN CONCENTRATIONS     >150 ug/mL AT 4 HOURS AFTER     INGESTION AND >50 ug/mL AT 12     HOURS AFTER INGESTION ARE     OFTEN ASSOCIATED WITH TOXIC     REACTIONS.  CBC     Status: None   Collection Time    09/07/12 11:17 AM      Result Value Range   WBC 4.3   4.0 - 10.5 K/uL   RBC 4.66  4.22 - 5.81 MIL/uL   Hemoglobin 14.8  13.0 - 17.0 g/dL   HCT 40.9  81.1 - 91.4 %   MCV 90.3  78.0 - 100.0 fL   MCH 31.8  26.0 - 34.0 pg   MCHC 35.2  30.0 - 36.0 g/dL   RDW 78.2  95.6 - 21.3 %   Platelets 184  150 - 400 K/uL  COMPREHENSIVE METABOLIC PANEL     Status: Abnormal   Collection Time    09/07/12 11:17 AM      Result Value Range   Sodium 140  135 - 145 mEq/L   Potassium 4.4  3.5 - 5.1 mEq/L   Chloride 106  96 - 112 mEq/L   CO2 27  19 - 32 mEq/L   Glucose, Bld 84  70 - 99 mg/dL   BUN 10  6 - 23 mg/dL   Creatinine, Ser 0.86  0.50 - 1.35 mg/dL   Calcium 9.2  8.4 - 57.8 mg/dL   Total Protein 6.7  6.0 - 8.3 g/dL   Albumin 3.9  3.5 - 5.2 g/dL   AST 14  0 - 37 U/L   ALT 13  0 - 53 U/L   Alkaline Phosphatase 58  39 - 117 U/L   Total Bilirubin 0.3  0.3 - 1.2 mg/dL   GFR calc non Af Amer 77 (*) >90 mL/min   GFR calc Af Amer 89 (*) >90 mL/min   Comment:            The eGFR has been calculated     using the CKD EPI equation.     This calculation has not been     validated in all clinical     situations.     eGFR's persistently     <90 mL/min signify     possible Chronic Kidney Disease.  ETHANOL     Status: None   Collection Time    09/07/12 11:17 AM      Result Value Range   Alcohol, Ethyl (B) <11  0 - 11 mg/dL   Comment:            LOWEST DETECTABLE LIMIT FOR  SERUM ALCOHOL IS 11 mg/dL     FOR MEDICAL PURPOSES ONLY  SALICYLATE LEVEL     Status: Abnormal   Collection Time    09/07/12 11:17 AM      Result Value Range   Salicylate Lvl <2.0 (*) 2.8 - 20.0 mg/dL    No results found.  Positive for anxiety, bad mood, behavior problems, depression, learning difficulty and multiple sclerosis. Blood pressure 98/59, pulse 70, temperature 97.6 F (36.4 C), temperature source Oral, resp. rate 18, height 5\' 11"  (1.803 m), weight 185 lb (83.915 kg), SpO2 98.00%.   Assessment/Plan: Maj. depressive disorder, recurrent without psychotic  symptoms  Recommendation: Patient does not meet criteria for acute psychiatric hospitalization and patient will be referred to the outpatient psychiatric services and discharged to group home. Patient to continue taking his home medication without Abilify.  Dewane Timson,JANARDHAHA R. 09/08/2012, 11:21 AM

## 2012-09-08 NOTE — Progress Notes (Addendum)
Addendum: Per patient, group home members are in day program until 2 and 3pm, and unsure if staff is at the house prior. CSW called at 230 pm and finally reached a member at [phone]. CSW spoke with Jinny Sanders, owner of group home who will be arranging transportation by 6pm tonight.    CSW called patient group home, [facility] [phone] and left message regarding patient anticipated discharge. CSW also called [facility] at [phone] to attempt to reach group home caregivers and unable to leave a message. CSW also called Jinny Sanders of group home at [phone] and unable to leave a message.   Catha Gosselin, LCSWA  [phone] .09/08/2012 11:38am

## 2012-09-08 NOTE — ED Provider Notes (Signed)
Patient sleeping during AM rounds.  Per RN, no acute events overnight. VSS  Placement pending.  Bernal Luhman, MD 09/08/12 0746 

## 2012-09-16 ENCOUNTER — Encounter (HOSPITAL_COMMUNITY): Payer: Self-pay

## 2012-09-16 ENCOUNTER — Inpatient Hospital Stay (HOSPITAL_COMMUNITY)
Admission: RE | Admit: 2012-09-16 | Discharge: 2012-09-25 | DRG: 885 | Payer: Medicare Other | Attending: Psychiatry | Admitting: Psychiatry

## 2012-09-16 ENCOUNTER — Emergency Department (HOSPITAL_COMMUNITY)
Admission: EM | Admit: 2012-09-16 | Discharge: 2012-09-16 | Disposition: A | Discharge status: Other Acute Inpatient Hospital | Payer: Medicare Other | Attending: Emergency Medicine | Admitting: Emergency Medicine

## 2012-09-16 ENCOUNTER — Encounter (HOSPITAL_COMMUNITY): Payer: Self-pay | Admitting: Emergency Medicine

## 2012-09-16 DIAGNOSIS — F329 Major depressive disorder, single episode, unspecified: Secondary | ICD-10-CM | POA: Diagnosis not present

## 2012-09-16 DIAGNOSIS — G35 Multiple sclerosis: Secondary | ICD-10-CM | POA: Diagnosis present

## 2012-09-16 DIAGNOSIS — F17200 Nicotine dependence, unspecified, uncomplicated: Secondary | ICD-10-CM | POA: Diagnosis not present

## 2012-09-16 DIAGNOSIS — F489 Nonpsychotic mental disorder, unspecified: Secondary | ICD-10-CM | POA: Insufficient documentation

## 2012-09-16 DIAGNOSIS — F332 Major depressive disorder, recurrent severe without psychotic features: Secondary | ICD-10-CM | POA: Diagnosis present

## 2012-09-16 DIAGNOSIS — F3289 Other specified depressive episodes: Secondary | ICD-10-CM | POA: Insufficient documentation

## 2012-09-16 DIAGNOSIS — F341 Dysthymic disorder: Secondary | ICD-10-CM

## 2012-09-16 DIAGNOSIS — R45851 Suicidal ideations: Secondary | ICD-10-CM | POA: Diagnosis not present

## 2012-09-16 DIAGNOSIS — F314 Bipolar disorder, current episode depressed, severe, without psychotic features: Secondary | ICD-10-CM | POA: Diagnosis present

## 2012-09-16 DIAGNOSIS — G709 Myoneural disorder, unspecified: Secondary | ICD-10-CM | POA: Insufficient documentation

## 2012-09-16 DIAGNOSIS — Z79899 Other long term (current) drug therapy: Secondary | ICD-10-CM

## 2012-09-16 DIAGNOSIS — Z0289 Encounter for other administrative examinations: Secondary | ICD-10-CM | POA: Diagnosis present

## 2012-09-16 DIAGNOSIS — Z008 Encounter for other general examination: Secondary | ICD-10-CM

## 2012-09-16 LAB — CBC
HCT: 42.9 % (ref 39.0–52.0)
Hemoglobin: 14.8 g/dL (ref 13.0–17.0)
MCH: 31 pg (ref 26.0–34.0)
MCHC: 34.5 g/dL (ref 30.0–36.0)
MCV: 89.7 fL (ref 78.0–100.0)
Platelets: 185 10*3/uL (ref 150–400)
RBC: 4.78 MIL/uL (ref 4.22–5.81)
RDW: 14.5 % (ref 11.5–15.5)
WBC: 5.1 10*3/uL (ref 4.0–10.5)

## 2012-09-16 LAB — COMPREHENSIVE METABOLIC PANEL
ALT: 9 U/L (ref 0–53)
AST: 12 U/L (ref 0–37)
Albumin: 4 g/dL (ref 3.5–5.2)
Alkaline Phosphatase: 59 U/L (ref 39–117)
BUN: 15 mg/dL (ref 6–23)
CO2: 24 mEq/L (ref 19–32)
Calcium: 9.2 mg/dL (ref 8.4–10.5)
Chloride: 107 mEq/L (ref 96–112)
Creatinine, Ser: 0.91 mg/dL (ref 0.50–1.35)
GFR calc Af Amer: >90 mL/min (ref >90)
GFR calc non Af Amer: >90 mL/min (ref >90)
Glucose, Bld: 83 mg/dL (ref 70–99)
Potassium: 4.3 mEq/L (ref 3.5–5.1)
Sodium: 140 mEq/L (ref 135–145)
Total Bilirubin: 0.3 mg/dL (ref 0.3–1.2)
Total Protein: 7.1 g/dL (ref 6.0–8.3)

## 2012-09-16 LAB — RAPID URINE DRUG SCREEN, HOSP PERFORMED
Amphetamines: NOT DETECTED
Barbiturates: NOT DETECTED
Benzodiazepines: NOT DETECTED
Cocaine: NOT DETECTED
Opiates: NOT DETECTED
Tetrahydrocannabinol: NOT DETECTED

## 2012-09-16 LAB — ETHANOL: Alcohol, Ethyl (B): <11 mg/dL (ref 0–11)

## 2012-09-16 MED ORDER — ESCITALOPRAM OXALATE 10 MG PO TABS
10.0000 mg | ORAL_TABLET | Freq: Every day | ORAL | Status: DC
  Administered 2012-09-16 – 2012-09-19 (×4): 10 mg via ORAL
  Filled 2012-09-16 (×7): qty 1

## 2012-09-16 MED ORDER — BUPROPION HCL ER (XL) 150 MG PO TB24
150.0000 mg | ORAL_TABLET | Freq: Every day | ORAL | Status: DC
  Administered 2012-09-17 – 2012-09-22 (×6): 150 mg via ORAL
  Filled 2012-09-16 (×9): qty 1

## 2012-09-16 MED ORDER — NABUMETONE 500 MG PO TABS
500.0000 mg | ORAL_TABLET | Freq: Two times a day (BID) | ORAL | Status: DC
  Administered 2012-09-16 – 2012-09-25 (×19): 500 mg via ORAL
  Filled 2012-09-16 (×26): qty 1

## 2012-09-16 MED ORDER — ALBUTEROL SULFATE HFA 108 (90 BASE) MCG/ACT IN AERS: 2.0000 | INHALATION_SPRAY | Freq: Four times a day (QID) | RESPIRATORY_TRACT | Status: DC | PRN

## 2012-09-16 MED ORDER — ALBUTEROL SULFATE HFA 108 (90 BASE) MCG/ACT IN AERS
2.0000 | INHALATION_SPRAY | Freq: Four times a day (QID) | RESPIRATORY_TRACT | Status: DC
  Filled 2012-09-16 (×2): qty 6.7

## 2012-09-16 MED ORDER — MAGNESIUM HYDROXIDE 400 MG/5ML PO SUSP: 30.0000 mL | Freq: Every day | ORAL | Status: DC | PRN

## 2012-09-16 MED ORDER — ACETAMINOPHEN 325 MG PO TABS: 650.0000 mg | ORAL_TABLET | Freq: Four times a day (QID) | ORAL | Status: DC | PRN

## 2012-09-16 MED ORDER — ALUM & MAG HYDROXIDE-SIMETH 200-200-20 MG/5ML PO SUSP: 30.0000 mL | ORAL | Status: DC | PRN
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
## 2012-09-16 NOTE — ED Notes (Signed)
Pt here from [facility], ready available bed after medically cleared. Pt is suicidal with a plan.

## 2012-09-16 NOTE — ED Provider Notes (Signed)
History     CSN: [id number]  Arrival date & time 09/16/12  1355   First MD Initiated Contact with Patient 09/16/12 1401      Chief Complaint  Patient presents with  . Medical Clearance    (Consider location/radiation/quality/duration/timing/severity/associated sxs/prior treatment) HPI Comments: 57 year old male with a past medical history of depression, mental disorder and multiple sclerosis presents to the emergency department for medical clearance. Patient has been accepted at behavioral health once he is medically cleared. He presents with mobile crisis who was called by the facility where patient goes for daytime activities after patient expressed suicidal ideations with a plan. His plan was to walk take a cab to a motel down the [address] from his group home and take a bunch of bottles of sleeping pills so he would not wake up. He has a history of multiple psychiatric admissions in the past. Currently denies any other symptoms or complaints.  The history is provided by the patient and a caregiver.    Past Medical History  Diagnosis Date  . Mental disorder   . Depression   . Neuromuscular disorder   . Multiple sclerosis, relapsing-remitting   . MS (multiple sclerosis)     Past Surgical History  Procedure Laterality Date  . Tonsillectomy      History reviewed. No pertinent family history.  History  Substance Use Topics  . Smoking status: Current Every Day Smoker -- 0.50 packs/day for 36 years    Types: Cigarettes  . Smokeless tobacco: Never Used  . Alcohol Use: Yes     Comment: "hardly drink except when I get into a hotel room, probably every 6 months or so"      Review of Systems  Psychiatric/Behavioral: Positive for suicidal ideas and dysphoric mood. Negative for self-injury.  All other systems reviewed and are negative.    Allergies  Review of patient's allergies indicates no known allergies.  Home Medications   Current Outpatient Rx  Name  Route  Sig   Dispense  Refill  . albuterol (PROVENTIL HFA;VENTOLIN HFA) 108 (90 BASE) MCG/ACT inhaler   Inhalation   Inhale 2 puffs into the lungs 4 (four) times daily.   1 Inhaler   0   . buPROPion (WELLBUTRIN XL) 150 MG 24 hr tablet   Oral   Take 1 tablet (150 mg total) by mouth daily.   30 tablet   0   . Dimethyl Fumarate (TECFIDERA) 240 MG CPDR   Oral   Take 1 capsule (240 mg total) by mouth 2 (two) times daily.   60 capsule   0   . escitalopram (LEXAPRO) 10 MG tablet   Oral   Take 1 tablet (10 mg total) by mouth every morning. For Depression/Anxiety   30 tablet   0   . nabumetone (RELAFEN) 500 MG tablet   Oral   Take 1 tablet (500 mg total) by mouth 2 (two) times daily.   60 tablet   0     There were no vitals taken for this visit.  Physical Exam  Nursing note and vitals reviewed. Constitutional: He is oriented to person, place, and time. He appears well-developed and well-nourished. No distress.  HENT:  Head: Normocephalic and atraumatic.  Mouth/Throat: Oropharynx is clear and moist.  Eyes: Conjunctivae and EOM are normal. Pupils are equal, round, and reactive to light.  Neck: Normal range of motion. Neck supple.  Cardiovascular: Normal rate, regular rhythm and normal heart sounds.   Pulmonary/Chest: Effort normal and breath sounds  normal.  Abdominal: Soft. Bowel sounds are normal. There is no tenderness.  Musculoskeletal: Normal range of motion. He exhibits no edema.  Neurological: He is alert and oriented to person, place, and time.  Skin: Skin is warm and dry. He is not diaphoretic.  Psychiatric: His speech is normal and behavior is normal. He exhibits a depressed mood. He expresses suicidal ideation. He expresses no homicidal ideation. He expresses suicidal plans.    ED Course  Procedures (including critical care time)  Labs Reviewed  CBC  COMPREHENSIVE METABOLIC PANEL  ETHANOL  URINE RAPID DRUG SCREEN (HOSP PERFORMED)   No results found.   1. Medical  clearance for psychiatric admission       MDM  Patient medically cleared for behavioral health Hospital.       Trevor Mace, [place] 09/16/12 1517

## 2012-09-16 NOTE — Tx Team (Signed)
Initial Interdisciplinary Treatment Plan  PATIENT STRENGTHS: (choose at least two) Average or above average intelligence Capable of independent living Communication skills General fund of knowledge Supportive family/friends  PATIENT STRESSORS: Financial difficulties Medication change or noncompliance   PROBLEM LIST: Problem List/Patient Goals Date to be addressed Date deferred Reason deferred Estimated date of resolution  Depression 09/16/12     Suicide Risk 09/16/12                                                DISCHARGE CRITERIA:  Adequate post-discharge living arrangements Improved stabilization in mood, thinking, and/or behavior Motivation to continue treatment in a less acute level of care Need for constant or close observation no longer present Reduction of life-threatening or endangering symptoms to within safe limits Verbal commitment to aftercare and medication compliance  PRELIMINARY DISCHARGE PLAN: Outpatient therapy Return to previous living arrangement  PATIENT/FAMIILY INVOLVEMENT: This treatment plan has been presented to and reviewed with the patient, [name], and/or family member, .  The patient and family have been given the opportunity to ask questions and make suggestions.  [name] 09/16/2012, 6:02 PM

## 2012-09-16 NOTE — ED Provider Notes (Signed)
Medical screening examination/treatment/procedure(s) were performed by non-physician practitioner and as supervising physician I was immediately available for consultation/collaboration.  Christopher J. Pollina, MD 09/16/12 1537 

## 2012-09-16 NOTE — Progress Notes (Signed)
Patient ID: [name], male   DOB: [birth year], 57 y.o.   MRN: [id number]  Pt admitted voluntarily as a walk-in. Pt states he is having passive thoughts of overdosing on sleeping pills. Pt states "I'm here all the time so I know the drill". Pt states he was just discharged two weeks ago. Pt currently living in a group home and states that he feels safe there. Pt verbally contracts for safety at this time. No s/s of distress noted.

## 2012-09-16 NOTE — Progress Notes (Signed)
Patient ID: [name], male   DOB: [birth year], 57 y.o.   MRN: [id number] Report given to [facility], RN

## 2012-09-17 ENCOUNTER — Encounter (HOSPITAL_COMMUNITY): Payer: Self-pay | Admitting: Psychiatry

## 2012-09-17 DIAGNOSIS — F411 Generalized anxiety disorder: Secondary | ICD-10-CM

## 2012-09-17 NOTE — Progress Notes (Signed)
D: Patient denies HI and auditory and visual hallucinations. The patient is positive for SI but verbally contracts for safety. The patient has a depressed mood and affect. The patient rates his depression an 8 out of 10 and his hopelessness a 9 out of 10 (1 low/10 high). The patient reports sleeping well and states that his appetite good and that his energy level is normal. The patient is attending groups on the unit and is interacting appropriately within the milieu.  A: Patient given emotional support from RN. Patient encouraged to come to staff with concerns and/or questions. Patient's medication routine continued. Patient's orders and plan of care reviewed.  R: Patient remains cooperative. Will continue to monitor patient q15 minutes for safety.

## 2012-09-17 NOTE — Progress Notes (Signed)
Writer observed patient sitting in the dayroom watching tv but minimal interaction with peers. Writer informed patient of scheduled inhaler due and he refused reporting that he does not have COPD. Patient reports that all the times he has been coming here this is not needed and wants it taken off. Patient reports passive si and verbally contracts for safety. Patient denies hi/a/v hallucinations. Support and encouragement offered, safety maintained on unit with 15 min checks, will continue to monitor.

## 2012-09-17 NOTE — Progress Notes (Signed)
Adult Psychoeducational Group Note  Date:  09/17/2012 Time:  1000  Group Topic/Focus:  Building Self Esteem:   The Focus of this group is helping patients become aware of the effects of self-esteem on their lives, the things they and others do that enhance or undermine their self-esteem, seeing the relationship between their level of self-esteem and the choices they make and learning ways to enhance self-esteem.  Participation Level:  Active  Participation Quality:  Appropriate and Attentive  Affect:  Appropriate  Cognitive:  Alert and Appropriate  Insight: Appropriate  Engagement in Group:  Engaged and Supportive  Modes of Intervention:  Activity, Discussion and Socialization  Additional Comments:  Patient appropriate and supportive.   Noah Charon 09/17/2012, 11:21 AM

## 2012-09-17 NOTE — [facility] Group Notes (Signed)
[facility] LCSW Group Therapy      Emotional Regulation 1:15 - 2:30 PM     09/17/2012 1:06 PM  Type of Therapy:  Group Therapy  Participation Level:  Minimal  Participation Quality:  Appropriate  Affect:  Appropriate and Depressed  Cognitive:  Appropriate  Insight:  Developing/Improving  Engagement in Therapy:  Developing/Improving  Modes of Intervention:  Discussion, Education, Exploration, Problem-Solving, Rapport Building, Support  Summary of Progress/Problems:  Patient listened attentively but did not participate in group.  [name] 09/17/2012, 1:06 PM

## 2012-09-17 NOTE — [facility] Counselor (Signed)
Adult Psychosocial Assessment Update Interdisciplinary Team  Previous Behavior Health Hospital admissions/discharges:  Admissions Discharges  Date: 08/17/12 Date:  08/21/12  Date: Date:  Date: Date:  Date: Date:  Date: Date:   Changes since the last Psychosocial Assessment (including adherence to outpatient mental health and/or substance abuse treatment, situational issues contributing to decompensation and/or relapse). Patient reports becoming increasing depressed and having SI.  Patient shared he has faced a reality that the only way for his life to be better is to kill himself.             Discharge Plan 1. Will you be returning to the same living situation after discharge?   Yes: No:      If no, what is your plan?    Yes, patient will return to [facility] at discharge.       2. Would you like a referral for services when you are discharged? Yes:     If yes, for what services?  No:       No.  Patient will continue to receive services through [facility].       Summary and Recommendations (to be completed by the evaluator) [name] is a 57 years old Caucasian male admitted with Major Depression Disorder. He will benefit from crisis stabilization, evaluation for medication, psycho-education groups for coping skills development, group therapy and case management for discharge planning.                        Signature:  [name], 09/17/2012 1:08 PM
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
## 2012-09-17 NOTE — Tx Team (Signed)
Interdisciplinary Treatment Plan Update   Date Reviewed:  09/17/2012  Time Reviewed:  9:56 AM  Progress in Treatment:   Attending groups: Yes Participating in groups: Yes Taking medication as prescribed: Yes  Tolerating medication: Yes Family/Significant other contact made: No, but will ask patient for consent for collateral contact Patient understands diagnosis: Yes  Discussing patient identified problems/goals with staff: Yes Medical problems stabilized or resolved: Yes Denies suicidal/homicidal ideation: Yes Patient has not harmed self or others: Yes  For review of initial/current patient goals, please see plan of care.  Estimated Length of Stay:  3-5 days  Reasons for Continued Hospitalization:  Anxiety Depression Medication stabilization Suicidal ideation  New Problems/Goals identified:    Discharge Plan or Barriers:  Patient will return to [facility]  Additional Comments:  Patient reports admitting to hospital with SI and increased depression.  He rates depression at eight/nine.   MD to evaluate for medication needs.  Attendees:  Patient:  09/17/2012 9:56 AM   Signature: Alexander North, MD 09/17/2012 9:56 AM  Signature:  Nestor Ramp  09/17/2012 9:56 AM  Signature: 09/17/2012 9:56 AM  Signature: 09/17/2012 9:56 AM  Signature:   09/17/2012 9:56 AM  Signature:  Juline Patch, LCSW 09/17/2012 9:56 AM  Signature: Silverio Decamp, PMH-NP 09/17/2012 9:56 AM  Signature:  Maseta Dorley,Care Coordinator 09/17/2012 9:56 AM  Signature:  09/17/2012 9:56 AM  Signature:    Signature:    Signature:      Scribe for Treatment Team:   Juline Patch,  09/17/2012 9:56 AM

## 2012-09-17 NOTE — Progress Notes (Signed)
Adult Psychoeducational Group Note  Date:  09/17/2012 Time:  2:31 AM  Group Topic/Focus:  Wrap-Up Group:   The focus of this group is to help patients review their daily goal of treatment and discuss progress on daily workbooks.  Participation Level:  Minimal  Participation Quality:  Inattentive and Resistant  Affect:  Angry, Blunted, Irritable and Labile  Cognitive:  Alert and Oriented  Insight: Lacking  Engagement in Group:  Lacking  Modes of Intervention:  Discussion and Education  Additional Comments:   Pt attended and minimally participated in group discussing goals. Pt stated negative comments throughout group and stated  "I am the world champion of isolating." Pt was encouraged to think of healthy coping skills to prevent him from isolating.   Dalia Heading 09/17/2012, 2:31 AM

## 2012-09-17 NOTE — Progress Notes (Signed)
Adult Psychoeducational Group Note  Date:  09/17/2012 Time:  6:56 PM  Group Topic/Focus:  Personal Choices and Values:   The focus of this group is to help patients assess and explore the importance of values in their lives, how their values affect their decisions, how they express their values and what opposes their expression.  Participation Level:  Minimal  Participation Quality:  Appropriate and Attentive  Affect:  Appropriate  Cognitive:  Appropriate  Insight: Appropriate and Good  Engagement in Group:  Developing/Improving and Improving  Modes of Intervention:  Discussion, Education, Limit-setting, Socialization and Support  Additional Comments:  [name] attended group and share when he wanted to speak during group. Patient defined values and choices and shared. Patient was asked to give writer a listing on whiteboard of negative and positive values. Patient completed identifying values worksheet of values that are important. Patient was asked to complete a value oriented life worksheet during personal development time.   Karleen Hampshire Brittini 09/17/2012, 6:56 PM

## 2012-09-17 NOTE — Progress Notes (Signed)
Recreation Therapy Notes  Date: 05.28.2014 Time: 3:00pm Location: 500 Hall Dayroom  Group Topic/Focus: Problem Solving  Participation Level:  Active  Participation Quality:  Appropriate  Affect:  Euthymic  Cognitive:  Appropriate  Additional Comments: Activity: Nuclear Holocaust Survival ; Explanation: Patients were given a scenario about surviving a nuclear holocaust. In addition to saving everyone in the room the group can save three of the following people: Scientist, Zorita Pang, Married Couple, A single pregnant woman, An Tourist information centre manager, An elderly woman, a Clinical research associate and a Librarian, academic. The group along with the final list would live in a bunker for an undetermined amount of time.   Group saved the following people off the list: Scientist, Zorita Pang and Single Pregnant Woman. Patient actively participated in group activity. Patient offered sound reasoning and justification for people he wanted on list. Patient listened to wrap up discussion about communication skills needed to accomplish activity. Patient interacted appropriately with peers and LRT.   Marykay Lex Joseph Johns, LRT/CTRS  Luvinia Lucy L 09/17/2012 5:01 PM

## 2012-09-17 NOTE — Progress Notes (Signed)
Chaplain saw pt in 500 day room.  Familiar with pt from previous admissions.  Pt with flat affect.  Expressed distress around re-admission to [facility], which he describes as the recommendation of his therapist and mobile crisis. Spoke with chaplain of ongoing SI, feeling as though he is "stuck in a cycle" and unable to find peace.  Stated "the only way I will find peace is to die."  Expressed care for his son as motivation for continuing to attempt to resolve his distress without resorting to suicide.   Chaplain provided brief support in day room with other pt's.  Will continue to follow during admission.    Belva Crome MDiv

## 2012-09-18 DIAGNOSIS — G35 Multiple sclerosis: Secondary | ICD-10-CM

## 2012-09-18 DIAGNOSIS — F314 Bipolar disorder, current episode depressed, severe, without psychotic features: Secondary | ICD-10-CM | POA: Diagnosis present

## 2012-09-18 MED ORDER — DIMETHYL FUMARATE 240 MG PO CPDR
240.0000 mg | DELAYED_RELEASE_CAPSULE | Freq: Two times a day (BID) | ORAL | Status: DC
  Administered 2012-09-18 – 2012-09-25 (×14): 240 mg via ORAL
  Filled 2012-09-18 (×12): qty 1

## 2012-09-18 NOTE — [facility] Group Notes (Signed)
[facility] LCSW Aftercare Discharge Planning Group Note     [facility] [place] 1:15 - 2:30 PM    09/18/2012 11:24 AM  Participation Quality:  Appropriate   Mood/Affect:  Appropriate and Depressed  Depression Rating:  8  Anxiety Rating:  0  Thoughts of Suicide:  No  Will you contract for safety?   NA  Current AVH:  No  Plan for Discharge/Comments:  Patient reports being very hopeless and helpless and rates symptoms at nine.  He is unable to stated any specific reason for the feelings. He plans to return to [facility] at discharge.  Transportation Means: Patient has transportation.   Supports: Patient has a good support system.   Kadeshia Kasparian, Joesph July

## 2012-09-18 NOTE — Progress Notes (Signed)
Patient ID: [name], male   DOB: 1955-11-24, 57 y.o.   MRN: [id number] [facility] MD Progress Note  09/18/2012 3:50 PM [name]  MRN:  [id number] Subjective:  "I'm so-so," followed by, "I haven't seen a doctor since I got here, I don't know what medications I'm on, I stopped the Abilify because I couldn't afford it. What are they gonna put in it's place?"  Objective: Patient is in his room for our 1:1 meeting today. He is frustrated and upset about not seeing a doctor and not having clear goals about what medication he is taking. He reports that Celexa and Welbutrin worked the best, he could afford those. His speech is clear and goal directed, although he stutters a bit, which he states is new and likely due to his MS. He makes good eye contact, affect is congruent with his mood, he is alert and oriented, cooperative and informative.  Diagnosis:   ADL's:  Intact  Sleep: Fair  Appetite:  Fair  Suicidal Ideation:  Denies Homicidal Ideation:  Denies  Psychiatric Specialty Exam: Review of Systems  Constitutional: Negative.   HENT: Negative.   Eyes: Negative.   Respiratory: Negative.   Cardiovascular: Negative.   Gastrointestinal: Negative.   Genitourinary: Negative.   Musculoskeletal: Negative.   Skin: Negative.   Neurological: Negative.   Endo/Heme/Allergies: Negative.   Psychiatric/Behavioral: Positive for depression. The patient is nervous/anxious.     Blood pressure 94/61, pulse 76, temperature 97.5 F (36.4 C), temperature source Oral, resp. rate 20, height 5\' 11"  (1.803 m), weight 81.647 kg (180 lb).Body mass index is 25.12 kg/(m^2).  General Appearance: Casual  Eye Contact::  Good  Speech:  Normal Rate  Volume:  Normal  Mood:  Anxious and Depressed frustrated  Affect:  Congruent  Thought Process:  Coherent  Orientation:  Full (Time, Place, and Person)  Thought Content:  WDL  Suicidal Thoughts:  No  Homicidal Thoughts:  No  Memory:  Immediate;   Fair Recent;    Fair Remote;   Fair  Judgement:  Fair  Insight:  Fair  Psychomotor Activity:  Normal  Concentration:  Fair  Recall:  Fair  Akathisia:  No  Handed:  Right  AIMS (if indicated):     Assets:  Communication Skills Resilience  Sleep:  Number of Hours: 6.75   Current Medications: Current Facility-Administered Medications  Medication Dose Route Frequency Provider Last Rate Last Dose  . acetaminophen (TYLENOL) tablet 650 mg  650 mg Oral Q6H PRN Cleotis Nipper, MD      . albuterol (PROVENTIL HFA;VENTOLIN HFA) 108 (90 BASE) MCG/ACT inhaler 2 puff  2 puff Inhalation Q6H PRN Kerry Hough, PA-C      . alum & mag hydroxide-simeth (MAALOX/MYLANTA) 200-200-20 MG/5ML suspension 30 mL  30 mL Oral Q4H PRN Cleotis Nipper, MD      . buPROPion (WELLBUTRIN XL) 24 hr tablet 150 mg  150 mg Oral Daily Cleotis Nipper, MD   150 mg at 09/18/12 0725  . Dimethyl Fumarate CPDR 240 mg  240 mg Oral BID Verne Spurr, PA-C      . escitalopram (LEXAPRO) tablet 10 mg  10 mg Oral Daily Cleotis Nipper, MD   10 mg at 09/18/12 0725  . magnesium hydroxide (MILK OF MAGNESIA) suspension 30 mL  30 mL Oral Daily PRN Cleotis Nipper, MD      . nabumetone (RELAFEN) tablet 500 mg  500 mg Oral BID Cleotis Nipper, MD   500  mg at 09/18/12 0725    Lab Results: No results found for this or any previous visit (from the past 48 hour(s)).  Physical Findings: AIMS: Facial and Oral Movements Muscles of Facial Expression: None, normal Lips and Perioral Area: None, normal Jaw: None, normal Tongue: None, normal,Extremity Movements Upper (arms, wrists, hands, fingers): None, normal Lower (legs, knees, ankles, toes): None, normal, Trunk Movements Neck, shoulders, hips: None, normal, Overall Severity Severity of abnormal movements (highest score from questions above): None, normal Incapacitation due to abnormal movements: None, normal Patient's awareness of abnormal movements (rate only patient's report): No Awareness, Dental Status Current  problems with teeth and/or dentures?: No Does patient usually wear dentures?: No  CIWA:    COWS:     Treatment Plan Summary: Daily contact with patient to assess and evaluate symptoms and progress in treatment Medication management  Plan:  Review of chart, vital signs, medications, and notes. 1-Individual and group therapy 2-Medication management for depression and anxiety:  Medications reviewed with the patient and he no longer wanted Abilify due to the expense and ED side effects, medication discontinued.  Relafen added for his back pain. 3-Coping skills for depression, anxiety, and pain 4-Continue crisis stabilization and management 5-Address health issues--monitoring vital signs, stable 6-Treatment plan in progress to prevent relapse of depression and anxiety 7. Reviewed patient 's chart, did restart his Tecfidera for his relapsing, remitting MS BID. 8. Agree with Lexapro and Welbutrin as a course of treatment that is effective and affordable. 9. ELOS: 1-2 days   Medical Decision Making Problem Points:  Established problem, stable/improving (1) and Review of psycho-social stressors (1) Data Points:  Review of new medications or change in dosage (2)  I certify that inpatient services furnished can reasonably be expected to improve the patient's condition.  Rona Ravens. Ruthvik Barnaby RPAC 3:57 PM 09/18/2012

## 2012-09-18 NOTE — Progress Notes (Signed)
Patient ID: [name], male   DOB: 1955/07/26, 57 y.o.   MRN: [id number] D: Patient presented with depressed mood and flat affect. Pt endorses passive suicidal ideation but contracts for safety.  Pt attended evening wrap up group and was appropriate. Pt was quite in group and refuse to talk about goals set.  Pt denies any needs or concerns.  Cooperative with assessment. No acute distressed noted at this time.   A: Met with pt 1:1.  Writer encouraged pt to discuss feelings. Pt encouraged to come to staff with any question or concerns. 15 minutes checks for safety.  R: Patient remains safe. Pt denies any adverse reaction from medications. Continue current POC.

## 2012-09-18 NOTE — Progress Notes (Signed)
[facility] Group Notes:  (Nursing/MHT/Case Management/Adjunct)  Date:  09/18/2012  Time:  1000  Type of Therapy:  Therauptic Activity   Participation Level:  Active  Participation Quality:  Appropriate and Attentive  Affect:  Appropriate  Cognitive:  Alert and Appropriate  Insight:  Appropriate and Good  Engagement in Group:  Engaged  Modes of Intervention:  Activity, Discussion and Education  Summary of Progress/Problems: Pt. Were asked to draw a bug without looking at the picture of the bug and were read instructions on the type of bug. Pt were not allowed to ask questions and instructions were read only twice. Pt shared the result of the bug drew with group and were asked questions such as would had be easier if they were allowed to ask a questions or talk amongst each other to get a better clarifications. Gwenevere Ghazi Patience 09/18/2012, 2:59 PM

## 2012-09-18 NOTE — [facility] Group Notes (Signed)
[facility] LCSW Group Therapy         [facility] [employer] [facility] [place] 1:15 - 2:30 PM     09/18/2012 11:27 AM  Type of Therapy:  Group Therapy  Participation Level:  Minimal  Participation Quality:  Appropriate  Affect:  Appropriate and Depressed  Cognitive:  Appropriate  Insight:  Developing/Improving  Engagement in Therapy:  Developing/Improving  Modes of Intervention:  Discussion, Education, Exploration, Problem-Solving, Rapport Building, Support  Summary of Progress/Problems: Patient listened attentively to speaker from [employer].  Patient asked questions about how speaker had overcome depression.  She shared still has times when she is depressed but not to the point of wanting to harm herself.   [name] 09/18/2012, 11:27 AM

## 2012-09-18 NOTE — Progress Notes (Signed)
D: Patient denies HI and auditory and visual hallucinations. The patient is positive for SI but verbally contracts for safety. The patient has a depressed mood and affect. The patient rates his depression an 9 out of 10 and his hopelessness a 9 out of 10 (1 low/10 high). The patient reports sleeping well and states that his appetite good and that his energy level is normal. The patient is attending groups on the unit and is interacting appropriately within the milieu.   A: Patient given emotional support from RN. Patient encouraged to come to staff with concerns and/or questions. Patient's medication routine continued. Patient's orders and plan of care reviewed.   R: Patient remains cooperative. Will continue to monitor patient q15 minutes for safety.

## 2012-09-18 NOTE — Progress Notes (Signed)
Pt attended Karaoke group; did not participate but was supportive of peers.  

## 2012-09-19 DIAGNOSIS — F3130 Bipolar disorder, current episode depressed, mild or moderate severity, unspecified: Secondary | ICD-10-CM

## 2012-09-19 MED ORDER — ESCITALOPRAM OXALATE 5 MG PO TABS
15.0000 mg | ORAL_TABLET | Freq: Every day | ORAL | Status: DC
  Administered 2012-09-20 – 2012-09-23 (×4): 15 mg via ORAL
  Filled 2012-09-19 (×6): qty 1

## 2012-09-19 NOTE — Progress Notes (Signed)
Patient ID: [name], male   DOB: [birth year], 57 y.o.   MRN: [id number] D: Patient presented with depressed mood and flat affect. Pt endorses passive suicidal ideation but contracts for safety.  Pt attended evening karaoke but was not engaged. Pt denies any needs or concerns.  Cooperative with assessment. No acute distressed noted at this time.   A:  Writer encouraged pt to discuss feelings. Pt encouraged to come to staff with any question or concerns. 15 minutes checks for safety.  R: Patient remains safe. Pt denies any adverse reaction from medications. Continue current POC.

## 2012-09-19 NOTE — Progress Notes (Signed)
[facility] MD Progress Note  09/19/2012 11:47 AM MUHSIN [name]  MRN:  [id number] Subjective:  Patient reports feeling bad about coming back to the hospital. Sates he has been more depressed lately. Having on and off suicidal thoughts.  Diagnosis:   Axis I: Bipolar, Depressed Axis II: Deferred Axis III:  Past Medical History  Diagnosis Date  . Mental disorder   . Depression   . Neuromuscular disorder   . Multiple sclerosis, relapsing-remitting   . MS (multiple sclerosis)    Axis IV: other psychosocial or environmental problems Axis V: 41-50 serious symptoms  ADL's:  Intact  Sleep: Poor  Appetite:  Fair   Psychiatric Specialty Exam: ROS  Blood pressure 99/72, pulse 76, temperature 98.8 F (37.1 C), temperature source Oral, resp. rate 16, height 5\' 11"  (1.803 m), weight 81.647 kg (180 lb).Body mass index is 25.12 kg/(m^2).  General Appearance: Disheveled  Eye Solicitor::  Fair  Speech:  Slow  Volume:  Decreased  Mood:  Anxious and Depressed  Affect:  Constricted and Depressed  Thought Process:  Circumstantial  Orientation:  Full (Time, Place, and Person)  Thought Content:  Rumination  Suicidal Thoughts:  Yes.  without intent/plan  Homicidal Thoughts:  No  Memory:  Immediate;   Fair Recent;   Fair Remote;   Fair  Judgement:  Fair  Insight:  Shallow  Psychomotor Activity:  Normal  Concentration:  Fair  Recall:  Fair  Akathisia:  No  Handed:  Right  AIMS (if indicated):     Assets:  Communication Skills Desire for Improvement  Sleep:  Number of Hours: 5.75   Current Medications: Current Facility-Administered Medications  Medication Dose Route Frequency Provider Last Rate Last Dose  . acetaminophen (TYLENOL) tablet 650 mg  650 mg Oral Q6H PRN Cleotis Nipper, MD      . albuterol (PROVENTIL HFA;VENTOLIN HFA) 108 (90 BASE) MCG/ACT inhaler 2 puff  2 puff Inhalation Q6H PRN Kerry Hough, PA-C      . alum & mag hydroxide-simeth (MAALOX/MYLANTA) 200-200-20 MG/5ML suspension 30  mL  30 mL Oral Q4H PRN Cleotis Nipper, MD      . buPROPion (WELLBUTRIN XL) 24 hr tablet 150 mg  150 mg Oral Daily Cleotis Nipper, MD   150 mg at 09/19/12 0743  . Dimethyl Fumarate CPDR 240 mg  240 mg Oral BID Verne Spurr, PA-C   240 mg at 09/19/12 0804  . [START ON 09/20/2012] escitalopram (LEXAPRO) tablet 15 mg  15 mg Oral Daily Ronette Hank, MD      . magnesium hydroxide (MILK OF MAGNESIA) suspension 30 mL  30 mL Oral Daily PRN Cleotis Nipper, MD      . nabumetone (RELAFEN) tablet 500 mg  500 mg Oral BID Cleotis Nipper, MD   500 mg at 09/19/12 4098    Lab Results: No results found for this or any previous visit (from the past 48 hour(s)).  Physical Findings: AIMS: Facial and Oral Movements Muscles of Facial Expression: None, normal Lips and Perioral Area: None, normal Jaw: None, normal Tongue: None, normal,Extremity Movements Upper (arms, wrists, hands, fingers): None, normal Lower (legs, knees, ankles, toes): None, normal, Trunk Movements Neck, shoulders, hips: None, normal, Overall Severity Severity of abnormal movements (highest score from questions above): None, normal Incapacitation due to abnormal movements: None, normal Patient's awareness of abnormal movements (rate only patient's report): No Awareness, Dental Status Current problems with teeth and/or dentures?: No Does patient usually wear dentures?: No  CIWA:  COWS:     Treatment Plan Summary: Daily contact with patient to assess and evaluate symptoms and progress in treatment Medication management  Plan: Increase Lexapro to 15mg  po qd. Provide supportive counselling and education.  Medical Decision Making Problem Points:  Established problem, stable/improving (1), Review of last therapy session (1) and Review of psycho-social stressors (1) Data Points:  Review of medication regiment & side effects (2) Review of new medications or change in dosage (2)  I certify that inpatient services furnished can reasonably be  expected to improve the patient's condition.   [name] 09/19/2012, 11:47 AM

## 2012-09-19 NOTE — Tx Team (Signed)
Interdisciplinary Treatment Plan Update   Date Reviewed:  09/19/2012  Time Reviewed:  9:53 AM  Progress in Treatment:   Attending groups: Yes Participating in groups: Yes Taking medication as prescribed: Yes  Tolerating medication: Yes Family/Significant other contact made: No, but will ask patient for consent for collateral contact Patient understands diagnosis: Yes  Discussing patient identified problems/goals with staff: Yes Medical problems stabilized or resolved: Yes Denies suicidal/homicidal ideation: Yes Patient has not harmed self or others: Yes  For review of initial/current patient goals, please see plan of care.  Estimated Length of Stay:2-4 days  Reasons for Continued Hospitalization:  Anxiety Depression Medication stabilization Suicidal ideation  New Problems/Goals identified:    Discharge Plan or Barriers:  Patient will return to [facility]  Additional Comments: Patient continues to endorse off/on SI but able to contract for safety.  MD to evaluate for any medication adjustments. Attendees:  Patient:  09/19/2012 9:53 AM   Signature: Patrick North, MD 09/19/2012 9:53 AM  Signature:  Nestor Ramp, RN 09/19/2012 9:53 AM  Signature:  Fransisca Kaufmann, [facility] 09/19/2012 9:53 AM  Signature:  Berneice Heinrich, RN 09/19/2012 9:53 AM  Signature:   09/19/2012 9:53 AM  Signature:  Juline Patch, LCSW 09/19/2012 9:53 AM  Signature:  09/19/2012 9:53 AM  Signature:  09/19/2012 9:53 AM  Signature:  09/19/2012 9:53 AM  Signature:    Signature:    Signature:      Scribe for Treatment Team:   Juline Patch,  09/19/2012 9:53 AM

## 2012-09-19 NOTE — Progress Notes (Signed)
The focus of this group is to help patients review their daily goal of treatment and discuss progress on daily workbooks.  [name] shared that today had been an okay day for him. Patient stated that something positive about today was that he got some positive affirmation about decisions he is making in his life from others.

## 2012-09-19 NOTE — Progress Notes (Signed)
D: Patient denies SI/HI and auditory and visual hallucinations. The patient has a depressed mood and affect. The patient rates his depression and hopelessness both a 9 out of 10 (1 low/10 high). The patient reports sleeping well and writes that his appetite is good and that his energy level is normal. The patient is attending groups and interacting appropriately within the milieu.  A: Patient given emotional support from RN. Patient encouraged to come to staff with concerns and/or questions. Patient's medication routine continued. Patient's orders and plan of care reviewed.  R: Patient remains cooperative. Will continue to monitor patient q15 minutes for safety.

## 2012-09-19 NOTE — Progress Notes (Signed)
Adult Psychoeducational Group Note  Date:  09/19/2012 Time:  11:00AM  Group Topic/Focus:  Relapse Prevention Planning:   The focus of this group is to define relapse and discuss the need for planning to combat relapse.  Participation Level:  Active  Participation Quality:  Appropriate  Affect:  Appropriate  Cognitive:  Appropriate  Insight: Appropriate  Engagement in Group:  Engaged  Modes of Intervention:  Discussion and Education  Additional Comments:    Barth Kirks 09/19/2012, 12:53 PM

## 2012-09-19 NOTE — [facility] Group Notes (Signed)
[facility] LCSW Group Therapy              Feelings Around Relapse        1:15-2:30 PM       09/19/2012 3:54 PM  Type of Therapy:  Group Therapy  Participation Level:  Minimal  Participation Quality:  Appropriate  Affect:  Appropriate and Depressed  Cognitive:  Appropriate  Insight:  Developing/Improving  Engagement in Therapy:  Developing/Improving  Modes of Intervention:  Discussion, Education, Exploration, Problem-Solving, Rapport Building, Support  Summary of Progress/Problems: Patient shared he knows he is relapsing when he is spending too much time on his bed and in isolation.  Patient talked about being bored and not having anything to do.  Patients encouraged to spend time out of doors and with other as much as possible.  [name] 09/19/2012, 3:54 PM

## 2012-09-19 NOTE — B[facility] Group Notes (Cosign Needed)
[facility] LCSW Aftercare Discharge Planning Group Note     [facility] 1:15 - 2:30 PM    09/19/2012 12:14 PM  Participation Quality:  Appropriate   Mood/Affect:  Appropriate and Depressed  Depression Rating: 9  Anxiety Rating:  0  Thoughts of Suicide:  No  Will you contract for safety?   NA  Current AVH:  No  Plan for Discharge/Comments:  Patient reports being very hopeless and helplessness due to thoughts of discharging back into the same lifestyle he was living prior to admission.  Patient shared he does not have problems with the group home.  Transportation Means: Patient has transportation.   Supports: Patient has a good support system.   Srishti Strnad, Joesph July

## 2012-09-20 DIAGNOSIS — F332 Major depressive disorder, recurrent severe without psychotic features: Principal | ICD-10-CM

## 2012-09-20 NOTE — [facility] Group Notes (Signed)
[facility] Group Notes:  (Clinical Social Work)  09/20/2012   3:00-4:00PM  Summary of Progress/Problems:   The main focus of today's process group was for the patient to identify something in their life that led to their hospitalization that they would like to change, then to discuss their motivation to change.  The Stages of Change were explained to the group, then each patient identified where they are in that process.  The patient expressed that he has come to the point in his life where he fully believes that he can only have true peace if he commits suicide.  He is in the contemplation stage of change with this, not sure if he wants to change it.  He has a hard time seeing how he can live with his MS pain, his problems in his life.  The second thing that he talked about changing is his anger at his father and his ex-wife.  His father was in the [employer], was never there, and patient was always angry, now is starting to understand maybe it wasn't his father's choice.  Likewise, he wonders if there is a similar reason for his ex-wife leaving him.  Type of Therapy:  Process Group  Participation Level:  Active  Participation Quality:  Attentive, Sharing and Supportive  Affect:  Appropriate and Blunted  Cognitive:  Appropriate and Oriented  Insight:  Developing/Improving  Engagement in Therapy:  Engaged  Modes of Intervention:  Clarification, Support and Processing, Exploration, Discussion   Ambrose Mantle, LCSW 09/20/2012, 4:47 PM

## 2012-09-20 NOTE — Progress Notes (Signed)
Patient ID: [name], male   DOB: 06-13-1955, 57 y.o.   MRN: [id number] D)  Had been out in the dayroom earlier this evening, went to bed early tonight and was lying in the dark in his room.  States has frequent thoughts of ending it all, that it seems to be the right thing to do, and that he could finally have peace.  States he has never had a peaceful life, has one son who comes to see him sometimes at the group home when his wife drops him off, but he sits on his phone the whole time, rather than talking.  Isn't happy at the group home, the tv was disconnected, has nothing to do there, no exercise to help his MS, feels hopeless, feels has no support. A)  Tried to offer support, encouraged him to try to continue to exercise to keep his strength, and to try to connect more with his son.  Tried to talk about the role a father plays in his child's life and encouraged him to connect more, but he seemed to think his son doesn't want to, is involved in his own life and with his friends. Will continue to monitor for safety R)  Remains depressed, safety maintained.

## 2012-09-20 NOTE — Progress Notes (Signed)
Psychoeducational Group Note  Date: 09/20/2012 Time:  1015  Group Topic/Focus:  Identifying Needs:   The focus of this group is to help patients identify their personal needs that have been historically problematic and identify healthy behaviors to address their needs.  Participation Level:  Active  Participation Quality:  Appropriate  Affect:  Appropriate  Cognitive:  Alert  Insight:  Improving  Engagement in Group:  Engaged  Additional Comments:    [name] A  

## 2012-09-20 NOTE — Progress Notes (Signed)
.  Psychoeducational Group Note    Date: 09/20/2012 Time:  0900   Goal Setting Purpose of Group: To be able to set a goal that is measurable and that can be accomplished in one day Participation Level:  Active  Participation Quality:  Appropriate  Affect:  Appropriate  Cognitive:  Alert  Insight:  Improving  Engagement in Group:  Engaged  Additional Comments:  Shown Dissinger A 

## 2012-09-20 NOTE — Progress Notes (Signed)
Patient ID: [name], male   DOB: [birth year], 57 y.o.   MRN: [id number] D: Patient presented with depressed mood and flat affect. Pt endorses passive suicidal ideation but contracts for safety.  Pt attended evening wrap up group and engaged in discussion.  Pt denies any needs or concerns.  Cooperative with assessment. No acute distressed noted at this time.   A:  Writer encouraged pt to discuss feelings. Pt encouraged to come to staff with any question or concerns. 15 minutes checks for safety.  R: Patient remains safe. Pt denies any adverse reaction from medications. Continue current POC.
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
## 2012-09-20 NOTE — Progress Notes (Signed)
[facility] MD Progress Note  09/20/2012 3:01 PM [name]  MRN:  [id number] Subjective:  7/10 depression, "I don't do anxiety".  Denies physical pain, sleep and appetite "good".  Dysthymic mood but thrives in the milieu here and is socializing with laughing and smiling--he states his mood is much better here with little to no thoughts of suicide--malingering symptoms exist despite redirection and encouragement of thinking positively.  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 Mental disorder   . Depression   . Neuromuscular disorder   . Multiple sclerosis, relapsing-remitting   . MS (multiple sclerosis)    Axis IV: other psychosocial or environmental problems, problems related to social environment and problems with primary support group Axis V: 41-50 serious symptoms  ADL's:  Intact  Sleep: Good  Appetite:  Fair  Suicidal Ideation:  Denies Homicidal Ideation:  Denies  Psychiatric Specialty Exam: Review of Systems  Constitutional: Negative.   HENT: Negative.   Eyes: Negative.   Respiratory: Negative.   Cardiovascular: Negative.   Gastrointestinal: Negative.   Genitourinary: Negative.   Musculoskeletal: Negative.   Skin: Negative.   Neurological: Negative.   Endo/Heme/Allergies: Negative.   Psychiatric/Behavioral: Positive for depression and suicidal ideas.    Blood pressure 106/69, pulse 65, temperature 97.8 F (36.6 C), temperature source Oral, resp. rate 18, height 5\' 11"  (1.803 m), weight 81.647 kg (180 lb).Body mass index is 25.12 kg/(m^2).  General Appearance: Casual  Eye Contact::  Fair  Speech:  Normal Rate  Volume:  Normal  Mood:  Anxious and Depressed  Affect:  Congruent  Thought Process:  Coherent  Orientation:  Full (Time, Place, and Person)  Thought Content:  WDL  Suicidal Thoughts:  No  Homicidal Thoughts:  No  Memory:  Immediate;    Fair Recent;   Fair Remote;   Fair  Judgement:  Fair  Insight:  Fair  Psychomotor Activity:  Decreased  Concentration:  Fair  Recall:  Fair  Akathisia:  No  Handed:  Right  AIMS (if indicated):     Assets:  Communication Skills Resilience Social Support  Sleep:  Number of Hours: 6   Current Medications: Current Facility-Administered Medications  Medication Dose Route Frequency Provider Last Rate Last Dose  . acetaminophen (TYLENOL) tablet 650 mg  650 mg Oral Q6H PRN Cleotis Nipper, MD      . albuterol (PROVENTIL HFA;VENTOLIN HFA) 108 (90 BASE) MCG/ACT inhaler 2 puff  2 puff Inhalation Q6H PRN Kerry Hough, PA-C      . alum & mag hydroxide-simeth (MAALOX/MYLANTA) 200-200-20 MG/5ML suspension 30 mL  30 mL Oral Q4H PRN Cleotis Nipper, MD      . buPROPion (WELLBUTRIN XL) 24 hr tablet 150 mg  150 mg Oral Daily Cleotis Nipper, MD   150 mg at 09/20/12 0845  . Dimethyl Fumarate CPDR 240 mg  240 mg Oral BID Verne Spurr, PA-C   240 mg at 09/20/12 0846  . escitalopram (LEXAPRO) tablet 15 mg  15 mg Oral Daily Himabindu Ravi, MD   15 mg at 09/20/12 0845  . magnesium hydroxide (MILK OF MAGNESIA) suspension 30 mL  30 mL Oral Daily PRN Cleotis Nipper, MD      . nabumetone (RELAFEN) tablet 500 mg  500 mg Oral BID Cleotis Nipper, MD   500 mg at 09/20/12 0845    Lab Results: No results found for this  or any previous visit (from the past 48 hour(s)).  Physical Findings: AIMS: Facial and Oral Movements Muscles of Facial Expression: None, normal Lips and Perioral Area: None, normal Jaw: None, normal Tongue: None, normal,Extremity Movements Upper (arms, wrists, hands, fingers): None, normal Lower (legs, knees, ankles, toes): None, normal, Trunk Movements Neck, shoulders, hips: None, normal, Overall Severity Severity of abnormal movements (highest score from questions above): None, normal Incapacitation due to abnormal movements: None, normal Patient's awareness of abnormal movements (rate only  patient's report): No Awareness, Dental Status Current problems with teeth and/or dentures?: No Does patient usually wear dentures?: No  CIWA:    COWS:     Treatment Plan Summary: Daily contact with patient to assess and evaluate symptoms and progress in treatment Medication management  Plan:  Review of chart, vital signs, medications, and notes. 1-Individual and group therapy 2-Medication management for depression and anxiety:  Medications reviewed with the patient and he stated no untoward effects, no changes made 3-Coping skills for depression, anxiety, and pain 4-Continue crisis stabilization and management 5-Address health issues--monitoring vital signs, stable 6-Treatment plan in progress to prevent relapse of depression and anxiety  Medical Decision Making Problem Points:  Established problem, stable/improving (1) and Review of psycho-social stressors (1) Data Points:  Review of medication regiment & side effects (2)  I certify that inpatient services furnished can reasonably be expected to improve the patient's condition.   [name], PMH-NP 09/20/2012, 3:01 PM I agreed with her findings and involved in the treatment plan

## 2012-09-21 DIAGNOSIS — F322 Major depressive disorder, single episode, severe without psychotic features: Secondary | ICD-10-CM

## 2012-09-21 NOTE — Progress Notes (Signed)
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  Negative for fever, chills, weight loss, malaise/fatigue and diaphoresis.  HENT: Negative for congestion and sore throat.   Eyes: Negative for blurred vision, double vision and photophobia.  Respiratory: Negative for cough, shortness of breath and wheezing.   Cardiovascular: Negative for chest pain, palpitations and PND.  Gastrointestinal: Negative for heartburn, nausea, vomiting, abdominal pain, diarrhea and constipation.  Musculoskeletal: Negative for myalgias, joint pain and falls.  Neurological: Negative for dizziness, tingling, tremors, sensory change, speech change, focal weakness, seizures, loss of consciousness, weakness and headaches.  Endo/Heme/Allergies: Negative for polydipsia. Does not bruise/bleed easily.  Psychiatric/Behavioral: Negative for depression, suicidal ideas, hallucinations, memory loss and substance abuse. The patient is not nervous/anxious and does not have insomnia.     Blood pressure 98/63, pulse 71, temperature 97.5 F (36.4 C), temperature source Oral, resp. rate 18, height 5\' 11"  (1.803 m), weight 81.647 kg (180 lb).Body mass index is 25.12 kg/(m^2).  General Appearance: Disheveled  Eye Contact::  Good  Speech:  Clear and Coherent  Volume:  Normal  Mood:  Anxious and Depressed  Affect:  Flat  Thought Process:  Linear  Orientation:  Full (Time, Place, and Person)  Thought Content:  WDL  Suicidal Thoughts:  Yes.  without intent/plan  Homicidal Thoughts:  No  Memory:  Immediate;   Fair   Judgement:  Impaired  Insight:  Lacking  Psychomotor Activity:  Normal  Concentration:  Fair  Recall:  Fair  Akathisia:  No  Handed:  Right  AIMS (if indicated):     Assets:  Communication Skills Desire for Improvement Housing  Sleep:  Number of Hours: 6.5   Current Medications: Current Facility-Administered Medications  Medication Dose Route Frequency Provider Last Rate Last Dose  . acetaminophen (TYLENOL) tablet 650 mg  650 mg Oral Q6H PRN Cleotis Nipper, MD      . albuterol (PROVENTIL HFA;VENTOLIN HFA) 108 (90 BASE) MCG/ACT inhaler 2 puff  2 puff Inhalation Q6H PRN Kerry Hough, PA-C      . alum & mag hydroxide-simeth (MAALOX/MYLANTA) 200-200-20 MG/5ML suspension 30 mL  30 mL Oral Q4H PRN Cleotis Nipper, MD      . buPROPion (WELLBUTRIN XL) 24 hr tablet 150 mg  150 mg Oral Daily Cleotis Nipper, MD   150 mg at 09/21/12 0836  . Dimethyl Fumarate CPDR 240 mg  240 mg Oral BID Verne Spurr, PA-C   240 mg at 09/21/12 0836  . escitalopram (LEXAPRO) tablet 15 mg  15 mg Oral Daily [name], NP   15 mg at 09/21/12 0836  . magnesium hydroxide (MILK OF MAGNESIA) suspension 30 mL  30 mL Oral Daily PRN Cleotis Nipper, MD      . nabumetone (RELAFEN) tablet 500 mg  500 mg Oral BID Cleotis Nipper, MD   500 mg at 09/21/12 4098    Lab Results: No results found for this or any previous visit (from the past 48 hour(s)).  Physical Findings: AIMS: Facial and Oral Movements Muscles of Facial Expression: None,  normal Lips and Perioral Area: None, normal Jaw: None, normal Tongue: None, normal,Extremity Movements Upper (arms, wrists, hands, fingers): None, normal Lower (legs, knees, ankles, toes): None, normal, Trunk Movements Neck, shoulders, hips: None, normal, Overall Severity Severity of abnormal movements (highest score from questions above): None, normal Incapacitation due to abnormal movements: None, normal Patient's awareness of abnormal movements (rate only patient's report): No  Awareness, Dental Status Current problems with teeth and/or dentures?: No Does patient usually wear dentures?: No  CIWA:    COWS:     Treatment Plan Summary: Daily contact with patient to assess and evaluate symptoms and progress in treatment Medication management  Plan: 1. Did discuss with the patient the use of Lithium for his chronic thoughts of suicide and he will discuss it in the AM with the new attending MD.  He had been on it in the past, but did not stay on it long enough to make any difference. 2. Symptoms are worrisome for malingering, and seem to be incongruent with his reports of depression and his affect upon observation when he is unaware he is being obsereved. 3, Would recommend maximizing Lexapro and add Lithium or consider SNRI. Medical Decision Making Problem Points:  Established problem, stable/improving (1) and New problem, with no additional work-up planned (3) Data Points:  Review and summation of old records (2)  I certify that inpatient services furnished can reasonably be expected to improve the patient's condition.  Rona Ravens. Sharia Averitt RPAC 3:10 PM 09/21/2012

## 2012-09-21 NOTE — Progress Notes (Signed)
Adult Psychoeducational Group Note  Date:  09/21/2012 Time:  10:42 PM  Group Topic/Focus:  Goals Group:   The focus of this group is to help patients establish daily goals to achieve during treatment and discuss how the patient can incorporate goal setting into their daily lives to aide in recovery.  Participation Level:  Active  Participation Quality:  Appropriate  Affect:  Appropriate  Cognitive:  Appropriate  Insight: Appropriate  Engagement in Group:  Engaged  Modes of Intervention:  Discussion  Additional Comments:  Pt. Stated that there is nothing good going on even his stay here. Pt also stated that his situation is getting worse and he sees no way its going to get better. He also stated that the therapist said that he may get another diagnosis and it's Bipolar and he doesn't even know what that is.  Terie Purser R 09/21/2012, 10:42 PM

## 2012-09-21 NOTE — B[facility] Group Notes (Signed)
[facility] Group Notes:  (Clinical Social Work)  09/21/2012   3:00-4:00PM  Summary of Progress/Problems:   The main focus of today's process group was to   identify the patient's current support system and decide on other supports that can be put in place.  Four definitions/levels of support were discussed.  An emphasis was placed on using counselor, doctor, therapy groups, 12-step groups, and problem-specific support groups to expand supports. The patient stated that his doctor here is looking at revising his diagnosis from MDD to Bipolar Disorder, and this is confusing for him.  He said his therapist at [facility] is his sole support currently.  He admits that he needs to go to support groups and that he has done better emotionally when he has gone to these.  Type of Therapy:  Process Group  Participation Level:  Active  Participation Quality:  Attentive and Sharing  Affect:  Blunted and Depressed  Cognitive:  Appropriate and Oriented  Insight:  Engaged  Engagement in Therapy:  Engaged  Modes of Intervention:  Education,  Support and [employer], LCSW 09/21/2012, 5:25 PM

## 2012-09-21 NOTE — Progress Notes (Signed)
[facility] Group Notes:  (Nursing/MHT/Case Management/Adjunct)  Date:  09/21/2012  Time:  2:53 AM  Type of Therapy:  Group Therapy  Participation Level:  Minimal  Participation Quality:  Appropriate  Affect:  Depressed and Flat  Cognitive:  Oriented  Insight:  Improving  Engagement in Group:  Developing/Improving  Modes of Intervention:  Socialization and Support  Summary of Progress/Problems: Pt. Stated his energy level was a 4 out of 10. Pt. Stated was not feeling well today and having a tough time with depression.  Pt. Stated what got him admitted was telling his therapist suicide feels right and that was the only way for peace.  Sondra Come 09/21/2012, 2:53 AM

## 2012-09-21 NOTE — Progress Notes (Addendum)
D) Pt has been attending the groups and sitting in the dayroom laughing and joking with his peers. Out of the room more today. Continues to have thoughts of suicide. Rates his depression and hopelessness both at an 8.  A) Given support, reassurance and praise. Encouraged to stay out of his room.  R) Continues to have thoughts of SI. Contracts for safety.

## 2012-09-21 NOTE — Progress Notes (Signed)
Psychoeducational Group Note  Date:  09/21/2012 Time:  1015  Group Topic/Focus:  Making Healthy Choices:   The focus of this group is to help patients identify negative/unhealthy choices they were using prior to admission and identify positive/healthier coping strategies to replace them upon discharge.  Participation Level:  Active  Participation Quality:  Appropriate  Affect:  Appropriate  Cognitive:  Alert  Insight:  Improving  Engagement in Group:  Engaged  Additional Comments:  Paid attention and participated   Antwann Preziosi A 09/21/2012 

## 2012-09-21 NOTE — Progress Notes (Signed)
Psychoeducational Group Note  Psychoeducational Group Note  Date: 09/21/2012 Time:  09/21/2012  Group Topic/Focus:  Gratefulness:  The focus of this group is to help patients identify what two things they are most grateful for in their lives. What helps ground them and to center them on their work to their recovery.  Participation Level:  Active  Participation Quality:  Appropriate  Affect:  Flat  Cognitive:  Alert  Insight:  Improving  Engagement in Group:  Engaged  Additional Comments:  Pt staes that he is most grateful for his son

## 2012-09-22 DIAGNOSIS — R45851 Suicidal ideations: Secondary | ICD-10-CM

## 2012-09-22 DIAGNOSIS — F329 Major depressive disorder, single episode, unspecified: Secondary | ICD-10-CM

## 2012-09-22 MED ORDER — BUPROPION HCL ER (XL) 300 MG PO TB24
300.0000 mg | ORAL_TABLET | Freq: Every day | ORAL | Status: DC
  Administered 2012-09-23 – 2012-09-25 (×3): 300 mg via ORAL
  Filled 2012-09-22 (×5): qty 1
  Filled 2012-09-22: qty 3

## 2012-09-22 NOTE — BHH Group Notes (Addendum)
[facility] LCSW Aftercare Discharge Planning Group Note         09/22/2012 12:20 PM  Participation Quality:  Appropriate   Mood/Affect:  Appropriate and Depressed  Depression Rating:  8  Anxiety Rating:  0  Thoughts of Suicide:  No  Will you contract for safety?   NA  Current AVH:  No  Plan for Discharge/Comments:  Patient endorsing SI but able to contract for safety.  Patient rates hopeless and helplessness at eight also.  He states he is okay with returning to the family care home but does not see his life changing.  Transportation Means: Patient has transportation.   Supports: Patient has a good support system.   [name], [name]

## 2012-09-22 NOTE — Progress Notes (Signed)
Adult Psychoeducational Group Note  Date:  09/22/2012 Time:  11:00AM Group Topic/Focus:  Wellness Toolbox:   The focus of this group is to discuss various aspects of wellness, balancing those aspects and exploring ways to increase the ability to experience wellness.  Patients will create a wellness toolbox for use upon discharge.  Participation Level:  Active  Participation Quality:  Appropriate and Attentive  Affect:  Appropriate  Cognitive:  Alert and Appropriate  Insight: Appropriate  Engagement in Group:  Engaged  Modes of Intervention:  Discussion  Additional Comments:  Pt. Was attentive and appropriate during today's group discussion. Pt. Shared that he didn't score well in the spiritual self care. Pt. Shared that he don't wear the clothes he like. Pt. Stated that he do make healthy choices in eating. Pt stated that he is willing to try some of the items introduced today.   Bing Plume D 09/22/2012, 2:40 PM

## 2012-09-22 NOTE — Progress Notes (Signed)
Pt observed in the dayroom sitting with peers and interacting appropriately.  Pt reports he spoke with the management of the home where he resides and they said if he returns, he would have to move to their other location.  He is upset, because he does not like the other home and wants to stay where he is.  He also says he was told that he may be discharged Tuesday.  Informed pt that no report of discharge was passed to this writer, and that he needed to speak with his case manager about his concerns.  He says he is still having suicidal thoughts, but contracts for safety.  He denies HI/AV.  Support and encouragement offered.  Pt makes his needs known to staff.  Safety maintained with q15 minute checks.

## 2012-09-22 NOTE — Progress Notes (Signed)
Adult Psychoeducational Group Note  Date:  09/22/2012 Time:  8;00PM Group Topic/Focus:  Wrap-Up Group:   The focus of this group is to help patients review their daily goal of treatment and discuss progress on daily workbooks.  Participation Level:  Active  Participation Quality:  Appropriate and Attentive  Affect:  Appropriate  Cognitive:  Alert and Appropriate  Insight: Appropriate  Engagement in Group:  Engaged  Modes of Intervention:  Discussion  Additional Comments:  Pt. Was attentive and appropriate during tonight's group discussion. Pt shared that he was having a ok day until he got a phone call stating that he will moving out of current group home. Pt. Stated that he is going to talk to social worker in morning to discuss other housing issues.   Bing Plume D 09/22/2012, 9:27 PM

## 2012-09-22 NOTE — Progress Notes (Signed)
Recreation Therapy Notes  Date: 06.02.2014 Time: 3:00pm Location: [address] Dayroom      Group Topic/Focus: Leisure Education  Participation Level: Active  Participation Quality: Appropriate  Affect: Euthymic  Cognitive: Appropriate   Additional Comments: Activity: Healthy Eating & Fitness Trivia ; Explanation: Patients were asked to divide themselves into three teams. Teams were asked questions from one of the following five categories: Fruits, Fitness, Veggies, Exercises, Producer, television/film/video. Patient teams were awarded one point for each correct answer.   Patient actively participated in group activity. Patient worked well with his team mates. Patient listened to wrap up discussion about the benefits of exercise to whole wellness.   Marykay Lex Dahmir Epperly, LRT/CTRS  Jearl Klinefelter 09/22/2012 4:46 PM

## 2012-09-22 NOTE — [facility] Group Notes (Signed)
[facility] LCSW Group Therapy  09/22/2012 3:33 PM  Type of Therapy:  Group Therapy  Participation Level:  Active  Participation Quality:  Appropriate and Attentive  Affect:  Appropriate and Depressed  Cognitive:  Appropriate  Insight:  Engaged  Engagement in Therapy:  Engaged  Modes of Intervention:  Discussion, Education, Exploration, Problem-Solving, Rapport Building, Support  Summary of Progress/Problems:  Patient shared the obstacle he has to overcome is failure.  He shared he is his own worst enemy.  Patient was able to state the success he has had in life is his 33 years old son.  [name] 09/22/2012, 3:33 PM

## 2012-09-22 NOTE — Progress Notes (Signed)
Patient ID: [name], male   DOB: Oct 10, 1955, 57 y.o.   MRN: [id number] D)  Remains sad, flat, depressed, doesn't see his situation improving with time.  Attended group this evening, had no hs meds scheduled, went to bed after his snack and was sleeping when my rounds were made.   A)  Will continue to monitor for safety, continue POC R)  Safety maintained.

## 2012-09-22 NOTE — Progress Notes (Signed)
D:  Per pt self inventory pt reports sleeping well, appetite good, energy level normal, ability to pay attention good, rates depression at an 8 out of 10 and hopelessness at an 8 out of 10, endorses SI contracts for safety, denies HI/AVH at this time, denies pain.     A:  Emotional support provided, Encouraged pt to continue with treatment plan and attend all group activities, q15 min checks maintained for safety.  R:  Pt is feels as though he is going "down hill", doesn't see his life improving because of the MS, pt is calm cooperative, attends groups.

## 2012-09-22 NOTE — Progress Notes (Signed)
Patient ID: [name], male   DOB: [birth year], 57 y.o.   MRN: [id number] [facility] MD Progress Note  09/22/2012 2:37 PM [name]  MRN:  [id number] Subjective:  Patient reports that his depression is at eight today. He does not report feeling any better. Patient states "I don't know what to do differently. The only way I know to find peace is to die." When asked when in his life patient felt better stated "I guess when I was married and raising our son. But I have no interest in dating. In fact I'm not interested in anything that I used to like watching TV or listening to music." The patient was encouraged to thinking about healthy ways to improve his life and not stay focused on death being the only solution to his problems. The patient admitted that if his life were more full than perhaps he would feel better about his future. Patient encouraged to think of constructive approaches to improve his life situation. Deklen remained very negative and discredited suggestions that were made by Clinical research associate.   ADL's:  Intact  Sleep: Fair  Appetite:  Fair  Suicidal Ideation:  Daily "but not usually when I'm in here." Homicidal Ideation:  Denies AEB (as evidenced by):  Psychiatric Specialty Exam: Review of Systems  Constitutional: Negative.   HENT: Negative.   Eyes: Negative.   Respiratory: Negative.  Negative for cough.   Cardiovascular: Negative.  Negative for palpitations.  Gastrointestinal: Negative.   Genitourinary: Negative.   Musculoskeletal: Positive for myalgias.  Skin: Negative.   Psychiatric/Behavioral: Positive for depression and suicidal ideas. Negative for hallucinations, memory loss and substance abuse. The patient is not nervous/anxious and does not have insomnia.     Blood pressure 102/68, pulse 62, temperature 97.8 F (36.6 C), temperature source Oral, resp. rate 18, height 5\' 11"  (1.803 m), weight 81.647 kg (180 lb).Body mass index is 25.12 kg/(m^2).  General Appearance: Disheveled   Eye Contact::  Good  Speech:  Clear and Coherent  Volume:  Normal  Mood:  Anxious and Depressed  Affect:  Flat  Thought Process:  Linear  Orientation:  Full (Time, Place, and Person)  Thought Content:  WDL  Suicidal Thoughts:  Yes.  without intent/plan  Homicidal Thoughts:  No  Memory:  Immediate;   Fair  Judgement:  Impaired  Insight:  Lacking  Psychomotor Activity:  Normal  Concentration:  Fair  Recall:  Fair  Akathisia:  No  Handed:  Right  AIMS (if indicated):     Assets:  Communication Skills Desire for Improvement Housing  Sleep:  Number of Hours: 6.5   Current Medications: Current Facility-Administered Medications  Medication Dose Route Frequency Provider Last Rate Last Dose  . acetaminophen (TYLENOL) tablet 650 mg  650 mg Oral Q6H PRN Cleotis Nipper, MD      . albuterol (PROVENTIL HFA;VENTOLIN HFA) 108 (90 BASE) MCG/ACT inhaler 2 puff  2 puff Inhalation Q6H PRN Kerry Hough, PA-C      . alum & mag hydroxide-simeth (MAALOX/MYLANTA) 200-200-20 MG/5ML suspension 30 mL  30 mL Oral Q4H PRN Cleotis Nipper, MD      . Melene Muller ON 09/23/2012] buPROPion (WELLBUTRIN XL) 24 hr tablet 300 mg  300 mg Oral Daily Fransisca Kaufmann, NP      . Dimethyl Fumarate CPDR 240 mg  240 mg Oral BID Verne Spurr, PA-C   240 mg at 09/22/12 9629  . escitalopram (LEXAPRO) tablet 15 mg  15 mg Oral Daily [name], NP  15 mg at 09/22/12 0833  . magnesium hydroxide (MILK OF MAGNESIA) suspension 30 mL  30 mL Oral Daily PRN Cleotis Nipper, MD      . nabumetone (RELAFEN) tablet 500 mg  500 mg Oral BID Cleotis Nipper, MD   500 mg at 09/22/12 4098    Lab Results: No results found for this or any previous visit (from the past 48 hour(s)).  Physical Findings: AIMS: Facial and Oral Movements Muscles of Facial Expression: None, normal Lips and Perioral Area: None, normal Jaw: None, normal Tongue: None, normal,Extremity Movements Upper (arms, wrists, hands, fingers): None, normal Lower (legs, knees, ankles,  toes): None, normal, Trunk Movements Neck, shoulders, hips: None, normal, Overall Severity Severity of abnormal movements (highest score from questions above): None, normal Incapacitation due to abnormal movements: None, normal Patient's awareness of abnormal movements (rate only patient's report): No Awareness, Dental Status Current problems with teeth and/or dentures?: No Does patient usually wear dentures?: No  CIWA:    COWS:     Treatment Plan Summary: Daily contact with patient to assess and evaluate symptoms and progress in treatment Medication management  Plan: Continue crisis management and stabilization.  Medication management: Increase Wellbutrin XR to 300 mg to target depressive symptoms.  Encouraged patient to attend groups and participate in group counseling sessions and activities. Assist patient in the development of healthy coping skills and hobbies to utilize after discharge.  Address health issues: Vitals reviewed and stable.  Discharge plan in progress.  Continue current treatment plan.   Medical Decision Making Problem Points:  Established problem, stable/improving (1) and New problem, with no additional work-up planned (3) Data Points:  Review and summation of old records (2)  I certify that inpatient services furnished can reasonably be expected to improve the patient's condition.  Fransisca Kaufmann NP-C 2:37 PM 09/22/2012

## 2012-09-23 MED ORDER — AMPHETAMINE-DEXTROAMPHET ER 10 MG PO CP24
10.0000 mg | ORAL_CAPSULE | Freq: Every day | ORAL | Status: DC
  Administered 2012-09-24 – 2012-09-25 (×2): 10 mg via ORAL
  Filled 2012-09-23 (×2): qty 1

## 2012-09-23 MED ORDER — ESCITALOPRAM OXALATE 20 MG PO TABS
20.0000 mg | ORAL_TABLET | Freq: Every day | ORAL | Status: DC
  Administered 2012-09-24 – 2012-09-25 (×2): 20 mg via ORAL
  Filled 2012-09-23: qty 1
  Filled 2012-09-23: qty 3
  Filled 2012-09-23 (×2): qty 1

## 2012-09-23 NOTE — Plan of Care (Signed)
Problem: Ineffective individual coping Goal: STG: Patient will participate in after care plan Patient is attending group and discussing issues. He will return to [facility] and follow up with [facility].  Horace Porteous Waverly Chavarria, LCSW 09/18/2012  Outcome: Completed/Met Date Met:  09/23/12 Patient is attending group and discussing problems related to his admission.  His follow up is scheduled with [facility].  Horace Porteous Ameria Sanjurjo, LCSW 09/23/2012

## 2012-09-23 NOTE — Progress Notes (Signed)
Recreation Therapy Notes  Date: 06.03.2014 Time: 2:45pm Location: 500 Hall Dayroom      Group Topic/Focus: Musician (AAA/T)  Participation Level: Active  Participation Quality: Appropriate  Affect: Euthymic  Cognitive: Appropriate  Additional Comments: 06.03.2014 Session =  AAA  ; Dog Team = [facility] & handler  Patient pet and visited with [place]. Patient shared stories with peers about pets he has had in the past. Patient spoke lovingly about these pets. Patient smiled periodically throughout session, patient peers brought attention to patient smile. Patient stated "What? I like dogs" in a playful manner. Patient interacted appropriately with peer, LRT and dog team.   Jearl Klinefelter, LRT/CTRS  Gweneth Dimitri L 09/23/2012 4:35 PM

## 2012-09-23 NOTE — Progress Notes (Signed)
D: Patient denies HI and A/V hallucinations and admits to some thoughts of SI; patient reports sleep is well; reports appetite is good ; reports energy level is normal ; reports ability to pay attention is ; rates depression as 10/10; rates hopelessness 10/10; rates anxiety as 0/10;   A: Monitored q 15 minutes; patient encouraged to attend groups; patient educated about medications; patient given medications per physician orders; patient encouraged to express feelings and/or concerns  R: Patient is blunted and very short with his words; does not forward much information; patient is irritable but minimal; patient's interaction with staff and peers is isolative; patient was able to set goal to talk with staff 1:1 when having feelings of SI; patient is taking medications as prescribed and tolerating medications; patient has not been attending all groups today

## 2012-09-23 NOTE — [facility] Group Notes (Signed)
[facility] LCSW Group Therapy      Feelings About Diagnosis 1:15 - 2:30 PM       09/23/2012 1:20 PM  Type of Therapy:  Group Therapy  Participation Level:  Active  Participation Quality:  Appropriate and Attentive  Affect:  Appropriate and Depressed  Cognitive:  Appropriate  Insight:  Engaged  Engagement in Therapy:  Engaged  Modes of Intervention:  Discussion, Education, Exploration, Problem-Solving, Rapport Building, Support  Summary of Progress/Problems:  Patient shared he has major depression and no longer knows who he is or has any hope.  Patient was challenged to look for something in life that can give him hope and his life meaning.   Patient began to talk about his son who will son be 36.  Patient was applauded by group for the excited and joy he showed as he talked about his son.  [name] 09/23/2012, 1:20 PM

## 2012-09-23 NOTE — Progress Notes (Signed)
Pt met with other pt's in 500 day room following recreation therapy group.   Pt's spoke with chaplain about frustrations with conflict generated by a fellow pt.  Spoke about distraction from focusing on themselves and anxiety level.  Pt's spoke about what they were working on at [facility] and ways they have been able to remain focused.   Another pt wished to pray with the group and [name] wanted to participate.   Stated he would like to pray for inner peace and spoke with the rest of the group about this being a common need.   Spoke about frustrations in returning to [facility] and hope that he would find something that helps him cope with depression long-term.  Group provided support around [name]'s strength to come to [facility] and seek out help even with history of not feeling that this help was not lasting.   Group encouraged [name] and was receptive to support.

## 2012-09-23 NOTE — Progress Notes (Signed)
Adult Psychoeducational Group Note  Date:  09/23/2012 Time:  9:44 PM  Group Topic/Focus:  Wrap-Up Group:   The focus of this group is to help patients review their daily goal of treatment and discuss progress on daily workbooks.  Participation Level:  Minimal  Participation Quality:  Appropriate  Affect:  Blunted and Flat  Cognitive:  Appropriate  Insight: Limited  Engagement in Group:  Limited  Modes of Intervention:  Exploration and Support  Additional Comments:  Pt stated he never makes goals although he stated that he just tries to "make it through the day".   Humberto Seals Monique 09/23/2012, 9:44 PM

## 2012-09-23 NOTE — Progress Notes (Signed)
Pt reports he is doing about the same as yesterday.  He is still frustrated about the conversation he had with the director of [facility] where he resides.  He says he does not want to move to the other location.  He says other that that, he is doing ok.  He voices no other needs or concerns.  He say his appetite is ok.  He still has passive thoughts of suicide, but can contract for safety.  He denies HI/AV.  Pt makes his needs known to staff.  Support and encouragement offered.  Safety maintained with q15 minute checks.

## 2012-09-23 NOTE — Progress Notes (Signed)
[name] MD Progress Note  09/23/2012 12:25 PM [name]  MRN:  [id number]  Subjective:  Patient is seen and chart reviewed. He continues to have symptoms of depression and decreased psychomotor retardation. He stated that he has no help from his medications. He was taken celexa and ability which were helpful but stopped because celexa 80 mg has heart problems and abilify is too expensive and medicare does not pay for it. is at eight today. Patient has been staying in group home [facility] x 3 1/2 years and attending Day treatment program. His therapist referred him for in patient treatment. He has MS and needs medication from patient assistant program from his neurologist office. He wonders about when he can be discharged. He can not contract for safety even though he has no active suicidal intentions and plans. He has been compliant with his current medication wellbutrin and lexapro with limited relief. He agree with higher dose of lexapro. He may needs adderall for his psychomotor retardation and increase focus due to MS.   ADL's:  Intact  Sleep: Fair  Appetite:  Fair  Suicidal Ideation:  Daily "but not usually when I'm in here." Homicidal Ideation:  Denies AEB (as evidenced by):  Psychiatric Specialty Exam: Review of Systems  Constitutional: Negative.   HENT: Negative.   Eyes: Negative.   Respiratory: Negative.  Negative for cough.   Cardiovascular: Negative.  Negative for palpitations.  Gastrointestinal: Negative.   Genitourinary: Negative.   Musculoskeletal: Positive for myalgias.  Skin: Negative.   Psychiatric/Behavioral: Positive for depression and suicidal ideas. Negative for hallucinations, memory loss and substance abuse. The patient is not nervous/anxious and does not have insomnia.     Blood pressure 93/63, pulse 103, temperature 97.4 F (36.3 C), temperature source Oral, resp. rate 18, height 5\' 11"  (1.803 m), weight 180 lb (81.647 kg).Body mass index is 25.12  kg/(m^2).  General Appearance: Disheveled  Eye Contact::  Good  Speech:  Clear and Coherent  Volume:  Normal  Mood:  Anxious and Depressed  Affect:  Flat  Thought Process:  Linear  Orientation:  Full (Time, Place, and Person)  Thought Content:  WDL  Suicidal Thoughts:  Yes.  without intent/plan  Homicidal Thoughts:  No  Memory:  Immediate;   Fair  Judgement:  Impaired  Insight:  Lacking  Psychomotor Activity:  Normal  Concentration:  Fair  Recall:  Fair  Akathisia:  No  Handed:  Right  AIMS (if indicated):     Assets:  Communication Skills Desire for Improvement Housing  Sleep:  Number of Hours: 6.5   Current Medications: Current Facility-Administered Medications  Medication Dose Route Frequency Provider Last Rate Last Dose  . acetaminophen (TYLENOL) tablet 650 mg  650 mg Oral Q6H PRN Cleotis Nipper, MD      . albuterol (PROVENTIL HFA;VENTOLIN HFA) 108 (90 BASE) MCG/ACT inhaler 2 puff  2 puff Inhalation Q6H PRN Kerry Hough, PA-C      . alum & mag hydroxide-simeth (MAALOX/MYLANTA) 200-200-20 MG/5ML suspension 30 mL  30 mL Oral Q4H PRN Cleotis Nipper, MD      . buPROPion (WELLBUTRIN XL) 24 hr tablet 300 mg  300 mg Oral Daily Fransisca Kaufmann, NP   300 mg at 09/23/12 0753  . Dimethyl Fumarate CPDR 240 mg  240 mg Oral BID Verne Spurr, PA-C   240 mg at 09/23/12 4098  . [START ON 09/24/2012] escitalopram (LEXAPRO) tablet 20 mg  20 mg Oral Daily Nehemiah Settle, MD      .  magnesium hydroxide (MILK OF MAGNESIA) suspension 30 mL  30 mL Oral Daily PRN Cleotis Nipper, MD      . nabumetone (RELAFEN) tablet 500 mg  500 mg Oral BID Cleotis Nipper, MD   500 mg at 09/23/12 1610    Lab Results: No results found for this or any previous visit (from the past 48 hour(s)).  Physical Findings: AIMS: Facial and Oral Movements Muscles of Facial Expression: None, normal Lips and Perioral Area: None, normal Jaw: None, normal Tongue: None, normal,Extremity Movements Upper (arms, wrists,  hands, fingers): None, normal Lower (legs, knees, ankles, toes): None, normal, Trunk Movements Neck, shoulders, hips: None, normal, Overall Severity Severity of abnormal movements (highest score from questions above): None, normal Incapacitation due to abnormal movements: None, normal Patient's awareness of abnormal movements (rate only patient's report): No Awareness, Dental Status Current problems with teeth and/or dentures?: No Does patient usually wear dentures?: No  CIWA:    COWS:     Treatment Plan Summary: Daily contact with patient to assess and evaluate symptoms and progress in treatment Medication management  Plan: 1. Continue crisis management and stabilization.  2. Medication management: continue Wellbutrin XR to 300 mg and increase Lexapro 20 mg to target depressive symptoms. Start Adderall XR 10 mg PO Qam 3. Encouraged patient to attend groups and participate in group counseling sessions and activities.  4. Assist patient in the development of healthy coping skills and hobbies to utilize after discharge.  5. Address health issues: Vitals reviewed and stable.  6. Discharge plan in progress.     Medical Decision Making Problem Points:  Established problem, stable/improving (1) and New problem, with no additional work-up planned (3) Data Points:  Review and summation of old records (2)  I certify that inpatient services furnished can reasonably be expected to improve the patient's condition.     Ura Yingling,JANARDHAHA R. 09/23/2012 12:36 PM

## 2012-09-23 NOTE — Progress Notes (Signed)
Grief & Loss Group   The group discussed significant losses through the use of a creative photograph activity. The activity involved the selection of a photograph that connected with members' grief and loss experiences and emotions.  Pt initially did not want to share because he did not want to bring down the rest of the group. Pt only shared after some warm invitations from other group members. Pt was in despair during the group expressing that he had been here 20 times and that it had never made his life better and he questioned the purpose of coming back here. The pt said repeatedly, "I want to end it all" and "I don't see the use." Pt was not open to considering other group members invitation for positive perspectives and reframing of his negative emotions.   Sherol Dade Counseling Intern Haroldine Laws

## 2012-09-24 NOTE — [facility] Group Notes (Signed)
[facility] LCSW Aftercare Discharge Planning Group Note      09/24/2012 9:42 AM  Participation Quality:  Appropriate   Mood/Affect:  Appropriate and Depressed  Depression Rating: 6-7  Anxiety Rating:  0  Thoughts of Suicide:  No  Will you contract for safety?   NA  Current AVH:  No  Plan for Discharge/Comments:  Patient reports feeling half way decent today but not ready to discharge home.  Transportation Means: Patient has transportation.   Supports: Patient has a good support system.   Cindel Daugherty, Joesph July

## 2012-09-24 NOTE — [facility] Group Notes (Signed)
[facility] LCSW Group Therapy      Emotional Regulation 1:15 - 2:30 PM       09/24/2012 11:18 AM  Type of Therapy:  Group Therapy  Participation Level:  Active  Participation Quality:  Appropriate and Attentive  Affect:  Appropriate and Depressed  Cognitive:  Appropriate  Insight:  Engaged  Engagement in Therapy:  Engaged  Modes of Intervention:  Discussion, Education, Exploration, Problem-Solving, Rapport Building, Support  Summary of Progress/Problems:  Patient shared he used to have a problem with anger.  He shared he accepts that most things are not worth and argument and walks away.  [name] 09/24/2012, 11:18 AM

## 2012-09-24 NOTE — Progress Notes (Signed)
Adult Psychoeducational Group Note  Date:  09/24/2012 Time:  8:00PM Group Topic/Focus:  Wrap-Up Group:   The focus of this group is to help patients review their daily goal of treatment and discuss progress on daily workbooks.  Participation Level:  Active  Participation Quality:  Appropriate and Attentive  Affect:  Appropriate  Cognitive:  Alert and Appropriate  Insight: Appropriate  Engagement in Group:  Engaged  Modes of Intervention:  Discussion  Additional Comments:  Pt. Was attentive and appropriate during tonight's group discussion. Pt. Was able to share with group that he is having a better day today than yesterday. Pt. Stated that he talked with the doctor today and explained to him how he has been feeling. Pt shared that there was a change in medication and can tell the difference already.   Bing Plume D 09/24/2012, 9:42 PM

## 2012-09-24 NOTE — Progress Notes (Signed)
Chaplain follow up for support in 500 day room.  Pt spoke with chaplain about having to move to different home.  He expresses distress, but speaks about ways he can manage change.

## 2012-09-24 NOTE — Progress Notes (Signed)
D: Patient denies HI and A/V hallucinations and admits to thoughts of SI; patient reports sleep is well; reports appetite is good ; reports energy level is normal ; reports ability to pay attention is good; rates depression as 6/10; rates hopelessness 6/10; rates anxiety as 0/10; reports " I am decent"  A: Monitored q 15 minutes; patient encouraged to attend groups; patient educated about medications; patient given medications per physician orders; patient encouraged to express feelings and/or concerns  R: Patient is quiet and cooperative; patient is guarded; patient's interaction with staff and peers is very minimal; patient was able to set goal to talk with staff 1:1 when having feelings of SI; patient is taking medications as prescribed and tolerating medications; patient is attending all groups

## 2012-09-24 NOTE — Tx Team (Signed)
Interdisciplinary Treatment Plan Update   Date Reviewed:  09/24/2012  Time Reviewed:  9:44 AM  Progress in Treatment:   Attending groups: Yes Participating in groups: Yes Taking medication as prescribed: Yes  Tolerating medication: Yes Family/Significant other contact made: No, but contact to be made with family care home. Patient understands diagnosis: Yes  Discussing patient identified problems/goals with staff: Yes Medical problems stabilized or resolved: Yes Denies suicidal/homicidal ideation: Yes Patient has not harmed self or others: Yes  For review of initial/current patient goals, please see plan of care.  Estimated Length of Stay:  1-2 days  Reasons for Continued Hospitalization:  Anxiety Depression Medication stabilization  New Problems/Goals identified:    Discharge Plan or Barriers:  Patient will return to [facility]  Additional Comments: Patient is not endorsing SI/HI and rates depression at six/seven.  Attendees:   Patient:  09/24/2012 9:44 AM   Signature: Mervyn Gay, MD 09/24/2012 9:44 AM  Signature: Leighton Parody, RN 09/24/2012 9:44 AM  Signature:  Fransisca Kaufmann, [facility] 09/24/2012 9:44 AM  Signature: Chinita Greenland, RN 09/24/2012 9:44 AM  Signature:   09/24/2012 9:44 AM  Signature:  Juline Patch, LCSW 09/24/2012 9:44 AM  Signature:  09/24/2012 9:44 AM  Signature:  09/24/2012 9:44 AM  Signature:  09/24/2012 9:44 AM  Signature:    Signature:    Signature:      Scribe for Treatment Team:   Juline Patch,  09/24/2012 9:44 AM

## 2012-09-24 NOTE — Progress Notes (Signed)
Recreation Therapy Notes  Date: 06.04.2014  Time: 3:00pm  Location: [address] Dayroom   Group Topic/Focus: Geophysicist/field seismologist   Participation Level:  Active   Participation Quality:  Appropriate   Affect:  Euthymic   Cognitive:  Appropriate   Additional Comments: Activity: Adapted Toilet Paper Game; Explanation: Patients were instructed to take as many squares of toilet paper as they will need for the remainder of the afternoon. Patients were then instructed they were to identify that many number of ways they personally develop or grow. Due to concerned looks on patient faces, as well as lack of motivation of patients to participate in activity LRT adapted activity for group to come up with a list of 18 ways they can personally develop.   Patient participated in group activity. Patient offered suggestions and activities to group list. Patient with peers successfully identified 18 ways they can personally develop post d/c. Patient contributed to wrap up discussion about the importance of personal development.   [name] Zyionna Pesce, LRT/CTRS  Jearl Klinefelter 09/24/2012 4:44 PM

## 2012-09-24 NOTE — Progress Notes (Signed)
Pt observed in the dayroom watching TV.  He is sitting with other patients, but stays mostly to himself.  He says he is doing a little better and has been going to groups.  He feels the Adderall is helping.  He says is ok with the move at the nursing home.  He says he is still having passive SI, but contracts for safety. Pt denies HI/AV.  Pt makes his needs known to staff.  Support and encouragement offered.  Safety maintained with q15 minute checks.

## 2012-09-24 NOTE — Progress Notes (Signed)
Patient ID: [name], male   DOB: 09-Jun-1955, 57 y.o.   MRN: [id number] [facility] MD Progress Note  09/24/2012 2:11 PM [name]  MRN:  [id number]  Subjective:  Patient has stated that he has been feeling better today and he feels that he can manage out side the hospital and looking to go back to the rest home.  He has less smptoms of depression and bright affect. Patient has been a resident of [facility] x 3 1/2 years and attending Day treatment program. His therapist Loel Ro, referred him for in patient treatment. He has MS and needs medication from patient assistant program from his neurologist office and he has scheduled office visit soon. He can contract for safety in hospital today. He has denied suicidal intentions and plans. He may continue his current medication which seems to be helping his symptoms.   ADL's:  Intact  Sleep: Fair  Appetite:  Fair  Suicidal Ideation:  He denied but continues to be ambivalent about his thoughts Homicidal Ideation:  Denies AEB (as evidenced by):  Psychiatric Specialty Exam: Review of Systems  Constitutional: Negative.   HENT: Negative.   Eyes: Negative.   Respiratory: Negative.  Negative for cough.   Cardiovascular: Negative.  Negative for palpitations.  Gastrointestinal: Negative.   Genitourinary: Negative.   Musculoskeletal: Positive for myalgias.  Skin: Negative.   Psychiatric/Behavioral: Positive for depression and suicidal ideas. Negative for hallucinations, memory loss and substance abuse. The patient is not nervous/anxious and does not have insomnia.     Blood pressure 104/65, pulse 79, temperature 97.6 F (36.4 C), temperature source Oral, resp. rate 16, height 5\' 11"  (1.803 m), weight 180 lb (81.647 kg).Body mass index is 25.12 kg/(m^2).  General Appearance: Disheveled  Eye Contact::  Good  Speech:  Clear and Coherent  Volume:  Normal  Mood:  Anxious and Depressed  Affect:  Flat  Thought Process:  Linear  Orientation:   Full (Time, Place, and Person)  Thought Content:  WDL  Suicidal Thoughts:  Yes.  without intent/plan  Homicidal Thoughts:  No  Memory:  Immediate;   Fair  Judgement:  Impaired  Insight:  Lacking  Psychomotor Activity:  Normal  Concentration:  Fair  Recall:  Fair  Akathisia:  No  Handed:  Right  AIMS (if indicated):     Assets:  Communication Skills Desire for Improvement Housing  Sleep:  Number of Hours: 6.5   Current Medications: Current Facility-Administered Medications  Medication Dose Route Frequency Provider Last Rate Last Dose  . acetaminophen (TYLENOL) tablet 650 mg  650 mg Oral Q6H PRN Cleotis Nipper, MD      . albuterol (PROVENTIL HFA;VENTOLIN HFA) 108 (90 BASE) MCG/ACT inhaler 2 puff  2 puff Inhalation Q6H PRN Kerry Hough, PA-C      . alum & mag hydroxide-simeth (MAALOX/MYLANTA) 200-200-20 MG/5ML suspension 30 mL  30 mL Oral Q4H PRN Cleotis Nipper, MD      . amphetamine-dextroamphetamine (ADDERALL XR) 24 hr capsule 10 mg  10 mg Oral Daily Nehemiah Settle, MD   10 mg at 09/24/12 0802  . buPROPion (WELLBUTRIN XL) 24 hr tablet 300 mg  300 mg Oral Daily Fransisca Kaufmann, NP   300 mg at 09/24/12 0802  . Dimethyl Fumarate CPDR 240 mg  240 mg Oral BID Verne Spurr, PA-C   240 mg at 09/24/12 0802  . escitalopram (LEXAPRO) tablet 20 mg  20 mg Oral Daily Nehemiah Settle, MD   20  mg at 09/24/12 0802  . magnesium hydroxide (MILK OF MAGNESIA) suspension 30 mL  30 mL Oral Daily PRN Cleotis Nipper, MD      . nabumetone (RELAFEN) tablet 500 mg  500 mg Oral BID Cleotis Nipper, MD   500 mg at 09/24/12 0802    Lab Results: No results found for this or any previous visit (from the past 48 hour(s)).  Physical Findings: AIMS: Facial and Oral Movements Muscles of Facial Expression: None, normal Lips and Perioral Area: None, normal Jaw: None, normal Tongue: None, normal,Extremity Movements Upper (arms, wrists, hands, fingers): None, normal Lower (legs, knees, ankles, toes):  None, normal, Trunk Movements Neck, shoulders, hips: None, normal, Overall Severity Severity of abnormal movements (highest score from questions above): None, normal Incapacitation due to abnormal movements: None, normal Patient's awareness of abnormal movements (rate only patient's report): No Awareness, Dental Status Current problems with teeth and/or dentures?: No Does patient usually wear dentures?: No  CIWA:    COWS:     Treatment Plan Summary: Daily contact with patient to assess and evaluate symptoms and progress in treatment Medication management  Plan: 1. Continue crisis management and stabilization.  2. Medication management:  Continue Wellbutrin XR to 300 mg   Continue Lexapro 20 mg to target depressive symptoms.   Continue Adderall XR 10 mg PO Qam 3. Encouraged patient to attend groups counseling sessions and activities.  4. Assist development of healthy coping skills and hobbies to utilize after discharge.  5. Address health issues: Vitals reviewed and stable.  6. Discharge plan in progress and patient needs a referral to ACT team for prevention of his mental health relapses.     Medical Decision Making Problem Points:  Established problem, stable/improving (1) and New problem, with no additional work-up planned (3) Data Points:  Review and summation of old records (2)  I certify that inpatient services furnished can reasonably be expected to improve the patient's condition.     Allyanna Appleman,JANARDHAHA R. 09/24/2012 2:11 PM

## 2012-09-25 DIAGNOSIS — F341 Dysthymic disorder: Secondary | ICD-10-CM

## 2012-09-25 MED ORDER — BUPROPION HCL ER (XL) 300 MG PO TB24: 300.0000 mg | ORAL_TABLET | Freq: Every day | ORAL | Status: DC

## 2012-09-25 MED ORDER — ALBUTEROL SULFATE HFA 108 (90 BASE) MCG/ACT IN AERS: 2.0000 | INHALATION_SPRAY | Freq: Four times a day (QID) | RESPIRATORY_TRACT | Status: DC

## 2012-09-25 MED ORDER — NABUMETONE 500 MG PO TABS: 500.0000 mg | ORAL_TABLET | Freq: Two times a day (BID) | ORAL | Status: DC

## 2012-09-25 MED ORDER — AMPHETAMINE-DEXTROAMPHET ER 10 MG PO CP24: 10.0000 mg | ORAL_CAPSULE | Freq: Every day | ORAL | Status: DC

## 2012-09-25 MED ORDER — ESCITALOPRAM OXALATE 20 MG PO TABS: 20.0000 mg | ORAL_TABLET | Freq: Every day | ORAL | Status: DC

## 2012-09-25 MED ORDER — DIMETHYL FUMARATE 240 MG PO CPDR: 240.0000 mg | DELAYED_RELEASE_CAPSULE | Freq: Two times a day (BID) | ORAL | Status: DC

## 2012-09-25 NOTE — Progress Notes (Signed)
[facility] Adult Case Management Discharge Plan :  Will you be returning to the same living situation after discharge: Yes,  Patient to return to [facility] At discharge, do you have transportation home?:Yes,  Rest Home staff will transport patient home. Do you have the ability to pay for your medications:Yes,  Patient is able to obtain medications.  Release of information consent forms completed and in the chart;  Patient's signature needed at discharge.  Patient to Follow up at: Follow-up Information   Follow up with [facility] On 09/26/2012. (Please go to [facility] walk in clinic on Friday, September 26, 2012 or any weekday between 8AM-3PM for medication managment..A referral has been made to [facility]'s ACT TEAM for additonal services.  Someone from the Teams will contact you.)    Contact information:   [address] [place], [place]   [address]  [phone]      Patient denies SI/HI:   Patient no longer endorsing SI/HI or other thoughts of self harm.     Safety Planning and Suicide Prevention discussed:.Reviewed with all patients during discharge planning group   [name], [name] July 09/25/2012, 10:35 AM

## 2012-09-25 NOTE — Plan of Care (Signed)
Problem: Alteration in mood Goal: LTG-Pt's behavior demonstrates decreased signs of depression Patient is rating depression at nine. He is attending groups and discussing problems.  Horace Porteous Brownie Gockel, LCSW 09/18/2012  Outcome: Completed/Met Date Met:  09/25/12 Patient is rating depression at five.  He has attended groups and talked about concerns.    Horace Porteous Jermichael Belmares, LCSW 09/25/2012

## 2012-09-25 NOTE — Progress Notes (Signed)
D/C instructions/meds/follow-up appointments reviewed, pt verbalized understanding, pt's belongings returned to pt, samples given, FL2 sent with pt per LCSW, pt's home medication returned unopened.  Pt stated that he does not want to go to his f/u appointment.  Explained to pt the importance of keeping with the f/u treatment plan and for medication managemnt.  Pt verbalized understanding and stated that he may go but is not 100% sure.

## 2012-09-25 NOTE — BHH Suicide Risk Assessment (Signed)
[facility] INPATIENT:  Family/Significant Other Suicide Prevention Education  Suicide Prevention Education:  Patient Discharged to Other Healthcare Facility:  Suicide Prevention Education Not Provided: {PT. DISCHARGED TO OTHER HEALTHCARE FACILITY:SUICIDE PREVENTION EDUCATION NOT PROVIDED (CHL):  The patient is discharging to another healthcare facility for continuation of treatment.  The patient's medical information, including suicide ideations and risk factors, are a part of the medical information shared with the receiving healthcare facility.  Patient returning to [facility].  [name] 09/25/2012, 10:39 AM

## 2012-09-25 NOTE — BHH Group Notes (Signed)
Adult Psychoeducational Group Note  Date:  09/25/2012 Time:  10:00am  Group Topic/Focus: Therapeutic Activity - Swing Man Self Esteem Action Plan:   The focus of this group is to help patients create a plan to continue to build self-esteem after discharge.  Participation Level:  Active  Participation Quality:  Appropriate  Affect:  Appropriate  Cognitive:  Appropriate  Insight: Appropriate  Engagement in Group:  Engaged  Modes of Intervention:  Activity  Additional Comments:  Daylen answered questions and was fully engaged during group.  Caroll Rancher A 09/25/2012, 11:39 AM

## 2012-09-25 NOTE — Clinical Social Work Note (Signed)
Writer faxed referred to [facility] for [employer]. Call from June with ACT TEAM advising they do not work with patient's insurance or have [employer] funding for ACT.  Referral faxed to [facility].
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
## 2012-09-25 NOTE — Discharge Summary (Signed)
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 Mental disorder   . Depression   . Neuromuscular disorder   . Multiple sclerosis, relapsing-remitting   . MS (multiple sclerosis)    AXIS IV:  economic problems, occupational problems, other psychosocial or environmental problems, problems related to social environment and problems with primary support group AXIS V:  61-70 mild symptoms  Level of Care:  OP  Hospital Course:  [name] is an 57 y.o. male that presented with [employer] [facility] after they were called out to the [facility] program in [place]. According to pt, he admitted to his therapist, Precious Reel, plan and intent to harm himself by purchasing pills across the street from a hotel he would go to and overdose. Pt has attempted numerous times and has suicidal ideation "almost always unless i am on my medications like I should be, which I cannot afford." Pt discontinued his Abilify after being discharged from [facility] several weeks ago because of the cost. Pt reports "life" is what is stressing him out to the point of wanting to end his life. Pt has a lengthy history of depression with little to no success with previous treatment except briefly when leaving the hospital. Pt denies HI or any active psychosis. Pt denies current substance abuse and reports that his last drink was over a month ago. Pt is not currently able to  contract for safety and will need inpatient care to maintain safety.      The duration of stay was ten days. The patient has had several admissions to [facility] so far this year.The patient was seen and evaluated by the Treatment team consisting of Psychiatrist, NP-C, RN, Case Manager, and Therapist for evaluation and treatment plan with goal of stabilization upon discharge. The patient's physical and mental health problems were identified and treated appropriately. [name] endorsed high levels of depression during his stay and his medications were adjusted accordingly. The patient expressed hopelessness about his future and was resistant to learning healthy coping skills. Patient stated during his admission that "That the only way I can find peace is to die." The patient has chronic SI and has had suicide attempts in the past. On one occasion when asked if he had suicidal thoughts patient replied "Not yet today."       Multiple modalities of treatment were used including medication, individual and group therapies, unit programming, improved nutrition, physical activity, and family sessions as needed. The patient's medications for his multiple sclerosis were continued upon admission. His Lexapro was increased to 20 mg daily and his Wellbutrin XL to 300 mg to help decrease his depression and suicidal thoughts. Patient was also started on Adderrall XR 10 mg to help improve his energy level related to his medical problems and depression.      The symptoms of depression were monitored daily by evaluation by clinical provider.  The patient's mental and emotional status  was evaluated by a daily self inventory completed by the patient. Improvement was demonstrated by declining numbers on the self assessment, improving vital signs, increased cognition, and improvement in mood, sleep, appetite as well as a reduction in physical symptoms.       The patient was evaluated and found to be stable enough for discharge and was released  to home per the initial plan of treatment. Patient was given prescriptions for his newly adjusted medications. [name] was found to be physically and mentally stable for discharge. Patient denied SI on day of discharge and reported stable mood. He will be returning to where he has resided for three years, [facility]. Patient reported having some coping skills now to deal with his suicidal thoughts that are his baseline. The patient has a referral for an ACT team that was put in place by case management to increase his existing support system.   Mental Status Exam:  For mental status exam please see mental status exam and  suicide risk assessment completed by attending physician prior to discharge.    Consults:  None  Significant Diagnostic Studies:  labs: Chem profile, CBC, UDS  Discharge Vitals:   Blood pressure 89/63, pulse 84, temperature 97.2 F (36.2 C), temperature source Oral, resp. rate 16, height 5\' 11"  (1.803 m), weight 81.647 kg (180 lb). Body mass index is 25.12 kg/(m^2). Lab Results:   No results found for this or any previous visit (from the past 72 hour(s)).  Physical Findings: AIMS: Facial and Oral Movements Muscles of Facial Expression: None, normal Lips and Perioral Area: None, normal Jaw: None, normal Tongue: None, normal,Extremity Movements Upper (arms, wrists, hands, fingers): None, normal Lower (legs, knees, ankles, toes): None, normal, Trunk Movements Neck, shoulders, hips: None, normal, Overall Severity Severity of abnormal movements (highest score from questions above): None, normal Incapacitation due to abnormal movements: None, normal Patient's awareness of abnormal movements (rate only patient's report): No Awareness, Dental Status Current problems with teeth and/or dentures?: No Does patient usually wear dentures?: No  CIWA:    COWS:     Psychiatric Specialty Exam: See Psychiatric Specialty Exam and Suicide Risk Assessment completed by Attending  Physician prior to discharge.  Discharge destination:  Other:  [facility]  Is patient on multiple antipsychotic therapies at discharge:  No   Has Patient had three or more failed trials of antipsychotic monotherapy by history:  No  Recommended Plan for Multiple Antipsychotic Therapies: N/A  Discharge Orders   Future Orders Complete By Expires     Activity as tolerated - No restrictions  As directed     Diet - low sodium heart healthy  As directed         Medication List    TAKE these medications     Indication   albuterol 108 (90 BASE) MCG/ACT inhaler  Commonly known as:  PROVENTIL HFA;VENTOLIN HFA  Inhale 2 puffs into the lungs 4 (four) times daily.   Indication:  Chronic Obstructive Lung Disease     amphetamine-dextroamphetamine 10 MG 24 hr capsule  Commonly known as:  ADDERALL XR  Take 1 capsule (10 mg total) by mouth daily.   Indication:  Depression and MS     buPROPion 300 MG 24 hr tablet  Commonly known as:  WELLBUTRIN XL  Take 1 tablet (300 mg total) by mouth daily. For depression   Indication:  Major Depressive Disorder     Dimethyl Fumarate 240 MG Cpdr  Commonly known as:  TECFIDERA  Take 1 capsule (240 mg total) by mouth 2 (two) times daily.   Indication:  Multiple Sclerosis     escitalopram 20 MG tablet  Commonly known as:  LEXAPRO  Take 1 tablet (20 mg total) by mouth daily. For depression   Indication:  Depression     nabumetone 500 MG tablet  Commonly known as:  RELAFEN  Take 1 tablet (500 mg total) by mouth 2 (two) times daily.   Indication:  Joint Damage causing Pain and Loss of Function           Follow-up Information   Follow up with [facility] On 09/26/2012. (Please go to [facility] walk in clinic on Friday, September 26, 2012 or any weekday between 8AM-3PM for medication managment..A referral has been made to [facility]'s ACT TEAM for additonal services.  Someone from the Teams will contact you.)    Contact information:   [address]  [place], [place]   [address]  [phone]      Follow-up recommendations:  Activity:  Resume usual activities.  Diet:  Regular  Comments:   Take all your medications as prescribed by your mental healthcare provider.  Report any adverse effects and or reactions from your medicines to your outpatient provider promptly.  Patient is instructed and cautioned to not engage in alcohol and or illegal drug use while on prescription medicines.  In the event of worsening symptoms, patient is instructed to call the crisis hotline, 911 and or go to the nearest ED for appropriate evaluation and treatment of symptoms.  Follow-up with your primary care provider for your other medical issues, concerns and or health care needs.   Total Discharge Time:  Greater than 30 minutes.  SignedFransisca Kaufmann NP-C 09/25/2012, 10:51 AM  Patient is personally seen, examined for suicidal risk assessment, case discussed with case manager and physician extender. Reviewed the information documented and agree with the treatment plan.   Kaari Zeigler,JANARDHAHA R. 09/25/2012 6:45 PM

## 2012-09-25 NOTE — BHH Group Notes (Signed)
[facility] LCSW Aftercare Discharge Planning Group Note      09/25/2012 10:37 AM  Participation Quality:  Appropriate   Mood/Affect: Appropriate  Depression Rating: 5  Anxiety Rating:  0  Thoughts of Suicide:  No  Will you contract for safety?   NA  Current AVH:  No  Plan for Discharge/Comments:  Patient reports feeling all right today and ready to discharge home.  He denies SI/HI and rates symptoms at five.  He was informed Clinical research associate spoke with staff member of [facility] yesterday and he will be transported to the facility around 15:00 today.  Transportation Means: Patient has transportation.   Supports: Patient has a good support system.   Jalayla Chrismer, Joesph July

## 2012-09-29 NOTE — Progress Notes (Signed)
Patient Discharge Instructions:  After Visit Summary (AVS):   Faxed to:  09/29/12 Discharge Summary Note:   Faxed to:  09/29/12 Psychiatric Admission Assessment Note:   Faxed to:  09/29/12 Suicide Risk Assessment - Discharge Assessment:   Faxed to:  09/29/12 Faxed/Sent to the Next Level Care provider:  09/29/12 Faxed to [facility] @ [phone] Faxed to [facility] @ [phone] Records sent via mail to: [facility] [address] [place], [place] [address]  Jerelene Redden, 09/29/2012, 3:48 PM

## 2012-10-09 ENCOUNTER — Encounter (HOSPITAL_COMMUNITY): Payer: Self-pay

## 2012-10-09 ENCOUNTER — Emergency Department (HOSPITAL_COMMUNITY)
Admission: EM | Admit: 2012-10-09 | Discharge: 2012-10-10 | Disposition: A | Discharge status: Other Acute Inpatient Hospital | Payer: Medicare Other | Attending: Emergency Medicine | Admitting: Emergency Medicine

## 2012-10-09 DIAGNOSIS — Z79899 Other long term (current) drug therapy: Secondary | ICD-10-CM | POA: Insufficient documentation

## 2012-10-09 DIAGNOSIS — R45851 Suicidal ideations: Secondary | ICD-10-CM

## 2012-10-09 DIAGNOSIS — F17200 Nicotine dependence, unspecified, uncomplicated: Secondary | ICD-10-CM | POA: Insufficient documentation

## 2012-10-09 DIAGNOSIS — F32A Depression, unspecified: Secondary | ICD-10-CM

## 2012-10-09 DIAGNOSIS — Z8669 Personal history of other diseases of the nervous system and sense organs: Secondary | ICD-10-CM | POA: Insufficient documentation

## 2012-10-09 DIAGNOSIS — F329 Major depressive disorder, single episode, unspecified: Secondary | ICD-10-CM | POA: Insufficient documentation

## 2012-10-09 DIAGNOSIS — G35 Multiple sclerosis: Secondary | ICD-10-CM | POA: Insufficient documentation

## 2012-10-09 DIAGNOSIS — F3289 Other specified depressive episodes: Secondary | ICD-10-CM | POA: Insufficient documentation

## 2012-10-09 LAB — COMPREHENSIVE METABOLIC PANEL
ALT: 10 U/L (ref 0–53)
AST: 12 U/L (ref 0–37)
Albumin: 4.1 g/dL (ref 3.5–5.2)
Alkaline Phosphatase: 56 U/L (ref 39–117)
BUN: 12 mg/dL (ref 6–23)
CO2: 25 mEq/L (ref 19–32)
Calcium: 9.1 mg/dL (ref 8.4–10.5)
Chloride: 106 mEq/L (ref 96–112)
Creatinine, Ser: 1.02 mg/dL (ref 0.50–1.35)
GFR calc Af Amer: >90 mL/min (ref >90)
GFR calc non Af Amer: 80 mL/min — ABNORMAL LOW (ref >90)
Glucose, Bld: 83 mg/dL (ref 70–99)
Potassium: 3.8 mEq/L (ref 3.5–5.1)
Sodium: 139 mEq/L (ref 135–145)
Total Bilirubin: 0.4 mg/dL (ref 0.3–1.2)
Total Protein: 6.9 g/dL (ref 6.0–8.3)

## 2012-10-09 LAB — CBC WITH DIFFERENTIAL/PLATELET
Basophils Absolute: 0 10*3/uL (ref 0.0–0.1)
Basophils Relative: 0 % (ref 0–1)
Eosinophils Absolute: 0.1 10*3/uL (ref 0.0–0.7)
Eosinophils Relative: 2 % (ref 0–5)
HCT: 40.2 % (ref 39.0–52.0)
Hemoglobin: 13.7 g/dL (ref 13.0–17.0)
Lymphocytes Relative: 30 % (ref 12–46)
Lymphs Abs: 1.7 10*3/uL (ref 0.7–4.0)
MCH: 30.9 pg (ref 26.0–34.0)
MCHC: 34.1 g/dL (ref 30.0–36.0)
MCV: 90.5 fL (ref 78.0–100.0)
Monocytes Absolute: 0.5 10*3/uL (ref 0.1–1.0)
Monocytes Relative: 9 % (ref 3–12)
Neutro Abs: 3.4 10*3/uL (ref 1.7–7.7)
Neutrophils Relative %: 59 % (ref 43–77)
Platelets: 191 10*3/uL (ref 150–400)
RBC: 4.44 MIL/uL (ref 4.22–5.81)
RDW: 14.4 % (ref 11.5–15.5)
WBC: 5.8 10*3/uL (ref 4.0–10.5)

## 2012-10-09 LAB — RAPID URINE DRUG SCREEN, HOSP PERFORMED
Amphetamines: NOT DETECTED
Barbiturates: NOT DETECTED
Benzodiazepines: NOT DETECTED
Cocaine: NOT DETECTED
Opiates: NOT DETECTED
Tetrahydrocannabinol: NOT DETECTED

## 2012-10-09 LAB — ETHANOL: Alcohol, Ethyl (B): <11 mg/dL (ref 0–11)

## 2012-10-09 MED ORDER — NICOTINE 21 MG/24HR TD PT24
21.0000 mg | MEDICATED_PATCH | Freq: Every day | TRANSDERMAL | Status: DC
  Filled 2012-10-09: qty 1

## 2012-10-09 MED ORDER — ZOLPIDEM TARTRATE 5 MG PO TABS: 5.0000 mg | ORAL_TABLET | Freq: Every evening | ORAL | Status: DC | PRN

## 2012-10-09 MED ORDER — ESCITALOPRAM OXALATE 10 MG PO TABS
20.0000 mg | ORAL_TABLET | Freq: Every day | ORAL | Status: DC
  Administered 2012-10-09 – 2012-10-10 (×2): 20 mg via ORAL
  Filled 2012-10-09: qty 2
  Filled 2012-10-09 (×2): qty 1

## 2012-10-09 MED ORDER — ONDANSETRON HCL 4 MG PO TABS: 4.0000 mg | ORAL_TABLET | Freq: Three times a day (TID) | ORAL | Status: DC | PRN

## 2012-10-09 MED ORDER — ALUM & MAG HYDROXIDE-SIMETH 200-200-20 MG/5ML PO SUSP: 30.0000 mL | ORAL | Status: DC | PRN

## 2012-10-09 MED ORDER — ALBUTEROL SULFATE HFA 108 (90 BASE) MCG/ACT IN AERS
2.0000 | INHALATION_SPRAY | Freq: Four times a day (QID) | RESPIRATORY_TRACT | Status: DC
  Filled 2012-10-09: qty 6.7

## 2012-10-09 MED ORDER — DIMETHYL FUMARATE 240 MG PO CPDR: 240.0000 mg | DELAYED_RELEASE_CAPSULE | Freq: Two times a day (BID) | ORAL | Status: DC

## 2012-10-09 MED ORDER — IBUPROFEN 600 MG PO TABS: 600.0000 mg | ORAL_TABLET | Freq: Three times a day (TID) | ORAL | Status: DC | PRN

## 2012-10-09 MED ORDER — ACETAMINOPHEN 325 MG PO TABS: 650.0000 mg | ORAL_TABLET | ORAL | Status: DC | PRN

## 2012-10-09 MED ORDER — BUPROPION HCL ER (XL) 300 MG PO TB24
300.0000 mg | ORAL_TABLET | Freq: Every day | ORAL | Status: DC
  Administered 2012-10-10: 300 mg via ORAL
  Filled 2012-10-09: qty 1

## 2012-10-09 MED ORDER — NABUMETONE 500 MG PO TABS
500.0000 mg | ORAL_TABLET | Freq: Two times a day (BID) | ORAL | Status: DC
  Administered 2012-10-10: 500 mg via ORAL
  Filled 2012-10-09 (×3): qty 1

## 2012-10-09 NOTE — ED Notes (Signed)
Pt transferred from triage, presents with SI, plan to walk into traffic or take pills.  Denies HI or AV hallucinations.  Feeling hopeless.  Pt calm & cooperative at present.

## 2012-10-09 NOTE — ED Provider Notes (Signed)
Medical screening examination/treatment/procedure(s) were performed by non-physician practitioner and as supervising physician I was immediately available for consultation/collaboration.  Skyley Grandmaison M Nahum Sherrer, MD 10/09/12 2007 

## 2012-10-09 NOTE — ED Provider Notes (Signed)
History     CSN: [id number]  Arrival date & time 10/09/12  1745   First MD Initiated Contact with Patient 10/09/12 1818      Chief Complaint  Patient presents with  . Medical Clearance    suicidal    (Consider location/radiation/quality/duration/timing/severity/associated sxs/prior treatment) HPI  [name] is a 57 y.o.male presenting to the ER with complaints of suicidal ideation pt accompanied by [facility]. He was at group therapy when he started expressing SI thoughts. They called [facility] to go get patient. Pt says he has been depressed on and off for two years. Has tried many different medications for depression, said Aderral worked the best but that her psychiatrist says its to dangerous and took him off of it. He denies any aggravating incidents that happened today. He "just got worse". Denies any medical complaints. Denies causing any injuries to himself. His plan is to go into traffic. Denies hallucinations, etoh or substance abuse. No HI.   Past Medical History  Diagnosis Date  . Mental disorder   . Depression   . Neuromuscular disorder   . Multiple sclerosis, relapsing-remitting   . MS (multiple sclerosis)     Past Surgical History  Procedure Laterality Date  . Tonsillectomy      History reviewed. No pertinent family history.  History  Substance Use Topics  . Smoking status: Current Every Day Smoker -- 0.50 packs/day for 36 years    Types: Cigarettes  . Smokeless tobacco: Never Used  . Alcohol Use: Yes     Comment: "hardly drink except when I get into a hotel room, probably every 6 months or so"      Review of Systems  Psychiatric/Behavioral: Positive for suicidal ideas.  All other systems reviewed and are negative.    Allergies  Review of patient's allergies indicates no known allergies.  Home Medications   Current Outpatient Rx  Name  Route  Sig  Dispense  Refill  . albuterol (PROVENTIL HFA;VENTOLIN HFA) 108 (90 BASE) MCG/ACT  inhaler   Inhalation   Inhale 2 puffs into the lungs 4 (four) times daily.   1 Inhaler   0   . buPROPion (WELLBUTRIN XL) 300 MG 24 hr tablet   Oral   Take 1 tablet (300 mg total) by mouth daily. For depression   30 tablet   0   . Dimethyl Fumarate (TECFIDERA) 240 MG CPDR   Oral   Take 1 capsule (240 mg total) by mouth 2 (two) times daily.   60 capsule   0   . escitalopram (LEXAPRO) 20 MG tablet   Oral   Take 1 tablet (20 mg total) by mouth daily. For depression   30 tablet   0   . nabumetone (RELAFEN) 500 MG tablet   Oral   Take 1 tablet (500 mg total) by mouth 2 (two) times daily.   60 tablet   0     BP 117/78  Pulse 87  Temp(Src) 98.6 F (37 C) (Oral)  Resp 20  SpO2 95%  Physical Exam  Nursing note and vitals reviewed. Constitutional: He appears well-developed and well-nourished. No distress.  HENT:  Head: Normocephalic and atraumatic.  Eyes: Pupils are equal, round, and reactive to light.  Neck: Normal range of motion. Neck supple.  Cardiovascular: Normal rate and regular rhythm.   Pulmonary/Chest: Effort normal.  Abdominal: Soft.  Neurological: He is alert.  Skin: Skin is warm and dry.  Psychiatric: His speech is normal and behavior is  normal. He exhibits a depressed mood. He expresses suicidal ideation. He expresses no homicidal ideation. He expresses suicidal plans. He expresses no homicidal plans.    ED Course  Procedures (including critical care time)  Labs Reviewed  CBC WITH DIFFERENTIAL  COMPREHENSIVE METABOLIC PANEL  URINE RAPID DRUG SCREEN (HOSP PERFORMED)  ETHANOL   No results found.   1. Depression   2. Suicidal ideation       MDM  [facility] here. Holding orders placed Med rec completed.   Labs pending.  Filed Vitals:   10/09/12 1759  BP: 117/78  Pulse: 87  Temp: 98.6 F (37 C)  Resp: [address]           Dorthula Matas, PA-C 10/09/12 1943

## 2012-10-09 NOTE — ED Notes (Signed)
Patient is requesting medical clearance. Patient states that he is suicidal and has a plan to "play in traffic." Patient denies any visual or auditory hallucinations.

## 2012-10-10 NOTE — ED Provider Notes (Signed)
Pt accepted to [facility] to Dr. Elby Showers. Will transfer stable.   Laray Anger, DO 10/10/12 1109

## 2012-10-10 NOTE — Consult Note (Signed)
Reason for Consult: SI, Major depressive d/o Referring Physician:Mcmanus K  COLA [name] is an 57 y.o. male.  HPI: Patient was brought in by [facility] for suicidal thoughts with plans to jump into traffic.  He has a history of past SA by overdosing with over the counter sleeping pills.  He was recently treated and discharged from our [facility] inpatient unit.   He reports being depressed all of his life and have been tried on different medications.  He reports feeling hopeless and does not have anything to live for.  He also spends time on weekends with his only son who plays video game with him.  He was asked if he enjoys spending time with  His son he says yes and then realized he has "some love" and has not lost everything as he stated earlier. He reports spending his time alone and does not have friends in and out of the group home where he lives.  He could not contract for safety, denies AVH during the interview.  He has good eye contact and willingly responded to questions .  He is moderately groomed and reports good sleep last night.  Past Medical History  Diagnosis Date  . Mental disorder   . Depression   . Neuromuscular disorder   . Multiple sclerosis, relapsing-remitting   . MS (multiple sclerosis)     Past Surgical History  Procedure Laterality Date  . Tonsillectomy      History reviewed. No pertinent family history.  Social History:  reports that he has been smoking Cigarettes.  He has a 18 pack-year smoking history. He has never used smokeless tobacco. He reports that  drinks alcohol. He reports that he does not use illicit drugs.  Allergies: No Known Allergies  Medications: I have reviewed the patient's current medications.  Results for orders placed during the hospital encounter of 10/09/12 (from the past 48 hour(s))  CBC WITH DIFFERENTIAL     Status: None   Collection Time    10/09/12  6:20 PM      Result Value Range   WBC 5.8  4.0 - 10.5 K/uL   RBC 4.44  4.22 - 5.81  MIL/uL   Hemoglobin 13.7  13.0 - 17.0 g/dL   HCT 40.9  81.1 - 91.4 %   MCV 90.5  78.0 - 100.0 fL   MCH 30.9  26.0 - 34.0 pg   MCHC 34.1  30.0 - 36.0 g/dL   RDW 78.2  95.6 - 21.3 %   Platelets 191  150 - 400 K/uL   Neutrophils Relative % 59  43 - 77 %   Neutro Abs 3.4  1.7 - 7.7 K/uL   Lymphocytes Relative 30  12 - 46 %   Lymphs Abs 1.7  0.7 - 4.0 K/uL   Monocytes Relative 9  3 - 12 %   Monocytes Absolute 0.5  0.1 - 1.0 K/uL   Eosinophils Relative 2  0 - 5 %   Eosinophils Absolute 0.1  0.0 - 0.7 K/uL   Basophils Relative 0  0 - 1 %   Basophils Absolute 0.0  0.0 - 0.1 K/uL  COMPREHENSIVE METABOLIC PANEL     Status: Abnormal   Collection Time    10/09/12  6:20 PM      Result Value Range   Sodium 139  135 - 145 mEq/L   Potassium 3.8  3.5 - 5.1 mEq/L   Chloride 106  96 - 112 mEq/L   CO2 25  19 - 32 mEq/L   Glucose, Bld 83  70 - 99 mg/dL   BUN 12  6 - 23 mg/dL   Creatinine, Ser 4.78  0.50 - 1.35 mg/dL   Calcium 9.1  8.4 - 29.5 mg/dL   Total Protein 6.9  6.0 - 8.3 g/dL   Albumin 4.1  3.5 - 5.2 g/dL   AST 12  0 - 37 U/L   ALT 10  0 - 53 U/L   Alkaline Phosphatase 56  39 - 117 U/L   Total Bilirubin 0.4  0.3 - 1.2 mg/dL   GFR calc non Af Amer 80 (*) >90 mL/min   GFR calc Af Amer >90  >90 mL/min   Comment:            The eGFR has been calculated     using the CKD EPI equation.     This calculation has not been     validated in all clinical     situations.     eGFR's persistently     <90 mL/min signify     possible Chronic Kidney Disease.  ETHANOL     Status: None   Collection Time    10/09/12  6:20 PM      Result Value Range   Alcohol, Ethyl (B) <11  0 - 11 mg/dL   Comment:            LOWEST DETECTABLE LIMIT FOR     SERUM ALCOHOL IS 11 mg/dL     FOR MEDICAL PURPOSES ONLY  URINE RAPID DRUG SCREEN (HOSP PERFORMED)     Status: None   Collection Time    10/09/12  8:04 PM      Result Value Range   Opiates NONE DETECTED  NONE DETECTED   Cocaine NONE DETECTED  NONE DETECTED    Benzodiazepines NONE DETECTED  NONE DETECTED   Amphetamines NONE DETECTED  NONE DETECTED   Tetrahydrocannabinol NONE DETECTED  NONE DETECTED   Barbiturates NONE DETECTED  NONE DETECTED   Comment:            DRUG SCREEN FOR MEDICAL PURPOSES     ONLY.  IF CONFIRMATION IS NEEDED     FOR ANY PURPOSE, NOTIFY LAB     WITHIN 5 DAYS.                LOWEST DETECTABLE LIMITS     FOR URINE DRUG SCREEN     Drug Class       Cutoff (ng/mL)     Amphetamine      1000     Barbiturate      200     Benzodiazepine   200     Tricyclics       300     Opiates          300     Cocaine          300     THC              50    No results found.  Review of Systems  Constitutional: Negative.  Negative for fever, chills, weight loss, malaise/fatigue and diaphoresis.  HENT: Negative.  Negative for hearing loss, ear pain, nosebleeds, sore throat, neck pain, tinnitus and ear discharge.   Eyes: Negative.  Negative for blurred vision, double vision, photophobia, pain, discharge and redness.  Respiratory: Negative.  Negative for cough, hemoptysis, sputum production, shortness of breath, wheezing and stridor.   Cardiovascular: Negative.  Negative for chest pain, palpitations, orthopnea, claudication, leg swelling and PND.  Gastrointestinal: Negative.  Negative for heartburn, nausea, vomiting, abdominal pain, diarrhea, constipation, blood in stool and melena.  Genitourinary: Negative.  Negative for dysuria, urgency, frequency, hematuria and flank pain.  Musculoskeletal: Negative.  Negative for myalgias, back pain, joint pain and falls.  Skin: Negative for itching.       C/O red patches on both palm of his hands for about three months.  Non itching, not raised.  Neurological: Negative.  Negative for dizziness, tingling, tremors, sensory change, speech change, focal weakness, seizures, loss of consciousness, weakness and headaches.  Endo/Heme/Allergies: Negative.  Negative for environmental allergies and polydipsia.  Does not bruise/bleed easily.  Psychiatric/Behavioral: Positive for depression (Rates depression 9/10.  Already on antidepressant) and suicidal ideas (Endorses suicide, plan to walk into traffic, unable to contract for safety.). Negative for hallucinations, memory loss and substance abuse. The patient is not nervous/anxious and does not have insomnia.    Blood pressure 106/64, pulse 60, temperature 97.7 F (36.5 C), temperature source Oral, resp. rate 18, SpO2 96.00%. Physical Exam  Constitutional: He appears well-developed and well-nourished. No distress.  HENT:  Head: Normocephalic and atraumatic.  Nose: Nose normal.  Mouth/Throat: Oropharynx is clear and moist.  Skin: He is not diaphoretic.    Assessment/Plan: Consulted and face and face interview with Dr Rosette Reveal.   Recommendation:  Will transfer to [facility] for treatment.  Patient is already accepted.  Dahlia Byes, C 10/10/2012, 10:29 AM

## 2012-10-31 NOTE — Consult Note (Signed)
I agree with the assessment. 

## 2012-11-06 NOTE — H&P (Signed)
Patient seen and assessed. Agree with assessment.

## 2012-12-16 ENCOUNTER — Emergency Department (HOSPITAL_COMMUNITY)
Admission: EM | Admit: 2012-12-16 | Discharge: 2012-12-17 | Disposition: A | Discharge status: Home / Self Care | Payer: Medicare Other | Attending: Emergency Medicine | Admitting: Emergency Medicine

## 2012-12-16 ENCOUNTER — Encounter (HOSPITAL_COMMUNITY): Payer: Self-pay

## 2012-12-16 DIAGNOSIS — F3289 Other specified depressive episodes: Secondary | ICD-10-CM | POA: Insufficient documentation

## 2012-12-16 DIAGNOSIS — F17200 Nicotine dependence, unspecified, uncomplicated: Secondary | ICD-10-CM | POA: Insufficient documentation

## 2012-12-16 DIAGNOSIS — Z8659 Personal history of other mental and behavioral disorders: Secondary | ICD-10-CM | POA: Insufficient documentation

## 2012-12-16 DIAGNOSIS — R062 Wheezing: Secondary | ICD-10-CM | POA: Insufficient documentation

## 2012-12-16 DIAGNOSIS — F411 Generalized anxiety disorder: Secondary | ICD-10-CM | POA: Insufficient documentation

## 2012-12-16 DIAGNOSIS — Z8669 Personal history of other diseases of the nervous system and sense organs: Secondary | ICD-10-CM | POA: Insufficient documentation

## 2012-12-16 DIAGNOSIS — Z79899 Other long term (current) drug therapy: Secondary | ICD-10-CM | POA: Insufficient documentation

## 2012-12-16 DIAGNOSIS — F32A Depression, unspecified: Secondary | ICD-10-CM

## 2012-12-16 DIAGNOSIS — R45851 Suicidal ideations: Secondary | ICD-10-CM | POA: Insufficient documentation

## 2012-12-16 DIAGNOSIS — F329 Major depressive disorder, single episode, unspecified: Secondary | ICD-10-CM | POA: Insufficient documentation

## 2012-12-16 LAB — CBC
HCT: 46 % (ref 39.0–52.0)
Hemoglobin: 15.4 g/dL (ref 13.0–17.0)
MCH: 30.7 pg (ref 26.0–34.0)
MCHC: 33.5 g/dL (ref 30.0–36.0)
MCV: 91.6 fL (ref 78.0–100.0)
Platelets: 204 10*3/uL (ref 150–400)
RBC: 5.02 MIL/uL (ref 4.22–5.81)
RDW: 14.1 % (ref 11.5–15.5)
WBC: 7.4 10*3/uL (ref 4.0–10.5)

## 2012-12-16 LAB — COMPREHENSIVE METABOLIC PANEL
ALT: 11 U/L (ref 0–53)
AST: 15 U/L (ref 0–37)
Albumin: 4.2 g/dL (ref 3.5–5.2)
Alkaline Phosphatase: 68 U/L (ref 39–117)
BUN: 12 mg/dL (ref 6–23)
CO2: 26 mEq/L (ref 19–32)
Calcium: 9.4 mg/dL (ref 8.4–10.5)
Chloride: 105 mEq/L (ref 96–112)
Creatinine, Ser: 1.09 mg/dL (ref 0.50–1.35)
GFR calc Af Amer: 85 mL/min — ABNORMAL LOW (ref >90)
GFR calc non Af Amer: 74 mL/min — ABNORMAL LOW (ref >90)
Glucose, Bld: 83 mg/dL (ref 70–99)
Potassium: 4.4 mEq/L (ref 3.5–5.1)
Sodium: 140 mEq/L (ref 135–145)
Total Bilirubin: 0.4 mg/dL (ref 0.3–1.2)
Total Protein: 7.3 g/dL (ref 6.0–8.3)

## 2012-12-16 LAB — ETHANOL: Alcohol, Ethyl (B): <11 mg/dL (ref 0–11)

## 2012-12-16 LAB — RAPID URINE DRUG SCREEN, HOSP PERFORMED
Amphetamines: NOT DETECTED
Barbiturates: NOT DETECTED
Benzodiazepines: NOT DETECTED
Cocaine: NOT DETECTED
Opiates: NOT DETECTED
Tetrahydrocannabinol: NOT DETECTED

## 2012-12-16 MED ORDER — IBUPROFEN 200 MG PO TABS: 600.0000 mg | ORAL_TABLET | Freq: Three times a day (TID) | ORAL | Status: DC | PRN

## 2012-12-16 MED ORDER — ZOLPIDEM TARTRATE 5 MG PO TABS: 5.0000 mg | ORAL_TABLET | Freq: Every evening | ORAL | Status: DC | PRN

## 2012-12-16 MED ORDER — NICOTINE 21 MG/24HR TD PT24
21.0000 mg | MEDICATED_PATCH | Freq: Every day | TRANSDERMAL | Status: DC
  Filled 2012-12-16 (×2): qty 1

## 2012-12-16 MED ORDER — ALBUTEROL SULFATE HFA 108 (90 BASE) MCG/ACT IN AERS
2.0000 | INHALATION_SPRAY | Freq: Four times a day (QID) | RESPIRATORY_TRACT | Status: DC
  Administered 2012-12-17 (×2): 2 via RESPIRATORY_TRACT
  Filled 2012-12-16 (×3): qty 6.7

## 2012-12-16 MED ORDER — ACETAMINOPHEN 325 MG PO TABS: 650.0000 mg | ORAL_TABLET | ORAL | Status: DC | PRN

## 2012-12-16 MED ORDER — ONDANSETRON HCL 4 MG PO TABS: 4.0000 mg | ORAL_TABLET | Freq: Three times a day (TID) | ORAL | Status: DC | PRN

## 2012-12-16 MED ORDER — ESCITALOPRAM OXALATE 10 MG PO TABS
20.0000 mg | ORAL_TABLET | Freq: Every day | ORAL | Status: DC
  Administered 2012-12-16 – 2012-12-17 (×2): 20 mg via ORAL
  Filled 2012-12-16 (×2): qty 1
  Filled 2012-12-16: qty 2

## 2012-12-16 MED ORDER — BUPROPION HCL ER (XL) 300 MG PO TB24
300.0000 mg | ORAL_TABLET | Freq: Every day | ORAL | Status: DC
  Administered 2012-12-16 – 2012-12-17 (×2): 300 mg via ORAL
  Filled 2012-12-16 (×3): qty 1

## 2012-12-16 MED ORDER — LORAZEPAM 1 MG PO TABS: 1.0000 mg | ORAL_TABLET | Freq: Three times a day (TID) | ORAL | Status: DC | PRN

## 2012-12-16 NOTE — BH Assessment (Addendum)
Consulted with Fenton Foy, RN at [facility] regarding Tele-assessment consult that was scheduled for pt at 1800. This assessor attempted to assess pt at scheduled time and tele-assessment cart was not in position for assessment to be completed at this time Per Diane they are having to place pt in the Psych ED or another room that will be appropriate to meet his needs as she reports pt has a cane and may need special accomodations creating a barrier for tele-assessment that could not be completed as scheduled by TTS. Diane reports that she will call TTS([facility] Assessment Office) back when pt can be assessed via tele-assessment.    Glorious Peach, MS, LCASA Assessment Counselor

## 2012-12-16 NOTE — ED Provider Notes (Signed)
CSN: [id number]     Arrival date & time 12/16/12  1546 History   First MD Initiated Contact with Patient 12/16/12 1717     Chief Complaint  Patient presents with  . Medical Clearance   (Consider location/radiation/quality/duration/timing/severity/associated sxs/prior Treatment) HPI Patient is a 57 year old male who presents to emergency department complaining of worsening depression suicidal ideations. Patient states he has had depression for many years has been in and out of bed psychiatric hospital for the same complaints. Patient states his depression worsened in the last 2 weeks. States in the last few days he has had thoughts of killing himself. Today he was outside and states he was about to jump in front of some moving cars when someone saw him and called [employer]. Patient was brought to [facility] by police. Patient was IVC at on our consent here for medical clearance. Patient continues to admit worsening depression with no known stressors and continues to admit to wanting to kill himself by jumping into traffic. Patient states he takes his regular medications and states that he has not missed any doses. Patient also admits to having en masse and states he has not been taking his medication for it because he is unable to afford it. Patient denies any drugs or alcohol. Patient denies any HI. Past Medical History  Diagnosis Date  . Mental disorder   . Depression   . Neuromuscular disorder   . Multiple sclerosis, relapsing-remitting   . MS (multiple sclerosis)    Past Surgical History  Procedure Laterality Date  . Tonsillectomy     No family history on file. History  Substance Use Topics  . Smoking status: Current Every Day Smoker -- 0.50 packs/day for 36 years    Types: Cigarettes  . Smokeless tobacco: Never Used  . Alcohol Use: Yes     Comment: "hardly drink except when I get into a hotel room, probably every 6 months or so"    Review of Systems  Constitutional: Negative for fever and  chills.  HENT: Negative for neck pain and neck stiffness.   Respiratory: Negative for cough, chest tightness and shortness of breath.   Cardiovascular: Negative for chest pain, palpitations and leg swelling.  Gastrointestinal: Negative for nausea, vomiting, abdominal pain, diarrhea and abdominal distention.  Genitourinary: Negative for dysuria, urgency, frequency and hematuria.  Musculoskeletal: Negative for myalgias and arthralgias.  Skin: Negative for rash.  Allergic/Immunologic: Negative for immunocompromised state.  Neurological: Negative for dizziness, weakness, light-headedness, numbness and headaches.  Psychiatric/Behavioral: Positive for suicidal ideas and decreased concentration. The patient is nervous/anxious.        Depression    Allergies  Review of patient's allergies indicates no known allergies.  Home Medications   Current Outpatient Rx  Name  Route  Sig  Dispense  Refill  . buPROPion (WELLBUTRIN XL) 300 MG 24 hr tablet   Oral   Take 1 tablet (300 mg total) by mouth daily. For depression   30 tablet   0   . escitalopram (LEXAPRO) 20 MG tablet   Oral   Take 1 tablet (20 mg total) by mouth daily. For depression   30 tablet   0   . albuterol (PROVENTIL HFA;VENTOLIN HFA) 108 (90 BASE) MCG/ACT inhaler   Inhalation   Inhale 2 puffs into the lungs 4 (four) times daily.   1 Inhaler   0   . Dimethyl Fumarate (TECFIDERA) 240 MG CPDR   Oral   Take 1 capsule (240 mg total) by mouth 2 (two)  times daily.   60 capsule   0    BP 97/78  Pulse 91  Temp(Src) 98.6 F (37 C) (Oral)  Resp 20  SpO2 99% Physical Exam  Nursing note and vitals reviewed. Constitutional: He appears well-developed and well-nourished. No distress.  HENT:  Head: Normocephalic and atraumatic.  Eyes: Conjunctivae are normal.  Neck: Neck supple.  Cardiovascular: Normal rate, regular rhythm and normal heart sounds.   Pulmonary/Chest: Effort normal. No respiratory distress. He has wheezes. He  has no rales.  Wheezes in all lung fields expiratory.  Abdominal: Soft. Bowel sounds are normal. He exhibits no distension. There is no tenderness. There is no rebound.  Musculoskeletal: He exhibits no edema.  Neurological: He is alert.  Skin: Skin is warm and dry.  Psychiatric: His behavior is normal. Thought content normal.  Patient has flat affect. Appears depressed.    ED Course  Procedures (including critical care time) Labs Review Results for orders placed during the hospital encounter of 12/16/12  CBC      Result Value Range   WBC 7.4  4.0 - 10.5 K/uL   RBC 5.02  4.22 - 5.81 MIL/uL   Hemoglobin 15.4  13.0 - 17.0 g/dL   HCT 16.1  09.6 - 04.5 %   MCV 91.6  78.0 - 100.0 fL   MCH 30.7  26.0 - 34.0 pg   MCHC 33.5  30.0 - 36.0 g/dL   RDW 40.9  81.1 - 91.4 %   Platelets 204  150 - 400 K/uL  COMPREHENSIVE METABOLIC PANEL      Result Value Range   Sodium 140  135 - 145 mEq/L   Potassium 4.4  3.5 - 5.1 mEq/L   Chloride 105  96 - 112 mEq/L   CO2 26  19 - 32 mEq/L   Glucose, Bld 83  70 - 99 mg/dL   BUN 12  6 - 23 mg/dL   Creatinine, Ser 7.82  0.50 - 1.35 mg/dL   Calcium 9.4  8.4 - 95.6 mg/dL   Total Protein 7.3  6.0 - 8.3 g/dL   Albumin 4.2  3.5 - 5.2 g/dL   AST 15  0 - 37 U/L   ALT 11  0 - 53 U/L   Alkaline Phosphatase 68  39 - 117 U/L   Total Bilirubin 0.4  0.3 - 1.2 mg/dL   GFR calc non Af Amer 74 (*) >90 mL/min   GFR calc Af Amer 85 (*) >90 mL/min  ETHANOL      Result Value Range   Alcohol, Ethyl (B) <11  0 - 11 mg/dL   No results found.   Imaging Review No results found.  MDM  No diagnosis found.   Pt with SI and depression. NO drugs or alcohol. Multiple visits to ER for the same. IVCed by [employer]. Will get ACT involved.       Lottie Mussel, PA-C 12/16/12 1809

## 2012-12-16 NOTE — ED Notes (Signed)
Pt brought in by [employer], pt IVC from [facility]; stats pt c/o SI with a plan of walking into traffic

## 2012-12-17 NOTE — ED Notes (Signed)
Psych team at bedside .

## 2012-12-17 NOTE — Consult Note (Signed)
[facility] Psychiatry Consult   Reason for Consult:  Evaluation for inpatient treatment Referring Physician:  EDP  [name] is an 57 y.o. male.  Assessment: AXIS I:  Major Depressive Disorder AXIS II:  Deferred AXIS III:   Past Medical History  Diagnosis Date  . Mental disorder   . Depression   . Neuromuscular disorder   . Multiple sclerosis, relapsing-remitting   . MS (multiple sclerosis)    AXIS IV:  other psychosocial or environmental problems, problems related to social environment and problems with primary support group AXIS V:  51-60 moderate symptoms  Plan:  No evidence of imminent risk to self or others at present.   Supportive therapy provided about ongoing stressors. Discussed crisis plan, support from social network, calling 911, coming to the Emergency Department, and calling Suicide Hotline.  Subjective:   [name] is a 57 y.o. male  HPI:  Patient states that he has a long history of depression and that from time to time he has suicidal thoughts.  States that yesterday the thought just came over him to walk into traffic.  Patient states that he was at the [employer] [facility] [employer] when he started to walk towards the street and a staff member stopped him before he got to the street.  Patient states today he feels better and continues to be depressed but is no longer having suicidal thoughts.  Patient states that he stays in a group home who handles and gives him his medication and that he takes medication daily.  Patient states that he gets support through [facility] and [facility] for medication management. Patient denies suicidal ideation, homicidal ideation, psychosis, and paranoia.  Spoke with staff member of group home as states that patient is able to return once discharges.  States that they have no problems with patient. CW spoke with staff members of Group home [facility]) and, [employer] [facility], and [employer] [facility] ([facility]) Lynetta Mare.  Ms.  Lawerance Bach states that if patient discharged in time to day she can follow up with him once patient returns to group home if not she will follow up with him tomorrow  Past Psychiatric History: Past Medical History  Diagnosis Date  . Mental disorder   . Depression   . Neuromuscular disorder   . Multiple sclerosis, relapsing-remitting   . MS (multiple sclerosis)     reports that he has been smoking Cigarettes.  He has a 18 pack-year smoking history. He has never used smokeless tobacco. He reports that  drinks alcohol. He reports that he does not use illicit drugs. No family history on file.         Allergies:  No Known Allergies  Past Psychiatric History: Diagnosis:  MDD  Hospitalizations:  yes  Outpatient Care:  yes  Substance Abuse Care:    Self-Mutilation:    Suicidal Attempts:  Yes prior attempts  Violent Behaviors:  Denies   Objective: Blood pressure 102/69, pulse 62, temperature 98.7 F (37.1 C), temperature source Oral, resp. rate 20, SpO2 94.00%.There is no weight on file to calculate BMI. Results for orders placed during the hospital encounter of 12/16/12 (from the past 72 hour(s))  CBC     Status: None   Collection Time    12/16/12  4:50 PM      Result Value Range   WBC 7.4  4.0 - 10.5 K/uL   RBC 5.02  4.22 - 5.81 MIL/uL   Hemoglobin 15.4  13.0 - 17.0  g/dL   HCT 16.1  09.6 - 04.5 %   MCV 91.6  78.0 - 100.0 fL   MCH 30.7  26.0 - 34.0 pg   MCHC 33.5  30.0 - 36.0 g/dL   RDW 40.9  81.1 - 91.4 %   Platelets 204  150 - 400 K/uL  COMPREHENSIVE METABOLIC PANEL     Status: Abnormal   Collection Time    12/16/12  4:50 PM      Result Value Range   Sodium 140  135 - 145 mEq/L   Potassium 4.4  3.5 - 5.1 mEq/L   Chloride 105  96 - 112 mEq/L   CO2 26  19 - 32 mEq/L   Glucose, Bld 83  70 - 99 mg/dL   BUN 12  6 - 23 mg/dL   Creatinine, Ser 7.82  0.50 - 1.35 mg/dL   Calcium 9.4  8.4 - 95.6 mg/dL   Total Protein 7.3  6.0 - 8.3 g/dL   Albumin 4.2  3.5 - 5.2 g/dL   AST 15  0  - 37 U/L   ALT 11  0 - 53 U/L   Alkaline Phosphatase 68  39 - 117 U/L   Total Bilirubin 0.4  0.3 - 1.2 mg/dL   GFR calc non Af Amer 74 (*) >90 mL/min   GFR calc Af Amer 85 (*) >90 mL/min   Comment: (NOTE)     The eGFR has been calculated using the CKD EPI equation.     This calculation has not been validated in all clinical situations.     eGFR's persistently <90 mL/min signify possible Chronic Kidney     Disease.  ETHANOL     Status: None   Collection Time    12/16/12  4:50 PM      Result Value Range   Alcohol, Ethyl (B) <11  0 - 11 mg/dL   Comment:            LOWEST DETECTABLE LIMIT FOR     SERUM ALCOHOL IS 11 mg/dL     FOR MEDICAL PURPOSES ONLY  URINE RAPID DRUG SCREEN (HOSP PERFORMED)     Status: None   Collection Time    12/16/12  5:58 PM      Result Value Range   Opiates NONE DETECTED  NONE DETECTED   Cocaine NONE DETECTED  NONE DETECTED   Benzodiazepines NONE DETECTED  NONE DETECTED   Amphetamines NONE DETECTED  NONE DETECTED   Tetrahydrocannabinol NONE DETECTED  NONE DETECTED   Barbiturates NONE DETECTED  NONE DETECTED   Comment:            DRUG SCREEN FOR MEDICAL PURPOSES     ONLY.  IF CONFIRMATION IS NEEDED     FOR ANY PURPOSE, NOTIFY LAB     WITHIN 5 DAYS.                LOWEST DETECTABLE LIMITS     FOR URINE DRUG SCREEN     Drug Class       Cutoff (ng/mL)     Amphetamine      1000     Barbiturate      200     Benzodiazepine   200     Tricyclics       300     Opiates          300     Cocaine          300  THC              50     Current Facility-Administered Medications  Medication Dose Route Frequency Provider Last Rate Last Dose  . acetaminophen (TYLENOL) tablet 650 mg  650 mg Oral Q4H PRN Tatyana A Kirichenko, PA-C      . albuterol (PROVENTIL HFA;VENTOLIN HFA) 108 (90 BASE) MCG/ACT inhaler 2 puff  2 puff Inhalation QID Tatyana A Kirichenko, PA-C   2 puff at 12/17/12 1110  . buPROPion (WELLBUTRIN XL) 24 hr tablet 300 mg  300 mg Oral Daily Tatyana A  Kirichenko, PA-C   300 mg at 12/17/12 1101  . escitalopram (LEXAPRO) tablet 20 mg  20 mg Oral Daily Tatyana A Kirichenko, PA-C   20 mg at 12/17/12 1101  . ibuprofen (ADVIL,MOTRIN) tablet 600 mg  600 mg Oral Q8H PRN Tatyana A Kirichenko, PA-C      . LORazepam (ATIVAN) tablet 1 mg  1 mg Oral Q8H PRN Tatyana A Kirichenko, PA-C      . nicotine (NICODERM CQ - dosed in mg/24 hours) patch 21 mg  21 mg Transdermal Daily Tatyana A Kirichenko, PA-C      . ondansetron (ZOFRAN) tablet 4 mg  4 mg Oral Q8H PRN Tatyana A Kirichenko, PA-C      . zolpidem (AMBIEN) tablet 5 mg  5 mg Oral QHS PRN Tatyana A Kirichenko, PA-C       Current Outpatient Prescriptions  Medication Sig Dispense Refill  . buPROPion (WELLBUTRIN XL) 300 MG 24 hr tablet Take 1 tablet (300 mg total) by mouth daily. For depression  30 tablet  0  . escitalopram (LEXAPRO) 20 MG tablet Take 1 tablet (20 mg total) by mouth daily. For depression  30 tablet  0  . albuterol (PROVENTIL HFA;VENTOLIN HFA) 108 (90 BASE) MCG/ACT inhaler Inhale 2 puffs into the lungs 4 (four) times daily.  1 Inhaler  0  . Dimethyl Fumarate (TECFIDERA) 240 MG CPDR Take 1 capsule (240 mg total) by mouth 2 (two) times daily.  60 capsule  0    Psychiatric Specialty Exam:     Blood pressure 102/69, pulse 62, temperature 98.7 F (37.1 C), temperature source Oral, resp. rate 20, SpO2 94.00%.There is no weight on file to calculate BMI.  General Appearance: Casual and Fairly Groomed  Patent attorney::  Good  Speech:  Clear and Coherent and Normal Rate  Volume:  Normal  Mood:  Depressed  Affect:  Appropriate  Thought Process:  Circumstantial  Orientation:  Full (Time, Place, and Person)  Thought Content:  Rumination  Suicidal Thoughts:  No  Homicidal Thoughts:  No  Memory:  Immediate;   Good Recent;   Good Remote;   Good  Judgement:  Fair  Insight:  Fair  Psychomotor Activity:  Normal  Concentration:  Fair  Recall:  Good  Akathisia:  No  Handed:  Right  AIMS (if  indicated):     Assets:  Communication Skills Desire for Improvement Housing Social Support Transportation  Sleep:      Treatment Plan Summary: Resend IVC; Discharge home to follow up with outpatient services.    Disposition:  Discharge to [facility]. Patient to follow up primary and [facility]  Assunta Found, FNP  12/17/2012 1:10 PM  I agreed with the findings, treatment and disposition plan of this patient. Kathryne Sharper, MD

## 2012-12-17 NOTE — [facility] Counselor (Signed)
Writer consulted with NP and ER MD regarding the patient not meeting criteria for IVC and inpatient hospitalization.  ER MD signed the notice to commitment change.  Writer faxed the signed document to the magistrate.  Writer gave the form to the nurse working with the patient.  Writer informed the nurse working with the patient that his group home [facility]) [phone].    Writer informed that he will be discharged from the ER.  Writer has coordinated with the group home and he will be picked up between 3 and 5pm.    Writer also spoke with the patients CST Team Elvina Sidle [phone]) and the program will be following up with the patient tomorrow.

## 2012-12-17 NOTE — ED Provider Notes (Signed)
Medical screening examination/treatment/procedure(s) were performed by non-physician practitioner and as supervising physician I was immediately available for consultation/collaboration.  Acelin Ferdig F Gisel Vipond, MD 12/17/12 0827 

## 2012-12-17 NOTE — ED Provider Notes (Signed)
Behavioral health has rescheduled in about 3 commitment and made recommendations for outpatient followup. I did evaluate the patient prior to discharge. Patient is awake, alert, calm and cooperative. He denies any thoughts of harming himself or others. He has followup with therapist this week. He contracts for safety and agrees to return to the ER if he has any further problems.  Gilda Crease, MD 12/17/12 [phone]

## 2013-07-06 ENCOUNTER — Emergency Department (HOSPITAL_COMMUNITY)
Admission: EM | Admit: 2013-07-06 | Discharge: 2013-07-07 | Disposition: A | Discharge status: Home / Self Care | Payer: Medicare HMO | Attending: Emergency Medicine | Admitting: Emergency Medicine

## 2013-07-06 ENCOUNTER — Encounter (HOSPITAL_COMMUNITY): Payer: Self-pay | Admitting: Emergency Medicine

## 2013-07-06 DIAGNOSIS — Z8669 Personal history of other diseases of the nervous system and sense organs: Secondary | ICD-10-CM | POA: Insufficient documentation

## 2013-07-06 DIAGNOSIS — F17200 Nicotine dependence, unspecified, uncomplicated: Secondary | ICD-10-CM | POA: Insufficient documentation

## 2013-07-06 DIAGNOSIS — F1210 Cannabis abuse, uncomplicated: Secondary | ICD-10-CM | POA: Insufficient documentation

## 2013-07-06 DIAGNOSIS — F341 Dysthymic disorder: Secondary | ICD-10-CM

## 2013-07-06 DIAGNOSIS — R45851 Suicidal ideations: Secondary | ICD-10-CM | POA: Insufficient documentation

## 2013-07-06 DIAGNOSIS — Z79899 Other long term (current) drug therapy: Secondary | ICD-10-CM | POA: Insufficient documentation

## 2013-07-06 LAB — CBC
HCT: 44.4 % (ref 39.0–52.0)
Hemoglobin: 15 g/dL (ref 13.0–17.0)
MCH: 30.9 pg (ref 26.0–34.0)
MCHC: 33.8 g/dL (ref 30.0–36.0)
MCV: 91.4 fL (ref 78.0–100.0)
Platelets: 214 10*3/uL (ref 150–400)
RBC: 4.86 MIL/uL (ref 4.22–5.81)
RDW: 15 % (ref 11.5–15.5)
WBC: 6.1 10*3/uL (ref 4.0–10.5)

## 2013-07-06 LAB — COMPREHENSIVE METABOLIC PANEL
ALT: 17 U/L (ref 0–53)
AST: 20 U/L (ref 0–37)
Albumin: 3.9 g/dL (ref 3.5–5.2)
Alkaline Phosphatase: 69 U/L (ref 39–117)
BUN: 13 mg/dL (ref 6–23)
CO2: 19 mEq/L (ref 19–32)
Calcium: 8.9 mg/dL (ref 8.4–10.5)
Chloride: 101 mEq/L (ref 96–112)
Creatinine, Ser: 0.99 mg/dL (ref 0.50–1.35)
GFR calc Af Amer: >90 mL/min (ref >90)
GFR calc non Af Amer: 88 mL/min — ABNORMAL LOW (ref >90)
Glucose, Bld: 55 mg/dL — ABNORMAL LOW (ref 70–99)
Potassium: 4 mEq/L (ref 3.7–5.3)
Sodium: 139 mEq/L (ref 137–147)
Total Bilirubin: <0.2 mg/dL — ABNORMAL LOW (ref 0.3–1.2)
Total Protein: 7.1 g/dL (ref 6.0–8.3)

## 2013-07-06 LAB — RAPID URINE DRUG SCREEN, HOSP PERFORMED
Amphetamines: NOT DETECTED
Barbiturates: NOT DETECTED
Benzodiazepines: NOT DETECTED
Cocaine: NOT DETECTED
Opiates: NOT DETECTED
Tetrahydrocannabinol: POSITIVE — AB

## 2013-07-06 LAB — ETHANOL: Alcohol, Ethyl (B): 121 mg/dL — ABNORMAL HIGH (ref 0–11)

## 2013-07-06 LAB — SALICYLATE LEVEL: Salicylate Lvl: <2 mg/dL — ABNORMAL LOW (ref 2.8–20.0)

## 2013-07-06 LAB — ACETAMINOPHEN LEVEL: Acetaminophen (Tylenol), Serum: <15 ug/mL (ref 10–30)

## 2013-07-06 MED ORDER — ESCITALOPRAM OXALATE 10 MG PO TABS
20.0000 mg | ORAL_TABLET | Freq: Every day | ORAL | Status: DC
  Administered 2013-07-07: 20 mg via ORAL
  Filled 2013-07-06: qty 2

## 2013-07-06 MED ORDER — LORAZEPAM 1 MG PO TABS: 0.0000 mg | ORAL_TABLET | Freq: Four times a day (QID) | ORAL | Status: DC

## 2013-07-06 MED ORDER — DIMETHYL FUMARATE 240 MG PO CPDR
240.0000 mg | DELAYED_RELEASE_CAPSULE | Freq: Two times a day (BID) | ORAL | Status: DC
  Administered 2013-07-07: 240 mg via ORAL

## 2013-07-06 MED ORDER — BUPROPION HCL ER (XL) 300 MG PO TB24
300.0000 mg | ORAL_TABLET | Freq: Every day | ORAL | Status: DC
  Administered 2013-07-07: 300 mg via ORAL
  Filled 2013-07-06: qty 1

## 2013-07-06 MED ORDER — IBUPROFEN 200 MG PO TABS: 600.0000 mg | ORAL_TABLET | Freq: Three times a day (TID) | ORAL | Status: DC | PRN

## 2013-07-06 MED ORDER — NICOTINE 21 MG/24HR TD PT24: 21.0000 mg | MEDICATED_PATCH | Freq: Every day | TRANSDERMAL | Status: DC

## 2013-07-06 MED ORDER — ZOLPIDEM TARTRATE 5 MG PO TABS: 5.0000 mg | ORAL_TABLET | Freq: Every evening | ORAL | Status: DC | PRN

## 2013-07-06 MED ORDER — LORAZEPAM 1 MG PO TABS: 0.0000 mg | ORAL_TABLET | Freq: Two times a day (BID) | ORAL | Status: DC

## 2013-07-06 MED ORDER — ONDANSETRON HCL 4 MG PO TABS: 4.0000 mg | ORAL_TABLET | Freq: Three times a day (TID) | ORAL | Status: DC | PRN

## 2013-07-06 NOTE — ED Notes (Signed)
Per PT, brought in by [employer].  Pt has been depressed.  Pt has called mobile crisis with [facility].  No plan at this time.  Pt states previous attempt in the past.  Pt denies pills today.  Pt denies HI.

## 2013-07-06 NOTE — ED Provider Notes (Signed)
CSN: [id number]     Arrival date & time 07/06/13  1752 History   First MD Initiated Contact with Patient 07/06/13 2128     Chief Complaint  Patient presents with  . Medical Clearance     (Consider location/radiation/quality/duration/timing/severity/associated sxs/prior Treatment) HPI [name] is a 58 y.o. male who presents to emergency department by [employer] for depression and suicidal thoughts. Patient states he has had depression "most of my life." He states that his symptoms worsened in the last several days. He states he called mobile crisis today complaining of suicidal thoughts. He denies any plan. He states he has had suicidal thoughts with attempts in the past. He denies taking any overdoses today for having any blood products. He reports taking his regular medications regularly. Pt states "I am just ready for all this to end." Admits to alcohol, denies any drugs.   Past Medical History  Diagnosis Date  . Mental disorder   . Depression   . Neuromuscular disorder   . Multiple sclerosis, relapsing-remitting   . MS (multiple sclerosis)    Past Surgical History  Procedure Laterality Date  . Tonsillectomy     History reviewed. No pertinent family history. History  Substance Use Topics  . Smoking status: Current Every Day Smoker -- 0.50 packs/day for 36 years    Types: Cigarettes  . Smokeless tobacco: Never Used  . Alcohol Use: Yes     Comment: "hardly drink except when I get into a hotel room, probably every 6 months or so"    Review of Systems  Constitutional: Negative for fever and chills.  Respiratory: Negative for cough, chest tightness and shortness of breath.   Cardiovascular: Negative for chest pain, palpitations and leg swelling.  Genitourinary: Negative for dysuria.  Skin: Negative for rash.  Allergic/Immunologic: Negative for immunocompromised state.  Neurological: Negative for dizziness, weakness, light-headedness, numbness and headaches.   Psychiatric/Behavioral: Positive for suicidal ideas. The patient is nervous/anxious.   All other systems reviewed and are negative.      Allergies  Review of patient's allergies indicates no known allergies.  Home Medications   Current Outpatient Rx  Name  Route  Sig  Dispense  Refill  . buPROPion (WELLBUTRIN XL) 300 MG 24 hr tablet   Oral   Take 1 tablet (300 mg total) by mouth daily. For depression   30 tablet   0   . Dimethyl Fumarate (TECFIDERA) 240 MG CPDR   Oral   Take 1 capsule (240 mg total) by mouth 2 (two) times daily.   60 capsule   0   . escitalopram (LEXAPRO) 20 MG tablet   Oral   Take 1 tablet (20 mg total) by mouth daily. For depression   30 tablet   0    There were no vitals taken for this visit. Physical Exam  Nursing note and vitals reviewed. Constitutional: He is oriented to person, place, and time. He appears well-developed and well-nourished. No distress.  HENT:  Head: Normocephalic and atraumatic.  Eyes: Conjunctivae are normal.  Neck: Neck supple.  Cardiovascular: Normal rate, regular rhythm and normal heart sounds.   Pulmonary/Chest: Effort normal. No respiratory distress. He has no wheezes. He has no rales.  Abdominal: Soft. Bowel sounds are normal. He exhibits no distension. There is no tenderness. There is no rebound.  Musculoskeletal: He exhibits no edema.  Neurological: He is alert and oriented to person, place, and time.  Skin: Skin is warm and dry.  Psychiatric: He has a normal  mood and affect. Thought content normal.    ED Course  Procedures (including critical care time) Labs Review Labs Reviewed  COMPREHENSIVE METABOLIC PANEL - Abnormal; Notable for the following:    Glucose, Bld 55 (*)    Total Bilirubin <0.2 (*)    GFR calc non Af Amer 88 (*)    All other components within normal limits  ETHANOL - Abnormal; Notable for the following:    Alcohol, Ethyl (B) 121 (*)    All other components within normal limits   SALICYLATE LEVEL - Abnormal; Notable for the following:    Salicylate Lvl <2.0 (*)    All other components within normal limits  URINE RAPID DRUG SCREEN (HOSP PERFORMED) - Abnormal; Notable for the following:    Tetrahydrocannabinol POSITIVE (*)    All other components within normal limits  ACETAMINOPHEN LEVEL  CBC   Imaging Review No results found.   EKG Interpretation None      MDM   Final diagnoses:  None    9:44 PM Pt seen and examined. Pt with depression and suicidal ideations. No plan. Will get TTS to assess. Medically cleared.  Holding orders placed. Pt with SI, no HI. Here voluntarily       Lottie Musselatyana A Azekiel Cremer, PA-C 07/07/13 0124

## 2013-07-07 NOTE — Consult Note (Signed)
[facility] Face-to-Face Psychiatry Consult   Reason for Consult:  Depression with SI Referring Physician:  EDMD [name] is an 58 y.o. male. Total Time spent with patient: 30 minutes  Assessment: AXIS I:  Bipolar, Depressed AXIS II:  Deferred AXIS III:   Past Medical History  Diagnosis Date  . Mental disorder   . Depression   . Neuromuscular disorder   . Multiple sclerosis, relapsing-remitting   . MS (multiple sclerosis)    AXIS IV:  problems related to social environment AXIS V:  51-60 moderate symptoms  Plan:  No evidence of imminent risk to self or others at present.    Subjective:   [name] is a 58 y.o. male patient admitted with reports of depression worsening over the past 18 hours. He reports a life long history of depression. He is currently followed by his Primary Care Provider. Today he states he is not suicidal and is ready to go home.  HPI:  Patient presented yesterday reporting that he had had worsening depression over the past 18 hours. He did see his PCP yesterday who manages his psychiatric medications, but did not report any symptoms at that time. He has had no medication changes.  He reports his depression worsened through out the day but is unable to note any specific symptoms of depression that made him feel worse. He does admit to smoking marijuana on occasion, and had a few beers yesterday.  He was followed at [facility] but doesn't like going there.  He has had suicide attempts in the past, but states this morning he is not suicidal and does not have a plan. HPI Elements:   Location:  [facility]. Quality:  chronic. Severity:  mild. Timing:  started yesterday. Duration:  18 hours. Context:  patient notes that he just moved out of a group home in the last month, and lives with a roommate. 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  Past Psychiatric History: Past Medical History  Diagnosis Date  . Mental disorder   . Depression   .  Neuromuscular disorder   . Multiple sclerosis, relapsing-remitting   . MS (multiple sclerosis)     reports that he has been smoking Cigarettes.  He has a 18 pack-year smoking history. He has never used smokeless tobacco. He reports that he drinks alcohol. He reports that he does not use illicit drugs. History reviewed. No pertinent family history. Family History Substance Abuse: No Family Supports: Yes, List: Living Arrangements: Other (Comment) Can pt return to current living arrangement?: Yes (Pt has a room mate)   Allergies:  No Known Allergies  ACT Assessment Complete:  Yes:    Educational Status    Risk to Self: Risk to self Suicidal Ideation: Yes-Currently Present Suicidal Intent: Yes-Currently Present Is patient at risk for suicide?: Yes Suicidal Plan?: No-Not Currently/Within Last 6 Months Specify Current Suicidal Plan: None Reported Access to Means: Yes Specify Access to Suicidal Means: Pills  What has been your use of drugs/alcohol within the last 12 months?: NA Previous Attempts/Gestures: Yes How many times?: 25 Other Self Harm Risks: NA Triggers for Past Attempts: Unpredictable Intentional Self Injurious Behavior: None Family Suicide History: No Recent stressful life event(s): Conflict (Comment);Job Loss;Financial Problems;Recent negative physical changes (dIAGNOSED WITH ms IN 2000) Persecutory voices/beliefs?: No Depression: Yes Depression Symptoms: Despondent;Insomnia;Isolating;Fatigue;Guilt;Loss of interest in usual pleasures;Feeling worthless/self pity;Feeling angry/irritable Substance abuse history and/or treatment for substance abuse?: Yes Suicide prevention information given to non-admitted patients: Yes  Risk to Others: Risk to Others Homicidal Ideation: No Thoughts of Harm to Others: No Current Homicidal Intent: No Current Homicidal Plan: No Access to Homicidal Means: No Identified Victim: NA History of harm to others?: No Assessment of Violence: None  Noted Violent Behavior Description: NA Does patient have access to weapons?: No Criminal Charges Pending?: No Does patient have a court date: No  Abuse:    Prior Inpatient Therapy: Prior Inpatient Therapy Prior Inpatient Therapy: Yes Prior Therapy Dates: 2014 Prior Therapy Facilty/Provider(s): [facility] Reason for Treatment: SI  Prior Outpatient Therapy: Prior Outpatient Therapy Prior Outpatient Therapy: Yes Prior Therapy Dates: Ongoing  Prior Therapy Facilty/Provider(s): [facility]  Reason for Treatment: Med Mgt and Outpatient Therapy  Additional Information: Additional Information 1:1 In Past 12 Months?: No CIRT Risk: No Elopement Risk: No Does patient have medical clearance?: Yes                  Objective: Blood pressure 121/69, pulse 86, temperature 97.7 F (36.5 C), temperature source Oral, resp. rate 18, SpO2 95.00%.There is no weight on file to calculate BMI. Results for orders placed during the hospital encounter of 07/06/13 (from the past 72 hour(s))  ACETAMINOPHEN LEVEL     Status: None   Collection Time    07/06/13  7:01 PM      Result Value Ref Range   Acetaminophen (Tylenol), Serum <15.0  10 - 30 ug/mL   Comment:            THERAPEUTIC CONCENTRATIONS VARY     SIGNIFICANTLY. A RANGE OF 10-30     ug/mL MAY BE AN EFFECTIVE     CONCENTRATION FOR MANY PATIENTS.     HOWEVER, SOME ARE BEST TREATED     AT CONCENTRATIONS OUTSIDE THIS     RANGE.     ACETAMINOPHEN CONCENTRATIONS     >150 ug/mL AT 4 HOURS AFTER     INGESTION AND >50 ug/mL AT 12     HOURS AFTER INGESTION ARE     OFTEN ASSOCIATED WITH TOXIC     REACTIONS.  CBC     Status: None   Collection Time    07/06/13  7:01 PM      Result Value Ref Range   WBC 6.1  4.0 - 10.5 K/uL   RBC 4.86  4.22 - 5.81 MIL/uL   Hemoglobin 15.0  13.0 - 17.0 g/dL   HCT 44.4  39.0 - 52.0 %   MCV 91.4  78.0 - 100.0 fL   MCH 30.9  26.0 - 34.0 pg   MCHC 33.8  30.0 - 36.0 g/dL   RDW 15.0  11.5 - 15.5 %    Platelets 214  150 - 400 K/uL  COMPREHENSIVE METABOLIC PANEL     Status: Abnormal   Collection Time    07/06/13  7:01 PM      Result Value Ref Range   Sodium 139  137 - 147 mEq/L   Potassium 4.0  3.7 - 5.3 mEq/L   Chloride 101  96 - 112 mEq/L   CO2 19  19 - 32 mEq/L   Glucose, Bld 55 (*) 70 - 99 mg/dL   BUN 13  6 - 23 mg/dL   Creatinine, Ser 0.99  0.50 - 1.35 mg/dL   Calcium 8.9  8.4 - 10.5 mg/dL   Total Protein 7.1  6.0 - 8.3 g/dL   Albumin 3.9  3.5 - 5.2 g/dL   AST 20  0 - 37 U/L  ALT 17  0 - 53 U/L   Alkaline Phosphatase 69  39 - 117 U/L   Total Bilirubin <0.2 (*) 0.3 - 1.2 mg/dL   GFR calc non Af Amer 88 (*) >90 mL/min   GFR calc Af Amer >90  >90 mL/min   Comment: (NOTE)     The eGFR has been calculated using the CKD EPI equation.     This calculation has not been validated in all clinical situations.     eGFR's persistently <90 mL/min signify possible Chronic Kidney     Disease.  ETHANOL     Status: Abnormal   Collection Time    07/06/13  7:01 PM      Result Value Ref Range   Alcohol, Ethyl (B) 121 (*) 0 - 11 mg/dL   Comment:            LOWEST DETECTABLE LIMIT FOR     SERUM ALCOHOL IS 11 mg/dL     FOR MEDICAL PURPOSES ONLY  SALICYLATE LEVEL     Status: Abnormal   Collection Time    07/06/13  7:01 PM      Result Value Ref Range   Salicylate Lvl <7.6 (*) 2.8 - 20.0 mg/dL  URINE RAPID DRUG SCREEN (HOSP PERFORMED)     Status: Abnormal   Collection Time    07/06/13  7:04 PM      Result Value Ref Range   Opiates NONE DETECTED  NONE DETECTED   Cocaine NONE DETECTED  NONE DETECTED   Benzodiazepines NONE DETECTED  NONE DETECTED   Amphetamines NONE DETECTED  NONE DETECTED   Tetrahydrocannabinol POSITIVE (*) NONE DETECTED   Barbiturates NONE DETECTED  NONE DETECTED   Comment:            DRUG SCREEN FOR MEDICAL PURPOSES     ONLY.  IF CONFIRMATION IS NEEDED     FOR ANY PURPOSE, NOTIFY LAB     WITHIN 5 DAYS.                LOWEST DETECTABLE LIMITS     FOR URINE  DRUG SCREEN     Drug Class       Cutoff (ng/mL)     Amphetamine      1000     Barbiturate      200     Benzodiazepine   811     Tricyclics       572     Opiates          300     Cocaine          300     THC              50   Labs are reviewed and are pertinent for positive THC and BAL of 121.  Current Facility-Administered Medications  Medication Dose Route Frequency Provider Last Rate Last Dose  . buPROPion (WELLBUTRIN XL) 24 hr tablet 300 mg  300 mg Oral Daily Tatyana A Kirichenko, PA-C   300 mg at 07/07/13 0912  . Dimethyl Fumarate CPDR 240 mg  240 mg Oral BID Tatyana A Kirichenko, PA-C   240 mg at 07/07/13 0913  . escitalopram (LEXAPRO) tablet 20 mg  20 mg Oral Daily Tatyana A Kirichenko, PA-C   20 mg at 07/07/13 0912  . ibuprofen (ADVIL,MOTRIN) tablet 600 mg  600 mg Oral Q8H PRN Tatyana A Kirichenko, PA-C      . LORazepam (ATIVAN) tablet 0-4 mg  0-4 mg Oral  4 times per day Tatyana A Kirichenko, PA-C       Followed by  . [START ON 07/09/2013] LORazepam (ATIVAN) tablet 0-4 mg  0-4 mg Oral Q12H Tatyana A Kirichenko, PA-C      . nicotine (NICODERM CQ - dosed in mg/24 hours) patch 21 mg  21 mg Transdermal Daily Tatyana A Kirichenko, PA-C      . ondansetron (ZOFRAN) tablet 4 mg  4 mg Oral Q8H PRN Tatyana A Kirichenko, PA-C      . zolpidem (AMBIEN) tablet 5 mg  5 mg Oral QHS PRN Tatyana A Kirichenko, PA-C       Current Outpatient Prescriptions  Medication Sig Dispense Refill  . buPROPion (WELLBUTRIN XL) 300 MG 24 hr tablet Take 1 tablet (300 mg total) by mouth daily. For depression  30 tablet  0  . Dimethyl Fumarate (TECFIDERA) 240 MG CPDR Take 1 capsule (240 mg total) by mouth 2 (two) times daily.  60 capsule  0  . escitalopram (LEXAPRO) 20 MG tablet Take 1 tablet (20 mg total) by mouth daily. For depression  30 tablet  0    Psychiatric Specialty Exam:     Blood pressure 121/69, pulse 86, temperature 97.7 F (36.5 C), temperature source Oral, resp. rate 18, SpO2 95.00%.There is no  weight on file to calculate BMI.  General Appearance: Casual  Eye Contact::  Good  Speech:  Clear and Coherent  Volume:  Normal  Mood:  Euthymic  Affect:  Congruent  Thought Process:  Goal Directed  Orientation:  Full (Time, Place, and Person)  Thought Content:  WDL  Suicidal Thoughts:  No  Homicidal Thoughts:  No  Memory:  NA  Judgement:  Fair  Insight:  Present  Psychomotor Activity:  Normal  Concentration:  Fair  Recall:  [place] of Knowledge:Fair  Language: Good  Akathisia:  No  Handed:  Right  AIMS (if indicated):     Assets:  Communication Skills Desire for Improvement Housing Resilience Social Support  Sleep:      Musculoskeletal: Strength & Muscle Tone: within normal limits Gait & Station:  Patient leans: N/A  Treatment Plan Summary: Medication management D/C home to follow up with his PCP.  Catcher [name] 07/07/2013 12:30 PM

## 2013-07-07 NOTE — ED Notes (Signed)
Bed: WA21 Expected date:  Expected time:  Means of arrival:  Comments: 

## 2013-07-07 NOTE — Discharge Instructions (Signed)
1. Avoid alcohol and marijuana use. 2. Follow up with PCP as planned. 3. Continue with Medications as planned. 4. Recommended asking PCP to order CST or ACT team for support and therapy.

## 2013-07-07 NOTE — [facility] Suicide Risk Assessment (Cosign Needed)
Suicide Risk Assessment  Discharge Assessment     Demographic Factors:  Male  Total Time spent with patient: 20 minutes  Psychiatric Specialty Exam:     Blood pressure 121/69, pulse 86, temperature 97.7 F (36.5 C), temperature source Oral, resp. rate 18, SpO2 95.00%.There is no weight on file to calculate BMI.  Psychiatric Specialty Exam:      Blood pressure 121/69, pulse 86, temperature 97.7 F (36.5 C), temperature source Oral, resp. rate 18, SpO2 95.00%.There is no weight on file to calculate BMI.   General Appearance: Casual   Eye Contact:: Good   Speech: Clear and Coherent   Volume: Normal   Mood: Euthymic   Affect: Congruent   Thought Process: Goal Directed   Orientation: Full (Time, Place, and Person)   Thought Content: WDL   Suicidal Thoughts: No   Homicidal Thoughts: No   Memory: NA   Judgement: Fair   Insight: Present   Psychomotor Activity: Normal   Concentration: Fair   Recall: Fair   Fund of Knowledge:Fair   Language: Good   Akathisia: No   Handed: Right   AIMS (if indicated):   Assets: Communication Skills  Desire for Improvement  Housing  Resilience  Social Support   Sleep:       Musculoskeletal: Strength & Muscle Tone: within normal limits Gait & Station: normal Patient leans: N/A   Mental Status Per Nursing Assessment::   On Admission:     Current Mental Status by Physician: Suicide ideation indicated by: Patient Denies Loss Factors: none  Historical Factors: Prior suicide attempts  Risk Reduction Factors:   Living with another person, especially a relative, Positive social support and Positive coping skills or problem solving skills  Continued Clinical Symptoms:  Depression: Denies any symptoms currently  Cognitive Features That Contribute To Risk:  Closed-mindedness    Suicide Risk:  Minimal: No identifiable suicidal ideation.  Patients presenting with no risk factors but with morbid ruminations; may be classified as  minimal risk based on the severity of the depressive symptoms  Discharge Diagnoses:   AXIS I:  Bipolar, Depressed AXIS II:  Deferred AXIS III:   Past Medical History  Diagnosis Date  . Mental disorder   . Depression   . Neuromuscular disorder   . Multiple sclerosis, relapsing-remitting   . MS (multiple sclerosis)    AXIS IV:  problems related to social environment AXIS V:  61-70 mild symptoms  Plan Of Care/Follow-up recommendations:  Activity:  as tolerated Diet:  heart healthy Tests:  none Other:  follow up with PCP as written  Is patient on multiple antipsychotic therapies at discharge:  No   Has Patient had three or more failed trials of antipsychotic monotherapy by history:  No  Recommended Plan for Multiple Antipsychotic Therapies: NA   Lloyd Huger T. Laraine Samet [facility] 07/07/2013 12:50 PM

## 2013-07-07 NOTE — Progress Notes (Signed)
Per discussion with psychiatrist and NP, patient psychiatrically stable for dc home. Pt provided outpatient resources for therapy as recommended. Pt plans to call room mate for ride home.   Byrd Hesselbach [phone]  ED CSW 07/07/2013 1249pm

## 2013-07-08 ENCOUNTER — Other Ambulatory Visit (HOSPITAL_COMMUNITY): Payer: Self-pay | Admitting: Cardiology

## 2013-07-08 ENCOUNTER — Ambulatory Visit (HOSPITAL_COMMUNITY)
Admission: RE | Admit: 2013-07-08 | Discharge: 2013-07-08 | Disposition: A | Discharge status: Home / Self Care | Payer: Medicare HMO | Source: Ambulatory Visit | Attending: Cardiology | Admitting: Cardiology

## 2013-07-08 DIAGNOSIS — T148XXA Other injury of unspecified body region, initial encounter: Secondary | ICD-10-CM

## 2013-07-08 DIAGNOSIS — M7989 Other specified soft tissue disorders: Secondary | ICD-10-CM | POA: Insufficient documentation

## 2013-07-08 LAB — COMPREHENSIVE METABOLIC PANEL
ALT: 18 U/L (ref 0–53)
AST: 19 U/L (ref 0–37)
Albumin: 4.1 g/dL (ref 3.5–5.2)
Alkaline Phosphatase: 72 U/L (ref 39–117)
BUN: 14 mg/dL (ref 6–23)
CO2: 23 mEq/L (ref 19–32)
Calcium: 9.1 mg/dL (ref 8.4–10.5)
Chloride: 102 mEq/L (ref 96–112)
Creatinine, Ser: 0.94 mg/dL (ref 0.50–1.35)
GFR calc Af Amer: >90 mL/min (ref >90)
GFR calc non Af Amer: >90 mL/min (ref >90)
Glucose, Bld: 99 mg/dL (ref 70–99)
Potassium: 4.2 mEq/L (ref 3.7–5.3)
Sodium: 140 mEq/L (ref 137–147)
Total Bilirubin: <0.2 mg/dL — ABNORMAL LOW (ref 0.3–1.2)
Total Protein: 7.5 g/dL (ref 6.0–8.3)

## 2013-07-08 LAB — LIPID PANEL
Cholesterol: 261 mg/dL — ABNORMAL HIGH (ref 0–200)
HDL: 53 mg/dL (ref >39)
LDL Cholesterol: 170 mg/dL — ABNORMAL HIGH (ref 0–99)
Total CHOL/HDL Ratio: 4.9 RATIO
Triglycerides: 188 mg/dL — ABNORMAL HIGH (ref <150)
VLDL: 38 mg/dL (ref 0–40)

## 2013-07-08 LAB — CBC
HCT: 47.2 % (ref 39.0–52.0)
Hemoglobin: 15.9 g/dL (ref 13.0–17.0)
MCH: 31.5 pg (ref 26.0–34.0)
MCHC: 33.7 g/dL (ref 30.0–36.0)
MCV: 93.7 fL (ref 78.0–100.0)
Platelets: 197 10*3/uL (ref 150–400)
RBC: 5.04 MIL/uL (ref 4.22–5.81)
RDW: 15.4 % (ref 11.5–15.5)
WBC: 5.8 10*3/uL (ref 4.0–10.5)

## 2013-07-08 LAB — URINALYSIS, ROUTINE W REFLEX MICROSCOPIC
Bilirubin Urine: NEGATIVE
Glucose, UA: NEGATIVE mg/dL
Hgb urine dipstick: NEGATIVE
Ketones, ur: NEGATIVE mg/dL
Leukocytes, UA: NEGATIVE
Nitrite: NEGATIVE
Protein, ur: NEGATIVE mg/dL
Specific Gravity, Urine: 1.012 (ref 1.005–1.030)
Urobilinogen, UA: 0.2 mg/dL (ref 0.0–1.0)
pH: 5.5 (ref 5.0–8.0)

## 2013-07-08 LAB — PSA: PSA: 1.51 ng/mL (ref <=4.00)

## 2013-07-14 NOTE — Consult Note (Signed)
Face to face evaluation and I agree with this note 

## 2013-07-16 NOTE — ED Provider Notes (Signed)
Medical screening examination/treatment/procedure(s) were performed by non-physician practitioner and as supervising physician I was immediately available for consultation/collaboration.  Gavin Pound. Caryle Helgeson, MD 07/16/13 1545

## 2014-06-29 ENCOUNTER — Encounter: Payer: Self-pay | Admitting: Neurology

## 2014-06-29 ENCOUNTER — Ambulatory Visit (INDEPENDENT_AMBULATORY_CARE_PROVIDER_SITE_OTHER): Payer: Medicare HMO | Admitting: Neurology

## 2014-06-29 VITALS — BP 128/78 | HR 74 | Resp 16 | Ht 71.0 in | Wt 183.8 lb

## 2014-06-29 DIAGNOSIS — R5383 Other fatigue: Secondary | ICD-10-CM | POA: Diagnosis not present

## 2014-06-29 DIAGNOSIS — G35 Multiple sclerosis: Secondary | ICD-10-CM | POA: Diagnosis not present

## 2014-06-29 DIAGNOSIS — R3915 Urgency of urination: Secondary | ICD-10-CM | POA: Diagnosis not present

## 2014-06-29 DIAGNOSIS — Z79899 Other long term (current) drug therapy: Secondary | ICD-10-CM | POA: Diagnosis not present

## 2014-06-29 DIAGNOSIS — F332 Major depressive disorder, recurrent severe without psychotic features: Secondary | ICD-10-CM | POA: Diagnosis not present

## 2014-06-29 DIAGNOSIS — F329 Major depressive disorder, single episode, unspecified: Secondary | ICD-10-CM

## 2014-06-29 DIAGNOSIS — R269 Unspecified abnormalities of gait and mobility: Secondary | ICD-10-CM | POA: Diagnosis not present

## 2014-06-29 NOTE — Progress Notes (Signed)
[facility]  PATIENT: [name] DOB: September 30, 1955  REFERRING DOCTOR OR PCP: Dr.  Shana Chute SOURCE: patient and records from [facility]  _________________________________   HISTORICAL  CHIEF COMPLAINT:  Chief Complaint  Patient presents with  . Multiple Sclerosis    Sts. he tolerates Tecfidera well.  Denies new or worsening sx./fim    HISTORY OF PRESENT ILLNESS:  [name] is a 59 year old man with relapsing remitting multiple sclerosis and bipolar disease.    He presented with double vision in 2000.  MRI at that time showed lesions in the brain consistent with multiple sclerosis. He also had a lumbar puncture and the CSF was consistent with MS.   He was started on Avonex.  He had another episode of diplopia followed by weakness in both legs 3 years later.    He was switched to Copaxone for a while. Unfortunate, he had an another severe exacerbation with his legs this. He was started on Tysabri once a month.   For several years, he stayed to [place] but stopped as it was difficult for him to get the infusions. In 2011, he switched to Gilenya. 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 He had one exacerbation with weakness in 2012. He then started Tecfidera in late 2013. He has generally tolerated it well. He did not have any significant flushing or GI side effects. He has not had any definite exacerbations while on Tecfidera. His lymphocyte count 04/01/2014 was 1100.   he has several symptoms related to his multiple sclerosis.  Gait/strength/sensation: He has mild weakness in both legs associated with some spasticity. This leads to difficulty getting out of a chair and also causes his gait to be spastic and off balance. 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 He does not note much weakness or numbness in the arms.  Bladder:  Over the past  year, he has noted more urinary urgency. He has not really noted much more frequency. He denies any problems with hesitancy and he feels he empties well. He used to have constipation but over the past few months he has had bowel urgency. He has not had any urinary or fecal incontinence. However, many years ago he did have some episodes of urinary incontinence  Vision:   He notes diplopia when he looks either left or right. There is less likely diplopia when he looks straight ahead. Acuity is fairly symmetric. He denies any eye pain.  Fatigue/sleep: He reports frequent fatigue. This has been a problem for him since diagnosis.  He feels rested when he wakes up but gets more tired as the day goes on. If he rests for a while he feels better. He does not have to take naps. He had received some benefit from amantadine in the past but then he stopped to receive a benefit from it so he stopped the medication.  Mood/cognition: He has bipolar disease and has had multiple inpatient psychiatric admissions with various changes of medications over the years. He's been off lithium since 2011.  Dr. Shana Chute, his primary care Dr., writes his current psych medications.  Other: He reports low back pain that has been mild more recently. Low back hurts more when he coughs.  An MRI report from 05/29/2007 shows extensive periventricular white matter change consistent with the history of multiple sclerosis. When compared to an MRI of the brain dated 07/01/2004,  there was no significant change.    REVIEW OF SYSTEMS: Constitutional: No fevers, chills, sweats, or change in appetite.   Fatigue Eyes: Notes double vision.  No eye pain Ear, nose and throat: No hearing loss, ear pain, nasal congestion, sore throat Cardiovascular: No chest pain, palpitations Respiratory: No shortness of breath at rest or with exertion.   No wheezes.  Occ coughs (smokes) GastrointestinaI: No nausea, vomiting, diarrhea, abdominal pain, fecal  incontinence Genitourinary: No dysuria, urinary retention.  Mild frequency.  No nocturia. Musculoskeletal: No neck pain, mild back pain Integumentary: No rash, pruritus, skin lesions Neurological: as above Psychiatric: Notes depression and anxiety.   He has bipolar disorder Endocrine: No palpitations, diaphoresis, change in appetite, change in weigh or increased thirst Hematologic/Lymphatic: No anemia, purpura, petechiae. Allergic/Immunologic: No itchy/runny eyes, nasal congestion, recent allergic reactions, rashes  ALLERGIES: No Known Allergies  HOME MEDICATIONS:  Current outpatient prescriptions:  .  buPROPion (WELLBUTRIN XL) 300 MG 24 hr tablet, Take 1 tablet (300 mg total) by mouth daily. For depression, Disp: 30 tablet, Rfl: 0 .  Dimethyl Fumarate (TECFIDERA) 240 MG CPDR, Take 1 capsule (240 mg total) by mouth 2 (two) times daily., Disp: 60 capsule, Rfl: 0 .  escitalopram (LEXAPRO) 20 MG tablet, Take 1 tablet (20 mg total) by mouth daily. For depression, Disp: 30 tablet, Rfl: 0  PAST MEDICAL HISTORY: Past Medical History  Diagnosis Date  . Mental disorder   . Depression   . Neuromuscular disorder   . Multiple sclerosis, relapsing-remitting   . MS (multiple sclerosis)   . Movement disorder   . Vision abnormalities     PAST SURGICAL HISTORY: Past Surgical History  Procedure Laterality Date  . Tonsillectomy      FAMILY HISTORY: Family History  Problem Relation Age of Onset  . Heart disease Mother   . Stroke Father     SOCIAL HISTORY:  History   Social History  . Marital Status: Legally Separated    Spouse Name: N/A  . Number of Children: N/A  . Years of Education: N/A   Occupational History  . Not on file.   Social History Main Topics  . Smoking status: Current Every Day Smoker -- 0.50 packs/day for 36 years    Types: Cigarettes  . Smokeless tobacco: Never Used  . Alcohol Use: Yes     Comment: "hardly drink except when I get into a hotel room,  probably every 6 months or so"  . Drug Use: No  . Sexual Activity: Not Currently    Birth Control/ Protection: None   Other Topics Concern  . Not on file   Social History Narrative   ** Merged History Encounter **         PHYSICAL EXAM  Filed Vitals:   06/29/14 0938  BP: 128/78  Pulse: 74  Resp: 16  Height: 5\' 11"  (1.803 m)  Weight: 183 lb 12.8 oz (83.371 kg)    Body mass index is 25.65 kg/(m^2).   General: The patient is well-developed and well-nourished and in no acute distress  Eyes:  Funduscopic exam shows normal optic discs and retinal vessels.  Neck: The neck is supple, no carotid bruits are noted.  The neck is nontender.  Cardiovascular: The heart has a regular rate and rhythm with a normal S1 and S2. There were no murmurs, gallops or rubs. Lungs are clear to auscultation.  Skin: Extremities are without significant edema.  Musculoskeletal:  Back is nontender  Neurologic Exam  Mental status: The patient  is alert and oriented x 3 at the time of the examination. The patient has apparent normal recent and remote memory, with an apparently normal attention span and concentration ability.   Speech is normal.  Cranial nerves: Extraocular movements appear full.He notes diplopia to far gaze left and right.    Pupils are equal, round, and reactive to light and accomodation.  Visual fields are full.  Facial symmetry is present. There is good facial sensation to soft touch bilaterally.Facial strength is normal.  Trapezius and sternocleidomastoid strength is normal. No dysarthria is noted.  The tongue is midline, and the patient has symmetric elevation of the soft palate. No obvious hearing deficits are noted.  Motor:  Muscle bulk is normal.   Tone is increased in legs. Strength is  5 / 5 in all 4 extremities except 4+/5 in hip flexors   Sensory: Sensory testing is intact to pinprick, soft touch and vibration sensation in all 4 extremities.  Coordination: Cerebellar  testing reveals good finger-nose-finger and poor heel-to-shin bilaterally.  Gait and station: Station is wide stanced.   Gait is wide stanced with small stride. Tandem gait requires support. Romberg is negative.   Reflexes: Deep tendon reflexes are symmetric and 3+ bilaterally.   Plantar responses are flexor.    DIAGNOSTIC DATA (LABS, IMAGING, TESTING) - I reviewed patient records, labs, notes, testing and imaging myself where available.  Lab Results  Component Value Date   WBC 5.8 07/08/2013   HGB 15.9 07/08/2013   HCT 47.2 07/08/2013   MCV 93.7 07/08/2013   PLT 197 07/08/2013      Component Value Date/Time   NA 140 07/08/2013 0930   K 4.2 07/08/2013 0930   CL 102 07/08/2013 0930   CO2 23 07/08/2013 0930   GLUCOSE 99 07/08/2013 0930   BUN 14 07/08/2013 0930   CREATININE 0.94 07/08/2013 0930   CALCIUM 9.1 07/08/2013 0930   PROT 7.5 07/08/2013 0930   ALBUMIN 4.1 07/08/2013 0930   AST 19 07/08/2013 0930   ALT 18 07/08/2013 0930   ALKPHOS 72 07/08/2013 0930   BILITOT <0.2* 07/08/2013 0930   GFRNONAA >90 07/08/2013 0930   GFRAA >90 07/08/2013 0930    No results found for: SFKCLEXN17 Lab Results  Component Value Date   TSH 1.867 08/17/2012     ASSESSMENT AND PLAN  Other fatigue - Plan: CBC with Differential, Comprehensive metabolic panel  Gait disturbance - Plan: MR Brain W Wo Contrast, MR Cervical Spine W Wo Contrast  Urinary urgency  High risk medication use - Plan: MR Brain W Wo Contrast, CBC with Differential, Comprehensive metabolic panel  Multiple sclerosis - Plan: MR Brain W Wo Contrast, MR Cervical Spine W Wo Contrast, CBC with Differential, Comprehensive metabolic panel  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 His main impairments are leg weakness with spasticity and gait disturbance.   He also has  fatigue and mood disturbances. He feels his gait has slowly worsened over the past few years and we will check an MRI of the cervical spine and brain to make sure that there has not been subclinical progression of the MS and to rule out compressive myelopathy or other etiology of gait worsening. I filled out disability forms for his prior employment. 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 Since Tecfidera can be associated with lymphopenia and liver issues I will check some blood work. If he has a very low lymphocyte count, we will need to consider a different medicine.  He will return to see me in 5 months or sooner if he has new or worsening neurologic symptoms.   Richard A. Epimenio Foot, MD, PhD 06/29/2014, 12:49 PM Certified in Neurology, Clinical Neurophysiology, Sleep Medicine, Pain Medicine and Neuroimaging  [facility] [address] [place], [place] [address] [phone]

## 2014-06-30 ENCOUNTER — Telehealth: Payer: Self-pay | Admitting: *Deleted

## 2014-06-30 LAB — COMPREHENSIVE METABOLIC PANEL
ALT: 12 IU/L (ref 0–44)
AST: 15 IU/L (ref 0–40)
Albumin/Globulin Ratio: 1.5 (ref 1.1–2.5)
Albumin: 4.3 g/dL (ref 3.5–5.5)
Alkaline Phosphatase: 73 IU/L (ref 39–117)
BUN/Creatinine Ratio: 9 (ref 9–20)
BUN: 9 mg/dL (ref 6–24)
Bilirubin Total: 0.3 mg/dL (ref 0.0–1.2)
CO2: 22 mmol/L (ref 18–29)
Calcium: 9.1 mg/dL (ref 8.7–10.2)
Chloride: 105 mmol/L (ref 97–108)
Creatinine, Ser: 1.02 mg/dL (ref 0.76–1.27)
GFR calc Af Amer: 93 mL/min/{1.73_m2} (ref >59)
GFR calc non Af Amer: 80 mL/min/{1.73_m2} (ref >59)
Globulin, Total: 2.8 g/dL (ref 1.5–4.5)
Glucose: 90 mg/dL (ref 65–99)
Potassium: 5 mmol/L (ref 3.5–5.2)
Sodium: 142 mmol/L (ref 134–144)
Total Protein: 7.1 g/dL (ref 6.0–8.5)

## 2014-06-30 LAB — CBC WITH DIFFERENTIAL/PLATELET
Basophils Absolute: 0 10*3/uL (ref 0.0–0.2)
Basos: 1 %
Eos: 2 %
Eosinophils Absolute: 0.1 10*3/uL (ref 0.0–0.4)
HCT: 46.1 % (ref 37.5–51.0)
Hemoglobin: 15.7 g/dL (ref 12.6–17.7)
Immature Grans (Abs): 0 10*3/uL (ref 0.0–0.1)
Immature Granulocytes: 0 %
Lymphocytes Absolute: 0.8 10*3/uL (ref 0.7–3.1)
Lymphs: 15 %
MCH: 31 pg (ref 26.6–33.0)
MCHC: 34.1 g/dL (ref 31.5–35.7)
MCV: 91 fL (ref 79–97)
Monocytes Absolute: 0.5 10*3/uL (ref 0.1–0.9)
Monocytes: 9 %
Neutrophils Absolute: 3.9 10*3/uL (ref 1.4–7.0)
Neutrophils Relative %: 73 %
Platelets: 235 10*3/uL (ref 150–379)
RBC: 5.07 x10E6/uL (ref 4.14–5.80)
RDW: 14.9 % (ref 12.3–15.4)
WBC: 5.3 10*3/uL (ref 3.4–10.8)

## 2014-06-30 NOTE — Telephone Encounter (Signed)
-----   Message from Asa Lente, MD sent at 06/30/2014  1:17 PM EST ----- Labs are fine.   Continue MS med's

## 2014-06-30 NOTE — Telephone Encounter (Signed)
Spoke with [name] and per RAS, advised that labwork looked ok--he should continue current meds.  He verbalized understanding of same/fim

## 2014-07-13 ENCOUNTER — Telehealth: Payer: Self-pay | Admitting: Neurology

## 2014-07-13 NOTE — Telephone Encounter (Signed)
Spoke with [name] who assured me he is taking Tecfidera as rx'd/fim

## 2014-07-13 NOTE — Telephone Encounter (Signed)
[facility] is calling to let you know that patient has been missing some doses of Dimethyl Fumarate (TECFIDERA) 240 MG CPDR and he has 2 full bottles on hand now.  If you have any questions please call.

## 2014-07-14 ENCOUNTER — Ambulatory Visit
Admission: RE | Admit: 2014-07-14 | Discharge: 2014-07-14 | Disposition: A | Discharge status: Home / Self Care | Payer: Medicare HMO | Source: Ambulatory Visit | Attending: Neurology | Admitting: Neurology

## 2014-07-14 DIAGNOSIS — G35 Multiple sclerosis: Secondary | ICD-10-CM | POA: Diagnosis not present

## 2014-07-14 DIAGNOSIS — R269 Unspecified abnormalities of gait and mobility: Secondary | ICD-10-CM | POA: Diagnosis not present

## 2014-07-14 DIAGNOSIS — Z79899 Other long term (current) drug therapy: Secondary | ICD-10-CM

## 2014-07-14 MED ORDER — GADOBENATE DIMEGLUMINE 529 MG/ML IV SOLN
17.0000 mL | Freq: Once | INTRAVENOUS | Status: AC | PRN
  Administered 2014-07-14: 17 mL via INTRAVENOUS

## 2014-07-19 ENCOUNTER — Telehealth: Payer: Self-pay | Admitting: *Deleted

## 2014-07-19 NOTE — Telephone Encounter (Signed)
Pt. aware of mri results as below/fim

## 2014-07-19 NOTE — Telephone Encounter (Signed)
-----   Message from Asa Lente, MD sent at 07/19/2014  9:53 AM EDT ----- MRI Brain shows MS plaques -- none were brand new     (lets see if we can get last [facility] MRI to compare) MRI spinal cord showed no MS in spinal cord but he has a fair amount of arthritic and disc change

## 2014-10-29 ENCOUNTER — Ambulatory Visit: Payer: Medicare HMO | Admitting: Neurology

## 2014-11-25 ENCOUNTER — Encounter: Payer: Self-pay | Admitting: Neurology

## 2014-11-25 ENCOUNTER — Ambulatory Visit (INDEPENDENT_AMBULATORY_CARE_PROVIDER_SITE_OTHER): Payer: Medicare HMO | Admitting: Neurology

## 2014-11-25 VITALS — BP 141/91 | HR 90 | Ht 71.0 in | Wt 181.0 lb

## 2014-11-25 DIAGNOSIS — R3915 Urgency of urination: Secondary | ICD-10-CM

## 2014-11-25 DIAGNOSIS — R5383 Other fatigue: Secondary | ICD-10-CM | POA: Diagnosis not present

## 2014-11-25 DIAGNOSIS — Z79899 Other long term (current) drug therapy: Secondary | ICD-10-CM

## 2014-11-25 DIAGNOSIS — G35 Multiple sclerosis: Secondary | ICD-10-CM

## 2014-11-25 DIAGNOSIS — R269 Unspecified abnormalities of gait and mobility: Secondary | ICD-10-CM | POA: Diagnosis not present

## 2014-11-25 DIAGNOSIS — F332 Major depressive disorder, recurrent severe without psychotic features: Secondary | ICD-10-CM

## 2014-11-25 NOTE — Progress Notes (Signed)
[facility]  PATIENT: [name] DOB: 1955-12-12  REFERRING DOCTOR OR PCP: Dr.  Shana Chute SOURCE: patient and records from [facility]  _________________________________   HISTORICAL  CHIEF COMPLAINT:  Chief Complaint  Patient presents with  . Follow-up    Tecfidera going well. Fatigued all the time.     HISTORY OF PRESENT ILLNESS:  [name] is a 59 year old man with relapsing remitting multiple sclerosis and bipolar disease.  He has been on Tecfidera since late 2013. He tolerates well and has not had any definite exacerbation while on it.   His lymphocyte count 2016 was 800 [phone] range).  I personally reviewed the MRIs of the brain from 06/2014. The MRI of the brain showed plaques and classic distribution for MS. The spinal cord showed no plaques. He did have some cervical degenerative changes without any definite nerve root compression.qa  Gait/strength/sensation: He has mild weakness in both legs associated with some spasticity.  They are fairly symmetric.  He does not note much weakness or numbness in the arms.. This leads to difficulty getting out of a chair and also causes his gait to be spastic and off balance. He has not had any MS related numbness in his pegs.  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   Bladder:  Over the past year, he has had urgency, frequency but also some hesitancy.   He has occasional constipation but had diarrhea 2 weeks ago. He has not had any urinary or fecal incontinence. Many years ago he did have some episodes of urinary incontinence  Vision:   He notes diplopia when he looks either left or right. There is less likely diplopia when he looks straight ahead. Acuity is fairly symmetric. He denies any eye pain.  Fatigue/sleep: He reports fatigue as his biggest problem. This has been a problem for him since diagnosis.  He only feels a little  rested and sore and feels a little better after he gets up and moves around some.   Then he notes more fatigue as the day goes on. If he rests for a while he feels better. He does not have to take naps. Amantadine helped in the past but stopped helping so he stopped.  .  Mood/cognition: He has bipolar disease and has had multiple inpatient psychiatric admissions with various changes of medications over the years. He's been off lithium since 2011.  He has some depression but feels he is fairly stable.   Dr. Shana Chute, his PCP, writes his current psych medications.    MS History:   He presented with double vision in 2000.  MRI at that time showed lesions in the brain consistent with multiple sclerosis. He also had a lumbar puncture and the CSF was consistent with MS.   He was started on Avonex.  He had another episode of diplopia followed by weakness in both legs 3 years later.    He was switched to Copaxone for a while. Unfortunate, he had an another severe exacerbation with his legs this. He was started on Tysabri once a month.   For several years, he stayed to [place] but stopped as it was difficult for him to get the infusions. In 2011, he switched to [place]. 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 He had one exacerbation with weakness in 2012. He then started Tecfidera in late 2013. He has generally tolerated  it well. He did not have any significant flushing or GI side effects. He has not had any definite exacerbations while on Tecfidera.    MRI's form 07/16/14 were reviewed (reports and images on PACS)  IMPRESSION: This is an abnormal MRI of the brain with and without contrast showing T2/flair hyperintense foci in the left pons and in the periventricular greater than juxtacortical and deep white matter of both hemispheres. These foci are in a pattern and configuration consistent with the clinical diagnosis of multiple sclerosis. There are no enhancing or  diffusion restricted foci.   IMPRESSION: This is an abnormal MRI of the cervical spine with and without contrast showing mild multilevel degenerative changes in the mid cervical spine as detailed above. There is no nerve root compression but there could be dynamic impingement of Left C4, right C6 and C7 nerve roots due to degenerative changes at C3-C4, C5-C6 and C6-C7. There does not appear to be any plaques or other abnormalities within the spinal cord.  REVIEW OF SYSTEMS: Constitutional: No fevers, chills, sweats, or change in appetite.   Fatigue Eyes: Notes double vision.  No eye pain Ear, nose and throat: No hearing loss, ear pain, nasal congestion, sore throat Cardiovascular: No chest pain, palpitations Respiratory: No shortness of breath at rest or with exertion.   No wheezes.  Occ coughs (smokes) GastrointestinaI: No nausea, vomiting, diarrhea, abdominal pain, fecal incontinence Genitourinary: No dysuria, urinary retention.  Mild frequency.  No nocturia. Musculoskeletal: No neck pain, mild back pain Integumentary: No rash, pruritus, skin lesions Neurological: as above Psychiatric: Notes depression and anxiety.   He has bipolar disorder Endocrine: No palpitations, diaphoresis, change in appetite, change in weigh or increased thirst Hematologic/Lymphatic: No anemia, purpura, petechiae. Allergic/Immunologic: No itchy/runny eyes, nasal congestion, recent allergic reactions, rashes  ALLERGIES: No Known Allergies  HOME MEDICATIONS:  Current outpatient prescriptions:  .  buPROPion (WELLBUTRIN XL) 300 MG 24 hr tablet, Take 1 tablet (300 mg total) by mouth daily. For depression, Disp: 30 tablet, Rfl: 0 .  Dimethyl Fumarate (TECFIDERA) 240 MG CPDR, Take 1 capsule (240 mg total) by mouth 2 (two) times daily., Disp: 60 capsule, Rfl: 0 .  escitalopram (LEXAPRO) 20 MG tablet, Take 1 tablet (20 mg total) by mouth daily. For depression, Disp: 30 tablet, Rfl: 0  PAST MEDICAL  HISTORY: Past Medical History  Diagnosis Date  . Mental disorder   . Depression   . Neuromuscular disorder   . Multiple sclerosis, relapsing-remitting   . MS (multiple sclerosis)   . Movement disorder   . Vision abnormalities     PAST SURGICAL HISTORY: Past Surgical History  Procedure Laterality Date  . Tonsillectomy      FAMILY HISTORY: Family History  Problem Relation Age of Onset  . Heart disease Mother   . Stroke Father     SOCIAL HISTORY:  History   Social History  . Marital Status: Legally Separated    Spouse Name: N/A  . Number of Children: N/A  . Years of Education: N/A   Occupational History  . Not on file.   Social History Main Topics  . Smoking status: Current Every Day Smoker -- 0.50 packs/day for 36 years    Types: Cigarettes  . Smokeless tobacco: Never Used  . Alcohol Use: 0.0 oz/week    0 Standard drinks or equivalent per week     Comment: "hardly drink except when I get into a hotel room, probably every 6 months or so"  . Drug Use: Yes  Comment: Marijuana- 2-3 bowls per day  . Sexual Activity: Not Currently    Birth Control/ Protection: None   Other Topics Concern  . Not on file   Social History Narrative   ** Merged History Encounter **         PHYSICAL EXAM  Filed Vitals:   11/25/14 1123  BP: 141/91  Pulse: 90  Height: 5\' 11"  (1.803 m)  Weight: 181 lb (82.101 kg)    Body mass index is 25.26 kg/(m^2).   General: The patient is well-developed and well-nourished and in no acute distress  Skin: Extremities are without significant edema.  Musculoskeletal:  Back is nontender  Neurologic Exam  Mental status: The patient is alert and oriented x 3 at the time of the examination. The patient has apparent normal recent and remote memory, with an apparently normal attention span and concentration ability.   Speech is normal.  Cranial nerves: Extraocular movements appear full.He notes diplopia to far gaze left and right.       Facial symmetry is present. There is good facial sensation to soft touch bilaterally.Facial strength is normal.  Trapezius and sternocleidomastoid strength is normal. No dysarthria is noted.    No obvious hearing deficits are noted.  Motor:  Muscle bulk is normal.   Tone is increased in legs. Strength is  5 / 5 in all 4 extremities except 4+/5 in hip flexors   Sensory: Sensory testing is intact to soft touch and vibration sensation in all 4 extremities.  Coordination: Cerebellar testing reveals good finger-nose-finger and poor heel-to-shin bilaterally.  Gait and station: Station is wide stanced.   Gait is wide stanced with small stride. Tandem gait is widet. Romberg is negative.   Reflexes: Deep tendon reflexes are symmetric and 3+ bilaterally.      DIAGNOSTIC DATA (LABS, IMAGING, TESTING) - I reviewed patient records, labs, notes, testing and imaging myself where available.  Lab Results  Component Value Date   WBC 5.3 06/29/2014   HGB 15.7 06/29/2014   HCT 46.1 06/29/2014   MCV 91 06/29/2014   PLT 235 06/29/2014      Component Value Date/Time   NA 142 06/29/2014 1033   NA 140 07/08/2013 0930   K 5.0 06/29/2014 1033   CL 105 06/29/2014 1033   CO2 22 06/29/2014 1033   GLUCOSE 90 06/29/2014 1033   GLUCOSE 99 07/08/2013 0930   BUN 9 06/29/2014 1033   BUN 14 07/08/2013 0930   CREATININE 1.02 06/29/2014 1033   CALCIUM 9.1 06/29/2014 1033   PROT 7.1 06/29/2014 1033   PROT 7.5 07/08/2013 0930   ALBUMIN 4.1 07/08/2013 0930   AST 15 06/29/2014 1033   ALT 12 06/29/2014 1033   ALKPHOS 73 06/29/2014 1033   BILITOT 0.3 06/29/2014 1033   BILITOT <0.2* 07/08/2013 0930   GFRNONAA 80 06/29/2014 1033   GFRAA 93 06/29/2014 1033    No results found for: VITAMINB12 Lab Results  Component Value Date   TSH 1.867 08/17/2012     ASSESSMENT AND PLAN  Multiple sclerosis - Plan: CBC with Differential/Platelet, CBC with Differential/Platelet  Gait disturbance  High risk medication  use - Plan: CBC with Differential/Platelet, CBC with Differential/Platelet  Other fatigue  Severe recurrent major depression without psychotic features  Urinary urgency  1.  Continue Tecfidera. We will check a CBC with differential to look for possible lymphopenia 2.   He is advised to try to be more active and exercise more if possible. 3.   If muscle  spasticity worsens, I'll have him try baclofen.  He will return to see me in 5 months or sooner if he has new or worsening neurologic symptoms.   Anevay Campanella A. Epimenio Foot, MD, PhD 11/25/2014, 1:10 PM Certified in Neurology, Clinical Neurophysiology, Sleep Medicine, Pain Medicine and Neuroimaging  [facility] [address], [address] [place], [place] [address] [phone]

## 2015-04-15 ENCOUNTER — Other Ambulatory Visit: Payer: Self-pay

## 2015-04-15 MED ORDER — DIMETHYL FUMARATE 240 MG PO CPDR: 240.0000 mg | DELAYED_RELEASE_CAPSULE | Freq: Two times a day (BID) | ORAL | Status: DC

## 2015-05-24 ENCOUNTER — Encounter: Payer: Self-pay | Admitting: *Deleted

## 2015-05-25 ENCOUNTER — Encounter: Payer: Self-pay | Admitting: *Deleted

## 2015-07-05 ENCOUNTER — Telehealth: Payer: Self-pay | Admitting: Neurology

## 2015-07-05 NOTE — Telephone Encounter (Signed)
Pt returned call. Please call when you have time. Thank you °

## 2015-07-05 NOTE — Telephone Encounter (Signed)
Pt called to notify our office that he had some disability forms faxed to our office. He says the paper work should be coming from [employer] and that once it is filled out it should be sent to [employer]. Pt is requesting a call back today. May call the pt [phone]

## 2015-07-05 NOTE — Telephone Encounter (Signed)
I have spoken with [name] this morning and advised that I have not received paperwork yet, but when I do, I will complete it asap and let him know when it is done/fim

## 2015-07-05 NOTE — Telephone Encounter (Signed)
I have received paperwork and will complete asap/fim

## 2015-07-06 NOTE — Telephone Encounter (Signed)
Patient is calling back regarding paperwork that needs to be completed and sent to his employer. I advised him we do have the paperwork and it will be completed asap. The patient would like a call back to discuss.

## 2015-07-06 NOTE — Telephone Encounter (Signed)
Mary with [employer] is calling regarding a fax they received today regarding a request for permission to release information on the patient to our office. Mary did not understand why this was sent to their office. Please call and discuss. Thank you.

## 2015-07-07 NOTE — Telephone Encounter (Signed)
Patient called back, states "he's tried to get in touch with Faith for 2 days and needs to get his paperwork faxed or he won't get paid". Please call patient at [phone].

## 2015-07-07 NOTE — Telephone Encounter (Signed)
1--paperwork was just received 2 days ago.  2--I have spoken with [name] this morning and advised that I have completed what I can of the paperwork (including his portion).  There is an fce portion that must be completed by RAS.  RAS is aware, but has had a very busy 2 days with clinic patients.  He will do this as soon as possible and then I can fax paperwork in./fim

## 2015-07-07 NOTE — Telephone Encounter (Signed)
I have spoken with [name] and advised I just faxed disability paperwork to [employer] fax # [phone]/fim

## 2015-07-11 ENCOUNTER — Other Ambulatory Visit (HOSPITAL_COMMUNITY)
Admission: RE | Admit: 2015-07-11 | Discharge: 2015-07-11 | Disposition: A | Discharge status: Home / Self Care | Payer: Medicare HMO | Source: Ambulatory Visit | Attending: Cardiology | Admitting: Cardiology

## 2015-07-11 DIAGNOSIS — G35 Multiple sclerosis: Secondary | ICD-10-CM | POA: Insufficient documentation

## 2015-07-11 DIAGNOSIS — R300 Dysuria: Secondary | ICD-10-CM | POA: Insufficient documentation

## 2015-07-11 DIAGNOSIS — I10 Essential (primary) hypertension: Secondary | ICD-10-CM | POA: Diagnosis not present

## 2015-07-11 DIAGNOSIS — Z1159 Encounter for screening for other viral diseases: Secondary | ICD-10-CM | POA: Diagnosis not present

## 2015-07-11 DIAGNOSIS — F329 Major depressive disorder, single episode, unspecified: Secondary | ICD-10-CM | POA: Insufficient documentation

## 2015-07-11 LAB — LIPID PANEL
Cholesterol: 177 mg/dL (ref 0–200)
HDL: 46 mg/dL (ref >40)
LDL Cholesterol: 108 mg/dL — ABNORMAL HIGH (ref 0–99)
Total CHOL/HDL Ratio: 3.8 RATIO
Triglycerides: 117 mg/dL (ref <150)
VLDL: 23 mg/dL (ref 0–40)

## 2015-07-11 LAB — URINALYSIS, ROUTINE W REFLEX MICROSCOPIC
Bilirubin Urine: NEGATIVE
Glucose, UA: NEGATIVE mg/dL
Hgb urine dipstick: NEGATIVE
Ketones, ur: NEGATIVE mg/dL
Leukocytes, UA: NEGATIVE
Nitrite: NEGATIVE
Protein, ur: NEGATIVE mg/dL
Specific Gravity, Urine: 1.023 (ref 1.005–1.030)
pH: 5.5 (ref 5.0–8.0)

## 2015-07-11 LAB — CBC
HCT: 47.4 % (ref 39.0–52.0)
Hemoglobin: 15.8 g/dL (ref 13.0–17.0)
MCH: 31.7 pg (ref 26.0–34.0)
MCHC: 33.3 g/dL (ref 30.0–36.0)
MCV: 95 fL (ref 78.0–100.0)
Platelets: 181 10*3/uL (ref 150–400)
RBC: 4.99 MIL/uL (ref 4.22–5.81)
RDW: 15.2 % (ref 11.5–15.5)
WBC: 5.6 10*3/uL (ref 4.0–10.5)

## 2015-07-11 LAB — COMPREHENSIVE METABOLIC PANEL
ALT: 13 U/L — ABNORMAL LOW (ref 17–63)
AST: 17 U/L (ref 15–41)
Albumin: 3.8 g/dL (ref 3.5–5.0)
Alkaline Phosphatase: 68 U/L (ref 38–126)
Anion gap: 9 (ref 5–15)
BUN: 11 mg/dL (ref 6–20)
CO2: 23 mmol/L (ref 22–32)
Calcium: 8.8 mg/dL — ABNORMAL LOW (ref 8.9–10.3)
Chloride: 112 mmol/L — ABNORMAL HIGH (ref 101–111)
Creatinine, Ser: 1.16 mg/dL (ref 0.61–1.24)
GFR calc Af Amer: >60 mL/min (ref >60)
GFR calc non Af Amer: >60 mL/min (ref >60)
Glucose, Bld: 107 mg/dL — ABNORMAL HIGH (ref 65–99)
Potassium: 4.4 mmol/L (ref 3.5–5.1)
Sodium: 144 mmol/L (ref 135–145)
Total Bilirubin: 0.3 mg/dL (ref 0.3–1.2)
Total Protein: 6.6 g/dL (ref 6.5–8.1)

## 2015-07-11 LAB — SEDIMENTATION RATE: Sed Rate: 2 mm/hr (ref 0–16)

## 2015-07-11 LAB — TSH: TSH: 1.613 u[IU]/mL (ref 0.350–4.500)

## 2015-07-11 LAB — T4, FREE: Free T4: 0.88 ng/dL (ref 0.61–1.12)

## 2015-07-11 LAB — PSA: PSA: 1.24 ng/mL (ref 0.00–4.00)

## 2015-07-12 LAB — MICROALBUMIN, URINE: Microalb, Ur: 75.9 ug/mL — ABNORMAL HIGH

## 2015-07-12 LAB — HEPATITIS PANEL, ACUTE
HCV Ab: 0.1 s/co ratio (ref 0.0–0.9)
Hep A IgM: NEGATIVE
Hep B C IgM: NEGATIVE
Hepatitis B Surface Ag: NEGATIVE

## 2015-07-12 LAB — ANTINUCLEAR ANTIBODIES, IFA: ANA Ab, IFA: NEGATIVE

## 2015-07-12 LAB — TESTOSTERONE: Testosterone: 733 ng/dL (ref 348–1197)

## 2015-07-15 ENCOUNTER — Encounter: Payer: Self-pay | Admitting: Neurology

## 2015-07-15 ENCOUNTER — Emergency Department (HOSPITAL_COMMUNITY)
Admission: EM | Admit: 2015-07-15 | Discharge: 2015-07-16 | Disposition: A | Discharge status: Home / Self Care | Payer: Medicare HMO | Attending: Emergency Medicine | Admitting: Emergency Medicine

## 2015-07-15 ENCOUNTER — Ambulatory Visit (INDEPENDENT_AMBULATORY_CARE_PROVIDER_SITE_OTHER): Payer: Medicare HMO | Admitting: Neurology

## 2015-07-15 ENCOUNTER — Encounter (HOSPITAL_COMMUNITY): Payer: Self-pay | Admitting: Emergency Medicine

## 2015-07-15 VITALS — BP 150/80 | HR 66 | Resp 18 | Ht 71.0 in | Wt 189.5 lb

## 2015-07-15 DIAGNOSIS — F1210 Cannabis abuse, uncomplicated: Secondary | ICD-10-CM | POA: Insufficient documentation

## 2015-07-15 DIAGNOSIS — R269 Unspecified abnormalities of gait and mobility: Secondary | ICD-10-CM

## 2015-07-15 DIAGNOSIS — G35 Multiple sclerosis: Secondary | ICD-10-CM | POA: Diagnosis not present

## 2015-07-15 DIAGNOSIS — F332 Major depressive disorder, recurrent severe without psychotic features: Secondary | ICD-10-CM

## 2015-07-15 DIAGNOSIS — F32A Depression, unspecified: Secondary | ICD-10-CM

## 2015-07-15 DIAGNOSIS — Z79899 Other long term (current) drug therapy: Secondary | ICD-10-CM | POA: Insufficient documentation

## 2015-07-15 DIAGNOSIS — F1010 Alcohol abuse, uncomplicated: Secondary | ICD-10-CM | POA: Diagnosis present

## 2015-07-15 DIAGNOSIS — R3915 Urgency of urination: Secondary | ICD-10-CM | POA: Diagnosis not present

## 2015-07-15 DIAGNOSIS — F1094 Alcohol use, unspecified with alcohol-induced mood disorder: Secondary | ICD-10-CM

## 2015-07-15 DIAGNOSIS — F329 Major depressive disorder, single episode, unspecified: Secondary | ICD-10-CM | POA: Diagnosis not present

## 2015-07-15 DIAGNOSIS — Z8669 Personal history of other diseases of the nervous system and sense organs: Secondary | ICD-10-CM | POA: Diagnosis not present

## 2015-07-15 DIAGNOSIS — F1014 Alcohol abuse with alcohol-induced mood disorder: Secondary | ICD-10-CM | POA: Insufficient documentation

## 2015-07-15 DIAGNOSIS — R45851 Suicidal ideations: Secondary | ICD-10-CM

## 2015-07-15 DIAGNOSIS — R5383 Other fatigue: Secondary | ICD-10-CM | POA: Diagnosis not present

## 2015-07-15 DIAGNOSIS — F17210 Nicotine dependence, cigarettes, uncomplicated: Secondary | ICD-10-CM | POA: Insufficient documentation

## 2015-07-15 NOTE — Progress Notes (Signed)
[facility]  PATIENT: [name] DOB: [birth year]  REFERRING DOCTOR OR PCP: Dr.  Shana Chute SOURCE: patient and records from [facility]  _________________________________   HISTORICAL  CHIEF COMPLAINT:  Chief Complaint  Patient presents with  . Multiple Sclerosis    Sts. he ran  out of Tecfidera a month ago and wasn't sure how to reorder it.  He c/o more tingling bilat arms, right worse than left.  Sts. he feels more emotional./fim    HISTORY OF PRESENT ILLNESS:  [name] is a 60 year old man with relapsing remitting multiple sclerosis and bipolar disease.  He notes a little more numbness/tingling in the right arm and also notes more emotional lability/emotion at times, sometimes while watcxhing TV.    He has been on Tecfidera since late 2013 but ran out 6-8 weeks ago.   Marland KitchenHe tolerates well and has not had any definite exacerbation while on it.    I personally reviewed the MRIs of the brain from 06/2014. The MRI of the brain showed plaques and classic distribution for MS. The spinal cord showed no plaques. He did have some cervical degenerative changes without any definite nerve root compression.qa  Gait/strength/sensation: Gait is slow but steady and he has not fallen recently.   He notes mild weakness in both legs associated with some spasticity. He denies weakness or numbness in the arms.. He sometimes has trouble getting out of a chair.  He notes right arm tingling but not pain.   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   Bladder:  He has more hesitancy.   He also has some urgency, frequency.   He has occasional constipation but had diarrhea 2 weeks ago. He has not had any urinary or fecal incontinence in the past few years.    Vision:   He notes diplopia when he looks either left or right. There is less likely diplopia when he looks straight ahead. Acuity is fairly  symmetric. He denies any eye pain.  Fatigue/sleep: He reports fatigue as his biggest problem x many years. He notes more fatigue as the day goes on. If he rests for a while he feels better. He does not have to take naps. Amantadine helped in the past but bnot much so he stopped.  .  Mood/cognition: He has bipolar disease and has had multiple inpatient psychiatric admissions with various changes of medications over the years. He's been off lithium since 2011.  He has some depression but feels he is fairly stable.   Dr. Shana Chute, his PCP, writes his current psych medications.    MS History:   He presented with double vision in 2000.  MRI at that time showed lesions in the brain consistent with multiple sclerosis. He also had a lumbar puncture and the CSF was consistent with MS.   He was started on Avonex.  He had another episode of diplopia followed by weakness in both legs 3 years later.    He was switched to Copaxone for a while. Unfortunate, he had an another severe exacerbation with his legs this. He was started on Tysabri once a month.   For several years, he stayed to [place] but stopped as it was difficult for him to get the infusions. In 2011, he switched to [place]. 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 He had one exacerbation with weakness in 2012. He then  started Tecfidera in late 2013. He has generally tolerated it well. He did not have any significant flushing or GI side effects. He has not had any definite exacerbations while on Tecfidera.    MRI's form 07/16/14 were reviewed (reports and images on PACS)  IMPRESSION: This is an abnormal MRI of the brain with and without contrast showing T2/flair hyperintense foci in the left pons and in the periventricular greater than juxtacortical and deep white matter of both hemispheres. These foci are in a pattern and configuration consistent with the clinical diagnosis of multiple sclerosis. There are no  enhancing or diffusion restricted foci.   IMPRESSION: This is an abnormal MRI of the cervical spine with and without contrast showing mild multilevel degenerative changes in the mid cervical spine as detailed above. There is no nerve root compression but there could be dynamic impingement of Left C4, right C6 and C7 nerve roots due to degenerative changes at C3-C4, C5-C6 and C6-C7. There does not appear to be any plaques or other abnormalities within the spinal cord.  REVIEW OF SYSTEMS: Constitutional: No fevers, chills, sweats, or change in appetite.   Fatigue Eyes: Notes double vision.  No eye pain Ear, nose and throat: No hearing loss, ear pain, nasal congestion, sore throat Cardiovascular: No chest pain, palpitations Respiratory: No shortness of breath at rest or with exertion.   No wheezes.  Occ coughs (smokes) GastrointestinaI: No nausea, vomiting, diarrhea, abdominal pain, fecal incontinence Genitourinary: No dysuria, urinary retention.  Mild frequency.  No nocturia. Musculoskeletal: No neck pain, mild back pain Integumentary: No rash, pruritus, skin lesions Neurological: as above Psychiatric: Notes depression and anxiety.   He has bipolar disorder Endocrine: No palpitations, diaphoresis, change in appetite, change in weigh or increased thirst Hematologic/Lymphatic: No anemia, purpura, petechiae. Allergic/Immunologic: No itchy/runny eyes, nasal congestion, recent allergic reactions, rashes  ALLERGIES: No Known Allergies  HOME MEDICATIONS:  Current outpatient prescriptions:  .  buPROPion (WELLBUTRIN XL) 300 MG 24 hr tablet, Take 1 tablet (300 mg total) by mouth daily. For depression, Disp: 30 tablet, Rfl: 0 .  escitalopram (LEXAPRO) 20 MG tablet, Take 1 tablet (20 mg total) by mouth daily. For depression, Disp: 30 tablet, Rfl: 0 .  Dimethyl Fumarate (TECFIDERA) 240 MG CPDR, Take 1 capsule (240 mg total) by mouth 2 (two) times daily. (Patient not taking: Reported on  07/15/2015), Disp: 180 capsule, Rfl: 0  PAST MEDICAL HISTORY: Past Medical History  Diagnosis Date  . Mental disorder   . Depression   . Neuromuscular disorder (HCC)   . Multiple sclerosis, relapsing-remitting (HCC)   . MS (multiple sclerosis) (HCC)   . Movement disorder   . Vision abnormalities     PAST SURGICAL HISTORY: Past Surgical History  Procedure Laterality Date  . Tonsillectomy      FAMILY HISTORY: Family History  Problem Relation Age of Onset  . Heart disease Mother   . Stroke Father     SOCIAL HISTORY:  Social History   Social History  . Marital Status: Legally Separated    Spouse Name: N/A  . Number of Children: N/A  . Years of Education: N/A   Occupational History  . Not on file.   Social History Main Topics  . Smoking status: Current Every Day Smoker -- 0.50 packs/day for 36 years    Types: Cigarettes  . Smokeless tobacco: Never Used  . Alcohol Use: 0.0 oz/week    0 Standard drinks or equivalent per week     Comment: "hardly drink except  when I get into a hotel room, probably every 6 months or so"  . Drug Use: Yes     Comment: Marijuana- 2-3 bowls per day  . Sexual Activity: Not Currently    Birth Control/ Protection: None   Other Topics Concern  . Not on file   Social History Narrative   ** Merged History Encounter **         PHYSICAL EXAM  Filed Vitals:   07/15/15 1100  BP: 150/80  Pulse: 66  Resp: 18  Height: 5\' 11"  (1.803 m)  Weight: 189 lb 8 oz (85.957 kg)    Body mass index is 26.44 kg/(m^2).   General: The patient is well-developed and well-nourished and in no acute distress  Musculoskeletal:  Back is nontender  Neurologic Exam  Mental status: The patient is alert and oriented x 3 at the time of the examination. The patient has apparent normal recent and remote memory, with an apparently normal attention span and concentration ability.   Speech is normal.  Cranial nerves: Extraocular movements appear full but he  notes diplopia to far gaze left and right.      Facial symmetry is present. There is good facial sensation to soft touch bilaterally.Facial strength is normal.  Trapezius and sternocleidomastoid strength is normal. No dysarthria is noted.    No obvious hearing deficits are noted.  Motor:  Muscle bulk is normal.   Tone is increased in legs. Strength is  5 / 5 in all 4 extremities except 4+/5 in hip flexors   Sensory: Sensory testing is intact to soft touch and vibration sensation in all 4 extremities.  Coordination: Cerebellar testing reveals good finger-nose-finger and poor heel-to-shin bilaterally.  Gait and station: Station is wide stanced.   Gait is wide stanced with small stride.   . Tandem gait is wide. Romberg is negative.   Reflexes: Deep tendon reflexes are symmetric and 3+ bilaterally.    Spread at the knees    DIAGNOSTIC DATA (LABS, IMAGING, TESTING) - I reviewed patient records, labs, notes, testing and imaging myself where available.  Lab Results  Component Value Date   WBC 5.6 07/11/2015   HGB 15.8 07/11/2015   HCT 47.4 07/11/2015   MCV 95.0 07/11/2015   PLT 181 07/11/2015      Component Value Date/Time   NA 144 07/11/2015 1145   NA 142 06/29/2014 1033   K 4.4 07/11/2015 1145   CL 112* 07/11/2015 1145   CO2 23 07/11/2015 1145   GLUCOSE 107* 07/11/2015 1145   GLUCOSE 90 06/29/2014 1033   BUN 11 07/11/2015 1145   BUN 9 06/29/2014 1033   CREATININE 1.16 07/11/2015 1145   CALCIUM 8.8* 07/11/2015 1145   PROT 6.6 07/11/2015 1145   PROT 7.1 06/29/2014 1033   ALBUMIN 3.8 07/11/2015 1145   ALBUMIN 4.3 06/29/2014 1033   AST 17 07/11/2015 1145   ALT 13* 07/11/2015 1145   ALKPHOS 68 07/11/2015 1145   BILITOT 0.3 07/11/2015 1145   BILITOT 0.3 06/29/2014 1033   GFRNONAA >60 07/11/2015 1145   GFRAA >60 07/11/2015 1145    No results found for: VITAMINB12 Lab Results  Component Value Date   TSH 1.613 07/11/2015     ASSESSMENT AND PLAN  Multiple sclerosis  (HCC)  Gait disturbance  Other fatigue  Severe recurrent major depression without psychotic features (HCC)  High risk medication use  Urinary urgency  1.  Continue Tecfidera. We will try to get him back on therapy.  MRI brain with / without to assess for subclinical progression.   If present, consider change to another medication.   2.   He is advised to try to be more active and exercise more if possible. 3.   At this time, the dysesthesias are not too uncomfortable.   If they become more uncomfortable, add Lamotrigine to help dysesthesias and mood. 4.   If muscle spasticity worsens, I'll have him try baclofen.  He will return to see me in 6 months or sooner if he has new or worsening neurologic symptoms.   Richard A. Epimenio Foot, MD, PhD 07/15/2015, 11:09 AM Certified in Neurology, Clinical Neurophysiology, Sleep Medicine, Pain Medicine and Neuroimaging  [facility] [address] [place], [place] [address] [phone]

## 2015-07-15 NOTE — ED Notes (Addendum)
Pt here via EMS with [employer]. Pt called [employer] when he was on a bridge wanting to jump off. Pt states he has been unhappy with his pain management for his MS. Pt has active SI but denies HI and denies A/VH. Pt states he "is tired of everything" and would not participate fully in assessment. Pt states he has a poor support system and has had a few beers tonight. Pt states he hasn't seen a therapist in "a few years". He takes wellbutrin, but can not say who prescribes them

## 2015-07-16 DIAGNOSIS — F1010 Alcohol abuse, uncomplicated: Secondary | ICD-10-CM | POA: Diagnosis present

## 2015-07-16 DIAGNOSIS — F1094 Alcohol use, unspecified with alcohol-induced mood disorder: Secondary | ICD-10-CM | POA: Diagnosis present

## 2015-07-16 LAB — COMPREHENSIVE METABOLIC PANEL
ALT: 11 IU/L (ref 0–44)
ALT: 15 U/L — ABNORMAL LOW (ref 17–63)
AST: 19 IU/L (ref 0–40)
AST: 21 U/L (ref 15–41)
Albumin/Globulin Ratio: 1.5 (ref 1.2–2.2)
Albumin: 4 g/dL (ref 3.6–4.8)
Albumin: 4.2 g/dL (ref 3.5–5.0)
Alkaline Phosphatase: 63 U/L (ref 38–126)
Alkaline Phosphatase: 72 IU/L (ref 39–117)
Anion gap: 11 (ref 5–15)
BUN/Creatinine Ratio: 9 — ABNORMAL LOW (ref 10–22)
BUN: 10 mg/dL (ref 8–27)
BUN: 9 mg/dL (ref 6–20)
Bilirubin Total: 0.3 mg/dL (ref 0.0–1.2)
CO2: 19 mmol/L — ABNORMAL LOW (ref 22–32)
CO2: 22 mmol/L (ref 18–29)
Calcium: 8.6 mg/dL — ABNORMAL LOW (ref 8.9–10.3)
Calcium: 9.1 mg/dL (ref 8.6–10.2)
Chloride: 108 mmol/L — ABNORMAL HIGH (ref 96–106)
Chloride: 113 mmol/L — ABNORMAL HIGH (ref 101–111)
Creatinine, Ser: 0.85 mg/dL (ref 0.61–1.24)
Creatinine, Ser: 1.08 mg/dL (ref 0.76–1.27)
GFR calc Af Amer: 86 mL/min/{1.73_m2} (ref >59)
GFR calc Af Amer: >60 mL/min (ref >60)
GFR calc non Af Amer: 74 mL/min/{1.73_m2} (ref >59)
GFR calc non Af Amer: >60 mL/min (ref >60)
Globulin, Total: 2.6 g/dL (ref 1.5–4.5)
Glucose, Bld: 89 mg/dL (ref 65–99)
Glucose: 96 mg/dL (ref 65–99)
Potassium: 4 mmol/L (ref 3.5–5.1)
Potassium: 5.1 mmol/L (ref 3.5–5.2)
Sodium: 143 mmol/L (ref 135–145)
Sodium: 146 mmol/L — ABNORMAL HIGH (ref 134–144)
Total Bilirubin: 0.4 mg/dL (ref 0.3–1.2)
Total Protein: 6.6 g/dL (ref 6.0–8.5)
Total Protein: 7 g/dL (ref 6.5–8.1)

## 2015-07-16 LAB — CBC
HCT: 45.9 % (ref 39.0–52.0)
Hemoglobin: 16.2 g/dL (ref 13.0–17.0)
MCH: 32.7 pg (ref 26.0–34.0)
MCHC: 35.3 g/dL (ref 30.0–36.0)
MCV: 92.7 fL (ref 78.0–100.0)
Platelets: 195 10*3/uL (ref 150–400)
RBC: 4.95 MIL/uL (ref 4.22–5.81)
RDW: 15.1 % (ref 11.5–15.5)
WBC: 5.5 10*3/uL (ref 4.0–10.5)

## 2015-07-16 LAB — ETHANOL: Alcohol, Ethyl (B): 88 mg/dL — ABNORMAL HIGH (ref <5)

## 2015-07-16 LAB — RAPID URINE DRUG SCREEN, HOSP PERFORMED
Amphetamines: NOT DETECTED
Barbiturates: NOT DETECTED
Benzodiazepines: NOT DETECTED
Cocaine: NOT DETECTED
Opiates: NOT DETECTED
Tetrahydrocannabinol: POSITIVE — AB

## 2015-07-16 LAB — CBC WITH DIFFERENTIAL/PLATELET
Basophils Absolute: 0 10*3/uL (ref 0.0–0.2)
Basos: 0 %
EOS (ABSOLUTE): 0.1 10*3/uL (ref 0.0–0.4)
Eos: 2 %
Hematocrit: 47.4 % (ref 37.5–51.0)
Hemoglobin: 16.1 g/dL (ref 12.6–17.7)
Immature Grans (Abs): 0 10*3/uL (ref 0.0–0.1)
Immature Granulocytes: 0 %
Lymphocytes Absolute: 0.8 10*3/uL (ref 0.7–3.1)
Lymphs: 18 %
MCH: 31.8 pg (ref 26.6–33.0)
MCHC: 34 g/dL (ref 31.5–35.7)
MCV: 94 fL (ref 79–97)
Monocytes Absolute: 0.4 10*3/uL (ref 0.1–0.9)
Monocytes: 8 %
Neutrophils Absolute: 3.4 10*3/uL (ref 1.4–7.0)
Neutrophils: 72 %
Platelets: 196 10*3/uL (ref 150–379)
RBC: 5.06 x10E6/uL (ref 4.14–5.80)
RDW: 15.1 % (ref 12.3–15.4)
WBC: 4.7 10*3/uL (ref 3.4–10.8)

## 2015-07-16 LAB — SALICYLATE LEVEL: Salicylate Lvl: <4 mg/dL (ref 2.8–30.0)

## 2015-07-16 LAB — ACETAMINOPHEN LEVEL: Acetaminophen (Tylenol), Serum: <10 ug/mL — ABNORMAL LOW (ref 10–30)

## 2015-07-16 MED ORDER — DIMETHYL FUMARATE 240 MG PO CPDR: 240.0000 mg | DELAYED_RELEASE_CAPSULE | Freq: Two times a day (BID) | ORAL | Status: DC

## 2015-07-16 MED ORDER — NICOTINE 21 MG/24HR TD PT24
21.0000 mg | MEDICATED_PATCH | Freq: Every day | TRANSDERMAL | Status: DC
  Filled 2015-07-16: qty 1

## 2015-07-16 MED ORDER — BUPROPION HCL ER (XL) 300 MG PO TB24
300.0000 mg | ORAL_TABLET | Freq: Every day | ORAL | Status: DC
  Administered 2015-07-16: 300 mg via ORAL
  Filled 2015-07-16: qty 1

## 2015-07-16 MED ORDER — ESCITALOPRAM OXALATE 10 MG PO TABS
20.0000 mg | ORAL_TABLET | Freq: Every day | ORAL | Status: DC
  Administered 2015-07-16: 20 mg via ORAL
  Filled 2015-07-16: qty 2

## 2015-07-16 NOTE — ED Notes (Signed)
Pt d/c instructions given with no questions/concerns voiced by pt.  Pt d/c'd home via private vehicle with wife

## 2015-07-16 NOTE — [facility] Suicide Risk Assessment (Signed)
Suicide Risk Assessment  Discharge Assessment   [facility] Discharge Suicide Risk Assessment   Principal Problem: Alcohol-induced mood disorder [facility]) Discharge Diagnoses:  Patient Active Problem List   Diagnosis Date Noted  . Alcohol abuse [F10.10] 07/16/2015    Priority: High  . Alcohol-induced mood disorder (HCC) [F10.94] 07/16/2015    Priority: High  . 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 Dysthymia [F34.1] 05/11/2011    Priority: High  . Other fatigue [R53.83] 06/29/2014  . Gait disturbance [R26.9] 06/29/2014  . Urinary urgency [R39.15] 06/29/2014  . High risk medication use [Z79.899] 06/29/2014  . Multiple sclerosis (HCC) [G35] 10/31/2011    Class: Chronic    Total Time spent with patient: 45 minutes  Musculoskeletal: Strength & Muscle Tone: decreased Gait & Station: steady with a cane Patient leans: N/A  Psychiatric Specialty Exam: Review of Systems  Constitutional: Negative.   HENT: Negative.   Eyes: Negative.   Respiratory: Negative.   Cardiovascular: Negative.   Gastrointestinal: Negative.   Genitourinary: Negative.   Musculoskeletal: Negative.   Skin: Negative.   Neurological: Negative.   Endo/Heme/Allergies: Negative.   Psychiatric/Behavioral: Positive for depression.    Blood pressure 127/85, pulse 82, temperature 98.7 F (37.1 C), temperature source Oral, resp. rate 18, SpO2 91 %.There is no weight on file to calculate BMI.  General Appearance: Casual  Eye Contact::  Good  Speech:  Normal Rate  Volume:  Normal  Mood:  Depressed, mild  Affect:  Congruent  Thought Process:  Coherent  Orientation:  Full (Time, Place, and Person)  Thought Content:  WDL  Suicidal Thoughts:  No  Homicidal Thoughts:  No  Memory:  Immediate;   Good Recent;   Good Remote;   Good  Judgement:  Fair  Insight:  Fair  Psychomotor Activity:  Normal  Concentration:  Good  Recall:  Good  Fund of  Knowledge:Good  Language: Good  Akathisia:  No  Handed:  Right  AIMS (if indicated):     Assets:  Housing Leisure Time Resilience Social Support  ADL's:  Intact  Cognition: WNL  Sleep:      Mental Status Per Nursing Assessment::   On Admission:   Suicidal ideations  Demographic Factors:  Male and Caucasian  Loss Factors: NA  Historical Factors: NA  Risk Reduction Factors:   Sense of responsibility to family, Living with another person, especially a relative, Positive social support and Positive therapeutic relationship  Continued Clinical Symptoms:  Depression, mild  Cognitive Features That Contribute To Risk:  None    Suicide Risk:  Minimal: No identifiable suicidal ideation.  Patients presenting with no risk factors but with morbid ruminations; may be classified as minimal risk based on the severity of the depressive symptoms    Plan Of Care/Follow-up recommendations:  Activity:  as tolerated Diet:  heart healthy diet  LORD, JAMISON, NP 07/16/2015, 12:01 PM

## 2015-07-16 NOTE — ED Provider Notes (Signed)
CSN: [id number]     Arrival date & time 07/15/15  2329 History  By signing my name below, I, [name], attest that this documentation has been prepared under the direction and in the presence of Alexander Razor, MD. Electronically Signed: Randell [name], ED Scribe. 07/16/2015. 12:16 AM.   Chief Complaint  [name] presents with  . Suicidal   The history is provided by the [name]. No language interpreter was used.   HPI Comments: [name] is a 60 y.o. male with an hx of depression, MS, and mental disorder brought in by EMS with [employer] who presents to the Emergency Department complaining of SI and suicidal attempt that occurred earlier tonight. [employer] states that the [name] attempted to jump off a bridge earlier tonight. [name] reports that he has grown tired of his current problems and states that he is tired of everything but. He has taken Tecifidera which was prescribed by his neurologist. He notes that he has attempted self-harm multiple times in the past. He also states that he has smoked marijuana with some relief. Denies recent counseling for depression and states that he was last seen here at [name] 2 years ago for similar symptoms for which he was admitted for 1 week. Denies HI and any other symptoms currently.  Past Medical History  Diagnosis Date  . Mental disorder   . Depression   . Neuromuscular disorder (HCC)   . Multiple sclerosis, relapsing-remitting (HCC)   . MS (multiple sclerosis) (HCC)   . Movement disorder   . Vision abnormalities    Past Surgical History  Procedure Laterality Date  . Tonsillectomy     Family History  Problem Relation Age of Onset  . Heart disease Mother   . Stroke Father    Social History  Substance Use Topics  . Smoking status: Current Every Day Smoker -- 0.50 packs/day for 36 years    Types: Cigarettes  . Smokeless tobacco: Never Used  . Alcohol Use: 0.0 oz/week    0 Standard drinks or equivalent per week     Comment: "hardly  drink except when I get into a hotel room, probably every 6 months or so"    Review of Systems  Psychiatric/Behavioral: Positive for suicidal ideas and dysphoric mood.  All other systems reviewed and are negative.   Allergies  Review of [name]'s allergies indicates no known allergies.  Home Medications   Prior to Admission medications   Medication Sig Start Date End Date Taking? Authorizing Provider  buPROPion (WELLBUTRIN XL) 300 MG 24 hr tablet Take 1 tablet (300 mg total) by mouth daily. For depression 09/25/12  Yes Thermon Leyland, NP  escitalopram (LEXAPRO) 20 MG tablet Take 1 tablet (20 mg total) by mouth daily. For depression 09/25/12  Yes Thermon Leyland, NP  Dimethyl Fumarate (TECFIDERA) 240 MG CPDR Take 1 capsule (240 mg total) by mouth 2 (two) times daily. [name] not taking: Reported on 07/15/2015 04/15/15   Asa Lente, MD   BP 116/77 mmHg  Pulse 80  Temp(Src) 98.1 F (36.7 C) (Oral)  Resp 20  SpO2 95% Physical Exam  Constitutional: He is oriented to person, place, and time. He appears well-developed and well-nourished. No distress.  HENT:  Head: Normocephalic and atraumatic.  Eyes: Conjunctivae and EOM are normal.  Neck: Neck supple. No tracheal deviation present.  Cardiovascular: Normal rate.   Pulmonary/Chest: Effort normal. No respiratory distress.  Musculoskeletal: Normal range of motion.  Neurological: He is alert and oriented to person, place,  and time.  Skin: Skin is warm and dry.  Psychiatric:  Flat affect. Poor eye contact. Generally calm and cooperative. Thought process logical.  Nursing note and vitals reviewed.   ED Course  Procedures   DIAGNOSTIC STUDIES: Oxygen Saturation is 95% on RA, adequate by my interpretation.    COORDINATION OF CARE: 12:05 AM Will consult with behavioral health. Discussed treatment plan with pt at bedside and pt agreed to plan.   Labs Review Labs Reviewed  COMPREHENSIVE METABOLIC PANEL - Abnormal; Notable for the  following:    Chloride 113 (*)    CO2 19 (*)    Calcium 8.6 (*)    ALT 15 (*)    All other components within normal limits  ETHANOL - Abnormal; Notable for the following:    Alcohol, Ethyl (B) 88 (*)    All other components within normal limits  ACETAMINOPHEN LEVEL - Abnormal; Notable for the following:    Acetaminophen (Tylenol), Serum <10 (*)    All other components within normal limits  URINE RAPID DRUG SCREEN, HOSP PERFORMED - Abnormal; Notable for the following:    Tetrahydrocannabinol POSITIVE (*)    All other components within normal limits  SALICYLATE LEVEL  CBC    Imaging Review No results found. I have personally reviewed and evaluated these images and lab results as part of my medical decision-making.   EKG Interpretation None      MDM   Final diagnoses:  Depression  Suicidal thoughts    60yM with depression and SI. Medically cleared for psych eval.   I personally preformed the services scribed in my presence. The recorded information has been reviewed is accurate. Alexander Razor, MD.    Alexander Razor, MD 07/16/15 [phone]
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
## 2015-07-16 NOTE — Consult Note (Signed)
[facility] Consult   Reason for Consult:  Suicidal ideations Referring Physician:  EDP Patient Identification: [name] MRN:  [id number] Principal Diagnosis: Alcohol-induced mood disorder [facility]) Diagnosis:   Patient Active Problem List   Diagnosis Date Noted  . Alcohol abuse [F10.10] 07/16/2015    Priority: High  . Alcohol-induced mood disorder ([place]) [F10.94] 07/16/2015    Priority: High  . 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 Dysthymia [F34.1] 05/11/2011    Priority: High  . Other fatigue [R53.83] 06/29/2014  . Gait disturbance [R26.9] 06/29/2014  . Urinary urgency [R39.15] 06/29/2014  . High risk medication use [Z79.899] 06/29/2014  . Multiple sclerosis ([place]) [G35] 10/31/2011    Class: Chronic    Total Time spent with patient: 45 minutes  Subjective:   [name] is a 60 y.o. male patient does not warrant admission.  HPI:  60 yo male who presented to the ED with suicidal ideations and a plan to jump off a bridge after drinking alcohol, history of depression.  "I feel all right today."  "Last night feeling suicidal, feel like that most of the time."  Denies feeling suicidal today, he wants to leave and take care of his cat, "Squeaky", that his son and exwife gave him on Father's Day two years ago; smiles when talking about the cat.  He currently lives in a trailer with a roommate who provides transportation for him along with his exwife.  Reports drinking a "couple of times a week" but does not feel this is an issue nor does he have withdrawal symptoms when he is not drinking.  His neurologist for his MS prescribes Wellbutrin and Lexapro for depression which he feels works "Burns."  Denies homicidal ideations, hallucinations, and drug abuse.  He is agreeable to return or call 911 if he begins to have suicidal ideations and/or plans to act on them.    Past Psychiatric History: Depression  Risk  to Self: Suicidal Ideation: Yes-Currently Present Suicidal Intent: Yes-Currently Present Is patient at risk for suicide?: Yes Suicidal Plan?: Yes-Currently Present Specify Current Suicidal Plan: jump off a bridge Access to Means: Yes Specify Access to Suicidal Means: [address] What has been your use of drugs/alcohol within the last 12 months?: alcohol use How many times?:  (multiple, per pt) Triggers for Past Attempts: Other (Comment) (similar stressors) Intentional Self Injurious Behavior: None Risk to Others: Homicidal Ideation: No Thoughts of Harm to Others: No Current Homicidal Intent: No Current Homicidal Plan: No Access to Homicidal Means: No History of harm to others?: No Assessment of Violence: None Noted Does patient have access to weapons?: No Criminal Charges Pending?: No Does patient have a court date: No Prior Inpatient Therapy: Prior Inpatient Therapy: Yes Prior Therapy Dates:  (last was 2 years ago) Prior Therapy Facilty/Provider(s):  (Pt reports multiple hospitalizations: [place], [place], [place]) Reason for Treatment: psych Prior Outpatient Therapy: Prior Outpatient Therapy: No Does patient have an ACCT team?: No Does patient have Intensive In-House Services?  : No Does patient have [facility] services? : No Does patient have P4CC services?: No  Past Medical History:  Past Medical History  Diagnosis Date  . Mental disorder   . Depression   . Neuromuscular disorder ([place])   . Multiple sclerosis, relapsing-remitting (HCC)   . MS (multiple sclerosis) ([place])   . Movement disorder   . Vision abnormalities     Past  Surgical History  Procedure Laterality Date  . Tonsillectomy     Family History:  Family History  Problem Relation Age of Onset  . Heart disease Mother   . Stroke Father    Family Psychiatric  History: None Social History:  History  Alcohol Use  . 0.0 oz/week  . 0 Standard drinks or equivalent per week    Comment: "hardly  drink except when I get into a hotel room, probably every 6 months or so"     History  Drug Use  . Yes    Comment: Marijuana- 2-3 bowls per day    Social History   Social History  . Marital Status: Legally Separated    Spouse Name: N/A  . Number of Children: N/A  . Years of Education: N/A   Social History Main Topics  . Smoking status: Current Every Day Smoker -- 0.50 packs/day for 36 years    Types: Cigarettes  . Smokeless tobacco: Never Used  . Alcohol Use: 0.0 oz/week    0 Standard drinks or equivalent per week     Comment: "hardly drink except when I get into a hotel room, probably every 6 months or so"  . Drug Use: Yes     Comment: Marijuana- 2-3 bowls per day  . Sexual Activity: Not Currently    Birth Control/ Protection: None   Other Topics Concern  . None   Social History Narrative   ** Merged History Encounter **       Additional Social History:    Allergies:  No Known Allergies  Labs:  Results for orders placed or performed during the hospital encounter of 07/15/15 (from the past 48 hour(s))  Comprehensive metabolic panel     Status: Abnormal   Collection Time: 07/16/15 12:01 AM  Result Value Ref Range   Sodium 143 135 - 145 mmol/L   Potassium 4.0 3.5 - 5.1 mmol/L   Chloride 113 (H) 101 - 111 mmol/L   CO2 19 (L) 22 - 32 mmol/L   Glucose, Bld 89 65 - 99 mg/dL   BUN 9 6 - 20 mg/dL   Creatinine, Ser 0.85 0.61 - 1.24 mg/dL   Calcium 8.6 (L) 8.9 - 10.3 mg/dL   Total Protein 7.0 6.5 - 8.1 g/dL   Albumin 4.2 3.5 - 5.0 g/dL   AST 21 15 - 41 U/L   ALT 15 (L) 17 - 63 U/L   Alkaline Phosphatase 63 38 - 126 U/L   Total Bilirubin 0.4 0.3 - 1.2 mg/dL   GFR calc non Af Amer >60 >60 mL/min   GFR calc Af Amer >60 >60 mL/min    Comment: (NOTE) The eGFR has been calculated using the CKD EPI equation. This calculation has not been validated in all clinical situations. eGFR's persistently <60 mL/min signify possible Chronic Kidney Disease.    Anion gap 11 5 - 15   Ethanol (ETOH)     Status: Abnormal   Collection Time: 07/16/15 12:01 AM  Result Value Ref Range   Alcohol, Ethyl (B) 88 (H) <5 mg/dL    Comment:        LOWEST DETECTABLE LIMIT FOR SERUM ALCOHOL IS 5 mg/dL FOR MEDICAL PURPOSES ONLY   Salicylate level     Status: None   Collection Time: 07/16/15 12:01 AM  Result Value Ref Range   Salicylate Lvl <9.3 2.8 - 30.0 mg/dL  Acetaminophen level     Status: Abnormal   Collection Time: 07/16/15 12:01 AM  Result Value Ref  Range   Acetaminophen (Tylenol), Serum <10 (L) 10 - 30 ug/mL    Comment:        THERAPEUTIC CONCENTRATIONS VARY SIGNIFICANTLY. A RANGE OF 10-30 ug/mL MAY BE AN EFFECTIVE CONCENTRATION FOR MANY PATIENTS. HOWEVER, SOME ARE BEST TREATED AT CONCENTRATIONS OUTSIDE THIS RANGE. ACETAMINOPHEN CONCENTRATIONS >150 ug/mL AT 4 HOURS AFTER INGESTION AND >50 ug/mL AT 12 HOURS AFTER INGESTION ARE OFTEN ASSOCIATED WITH TOXIC REACTIONS.   CBC     Status: None   Collection Time: 07/16/15 12:01 AM  Result Value Ref Range   WBC 5.5 4.0 - 10.5 K/uL   RBC 4.95 4.22 - 5.81 MIL/uL   Hemoglobin 16.2 13.0 - 17.0 g/dL   HCT 45.9 39.0 - 52.0 %   MCV 92.7 78.0 - 100.0 fL   MCH 32.7 26.0 - 34.0 pg   MCHC 35.3 30.0 - 36.0 g/dL   RDW 15.1 11.5 - 15.5 %   Platelets 195 150 - 400 K/uL  Urine rapid drug screen (hosp performed) (Not at [facility])     Status: Abnormal   Collection Time: 07/16/15  6:00 AM  Result Value Ref Range   Opiates NONE DETECTED NONE DETECTED   Cocaine NONE DETECTED NONE DETECTED   Benzodiazepines NONE DETECTED NONE DETECTED   Amphetamines NONE DETECTED NONE DETECTED   Tetrahydrocannabinol POSITIVE (A) NONE DETECTED   Barbiturates NONE DETECTED NONE DETECTED    Comment:        DRUG SCREEN FOR MEDICAL PURPOSES ONLY.  IF CONFIRMATION IS NEEDED FOR ANY PURPOSE, NOTIFY LAB WITHIN 5 DAYS.        LOWEST DETECTABLE LIMITS FOR URINE DRUG SCREEN Drug Class       Cutoff (ng/mL) Amphetamine      1000 Barbiturate       200 Benzodiazepine   903 Tricyclics       009 Opiates          300 Cocaine          300 THC              50     Current Facility-Administered Medications  Medication Dose Route Frequency Provider Last Rate Last Dose  . buPROPion (WELLBUTRIN XL) 24 hr tablet 300 mg  300 mg Oral Daily Virgel Manifold, MD   300 mg at 07/16/15 0856  . Dimethyl Fumarate CPDR 240 mg  240 mg Oral BID Virgel Manifold, MD   240 mg at 07/16/15 0857  . escitalopram (LEXAPRO) tablet 20 mg  20 mg Oral Daily Virgel Manifold, MD   20 mg at 07/16/15 0856  . nicotine (NICODERM CQ - dosed in mg/24 hours) patch 21 mg  21 mg Transdermal Daily Virgel Manifold, MD   21 mg at 07/16/15 2330   Current Outpatient Prescriptions  Medication Sig Dispense Refill  . buPROPion (WELLBUTRIN XL) 300 MG 24 hr tablet Take 1 tablet (300 mg total) by mouth daily. For depression 30 tablet 0  . escitalopram (LEXAPRO) 20 MG tablet Take 1 tablet (20 mg total) by mouth daily. For depression 30 tablet 0  . Dimethyl Fumarate (TECFIDERA) 240 MG CPDR Take 1 capsule (240 mg total) by mouth 2 (two) times daily. (Patient not taking: Reported on 07/15/2015) 180 capsule 0    Musculoskeletal: Strength & Muscle Tone: decreased Gait & Station: steady with a cane Patient leans: N/A  Psychiatric Specialty Exam: Review of Systems  Constitutional: Negative.   HENT: Negative.   Eyes: Negative.   Respiratory: Negative.   Cardiovascular: Negative.  Gastrointestinal: Negative.   Genitourinary: Negative.   Musculoskeletal: Negative.   Skin: Negative.   Neurological: Negative.   Endo/Heme/Allergies: Negative.   Psychiatric/Behavioral: Positive for depression.    Blood pressure 127/85, pulse 82, temperature 98.7 F (37.1 C), temperature source Oral, resp. rate 18, SpO2 91 %.There is no weight on file to calculate BMI.  General Appearance: Casual  Eye Contact::  Good  Speech:  Normal Rate  Volume:  Normal  Mood:  Depressed, mild  Affect:  Congruent  Thought  Process:  Coherent  Orientation:  Full (Time, Place, and Person)  Thought Content:  WDL  Suicidal Thoughts:  No  Homicidal Thoughts:  No  Memory:  Immediate;   Good Recent;   Good Remote;   Good  Judgement:  Fair  Insight:  Fair  Psychomotor Activity:  Normal  Concentration:  Good  Recall:  Good  Fund of Knowledge:Good  Language: Good  Akathisia:  No  Handed:  Right  AIMS (if indicated):     Assets:  Housing Leisure Time Resilience Social Support  ADL's:  Intact  Cognition: WNL  Sleep:      Treatment Plan Summary: Daily contact with patient to assess and evaluate symptoms and progress in treatment, Medication management and Plan alcohol induced mood disorder:  -Crisis stabilization -Medication management:  Wellbutrin 300 mg daily for depression continued along with his Lexapro 20 mg daily for depression and his medical medications -Individual and substance abuse counseling  Disposition: No evidence of imminent risk to self or others at present.    Waylan Boga, NP 07/16/2015 11:49 AM  Patient seen face-to-face for the psychiatric evaluation, case discussed with the treatment team and physician extender, and formulated treatment plan. Reviewed the information documented and agree with the treatment plan.  Silus Lanzo,JANARDHAHA R. 07/16/2015 5:14 PM

## 2015-07-16 NOTE — ED Notes (Addendum)
Pt is asleep on his stomach with regular respirations. He remains with a sitter due to SI. Pt does not appear in any distress. Pt appears very depressed with a flat , blunted affect. He stated he has been admitted to mental health facilities many times and does not feel like it has helped him. Pt stated he , "can't eat that food. " offered to get pt something else and he replied, "no."pt stated he wants to leave so he can go home and take care of his cat, Squeaky.Pt plans to phone his ex wife to come and pick him up. 11:25am -Report to Selena Batten.

## 2015-07-18 ENCOUNTER — Telehealth: Payer: Self-pay | Admitting: *Deleted

## 2015-07-18 NOTE — Telephone Encounter (Signed)
-----   Message from Asa Lente, MD sent at 07/16/2015  1:49 PM EDT ----- Please let him know that the lab work is normal.

## 2015-07-18 NOTE — Telephone Encounter (Signed)
I have spoken with [name] this morning and per RAS, advised that labs done in our office last week were normal.  He verbalized understanding of same/fim

## 2015-07-21 ENCOUNTER — Other Ambulatory Visit: Payer: Self-pay | Admitting: *Deleted

## 2015-07-21 ENCOUNTER — Telehealth: Payer: Self-pay | Admitting: Neurology

## 2015-07-21 MED ORDER — DIMETHYL FUMARATE 240 MG PO CPDR: 240.0000 mg | DELAYED_RELEASE_CAPSULE | Freq: Two times a day (BID) | ORAL | Status: DC

## 2015-07-21 NOTE — Telephone Encounter (Signed)
LMOM that [name] can call the [employer] [place] (phone # [phone]) and apply for assistance to help cover the cost of mri/fim

## 2015-07-21 NOTE — Telephone Encounter (Signed)
Tecfidera r/f by fax to [facility], per faxed request/fim

## 2015-07-21 NOTE — Telephone Encounter (Signed)
Pt said the co-pay for MRI is $295 and he is not able to pay this. Please call to discuss

## 2015-07-22 ENCOUNTER — Telehealth: Payer: Self-pay | Admitting: Neurology

## 2015-07-22 NOTE — Telephone Encounter (Signed)
Patient called this morning wanting to let Dr. Epimenio Foot know that he will not be moving forward with the MRI BRAIN W/WO due to finances.  Thanks!

## 2015-07-26 ENCOUNTER — Other Ambulatory Visit: Payer: Medicare HMO

## 2015-08-11 ENCOUNTER — Ambulatory Visit
Admission: RE | Admit: 2015-08-11 | Discharge: 2015-08-11 | Disposition: A | Discharge status: Home / Self Care | Payer: Medicare HMO | Source: Ambulatory Visit | Attending: Neurology | Admitting: Neurology

## 2015-08-11 DIAGNOSIS — R269 Unspecified abnormalities of gait and mobility: Secondary | ICD-10-CM | POA: Diagnosis not present

## 2015-08-11 DIAGNOSIS — G35 Multiple sclerosis: Secondary | ICD-10-CM | POA: Diagnosis not present

## 2015-08-11 MED ORDER — GADOBENATE DIMEGLUMINE 529 MG/ML IV SOLN
16.0000 mL | Freq: Once | INTRAVENOUS | Status: AC | PRN
  Administered 2015-08-11: 16 mL via INTRAVENOUS

## 2015-09-14 ENCOUNTER — Telehealth: Payer: Self-pay | Admitting: *Deleted

## 2015-09-14 NOTE — Telephone Encounter (Signed)
I have spoken with Octaviano this afternoon and per RAS, advised that MRI brain done in April did not show any new MS lesions.  He verbalized understanding of same/fim

## 2015-09-14 NOTE — Telephone Encounter (Signed)
-----   Message from Asa Lente, MD sent at 09/14/2015  9:16 AM EDT ----- Results from several MRIs performed in April were not sent to me until yesterday. We never called him with the results of her MRI.  Please let him know that the MRI does not show any new changes.

## 2016-01-16 ENCOUNTER — Encounter: Payer: Self-pay | Admitting: Neurology

## 2016-01-16 ENCOUNTER — Ambulatory Visit (INDEPENDENT_AMBULATORY_CARE_PROVIDER_SITE_OTHER): Payer: Medicare HMO | Admitting: Neurology

## 2016-01-16 VITALS — BP 158/96 | HR 92 | Resp 18 | Ht 71.0 in | Wt 183.0 lb

## 2016-01-16 DIAGNOSIS — Z79899 Other long term (current) drug therapy: Secondary | ICD-10-CM

## 2016-01-16 DIAGNOSIS — G35 Multiple sclerosis: Secondary | ICD-10-CM

## 2016-01-16 DIAGNOSIS — R5383 Other fatigue: Secondary | ICD-10-CM

## 2016-01-16 DIAGNOSIS — R200 Anesthesia of skin: Secondary | ICD-10-CM

## 2016-01-16 DIAGNOSIS — R3915 Urgency of urination: Secondary | ICD-10-CM | POA: Diagnosis not present

## 2016-01-16 DIAGNOSIS — R269 Unspecified abnormalities of gait and mobility: Secondary | ICD-10-CM | POA: Diagnosis not present

## 2016-01-16 NOTE — Progress Notes (Signed)
[facility]  PATIENT: [name] DOB: 1956/01/19  REFERRING DOCTOR OR PCP: Dr.  Shana Chute SOURCE: patient and records from [facility]  _________________________________   HISTORICAL  CHIEF COMPLAINT:  Chief Complaint  Patient presents with  . Multiple Sclerosis    Sts. he continues to tolerate Tecfidera well.  Sts. is having more tingling in his left arm./fim    HISTORY OF PRESENT ILLNESS:  [name] is a 60 year old man with relapsing remitting multiple sclerosis and bipolar disease.    Since the last visit he notes more tingling in the left arm.   Numbness increases with certain positions.  Numbness is intermittent several times a day for a few minutes at a time.    He notes no hand weakness and no fixed numbness.     Last visit, he had similar but milder right sided symptoms.     I personally reviewed the MRI of the brain and cervical spine from 08/12/2015. The MRI of the brain shows T2/FLAIR hyperintense foci in a pattern and configuration consistent with multiple sclerosis. There are no new lesions and there are no changes compared to his previous MRI. The MRI of the cervical spine shows some degenerative changes but no  MS plaques  MS:   He has been on Tecfidera since late 2013 but ran out 6-8 weeks ago.   Marland KitchenHe tolerates well and has not had any definite exacerbation while on it.    I personally reviewed the MRIs of the brain from 06/2014. The MRI of the brain showed plaques and classic distribution for MS. The spinal cord showed no plaques. He did have some cervical degenerative changes without any definite nerve root compression.qa  Gait/strength/sensation: Gait is unchanged with reduced balance but he has not fallen recently.   He notes mild weakness in both legs associated with some spasticity. He denies weakness or numbness in the arms.. 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   Bladder:  He has more hesitancy.   He also has some urgency, frequency.   He has occasional constipation but had diarrhea 2 weeks ago. He has not had any urinary or fecal incontinence in the past few years.    Vision:   He feels stable.  He notes diplopia when he looks either left or right. There is less likely diplopia when he looks straight ahead. Acuity is fairly symmetric. He denies any eye pain.  Fatigue/sleep: He reports some fatigue if more active but he generally has not been very active.    If tired and he rests for a while he feels better. He does not have to take naps. Amantadine helped in the past but stopped helping so he stopped..  .  Mood/cognition:   He notes some emotional lability/emotion at times, sometimes while watcxhing TV.    He has bipolar disease and has had multiple inpatient psychiatric admissions with various changes of medications over the years. He's been off lithium since 2011.  He has some depression but feels he is fairly stable.   Dr. Shana Chute, his PCP, writes his current psych medications.    MS History:   He presented with double vision in 2000.  MRI at that time showed lesions in the brain consistent with multiple sclerosis. He also had a lumbar puncture and the CSF was consistent with MS.   He  was started on Avonex.  He had another episode of diplopia followed by weakness in both legs 3 years later.    He was switched to Copaxone for a while. Unfortunate, he had an another severe exacerbation with his legs this. He was started on Tysabri once a month.   For several years, he stayed to [place] but stopped as it was difficult for him to get the infusions. In 2011, he switched to [place]. 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 He had one exacerbation with weakness in 2012. He then started Tecfidera in late 2013. He has generally  tolerated it well. He did not have any significant flushing or GI side effects. He has not had any definite exacerbations while on Tecfidera.    MRI's form 07/16/14 were reviewed (reports and images on PACS)  IMPRESSION: This is an abnormal MRI of the brain with and without contrast showing T2/flair hyperintense foci in the left pons and in the periventricular greater than juxtacortical and deep white matter of both hemispheres. These foci are in a pattern and configuration consistent with the clinical diagnosis of multiple sclerosis. There are no enhancing or diffusion restricted foci.   IMPRESSION: This is an abnormal MRI of the cervical spine with and without contrast showing mild multilevel degenerative changes in the mid cervical spine as detailed above. There is no nerve root compression but there could be dynamic impingement of Left C4, right C6 and C7 nerve roots due to degenerative changes at C3-C4, C5-C6 and C6-C7. There does not appear to be any plaques or other abnormalities within the spinal cord.  REVIEW OF SYSTEMS: Constitutional: No fevers, chills, sweats, or change in appetite.   Fatigue Eyes: Notes double vision.  No eye pain Ear, nose and throat: No hearing loss, ear pain, nasal congestion, sore throat Cardiovascular: No chest pain, palpitations Respiratory: No shortness of breath at rest or with exertion.   No wheezes.  Occ coughs (smokes) GastrointestinaI: No nausea, vomiting, diarrhea, abdominal pain, fecal incontinence Genitourinary: No dysuria, urinary retention.  Mild frequency.  No nocturia. Musculoskeletal: No neck pain, mild back pain Integumentary: No rash, pruritus, skin lesions Neurological: as above Psychiatric: Notes depression and anxiety.   He has bipolar disorder Endocrine: No palpitations, diaphoresis, change in appetite, change in weigh or increased thirst Hematologic/Lymphatic: No anemia, purpura, petechiae. Allergic/Immunologic: No itchy/runny  eyes, nasal congestion, recent allergic reactions, rashes  ALLERGIES: No Known Allergies  HOME MEDICATIONS:  Current Outpatient Prescriptions:  .  buPROPion (WELLBUTRIN XL) 300 MG 24 hr tablet, Take 1 tablet (300 mg total) by mouth daily. For depression, Disp: 30 tablet, Rfl: 0 .  Dimethyl Fumarate (TECFIDERA) 240 MG CPDR, Take 1 capsule (240 mg total) by mouth 2 (two) times daily., Disp: 180 capsule, Rfl: 3 .  escitalopram (LEXAPRO) 20 MG tablet, Take 1 tablet (20 mg total) by mouth daily. For depression, Disp: 30 tablet, Rfl: 0  PAST MEDICAL HISTORY: Past Medical History:  Diagnosis Date  . Depression   . Mental disorder   . Movement disorder   . MS (multiple sclerosis) (HCC)   . Multiple sclerosis, relapsing-remitting (HCC)   . Neuromuscular disorder (HCC)   . Vision abnormalities     PAST SURGICAL HISTORY: Past Surgical History:  Procedure Laterality Date  . TONSILLECTOMY      FAMILY HISTORY: Family History  Problem Relation Age of Onset  . Heart disease Mother   . Stroke Father     SOCIAL  HISTORY:  Social History   Social History  . Marital status: Legally Separated    Spouse name: N/A  . Number of children: N/A  . Years of education: N/A   Occupational History  . Not on file.   Social History Main Topics  . Smoking status: Current Every Day Smoker    Packs/day: 0.50    Years: 36.00    Types: Cigarettes  . Smokeless tobacco: Never Used  . Alcohol use 0.0 oz/week     Comment: "hardly drink except when I get into a hotel room, probably every 6 months or so"  . Drug use:      Comment: Marijuana- 2-3 bowls per day  . Sexual activity: Not Currently    Birth control/ protection: None   Other Topics Concern  . Not on file   Social History Narrative   ** Merged History Encounter **         PHYSICAL EXAM  Vitals:   01/16/16 1025  BP: (!) 158/96  Pulse: 92  Resp: 18  Weight: 183 lb (83 kg)  Height: 5\' 11"  (1.803 m)    Body mass index is  25.52 kg/m.   General: The patient is well-developed and well-nourished and in no acute distress  Musculoskeletal:  Back is nontender  Neurologic Exam  Mental status: The patient is alert and oriented x 3 at the time of the examination. The patient has apparent normal recent and remote memory, with an apparently normal attention span and concentration ability.   Speech is normal.  Cranial nerves: Extraocular movements appear full but he notes diplopia to far gaze to the right.      Facial symmetry is present. There is good facial sensation to soft touch bilaterally.Facial strength is normal.  Trapezius and sternocleidomastoid strength is normal. No dysarthria is noted.    No obvious hearing deficits are noted.  Motor:  Muscle bulk is normal.   Tone is increased in legs. Strength is  5 / 5 in all 4 extremities except 4+/5 in hip flexors   Sensory: Sensory testing is intact to soft touch and vibration sensation in all 4 extremities.  Coordination: Cerebellar testing reveals good finger-nose-finger and poor heel-to-shin bilaterally.  Gait and station: Station is wide stanced.   Gait is wide stanced with small stride.  There is some functional component.  Tandem gait is very wide. Romberg is negative.   Reflexes: Deep tendon reflexes are symmetric and 3+ bilaterally.    Spread at the knees, more on the     DIAGNOSTIC DATA (LABS, IMAGING, TESTING) - I reviewed patient records, labs, notes, testing and imaging myself where available.  Lab Results  Component Value Date   WBC 5.5 07/16/2015   HGB 16.2 07/16/2015   HCT 45.9 07/16/2015   MCV 92.7 07/16/2015   PLT 195 07/16/2015      Component Value Date/Time   NA 143 07/16/2015 0001   NA 146 (H) 07/15/2015 1134   K 4.0 07/16/2015 0001   CL 113 (H) 07/16/2015 0001   CO2 19 (L) 07/16/2015 0001   GLUCOSE 89 07/16/2015 0001   BUN 9 07/16/2015 0001   BUN 10 07/15/2015 1134   CREATININE 0.85 07/16/2015 0001   CALCIUM 8.6 (L)  07/16/2015 0001   PROT 7.0 07/16/2015 0001   PROT 6.6 07/15/2015 1134   ALBUMIN 4.2 07/16/2015 0001   ALBUMIN 4.0 07/15/2015 1134   AST 21 07/16/2015 0001   ALT 15 (L) 07/16/2015 0001   ALKPHOS 63  07/16/2015 0001   BILITOT 0.4 07/16/2015 0001   BILITOT 0.3 07/15/2015 1134   GFRNONAA >60 07/16/2015 0001   GFRAA >60 07/16/2015 0001    No results found for: VITAMINB12 Lab Results  Component Value Date   TSH 1.613 07/11/2015     ASSESSMENT AND PLAN  Multiple sclerosis (HCC) - Plan: CBC with Differential/Platelet  Other fatigue  Gait disturbance  Urinary urgency  High risk medication use - Plan: CBC with Differential/Platelet  Numbness   1.  Continue Tecfidera.  If he has any interruption in his supply, he needs to let us know so we can make sure to get him assistance. I will check a CBC with differential to make sure that he does not have any lymphopenia. 2.   He is advised to try to be more active and exercise more if possible. 3.   Medicine the left hand is more likely to be on or nerve mediated then MS related. He is advised to had any services that his elbows rested on and to stay active. If symptoms worsen, consider EMG/NCV 4.   He will return to see me in 6 months or sooner if he has new or worsening neurologic symptoms.   Richard A. Epimenio FootSater, MD, PhD 01/16/2016, 11:21 AM Certified in Neurology, Clinical Neurophysiology, Sleep Medicine, Pain Medicine and Neuroimaging  [facility] [address], [address] [place], [place] [address] [phone]

## 2016-01-17 LAB — CBC WITH DIFFERENTIAL/PLATELET
Basophils Absolute: 0 10*3/uL (ref 0.0–0.2)
Basos: 0 %
EOS (ABSOLUTE): 0.1 10*3/uL (ref 0.0–0.4)
Eos: 2 %
Hematocrit: 44.9 % (ref 37.5–51.0)
Hemoglobin: 15.2 g/dL (ref 12.6–17.7)
Immature Grans (Abs): 0 10*3/uL (ref 0.0–0.1)
Immature Granulocytes: 0 %
Lymphocytes Absolute: 0.8 10*3/uL (ref 0.7–3.1)
Lymphs: 16 %
MCH: 31.7 pg (ref 26.6–33.0)
MCHC: 33.9 g/dL (ref 31.5–35.7)
MCV: 94 fL (ref 79–97)
Monocytes Absolute: 0.3 10*3/uL (ref 0.1–0.9)
Monocytes: 6 %
Neutrophils Absolute: 3.6 10*3/uL (ref 1.4–7.0)
Neutrophils: 76 %
Platelets: 235 10*3/uL (ref 150–379)
RBC: 4.8 x10E6/uL (ref 4.14–5.80)
RDW: 14.4 % (ref 12.3–15.4)
WBC: 4.7 10*3/uL (ref 3.4–10.8)

## 2016-02-28 ENCOUNTER — Encounter (HOSPITAL_COMMUNITY): Payer: Self-pay | Admitting: Emergency Medicine

## 2016-02-28 ENCOUNTER — Emergency Department (HOSPITAL_COMMUNITY)
Admission: EM | Admit: 2016-02-28 | Discharge: 2016-02-29 | Disposition: A | Discharge status: Other Acute Inpatient Hospital | Payer: Medicare HMO | Attending: Emergency Medicine | Admitting: Emergency Medicine

## 2016-02-28 DIAGNOSIS — F1094 Alcohol use, unspecified with alcohol-induced mood disorder: Secondary | ICD-10-CM | POA: Diagnosis not present

## 2016-02-28 DIAGNOSIS — G35 Multiple sclerosis: Secondary | ICD-10-CM | POA: Insufficient documentation

## 2016-02-28 DIAGNOSIS — F329 Major depressive disorder, single episode, unspecified: Secondary | ICD-10-CM | POA: Insufficient documentation

## 2016-02-28 DIAGNOSIS — Z049 Encounter for examination and observation for unspecified reason: Secondary | ICD-10-CM

## 2016-02-28 DIAGNOSIS — Z79899 Other long term (current) drug therapy: Secondary | ICD-10-CM | POA: Diagnosis not present

## 2016-02-28 DIAGNOSIS — F17210 Nicotine dependence, cigarettes, uncomplicated: Secondary | ICD-10-CM | POA: Insufficient documentation

## 2016-02-28 DIAGNOSIS — R45851 Suicidal ideations: Secondary | ICD-10-CM

## 2016-02-28 DIAGNOSIS — F1010 Alcohol abuse, uncomplicated: Secondary | ICD-10-CM | POA: Diagnosis present

## 2016-02-28 DIAGNOSIS — Z8249 Family history of ischemic heart disease and other diseases of the circulatory system: Secondary | ICD-10-CM | POA: Diagnosis not present

## 2016-02-28 DIAGNOSIS — Z823 Family history of stroke: Secondary | ICD-10-CM | POA: Diagnosis not present

## 2016-02-28 DIAGNOSIS — F314 Bipolar disorder, current episode depressed, severe, without psychotic features: Secondary | ICD-10-CM | POA: Diagnosis present

## 2016-02-28 NOTE — ED Triage Notes (Signed)
Pt called [employer] and reported having SI/HI. Pt stated that his medications has not been working and he feels like "a volcano that is going to explode". Pt wants to be seen to see if there are other options for his medication regimen.

## 2016-02-28 NOTE — ED Provider Notes (Signed)
[facility]-EMERGENCY DEPT Provider Note   CSN: [id number] Arrival date & time: 02/28/16  2258  By signing my name below, I, Alyssa GroveMartin Green, attest that this documentation has been prepared under the direction and in the presence of Shon Batonourtney F Horton, MD. Electronically Signed: Alyssa GroveMartin Green, ED Scribe. 02/28/16. 11:50 PM.   History   Chief Complaint No chief complaint on file.  The history is provided by the patient. No language interpreter was used.   HPI Comments: [name] is a 60 y.o. male with PMHx of Depression and MS who presents to the Emergency Department complaining of suicidal ideations beginning today. He states he feels as if he is "about to explode". He reports hx of multiple suicide attempts with the most previous occurring 2-3 years ago by overdosing on sleeping pills. Pt does not have any plans for hurting himself today. He is compliant with medications, which include Wellbutrin XL, Lexapro, and Tecfidera and he states they "normally work well". Pt has had a few beers today. He denies chest pain, shortness of breath and homicidal ideation.  Past Medical History:  Diagnosis Date  . Depression   . Mental disorder   . Movement disorder   . MS (multiple sclerosis) (HCC)   . Multiple sclerosis, relapsing-remitting (HCC)   . Neuromuscular disorder (HCC)   . Vision abnormalities     Patient Active Problem List   Diagnosis Date Noted  . Numbness 01/16/2016  . Alcohol abuse 07/16/2015  . 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 Other fatigue 06/29/2014  . Gait disturbance 06/29/2014  . Urinary urgency 06/29/2014  . High risk medication use 06/29/2014  . 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 Multiple sclerosis (HCC) 10/31/2011    Class: Chronic  . Dysthymia 05/11/2011    Past Surgical History:  Procedure Laterality Date  . TONSILLECTOMY         Home Medications    Prior to Admission medications   Medication Sig  Start Date End Date Taking? Authorizing Provider  buPROPion (WELLBUTRIN XL) 300 MG 24 hr tablet Take 1 tablet (300 mg total) by mouth daily. For depression 09/25/12  Yes Thermon LeylandLaura A Davis, NP  Dimethyl Fumarate (TECFIDERA) 240 MG CPDR Take 1 capsule (240 mg total) by mouth 2 (two) times daily. 07/21/15  Yes Asa Lenteichard A Sater, MD  escitalopram (LEXAPRO) 20 MG tablet Take 1 tablet (20 mg total) by mouth daily. For depression 09/25/12  Yes Thermon LeylandLaura A Davis, NP    Family History Family History  Problem Relation Age of Onset  . Heart disease Mother   . Stroke Father     Social History Social History  Substance Use Topics  . Smoking status: Current Every Day Smoker    Packs/day: 0.50    Years: 36.00    Types: Cigarettes  . Smokeless tobacco: Never Used  . Alcohol use 0.0 oz/week     Comment: "hardly drink except when I get into a hotel room, probably every 6 months or so"     Allergies   Patient has no known allergies.   Review of Systems Review of Systems  Respiratory: Negative for shortness of breath.   Cardiovascular: Negative for chest pain.  Psychiatric/Behavioral: Positive for suicidal ideas.       - Homicidal ideation  All other systems reviewed and are negative.    Physical Exam Updated Vital Signs BP 113/80 (BP Location: Left Arm)   Pulse 94   Temp 98.3 F (36.8  C) (Oral)   Resp 16   SpO2 96%   Physical Exam  Constitutional: He is oriented to person, place, and time. No distress.  Disheveled, smells of smoke  HENT:  Head: Normocephalic and atraumatic.  Cardiovascular: Normal rate, regular rhythm and normal heart sounds.   No murmur heard. Pulmonary/Chest: Effort normal and breath sounds normal. No respiratory distress. He has no wheezes.  Abdominal: Soft. There is no tenderness.  Musculoskeletal: He exhibits no edema.  Neurological: He is alert and oriented to person, place, and time.  Skin: Skin is warm and dry.  Psychiatric: He has a normal mood and affect.    Nursing note and vitals reviewed.    ED Treatments / Results  DIAGNOSTIC STUDIES: Oxygen Saturation is 96% on RA, adequate by my interpretation.    COORDINATION OF CARE: 11:41 PM Discussed treatment plan with pt at bedside which includes consult to TTS and pt agreed to plan.  Labs (all labs ordered are listed, but only abnormal results are displayed) Labs Reviewed  COMPREHENSIVE METABOLIC PANEL - Abnormal; Notable for the following:       Result Value   CO2 20 (*)    Calcium 8.8 (*)    ALT 16 (*)    All other components within normal limits  ETHANOL - Abnormal; Notable for the following:    Alcohol, Ethyl (B) 112 (*)    All other components within normal limits  ACETAMINOPHEN LEVEL - Abnormal; Notable for the following:    Acetaminophen (Tylenol), Serum <10 (*)    All other components within normal limits  RAPID URINE DRUG SCREEN, HOSP PERFORMED - Abnormal; Notable for the following:    Tetrahydrocannabinol POSITIVE (*)    All other components within normal limits  SALICYLATE LEVEL  CBC    EKG  EKG Interpretation None       Radiology No results found.  Procedures Procedures (including critical care time)  Medications Ordered in ED Medications - No data to display   Initial Impression / Assessment and Plan / ED Course  I have reviewed the triage vital signs and the nursing notes.  Pertinent labs & imaging results that were available during my care of the patient were reviewed by me and considered in my medical decision making (see chart for details).  Clinical Course    Patient presents with suicidal thoughts. On multiple psychiatric medication. Reports increasing depression. No definitive plan. Otherwise nontoxic. Denies ingestions. Labwork reassuring. Patient medically cleared. It TTS recommending inpatient treatment.  I personally performed the services described in this documentation, which was scribed in my presence. The recorded information has been  reviewed and is accurate.   Final Clinical Impressions(s) / ED Diagnoses   Final diagnoses:  Suicidal ideation    New Prescriptions New Prescriptions   No medications on file     Shon Baton, MD 02/29/16 [phone]

## 2016-02-29 ENCOUNTER — Emergency Department (HOSPITAL_COMMUNITY): Payer: Medicare HMO

## 2016-02-29 DIAGNOSIS — Z79899 Other long term (current) drug therapy: Secondary | ICD-10-CM

## 2016-02-29 DIAGNOSIS — F329 Major depressive disorder, single episode, unspecified: Secondary | ICD-10-CM | POA: Diagnosis not present

## 2016-02-29 DIAGNOSIS — G35 Multiple sclerosis: Secondary | ICD-10-CM

## 2016-02-29 DIAGNOSIS — Z8249 Family history of ischemic heart disease and other diseases of the circulatory system: Secondary | ICD-10-CM | POA: Diagnosis not present

## 2016-02-29 DIAGNOSIS — R45851 Suicidal ideations: Secondary | ICD-10-CM

## 2016-02-29 DIAGNOSIS — F17210 Nicotine dependence, cigarettes, uncomplicated: Secondary | ICD-10-CM

## 2016-02-29 DIAGNOSIS — Z823 Family history of stroke: Secondary | ICD-10-CM

## 2016-02-29 LAB — CBC
HCT: 44.2 % (ref 39.0–52.0)
Hemoglobin: 15.2 g/dL (ref 13.0–17.0)
MCH: 32.5 pg (ref 26.0–34.0)
MCHC: 34.4 g/dL (ref 30.0–36.0)
MCV: 94.6 fL (ref 78.0–100.0)
Platelets: 207 10*3/uL (ref 150–400)
RBC: 4.67 MIL/uL (ref 4.22–5.81)
RDW: 14.6 % (ref 11.5–15.5)
WBC: 4.5 10*3/uL (ref 4.0–10.5)

## 2016-02-29 LAB — URINALYSIS, ROUTINE W REFLEX MICROSCOPIC
Bilirubin Urine: NEGATIVE
Glucose, UA: NEGATIVE mg/dL
Hgb urine dipstick: NEGATIVE
Ketones, ur: NEGATIVE mg/dL
Leukocytes, UA: NEGATIVE
Nitrite: NEGATIVE
Protein, ur: NEGATIVE mg/dL
Specific Gravity, Urine: 1.009 (ref 1.005–1.030)
pH: 5.5 (ref 5.0–8.0)

## 2016-02-29 LAB — COMPREHENSIVE METABOLIC PANEL
ALT: 16 U/L — ABNORMAL LOW (ref 17–63)
AST: 20 U/L (ref 15–41)
Albumin: 4.2 g/dL (ref 3.5–5.0)
Alkaline Phosphatase: 57 U/L (ref 38–126)
Anion gap: 11 (ref 5–15)
BUN: 15 mg/dL (ref 6–20)
CO2: 20 mmol/L — ABNORMAL LOW (ref 22–32)
Calcium: 8.8 mg/dL — ABNORMAL LOW (ref 8.9–10.3)
Chloride: 106 mmol/L (ref 101–111)
Creatinine, Ser: 0.94 mg/dL (ref 0.61–1.24)
GFR calc Af Amer: >60 mL/min (ref >60)
GFR calc non Af Amer: >60 mL/min (ref >60)
Glucose, Bld: 90 mg/dL (ref 65–99)
Potassium: 4 mmol/L (ref 3.5–5.1)
Sodium: 137 mmol/L (ref 135–145)
Total Bilirubin: 0.4 mg/dL (ref 0.3–1.2)
Total Protein: 7.4 g/dL (ref 6.5–8.1)

## 2016-02-29 LAB — TSH: TSH: 2.38 u[IU]/mL (ref 0.350–4.500)

## 2016-02-29 LAB — RAPID URINE DRUG SCREEN, HOSP PERFORMED
Amphetamines: NOT DETECTED
Barbiturates: NOT DETECTED
Benzodiazepines: NOT DETECTED
Cocaine: NOT DETECTED
Opiates: NOT DETECTED
Tetrahydrocannabinol: POSITIVE — AB

## 2016-02-29 LAB — SALICYLATE LEVEL: Salicylate Lvl: <7 mg/dL (ref 2.8–30.0)

## 2016-02-29 LAB — ETHANOL: Alcohol, Ethyl (B): 112 mg/dL — ABNORMAL HIGH (ref <5)

## 2016-02-29 LAB — ACETAMINOPHEN LEVEL: Acetaminophen (Tylenol), Serum: <10 ug/mL — ABNORMAL LOW (ref 10–30)

## 2016-02-29 MED ORDER — HYDROXYZINE HCL 25 MG PO TABS: 25.0000 mg | ORAL_TABLET | Freq: Three times a day (TID) | ORAL | Status: DC | PRN

## 2016-02-29 MED ORDER — LORAZEPAM 1 MG PO TABS: 1.0000 mg | ORAL_TABLET | Freq: Four times a day (QID) | ORAL | Status: DC | PRN

## 2016-02-29 MED ORDER — BUPROPION HCL ER (XL) 150 MG PO TB24
300.0000 mg | ORAL_TABLET | Freq: Every day | ORAL | Status: DC
  Administered 2016-02-29: 300 mg via ORAL
  Filled 2016-02-29: qty 2

## 2016-02-29 MED ORDER — DIMETHYL FUMARATE 240 MG PO CPDR: 240.0000 mg | DELAYED_RELEASE_CAPSULE | Freq: Two times a day (BID) | ORAL | Status: DC

## 2016-02-29 MED ORDER — ESCITALOPRAM OXALATE 10 MG PO TABS
20.0000 mg | ORAL_TABLET | Freq: Every day | ORAL | Status: DC
  Administered 2016-02-29: 20 mg via ORAL
  Filled 2016-02-29: qty 2
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
## 2016-02-29 NOTE — ED Notes (Signed)
Patient ambulated to bathroom without assistance and returned to resting on stretcher.  Patient has no complaints at this time.  Sitter at bedside.

## 2016-02-29 NOTE — ED Provider Notes (Signed)
EKG Interpretation  Date/Time:  Wednesday February 29 2016 10:29:56 EST Ventricular Rate:  79 PR Interval:  124 QRS Duration: 90 QT Interval:  374 QTC Calculation: 428 R Axis:   67 Text Interpretation:  Normal sinus rhythm Normal ECG No significant change was found Confirmed by Daton Szilagyi  MD, Caryn Bee ([id number]) on 02/29/2016 10:35:54 AM         Azalia Bilis, MD 02/29/16 1036

## 2016-02-29 NOTE — ED Notes (Signed)
Pt ambulates with a cane.

## 2016-02-29 NOTE — ED Notes (Signed)
Patient transported to X-ray 

## 2016-02-29 NOTE — BH Assessment (Signed)
[facility] Assessment Progress Note  Per Thedore Mins, MD, this pt requires psychiatric hospitalization.  At 14:28 Morrie Sheldon calls from [facility].  Pt has been accepted to their facility by Dr Wendall Stade to [facility].  [name], DNP concurs with this decision, as does the pt, who is currently under voluntary status.  Pt's nurse, Vernona Rieger, has been notified, and agrees to call report to [phone].  Pt is to be transported via [facility].  They are to be instructed to take pt to the [facility].   [name], MA Triage Specialist [phone]

## 2016-02-29 NOTE — ED Notes (Signed)
Will do vital signs when pt wakes up.

## 2016-02-29 NOTE — Consult Note (Signed)
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 Numbness [R20.0] 01/16/2016  . Alcohol abuse [F10.10] 07/16/2015  . Alcohol-induced mood disorder ([place]) [F10.94] 07/16/2015  . Other fatigue [R53.83] 06/29/2014  . Gait disturbance [R26.9] 06/29/2014  . Urinary urgency [R39.15] 06/29/2014  . High risk medication use [Z79.899] 06/29/2014  . Multiple sclerosis ([place]) [G35] 10/31/2011    Class: Chronic  . Dysthymia [F34.1] 05/11/2011    Total Time spent with patient: 45 minutes  Subjective:   [name] is a 60 y.o. male patient admitted with suicidal ideation.  HPI:  [name] is an 60 y.o. male who reports history of Depression getting worse in the last 3 years due Multiple Sclerosis. Patient reports being overwhelmed since his room mate twin sister got admitted to an hospital in [place]. He is endorsing progressively worsening depressive symptoms and  suicidal thoughts. He states that he bottles everything inside him and does not want to "explode'' like a Volcano. He reports prior history of suicide attempt few years ago.Patient reports occasional Cannabis and Alcohol use to "level myself''. He is unable to contract for safety.  Past Psychiatric History: as above  Risk to Self: Suicidal Ideation: Yes-Currently Present Is patient at risk for suicide?: Yes Suicidal Plan?: No Access to Means: Yes Specify Access to Suicidal Means: Pt reported if he would have a plan it would be to take sleeping pills.  What has been your use of drugs/alcohol within the last 12 months?: Pt reported drinking a few beers, and smoking three  packs of cigarettes daily.  How many times?:  (Multiple) Other Self Harm Risks:  (NA) Triggers for Past Attempts: Unknown Intentional Self Injurious Behavior: None Risk to Others: Homicidal Ideation: No (Pt denies. ) Thoughts of Harm to Others: No Current Homicidal Intent: No Current Homicidal Plan: No Access to Homicidal Means: No Identified Victim: NA History of harm to others?: No Assessment of Violence: None Noted Violent Behavior Description: NA Does patient have access to weapons?: No (Pt denies. ) Criminal Charges Pending?: No Does patient have a court date: No Prior Inpatient Therapy: Prior Inpatient Therapy: Yes Prior Therapy Dates: Multiple  Prior Therapy Facilty/Provider(s): [place], [place], [place] Reason for Treatment: Depression, SI Prior Outpatient Therapy: Prior Outpatient Therapy: No Prior Therapy Dates:  (NA) Prior Therapy Facilty/Provider(s): NA Reason for Treatment: NA Does patient have an ACCT team?: No Does patient have Intensive In-House Services?  : No Does patient have [facility] services? : No Does patient have P4CC services?: No  Past Medical History:  Past Medical History:  Diagnosis Date  . Depression   . Mental disorder   . Movement disorder   . MS (multiple sclerosis) ([place])   . Multiple sclerosis, relapsing-remitting ([place])   . Neuromuscular disorder ([place])   . Vision abnormalities     Past Surgical History:  Procedure Laterality Date  . TONSILLECTOMY     Family History:  Family History  Problem Relation Age of Onset  . Heart disease Mother   . Stroke Father    Family Psychiatric  History:  Social History:  History  Alcohol Use  . 0.0 oz/week  Comment: "hardly drink except when I get into a hotel room, probably every 6 months or so"     History  Drug Use    Comment: Marijuana- 2-3 bowls per day    Social History   Social History  . Marital status: Legally Separated    Spouse name: N/A  . Number of children: N/A  . Years  of education: N/A   Social History Main Topics  . Smoking status: Current Every Day Smoker    Packs/day: 0.50    Years: 36.00    Types: Cigarettes  . Smokeless tobacco: Never Used  . Alcohol use 0.0 oz/week     Comment: "hardly drink except when I get into a hotel room, probably every 6 months or so"  . Drug use:      Comment: Marijuana- 2-3 bowls per day  . Sexual activity: Not Currently    Birth control/ protection: None   Other Topics Concern  . Not on file   Social History Narrative   ** Merged History Encounter **       Additional Social History:    Allergies:  No Known Allergies  Labs:  Results for orders placed or performed during the hospital encounter of 02/28/16 (from the past 48 hour(s))  Comprehensive metabolic panel     Status: Abnormal   Collection Time: 02/29/16 12:05 AM  Result Value Ref Range   Sodium 137 135 - 145 mmol/L   Potassium 4.0 3.5 - 5.1 mmol/L   Chloride 106 101 - 111 mmol/L   CO2 20 (L) 22 - 32 mmol/L   Glucose, Bld 90 65 - 99 mg/dL   BUN 15 6 - 20 mg/dL   Creatinine, Ser 0.94 0.61 - 1.24 mg/dL   Calcium 8.8 (L) 8.9 - 10.3 mg/dL   Total Protein 7.4 6.5 - 8.1 g/dL   Albumin 4.2 3.5 - 5.0 g/dL   AST 20 15 - 41 U/L   ALT 16 (L) 17 - 63 U/L   Alkaline Phosphatase 57 38 - 126 U/L   Total Bilirubin 0.4 0.3 - 1.2 mg/dL   GFR calc non Af Amer >60 >60 mL/min   GFR calc Af Amer >60 >60 mL/min    Comment: (NOTE) The eGFR has been calculated using the CKD EPI equation. This calculation has not been validated in all clinical situations. eGFR's persistently <60 mL/min signify possible Chronic Kidney Disease.    Anion gap 11 5 - 15  Ethanol     Status: Abnormal   Collection Time: 02/29/16 12:05 AM  Result Value Ref Range   Alcohol, Ethyl (B) 112 (H) <5 mg/dL    Comment:        LOWEST DETECTABLE LIMIT FOR SERUM ALCOHOL IS 5 mg/dL FOR MEDICAL PURPOSES ONLY   Salicylate level     Status: None   Collection Time: 02/29/16 12:05 AM  Result  Value Ref Range   Salicylate Lvl <0.2 2.8 - 30.0 mg/dL  Acetaminophen level     Status: Abnormal   Collection Time: 02/29/16 12:05 AM  Result Value Ref Range   Acetaminophen (Tylenol), Serum <10 (L) 10 - 30 ug/mL    Comment:        THERAPEUTIC CONCENTRATIONS VARY SIGNIFICANTLY. A RANGE OF 10-30 ug/mL MAY BE AN EFFECTIVE CONCENTRATION FOR MANY PATIENTS. HOWEVER, SOME ARE BEST TREATED AT CONCENTRATIONS OUTSIDE THIS RANGE. ACETAMINOPHEN CONCENTRATIONS >150 ug/mL AT 4 HOURS AFTER INGESTION AND >50 ug/mL AT 12 HOURS AFTER INGESTION ARE OFTEN ASSOCIATED WITH TOXIC REACTIONS.  cbc     Status: None   Collection Time: 02/29/16 12:05 AM  Result Value Ref Range   WBC 4.5 4.0 - 10.5 K/uL   RBC 4.67 4.22 - 5.81 MIL/uL   Hemoglobin 15.2 13.0 - 17.0 g/dL   HCT 44.2 39.0 - 52.0 %   MCV 94.6 78.0 - 100.0 fL   MCH 32.5 26.0 - 34.0 pg   MCHC 34.4 30.0 - 36.0 g/dL   RDW 14.6 11.5 - 15.5 %   Platelets 207 150 - 400 K/uL  Rapid urine drug screen (hospital performed)     Status: Abnormal   Collection Time: 02/29/16  1:30 AM  Result Value Ref Range   Opiates NONE DETECTED NONE DETECTED   Cocaine NONE DETECTED NONE DETECTED   Benzodiazepines NONE DETECTED NONE DETECTED   Amphetamines NONE DETECTED NONE DETECTED   Tetrahydrocannabinol POSITIVE (A) NONE DETECTED   Barbiturates NONE DETECTED NONE DETECTED    Comment:        DRUG SCREEN FOR MEDICAL PURPOSES ONLY.  IF CONFIRMATION IS NEEDED FOR ANY PURPOSE, NOTIFY LAB WITHIN 5 DAYS.        LOWEST DETECTABLE LIMITS FOR URINE DRUG SCREEN Drug Class       Cutoff (ng/mL) Amphetamine      1000 Barbiturate      200 Benzodiazepine   226 Tricyclics       333 Opiates          300 Cocaine          300 THC              50   Urinalysis, Routine w reflex microscopic (not at [facility])     Status: None   Collection Time: 02/29/16  1:30 AM  Result Value Ref Range   Color, Urine YELLOW YELLOW   APPearance CLEAR CLEAR   Specific Gravity, Urine 1.009  1.005 - 1.030   pH 5.5 5.0 - 8.0   Glucose, UA NEGATIVE NEGATIVE mg/dL   Hgb urine dipstick NEGATIVE NEGATIVE   Bilirubin Urine NEGATIVE NEGATIVE   Ketones, ur NEGATIVE NEGATIVE mg/dL   Protein, ur NEGATIVE NEGATIVE mg/dL   Nitrite NEGATIVE NEGATIVE   Leukocytes, UA NEGATIVE NEGATIVE    Comment: MICROSCOPIC NOT DONE ON URINES WITH NEGATIVE PROTEIN, BLOOD, LEUKOCYTES, NITRITE, OR GLUCOSE <1000 mg/dL.    Current Facility-Administered Medications  Medication Dose Route Frequency Provider Last Rate Last Dose  . buPROPion (WELLBUTRIN XL) 24 hr tablet 300 mg  300 mg Oral Daily Patrecia Pour, NP      . Dimethyl Fumarate CPDR 240 mg  240 mg Oral BID Patrecia Pour, NP      . escitalopram (LEXAPRO) tablet 20 mg  20 mg Oral Daily Patrecia Pour, NP      . hydrOXYzine (ATARAX/VISTARIL) tablet 25 mg  25 mg Oral TID PRN Corena Pilgrim, MD      . LORazepam (ATIVAN) tablet 1 mg  1 mg Oral Q6H PRN Patrecia Pour, NP       Current Outpatient Prescriptions  Medication Sig Dispense Refill  . buPROPion (WELLBUTRIN XL) 300 MG 24 hr tablet Take 1 tablet (300 mg total) by mouth daily. For depression 30 tablet 0  . Dimethyl Fumarate (TECFIDERA) 240 MG CPDR Take 1 capsule (240 mg total) by mouth 2 (two) times daily. 180 capsule 3  . escitalopram (LEXAPRO) 20 MG tablet Take 1 tablet (20 mg total) by mouth daily. For depression 30 tablet 0    Musculoskeletal: Strength &  Muscle Tone: within normal limits Gait & Station: normal Patient leans: N/A  Psychiatric Specialty Exam: Physical Exam  Psychiatric: His speech is normal. Judgment normal. He is slowed and withdrawn. Cognition and memory are normal. He exhibits a depressed mood. He expresses suicidal ideation.    Review of Systems  Constitutional: Positive for malaise/fatigue.  HENT: Negative.   Eyes: Negative.   Respiratory: Negative.   Cardiovascular: Negative.   Gastrointestinal: Negative.   Genitourinary: Negative.   Musculoskeletal: Negative.    Skin: Negative.   Neurological: Negative.   Endo/Heme/Allergies: Negative.   Psychiatric/Behavioral: Positive for depression, substance abuse and suicidal ideas. The patient has insomnia.     Blood pressure 144/87, pulse 88, temperature 97.9 F (36.6 C), temperature source Oral, resp. rate 16, SpO2 97 %.There is no height or weight on file to calculate BMI.  General Appearance: Casual  Eye Contact:  Good  Speech:  Clear and Coherent  Volume:  Normal  Mood:  Depressed and Hopeless  Affect:  Constricted  Thought Process:  Coherent  Orientation:  Full (Time, Place, and Person)  Thought Content:  Logical  Suicidal Thoughts:  Yes.  without intent/plan  Homicidal Thoughts:  No  Memory:  Immediate;   Good Recent;   Fair Remote;   Good  Judgement:  Poor  Insight:  Shallow  Psychomotor Activity:  Decreased  Concentration:  Concentration: Fair and Attention Span: Fair  Recall:  Good  Fund of Knowledge:  Good  Language:  Good  Akathisia:  No  Handed:  Right  AIMS (if indicated):     Assets:  Communication Skills Desire for Improvement  ADL's:  Intact  Cognition:  WNL  Sleep:   poor     Treatment Plan Summary: Daily contact with patient to assess and evaluate symptoms and progress in treatment, Medication management. Continue Wellbutrin XL 300 mg daily and Lexapro 20 mg for depression.  Disposition: Recommend psychiatric Inpatient admission when medically cleared. Supportive therapy provided about ongoing stressors.  Corena Pilgrim, MD 02/29/2016 11:11 AM

## 2016-03-22 ENCOUNTER — Telehealth (HOSPITAL_COMMUNITY): Payer: Self-pay | Admitting: Cardiology

## 2016-04-05 ENCOUNTER — Ambulatory Visit (INDEPENDENT_AMBULATORY_CARE_PROVIDER_SITE_OTHER): Payer: Medicare HMO | Admitting: Neurology

## 2016-04-05 ENCOUNTER — Encounter: Payer: Self-pay | Admitting: Neurology

## 2016-04-05 VITALS — BP 116/78 | HR 94 | Resp 20 | Ht 71.0 in | Wt 183.0 lb

## 2016-04-05 DIAGNOSIS — F329 Major depressive disorder, single episode, unspecified: Secondary | ICD-10-CM | POA: Diagnosis not present

## 2016-04-05 DIAGNOSIS — R3915 Urgency of urination: Secondary | ICD-10-CM

## 2016-04-05 DIAGNOSIS — R200 Anesthesia of skin: Secondary | ICD-10-CM | POA: Diagnosis not present

## 2016-04-05 DIAGNOSIS — R269 Unspecified abnormalities of gait and mobility: Secondary | ICD-10-CM | POA: Diagnosis not present

## 2016-04-05 DIAGNOSIS — Z79899 Other long term (current) drug therapy: Secondary | ICD-10-CM | POA: Diagnosis not present

## 2016-04-05 DIAGNOSIS — G35 Multiple sclerosis: Secondary | ICD-10-CM

## 2016-04-05 DIAGNOSIS — R5383 Other fatigue: Secondary | ICD-10-CM

## 2016-04-05 DIAGNOSIS — F319 Bipolar disorder, unspecified: Secondary | ICD-10-CM | POA: Insufficient documentation

## 2016-04-05 DIAGNOSIS — G5712 Meralgia paresthetica, left lower limb: Secondary | ICD-10-CM

## 2016-04-05 NOTE — Progress Notes (Signed)
[facility]  PATIENT: [name] DOB: 1956/04/22  REFERRING DOCTOR OR PCP: Dr.  Shana Chute SOURCE: patient and records from [facility]  _________________________________   HISTORICAL  CHIEF COMPLAINT:  Chief Complaint  Patient presents with  . Multiple Sclerosis    Sts. he continues to tolerate Tecfidera well and denies missed doses.  Sts. he was hospitalized at [facility] in [place] November 8-16th. Today he c/o intermittent tremors both hands, and intermittent stutter.  He was started on Lithium for bipolar disorder, in November, and wonders if Lithium is causing trmors and stuttering/fim    HISTORY OF PRESENT ILLNESS:  [name] is a 60 year old man with relapsing remitting multiple sclerosis and bipolar disease.   Since his last visit, he notes tremor and some stuttering.   Tremor:  He notes a rapid fine tremor in his hands that is not too bothersome.  Of note, lithium has recently been added but he did not have tremor the last time.    Stutter:   He notes a stuttering speech since being started on Lithium.    He finds this frustrating and also notes tha the has trouble coming up with the right words.     MS:   He has been on Tecfidera since late 2013 with a gap earlier this year when he ran out.   Marland KitchenHe tolerates it well and has not had any definite exacerbation while on it.   The MRI of the brain 07/2015 shows T2/FLAIR hyperintense foci in a pattern and configuration consistent with multiple sclerosis. There are no new lesions and there are no changes compared to his previous MRI. The MRI of the cervical spine shows some degenerative changes but no  MS plaques  Gait/strength/sensation: His gait has a reduced stride and he notes reduced balance. He has had some functional components to his gait issues at times.    No recent fall.   He notes mild weakness in both legs associated with some spasticity. He denies weakness or  numbness in the arms..   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   Bladder:  He has more hesitancy.   He also has some urgency, frequency.   He has occasional constipation but had diarrhea 2 weeks ago. He has not had any urinary or fecal incontinence in the past few years.    Vision:   He feels stable.  He notes diplopia when he looks either left or right.   He denies any eye pain.  Fatigue/sleep: He reports fatigue but this is usually mild.    If tired and he rests for a while he feels better. He does not have to take naps. Amantadine helped in the past but stopped helping so he stopped..  .  Mood/cognition:    He has bipolar disease and has had multiple inpatient psychiatric admissions with various changes of medications over the years. He was on lithium in 2011 an dit was recently restarted.    He has some depression but feels he is fairly stable.    MS History:   He presented with double vision in 2000.  MRI at that time showed lesions in the brain consistent with multiple sclerosis. He also had a lumbar puncture and the CSF was consistent with MS.   He was started on Avonex.  He had another episode of diplopia followed by weakness in both legs 3 years later.    He was switched to Copaxone for a while. Unfortunate, he had an another severe exacerbation with his legs this. He was started on Tysabri once a month.   For several years, he stayed to Samoaysabri but stopped as it was difficult for him to get the infusions. In 2011, he switched to Gilenya. 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 He had one exacerbation with weakness in 2012. He then started Tecfidera in late 2013. He has generally tolerated it well. He did not have any significant flushing or GI side effects. He has not had any  definite exacerbations while on Tecfidera.    MRI's form 07/16/14 were reviewed (reports and images on PACS)  IMPRESSION: This is an abnormal MRI of the brain with and without contrast showing T2/flair hyperintense foci in the left pons and in the periventricular greater than juxtacortical and deep white matter of both hemispheres. These foci are in a pattern and configuration consistent with the clinical diagnosis of multiple sclerosis. There are no enhancing or diffusion restricted foci.   IMPRESSION: This is an abnormal MRI of the cervical spine with and without contrast showing mild multilevel degenerative changes in the mid cervical spine as detailed above. There is no nerve root compression but there could be dynamic impingement of Left C4, right C6 and C7 nerve roots due to degenerative changes at C3-C4, C5-C6 and C6-C7. There does not appear to be any plaques or other abnormalities within the spinal cord.  REVIEW OF SYSTEMS: Constitutional: No fevers, chills, sweats, or change in appetite.   Fatigue Eyes: Notes double vision.  No eye pain Ear, nose and throat: No hearing loss, ear pain, nasal congestion, sore throat Cardiovascular: No chest pain, palpitations Respiratory: No shortness of breath at rest or with exertion.   No wheezes.  Occ coughs (smokes) GastrointestinaI: No nausea, vomiting, diarrhea, abdominal pain, fecal incontinence Genitourinary: No dysuria, urinary retention.  Mild frequency.  No nocturia. Musculoskeletal: No neck pain, mild back pain Integumentary: No rash, pruritus, skin lesions Neurological: as above Psychiatric: Notes depression and anxiety.   He has bipolar disorder Endocrine: No palpitations, diaphoresis, change in appetite, change in weigh or increased thirst Hematologic/Lymphatic: No anemia, purpura, petechiae. Allergic/Immunologic: No itchy/runny eyes, nasal congestion, recent allergic reactions, rashes  ALLERGIES: No Known Allergies  HOME  MEDICATIONS:  Current Outpatient Prescriptions:  .  buPROPion (WELLBUTRIN XL) 300 MG 24 hr tablet, Take 1 tablet (300 mg total) by mouth daily. For depression, Disp: 30 tablet, Rfl: 0 .  Dimethyl Fumarate (TECFIDERA) 240 MG CPDR, Take 1 capsule (240 mg total) by mouth 2 (two) times daily., Disp: 180 capsule, Rfl: 3 .  escitalopram (LEXAPRO) 20 MG tablet, Take 1 tablet (20 mg total) by mouth daily. For depression, Disp: 30 tablet, Rfl: 0 .  lithium carbonate (ESKALITH) 450 MG CR tablet, Take 450 mg by mouth 2 (two) times daily., Disp: , Rfl: 0 .  traZODone (DESYREL) 100 MG tablet, take 1 tablet by mouth at bedtime for sleep, Disp: , Rfl: 0  PAST MEDICAL HISTORY: Past Medical History:  Diagnosis Date  . Depression   . Mental disorder   . Movement disorder   . MS (multiple sclerosis) (HCC)   . Multiple sclerosis, relapsing-remitting (HCC)   . Neuromuscular disorder (HCC)   . Vision abnormalities     PAST SURGICAL HISTORY: Past Surgical History:  Procedure Laterality Date  . TONSILLECTOMY      FAMILY HISTORY: Family History  Problem Relation Age of Onset  . Heart disease Mother   . Stroke Father     SOCIAL HISTORY:  Social History   Social History  . Marital status: Legally Separated    Spouse name: N/A  . Number of children: N/A  . Years of education: N/A   Occupational History  . Not on file.   Social History Main Topics  . Smoking status: Current Every Day Smoker    Packs/day: 0.50    Years: 36.00    Types: Cigarettes  . Smokeless tobacco: Never Used  . Alcohol use 0.0 oz/week     Comment: "hardly drink except when I get into a hotel room, probably every 6 months or so"  . Drug use:      Comment: Marijuana- 2-3 bowls per day  . Sexual activity: Not Currently    Birth control/ protection: None   Other Topics Concern  . Not on file   Social History Narrative   ** Merged History Encounter **         PHYSICAL EXAM  Vitals:   04/05/16 1454  BP:  116/78  Pulse: 94  Resp: 20  Weight: 183 lb (83 kg)  Height: 5\' 11"  (1.803 m)    Body mass index is 25.52 kg/m.   General: The patient is well-developed and well-nourished and in no acute distress  Musculoskeletal:  Back is nontender  Neurologic Exam  Mental status: The patient is alert and oriented x 3 at the time of the examination. The patient has apparent normal recent and remote memory, with an apparently normal attention span and concentration ability.   Speech is normal.  Cranial nerves: Extraocular movements appear full but he notes diplopia to far gaze to the right.      Facial symmetry is present. There is good facial sensation to soft touch bilaterally.Facial strength is normal.  Trapezius and sternocleidomastoid strength is normal. No dysarthria is noted.    No obvious hearing deficits are noted.  Motor:  Low amplitude high frequency rest tremor.  Muscle bulk is normal.   Tone is increased in legs. Strength is  5 / 5 in all 4 extremities except 4+/5 in hip flexors   Sensory: Sensory testing is intact to soft touch and vibration sensation in all 4 extremities Except for numbness over the left lateral femoral cutaneous nerve distribution.  Coordination: Cerebellar testing reveals good finger-nose-finger and poor heel-to-shin bilaterally.  Gait and station: Station is wide stanced. He sues hands to get out of a chair.   Gait is wide stanced with small stride.  There is some functional component and he does better with longer distance in the hallway.  Tandem gait is very wide. Romberg is negative.   Reflexes: Deep tendon reflexes are symmetric and 3+ bilaterally.    DTRs are increased with sread at the knees,     DIAGNOSTIC DATA (LABS, IMAGING, TESTING) - I reviewed patient records, labs, notes, testing and imaging myself where available.  Lab Results  Component Value Date   WBC 4.5 02/29/2016   HGB 15.2 02/29/2016   HCT 44.2 02/29/2016   MCV 94.6 02/29/2016   PLT  207 02/29/2016      Component Value Date/Time   NA 137 02/29/2016 0005   NA 146 (H) 07/15/2015 1134   K 4.0 02/29/2016 0005   CL 106 02/29/2016 0005   CO2 20 (L) 02/29/2016 0005  GLUCOSE 90 02/29/2016 0005   BUN 15 02/29/2016 0005   BUN 10 07/15/2015 1134   CREATININE 0.94 02/29/2016 0005   CALCIUM 8.8 (L) 02/29/2016 0005   PROT 7.4 02/29/2016 0005   PROT 6.6 07/15/2015 1134   ALBUMIN 4.2 02/29/2016 0005   ALBUMIN 4.0 07/15/2015 1134   AST 20 02/29/2016 0005   ALT 16 (L) 02/29/2016 0005   ALKPHOS 57 02/29/2016 0005   BILITOT 0.4 02/29/2016 0005   BILITOT 0.3 07/15/2015 1134   GFRNONAA >60 02/29/2016 0005   GFRAA >60 02/29/2016 0005    No results found for: [id number] Lab Results  Component Value Date   TSH 2.380 02/29/2016     ASSESSMENT AND PLAN  Multiple sclerosis (HCC) - Plan: CBC with Differential/Platelet  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  Continue Tecfidera.  Check a CBC with differential to make sure that he does not have any lymphopenia. 2.   He is advised to try to be more active and exercise more if possible. 3.  Due to the tremor, check lithium and TSH.   I'll also check CK as some weakness st9oanding up from chair  4.   He will return to see me in 6 months or sooner if he has new or worsening neurologic symptoms.   Javelle Donigan A. Epimenio Foot, MD, PhD 04/05/2016, 3:38 PM Certified in Neurology, Clinical Neurophysiology, Sleep Medicine, Pain Medicine and Neuroimaging  [facility] [address], [address] [place], [place] [address] [phone]

## 2016-04-06 ENCOUNTER — Telehealth: Payer: Self-pay | Admitting: *Deleted

## 2016-04-06 ENCOUNTER — Other Ambulatory Visit: Payer: Self-pay | Admitting: *Deleted

## 2016-04-06 DIAGNOSIS — R7989 Other specified abnormal findings of blood chemistry: Secondary | ICD-10-CM | POA: Insufficient documentation

## 2016-04-06 LAB — CBC WITH DIFFERENTIAL/PLATELET
Basophils Absolute: 0 10*3/uL (ref 0.0–0.2)
Basos: 0 %
EOS (ABSOLUTE): 0.1 10*3/uL (ref 0.0–0.4)
Eos: 2 %
Hematocrit: 44.2 % (ref 37.5–51.0)
Hemoglobin: 14.8 g/dL (ref 13.0–17.7)
Immature Grans (Abs): 0 10*3/uL (ref 0.0–0.1)
Immature Granulocytes: 0 %
Lymphocytes Absolute: 1.1 10*3/uL (ref 0.7–3.1)
Lymphs: 20 %
MCH: 31.5 pg (ref 26.6–33.0)
MCHC: 33.5 g/dL (ref 31.5–35.7)
MCV: 94 fL (ref 79–97)
Monocytes Absolute: 0.4 10*3/uL (ref 0.1–0.9)
Monocytes: 7 %
Neutrophils Absolute: 3.9 10*3/uL (ref 1.4–7.0)
Neutrophils: 71 %
Platelets: 255 10*3/uL (ref 150–379)
RBC: 4.7 x10E6/uL (ref 4.14–5.80)
RDW: 14.4 % (ref 12.3–15.4)
WBC: 5.4 10*3/uL (ref 3.4–10.8)

## 2016-04-06 LAB — CK: Total CK: 28 U/L (ref 24–204)

## 2016-04-06 LAB — LITHIUM LEVEL: Lithium Lvl: 0.8 mmol/L (ref 0.6–1.2)

## 2016-04-06 LAB — TSH: TSH: 7.01 u[IU]/mL — ABNORMAL HIGH (ref 0.450–4.500)

## 2016-04-06 NOTE — Telephone Encounter (Signed)
-----   Message from Asa Lente, MD sent at 04/06/2016 10:29 AM EST ----- TSH was elevated, please see if T4 can be added

## 2016-04-06 NOTE — Telephone Encounter (Signed)
I spoken wth SS in the lab--she was able to add T4.  [place] in [place], and once back, I will call Erick with all results/fim

## 2016-04-07 LAB — SPECIMEN STATUS REPORT

## 2016-04-09 NOTE — Telephone Encounter (Signed)
-----   Message from Asa Lente, MD sent at 04/09/2016  1:07 PM EST ----- Please let him know that the lithium level was good. We can forward this to his psychiatrist if he wants [place] to. The one thyroid test was borderline but other Channing Mutters test was okay.  We willt repeat this next time he has blood work done.

## 2016-04-09 NOTE — Telephone Encounter (Signed)
LMOM to let pt. know that per RAS, labs look ok--lithium level is ok, tsh was some high, but T4 was ok.  He does not need to return this call unless he has questions, or if he wants me to forward results to his psychiatrist/fim

## 2016-04-09 NOTE — Telephone Encounter (Signed)
I have spoken with with Alexander RuizJohn this afternoon and reviewed lab results with him--lithium level was ok.  TSH was some high, but T4 was normal.  He verbalized  understanding of same/fim

## 2016-04-09 NOTE — Telephone Encounter (Signed)
Pt called said he rec'd a call from the clinic, he thought it was RN calling about lab results

## 2016-04-10 LAB — T4: T4, Total: 7 ug/dL (ref 4.5–12.0)

## 2016-04-10 LAB — SPECIMEN STATUS REPORT

## 2016-04-24 ENCOUNTER — Encounter (HOSPITAL_COMMUNITY): Payer: Self-pay | Admitting: Family Medicine

## 2016-04-24 ENCOUNTER — Emergency Department (HOSPITAL_COMMUNITY)
Admission: EM | Admit: 2016-04-24 | Discharge: 2016-04-25 | Disposition: A | Discharge status: Home / Self Care | Payer: Medicare HMO | Attending: Emergency Medicine | Admitting: Emergency Medicine

## 2016-04-24 DIAGNOSIS — R45851 Suicidal ideations: Secondary | ICD-10-CM | POA: Diagnosis present

## 2016-04-24 DIAGNOSIS — F3131 Bipolar disorder, current episode depressed, mild: Secondary | ICD-10-CM | POA: Diagnosis not present

## 2016-04-24 DIAGNOSIS — F314 Bipolar disorder, current episode depressed, severe, without psychotic features: Secondary | ICD-10-CM | POA: Diagnosis not present

## 2016-04-24 DIAGNOSIS — Z79899 Other long term (current) drug therapy: Secondary | ICD-10-CM | POA: Insufficient documentation

## 2016-04-24 DIAGNOSIS — F17210 Nicotine dependence, cigarettes, uncomplicated: Secondary | ICD-10-CM | POA: Diagnosis not present

## 2016-04-24 DIAGNOSIS — Z823 Family history of stroke: Secondary | ICD-10-CM | POA: Diagnosis not present

## 2016-04-24 DIAGNOSIS — Z8249 Family history of ischemic heart disease and other diseases of the circulatory system: Secondary | ICD-10-CM | POA: Diagnosis not present

## 2016-04-24 LAB — COMPREHENSIVE METABOLIC PANEL
ALT: 15 U/L — ABNORMAL LOW (ref 17–63)
AST: 21 U/L (ref 15–41)
Albumin: 4.5 g/dL (ref 3.5–5.0)
Alkaline Phosphatase: 57 U/L (ref 38–126)
Anion gap: 9 (ref 5–15)
BUN: 19 mg/dL (ref 6–20)
CO2: 22 mmol/L (ref 22–32)
Calcium: 8.6 mg/dL — ABNORMAL LOW (ref 8.9–10.3)
Chloride: 105 mmol/L (ref 101–111)
Creatinine, Ser: 0.96 mg/dL (ref 0.61–1.24)
GFR calc Af Amer: >60 mL/min (ref >60)
GFR calc non Af Amer: >60 mL/min (ref >60)
Glucose, Bld: 85 mg/dL (ref 65–99)
Potassium: 3.7 mmol/L (ref 3.5–5.1)
Sodium: 136 mmol/L (ref 135–145)
Total Bilirubin: 0.7 mg/dL (ref 0.3–1.2)
Total Protein: 7.6 g/dL (ref 6.5–8.1)

## 2016-04-24 LAB — RAPID URINE DRUG SCREEN, HOSP PERFORMED
Amphetamines: NOT DETECTED
Barbiturates: NOT DETECTED
Benzodiazepines: NOT DETECTED
Cocaine: NOT DETECTED
Opiates: NOT DETECTED
Tetrahydrocannabinol: POSITIVE — AB

## 2016-04-24 LAB — CBC
HCT: 44.3 % (ref 39.0–52.0)
Hemoglobin: 14.9 g/dL (ref 13.0–17.0)
MCH: 32.7 pg (ref 26.0–34.0)
MCHC: 33.6 g/dL (ref 30.0–36.0)
MCV: 97.4 fL (ref 78.0–100.0)
Platelets: 214 10*3/uL (ref 150–400)
RBC: 4.55 MIL/uL (ref 4.22–5.81)
RDW: 15.4 % (ref 11.5–15.5)
WBC: 6.5 10*3/uL (ref 4.0–10.5)

## 2016-04-24 LAB — SALICYLATE LEVEL: Salicylate Lvl: <7 mg/dL (ref 2.8–30.0)

## 2016-04-24 LAB — ETHANOL: Alcohol, Ethyl (B): 67 mg/dL — ABNORMAL HIGH (ref <5)

## 2016-04-24 LAB — ACETAMINOPHEN LEVEL: Acetaminophen (Tylenol), Serum: <10 ug/mL — ABNORMAL LOW (ref 10–30)

## 2016-04-24 MED ORDER — ACETAMINOPHEN 325 MG PO TABS: 650.0000 mg | ORAL_TABLET | ORAL | Status: DC | PRN

## 2016-04-24 MED ORDER — DIMETHYL FUMARATE 240 MG PO CPDR: 240.0000 mg | DELAYED_RELEASE_CAPSULE | Freq: Two times a day (BID) | ORAL | Status: DC

## 2016-04-24 MED ORDER — LORAZEPAM 1 MG PO TABS: 1.0000 mg | ORAL_TABLET | Freq: Three times a day (TID) | ORAL | Status: DC | PRN

## 2016-04-24 MED ORDER — BUPROPION HCL ER (XL) 150 MG PO TB24
300.0000 mg | ORAL_TABLET | Freq: Every day | ORAL | Status: DC
  Administered 2016-04-25: 300 mg via ORAL
  Filled 2016-04-24: qty 2

## 2016-04-24 MED ORDER — ONDANSETRON HCL 4 MG PO TABS: 4.0000 mg | ORAL_TABLET | Freq: Three times a day (TID) | ORAL | Status: DC | PRN

## 2016-04-24 MED ORDER — LITHIUM CARBONATE ER 450 MG PO TBCR
450.0000 mg | EXTENDED_RELEASE_TABLET | Freq: Two times a day (BID) | ORAL | Status: DC
  Administered 2016-04-24 – 2016-04-25 (×2): 450 mg via ORAL
  Filled 2016-04-24 (×2): qty 1

## 2016-04-24 MED ORDER — ESCITALOPRAM OXALATE 10 MG PO TABS
20.0000 mg | ORAL_TABLET | Freq: Every day | ORAL | Status: DC
  Administered 2016-04-25: 20 mg via ORAL
  Filled 2016-04-24: qty 2
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
## 2016-04-24 NOTE — ED Notes (Signed)
TTS at bedside. 
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
## 2016-04-24 NOTE — ED Notes (Signed)
Pt has undressed and placed belongings in his assigned locker, #29. Pt has been wanded by security. Provided a [place] sandwich and ice water.

## 2016-04-25 ENCOUNTER — Encounter (HOSPITAL_COMMUNITY): Payer: Self-pay | Admitting: Family Medicine

## 2016-04-25 DIAGNOSIS — F17210 Nicotine dependence, cigarettes, uncomplicated: Secondary | ICD-10-CM | POA: Diagnosis not present

## 2016-04-25 DIAGNOSIS — Z79899 Other long term (current) drug therapy: Secondary | ICD-10-CM

## 2016-04-25 DIAGNOSIS — F314 Bipolar disorder, current episode depressed, severe, without psychotic features: Secondary | ICD-10-CM

## 2016-04-25 DIAGNOSIS — Z8249 Family history of ischemic heart disease and other diseases of the circulatory system: Secondary | ICD-10-CM | POA: Diagnosis not present

## 2016-04-25 DIAGNOSIS — Z823 Family history of stroke: Secondary | ICD-10-CM

## 2016-04-25 LAB — URINALYSIS, ROUTINE W REFLEX MICROSCOPIC
Bilirubin Urine: NEGATIVE
Glucose, UA: NEGATIVE mg/dL
Hgb urine dipstick: NEGATIVE
Ketones, ur: NEGATIVE mg/dL
Leukocytes, UA: NEGATIVE
Nitrite: NEGATIVE
Protein, ur: NEGATIVE mg/dL
Specific Gravity, Urine: 1.01 (ref 1.005–1.030)
pH: 7 (ref 5.0–8.0)

## 2016-04-25 LAB — LITHIUM LEVEL: Lithium Lvl: 0.63 mmol/L (ref 0.60–1.20)

## 2016-04-25 NOTE — Consult Note (Signed)
[facility] Consult   Reason for Consult:  Suicidal ideations with a plan Referring Physician:  EDP Patient Identification: [name] MRN:  [id number] Principal Diagnosis: Bipolar affective disorder, depressed, severe ([place]) Diagnosis:   Patient Active Problem List   Diagnosis Date Noted  . Alcohol abuse [F10.10] 07/16/2015    Priority: High  . Alcohol-induced mood disorder ([place]) [F10.94] 07/16/2015    Priority: High  . Bipolar affective disorder, depressed, severe ([place]) [F31.4] 09/18/2012    Priority: High    Class: Chronic  . Dysthymia [F34.1] 05/11/2011    Priority: High  . Elevated TSH [R94.6] 04/06/2016  . Bipolar I disorder ([place]) [F31.9] 04/05/2016  . 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 Numbness [R20.0] 01/16/2016  . Other fatigue [R53.83] 06/29/2014  . Gait disturbance [R26.9] 06/29/2014  . Urinary urgency [R39.15] 06/29/2014  . High risk medication use [Z79.899] 06/29/2014  . Multiple sclerosis ([place]) [G35] 10/31/2011    Class: Chronic    Total Time spent with patient: 45 minutes  Subjective:   [name] is a 61 y.o. male patient feels "better today."  HPI:  61 yo male who came to the ED with an increase in depression and suicidal ideations.  Today, he reports feeling better after sleep.  No suicidal/homicidal ideations, hallucinations, and alcohol/drug abuse.  He was offered a geriatric bed at [facility] but wanted to go home and continue his mental health treatment in outpatient.  He lives with a roommate and is stable for discharge.   Past Psychiatric History: depression, alcohol abuse, bipolar d/o  Risk to Self: None Risk to Others: Homicidal Ideation: No Thoughts of Harm to Others: No Current Homicidal Intent: No Current Homicidal Plan: No Access to Homicidal Means: No History of harm to others?: No Assessment of Violence: None Noted Does patient have access to weapons?: No Criminal Charges Pending?: No Does  patient have a court date: No Prior Inpatient Therapy: Prior Inpatient Therapy: Yes Prior Therapy Dates: Multiple  Prior Therapy Facilty/Provider(s): [facility], [place], [place] Reason for Treatment: Depression, SI Prior Outpatient Therapy: Prior Outpatient Therapy: No Does patient have an ACCT team?: No Does patient have Intensive In-House Services?  : No Does patient have [facility] services? : No Does patient have P4CC services?: No  Past Medical History:  Past Medical History:  Diagnosis Date  . Depression   . Mental disorder   . Movement disorder   . MS (multiple sclerosis) ([place])   . Multiple sclerosis, relapsing-remitting ([place])   . Neuromuscular disorder ([place])   . Vision abnormalities     Past Surgical History:  Procedure Laterality Date  . TONSILLECTOMY     Family History:  Family History  Problem Relation Age of Onset  . Heart disease Mother   . Stroke Father    Family Psychiatric  History: none Social History:  History  Alcohol Use  . 0.0 oz/week    Comment: 2-3 times a week.      History  Drug Use  . Types: Marijuana    Comment: 1-2 times a month    Social History   Social History  . Marital status: Legally Separated    Spouse name: N/A  . Number of children: N/A  . Years of education: N/A   Social History Main Topics  . Smoking status: Current Every Day Smoker    Packs/day: 3.00    Years: 36.00    Types: Cigarettes  . Smokeless tobacco: Never Used  . Alcohol use 0.0  oz/week     Comment: 2-3 times a week.   . Drug use:     Types: Marijuana     Comment: 1-2 times a month  . Sexual activity: Not Currently    Birth control/ protection: None   Other Topics Concern  . None   Social History Narrative   ** Merged History Encounter **       Additional Social History:    Allergies:  No Known Allergies  Labs:  Results for orders placed or performed during the hospital encounter of 04/24/16 (from the past 48 hour(s))  Ethanol     Status:  Abnormal   Collection Time: 04/24/16  9:57 PM  Result Value Ref Range   Alcohol, Ethyl (B) 67 (H) <5 mg/dL    Comment:        LOWEST DETECTABLE LIMIT FOR SERUM ALCOHOL IS 5 mg/dL FOR MEDICAL PURPOSES ONLY   Salicylate level     Status: None   Collection Time: 04/24/16  9:57 PM  Result Value Ref Range   Salicylate Lvl <3.3 2.8 - 30.0 mg/dL  Acetaminophen level     Status: Abnormal   Collection Time: 04/24/16  9:57 PM  Result Value Ref Range   Acetaminophen (Tylenol), Serum <10 (L) 10 - 30 ug/mL    Comment:        THERAPEUTIC CONCENTRATIONS VARY SIGNIFICANTLY. A RANGE OF 10-30 ug/mL MAY BE AN EFFECTIVE CONCENTRATION FOR MANY PATIENTS. HOWEVER, SOME ARE BEST TREATED AT CONCENTRATIONS OUTSIDE THIS RANGE. ACETAMINOPHEN CONCENTRATIONS >150 ug/mL AT 4 HOURS AFTER INGESTION AND >50 ug/mL AT 12 HOURS AFTER INGESTION ARE OFTEN ASSOCIATED WITH TOXIC REACTIONS.   Rapid urine drug screen (hospital performed)     Status: Abnormal   Collection Time: 04/24/16  9:57 PM  Result Value Ref Range   Opiates NONE DETECTED NONE DETECTED   Cocaine NONE DETECTED NONE DETECTED   Benzodiazepines NONE DETECTED NONE DETECTED   Amphetamines NONE DETECTED NONE DETECTED   Tetrahydrocannabinol POSITIVE (A) NONE DETECTED   Barbiturates NONE DETECTED NONE DETECTED    Comment:        DRUG SCREEN FOR MEDICAL PURPOSES ONLY.  IF CONFIRMATION IS NEEDED FOR ANY PURPOSE, NOTIFY LAB WITHIN 5 DAYS.        LOWEST DETECTABLE LIMITS FOR URINE DRUG SCREEN Drug Class       Cutoff (ng/mL) Amphetamine      1000 Barbiturate      200 Benzodiazepine   825 Tricyclics       053 Opiates          300 Cocaine          300 THC              50   Comprehensive metabolic panel     Status: Abnormal   Collection Time: 04/24/16 10:04 PM  Result Value Ref Range   Sodium 136 135 - 145 mmol/L   Potassium 3.7 3.5 - 5.1 mmol/L   Chloride 105 101 - 111 mmol/L   CO2 22 22 - 32 mmol/L   Glucose, Bld 85 65 - 99 mg/dL   BUN 19  6 - 20 mg/dL   Creatinine, Ser 0.96 0.61 - 1.24 mg/dL   Calcium 8.6 (L) 8.9 - 10.3 mg/dL   Total Protein 7.6 6.5 - 8.1 g/dL   Albumin 4.5 3.5 - 5.0 g/dL   AST 21 15 - 41 U/L   ALT 15 (L) 17 - 63 U/L   Alkaline Phosphatase 57 38 - 126  U/L   Total Bilirubin 0.7 0.3 - 1.2 mg/dL   GFR calc non Af Amer >60 >60 mL/min   GFR calc Af Amer >60 >60 mL/min    Comment: (NOTE) The eGFR has been calculated using the CKD EPI equation. This calculation has not been validated in all clinical situations. eGFR's persistently <60 mL/min signify possible Chronic Kidney Disease.    Anion gap 9 5 - 15  cbc     Status: None   Collection Time: 04/24/16 10:04 PM  Result Value Ref Range   WBC 6.5 4.0 - 10.5 K/uL   RBC 4.55 4.22 - 5.81 MIL/uL   Hemoglobin 14.9 13.0 - 17.0 g/dL   HCT 44.3 39.0 - 52.0 %   MCV 97.4 78.0 - 100.0 fL   MCH 32.7 26.0 - 34.0 pg   MCHC 33.6 30.0 - 36.0 g/dL   RDW 15.4 11.5 - 15.5 %   Platelets 214 150 - 400 K/uL  Lithium level     Status: None   Collection Time: 04/24/16 11:32 PM  Result Value Ref Range   Lithium Lvl 0.63 0.60 - 1.20 mmol/L    Current Facility-Administered Medications  Medication Dose Route Frequency Provider Last Rate Last Dose  . acetaminophen (TYLENOL) tablet 650 mg  650 mg Oral Q4H PRN Davonna Belling, MD      . buPROPion (WELLBUTRIN XL) 24 hr tablet 300 mg  300 mg Oral Daily Davonna Belling, MD   300 mg at 04/25/16 0932  . Dimethyl Fumarate CPDR 240 mg  240 mg Oral BID Davonna Belling, MD   Stopped at 04/25/16 0025  . escitalopram (LEXAPRO) tablet 20 mg  20 mg Oral Daily Davonna Belling, MD   20 mg at 04/25/16 0932  . lithium carbonate (ESKALITH) CR tablet 450 mg  450 mg Oral BID Davonna Belling, MD   450 mg at 04/25/16 0932  . ondansetron (ZOFRAN) tablet 4 mg  4 mg Oral Q8H PRN Davonna Belling, MD       Current Outpatient Prescriptions  Medication Sig Dispense Refill  . buPROPion (WELLBUTRIN XL) 300 MG 24 hr tablet Take 1 tablet (300 mg total)  by mouth daily. For depression 30 tablet 0  . Dimethyl Fumarate (TECFIDERA) 240 MG CPDR Take 1 capsule (240 mg total) by mouth 2 (two) times daily. 180 capsule 3  . escitalopram (LEXAPRO) 20 MG tablet Take 1 tablet (20 mg total) by mouth daily. For depression 30 tablet 0  . lithium carbonate (ESKALITH) 450 MG CR tablet Take 450 mg by mouth 2 (two) times daily.  0    Musculoskeletal: Strength & Muscle Tone: within normal limits Gait & Station: normal Patient leans: N/A  Psychiatric Specialty Exam: Physical Exam  Constitutional: He is oriented to person, place, and time. He appears well-developed and well-nourished.  HENT:  Head: Normocephalic.  Neck: Normal range of motion.  Respiratory: Effort normal.  Musculoskeletal: Normal range of motion.  Neurological: He is alert and oriented to person, place, and time.  Psychiatric: His speech is normal and behavior is normal. Judgment and thought content normal. Cognition and memory are normal. He exhibits a depressed mood.    Review of Systems  Psychiatric/Behavioral: Positive for depression.  All other systems reviewed and are negative.   Blood pressure 127/70, pulse 62, temperature 97.8 F (36.6 C), temperature source Oral, resp. rate 18, height '5\' 11"'  (1.803 m), weight 83.9 kg (185 lb), SpO2 94 %.Body mass index is 25.8 kg/m.  General Appearance: Casual  Eye Contact:  Good  Speech:  Normal Rate  Volume:  Normal  Mood:  Normal  Affect:  Congruent  Thought Process:  Coherent and Descriptions of Associations: Intact  Orientation:  Full (Time, Place, and Person)  Thought Content:  WEL  Suicidal Thoughts:  No   Homicidal Thoughts:  No  Memory:Immediate, good; Recent, good; remote, good  Judgement:  Fair  Insight:  Fair  Psychomotor Activity:  Normal  Concentration:  Concentration and attention span:  good  Recall:  Good  Fund of Knowledge:  Fair  Language:  Good  Akathisia:  No  Handed:  Right  AIMS (if indicated):      Assets:  Housing Leisure Time Resilience Social Support  ADL's:  Intact  Cognition:  WNL  Sleep:        Treatment Plan Summary: Bipolar affective disorder, depressed, mild: -Crisis stabilization -Medication management:  Continued medical medications along with Lexapro 20 mg daily for depression, Wellbutrin 300 mg daily for depression, and Lithium 450 mg BID for mood stabilization -Individual counseling  Disposition: Discharge home  Waylan Boga, NP 04/25/2016 12:45 PM  Patient seen face-to-face for psychiatric evaluation, chart reviewed and case discussed with the physician extender and developed treatment plan. Reviewed the information documented and agree with the treatment plan. Corena Pilgrim, MD

## 2016-04-25 NOTE — Discharge Instructions (Signed)
For your ongoing behavioral health needs, you are advised to follow up with one of the following treatment providers.  Call them at your earliest opportunity to ask about scheduling an intake appointment:       [facility]      [address]      [place], [place] [address]      [phone]      New patients are seen at their walk-in clinic.  Walk-in hours are Monday - Friday from 8:00 am - 12:00 pm, and from 1:00 pm - 3:00 pm.  Walk-in patients are seen on a first come, first served basis, so try to arrive as early as possible for the best chance of being seen the same day.  There is an initial fee of $22.50       [facility]      [address] [place], [place] [address]      [phone]

## 2016-04-25 NOTE — BH Assessment (Signed)
[facility] Assessment Progress Note  Per Thedore Mins, MD, this pt would benefit from psychiatric hospitalization at this time.  At 15:07 [name] calls from [facility] to report that pt has been accepted to their facility.  This Clinical research associate spoke to pt and he has declined this treatment options.  I staffed this with [name], DNP and she opines that pt does not meet IVC criteria.  He is to be discharged from [facility] with referral information for area outpatient treatment providers.  Discharge instructions advise pt to follow up with [facility], or [facility].  Pt's nurse has been notified.  [facility] has been contacted to rescind referral.  [name], MA Triage Specialist [phone]
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
## 2016-04-25 NOTE — ED Notes (Signed)
Spoke with pharmacy, Dimethyl Fumarate CPDR is not Risk manager. Will see if family or someone can get his medication from home tomorrow.
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
## 2016-07-16 ENCOUNTER — Ambulatory Visit: Payer: Medicare HMO | Admitting: Neurology

## 2016-08-30 ENCOUNTER — Telehealth: Payer: Self-pay | Admitting: Neurology

## 2016-08-30 NOTE — Telephone Encounter (Signed)
Pt's ex-wife called said he has not red'c a check from employer, he has not sent his disability paperwork to the clinic. She is not on DPR , advised her I could not speak with her. Said she would have pt call back

## 2016-08-30 NOTE — Telephone Encounter (Addendum)
Pt called back, updated his phone number. He could not remember what else he needed. York Spaniel he would call his ex-wife

## 2016-08-31 NOTE — Telephone Encounter (Signed)
Patient called office in reference to disability forms that ex wife dropped off yesterday.  Patient would like to know when they will be completed advised patient we will contact him once paperwork is completed, he needs done ASAP per patient.  Please call

## 2016-08-31 NOTE — Telephone Encounter (Signed)
I have spoken with [name] this morning.  He sts. his ex-wife dropped disability forms from [employer] off, that need to be completed asap.  I have not received forms yet, but will complete them once I do and let Johnn know once this has been done/fim

## 2016-09-03 NOTE — Telephone Encounter (Signed)
Patient calling to see if disability forms have been received and when they will be sent to [employer].

## 2016-09-03 NOTE — Telephone Encounter (Signed)
I have spoken with [name] this morning and advised I have not received disability paperwork yet--and have asked him to check with his ex-wife to see if she may have dropped it off at RAS ' old office--[facility] [employer].  He verbalized understanding of same/fim

## 2016-09-04 ENCOUNTER — Telehealth: Payer: Self-pay | Admitting: *Deleted

## 2016-09-04 NOTE — Telephone Encounter (Signed)
I have spoken with [name] this afternoon. I have finished his disability paperwork for [employer].  There is a portion of the paperwork for him to complete, but I have done this for him, with answers he has given me over the phone today. Paperwork faxed to [employer] fax# [phone], attn: Mary B. Janee Morn.  [employer] mailed to pt's  home address for his records.  Copy sent to med. rec. to be scanned into EPIC/fim

## 2016-09-05 NOTE — Telephone Encounter (Signed)
Disability paperwork completed, faxed to [employer].  [employer] mailed to pt's home address for his records.  Copy sent to be scanned into emr/fim

## 2016-09-23 ENCOUNTER — Emergency Department (HOSPITAL_COMMUNITY)
Admission: EM | Admit: 2016-09-23 | Discharge: 2016-09-24 | Disposition: A | Discharge status: Other Acute Inpatient Hospital | Payer: Medicare HMO | Attending: Emergency Medicine | Admitting: Emergency Medicine

## 2016-09-23 DIAGNOSIS — F314 Bipolar disorder, current episode depressed, severe, without psychotic features: Secondary | ICD-10-CM | POA: Diagnosis not present

## 2016-09-23 DIAGNOSIS — R45851 Suicidal ideations: Secondary | ICD-10-CM

## 2016-09-23 DIAGNOSIS — Z049 Encounter for examination and observation for unspecified reason: Secondary | ICD-10-CM

## 2016-09-23 DIAGNOSIS — G35 Multiple sclerosis: Secondary | ICD-10-CM | POA: Insufficient documentation

## 2016-09-23 DIAGNOSIS — Z79899 Other long term (current) drug therapy: Secondary | ICD-10-CM | POA: Insufficient documentation

## 2016-09-23 DIAGNOSIS — Z01818 Encounter for other preprocedural examination: Secondary | ICD-10-CM | POA: Diagnosis not present

## 2016-09-23 DIAGNOSIS — F17210 Nicotine dependence, cigarettes, uncomplicated: Secondary | ICD-10-CM | POA: Diagnosis not present

## 2016-09-23 DIAGNOSIS — R918 Other nonspecific abnormal finding of lung field: Secondary | ICD-10-CM | POA: Diagnosis not present

## 2016-09-23 DIAGNOSIS — F3130 Bipolar disorder, current episode depressed, mild or moderate severity, unspecified: Secondary | ICD-10-CM | POA: Diagnosis not present

## 2016-09-23 LAB — CBC WITH DIFFERENTIAL/PLATELET
Basophils Absolute: 0 10*3/uL (ref 0.0–0.1)
Basophils Relative: 1 %
Eosinophils Absolute: 0.1 10*3/uL (ref 0.0–0.7)
Eosinophils Relative: 3 %
HCT: 43.1 % (ref 39.0–52.0)
Hemoglobin: 14.5 g/dL (ref 13.0–17.0)
Lymphocytes Relative: 25 %
Lymphs Abs: 1.1 10*3/uL (ref 0.7–4.0)
MCH: 32.6 pg (ref 26.0–34.0)
MCHC: 33.6 g/dL (ref 30.0–36.0)
MCV: 96.9 fL (ref 78.0–100.0)
Monocytes Absolute: 0.4 10*3/uL (ref 0.1–1.0)
Monocytes Relative: 8 %
Neutro Abs: 2.7 10*3/uL (ref 1.7–7.7)
Neutrophils Relative %: 63 %
Platelets: 173 10*3/uL (ref 150–400)
RBC: 4.45 MIL/uL (ref 4.22–5.81)
RDW: 13.8 % (ref 11.5–15.5)
WBC: 4.3 10*3/uL (ref 4.0–10.5)

## 2016-09-23 LAB — RAPID URINE DRUG SCREEN, HOSP PERFORMED
Amphetamines: NOT DETECTED
Barbiturates: NOT DETECTED
Benzodiazepines: NOT DETECTED
Cocaine: NOT DETECTED
Opiates: NOT DETECTED
Tetrahydrocannabinol: NOT DETECTED

## 2016-09-23 NOTE — ED Notes (Signed)
Bed: WA31 Expected date:  Expected time:  Means of arrival:  Comments: 
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
## 2016-09-23 NOTE — ED Triage Notes (Signed)
Patient called help line today reporting that he felt like jumping off a bridge.  [employer] responded and brought patient in.  He is voluntary, calm and cooperative.  Patient reports he only takes his medications half the time.  Patient says he feels like a volcano getting ready to erupt.  Denies HI and AVH.

## 2016-09-23 NOTE — ED Notes (Signed)
Pt stating "I don't know how I'll get home tomorrow.  I can't walk far because of my MS.  The last time my ex-wife picked me up but she was mad.  My roommate could pick me up if I can get him to answer his phone.  He never turns it on.  What good is a phone if you don't turn it on."

## 2016-09-23 NOTE — ED Notes (Signed)
Pt stated "I take my medicine about half the time.  I smoke a pack a day.  I was thinking about jumping off a bridge.  There's a lot of them around where I live.  I see Dr. Epimenio Foot for my MS."  Pt ambulates with a cane.  Gilmer Mor has been labeled & is located behind nursing station.

## 2016-09-23 NOTE — ED Notes (Signed)
Bed: WA27 Expected date:  Expected time:  Means of arrival:  Comments: [employer]-[facility] 

## 2016-09-24 ENCOUNTER — Emergency Department (HOSPITAL_COMMUNITY): Payer: Medicare HMO

## 2016-09-24 DIAGNOSIS — F411 Generalized anxiety disorder: Secondary | ICD-10-CM | POA: Diagnosis not present

## 2016-09-24 DIAGNOSIS — F4312 Post-traumatic stress disorder, chronic: Secondary | ICD-10-CM | POA: Diagnosis not present

## 2016-09-24 DIAGNOSIS — Z79899 Other long term (current) drug therapy: Secondary | ICD-10-CM | POA: Diagnosis not present

## 2016-09-24 DIAGNOSIS — F329 Major depressive disorder, single episode, unspecified: Secondary | ICD-10-CM | POA: Diagnosis not present

## 2016-09-24 DIAGNOSIS — F314 Bipolar disorder, current episode depressed, severe, without psychotic features: Secondary | ICD-10-CM | POA: Diagnosis not present

## 2016-09-24 DIAGNOSIS — R918 Other nonspecific abnormal finding of lung field: Secondary | ICD-10-CM | POA: Diagnosis not present

## 2016-09-24 DIAGNOSIS — F17210 Nicotine dependence, cigarettes, uncomplicated: Secondary | ICD-10-CM | POA: Diagnosis not present

## 2016-09-24 DIAGNOSIS — F1010 Alcohol abuse, uncomplicated: Secondary | ICD-10-CM | POA: Diagnosis not present

## 2016-09-24 DIAGNOSIS — F1290 Cannabis use, unspecified, uncomplicated: Secondary | ICD-10-CM | POA: Diagnosis not present

## 2016-09-24 DIAGNOSIS — F17209 Nicotine dependence, unspecified, with unspecified nicotine-induced disorders: Secondary | ICD-10-CM | POA: Diagnosis not present

## 2016-09-24 DIAGNOSIS — Z01818 Encounter for other preprocedural examination: Secondary | ICD-10-CM | POA: Diagnosis not present

## 2016-09-24 DIAGNOSIS — R45851 Suicidal ideations: Secondary | ICD-10-CM | POA: Diagnosis not present

## 2016-09-24 DIAGNOSIS — F1490 Cocaine use, unspecified, uncomplicated: Secondary | ICD-10-CM | POA: Diagnosis not present

## 2016-09-24 DIAGNOSIS — F332 Major depressive disorder, recurrent severe without psychotic features: Secondary | ICD-10-CM | POA: Diagnosis not present

## 2016-09-24 DIAGNOSIS — G35 Multiple sclerosis: Secondary | ICD-10-CM | POA: Diagnosis not present

## 2016-09-24 LAB — COMPREHENSIVE METABOLIC PANEL
ALT: 14 U/L — ABNORMAL LOW (ref 17–63)
AST: 19 U/L (ref 15–41)
Albumin: 3.6 g/dL (ref 3.5–5.0)
Alkaline Phosphatase: 54 U/L (ref 38–126)
Anion gap: 6 (ref 5–15)
BUN: 13 mg/dL (ref 6–20)
CO2: 25 mmol/L (ref 22–32)
Calcium: 8.8 mg/dL — ABNORMAL LOW (ref 8.9–10.3)
Chloride: 108 mmol/L (ref 101–111)
Creatinine, Ser: 1.04 mg/dL (ref 0.61–1.24)
GFR calc Af Amer: >60 mL/min (ref >60)
GFR calc non Af Amer: >60 mL/min (ref >60)
Glucose, Bld: 104 mg/dL — ABNORMAL HIGH (ref 65–99)
Potassium: 4.5 mmol/L (ref 3.5–5.1)
Sodium: 139 mmol/L (ref 135–145)
Total Bilirubin: 0.5 mg/dL (ref 0.3–1.2)
Total Protein: 6.5 g/dL (ref 6.5–8.1)

## 2016-09-24 LAB — URINALYSIS, ROUTINE W REFLEX MICROSCOPIC
Bilirubin Urine: NEGATIVE
Glucose, UA: NEGATIVE mg/dL
Hgb urine dipstick: NEGATIVE
Ketones, ur: NEGATIVE mg/dL
Leukocytes, UA: NEGATIVE
Nitrite: NEGATIVE
Protein, ur: NEGATIVE mg/dL
Specific Gravity, Urine: 1.009 (ref 1.005–1.030)
pH: 7 (ref 5.0–8.0)

## 2016-09-24 LAB — LITHIUM LEVEL: Lithium Lvl: 0.58 mmol/L — ABNORMAL LOW (ref 0.60–1.20)

## 2016-09-24 LAB — ETHANOL: Alcohol, Ethyl (B): <5 mg/dL (ref <5)

## 2016-09-24 MED ORDER — BUPROPION HCL ER (XL) 150 MG PO TB24
300.0000 mg | ORAL_TABLET | Freq: Every day | ORAL | Status: DC
  Administered 2016-09-24: 300 mg via ORAL
  Filled 2016-09-24: qty 2

## 2016-09-24 MED ORDER — ESCITALOPRAM OXALATE 10 MG PO TABS
20.0000 mg | ORAL_TABLET | Freq: Every day | ORAL | Status: DC
  Administered 2016-09-24: 20 mg via ORAL
  Filled 2016-09-24: qty 2

## 2016-09-24 MED ORDER — IBUPROFEN 200 MG PO TABS: 600.0000 mg | ORAL_TABLET | Freq: Three times a day (TID) | ORAL | Status: DC | PRN

## 2016-09-24 MED ORDER — ONDANSETRON HCL 4 MG PO TABS: 4.0000 mg | ORAL_TABLET | Freq: Three times a day (TID) | ORAL | Status: DC | PRN

## 2016-09-24 MED ORDER — ACETAMINOPHEN 325 MG PO TABS: 650.0000 mg | ORAL_TABLET | ORAL | Status: DC | PRN

## 2016-09-24 MED ORDER — NICOTINE 21 MG/24HR TD PT24
21.0000 mg | MEDICATED_PATCH | Freq: Every day | TRANSDERMAL | Status: DC
  Filled 2016-09-24: qty 1

## 2016-09-24 MED ORDER — DIMETHYL FUMARATE 240 MG PO CPDR: 240.0000 mg | DELAYED_RELEASE_CAPSULE | Freq: Two times a day (BID) | ORAL | Status: DC

## 2016-09-24 MED ORDER — LITHIUM CARBONATE ER 450 MG PO TBCR
450.0000 mg | EXTENDED_RELEASE_TABLET | Freq: Two times a day (BID) | ORAL | Status: DC
Start: 2016-09-24 — End: 2016-09-24
  Administered 2016-09-24 (×2): 450 mg via ORAL
  Filled 2016-09-24 (×2): qty 1

## 2016-09-24 NOTE — BH Assessment (Addendum)
[facility] Assessment Progress Note  Per Thedore Mins, MD, this pt requires psychiatric hospitalization at this time.  At 13:15 [name] calls from [facility].  Pt has been accepted to their facility by Dr Wendall Stade to [name].  [name], DNP, concurs with this disposition, as does the pt who is currently under voluntary status.  Pt's nurse has been notified, and agrees to call report to [phone].  Pt is to be transported via [facility]; they are to take him to the [employer].  [name], MA Triage Specialist [phone]  Addendum:  [name] calls back, reporting that they cannot accept pt to their facility if he does not have his multiple sclerosis medications with him; he currently does not have them.  At 14:23 [name] calls from [facility].  Pt has been accepted to their facility by Dr Chilton Si.  [name], DNP and the pt both agree to this change of plan.  Please call report to [phone]; ask for the nurse that has the [facility].  Pt is to be transported via Leota Sauers, MA Triage Specialist [phone]
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
## 2016-09-24 NOTE — ED Provider Notes (Addendum)
Patient left at change of shift to get results of his laboratory testing and ordered TTS consult.  Results for orders placed or performed during the hospital encounter of 09/23/16  Comprehensive metabolic panel  Result Value Ref Range   Sodium 139 135 - 145 mmol/L   Potassium 4.5 3.5 - 5.1 mmol/L   Chloride 108 101 - 111 mmol/L   CO2 25 22 - 32 mmol/L   Glucose, Bld 104 (H) 65 - 99 mg/dL   BUN 13 6 - 20 mg/dL   Creatinine, Ser 0.62 0.61 - 1.24 mg/dL   Calcium 8.8 (L) 8.9 - 10.3 mg/dL   Total Protein 6.5 6.5 - 8.1 g/dL   Albumin 3.6 3.5 - 5.0 g/dL   AST 19 15 - 41 U/L   ALT 14 (L) 17 - 63 U/L   Alkaline Phosphatase 54 38 - 126 U/L   Total Bilirubin 0.5 0.3 - 1.2 mg/dL   GFR calc non Af Amer >60 >60 mL/min   GFR calc Af Amer >60 >60 mL/min   Anion gap 6 5 - 15  Ethanol  Result Value Ref Range   Alcohol, Ethyl (B) <5 <5 mg/dL  CBC with Differential  Result Value Ref Range   WBC 4.3 4.0 - 10.5 K/uL   RBC 4.45 4.22 - 5.81 MIL/uL   Hemoglobin 14.5 13.0 - 17.0 g/dL   HCT 37.6 28.3 - 15.1 %   MCV 96.9 78.0 - 100.0 fL   MCH 32.6 26.0 - 34.0 pg   MCHC 33.6 30.0 - 36.0 g/dL   RDW 76.1 60.7 - 37.1 %   Platelets 173 150 - 400 K/uL   Neutrophils Relative % 63 %   Neutro Abs 2.7 1.7 - 7.7 K/uL   Lymphocytes Relative 25 %   Lymphs Abs 1.1 0.7 - 4.0 K/uL   Monocytes Relative 8 %   Monocytes Absolute 0.4 0.1 - 1.0 K/uL   Eosinophils Relative 3 %   Eosinophils Absolute 0.1 0.0 - 0.7 K/uL   Basophils Relative 1 %   Basophils Absolute 0.0 0.0 - 0.1 K/uL  Urine rapid drug screen (hosp performed)  Result Value Ref Range   Opiates NONE DETECTED NONE DETECTED   Cocaine NONE DETECTED NONE DETECTED   Benzodiazepines NONE DETECTED NONE DETECTED   Amphetamines NONE DETECTED NONE DETECTED   Tetrahydrocannabinol NONE DETECTED NONE DETECTED   Barbiturates NONE DETECTED NONE DETECTED  Lithium level  Result Value Ref Range   Lithium Lvl 0.58 (L) 0.60 - 1.20 mmol/L   Laboratory interpretation  all normal except Subtherapeutic lithium level  Psych holding orders and home medications were ordered. TTS consult was ordered.  02:30 AM TTS recommends psychiatrist to evaluate in AM   Devoria Albe, MD, Iline Oven, Jodelle Gross, MD 09/24/16 Royetta Crochet    Devoria Albe, MD 09/24/16 [phone]

## 2016-09-25 ENCOUNTER — Telehealth: Payer: Self-pay | Admitting: Neurology

## 2016-09-25 LAB — URINE CULTURE: Culture: NO GROWTH

## 2016-09-25 NOTE — Telephone Encounter (Signed)
[facility] [phone]  (f) [phone] request refill forDimethyl Fumarate (TECFIDERA) 240 MG CPDR

## 2016-09-25 NOTE — Telephone Encounter (Signed)
Tecfidera rx. called to Maribel at [facility]/fim

## 2016-09-27 ENCOUNTER — Ambulatory Visit: Payer: Medicare HMO | Admitting: Neurology

## 2016-09-28 ENCOUNTER — Encounter: Payer: Self-pay | Admitting: Neurology

## 2016-10-04 ENCOUNTER — Encounter: Payer: Self-pay | Admitting: Neurology

## 2016-10-04 ENCOUNTER — Ambulatory Visit (INDEPENDENT_AMBULATORY_CARE_PROVIDER_SITE_OTHER): Payer: Medicare HMO | Admitting: Neurology

## 2016-10-04 VITALS — BP 109/69 | HR 79 | Resp 20 | Ht 71.0 in | Wt 179.0 lb

## 2016-10-04 DIAGNOSIS — H532 Diplopia: Secondary | ICD-10-CM | POA: Diagnosis not present

## 2016-10-04 DIAGNOSIS — F314 Bipolar disorder, current episode depressed, severe, without psychotic features: Secondary | ICD-10-CM

## 2016-10-04 DIAGNOSIS — G35 Multiple sclerosis: Secondary | ICD-10-CM

## 2016-10-04 DIAGNOSIS — R269 Unspecified abnormalities of gait and mobility: Secondary | ICD-10-CM | POA: Diagnosis not present

## 2016-10-04 DIAGNOSIS — H2513 Age-related nuclear cataract, bilateral: Secondary | ICD-10-CM | POA: Diagnosis not present

## 2016-10-04 DIAGNOSIS — H5021 Vertical strabismus, right eye: Secondary | ICD-10-CM | POA: Diagnosis not present

## 2016-10-04 DIAGNOSIS — R5383 Other fatigue: Secondary | ICD-10-CM

## 2016-10-04 DIAGNOSIS — Z79899 Other long term (current) drug therapy: Secondary | ICD-10-CM

## 2016-10-04 NOTE — Progress Notes (Signed)
[facility]  PATIENT: [name] DOB: 06-Nov-1955  REFERRING DOCTOR OR PCP: Dr.  Shana Chute SOURCE: patient and records from [facility]  _________________________________   HISTORICAL  CHIEF COMPLAINT:  Chief Complaint  Patient presents with  . Multiple Sclerosis    Sts. he continues to tolerate Tecfidera well.  Still hsa tremor in hands; thinks it is about hte same.  Recent hospital visit for increased depression--Abilify was added/fim    HISTORY OF PRESENT ILLNESS:  [name] is a 61 year old man with relapsing remitting multiple sclerosis and bipolar disease.     He recently had a Psych admission at [facility] for depression ans suicidal ideation.    Abilify was added (has only been on a few days).  MS:   He has been on Tecfidera since late 2013 with a gap earlier this year when he ran out.   Marland KitchenHe tolerates it well and has not had any definite exacerbation while on it. A recent lymphocyte count was good at 1.1 (09/23/16).    The MRI of the brain 07/2015 was stale.   It shows T2/FLAIR hyperintense foci in a pattern and configuration consistent with multiple sclerosis.   The MRI of the cervical spine shows some degenerative changes but no  MS plaques  Tremor:  He notes a rapid fine tremor in his hands that is not too bothersome.  Of note, lithium has recently been added but he did not have tremor the last time.    Tinnitus:   He notes a ringing tinnitus bilateral that has been present a while but seems worse.   Stutter:   He feels his stuttering speech is worse and "I can't half talk" .    notes a stuttering speech since being started on Lithium.    He finds this frustrating and also notes tha the has trouble coming up with the right words.     Gait/strength/sensation: He feels gait is worse.  He stumbles more.   He relies on his cane.   Balance is poor. No recent fall.   He notes mild weakness in both legs associated with some spasticity. He denies  weakness or numbness in the arms..   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   Bladder:  He notes mild hesitancy but this is stable.   He also has some urgency, frequency.   No incontinence or UTI.  e has occasional constipation but had diarrhea 2 weeks ago. He has not had any urinary or fecal incontinence in the past few years.    Vision:   He denies visual blurring.[name]  He notes diplopia when he looks either left or right.   He denies any eye pain.  Fatigue/sleep: He reports mild fatigue, about the same as usual for him.   He does not have to take naps. Amantadine helped in the past but stopped helping so he stopped.[name] He sleeps well most nights getting 7 hours or so.     Mood/cognition:    He has bipolar disease and has had multiple inpatient psychiatric admissions with various changes of medications over the years. He is on lithium and Abilify.    He has some depression but feels he is fairly stable.    MS History:   He presented with double vision in 2000.  MRI at that time showed lesions  in the brain consistent with multiple sclerosis. He also had a lumbar puncture and the CSF was consistent with MS.   He was started on Avonex.  He had another episode of diplopia followed by weakness in both legs 3 years later.    He was switched to Copaxone for a while. Unfortunate, he had an another severe exacerbation with his legs this. He was started on Tysabri once a month.   For several years, he stayed to [place] but stopped as it was difficult for him to get the infusions. In 2011, he switched to [place]. 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 He had one exacerbation with weakness in 2012. He then started Tecfidera in late 2013. He has generally tolerated it well. He did not have any significant  flushing or GI side effects. He has not had any definite exacerbations while on Tecfidera.      REVIEW OF SYSTEMS: Constitutional: No fevers, chills, sweats, or change in appetite.   Fatigue Eyes: Notes double vision.  No eye pain Ear, nose and throat: No hearing loss, ear pain, nasal congestion, sore throat Cardiovascular: No chest pain, palpitations Respiratory: No shortness of breath at rest or with exertion.   No wheezes.  Occ coughs (smokes) GastrointestinaI: No nausea, vomiting, diarrhea, abdominal pain, fecal incontinence Genitourinary: No dysuria, urinary retention.  Mild frequency.  No nocturia. Musculoskeletal: No neck pain, mild back pain Integumentary: No rash, pruritus, skin lesions Neurological: as above Psychiatric: Notes depression and anxiety.   He has bipolar disorder Endocrine: No palpitations, diaphoresis, change in appetite, change in weigh or increased thirst Hematologic/Lymphatic: No anemia, purpura, petechiae. Allergic/Immunologic: No itchy/runny eyes, nasal congestion, recent allergic reactions, rashes  ALLERGIES: No Known Allergies  HOME MEDICATIONS:  Current Outpatient Prescriptions:  .  ARIPiprazole (ABILIFY) 5 MG tablet, Take by mouth., Disp: , Rfl:  .  buPROPion (WELLBUTRIN XL) 300 MG 24 hr tablet, Take 1 tablet (300 mg total) by mouth daily. For depression, Disp: 30 tablet, Rfl: 0 .  Dimethyl Fumarate (TECFIDERA) 240 MG CPDR, Take 1 capsule (240 mg total) by mouth 2 (two) times daily., Disp: 180 capsule, Rfl: 3 .  escitalopram (LEXAPRO) 20 MG tablet, Take 1 tablet (20 mg total) by mouth daily. For depression, Disp: 30 tablet, Rfl: 0 .  lithium carbonate (ESKALITH) 450 MG CR tablet, Take 450 mg by mouth 2 (two) times daily., Disp: , Rfl: 0  PAST MEDICAL HISTORY: Past Medical History:  Diagnosis Date  . Depression   . Mental disorder   . Movement disorder   . MS (multiple sclerosis) (HCC)   . Multiple sclerosis, relapsing-remitting (HCC)   .  Neuromuscular disorder (HCC)   . Vision abnormalities     PAST SURGICAL HISTORY: Past Surgical History:  Procedure Laterality Date  . TONSILLECTOMY      FAMILY HISTORY: Family History  Problem Relation Age of Onset  . Heart disease Mother   . Stroke Father     SOCIAL HISTORY:  Social History   Social History  . Marital status: Legally Separated    Spouse name: N/A  . Number of children: N/A  . Years of education: N/A   Occupational History  . Not on file.   Social History Main Topics  . Smoking status: Current Every Day Smoker    Packs/day: 3.00    Years: 36.00    Types: Cigarettes  . Smokeless tobacco: Never Used  . Alcohol use 0.0 oz/week  Comment: 2-3 times a week.   . Drug use: Yes    Types: Marijuana     Comment: 1-2 times a month  . Sexual activity: Not Currently    Birth control/ protection: None   Other Topics Concern  . Not on file   Social History Narrative   ** Merged History Encounter **         PHYSICAL EXAM  Vitals:   10/04/16 0818  BP: 109/69  Pulse: 79  Resp: 20  Weight: 179 lb (81.2 kg)  Height: 5\' 11"  (1.803 m)    Body mass index is 24.97 kg/m.   General: The patient is well-developed and well-nourished and in no acute distress  Musculoskeletal:  Back is nontender  Neurologic Exam  Mental status: The patient is alert and oriented x 3 at the time of the examination.  Affect is flat which is typical for him. The patient has apparent normal recent and remote memory, with an apparently normal attention span and concentration ability.   Speech is normal a ttimes and stuttering other times  Cranial nerves: Extraocular muscles appear to be intact. However, he did report some diplopia when he looked to the right. Facial strength and sensation were normal..       Trapezius and sternocleidomastoid strength is normal. No dysarthria is noted.    No obvious hearing deficits are noted.  Motor:  He has a low amplitude high frequency  rest tremor.  Muscle bulk is normal.   Tone is increased in legs. Strength is  5 / 5 in all 4 extremities except 4+/5 in hip flexors   Sensory: Sensory testing is intact to soft touch and vibration sensation in all 4 extremities Except for numbness over the left lateral femoral cutaneous nerve distribution.  Coordination: Cerebellar testing reveals good finger-nose-finger and poor heel-to-shin bilaterally.  Gait and station: Station is wide stanced. He uses hands to get out of the chair and made a big show of effort. In the examination room, he took shuffling steps. In the hallway his stride was just mildly reduced.   Romberg is negative.   Reflexes: Deep tendon reflexes are symmetric and 3+ bilaterally.    DTRs are increased with sread at the knees,     DIAGNOSTIC DATA (LABS, IMAGING, TESTING) - I reviewed patient records, labs, notes, testing and imaging myself where available.  Lab Results  Component Value Date   WBC 4.3 09/23/2016   HGB 14.5 09/23/2016   HCT 43.1 09/23/2016   MCV 96.9 09/23/2016   PLT 173 09/23/2016      Component Value Date/Time   NA 139 09/23/2016 1944   NA 146 (H) 07/15/2015 1134   K 4.5 09/23/2016 1944   CL 108 09/23/2016 1944   CO2 25 09/23/2016 1944   GLUCOSE 104 (H) 09/23/2016 1944   BUN 13 09/23/2016 1944   BUN 10 07/15/2015 1134   CREATININE 1.04 09/23/2016 1944   CALCIUM 8.8 (L) 09/23/2016 1944   PROT 6.5 09/23/2016 1944   PROT 6.6 07/15/2015 1134   ALBUMIN 3.6 09/23/2016 1944   ALBUMIN 4.0 07/15/2015 1134   AST 19 09/23/2016 1944   ALT 14 (L) 09/23/2016 1944   ALKPHOS 54 09/23/2016 1944   BILITOT 0.5 09/23/2016 1944   BILITOT 0.3 07/15/2015 1134   GFRNONAA >60 09/23/2016 1944   GFRAA >60 09/23/2016 1944    No results found for: [id number] Lab Results  Component Value Date   TSH 7.010 (H) 04/05/2016     ASSESSMENT  AND PLAN  Multiple sclerosis (HCC)  Bipolar affective disorder, depressed, severe (HCC)  Gait disturbance  High  risk medication use  Other fatigue   1.  His recent CBC with differential is fine. He will continue the Tecfidera.   2.  I advised him to try to stay active and exercises if tolerated. I also advised him to try to quit smoking but he does not think he would be able to. 3.   He will follow-up with psychiatry/Behavioral Health. 4.   He will return to see me in 6 months or sooner if he has new or worsening neurologic symptoms.   Richard A. Epimenio Foot, MD, PhD 10/04/2016, 9:02 AM Certified in Neurology, Clinical Neurophysiology, Sleep Medicine, Pain Medicine and Neuroimaging  [facility] [address] [place], [place] [address] [phone]

## 2016-10-09 DIAGNOSIS — E785 Hyperlipidemia, unspecified: Secondary | ICD-10-CM | POA: Diagnosis not present

## 2016-10-09 DIAGNOSIS — F1210 Cannabis abuse, uncomplicated: Secondary | ICD-10-CM | POA: Diagnosis not present

## 2016-10-09 DIAGNOSIS — F1010 Alcohol abuse, uncomplicated: Secondary | ICD-10-CM | POA: Diagnosis not present

## 2016-10-09 DIAGNOSIS — F341 Dysthymic disorder: Secondary | ICD-10-CM | POA: Diagnosis not present

## 2016-10-09 DIAGNOSIS — G35 Multiple sclerosis: Secondary | ICD-10-CM | POA: Diagnosis not present

## 2016-10-09 DIAGNOSIS — F17290 Nicotine dependence, other tobacco product, uncomplicated: Secondary | ICD-10-CM | POA: Diagnosis not present

## 2016-11-19 DIAGNOSIS — R6889 Other general symptoms and signs: Secondary | ICD-10-CM | POA: Diagnosis not present

## 2016-11-28 DIAGNOSIS — R6889 Other general symptoms and signs: Secondary | ICD-10-CM | POA: Diagnosis not present

## 2016-11-29 DIAGNOSIS — H532 Diplopia: Secondary | ICD-10-CM | POA: Diagnosis not present

## 2016-11-29 DIAGNOSIS — Z9181 History of falling: Secondary | ICD-10-CM | POA: Diagnosis not present

## 2016-11-29 DIAGNOSIS — F314 Bipolar disorder, current episode depressed, severe, without psychotic features: Secondary | ICD-10-CM | POA: Diagnosis not present

## 2016-11-29 DIAGNOSIS — F17210 Nicotine dependence, cigarettes, uncomplicated: Secondary | ICD-10-CM | POA: Diagnosis not present

## 2016-11-29 DIAGNOSIS — G35 Multiple sclerosis: Secondary | ICD-10-CM | POA: Diagnosis not present

## 2016-11-30 ENCOUNTER — Telehealth: Payer: Self-pay | Admitting: Neurology

## 2016-11-30 DIAGNOSIS — R6889 Other general symptoms and signs: Secondary | ICD-10-CM | POA: Diagnosis not present

## 2016-11-30 NOTE — Telephone Encounter (Signed)
Nicolette/[facility] [phone] called the clinic needs VO for  PT -2 x 4 for balance, endurance and gait training.

## 2016-12-03 NOTE — Telephone Encounter (Signed)
I have spoken with [name] at [facility].  I don't see that RAS ordered PT, but he will sign PT orders, as his gait/balance issues are related to his MS. V/O given for PT twice per wk. for 4 wks/fim

## 2016-12-05 DIAGNOSIS — F17210 Nicotine dependence, cigarettes, uncomplicated: Secondary | ICD-10-CM | POA: Diagnosis not present

## 2016-12-05 DIAGNOSIS — F314 Bipolar disorder, current episode depressed, severe, without psychotic features: Secondary | ICD-10-CM | POA: Diagnosis not present

## 2016-12-05 DIAGNOSIS — H532 Diplopia: Secondary | ICD-10-CM | POA: Diagnosis not present

## 2016-12-05 DIAGNOSIS — Z9181 History of falling: Secondary | ICD-10-CM | POA: Diagnosis not present

## 2016-12-05 DIAGNOSIS — G35 Multiple sclerosis: Secondary | ICD-10-CM | POA: Diagnosis not present

## 2016-12-11 DIAGNOSIS — F17210 Nicotine dependence, cigarettes, uncomplicated: Secondary | ICD-10-CM | POA: Diagnosis not present

## 2016-12-11 DIAGNOSIS — F314 Bipolar disorder, current episode depressed, severe, without psychotic features: Secondary | ICD-10-CM | POA: Diagnosis not present

## 2016-12-11 DIAGNOSIS — G35 Multiple sclerosis: Secondary | ICD-10-CM | POA: Diagnosis not present

## 2016-12-11 DIAGNOSIS — Z9181 History of falling: Secondary | ICD-10-CM | POA: Diagnosis not present

## 2016-12-11 DIAGNOSIS — H532 Diplopia: Secondary | ICD-10-CM | POA: Diagnosis not present

## 2016-12-11 NOTE — Telephone Encounter (Signed)
[name] with [facility] requesting verbal order for speech therapy. She also says the patient for the past 3-6 months has been experiencing onset increased confusion, stuttering, vision changes and increased fatigue. She would like to discuss Tecfidera because she says these are side effects.

## 2016-12-12 NOTE — Telephone Encounter (Signed)
I have spoken with Joni Reining at [facility] and given v/o for speech therapy.  Sx. she is describing are per his norm.  She verbalized understanding of same/fim

## 2016-12-12 NOTE — Telephone Encounter (Signed)
LMTC./fim 

## 2016-12-12 NOTE — Telephone Encounter (Signed)
[name] with [facility] called office returning RN's call.  Please call.

## 2016-12-13 DIAGNOSIS — H532 Diplopia: Secondary | ICD-10-CM | POA: Diagnosis not present

## 2016-12-13 DIAGNOSIS — G35 Multiple sclerosis: Secondary | ICD-10-CM | POA: Diagnosis not present

## 2016-12-13 DIAGNOSIS — Z9181 History of falling: Secondary | ICD-10-CM | POA: Diagnosis not present

## 2016-12-13 DIAGNOSIS — F17210 Nicotine dependence, cigarettes, uncomplicated: Secondary | ICD-10-CM | POA: Diagnosis not present

## 2016-12-13 DIAGNOSIS — F314 Bipolar disorder, current episode depressed, severe, without psychotic features: Secondary | ICD-10-CM | POA: Diagnosis not present

## 2016-12-18 DIAGNOSIS — F17210 Nicotine dependence, cigarettes, uncomplicated: Secondary | ICD-10-CM | POA: Diagnosis not present

## 2016-12-18 DIAGNOSIS — H532 Diplopia: Secondary | ICD-10-CM | POA: Diagnosis not present

## 2016-12-18 DIAGNOSIS — F314 Bipolar disorder, current episode depressed, severe, without psychotic features: Secondary | ICD-10-CM | POA: Diagnosis not present

## 2016-12-18 DIAGNOSIS — G35 Multiple sclerosis: Secondary | ICD-10-CM | POA: Diagnosis not present

## 2016-12-18 DIAGNOSIS — Z9181 History of falling: Secondary | ICD-10-CM | POA: Diagnosis not present

## 2016-12-20 DIAGNOSIS — H532 Diplopia: Secondary | ICD-10-CM | POA: Diagnosis not present

## 2016-12-20 DIAGNOSIS — F17210 Nicotine dependence, cigarettes, uncomplicated: Secondary | ICD-10-CM | POA: Diagnosis not present

## 2016-12-20 DIAGNOSIS — Z9181 History of falling: Secondary | ICD-10-CM | POA: Diagnosis not present

## 2016-12-20 DIAGNOSIS — F314 Bipolar disorder, current episode depressed, severe, without psychotic features: Secondary | ICD-10-CM | POA: Diagnosis not present

## 2016-12-20 DIAGNOSIS — G35 Multiple sclerosis: Secondary | ICD-10-CM | POA: Diagnosis not present

## 2016-12-25 DIAGNOSIS — Z9181 History of falling: Secondary | ICD-10-CM | POA: Diagnosis not present

## 2016-12-25 DIAGNOSIS — F314 Bipolar disorder, current episode depressed, severe, without psychotic features: Secondary | ICD-10-CM | POA: Diagnosis not present

## 2016-12-25 DIAGNOSIS — G35 Multiple sclerosis: Secondary | ICD-10-CM | POA: Diagnosis not present

## 2016-12-25 DIAGNOSIS — F17210 Nicotine dependence, cigarettes, uncomplicated: Secondary | ICD-10-CM | POA: Diagnosis not present

## 2016-12-25 DIAGNOSIS — H532 Diplopia: Secondary | ICD-10-CM | POA: Diagnosis not present

## 2016-12-27 DIAGNOSIS — F17210 Nicotine dependence, cigarettes, uncomplicated: Secondary | ICD-10-CM | POA: Diagnosis not present

## 2016-12-27 DIAGNOSIS — G35 Multiple sclerosis: Secondary | ICD-10-CM | POA: Diagnosis not present

## 2016-12-27 DIAGNOSIS — Z9181 History of falling: Secondary | ICD-10-CM | POA: Diagnosis not present

## 2016-12-27 DIAGNOSIS — H532 Diplopia: Secondary | ICD-10-CM | POA: Diagnosis not present

## 2016-12-27 DIAGNOSIS — F314 Bipolar disorder, current episode depressed, severe, without psychotic features: Secondary | ICD-10-CM | POA: Diagnosis not present

## 2017-01-01 DIAGNOSIS — G35 Multiple sclerosis: Secondary | ICD-10-CM | POA: Diagnosis not present

## 2017-01-01 DIAGNOSIS — H532 Diplopia: Secondary | ICD-10-CM | POA: Diagnosis not present

## 2017-01-01 DIAGNOSIS — F17210 Nicotine dependence, cigarettes, uncomplicated: Secondary | ICD-10-CM | POA: Diagnosis not present

## 2017-01-01 DIAGNOSIS — F314 Bipolar disorder, current episode depressed, severe, without psychotic features: Secondary | ICD-10-CM | POA: Diagnosis not present

## 2017-01-01 DIAGNOSIS — Z9181 History of falling: Secondary | ICD-10-CM | POA: Diagnosis not present

## 2017-01-03 DIAGNOSIS — F17210 Nicotine dependence, cigarettes, uncomplicated: Secondary | ICD-10-CM | POA: Diagnosis not present

## 2017-01-03 DIAGNOSIS — Z9181 History of falling: Secondary | ICD-10-CM | POA: Diagnosis not present

## 2017-01-03 DIAGNOSIS — H532 Diplopia: Secondary | ICD-10-CM | POA: Diagnosis not present

## 2017-01-03 DIAGNOSIS — F314 Bipolar disorder, current episode depressed, severe, without psychotic features: Secondary | ICD-10-CM | POA: Diagnosis not present

## 2017-01-03 DIAGNOSIS — G35 Multiple sclerosis: Secondary | ICD-10-CM | POA: Diagnosis not present

## 2017-01-08 DIAGNOSIS — F1210 Cannabis abuse, uncomplicated: Secondary | ICD-10-CM | POA: Diagnosis not present

## 2017-01-08 DIAGNOSIS — F1010 Alcohol abuse, uncomplicated: Secondary | ICD-10-CM | POA: Diagnosis not present

## 2017-01-08 DIAGNOSIS — G35 Multiple sclerosis: Secondary | ICD-10-CM | POA: Diagnosis not present

## 2017-01-08 DIAGNOSIS — F341 Dysthymic disorder: Secondary | ICD-10-CM | POA: Diagnosis not present

## 2017-01-08 DIAGNOSIS — F17290 Nicotine dependence, other tobacco product, uncomplicated: Secondary | ICD-10-CM | POA: Diagnosis not present

## 2017-01-08 DIAGNOSIS — E785 Hyperlipidemia, unspecified: Secondary | ICD-10-CM | POA: Diagnosis not present

## 2017-01-09 ENCOUNTER — Telehealth: Payer: Self-pay | Admitting: Neurology

## 2017-01-09 DIAGNOSIS — F314 Bipolar disorder, current episode depressed, severe, without psychotic features: Secondary | ICD-10-CM | POA: Diagnosis not present

## 2017-01-09 DIAGNOSIS — Z9181 History of falling: Secondary | ICD-10-CM | POA: Diagnosis not present

## 2017-01-09 DIAGNOSIS — F17210 Nicotine dependence, cigarettes, uncomplicated: Secondary | ICD-10-CM | POA: Diagnosis not present

## 2017-01-09 DIAGNOSIS — G35 Multiple sclerosis: Secondary | ICD-10-CM | POA: Diagnosis not present

## 2017-01-09 DIAGNOSIS — H532 Diplopia: Secondary | ICD-10-CM | POA: Diagnosis not present

## 2017-01-09 NOTE — Telephone Encounter (Signed)
Beth(speech pathologist) @ [facility] is requesting speech therapy for 1 time a week for 4 weeks to address new onset of studdering and word findings.  Beth also mentioned concern that studdering may be from lithium levels.  Waynetta Sandy is requesting skilled nursing  For blood work check on lithium levels and for medication education.  Please call Beth @ [phone]

## 2017-01-09 NOTE — Telephone Encounter (Signed)
I have spoken with Beth and gave v/o for ST once weekly for 4 wks.  I explained, though, that stuttering is not new--has been going on since at least December 2017. Lithium level was slightly low at 0.58 in June 2018, and low normal at the check before that.  Psych manages Lithium.  At this time, RAS will not give orders for labs.  She verbalized understanding of same/fim

## 2017-01-10 ENCOUNTER — Telehealth: Payer: Self-pay | Admitting: Neurology

## 2017-01-10 NOTE — Telephone Encounter (Signed)
Nicolette/[facility] [phone] request VO for homehealth RN eval for medication management. Please call

## 2017-01-10 NOTE — Telephone Encounter (Signed)
I have spoken with [name] and explained that meds are managed between our office (neurology) and psych.  No current need for in home RN to manage meds/fim

## 2017-01-11 DIAGNOSIS — G35 Multiple sclerosis: Secondary | ICD-10-CM | POA: Diagnosis not present

## 2017-01-11 DIAGNOSIS — Z9181 History of falling: Secondary | ICD-10-CM | POA: Diagnosis not present

## 2017-01-11 DIAGNOSIS — F17210 Nicotine dependence, cigarettes, uncomplicated: Secondary | ICD-10-CM | POA: Diagnosis not present

## 2017-01-11 DIAGNOSIS — F314 Bipolar disorder, current episode depressed, severe, without psychotic features: Secondary | ICD-10-CM | POA: Diagnosis not present

## 2017-01-11 DIAGNOSIS — H532 Diplopia: Secondary | ICD-10-CM | POA: Diagnosis not present

## 2017-01-14 DIAGNOSIS — Z9181 History of falling: Secondary | ICD-10-CM | POA: Diagnosis not present

## 2017-01-14 DIAGNOSIS — H532 Diplopia: Secondary | ICD-10-CM | POA: Diagnosis not present

## 2017-01-14 DIAGNOSIS — F17210 Nicotine dependence, cigarettes, uncomplicated: Secondary | ICD-10-CM | POA: Diagnosis not present

## 2017-01-14 DIAGNOSIS — G35 Multiple sclerosis: Secondary | ICD-10-CM | POA: Diagnosis not present

## 2017-01-14 DIAGNOSIS — F314 Bipolar disorder, current episode depressed, severe, without psychotic features: Secondary | ICD-10-CM | POA: Diagnosis not present

## 2017-01-15 DIAGNOSIS — G35 Multiple sclerosis: Secondary | ICD-10-CM | POA: Diagnosis not present

## 2017-01-15 DIAGNOSIS — Z9181 History of falling: Secondary | ICD-10-CM | POA: Diagnosis not present

## 2017-01-15 DIAGNOSIS — H532 Diplopia: Secondary | ICD-10-CM | POA: Diagnosis not present

## 2017-01-15 DIAGNOSIS — F17210 Nicotine dependence, cigarettes, uncomplicated: Secondary | ICD-10-CM | POA: Diagnosis not present

## 2017-01-15 DIAGNOSIS — F314 Bipolar disorder, current episode depressed, severe, without psychotic features: Secondary | ICD-10-CM | POA: Diagnosis not present

## 2017-01-17 DIAGNOSIS — F17210 Nicotine dependence, cigarettes, uncomplicated: Secondary | ICD-10-CM | POA: Diagnosis not present

## 2017-01-17 DIAGNOSIS — Z9181 History of falling: Secondary | ICD-10-CM | POA: Diagnosis not present

## 2017-01-17 DIAGNOSIS — H532 Diplopia: Secondary | ICD-10-CM | POA: Diagnosis not present

## 2017-01-17 DIAGNOSIS — F314 Bipolar disorder, current episode depressed, severe, without psychotic features: Secondary | ICD-10-CM | POA: Diagnosis not present

## 2017-01-17 DIAGNOSIS — G35 Multiple sclerosis: Secondary | ICD-10-CM | POA: Diagnosis not present

## 2017-01-23 DIAGNOSIS — F17210 Nicotine dependence, cigarettes, uncomplicated: Secondary | ICD-10-CM | POA: Diagnosis not present

## 2017-01-23 DIAGNOSIS — H532 Diplopia: Secondary | ICD-10-CM | POA: Diagnosis not present

## 2017-01-23 DIAGNOSIS — Z9181 History of falling: Secondary | ICD-10-CM | POA: Diagnosis not present

## 2017-01-23 DIAGNOSIS — F314 Bipolar disorder, current episode depressed, severe, without psychotic features: Secondary | ICD-10-CM | POA: Diagnosis not present

## 2017-01-23 DIAGNOSIS — G35 Multiple sclerosis: Secondary | ICD-10-CM | POA: Diagnosis not present

## 2017-02-01 DIAGNOSIS — G35 Multiple sclerosis: Secondary | ICD-10-CM | POA: Diagnosis not present

## 2017-03-11 ENCOUNTER — Telehealth: Payer: Self-pay | Admitting: Neurology

## 2017-03-11 NOTE — Telephone Encounter (Signed)
Patient is calling to find out if we have received something in the mail with his name on it? He does not know where it would come from.

## 2017-03-11 NOTE — Telephone Encounter (Signed)
I have spoken with [name].  He sts. some paperwork will be faxed to our office, for [place] to completed for him, but he is not sure who is sending it or what it pertains to.  I will watch for any paperwork related to him/fim

## 2017-03-21 ENCOUNTER — Telehealth: Payer: Self-pay | Admitting: *Deleted

## 2017-03-21 NOTE — Telephone Encounter (Signed)
Pt [employer] form on Faith desk. 

## 2017-03-21 NOTE — Telephone Encounter (Signed)
Disability forms completed and faxed back [employer],  to Ranae PilaStephen Gillie, fax# [phone], as requested/fim

## 2017-03-21 NOTE — Telephone Encounter (Signed)
Correction--[phone] is a phone#.  I have faxed forms to [employer], fax# [phone]/fim

## 2017-04-05 ENCOUNTER — Ambulatory Visit: Payer: Medicare HMO | Admitting: Neurology

## 2017-04-08 ENCOUNTER — Ambulatory Visit: Payer: Medicare HMO | Admitting: Neurology

## 2017-04-08 ENCOUNTER — Encounter: Payer: Self-pay | Admitting: Neurology

## 2017-04-08 ENCOUNTER — Other Ambulatory Visit: Payer: Self-pay

## 2017-04-08 VITALS — BP 125/73 | HR 83 | Resp 20 | Wt 176.5 lb

## 2017-04-08 DIAGNOSIS — Z79899 Other long term (current) drug therapy: Secondary | ICD-10-CM

## 2017-04-08 DIAGNOSIS — F314 Bipolar disorder, current episode depressed, severe, without psychotic features: Secondary | ICD-10-CM | POA: Diagnosis not present

## 2017-04-08 DIAGNOSIS — M533 Sacrococcygeal disorders, not elsewhere classified: Secondary | ICD-10-CM

## 2017-04-08 DIAGNOSIS — G35 Multiple sclerosis: Secondary | ICD-10-CM

## 2017-04-08 DIAGNOSIS — R269 Unspecified abnormalities of gait and mobility: Secondary | ICD-10-CM | POA: Diagnosis not present

## 2017-04-08 NOTE — Patient Instructions (Signed)
Take ibuprofen 200 mg, 3 pills 3 times a day

## 2017-04-08 NOTE — Progress Notes (Signed)
[facility]  PATIENT: [name] DOB: 16-Apr-1956  REFERRING DOCTOR OR PCP: Dr.  Shana Chute SOURCE: patient and records from [facility]  _________________________________   HISTORICAL  CHIEF COMPLAINT:  Chief Complaint  Patient presents with  . Multiple Sclerosis    Sts. he is compliant with Tecfidera and tolerates it well.  Needs to discuss if he should restart Abilify. C/O coccygeal pain without known injury, onset 2 wks ago/fim    HISTORY OF PRESENT ILLNESS:  [name] is a 61 year old man with relapsing remitting multiple sclerosis and bipolar disease.      Update 04/08/2017: [name] continues on [place]. He is tolerating it well. He has not had any exacerbations while on it.   His last MRI showed no acute findings.     He notes his gait is off balanced.   He had to be extremely careful with the recent snow.  He shuffles but he has not had any falls  He has not noted more pain in the coccyx region. He denies any trauma or injury.   The onset was sudden (he just woke up with it).  Pain is midline.    Pain is worse with sitting and especially when trying to stand.    He denies pain elsewhere.   No radiation into legs.   No new numbness or weakness.   No change in bladder.      He feels his voice is better and he has less stuttering. He still sometimes feels in a mental fog.[name]   He has bipolar disease.   He hasn't seen psychiatry recently due to high copay.     He stopped Abilify but is still on escitalopram, buproprion and lithium.     From 10/04/2016: He recently had a Psych admission at [facility] for depression ans suicidal ideation.    Abilify was added (has only been on a few days).  MS:   He has been on Tecfidera since late 2013 with a gap earlier this year when he ran out.   Marland KitchenHe tolerates it well and has not had any definite exacerbation while on it. A recent lymphocyte count was good at 1.1 (09/23/16).    The MRI of the brain 07/2015 was  stale.   It shows T2/FLAIR hyperintense foci in a pattern and configuration consistent with multiple sclerosis.   The MRI of the cervical spine shows some degenerative changes but no  MS plaques  Tremor:  He notes a rapid fine tremor in his hands that is not too bothersome.  Of note, lithium has recently been added but he did not have tremor the last time.    Tinnitus:   He notes a ringing tinnitus bilateral that has been present a while but seems worse.   Stutter:   He feels his stuttering speech is worse and "I can't half talk" .    notes a stuttering speech since being started on Lithium.    He finds this frustrating and also notes tha the has trouble coming up with the right words.     Gait/strength/sensation: He feels gait is worse.  He stumbles more.   He relies on his cane.   Balance is poor. No recent fall.   He notes mild weakness in both legs associated with some spasticity. He denies weakness or numbness in the arms..   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   Bladder:  He notes mild hesitancy but this is stable.   He also has some urgency, frequency.   No incontinence or UTI.  e has occasional constipation but had diarrhea 2 weeks ago. He has not had any urinary or fecal incontinence in the past few years.    Vision:   He denies visual blurring.[name]  He notes diplopia when he looks either left or right.   He denies any eye pain.  Fatigue/sleep: He reports mild fatigue, about the same as usual for him.   He does not have to take naps. Amantadine helped in the past but stopped helping so he stopped.[name] He sleeps well most nights getting 7 hours or so.     Mood/cognition:    He has bipolar disease and has had multiple inpatient psychiatric admissions with various changes of medications over the years. He is on lithium and  Abilify.    He has some depression but feels he is fairly stable.    MS History:   He presented with double vision in 2000.  MRI at that time showed lesions in the brain consistent with multiple sclerosis. He also had a lumbar puncture and the CSF was consistent with MS.   He was started on Avonex.  He had another episode of diplopia followed by weakness in both legs 3 years later.    He was switched to Copaxone for a while. Unfortunate, he had an another severe exacerbation with his legs this. He was started on Tysabri once a month.   For several years, he stayed to [place] but stopped as it was difficult for him to get the infusions. In 2011, he switched to [place]. 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 He had one exacerbation with weakness in 2012. He then started Tecfidera in late 2013. He has generally tolerated it well. He did not have any significant flushing or GI side effects. He has not had any definite exacerbations while on Tecfidera.      REVIEW OF SYSTEMS: Constitutional: No fevers, chills, sweats, or change in appetite.   Fatigue Eyes: Notes double vision.  No eye pain Ear, nose and throat: No hearing loss, ear pain, nasal congestion, sore throat Cardiovascular: No chest pain, palpitations Respiratory: No shortness of breath at rest or with exertion.   No wheezes.  Occ coughs (smokes) GastrointestinaI: No nausea, vomiting, diarrhea, abdominal pain, fecal incontinence Genitourinary: No dysuria, urinary retention.  Mild frequency.  No nocturia. Musculoskeletal: No neck pain, mild back pain Integumentary: No rash, pruritus, skin lesions Neurological: as above Psychiatric: Notes depression and anxiety.   He has bipolar disorder Endocrine: No palpitations, diaphoresis, change in appetite, change in weigh or increased thirst Hematologic/Lymphatic: No anemia, purpura, petechiae. Allergic/Immunologic: No itchy/runny eyes, nasal  congestion, recent allergic reactions, rashes  ALLERGIES: No Known Allergies  HOME MEDICATIONS:  Current Outpatient Medications:  .  buPROPion (WELLBUTRIN XL) 300 MG 24 hr tablet, Take 1 tablet (300 mg total) by mouth daily. For depression, Disp: 30 tablet, Rfl: 0 .  Dimethyl Fumarate (TECFIDERA) 240 MG CPDR, Take 1 capsule (240 mg total) by mouth 2 (two) times daily., Disp: 180 capsule, Rfl: 3 .  escitalopram (LEXAPRO) 20 MG tablet, Take 1 tablet (20 mg total) by mouth daily. For depression, Disp: 30 tablet, Rfl: 0 .  lithium carbonate (ESKALITH) 450 MG CR tablet, Take 450 mg by mouth 2 (  two) times daily., Disp: , Rfl: 0 .  ARIPiprazole (ABILIFY) 5 MG tablet, Take by mouth., Disp: , Rfl:   PAST MEDICAL HISTORY: Past Medical History:  Diagnosis Date  . Depression   . Mental disorder   . Movement disorder   . MS (multiple sclerosis) (HCC)   . Multiple sclerosis, relapsing-remitting (HCC)   . Neuromuscular disorder (HCC)   . Vision abnormalities     PAST SURGICAL HISTORY: Past Surgical History:  Procedure Laterality Date  . TONSILLECTOMY      FAMILY HISTORY: Family History  Problem Relation Age of Onset  . Heart disease Mother   . Stroke Father     SOCIAL HISTORY:  Social History   Socioeconomic History  . Marital status: Legally Separated    Spouse name: Not on file  . Number of children: Not on file  . Years of education: Not on file  . Highest education level: Not on file  Social Needs  . Financial resource strain: Not on file  . Food insecurity - worry: Not on file  . Food insecurity - inability: Not on file  . Transportation needs - medical: Not on file  . Transportation needs - non-medical: Not on file  Occupational History  . Not on file  Tobacco Use  . Smoking status: Current Every Day Smoker    Packs/day: 3.00    Years: 36.00    Pack years: 108.00    Types: Cigarettes  . Smokeless tobacco: Never Used  Substance and Sexual Activity  . Alcohol  use: Yes    Alcohol/week: 0.0 oz    Comment: 2-3 times a week.   . Drug use: Yes    Types: Marijuana    Comment: 1-2 times a month  . Sexual activity: Not Currently    Birth control/protection: None  Other Topics Concern  . Not on file  Social History Narrative   ** Merged History Encounter **         PHYSICAL EXAM  Vitals:   04/08/17 1414  BP: 125/73  Pulse: 83  Resp: 20  Weight: 176 lb 8 oz (80.1 kg)    Body mass index is 24.62 kg/m.   General: The patient is well-developed and well-nourished and in no acute distress  Musculoskeletal:  Back is tender over the lower sacrum and coccyx. No tenderness higher.  Neurologic Exam  Mental status: The patient is alert and oriented x 3 at the time of the examination.  The patient has apparent normal recent and remote memory, with an apparently normal attention span and concentration ability. The speech is pretty normal today per   Cranial nerves: Extraocular muscles appear to be intact. Facial strength and sensation was normal.     Trapezius and sternocleidomastoid strength is normal. No dysarthria is noted.    No obvious hearing deficits are noted.  Motor:  He has a low amplitude high frequency rest tremor.  Muscle bulk is normal.   Tone is increased in legs. Strength is  5 / 5 in all 4 extremities except 4+/5 in hip flexors   Sensory: Sensory testing is intact to soft touch and vibration sensation in all 4 extremities Except for numbness over the left lateral femoral cutaneous nerve distribution.  Coordination: Cerebellar testing reveals good finger-nose-finger and poor heel-to-shin bilaterally.  Gait and station: Station is wide. The gait is wide with some shuffling initially but in the hallway he took a better stride. He uses a cane.  Romberg is negative.   Reflexes:  Deep tendon reflexes are symmetric and 3+ bilaterally.    DTRs are increased with sread at the knees,     DIAGNOSTIC DATA (LABS, IMAGING, TESTING) - I  reviewed patient records, labs, notes, testing and imaging myself where available.  Lab Results  Component Value Date   WBC 4.3 09/23/2016   HGB 14.5 09/23/2016   HCT 43.1 09/23/2016   MCV 96.9 09/23/2016   PLT 173 09/23/2016      Component Value Date/Time   NA 139 09/23/2016 1944   NA 146 (H) 07/15/2015 1134   K 4.5 09/23/2016 1944   CL 108 09/23/2016 1944   CO2 25 09/23/2016 1944   GLUCOSE 104 (H) 09/23/2016 1944   BUN 13 09/23/2016 1944   BUN 10 07/15/2015 1134   CREATININE 1.04 09/23/2016 1944   CALCIUM 8.8 (L) 09/23/2016 1944   PROT 6.5 09/23/2016 1944   PROT 6.6 07/15/2015 1134   ALBUMIN 3.6 09/23/2016 1944   ALBUMIN 4.0 07/15/2015 1134   AST 19 09/23/2016 1944   ALT 14 (L) 09/23/2016 1944   ALKPHOS 54 09/23/2016 1944   BILITOT 0.5 09/23/2016 1944   BILITOT 0.3 07/15/2015 1134   GFRNONAA >60 09/23/2016 1944   GFRAA >60 09/23/2016 1944    No results found for: [id number] Lab Results  Component Value Date   TSH 7.010 (H) 04/05/2016     ASSESSMENT AND PLAN  Multiple sclerosis (HCC) - Plan: CBC with Differential/Platelet  Bipolar affective disorder, depressed, severe (HCC) - Plan: Lithium level  Gait disturbance  High risk medication use  Coccygeal pain, acute - Plan: DG Sacrum/Coccyx   1.  Today we will check a CBC with differential to make sure he does not have a lymphopenia. He will continue the Tecfidera.   2.  For the coccygeal pain, I will check an x-ray and have him take Motrin 600 mg 3 times a day. 3.   He is advised to follow-up with psychiatry/Behavioral Health. 4.   He will return to see me in 6 months or sooner if he has new or worsening neurologic symptoms.   Richard A. Epimenio FootSater, MD, PhD 04/08/2017, 2:34 PM Certified in Neurology, Clinical Neurophysiology, Sleep Medicine, Pain Medicine and Neuroimaging  [facility] [address], [address] [place], [place] [address] [phone]

## 2017-04-09 LAB — CBC WITH DIFFERENTIAL/PLATELET
Basophils Absolute: 0 10*3/uL (ref 0.0–0.2)
Basos: 0 %
EOS (ABSOLUTE): 0.1 10*3/uL (ref 0.0–0.4)
Eos: 1 %
Hematocrit: 44.6 % (ref 37.5–51.0)
Hemoglobin: 15 g/dL (ref 13.0–17.7)
Immature Grans (Abs): 0 10*3/uL (ref 0.0–0.1)
Immature Granulocytes: 0 %
Lymphocytes Absolute: 0.6 10*3/uL — ABNORMAL LOW (ref 0.7–3.1)
Lymphs: 12 %
MCH: 33.2 pg — ABNORMAL HIGH (ref 26.6–33.0)
MCHC: 33.6 g/dL (ref 31.5–35.7)
MCV: 99 fL — ABNORMAL HIGH (ref 79–97)
Monocytes Absolute: 0.4 10*3/uL (ref 0.1–0.9)
Monocytes: 8 %
Neutrophils Absolute: 3.7 10*3/uL (ref 1.4–7.0)
Neutrophils: 79 %
Platelets: 269 10*3/uL (ref 150–379)
RBC: 4.52 x10E6/uL (ref 4.14–5.80)
RDW: 14.2 % (ref 12.3–15.4)
WBC: 4.8 10*3/uL (ref 3.4–10.8)

## 2017-04-09 LAB — LITHIUM LEVEL: Lithium Lvl: 0.8 mmol/L (ref 0.6–1.2)

## 2017-04-11 ENCOUNTER — Telehealth: Payer: Self-pay | Admitting: *Deleted

## 2017-04-11 NOTE — Telephone Encounter (Signed)
Spoke with pt. and reviewed below lab results/fim 

## 2017-04-11 NOTE — Telephone Encounter (Signed)
-----   Message from Asa Lente, MD sent at 04/09/2017  5:40 PM EST ----- Please let the patient know that the lab work is fine.

## 2017-05-08 DIAGNOSIS — F17290 Nicotine dependence, other tobacco product, uncomplicated: Secondary | ICD-10-CM | POA: Diagnosis not present

## 2017-05-08 DIAGNOSIS — G35 Multiple sclerosis: Secondary | ICD-10-CM | POA: Diagnosis not present

## 2017-05-08 DIAGNOSIS — F1010 Alcohol abuse, uncomplicated: Secondary | ICD-10-CM | POA: Diagnosis not present

## 2017-05-08 DIAGNOSIS — E785 Hyperlipidemia, unspecified: Secondary | ICD-10-CM | POA: Diagnosis not present

## 2017-07-25 ENCOUNTER — Other Ambulatory Visit: Payer: Self-pay | Admitting: Neurology

## 2017-07-25 NOTE — Telephone Encounter (Signed)
Pt has called stating that he is completely out of Dimethyl Fumarate (TECFIDERA) 240 MG CPDR.  Pt states he has been out for a couple of days, likely since Monday or Tues.

## 2017-07-25 NOTE — Telephone Encounter (Signed)
I tried to call pt. back but reached message stating vm is not set up, call back later.  If he calls back, please let him know he needs to call [employer] (the company that makes his Tecfidera) at [phone] and they can help him reorder his medication./fim

## 2017-07-25 NOTE — Telephone Encounter (Signed)
LMTC./fim 

## 2017-07-25 NOTE — Telephone Encounter (Signed)
Pt called said he is not having success with [employer]. Pt was advised to call [employer] for refill, he told me what the phone number was, pt said he would try again but if he could not get it this time he was going to forget it.   FYI

## 2017-07-25 NOTE — Telephone Encounter (Signed)
Pt returned RN's call. Message relayed, phone given to pt, he will call [employer].  FYI

## 2017-07-25 NOTE — Telephone Encounter (Signed)
Noted/fim 

## 2017-07-25 NOTE — Telephone Encounter (Signed)
FYI Pt called asking for Arlys Dequavious.  Pt mentioned he is trying to get his Tecfidera.  Pt was given the # that RN Rushie Goltz put in her message [phone] .  Pt states he will call.  He has not requested a call back from [employer]

## 2017-07-26 NOTE — Telephone Encounter (Addendum)
Left another message for patient requesting a return call if he was still having problems with [employer].

## 2017-07-31 ENCOUNTER — Telehealth: Payer: Self-pay | Admitting: *Deleted

## 2017-07-31 MED ORDER — DIMETHYL FUMARATE 240 MG PO CPDR: 240.0000 mg | DELAYED_RELEASE_CAPSULE | Freq: Two times a day (BID) | ORAL | 3 refills | Status: DC

## 2017-07-31 NOTE — Telephone Encounter (Signed)
Initiated PA Tecfidera 240mg  cap on covermymeds. Key: [id number]. In process of completing.

## 2017-07-31 NOTE — Telephone Encounter (Signed)
Submitted PA on covermymeds. Waiting on determination from [facility].

## 2017-07-31 NOTE — Telephone Encounter (Signed)
Spoke with Lloyd Huger at [facility] and gave verbal rx. for Tecfidera 240mg  #180 with 3 r/f, one capsule po bid/fim

## 2017-08-01 NOTE — Telephone Encounter (Addendum)
Received fax notification from [facility] that PA Tecfidera DR 240mg  cap approved effective 07/31/17-08/01/2018 under Part D benefit for 2 years for 30 day supply, not a 90 day supply. Specialty drugs under plan are limited to a 30-day supply.    Questions? Contact [phone].  Faxed notice of approval for 30-day supply to [facility] [phone]. Received fax confirmation.

## 2017-08-07 DIAGNOSIS — F1010 Alcohol abuse, uncomplicated: Secondary | ICD-10-CM | POA: Diagnosis not present

## 2017-08-07 DIAGNOSIS — E785 Hyperlipidemia, unspecified: Secondary | ICD-10-CM | POA: Diagnosis not present

## 2017-08-07 DIAGNOSIS — F17290 Nicotine dependence, other tobacco product, uncomplicated: Secondary | ICD-10-CM | POA: Diagnosis not present

## 2017-08-07 DIAGNOSIS — F1210 Cannabis abuse, uncomplicated: Secondary | ICD-10-CM | POA: Diagnosis not present

## 2017-08-07 DIAGNOSIS — G35 Multiple sclerosis: Secondary | ICD-10-CM | POA: Diagnosis not present

## 2017-10-07 ENCOUNTER — Ambulatory Visit: Payer: Medicare HMO | Admitting: Neurology

## 2017-10-16 ENCOUNTER — Other Ambulatory Visit: Payer: Self-pay

## 2017-10-16 ENCOUNTER — Inpatient Hospital Stay (HOSPITAL_COMMUNITY)
Admission: EM | Admit: 2017-10-16 | Discharge: 2017-11-11 | DRG: 356 | Disposition: A | Discharge status: Skilled Nursing Facility | Payer: Medicare HMO | Attending: Internal Medicine | Admitting: Internal Medicine

## 2017-10-16 ENCOUNTER — Encounter (HOSPITAL_COMMUNITY): Payer: Self-pay

## 2017-10-16 DIAGNOSIS — R55 Syncope and collapse: Secondary | ICD-10-CM

## 2017-10-16 DIAGNOSIS — E46 Unspecified protein-calorie malnutrition: Secondary | ICD-10-CM | POA: Diagnosis present

## 2017-10-16 DIAGNOSIS — R1111 Vomiting without nausea: Secondary | ICD-10-CM | POA: Diagnosis not present

## 2017-10-16 DIAGNOSIS — E870 Hyperosmolality and hypernatremia: Secondary | ICD-10-CM | POA: Diagnosis not present

## 2017-10-16 DIAGNOSIS — Z7189 Other specified counseling: Secondary | ICD-10-CM

## 2017-10-16 DIAGNOSIS — Z915 Personal history of self-harm: Secondary | ICD-10-CM

## 2017-10-16 DIAGNOSIS — E876 Hypokalemia: Secondary | ICD-10-CM | POA: Diagnosis not present

## 2017-10-16 DIAGNOSIS — F05 Delirium due to known physiological condition: Secondary | ICD-10-CM | POA: Diagnosis not present

## 2017-10-16 DIAGNOSIS — R4182 Altered mental status, unspecified: Secondary | ICD-10-CM | POA: Diagnosis not present

## 2017-10-16 DIAGNOSIS — Z7401 Bed confinement status: Secondary | ICD-10-CM | POA: Diagnosis not present

## 2017-10-16 DIAGNOSIS — Z6820 Body mass index (BMI) 20.0-20.9, adult: Secondary | ICD-10-CM | POA: Diagnosis not present

## 2017-10-16 DIAGNOSIS — Z8249 Family history of ischemic heart disease and other diseases of the circulatory system: Secondary | ICD-10-CM | POA: Diagnosis not present

## 2017-10-16 DIAGNOSIS — F1290 Cannabis use, unspecified, uncomplicated: Secondary | ICD-10-CM | POA: Diagnosis not present

## 2017-10-16 DIAGNOSIS — F339 Major depressive disorder, recurrent, unspecified: Secondary | ICD-10-CM | POA: Diagnosis not present

## 2017-10-16 DIAGNOSIS — M6281 Muscle weakness (generalized): Secondary | ICD-10-CM | POA: Diagnosis not present

## 2017-10-16 DIAGNOSIS — E875 Hyperkalemia: Secondary | ICD-10-CM | POA: Diagnosis present

## 2017-10-16 DIAGNOSIS — Z823 Family history of stroke: Secondary | ICD-10-CM | POA: Diagnosis not present

## 2017-10-16 DIAGNOSIS — K208 Other esophagitis: Secondary | ICD-10-CM | POA: Diagnosis present

## 2017-10-16 DIAGNOSIS — N179 Acute kidney failure, unspecified: Secondary | ICD-10-CM | POA: Diagnosis present

## 2017-10-16 DIAGNOSIS — G9340 Encephalopathy, unspecified: Secondary | ICD-10-CM | POA: Diagnosis not present

## 2017-10-16 DIAGNOSIS — K921 Melena: Secondary | ICD-10-CM | POA: Diagnosis not present

## 2017-10-16 DIAGNOSIS — G9341 Metabolic encephalopathy: Secondary | ICD-10-CM | POA: Diagnosis not present

## 2017-10-16 DIAGNOSIS — K2950 Unspecified chronic gastritis without bleeding: Secondary | ICD-10-CM | POA: Diagnosis present

## 2017-10-16 DIAGNOSIS — R000 Tachycardia, unspecified: Secondary | ICD-10-CM | POA: Diagnosis not present

## 2017-10-16 DIAGNOSIS — Z781 Physical restraint status: Secondary | ICD-10-CM

## 2017-10-16 DIAGNOSIS — W19XXXA Unspecified fall, initial encounter: Secondary | ICD-10-CM | POA: Diagnosis not present

## 2017-10-16 DIAGNOSIS — K922 Gastrointestinal hemorrhage, unspecified: Secondary | ICD-10-CM

## 2017-10-16 DIAGNOSIS — Z720 Tobacco use: Secondary | ICD-10-CM

## 2017-10-16 DIAGNOSIS — R2681 Unsteadiness on feet: Secondary | ICD-10-CM | POA: Diagnosis not present

## 2017-10-16 DIAGNOSIS — G9349 Other encephalopathy: Secondary | ICD-10-CM | POA: Diagnosis not present

## 2017-10-16 DIAGNOSIS — D62 Acute posthemorrhagic anemia: Secondary | ICD-10-CM | POA: Diagnosis present

## 2017-10-16 DIAGNOSIS — F314 Bipolar disorder, current episode depressed, severe, without psychotic features: Secondary | ICD-10-CM | POA: Diagnosis present

## 2017-10-16 DIAGNOSIS — F319 Bipolar disorder, unspecified: Secondary | ICD-10-CM | POA: Diagnosis present

## 2017-10-16 DIAGNOSIS — R112 Nausea with vomiting, unspecified: Secondary | ICD-10-CM | POA: Diagnosis not present

## 2017-10-16 DIAGNOSIS — K264 Chronic or unspecified duodenal ulcer with hemorrhage: Secondary | ICD-10-CM | POA: Diagnosis present

## 2017-10-16 DIAGNOSIS — K2211 Ulcer of esophagus with bleeding: Secondary | ICD-10-CM | POA: Diagnosis not present

## 2017-10-16 DIAGNOSIS — F341 Dysthymic disorder: Secondary | ICD-10-CM | POA: Diagnosis not present

## 2017-10-16 DIAGNOSIS — Z4659 Encounter for fitting and adjustment of other gastrointestinal appliance and device: Secondary | ICD-10-CM

## 2017-10-16 DIAGNOSIS — G35 Multiple sclerosis: Secondary | ICD-10-CM | POA: Diagnosis present

## 2017-10-16 DIAGNOSIS — D649 Anemia, unspecified: Secondary | ICD-10-CM | POA: Diagnosis not present

## 2017-10-16 DIAGNOSIS — R451 Restlessness and agitation: Secondary | ICD-10-CM | POA: Diagnosis not present

## 2017-10-16 DIAGNOSIS — F3175 Bipolar disorder, in partial remission, most recent episode depressed: Secondary | ICD-10-CM | POA: Diagnosis not present

## 2017-10-16 DIAGNOSIS — Z4682 Encounter for fitting and adjustment of non-vascular catheter: Secondary | ICD-10-CM | POA: Diagnosis not present

## 2017-10-16 DIAGNOSIS — F17210 Nicotine dependence, cigarettes, uncomplicated: Secondary | ICD-10-CM | POA: Diagnosis present

## 2017-10-16 DIAGNOSIS — Z515 Encounter for palliative care: Secondary | ICD-10-CM | POA: Diagnosis not present

## 2017-10-16 DIAGNOSIS — R410 Disorientation, unspecified: Secondary | ICD-10-CM | POA: Diagnosis not present

## 2017-10-16 DIAGNOSIS — K269 Duodenal ulcer, unspecified as acute or chronic, without hemorrhage or perforation: Secondary | ICD-10-CM | POA: Diagnosis not present

## 2017-10-16 DIAGNOSIS — F1210 Cannabis abuse, uncomplicated: Secondary | ICD-10-CM | POA: Diagnosis not present

## 2017-10-16 DIAGNOSIS — R0989 Other specified symptoms and signs involving the circulatory and respiratory systems: Secondary | ICD-10-CM | POA: Diagnosis not present

## 2017-10-16 DIAGNOSIS — F1010 Alcohol abuse, uncomplicated: Secondary | ICD-10-CM | POA: Diagnosis present

## 2017-10-16 DIAGNOSIS — M2550 Pain in unspecified joint: Secondary | ICD-10-CM | POA: Diagnosis not present

## 2017-10-16 DIAGNOSIS — F3130 Bipolar disorder, current episode depressed, mild or moderate severity, unspecified: Secondary | ICD-10-CM | POA: Diagnosis not present

## 2017-10-16 LAB — CBC
HCT: 30.4 % — ABNORMAL LOW (ref 39.0–52.0)
HCT: 26.6 % — ABNORMAL LOW (ref 39.0–52.0)
HCT: 26.8 % — ABNORMAL LOW (ref 39.0–52.0)
Hemoglobin: 10.1 g/dL — ABNORMAL LOW (ref 13.0–17.0)
Hemoglobin: 8.6 g/dL — ABNORMAL LOW (ref 13.0–17.0)
Hemoglobin: 9.1 g/dL — ABNORMAL LOW (ref 13.0–17.0)
MCH: 32.8 pg (ref 26.0–34.0)
MCH: 32.5 pg (ref 26.0–34.0)
MCH: 32.4 pg (ref 26.0–34.0)
MCHC: 33.2 g/dL (ref 30.0–36.0)
MCHC: 32.3 g/dL (ref 30.0–36.0)
MCHC: 34 g/dL (ref 30.0–36.0)
MCV: 98.7 fL (ref 78.0–100.0)
MCV: 100.4 fL — ABNORMAL HIGH (ref 78.0–100.0)
MCV: 95.4 fL (ref 78.0–100.0)
Platelets: 298 10*3/uL (ref 150–400)
Platelets: 279 10*3/uL (ref 150–400)
Platelets: 224 10*3/uL (ref 150–400)
RBC: 3.08 MIL/uL — ABNORMAL LOW (ref 4.22–5.81)
RBC: 2.65 MIL/uL — ABNORMAL LOW (ref 4.22–5.81)
RBC: 2.81 MIL/uL — ABNORMAL LOW (ref 4.22–5.81)
RDW: 13.1 % (ref 11.5–15.5)
RDW: 13.2 % (ref 11.5–15.5)
RDW: 14.9 % (ref 11.5–15.5)
WBC: 9.8 10*3/uL (ref 4.0–10.5)
WBC: 8.6 10*3/uL (ref 4.0–10.5)
WBC: 8 10*3/uL (ref 4.0–10.5)

## 2017-10-16 LAB — COMPREHENSIVE METABOLIC PANEL
ALT: 12 U/L (ref 0–44)
AST: 19 U/L (ref 15–41)
Albumin: 3.2 g/dL — ABNORMAL LOW (ref 3.5–5.0)
Alkaline Phosphatase: 50 U/L (ref 38–126)
Anion gap: 10 (ref 5–15)
BUN: 65 mg/dL — ABNORMAL HIGH (ref 8–23)
CO2: 22 mmol/L (ref 22–32)
Calcium: 8.5 mg/dL — ABNORMAL LOW (ref 8.9–10.3)
Chloride: 108 mmol/L (ref 98–111)
Creatinine, Ser: 1.4 mg/dL — ABNORMAL HIGH (ref 0.61–1.24)
GFR calc Af Amer: >60 mL/min (ref >60)
GFR calc non Af Amer: 52 mL/min — ABNORMAL LOW (ref >60)
Glucose, Bld: 134 mg/dL — ABNORMAL HIGH (ref 70–99)
Potassium: 5.2 mmol/L — ABNORMAL HIGH (ref 3.5–5.1)
Sodium: 140 mmol/L (ref 135–145)
Total Bilirubin: 0.4 mg/dL (ref 0.3–1.2)
Total Protein: 6.5 g/dL (ref 6.5–8.1)

## 2017-10-16 LAB — ABO/RH: ABO/RH(D): A POS

## 2017-10-16 LAB — PROTIME-INR
INR: 1.14
Prothrombin Time: 14.5 seconds (ref 11.4–15.2)

## 2017-10-16 LAB — MRSA PCR SCREENING: MRSA by PCR: NEGATIVE

## 2017-10-16 LAB — POC OCCULT BLOOD, ED: Fecal Occult Bld: POSITIVE — AB

## 2017-10-16 LAB — PREPARE RBC (CROSSMATCH)

## 2017-10-16 MED ORDER — SODIUM CHLORIDE 0.9 % IV SOLN
8.0000 mg/h | INTRAVENOUS | Status: DC
  Administered 2017-10-16 – 2017-10-19 (×5): 8 mg/h via INTRAVENOUS
  Filled 2017-10-16 (×13): qty 80

## 2017-10-16 MED ORDER — ARIPIPRAZOLE 5 MG PO TABS
5.0000 mg | ORAL_TABLET | Freq: Every day | ORAL | Status: DC
  Administered 2017-10-17 – 2017-10-20 (×4): 5 mg via ORAL
  Filled 2017-10-16 (×8): qty 1

## 2017-10-16 MED ORDER — SENNOSIDES-DOCUSATE SODIUM 8.6-50 MG PO TABS: 1.0000 | ORAL_TABLET | Freq: Every evening | ORAL | Status: DC | PRN

## 2017-10-16 MED ORDER — SODIUM CHLORIDE 0.9 % IV SOLN
80.0000 mg | Freq: Once | INTRAVENOUS | Status: AC
  Administered 2017-10-16: 12:00:00 80 mg via INTRAVENOUS
  Filled 2017-10-16: qty 80

## 2017-10-16 MED ORDER — SODIUM CHLORIDE 0.9 % IV BOLUS
1000.0000 mL | Freq: Once | INTRAVENOUS | Status: AC
  Administered 2017-10-16: 1000 mL via INTRAVENOUS

## 2017-10-16 MED ORDER — LORAZEPAM 1 MG PO TABS
1.0000 mg | ORAL_TABLET | Freq: Four times a day (QID) | ORAL | Status: DC | PRN
  Administered 2017-10-16: 1 mg via ORAL
  Filled 2017-10-16: qty 1

## 2017-10-16 MED ORDER — ESCITALOPRAM OXALATE 20 MG PO TABS
20.0000 mg | ORAL_TABLET | Freq: Every day | ORAL | Status: DC
  Administered 2017-10-16 – 2017-10-20 (×5): 20 mg via ORAL
  Filled 2017-10-16 (×6): qty 1

## 2017-10-16 MED ORDER — FOLIC ACID 1 MG PO TABS
1.0000 mg | ORAL_TABLET | Freq: Every day | ORAL | Status: DC
  Administered 2017-10-16 – 2017-10-20 (×5): 1 mg via ORAL
  Filled 2017-10-16 (×6): qty 1

## 2017-10-16 MED ORDER — ACETAMINOPHEN 325 MG PO TABS
650.0000 mg | ORAL_TABLET | Freq: Four times a day (QID) | ORAL | Status: DC | PRN
  Administered 2017-10-18 – 2017-10-30 (×3): 650 mg via ORAL
  Filled 2017-10-16 (×3): qty 2

## 2017-10-16 MED ORDER — THIAMINE HCL 100 MG/ML IJ SOLN
100.0000 mg | Freq: Every day | INTRAMUSCULAR | Status: DC
  Administered 2017-10-19 – 2017-10-24 (×5): 100 mg via INTRAVENOUS
  Filled 2017-10-16 (×5): qty 2

## 2017-10-16 MED ORDER — ACETAMINOPHEN 650 MG RE SUPP: 650.0000 mg | Freq: Four times a day (QID) | RECTAL | Status: DC | PRN

## 2017-10-16 MED ORDER — BUPROPION HCL ER (XL) 300 MG PO TB24
300.0000 mg | ORAL_TABLET | Freq: Every day | ORAL | Status: DC
  Administered 2017-10-16 – 2017-10-20 (×5): 300 mg via ORAL
  Filled 2017-10-16: qty 2
  Filled 2017-10-16 (×4): qty 1
  Filled 2017-10-16: qty 2
  Filled 2017-10-16: qty 1

## 2017-10-16 MED ORDER — SODIUM CHLORIDE 0.9% FLUSH
3.0000 mL | Freq: Two times a day (BID) | INTRAVENOUS | Status: DC
  Administered 2017-10-16 – 2017-11-05 (×26): 3 mL via INTRAVENOUS

## 2017-10-16 MED ORDER — SODIUM CHLORIDE 0.9 % IV SOLN
10.0000 mL/h | Freq: Once | INTRAVENOUS | Status: AC
  Administered 2017-10-16: 10 mL/h via INTRAVENOUS

## 2017-10-16 MED ORDER — VITAMIN B-1 100 MG PO TABS
100.0000 mg | ORAL_TABLET | Freq: Every day | ORAL | Status: DC
  Administered 2017-10-16 – 2017-11-11 (×16): 100 mg via ORAL
  Filled 2017-10-16 (×20): qty 1

## 2017-10-16 MED ORDER — LITHIUM CARBONATE ER 450 MG PO TBCR
450.0000 mg | EXTENDED_RELEASE_TABLET | Freq: Two times a day (BID) | ORAL | Status: DC
  Administered 2017-10-16 – 2017-10-20 (×8): 450 mg via ORAL
  Filled 2017-10-16 (×14): qty 1

## 2017-10-16 MED ORDER — ONDANSETRON HCL 4 MG/2ML IJ SOLN
4.0000 mg | Freq: Four times a day (QID) | INTRAMUSCULAR | Status: DC | PRN
  Administered 2017-10-25: 4 mg via INTRAVENOUS
  Filled 2017-10-16: qty 2

## 2017-10-16 MED ORDER — DIMETHYL FUMARATE 240 MG PO CPDR
240.0000 mg | DELAYED_RELEASE_CAPSULE | Freq: Two times a day (BID) | ORAL | Status: DC
  Administered 2017-10-20: 240 mg via ORAL
  Filled 2017-10-16: qty 1

## 2017-10-16 MED ORDER — ONDANSETRON HCL 4 MG PO TABS: 4.0000 mg | ORAL_TABLET | Freq: Four times a day (QID) | ORAL | Status: DC | PRN

## 2017-10-16 MED ORDER — LORAZEPAM 2 MG/ML IJ SOLN
1.0000 mg | Freq: Four times a day (QID) | INTRAMUSCULAR | Status: DC | PRN
  Administered 2017-10-17 (×3): 1 mg via INTRAVENOUS
  Filled 2017-10-16 (×3): qty 1

## 2017-10-16 MED ORDER — ADULT MULTIVITAMIN W/MINERALS CH
1.0000 | ORAL_TABLET | Freq: Every day | ORAL | Status: DC
  Administered 2017-10-16 – 2017-10-20 (×5): 1 via ORAL
  Filled 2017-10-16 (×6): qty 1

## 2017-10-16 MED ORDER — SODIUM CHLORIDE 0.9 % IV SOLN
INTRAVENOUS | Status: DC
  Administered 2017-10-16: 18:00:00 via INTRAVENOUS

## 2017-10-16 MED ORDER — FUROSEMIDE 10 MG/ML IJ SOLN
20.0000 mg | Freq: Once | INTRAMUSCULAR | Status: AC
  Administered 2017-10-16: 20 mg via INTRAVENOUS
  Filled 2017-10-16: qty 2

## 2017-10-16 MED ORDER — DEXTROSE-NACL 5-0.45 % IV SOLN
INTRAVENOUS | Status: DC
  Administered 2017-10-16 – 2017-10-18 (×3): via INTRAVENOUS

## 2017-10-16 NOTE — ED Notes (Signed)
Bed: JX91 Expected date:  Expected time:  Means of arrival:  Comments: EMS/hematemesis

## 2017-10-16 NOTE — ED Notes (Signed)
Attempted to give report. Will call back in 10 min. [phone]

## 2017-10-16 NOTE — H&P (Addendum)
History and Physical    [name] ZOX:[id number] DOB: [birth year]  DOA: 10/16/2017 PCP: Rinaldo Cloud, MD  Patient coming from: Home  Chief Complaint: Abdominal pain  HPI: [name] is a 62 y.o. male with medical history significant of MS, bipolar disorder, alcohol and tobacco abuse, presented to the emergency department complaining of abdominal pain with nausea and vomiting.  Patient is poor historian.  He reports to 2-3 days of abdominal pain associated with bloody nausea and vomiting.  Patient reported epigastric pain started approximately 2 weeks ago which has been worse with eating.  Denies diarrhea, constipation and fever or chills.  Last alcohol use 3 days ago.  Normally drinks occasionally  but can drink up to 12 pack a day.  Other associated symptoms include weakness/faint.  Denies NSAID/ASA use, no history of GI bleeding in the past.  Patient denies dark stools.  ED Course: Blood pressure systolic in the 90s, FOBT positive, hemoglobin 10.1 last on record was 15.  Creatinine 1.4, BUN 65.  Review of Systems:   General: no changes in body weight, no fever chills or decrease in energy.  HEENT: no blurry vision, hearing changes or sore throat Respiratory: no dyspnea, coughing, wheezing CV: no chest pain.  Positive for palpitation GI:  See HPI GU: no dysuria, burning on urination, increased urinary frequency, hematuria  Ext:. No deformities,  Neuro: no unilateral weakness, numbness, or tingling, no vision change or hearing loss.  Positive for lightheadedness  Skin: No rashes, lesions or wounds. MSK: No muscle spasm, no deformity, no limitation of range of movement in spin Heme: No easy bruising.  Travel history: No recent long distant travel.   Past Medical History:  Diagnosis Date  . Depression   . Mental disorder   . Movement disorder   . MS (multiple sclerosis) (HCC)   . Multiple sclerosis, relapsing-remitting (HCC)   . Neuromuscular disorder (HCC)   . Vision  abnormalities     Past Surgical History:  Procedure Laterality Date  . TONSILLECTOMY       reports that he has been smoking cigarettes.  He has a 108.00 pack-year smoking history. He has never used smokeless tobacco. He reports that he drinks alcohol. He reports that he has current or past drug history. Drug: Marijuana.  No Known Allergies  Family History  Problem Relation Age of Onset  . Heart disease Mother   . Stroke Father     Prior to Admission medications   Medication Sig Start Date End Date Taking? Authorizing Provider  ARIPiprazole (ABILIFY) 5 MG tablet Take by mouth. 09/29/16   [provider]  buPROPion (WELLBUTRIN XL) 300 MG 24 hr tablet Take 1 tablet (300 mg total) by mouth daily. For depression 09/25/12   Thermon Leyland, NP  Dimethyl Fumarate (TECFIDERA) 240 MG CPDR Take 1 capsule (240 mg total) by mouth 2 (two) times daily. 07/31/17   Sater, Pearletha Furl, MD  escitalopram (LEXAPRO) 20 MG tablet Take 1 tablet (20 mg total) by mouth daily. For depression 09/25/12   Thermon Leyland, NP  lithium carbonate (ESKALITH) 450 MG CR tablet Take 450 mg by mouth 2 (two) times daily. 04/04/16   [provider]    Physical Exam: Vitals:   10/16/17 1126 10/16/17 1140 10/16/17 1200 10/16/17 1309  BP: 101/74 119/79 122/77 137/85  Pulse: 89 87 79 89  Resp: 17 18 18  (!) 21  Temp:      TempSrc:      SpO2: 98% 100%  100% 100%     Constitutional: NAD, calm, comfortable Eyes: PERRL, lids and conjunctivae pale ENMT: Mucous membranes are dry. Posterior pharynx clear of any exudate or lesions.poor dentition.  Neck: normal, supple, no masses, no thyromegaly Respiratory: clear to auscultation bilaterally, no wheezing, no crackles.  Cardiovascular: Regular rate and rhythm, no murmurs / rubs / gallops. No extremity edema. 2+ pedal pulses.  Abdomen: no tenderness, no masses palpated. No hepatosplenomegaly. Bowel sounds positive.  Musculoskeletal: no clubbing / cyanosis. No joint  deformity upper and lower extremities. Good ROM Skin: no rashes, ulcers. No induration Neurologic: CN 2-12 grossly intact. Sensation intact, DTR normal. Strength 5/5 in all 4.  Psychiatric: Normal judgment and insight. Alert and oriented x 3..   Labs on Admission: I have personally reviewed following labs and imaging studies  CBC: Recent Labs  Lab 10/16/17 1130  WBC 9.8  HGB 10.1*  HCT 30.4*  MCV 98.7  PLT 298   Basic Metabolic Panel: Recent Labs  Lab 10/16/17 1130  NA 140  K 5.2*  CL 108  CO2 22  GLUCOSE 134*  BUN 65*  CREATININE 1.40*  CALCIUM 8.5*   GFR: CrCl cannot be calculated (Unknown ideal weight.). Liver Function Tests: Recent Labs  Lab 10/16/17 1130  AST 19  ALT 12  ALKPHOS 50  BILITOT 0.4  PROT 6.5  ALBUMIN 3.2*   No results for input(s): LIPASE, AMYLASE in the last 168 hours. No results for input(s): AMMONIA in the last 168 hours. Coagulation Profile: Recent Labs  Lab 10/16/17 1130  INR 1.14   Cardiac Enzymes: No results for input(s): CKTOTAL, CKMB, CKMBINDEX, TROPONINI in the last 168 hours. BNP (last 3 results) No results for input(s): PROBNP in the last 8760 hours. HbA1C: No results for input(s): HGBA1C in the last 72 hours. CBG: No results for input(s): GLUCAP in the last 168 hours. Lipid Profile: No results for input(s): CHOL, HDL, LDLCALC, TRIG, CHOLHDL, LDLDIRECT in the last 72 hours. Thyroid Function Tests: No results for input(s): TSH, T4TOTAL, FREET4, T3FREE, THYROIDAB in the last 72 hours. Anemia Panel: No results for input(s): VITAMINB12, FOLATE, FERRITIN, TIBC, IRON, RETICCTPCT in the last 72 hours. Urine analysis:    Component Value Date/Time   COLORURINE YELLOW 09/24/2016 0924   APPEARANCEUR CLEAR 09/24/2016 0924   LABSPEC 1.009 09/24/2016 0924   PHURINE 7.0 09/24/2016 0924   GLUCOSEU NEGATIVE 09/24/2016 0924   HGBUR NEGATIVE 09/24/2016 0924   BILIRUBINUR NEGATIVE 09/24/2016 0924   KETONESUR NEGATIVE 09/24/2016 0924    PROTEINUR NEGATIVE 09/24/2016 0924   UROBILINOGEN 0.2 07/08/2013 0930   NITRITE NEGATIVE 09/24/2016 0924   LEUKOCYTESUR NEGATIVE 09/24/2016 0924   Sepsis Labs: !!!!!!!!!!!!!!!!!!!!!!!!!!!!!!!!!!!!!!!!!!!! @LABRCNTIP (procalcitonin:4,lacticidven:4) )No results found for this or any previous visit (from the past 240 hour(s)).   Radiological Exams on Admission: No results found.  EKG: Ordered   Assessment/Plan Symptomatic anemia likely from GI bleed Given soft blood pressure, weakness and palpitations, EDP ordered blood transfusion.  Agree with 1 unit for now. Drop ~ 5 gram of hgb, last levels 15.0 in 03/2017.  Admit to stepdown, given soft blood pressure. CBC every 6 hours. Treat underlying causes. Will check anemia panel   GI bleed Likely upper, elevated BUN and creatinine.  FOBT positive. Agree with protonix ggt, GI consulted by EDP, may need EGD  Continue to monitor, supportive treatment   AKI with mild elevated K  Likely pre-renal  Continue IVF. Will give a dose of Lasix to help with potassium  Check BMP and Mag in  AM   MS  Stable, no flares in years Complaint with Tecfidera, follows with Dr. Epimenio Foot as outpatient   Bipolar disorder  Continue home meds.   Alcohol abuse  Will place on CIWA protocol   Tobacco abuse  Cessation discussed  Decline nicotine patch   DVT prophylaxis: SCD's  Code Status: Full Code  Family Communication: None  Disposition Plan: Anticipate discharge to previous home environment.  Consults called: GI called by EDP Deboraha Sprang  Admission status: SDU/Obs    Latrelle Dodrill MD [facility] Pager: Text Page via [email or web address]  [phone]  If 7PM-7AM, please contact night-coverage [email or web address] Password [email or web address]  10/16/2017, 1:23 PM

## 2017-10-16 NOTE — H&P (View-Only) (Signed)
Referring Provider:  Dr. Denton Lank Primary Care Physician:  Rinaldo Cloud, MD Primary Gastroenterologist: unassigned  Reason for Consultation:  GI bleed  HPI: [name] is a 62 y.o. male with past medical history of multiple sclerosis presented to the hospital with epigastric pain, nausea and vomiting of 2 days' duration.also had one episode of hematemesis followed by coffee-ground emesis yesterday.he was found to have anemia with hemoglobin of 10.1.GI is consulted for further evaluation.  Patient seen and examined and bedside. He mentioned episode of vomiting red color blood in the before yesterday and had another vomiting of black color material yesterday. He denies any bowel movement in last 2-3 days. Not sure of any dark colored stool. Denies any bright red blood per rectum.drinks around 12 packs of beer every day. Denies any dysphagia or odynophagia. Complaining of intermittent acid reflux and nausea.  Reports having 2 colonoscopies in the past which were normal with last one being around 3 years ago. I do not have any records available to review.no family history of colon cancer. Denies NSAID use.    Past Medical History:  Diagnosis Date  . Depression   . Mental disorder   . Movement disorder   . MS (multiple sclerosis) (HCC)   . Multiple sclerosis, relapsing-remitting (HCC)   . Neuromuscular disorder (HCC)   . Vision abnormalities     Past Surgical History:  Procedure Laterality Date  . TONSILLECTOMY      Prior to Admission medications   Medication Sig Start Date End Date Taking? Authorizing Provider  ARIPiprazole (ABILIFY) 5 MG tablet Take by mouth. 09/29/16   [provider]  buPROPion (WELLBUTRIN XL) 300 MG 24 hr tablet Take 1 tablet (300 mg total) by mouth daily. For depression 09/25/12   Thermon Leyland, NP  Dimethyl Fumarate (TECFIDERA) 240 MG CPDR Take 1 capsule (240 mg total) by mouth 2 (two) times daily. 07/31/17   Sater, Pearletha Furl, MD  escitalopram (LEXAPRO) 20  MG tablet Take 1 tablet (20 mg total) by mouth daily. For depression 09/25/12   Thermon Leyland, NP  lithium carbonate (ESKALITH) 450 MG CR tablet Take 450 mg by mouth 2 (two) times daily. 04/04/16   [provider]    Scheduled Meds: Continuous Infusions: . sodium chloride    . pantoprozole (PROTONIX) infusion 8 mg/hr (10/16/17 1217)   PRN Meds:.  Allergies as of 10/16/2017  . (No Known Allergies)    Family History  Problem Relation Age of Onset  . Heart disease Mother   . Stroke Father     Social History   Socioeconomic History  . Marital status: Legally Separated    Spouse name: Not on file  . Number of children: Not on file  . Years of education: Not on file  . Highest education level: Not on file  Occupational History  . Not on file  Social Needs  . Financial resource strain: Not on file  . Food insecurity:    Worry: Not on file    Inability: Not on file  . Transportation needs:    Medical: Not on file    Non-medical: Not on file  Tobacco Use  . Smoking status: Current Every Day Smoker    Packs/day: 3.00    Years: 36.00    Pack years: 108.00    Types: Cigarettes  . Smokeless tobacco: Never Used  Substance and Sexual Activity  . Alcohol use: Yes    Alcohol/week: 0.0 oz    Comment: 2-3 times a  week.   . Drug use: Yes    Types: Marijuana    Comment: 1-2 times a month  . Sexual activity: Not Currently    Birth control/protection: None  Lifestyle  . Physical activity:    Days per week: Not on file    Minutes per session: Not on file  . Stress: Not on file  Relationships  . Social connections:    Talks on phone: Not on file    Gets together: Not on file    Attends religious service: Not on file    Active member of club or organization: Not on file    Attends meetings of clubs or organizations: Not on file    Relationship status: Not on file  . Intimate partner violence:    Fear of current or ex partner: Not on file    Emotionally abused: Not  on file    Physically abused: Not on file    Forced sexual activity: Not on file  Other Topics Concern  . Not on file  Social History Narrative   ** Merged History Encounter **        Review of Systems: Review of Systems  Constitutional: Positive for malaise/fatigue. Negative for chills and fever.  HENT: Negative for hearing loss and tinnitus.   Eyes: Negative for blurred vision and double vision.  Respiratory: Negative for cough and hemoptysis.   Cardiovascular: Negative for chest pain and palpitations.  Gastrointestinal: Positive for abdominal pain, heartburn, nausea and vomiting. Negative for constipation and diarrhea.  Genitourinary: Negative for dysuria and urgency.  Musculoskeletal: Positive for back pain. Negative for myalgias.  Skin: Negative for itching and rash.  Neurological: Negative for seizures and loss of consciousness.  Endo/Heme/Allergies: Does not bruise/bleed easily.  Psychiatric/Behavioral: Negative for hallucinations and suicidal ideas.    Physical Exam: Vital signs: Vitals:   10/16/17 1200 10/16/17 1309  BP: 122/77 137/85  Pulse: 79 89  Resp: 18 (!) 21  Temp:    SpO2: 100% 100%     Physical Exam  Constitutional: He is oriented to person, place, and time. He appears well-developed and well-nourished. No distress.  HENT:  Head: Normocephalic and atraumatic.  Mouth/Throat: Oropharynx is clear and moist. No oropharyngeal exudate.  Eyes: EOM are normal. No scleral icterus.  Neck: Normal range of motion. Neck supple. No thyromegaly present.  Cardiovascular: Normal rate, regular rhythm and normal heart sounds.  Pulmonary/Chest: Effort normal and breath sounds normal.  Abdominal: Soft. Bowel sounds are normal. He exhibits no distension. There is no tenderness. There is no rebound and no guarding.  Musculoskeletal: Normal range of motion. He exhibits no edema.  Neurological: He is alert and oriented to person, place, and time.  Skin: Skin is dry. No  erythema.  Psychiatric: He has a normal mood and affect. Judgment normal.  Vitals reviewed.   GI:  Lab Results: Recent Labs    10/16/17 1130  WBC 9.8  HGB 10.1*  HCT 30.4*  PLT 298   BMET Recent Labs    10/16/17 1130  NA 140  K 5.2*  CL 108  CO2 22  GLUCOSE 134*  BUN 65*  CREATININE 1.40*  CALCIUM 8.5*   LFT Recent Labs    10/16/17 1130  PROT 6.5  ALBUMIN 3.2*  AST 19  ALT 12  ALKPHOS 50  BILITOT 0.4   PT/INR Recent Labs    10/16/17 1130  LABPROT 14.5  INR 1.14     Studies/Results: No results found.  Impression/Plan: - hematemesis  and coffee-ground emesis. No significant NSAID use but ongoing alcohol use. Most likely esophagitis/gastritis vs ulcer disease. Normal LFTs. Normal INR. Normal platelet counts. Less likely to be variceal bleeding. - Acute blood loss anemia.  Recommendations -------------------------- - Continue Protonix. Monitor H&H. Okay to have a full liquid diet today. Nothing by mouth past midnight for EGD tomorrow.  Risks (bleeding, infection, bowel perforation that could require surgery, sedation-related changes in cardiopulmonary systems), benefits (identification and possible treatment of source of symptoms, exclusion of certain causes of symptoms), and alternatives (watchful waiting, radiographic imaging studies, empiric medical treatment)  were explained to patient in detail and patient wishes to proceed.    LOS: 0 days   Kathi Der  MD, FACP 10/16/2017, 1:20 PM  Contact #  [phone]

## 2017-10-16 NOTE — ED Notes (Signed)
Attempted to give report to [id number]. Line was busy. Secretary will have RN call me back.

## 2017-10-16 NOTE — ED Triage Notes (Signed)
Patient arrived via [facility] from home. Patient c/o n/v and abdominal pain x3days. This morning patient vomited blood then then turned to ground coffee. Slight dizziness with standing up. Patient fell yesterday and this morning trying to go to the bathroom. Abrasion on left arm. No neck or back pain. A/Ox4. Orthostatic- 80 palpated standing up. BP-102/66, p-95, 96% RA. Patient told ems he has attempted suicide in the past. Patient denied SI today per ems.

## 2017-10-16 NOTE — ED Notes (Signed)
ED TO INPATIENT HANDOFF REPORT  Name/Age/Gender [name] 62 y.o. male  Code Status Code Status History    Date Active Date Inactive Code Status Order ID Comments User Context   09/24/2016 0234 09/24/2016 2123 Full Code [id number]  Rolland Porter, MD ED   04/24/2016 2301 04/25/2016 1918 Full Code [id number]  Davonna Belling, MD ED   07/16/2015 0039 07/16/2015 1624 Full Code [id number]  Virgel Manifold, MD ED   07/06/2013 2136 07/07/2013 1611 Full Code [id number]  Renold Genta, PA-C ED   12/16/2012 1726 12/17/2012 2106 Full Code [id number]  Renold Genta, PA-C ED   10/09/2012 1846 10/10/2012 1439 Full Code [id number]  Linus Mako, PA-C ED   09/07/2012 1223 09/08/2012 2130 Full Code [id number]  Elwyn Lade, PA-C ED   08/01/2012 1733 08/02/2012 0528 Full Code [id number]  Linus Mako, PA-C ED   07/27/2012 1140 07/28/2012 1951 Full Code [id number]  Ezequiel Essex, MD ED   02/15/2012 1617 02/17/2012 2354 Full Code [id number]  Leota Jacobsen, MD ED   11/20/2011 0221 11/23/2011 1627 Full Code [id number]  Johnna Acosta, MD ED   10/28/2011 2031 11/02/2011 2220 Full Code [id number]  Brand Males, MD ED   10/05/2011 1225 10/06/2011 0630 Full Code [id number]  Linus Mako, [place] ED   05/04/2011 1748 05/05/2011 0733 Full Code [id number]  Hosmer, Violet Baldy, MD ED      Home/SNF/Other Home  Chief Complaint Hematemesis  Level of Care/Admitting Diagnosis ED Disposition    ED Disposition Condition Comment   [place]: [facility], [place] [[id number]]  Level of Care: Stepdown [14]  Admit to SDU based on following criteria: Severe physiological/psychological symptoms:  Any diagnosis requiring assessment & intervention at least every 4 hours on an ongoing basis to obtain desired patient outcomes including stability and rehabilitation  Diagnosis: GI bleed [078675]  Admitting Physician: Patrecia Pour, EDWIN [[id number]]  Attending Physician: Patrecia Pour, EDWIN [[id number]]  PT Class (Do Not  Modify): Observation [104]  PT Acc Code (Do Not Modify): Observation [10022]       Medical History Past Medical History:  Diagnosis Date  . Depression   . Mental disorder   . Movement disorder   . MS (multiple sclerosis) ([place])   . Multiple sclerosis, relapsing-remitting ([place])   . Neuromuscular disorder ([place])   . Vision abnormalities     Allergies No Known Allergies  IV Location/Drains/Wounds Patient Lines/Drains/Airways Status   Active Line/Drains/Airways    Name:   Placement date:   Placement time:   Site:   Days:   Peripheral IV 10/16/17 Left Hand   10/16/17    1139    Hand   less than 1   Peripheral IV 10/16/17 Left Forearm   10/16/17    1309    Forearm   less than 1          Labs/Imaging Results for orders placed or performed during the hospital encounter of 10/16/17 (from the past 24 hour(s))  POC occult blood, ED Provider will collect     Status: Abnormal   Collection Time: 10/16/17 11:13 AM  Result Value Ref Range   Fecal Occult Bld POSITIVE (A) NEGATIVE  ABO/Rh     Status: None   Collection Time: 10/16/17 11:29 AM  Result Value Ref Range   ABO/RH(D)      A POS Performed at [facility], [place] [address] [place], [place] [address]   CBC     Status: Abnormal  Collection Time: 10/16/17 11:30 AM  Result Value Ref Range   WBC 9.8 4.0 - 10.5 K/uL   RBC 3.08 (L) 4.22 - 5.81 MIL/uL   Hemoglobin 10.1 (L) 13.0 - 17.0 g/dL   HCT 30.4 (L) 39.0 - 52.0 %   MCV 98.7 78.0 - 100.0 fL   MCH 32.8 26.0 - 34.0 pg   MCHC 33.2 30.0 - 36.0 g/dL   RDW 13.1 11.5 - 15.5 %   Platelets 298 150 - 400 K/uL    Comment: Performed at [facility], [place] [address] [place], [place] [address]  Comprehensive metabolic panel     Status: Abnormal   Collection Time: 10/16/17 11:30 AM  Result Value Ref Range   Sodium 140 135 - 145 mmol/L   Potassium 5.2 (H) 3.5 - 5.1 mmol/L   Chloride 108 98 - 111 mmol/L    Comment: Please note change in reference  range.   CO2 22 22 - 32 mmol/L   Glucose, Bld 134 (H) 70 - 99 mg/dL    Comment: Please note change in reference range.   BUN 65 (H) 8 - 23 mg/dL    Comment: Please note change in reference range.   Creatinine, Ser 1.40 (H) 0.61 - 1.24 mg/dL   Calcium 8.5 (L) 8.9 - 10.3 mg/dL   Total Protein 6.5 6.5 - 8.1 g/dL   Albumin 3.2 (L) 3.5 - 5.0 g/dL   AST 19 15 - 41 U/L   ALT 12 0 - 44 U/L    Comment: Please note change in reference range.   Alkaline Phosphatase 50 38 - 126 U/L   Total Bilirubin 0.4 0.3 - 1.2 mg/dL   GFR calc non Af Amer 52 (L) >60 mL/min   GFR calc Af Amer >60 >60 mL/min    Comment: (NOTE) The eGFR has been calculated using the CKD EPI equation. This calculation has not been validated in all clinical situations. eGFR's persistently <60 mL/min signify possible Chronic Kidney Disease.    Anion gap 10 5 - 15    Comment: Performed at [facility], [place] [address] [place], [place] [address]  Type and screen     Status: None (Preliminary result)   Collection Time: 10/16/17 11:30 AM  Result Value Ref Range   ABO/RH(D) A POS    Antibody Screen NEG    Sample Expiration      10/19/2017 Performed at [facility], [place] [address], [place], [place] [address]    Unit Number [id number]    Blood Component Type RED CELLS,LR    Unit division 00    Status of Unit ALLOCATED    Transfusion Status OK TO TRANSFUSE    Crossmatch Result Compatible    Unit Number [id number]    Blood Component Type RED CELLS,LR    Unit division 00    Status of Unit ALLOCATED    Transfusion Status OK TO TRANSFUSE    Crossmatch Result Compatible   Protime-INR     Status: None   Collection Time: 10/16/17 11:30 AM  Result Value Ref Range   Prothrombin Time 14.5 11.4 - 15.2 seconds   INR 1.14     Comment: Performed at [facility], [place] [address] [place], [place] [address]  Prepare RBC     Status: None   Collection Time: 10/16/17  1:00 PM   Result Value Ref Range   Order Confirmation      ORDER PROCESSED BY BLOOD BANK Performed at  [facility], [place] [address] [place], [place] [address]    No results found.  Pending Labs Unresulted Labs (From admission, onward)   None      Vitals/Pain Today's Vitals   10/16/17 1111 10/16/17 1126 10/16/17 1140 10/16/17 1200  BP: (!) 203/174 101/74 119/79 122/77  Pulse: 86 89 87 79  Resp: _0 Temp: 97.6 F (36.4 C)     TempSrc: Oral     SpO2: 96% 98% 100% 100%    Isolation Precautions No active isolations  Medications Medications  pantoprazole (PROTONIX) 80 mg in sodium chloride 0.9 % 250 mL (0.32 mg/mL) infusion (8 mg/hr Intravenous New Bag/Given 10/16/17 1217)  0.9 %  sodium chloride infusion (has no administration in time range)  sodium chloride 0.9 % bolus 1,000 mL (0 mLs Intravenous Stopped 10/16/17 1255)  pantoprazole (PROTONIX) 80 mg in sodium chloride 0.9 % 100 mL IVPB (0 mg Intravenous Stopped 10/16/17 1216)    Mobility walks with person assist

## 2017-10-16 NOTE — ED Provider Notes (Signed)
[facility]-EMERGENCY DEPT Provider Note   CSN: [id number] Arrival date & time: 10/16/17  1054     History   Chief Complaint Chief Complaint  Patient presents with  . Abdominal Pain  . Nausea    HPI [name] is a 62 y.o. male.  Patient c/o vomiting blood in the past 2-3 days. Symptoms episodic, persistent, moderate. States started with epigastric pain approx 2 weeks ago, mild, persistent, at times worse w eating. Denies diarrhea or constipation. No fever or chills. No hx pud, varcies. Hx etoh abuse, denies recent. Pt denies dark stools. Denies hx gi bleeding. Denies anticoag use. Denies nsaid/asa use. Today feels weak/faint when stands. Ems notes bp mildly low.   The history is provided by the patient and the EMS personnel.  Abdominal Pain   Associated symptoms include vomiting. Pertinent negatives include fever and headaches.    Past Medical History:  Diagnosis Date  . Depression   . Mental disorder   . Movement disorder   . MS (multiple sclerosis) (HCC)   . Multiple sclerosis, relapsing-remitting (HCC)   . Neuromuscular disorder (HCC)   . Vision abnormalities     Patient Active Problem List   Diagnosis Date Noted  . Coccygeal pain, acute 04/08/2017  . Elevated TSH 04/06/2016  . Bipolar I disorder (HCC) 04/05/2016  . 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 Numbness 01/16/2016  . Alcohol abuse 07/16/2015  . 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 Other fatigue 06/29/2014  . Gait disturbance 06/29/2014  . Urinary urgency 06/29/2014  . High risk medication use 06/29/2014  . Bipolar affective disorder, depressed, severe (HCC) 09/18/2012    Class: Chronic  . Multiple sclerosis (HCC) 10/31/2011    Class: Chronic  . Dysthymia 05/11/2011    Past Surgical History:  Procedure Laterality Date  . TONSILLECTOMY          Home Medications    Prior to Admission medications   Medication Sig Start Date End Date  Taking? Authorizing Provider  ARIPiprazole (ABILIFY) 5 MG tablet Take by mouth. 09/29/16   [provider]  buPROPion (WELLBUTRIN XL) 300 MG 24 hr tablet Take 1 tablet (300 mg total) by mouth daily. For depression 09/25/12   Thermon Leyland, NP  Dimethyl Fumarate (TECFIDERA) 240 MG CPDR Take 1 capsule (240 mg total) by mouth 2 (two) times daily. 07/31/17   Sater, Pearletha Furl, MD  escitalopram (LEXAPRO) 20 MG tablet Take 1 tablet (20 mg total) by mouth daily. For depression 09/25/12   Thermon Leyland, NP  lithium carbonate (ESKALITH) 450 MG CR tablet Take 450 mg by mouth 2 (two) times daily. 04/04/16   [provider]    Family History Family History  Problem Relation Age of Onset  . Heart disease Mother   . Stroke Father     Social History Social History   Tobacco Use  . Smoking status: Current Every Day Smoker    Packs/day: 3.00    Years: 36.00    Pack years: 108.00    Types: Cigarettes  . Smokeless tobacco: Never Used  Substance Use Topics  . Alcohol use: Yes    Alcohol/week: 0.0 oz    Comment: 2-3 times a week.   . Drug use: Yes    Types: Marijuana    Comment: 1-2 times a month     Allergies   Patient has no known allergies.   Review of Systems Review of Systems  Constitutional: Negative for fever.  HENT: Negative for sore throat.   Eyes: Negative for redness.  Respiratory: Negative for shortness of breath.   Cardiovascular: Negative for chest pain.  Gastrointestinal: Positive for abdominal pain and vomiting.  Genitourinary: Negative for flank pain.  Musculoskeletal: Negative for back pain and neck pain.  Skin: Negative for rash.  Neurological: Positive for light-headedness. Negative for headaches.  Hematological: Does not bruise/bleed easily.  Psychiatric/Behavioral: Negative for confusion.     Physical Exam Updated Vital Signs BP 101/74   Pulse 89   Temp 97.6 F (36.4 C) (Oral)   Resp 17   SpO2 98%   Physical Exam  Constitutional: He  appears well-developed and well-nourished.  Pale.   HENT:  Mouth/Throat: Oropharynx is clear and moist.  Eyes: Conjunctivae are normal.  Neck: Neck supple. No tracheal deviation present.  Cardiovascular: Normal rate, regular rhythm, normal heart sounds and intact distal pulses. Exam reveals no gallop and no friction rub.  No murmur heard. Pulmonary/Chest: Effort normal and breath sounds normal. No accessory muscle usage. No respiratory distress.  Abdominal: Soft. Bowel sounds are normal. He exhibits no distension and no mass. There is no tenderness. There is no rebound and no guarding. No hernia.  Genitourinary:  Genitourinary Comments: V dark maroon/black stool, heme pos.   Musculoskeletal: He exhibits no edema.  Neurological: He is alert.  Skin: Skin is warm and dry. He is not diaphoretic.  Psychiatric: He has a normal mood and affect.  Nursing note and vitals reviewed.    ED Treatments / Results  Labs (all labs ordered are listed, but only abnormal results are displayed) Results for orders placed or performed during the hospital encounter of 10/16/17  CBC  Result Value Ref Range   WBC 9.8 4.0 - 10.5 K/uL   RBC 3.08 (L) 4.22 - 5.81 MIL/uL   Hemoglobin 10.1 (L) 13.0 - 17.0 g/dL   HCT 16.1 (L) 09.6 - 04.5 %   MCV 98.7 78.0 - 100.0 fL   MCH 32.8 26.0 - 34.0 pg   MCHC 33.2 30.0 - 36.0 g/dL   RDW 40.9 81.1 - 91.4 %   Platelets 298 150 - 400 K/uL  POC occult blood, ED Provider will collect  Result Value Ref Range   Fecal Occult Bld POSITIVE (A) NEGATIVE    EKG None  Radiology No results found.  Procedures Procedures (including critical care time)  Medications Ordered in ED Medications  sodium chloride 0.9 % bolus 1,000 mL (1,000 mLs Intravenous New Bag/Given 10/16/17 1139)  pantoprazole (PROTONIX) 80 mg in sodium chloride 0.9 % 100 mL IVPB (has no administration in time range)  pantoprazole (PROTONIX) 80 mg in sodium chloride 0.9 % 250 mL (0.32 mg/mL) infusion (has no  administration in time range)     Initial Impression / Assessment and Plan / ED Course  I have reviewed the triage vital signs and the nursing notes.  Pertinent labs & imaging results that were available during my care of the patient were reviewed by me and considered in my medical decision making (see chart for details).  Iv ns. Continuous pulse ox and monitor.   2nd iv line.   Ns bolus.   Labs sent.   protonix iv bolus/gtt.   GI consulted.   Reviewed nursing notes and prior charts for additional history.   Hospitalists consulted for admission.   Given 5 gm drop in hgb from prior, soft bp, black stool/upper gi bleeding, will emergently transfuse prbc.  Discussed w gi doctor on call([facility]) - they  will see in consult.  CRITICAL CARE Acute upper gi bleeding, soft bp, presyncope, emergent transfusion.  Performed by: Suzi Roots Total critical care time: 40 minutes Critical care time was exclusive of separately billable procedures and treating other patients. Critical care was necessary to treat or prevent imminent or life-threatening deterioration. Critical care was time spent personally by me on the following activities: development of treatment plan with patient and/or surrogate as well as nursing, discussions with consultants, evaluation of patient's response to treatment, examination of patient, obtaining history from patient or surrogate, ordering and performing treatments and interventions, ordering and review of laboratory studies, ordering and review of radiographic studies, pulse oximetry and re-evaluation of patient's condition.   Final Clinical Impressions(s) / ED Diagnoses   Final diagnoses:  None    ED Discharge Orders    None       Cathren Laine, MD 10/16/17 1220

## 2017-10-16 NOTE — ED Notes (Signed)
Electronic blood consent form signed in computer.

## 2017-10-16 NOTE — Consult Note (Signed)
Referring Provider:  Dr. Denton Lank Primary Care Physician:  Rinaldo Cloud, MD Primary Gastroenterologist: unassigned  Reason for Consultation:  GI bleed  HPI: [name] is a 62 y.o. male with past medical history of multiple sclerosis presented to the hospital with epigastric pain, nausea and vomiting of 2 days' duration.also had one episode of hematemesis followed by coffee-ground emesis yesterday.he was found to have anemia with hemoglobin of 10.1.GI is consulted for further evaluation.  Patient seen and examined and bedside. He mentioned episode of vomiting red color blood in the before yesterday and had another vomiting of black color material yesterday. He denies any bowel movement in last 2-3 days. Not sure of any dark colored stool. Denies any bright red blood per rectum.drinks around 12 packs of beer every day. Denies any dysphagia or odynophagia. Complaining of intermittent acid reflux and nausea.  Reports having 2 colonoscopies in the past which were normal with last one being around 3 years ago. I do not have any records available to review.no family history of colon cancer. Denies NSAID use.    Past Medical History:  Diagnosis Date  . Depression   . Mental disorder   . Movement disorder   . MS (multiple sclerosis) (HCC)   . Multiple sclerosis, relapsing-remitting (HCC)   . Neuromuscular disorder (HCC)   . Vision abnormalities     Past Surgical History:  Procedure Laterality Date  . TONSILLECTOMY      Prior to Admission medications   Medication Sig Start Date End Date Taking? Authorizing Provider  ARIPiprazole (ABILIFY) 5 MG tablet Take by mouth. 09/29/16   [provider]  buPROPion (WELLBUTRIN XL) 300 MG 24 hr tablet Take 1 tablet (300 mg total) by mouth daily. For depression 09/25/12   Thermon Leyland, NP  Dimethyl Fumarate (TECFIDERA) 240 MG CPDR Take 1 capsule (240 mg total) by mouth 2 (two) times daily. 07/31/17   Sater, Pearletha Furl, MD  escitalopram (LEXAPRO) 20  MG tablet Take 1 tablet (20 mg total) by mouth daily. For depression 09/25/12   Thermon Leyland, NP  lithium carbonate (ESKALITH) 450 MG CR tablet Take 450 mg by mouth 2 (two) times daily. 04/04/16   [provider]    Scheduled Meds: Continuous Infusions: . sodium chloride    . pantoprozole (PROTONIX) infusion 8 mg/hr (10/16/17 1217)   PRN Meds:.  Allergies as of 10/16/2017  . (No Known Allergies)    Family History  Problem Relation Age of Onset  . Heart disease Mother   . Stroke Father     Social History   Socioeconomic History  . Marital status: Legally Separated    Spouse name: Not on file  . Number of children: Not on file  . Years of education: Not on file  . Highest education level: Not on file  Occupational History  . Not on file  Social Needs  . Financial resource strain: Not on file  . Food insecurity:    Worry: Not on file    Inability: Not on file  . Transportation needs:    Medical: Not on file    Non-medical: Not on file  Tobacco Use  . Smoking status: Current Every Day Smoker    Packs/day: 3.00    Years: 36.00    Pack years: 108.00    Types: Cigarettes  . Smokeless tobacco: Never Used  Substance and Sexual Activity  . Alcohol use: Yes    Alcohol/week: 0.0 oz    Comment: 2-3 times a  week.   . Drug use: Yes    Types: Marijuana    Comment: 1-2 times a month  . Sexual activity: Not Currently    Birth control/protection: None  Lifestyle  . Physical activity:    Days per week: Not on file    Minutes per session: Not on file  . Stress: Not on file  Relationships  . Social connections:    Talks on phone: Not on file    Gets together: Not on file    Attends religious service: Not on file    Active member of club or organization: Not on file    Attends meetings of clubs or organizations: Not on file    Relationship status: Not on file  . Intimate partner violence:    Fear of current or ex partner: Not on file    Emotionally abused: Not  on file    Physically abused: Not on file    Forced sexual activity: Not on file  Other Topics Concern  . Not on file  Social History Narrative   ** Merged History Encounter **        Review of Systems: Review of Systems  Constitutional: Positive for malaise/fatigue. Negative for chills and fever.  HENT: Negative for hearing loss and tinnitus.   Eyes: Negative for blurred vision and double vision.  Respiratory: Negative for cough and hemoptysis.   Cardiovascular: Negative for chest pain and palpitations.  Gastrointestinal: Positive for abdominal pain, heartburn, nausea and vomiting. Negative for constipation and diarrhea.  Genitourinary: Negative for dysuria and urgency.  Musculoskeletal: Positive for back pain. Negative for myalgias.  Skin: Negative for itching and rash.  Neurological: Negative for seizures and loss of consciousness.  Endo/Heme/Allergies: Does not bruise/bleed easily.  Psychiatric/Behavioral: Negative for hallucinations and suicidal ideas.    Physical Exam: Vital signs: Vitals:   10/16/17 1200 10/16/17 1309  BP: 122/77 137/85  Pulse: 79 89  Resp: 18 (!) 21  Temp:    SpO2: 100% 100%     Physical Exam  Constitutional: He is oriented to person, place, and time. He appears well-developed and well-nourished. No distress.  HENT:  Head: Normocephalic and atraumatic.  Mouth/Throat: Oropharynx is clear and moist. No oropharyngeal exudate.  Eyes: EOM are normal. No scleral icterus.  Neck: Normal range of motion. Neck supple. No thyromegaly present.  Cardiovascular: Normal rate, regular rhythm and normal heart sounds.  Pulmonary/Chest: Effort normal and breath sounds normal.  Abdominal: Soft. Bowel sounds are normal. He exhibits no distension. There is no tenderness. There is no rebound and no guarding.  Musculoskeletal: Normal range of motion. He exhibits no edema.  Neurological: He is alert and oriented to person, place, and time.  Skin: Skin is dry. No  erythema.  Psychiatric: He has a normal mood and affect. Judgment normal.  Vitals reviewed.   GI:  Lab Results: Recent Labs    10/16/17 1130  WBC 9.8  HGB 10.1*  HCT 30.4*  PLT 298   BMET Recent Labs    10/16/17 1130  NA 140  K 5.2*  CL 108  CO2 22  GLUCOSE 134*  BUN 65*  CREATININE 1.40*  CALCIUM 8.5*   LFT Recent Labs    10/16/17 1130  PROT 6.5  ALBUMIN 3.2*  AST 19  ALT 12  ALKPHOS 50  BILITOT 0.4   PT/INR Recent Labs    10/16/17 1130  LABPROT 14.5  INR 1.14     Studies/Results: No results found.  Impression/Plan: - hematemesis  and coffee-ground emesis. No significant NSAID use but ongoing alcohol use. Most likely esophagitis/gastritis vs ulcer disease. Normal LFTs. Normal INR. Normal platelet counts. Less likely to be variceal bleeding. - Acute blood loss anemia.  Recommendations -------------------------- - Continue Protonix. Monitor H&H. Okay to have a full liquid diet today. Nothing by mouth past midnight for EGD tomorrow.  Risks (bleeding, infection, bowel perforation that could require surgery, sedation-related changes in cardiopulmonary systems), benefits (identification and possible treatment of source of symptoms, exclusion of certain causes of symptoms), and alternatives (watchful waiting, radiographic imaging studies, empiric medical treatment)  were explained to patient in detail and patient wishes to proceed.    LOS: 0 days   Kathi Der  MD, FACP 10/16/2017, 1:20 PM  Contact #  [phone]

## 2017-10-17 ENCOUNTER — Encounter (HOSPITAL_COMMUNITY): Payer: Self-pay | Admitting: *Deleted

## 2017-10-17 ENCOUNTER — Observation Stay (HOSPITAL_COMMUNITY): Payer: Medicare HMO | Admitting: Anesthesiology

## 2017-10-17 ENCOUNTER — Ambulatory Visit: Payer: Medicare HMO | Admitting: Neurology

## 2017-10-17 ENCOUNTER — Encounter (HOSPITAL_COMMUNITY)
Admission: EM | Disposition: A | Discharge status: Skilled Nursing Facility | Payer: Self-pay | Source: Home / Self Care | Attending: Internal Medicine

## 2017-10-17 DIAGNOSIS — E875 Hyperkalemia: Secondary | ICD-10-CM | POA: Diagnosis not present

## 2017-10-17 DIAGNOSIS — F319 Bipolar disorder, unspecified: Secondary | ICD-10-CM

## 2017-10-17 DIAGNOSIS — N179 Acute kidney failure, unspecified: Secondary | ICD-10-CM | POA: Diagnosis not present

## 2017-10-17 DIAGNOSIS — G35 Multiple sclerosis: Secondary | ICD-10-CM | POA: Diagnosis not present

## 2017-10-17 DIAGNOSIS — F1010 Alcohol abuse, uncomplicated: Secondary | ICD-10-CM | POA: Diagnosis not present

## 2017-10-17 DIAGNOSIS — R410 Disorientation, unspecified: Secondary | ICD-10-CM | POA: Diagnosis not present

## 2017-10-17 DIAGNOSIS — K922 Gastrointestinal hemorrhage, unspecified: Secondary | ICD-10-CM | POA: Diagnosis not present

## 2017-10-17 DIAGNOSIS — K2950 Unspecified chronic gastritis without bleeding: Secondary | ICD-10-CM | POA: Diagnosis not present

## 2017-10-17 DIAGNOSIS — D649 Anemia, unspecified: Secondary | ICD-10-CM | POA: Diagnosis not present

## 2017-10-17 HISTORY — PX: ESOPHAGOGASTRODUODENOSCOPY (EGD) WITH PROPOFOL: SHX5813

## 2017-10-17 HISTORY — PX: BIOPSY: SHX5522

## 2017-10-17 LAB — BASIC METABOLIC PANEL
Anion gap: 7 (ref 5–15)
BUN: 40 mg/dL — ABNORMAL HIGH (ref 8–23)
CO2: 21 mmol/L — ABNORMAL LOW (ref 22–32)
Calcium: 8 mg/dL — ABNORMAL LOW (ref 8.9–10.3)
Chloride: 109 mmol/L (ref 98–111)
Creatinine, Ser: 1.31 mg/dL — ABNORMAL HIGH (ref 0.61–1.24)
GFR calc Af Amer: >60 mL/min (ref >60)
GFR calc non Af Amer: 57 mL/min — ABNORMAL LOW (ref >60)
Glucose, Bld: 106 mg/dL — ABNORMAL HIGH (ref 70–99)
Potassium: 3.8 mmol/L (ref 3.5–5.1)
Sodium: 137 mmol/L (ref 135–145)

## 2017-10-17 LAB — RETICULOCYTES
RBC.: 2.68 MIL/uL — ABNORMAL LOW (ref 4.22–5.81)
Retic Count, Absolute: 50.9 10*3/uL (ref 19.0–186.0)
Retic Ct Pct: 1.9 % (ref 0.4–3.1)

## 2017-10-17 LAB — IRON AND TIBC
Iron: 77 ug/dL (ref 45–182)
Saturation Ratios: 36 % (ref 17.9–39.5)
TIBC: 213 ug/dL — ABNORMAL LOW (ref 250–450)
UIBC: 136 ug/dL

## 2017-10-17 LAB — CBC
HCT: 25.7 % — ABNORMAL LOW (ref 39.0–52.0)
HCT: 24.6 % — ABNORMAL LOW (ref 39.0–52.0)
Hemoglobin: 8.6 g/dL — ABNORMAL LOW (ref 13.0–17.0)
Hemoglobin: 8.2 g/dL — ABNORMAL LOW (ref 13.0–17.0)
MCH: 32 pg (ref 26.0–34.0)
MCH: 32.3 pg (ref 26.0–34.0)
MCHC: 33.5 g/dL (ref 30.0–36.0)
MCHC: 33.3 g/dL (ref 30.0–36.0)
MCV: 95.5 fL (ref 78.0–100.0)
MCV: 96.9 fL (ref 78.0–100.0)
Platelets: 195 10*3/uL (ref 150–400)
Platelets: 210 10*3/uL (ref 150–400)
RBC: 2.69 MIL/uL — ABNORMAL LOW (ref 4.22–5.81)
RBC: 2.54 MIL/uL — ABNORMAL LOW (ref 4.22–5.81)
RDW: 15.4 % (ref 11.5–15.5)
RDW: 15.3 % (ref 11.5–15.5)
WBC: 6 10*3/uL (ref 4.0–10.5)
WBC: 4.7 10*3/uL (ref 4.0–10.5)

## 2017-10-17 LAB — HIV ANTIBODY (ROUTINE TESTING W REFLEX): HIV Screen 4th Generation wRfx: NONREACTIVE

## 2017-10-17 LAB — MAGNESIUM: Magnesium: 2.1 mg/dL (ref 1.7–2.4)

## 2017-10-17 LAB — VITAMIN B12: Vitamin B-12: 180 pg/mL (ref 180–914)

## 2017-10-17 LAB — FOLATE: Folate: 11.4 ng/mL (ref >5.9)

## 2017-10-17 LAB — FERRITIN: Ferritin: 122 ng/mL (ref 24–336)

## 2017-10-17 SURGERY — ESOPHAGOGASTRODUODENOSCOPY (EGD) WITH PROPOFOL
Anesthesia: Monitor Anesthesia Care

## 2017-10-17 MED ORDER — LORAZEPAM 2 MG/ML IJ SOLN
INTRAMUSCULAR | Status: AC
  Filled 2017-10-17: qty 1

## 2017-10-17 MED ORDER — LORAZEPAM 2 MG/ML IJ SOLN
2.0000 mg | INTRAMUSCULAR | Status: DC | PRN
  Administered 2017-10-17 – 2017-10-19 (×9): 2 mg via INTRAVENOUS
  Administered 2017-10-19 – 2017-10-20 (×2): 3 mg via INTRAVENOUS
  Administered 2017-10-20: 2 mg via INTRAVENOUS
  Administered 2017-10-20: 3 mg via INTRAVENOUS
  Administered 2017-10-21 (×2): 2 mg via INTRAVENOUS
  Administered 2017-10-21: 3 mg via INTRAVENOUS
  Administered 2017-10-21: 2 mg via INTRAVENOUS
  Administered 2017-10-21: 3 mg via INTRAVENOUS
  Administered 2017-10-22 (×3): 2 mg via INTRAVENOUS
  Filled 2017-10-17 (×8): qty 1
  Filled 2017-10-17: qty 2
  Filled 2017-10-17 (×2): qty 1
  Filled 2017-10-17 (×3): qty 2
  Filled 2017-10-17 (×2): qty 1
  Filled 2017-10-17: qty 2
  Filled 2017-10-17 (×3): qty 1

## 2017-10-17 MED ORDER — PROPOFOL 10 MG/ML IV BOLUS
INTRAVENOUS | Status: DC | PRN
  Administered 2017-10-17: 30 mg via INTRAVENOUS

## 2017-10-17 MED ORDER — PROPOFOL 500 MG/50ML IV EMUL
INTRAVENOUS | Status: DC | PRN
  Administered 2017-10-17: 150 ug/kg/min via INTRAVENOUS

## 2017-10-17 MED ORDER — HALOPERIDOL LACTATE 5 MG/ML IJ SOLN
2.0000 mg | Freq: Four times a day (QID) | INTRAMUSCULAR | Status: DC | PRN
  Administered 2017-10-17 – 2017-10-28 (×9): 2 mg via INTRAVENOUS
  Filled 2017-10-17 (×10): qty 1

## 2017-10-17 MED ORDER — QUETIAPINE FUMARATE 25 MG PO TABS
25.0000 mg | ORAL_TABLET | Freq: Every day | ORAL | Status: DC
  Administered 2017-10-18 – 2017-10-30 (×10): 25 mg via ORAL
  Filled 2017-10-17 (×11): qty 1

## 2017-10-17 MED ORDER — LIDOCAINE 2% (20 MG/ML) 5 ML SYRINGE
INTRAMUSCULAR | Status: DC | PRN
  Administered 2017-10-17: 100 mg via INTRAVENOUS

## 2017-10-17 MED ORDER — PROPOFOL 10 MG/ML IV BOLUS
INTRAVENOUS | Status: AC
  Filled 2017-10-17: qty 40

## 2017-10-17 SURGICAL SUPPLY — 15 items

## 2017-10-17 NOTE — Anesthesia Postprocedure Evaluation (Signed)
Anesthesia Post Note  Patient: [name]  Procedure(s) Performed: ESOPHAGOGASTRODUODENOSCOPY (EGD) WITH PROPOFOL (N/A ) BIOPSY     Patient location during evaluation: PACU Anesthesia Type: MAC Level of consciousness: awake and alert Pain management: pain level controlled Vital Signs Assessment: post-procedure vital signs reviewed and stable Respiratory status: spontaneous breathing, nonlabored ventilation, respiratory function stable and patient connected to nasal cannula oxygen Cardiovascular status: stable and blood pressure returned to baseline Postop Assessment: no apparent nausea or vomiting Anesthetic complications: no    Last Vitals:  Vitals:   10/17/17 1021 10/17/17 1100  BP: (!) 90/41   Pulse: 73   Resp: (!) 24 (!) 22  Temp:    SpO2: 97% 97%    Last Pain:  Vitals:   10/17/17 1021  TempSrc:   PainSc: 0-No pain                 Caria Transue S

## 2017-10-17 NOTE — Progress Notes (Signed)
PROGRESS NOTE    [name]  ZOX:[id number] DOB: 06-Jun-1955 DOA: 10/16/2017 PCP: Rinaldo Cloud, MD   Brief Narrative:  HPI on 10/16/2017 by Dr. Latrelle Dodrill [name] is a 62 y.o. male with medical history significant of MS, bipolar disorder, alcohol and tobacco abuse, presented to the emergency department complaining of abdominal pain with nausea and vomiting.  Patient is poor historian.  He reports to 2-3 days of abdominal pain associated with bloody nausea and vomiting.  Patient reported epigastric pain started approximately 2 weeks ago which has been worse with eating.  Denies diarrhea, constipation and fever or chills.  Last alcohol use 3 days ago.  Normally drinks occasionally  but can drink up to 12 pack a day.  Other associated symptoms include weakness/faint.  Denies NSAID/ASA use, no history of GI bleeding in the past.  Patient denies dark stools.  Assessment & Plan   Symptomatic anemia/GI bleed -Presented with weakness, palpitations and soft blood pressure -Chronic use of alcohol; denies use of NSAIDs -Hemoglobin approximately 14-15, on admission was 10.1 -Despite 1 unit PRBC, hemoglobin currently 8.2 -FOBT positive -Continue Protonix drip -Anemia panel showed an iron of 77, ferritin of 122, vitamin B12 180, folate 11.4 -Given that vitamin B12 is on the low normal side, will give dose of B12 -Gastroenterology consulted and appreciated, status post EGD: Showed very large, deep and created duodenal bulb/duodenal sweep ulcer with pigmented spots.  LA grade D esophagitis -[facility] enterology recommended to transfuse and keep hemoglobin around 8.  If evidence of rebleeding recommended IR consult for empiric embolization of GDA.  Acute kidney injury with mild hyperkalemia -Prerenal given blood loss -Creatinine on admission 1.4, currently 1.31 -Baseline creatinine approximately 0.9-1 -Continue to monitor BMP -Hyperkalemia resolved  Multiple sclerosis -Neurology, Dr.  Epimenio Foot -Stable, continue tecfidera  Bipolar disorder -Continue Abilify, Wellbutrin, Lexapro, lithium  Alcohol abuse -Continue CIWA -will also add on haldol  Tobacco abuse -Nicotine cessation discussed, declined nicotine patch  DVT Prophylaxis  SCDs  Code Status: Full  Family Communication: None at bedside  Disposition Plan:  Observation. Pending further recommendations from GI and stabilization of hemoglobin  Consultants Gastroenterology  Procedures  EGD  Antibiotics   Anti-infectives (From admission, onward)   None      Subjective:   [name] seen and examined today.  Denies current chest pain, shortness of breath, abdominal pain, nausea vomiting, diarrhea or constipation.  Denies any hematemesis.  Has had dark stools.  Objective:   Vitals:   10/17/17 1021 10/17/17 1100 10/17/17 1200 10/17/17 1400  BP: (!) 90/41   (!) 93/53  Pulse: 73     Resp: (!) 24 (!) 22 14 (!) 21  Temp:      TempSrc:      SpO2: 97% 97% 98%   Weight:      Height:        Intake/Output Summary (Last 24 hours) at 10/17/2017 1447 Last data filed at 10/17/2017 1100 Gross per 24 hour  Intake 2951.92 ml  Output 1275 ml  Net 1676.92 ml   Filed Weights   10/17/17 0859  Weight: 80.1 kg (176 lb 9.4 oz)    Exam  General: Well developed, chronically ill-appearing, no apparent distress  HEENT: NCAT,  mucous membranes moist. Has sunglasses on and will not remove.  Neck: Supple  Cardiovascular: S1 S2 auscultated, no rubs, murmurs or gallops. Regular rate and rhythm.  Respiratory: Clear to auscultation bilaterally with equal chest rise  Abdomen: Soft, nontender, nondistended, + bowel  sounds  Extremities: warm dry without cyanosis clubbing or edema  Neuro: AAOx3, nonfocal  Skin: Without rashes exudates or nodules  Psych: Normal affect and demeanor with intact judgement and insight   Data Reviewed: I have personally reviewed following labs and imaging studies  CBC: Recent Labs   Lab 10/16/17 1130 10/16/17 1425 10/16/17 2104 10/17/17 0328 10/17/17 1103  WBC 9.8 8.6 8.0 6.0 4.7  HGB 10.1* 8.6* 9.1* 8.6* 8.2*  HCT 30.4* 26.6* 26.8* 25.7* 24.6*  MCV 98.7 100.4* 95.4 95.5 96.9  PLT 298 279 224 195 210   Basic Metabolic Panel: Recent Labs  Lab 10/16/17 1130 10/17/17 0328  NA 140 137  K 5.2* 3.8  CL 108 109  CO2 22 21*  GLUCOSE 134* 106*  BUN 65* 40*  CREATININE 1.40* 1.31*  CALCIUM 8.5* 8.0*  MG  --  2.1   GFR: Estimated Creatinine Clearance: 62.3 mL/min (A) (by C-G formula based on SCr of 1.31 mg/dL (H)). Liver Function Tests: Recent Labs  Lab 10/16/17 1130  AST 19  ALT 12  ALKPHOS 50  BILITOT 0.4  PROT 6.5  ALBUMIN 3.2*   No results for input(s): LIPASE, AMYLASE in the last 168 hours. No results for input(s): AMMONIA in the last 168 hours. Coagulation Profile: Recent Labs  Lab 10/16/17 1130  INR 1.14   Cardiac Enzymes: No results for input(s): CKTOTAL, CKMB, CKMBINDEX, TROPONINI in the last 168 hours. BNP (last 3 results) No results for input(s): PROBNP in the last 8760 hours. HbA1C: No results for input(s): HGBA1C in the last 72 hours. CBG: No results for input(s): GLUCAP in the last 168 hours. Lipid Profile: No results for input(s): CHOL, HDL, LDLCALC, TRIG, CHOLHDL, LDLDIRECT in the last 72 hours. Thyroid Function Tests: No results for input(s): TSH, T4TOTAL, FREET4, T3FREE, THYROIDAB in the last 72 hours. Anemia Panel: Recent Labs    10/17/17 0328  VITAMINB12 180  FOLATE 11.4  FERRITIN 122  TIBC 213*  IRON 77  RETICCTPCT 1.9   Urine analysis:    Component Value Date/Time   COLORURINE YELLOW 09/24/2016 0924   APPEARANCEUR CLEAR 09/24/2016 0924   LABSPEC 1.009 09/24/2016 0924   PHURINE 7.0 09/24/2016 0924   GLUCOSEU NEGATIVE 09/24/2016 0924   HGBUR NEGATIVE 09/24/2016 0924   BILIRUBINUR NEGATIVE 09/24/2016 0924   KETONESUR NEGATIVE 09/24/2016 0924   PROTEINUR NEGATIVE 09/24/2016 0924   UROBILINOGEN 0.2  07/08/2013 0930   NITRITE NEGATIVE 09/24/2016 0924   LEUKOCYTESUR NEGATIVE 09/24/2016 0924   Sepsis Labs: @LABRCNTIP (procalcitonin:4,lacticidven:4)  ) Recent Results (from the past 240 hour(s))  MRSA PCR Screening     Status: None   Collection Time: 10/16/17  3:21 PM  Result Value Ref Range Status   MRSA by PCR NEGATIVE NEGATIVE Final    Comment:        The GeneXpert MRSA Assay (FDA approved for NASAL specimens only), is one component of a comprehensive MRSA colonization surveillance program. It is not intended to diagnose MRSA infection nor to guide or monitor treatment for MRSA infections. Performed at [facility], [address], [place], [place] [address]       Radiology Studies: No results found.   Scheduled Meds: . ARIPiprazole  5 mg Oral Daily  . buPROPion  300 mg Oral Daily  . Dimethyl Fumarate  240 mg Oral BID  . escitalopram  20 mg Oral Daily  . folic acid  1 mg Oral Daily  . lithium carbonate  450 mg Oral BID  . multivitamin  with minerals  1 tablet Oral Daily  . sodium chloride flush  3 mL Intravenous Q12H  . thiamine  100 mg Oral Daily   Or  . thiamine  100 mg Intravenous Daily   Continuous Infusions: . dextrose 5 % and 0.45% NaCl 50 mL/hr at 10/17/17 1100  . pantoprozole (PROTONIX) infusion 8 mg/hr (10/17/17 1100)     LOS: 0 days   Time Spent in minutes   45 minutes  Ovidio Steele D.O. on 10/17/2017 at 2:47 PM  Between 7am to 7pm - Pager - [phone]  After 7pm go to [email or web address] - password [email or web address]  And look for the night coverage person covering for me after hours  [facility] Office  [phone]

## 2017-10-17 NOTE — Progress Notes (Signed)
Nutrition Brief Note  Patient identified on the Malnutrition Screening Tool (MST) Report  Wt Readings from Last 15 Encounters:  10/17/17 176 lb 9.4 oz (80.1 kg)  04/08/17 176 lb 8 oz (80.1 kg)  10/04/16 179 lb (81.2 kg)  09/23/16 185 lb (83.9 kg)  04/24/16 185 lb (83.9 kg)  04/05/16 183 lb (83 kg)  01/16/16 183 lb (83 kg)  07/15/15 189 lb 8 oz (86 kg)  11/25/14 181 lb (82.1 kg)  07/14/14 183 lb (83 kg)  07/14/14 183 lb (83 kg)  06/29/14 183 lb 12.8 oz (83.4 kg)  11/19/11 175 lb (79.4 kg)  11/02/11 186 lb 8.2 oz (84.6 kg)  05/21/11 194 lb 14.2 oz (88.4 kg)    Body mass index is 24.63 kg/m. Patient meets criteria for normal weight based on current BMI. Skin WDL. Patient with hx of MS. He arrived to the ED with epigastric pain and N/V x2 days PTA. He had episodes of vomiting blood following by coffee ground emesis.   Current diet order is CLD, advanced from NPO at 10:14 AM following Endoscopy. Note states findings of endoscopy are very large, deep duodenal ulcer with spots; this is thought to be the source of bleeding. Also findings of grade D esophagitis.  Medications reviewed; 1 mg oral folic acid/day, 20 mg IV Lasix x1 dose yesterday, daily multivitamin with minerals, 8 mg/hr IV Protonix/day, 100 mg oral thiamine/day. Labs reviewed; BUN: 40 mg/dL, creatinine: 2.42 mg/dL, Ca: 8 mg/dL, GFR: 57 mL/min.   IVF: D5-1/2 NS @ 50 mL/hr (204 kcal).   No nutrition interventions warranted at this time. If nutrition issues arise, please consult RD.      Trenton Gammon, MS, RD, LDN, [facility] Inpatient Clinical Dietitian Pager # [phone] After hours/weekend pager # [phone]

## 2017-10-17 NOTE — Brief Op Note (Signed)
10/16/2017 - 10/17/2017  10:09 AM  PATIENT:  [name]  62 y.o. male  PRE-OPERATIVE DIAGNOSIS:  upper GI bleed  POST-OPERATIVE DIAGNOSIS:  duodenal ulcer esophagitis  PROCEDURE:  Procedure(s): ESOPHAGOGASTRODUODENOSCOPY (EGD) WITH PROPOFOL (N/A)  SURGEON:  Surgeon(s) and Role:    * Kamori Barbier, MD - Primary  Findings ------------ - EGD showed very large , deep and created duodenal bulb/duodenal sweep ulcer with pigmented spots.this is most likely bleeding source. It also showed LA grade D esophagitis.  Recommendations -------------------------- - continue Protonix drip for now. - Monitor H&H - Transfuse as Needed to keep Hgb around 8  - If there is any evidence of rebleeding - recommend interventional radiology consult for empiric embolization of GDA. Because of very large sized ulcer,it would not be amenable for endoscopic treatment if it rebleeds. ( May consider repeat EGD with Hemospray treatment but chances of that leading to hemostasis would be less.) - avoid NSAIDs. Avoid alcohol. GI will follow.  Kathi Der MD, FACP 10/17/2017, 10:12 AM  Contact #  [phone]

## 2017-10-17 NOTE — Care Management Obs Status (Signed)
MEDICARE OBSERVATION STATUS NOTIFICATION   Patient Details  Name: AMANPREET OATHOUT MRN: [id number] Date of Birth: 10/14/1955   Medicare Observation Status Notification Given:  Yes    Golda Acre, RN 10/17/2017, 11:08 AM

## 2017-10-17 NOTE — Anesthesia Preprocedure Evaluation (Signed)
Anesthesia Evaluation  Patient identified by MRN, date of birth, ID band Patient awake    Reviewed: Allergy & Precautions, NPO status , Patient's Chart, lab work & pertinent test results  Airway Mallampati: II  TM Distance: >3 FB Neck ROM: Full    Dental no notable dental hx.    Pulmonary Current Smoker,    Pulmonary exam normal breath sounds clear to auscultation       Cardiovascular negative cardio ROS Normal cardiovascular exam Rhythm:Regular Rate:Normal     Neuro/Psych Bipolar Disorder negative neurological ROS     GI/Hepatic negative GI ROS, (+)     substance abuse  alcohol use,   Endo/Other  negative endocrine ROS  Renal/GU negative Renal ROS  negative genitourinary   Musculoskeletal negative musculoskeletal ROS (+)   Abdominal   Peds negative pediatric ROS (+)  Hematology negative hematology ROS (+)   Anesthesia Other Findings   Reproductive/Obstetrics negative OB ROS                             Anesthesia Physical Anesthesia Plan  ASA: III  Anesthesia Plan: MAC   Post-op Pain Management:    Induction: Intravenous  PONV Risk Score and Plan: 0  Airway Management Planned: Nasal Cannula  Additional Equipment:   Intra-op Plan:   Post-operative Plan:   Informed Consent: I have reviewed the patients History and Physical, chart, labs and discussed the procedure including the risks, benefits and alternatives for the proposed anesthesia with the patient or authorized representative who has indicated his/her understanding and acceptance.   Dental advisory given  Plan Discussed with: CRNA and Surgeon  Anesthesia Plan Comments:         Anesthesia Quick Evaluation

## 2017-10-17 NOTE — Transfer of Care (Signed)
Immediate Anesthesia Transfer of Care Note  Patient: [name]  Procedure(s) Performed: ESOPHAGOGASTRODUODENOSCOPY (EGD) WITH PROPOFOL (N/A )  Patient Location: PACU  Anesthesia Type:MAC  Level of Consciousness: awake, alert  and oriented  Airway & Oxygen Therapy: Patient Spontanous Breathing and Patient connected to nasal cannula oxygen  Post-op Assessment: Report given to RN and Post -op Vital signs reviewed and stable  Post vital signs: Reviewed and stable  Last Vitals:  Vitals Value Taken Time  BP 92/35 10/17/2017 10:13 AM  Temp 36.7 C 10/17/2017 10:12 AM  Pulse 71 10/17/2017 10:16 AM  Resp 27 10/17/2017 10:16 AM  SpO2 97 % 10/17/2017 10:16 AM  Vitals shown include unvalidated device data.  Last Pain:  Vitals:   10/17/17 1012  TempSrc: Oral  PainSc: 0-No pain         Complications: No apparent anesthesia complications

## 2017-10-17 NOTE — Op Note (Addendum)
[facility] Patient Name: [name] Procedure Date: 10/17/2017 MRN: [id number] Attending MD: Kathi Der , MD Date of Birth: June 27, 1955 CSN: [id number] Age: 62 Admit Type: Inpatient Procedure:                Upper GI endoscopy Indications:              Coffee-ground emesis, Hematochezia Providers:                Kathi Der, MD, Janae Sauce. Hinson, RN, [employer], Technician, [employer], CRNA Referring MD:              Medicines:                Sedation Administered by an Anesthesia Professional Complications:            No immediate complications. Estimated Blood Loss:     Estimated blood loss was minimal. Procedure:                Pre-Anesthesia Assessment:                           - Prior to the procedure, a History and Physical                            was performed, and patient medications and                            allergies were reviewed. The patient's tolerance of                            previous anesthesia was also reviewed. The risks                            and benefits of the procedure and the sedation                            options and risks were discussed with the patient.                            All questions were answered, and informed consent                            was obtained. Prior Anticoagulants: The patient has                            taken no previous anticoagulant or antiplatelet                            agents. ASA Grade Assessment: III - A patient with                            severe systemic disease. After reviewing the risks  and benefits, the patient was deemed in                            satisfactory condition to undergo the procedure.                           After obtaining informed consent, the endoscope was                            passed under direct vision. Throughout the                            procedure, the patient's  blood pressure, pulse, and                            oxygen saturations were monitored continuously. The                            [id number] ([id number]) scope was introduced through the                            mouth, and advanced to the second part of duodenum.                            The upper GI endoscopy was accomplished without                            difficulty. The patient tolerated the procedure                            well. Scope In: Scope Out: Findings:      LA Grade D (one or more mucosal breaks involving at least 75% of       esophageal circumference) esophagitis was found in the Mid and distal       esophagus. Biopsies were taken with a cold forceps for histology.      Diffuse mild inflammation was found in the entire examined stomach.       Biopsies were taken with a cold forceps for Helicobacter pylori testing.      The cardia and gastric fundus were normal on retroflexion.      Very large One non-obstructing non-bleeding cratered and deep duodenal       ulcer with pigmented material was found in the duodenal bulb/ duodenal       sweep. The lesion was 20 mm in largest dimension.      The second portion of the duodenum was normal. Impression:               - LA Grade D non-erosive esophagitis. Biopsied.                           - Chronic gastritis. Biopsied.                           - One non-obstructing non-bleeding duodenal ulcer  with pigmented material.                           - Normal second portion of the duodenum. Moderate Sedation:      Moderate (conscious) sedation was personally administered by an       anesthesia professional. The following parameters were monitored: oxygen       saturation, heart rate, blood pressure, and response to care. Recommendation:           - Return patient to hospital ward for ongoing care.                           - Clear liquid diet.                           - Continue present medications.                            - Await pathology results.                           - Refer to an interventional radiologist if                            symptoms persist.                           - Repeat upper endoscopy in 2 months to check                            healing. Procedure Code(s):        --- Professional ---                           [phone], Esophagogastroduodenoscopy, flexible,                            transoral; with biopsy, single or multiple Diagnosis Code(s):        --- Professional ---                           K20.8, Other esophagitis                           K29.50, Unspecified chronic gastritis without                            bleeding                           K26.9, Duodenal ulcer, unspecified as acute or                            chronic, without hemorrhage or perforation                           K92.0, Hematemesis  K92.1, Melena (includes Hematochezia) CPT copyright 2017 American Medical Association. All rights reserved. The codes documented in this report are preliminary and upon coder review may  be revised to meet current compliance requirements. Kathi Der, MD Kathi Der, MD 10/17/2017 10:08:56 AM Number of Addenda: 0

## 2017-10-17 NOTE — Progress Notes (Signed)
Pt has at home multiple sclerosis med (dimethyl fumarate CPDR) not stocked by pharmacy. Pt agreed for RN to contact [name], pt's ex-wife, to be given key to his house to bring medication to hospital. Attempted to call [name], with no answer.

## 2017-10-17 NOTE — Interval H&P Note (Signed)
History and Physical Interval Note:  10/17/2017 9:08 AM  [name]  has presented today for surgery, with the diagnosis of upper GI bleed  The various methods of treatment have been discussed with the patient and family. After consideration of risks, benefits and other options for treatment, the patient has consented to  Procedure(s): ESOPHAGOGASTRODUODENOSCOPY (EGD) WITH PROPOFOL (N/A) as a surgical intervention .  The patient's history has been reviewed, patient examined, no change in status, stable for surgery.  I have reviewed the patient's chart and labs.  Questions were answered to the patient's satisfaction.    Risks (bleeding, infection, bowel perforation that could require surgery, sedation-related changes in cardiopulmonary systems), benefits (identification and possible treatment of source of symptoms, exclusion of certain causes of symptoms), and alternatives (watchful waiting, radiographic imaging studies, empiric medical treatment)  were explained to patient in detail and patient wishes to proceed.   Shardai Star

## 2017-10-18 ENCOUNTER — Encounter: Payer: Self-pay | Admitting: Neurology

## 2017-10-18 ENCOUNTER — Encounter (HOSPITAL_COMMUNITY): Payer: Self-pay | Admitting: Gastroenterology

## 2017-10-18 DIAGNOSIS — K2950 Unspecified chronic gastritis without bleeding: Secondary | ICD-10-CM | POA: Diagnosis present

## 2017-10-18 DIAGNOSIS — F17210 Nicotine dependence, cigarettes, uncomplicated: Secondary | ICD-10-CM | POA: Diagnosis present

## 2017-10-18 DIAGNOSIS — Z781 Physical restraint status: Secondary | ICD-10-CM | POA: Diagnosis not present

## 2017-10-18 DIAGNOSIS — F05 Delirium due to known physiological condition: Secondary | ICD-10-CM | POA: Diagnosis not present

## 2017-10-18 DIAGNOSIS — G9340 Encephalopathy, unspecified: Secondary | ICD-10-CM | POA: Diagnosis not present

## 2017-10-18 DIAGNOSIS — Z515 Encounter for palliative care: Secondary | ICD-10-CM | POA: Diagnosis not present

## 2017-10-18 DIAGNOSIS — N179 Acute kidney failure, unspecified: Secondary | ICD-10-CM | POA: Diagnosis present

## 2017-10-18 DIAGNOSIS — Z8249 Family history of ischemic heart disease and other diseases of the circulatory system: Secondary | ICD-10-CM | POA: Diagnosis not present

## 2017-10-18 DIAGNOSIS — K922 Gastrointestinal hemorrhage, unspecified: Secondary | ICD-10-CM | POA: Diagnosis present

## 2017-10-18 DIAGNOSIS — K208 Other esophagitis: Secondary | ICD-10-CM | POA: Diagnosis present

## 2017-10-18 DIAGNOSIS — G9341 Metabolic encephalopathy: Secondary | ICD-10-CM | POA: Diagnosis not present

## 2017-10-18 DIAGNOSIS — Z915 Personal history of self-harm: Secondary | ICD-10-CM | POA: Diagnosis not present

## 2017-10-18 DIAGNOSIS — D62 Acute posthemorrhagic anemia: Secondary | ICD-10-CM | POA: Diagnosis present

## 2017-10-18 DIAGNOSIS — Z6820 Body mass index (BMI) 20.0-20.9, adult: Secondary | ICD-10-CM | POA: Diagnosis not present

## 2017-10-18 DIAGNOSIS — F3130 Bipolar disorder, current episode depressed, mild or moderate severity, unspecified: Secondary | ICD-10-CM | POA: Diagnosis not present

## 2017-10-18 DIAGNOSIS — G35 Multiple sclerosis: Secondary | ICD-10-CM | POA: Diagnosis present

## 2017-10-18 DIAGNOSIS — R4182 Altered mental status, unspecified: Secondary | ICD-10-CM | POA: Diagnosis not present

## 2017-10-18 DIAGNOSIS — F1010 Alcohol abuse, uncomplicated: Secondary | ICD-10-CM | POA: Diagnosis present

## 2017-10-18 DIAGNOSIS — D649 Anemia, unspecified: Secondary | ICD-10-CM | POA: Diagnosis not present

## 2017-10-18 DIAGNOSIS — E46 Unspecified protein-calorie malnutrition: Secondary | ICD-10-CM | POA: Diagnosis present

## 2017-10-18 DIAGNOSIS — F314 Bipolar disorder, current episode depressed, severe, without psychotic features: Secondary | ICD-10-CM | POA: Diagnosis present

## 2017-10-18 DIAGNOSIS — E876 Hypokalemia: Secondary | ICD-10-CM | POA: Diagnosis not present

## 2017-10-18 DIAGNOSIS — E870 Hyperosmolality and hypernatremia: Secondary | ICD-10-CM | POA: Diagnosis not present

## 2017-10-18 DIAGNOSIS — F1210 Cannabis abuse, uncomplicated: Secondary | ICD-10-CM | POA: Diagnosis not present

## 2017-10-18 DIAGNOSIS — R451 Restlessness and agitation: Secondary | ICD-10-CM | POA: Diagnosis not present

## 2017-10-18 DIAGNOSIS — Z823 Family history of stroke: Secondary | ICD-10-CM | POA: Diagnosis not present

## 2017-10-18 DIAGNOSIS — E875 Hyperkalemia: Secondary | ICD-10-CM | POA: Diagnosis present

## 2017-10-18 DIAGNOSIS — K264 Chronic or unspecified duodenal ulcer with hemorrhage: Secondary | ICD-10-CM | POA: Diagnosis present

## 2017-10-18 DIAGNOSIS — Z7189 Other specified counseling: Secondary | ICD-10-CM | POA: Diagnosis not present

## 2017-10-18 DIAGNOSIS — F1290 Cannabis use, unspecified, uncomplicated: Secondary | ICD-10-CM | POA: Diagnosis not present

## 2017-10-18 LAB — CBC
HCT: 21.8 % — ABNORMAL LOW (ref 39.0–52.0)
Hemoglobin: 7.1 g/dL — ABNORMAL LOW (ref 13.0–17.0)
MCH: 31.8 pg (ref 26.0–34.0)
MCHC: 32.6 g/dL (ref 30.0–36.0)
MCV: 97.8 fL (ref 78.0–100.0)
Platelets: 214 10*3/uL (ref 150–400)
RBC: 2.23 MIL/uL — ABNORMAL LOW (ref 4.22–5.81)
RDW: 14.9 % (ref 11.5–15.5)
WBC: 5.5 10*3/uL (ref 4.0–10.5)

## 2017-10-18 LAB — COMPREHENSIVE METABOLIC PANEL
ALT: 12 U/L (ref 0–44)
AST: 19 U/L (ref 15–41)
Albumin: 2.5 g/dL — ABNORMAL LOW (ref 3.5–5.0)
Alkaline Phosphatase: 37 U/L — ABNORMAL LOW (ref 38–126)
Anion gap: 4 — ABNORMAL LOW (ref 5–15)
BUN: 22 mg/dL (ref 8–23)
CO2: 23 mmol/L (ref 22–32)
Calcium: 7.8 mg/dL — ABNORMAL LOW (ref 8.9–10.3)
Chloride: 114 mmol/L — ABNORMAL HIGH (ref 98–111)
Creatinine, Ser: 1.01 mg/dL (ref 0.61–1.24)
GFR calc Af Amer: >60 mL/min (ref >60)
GFR calc non Af Amer: >60 mL/min (ref >60)
Glucose, Bld: 106 mg/dL — ABNORMAL HIGH (ref 70–99)
Potassium: 4.2 mmol/L (ref 3.5–5.1)
Sodium: 141 mmol/L (ref 135–145)
Total Bilirubin: 0.5 mg/dL (ref 0.3–1.2)
Total Protein: 4.7 g/dL — ABNORMAL LOW (ref 6.5–8.1)

## 2017-10-18 LAB — PREPARE RBC (CROSSMATCH)

## 2017-10-18 LAB — HEMOGLOBIN AND HEMATOCRIT, BLOOD
HCT: 26.8 % — ABNORMAL LOW (ref 39.0–52.0)
Hemoglobin: 9.2 g/dL — ABNORMAL LOW (ref 13.0–17.0)

## 2017-10-18 MED ORDER — CYANOCOBALAMIN 1000 MCG/ML IJ SOLN
1000.0000 ug | Freq: Once | INTRAMUSCULAR | Status: AC
  Administered 2017-10-18: 1000 ug via INTRAMUSCULAR
  Filled 2017-10-18: qty 1

## 2017-10-18 MED ORDER — SODIUM CHLORIDE 0.9% IV SOLUTION
Freq: Once | INTRAVENOUS | Status: AC
  Administered 2017-10-18: 09:00:00 via INTRAVENOUS

## 2017-10-18 NOTE — Progress Notes (Addendum)
[facility] Progress Note  [name] 62 y.o. 1955/04/27  CC:  GI bleed   Subjective: patient is confused and agitated this morning probably from alcohol withdrawal. Discussed with the nursing staff. No evidence of coffee-ground emesis or black tarry stool or bright blood per rectum since yesterday.  ROS : unable to obtain as patient is confused.   Objective: Vital signs in last 24 hours: Vitals:   10/18/17 0400 10/18/17 0500  BP: (!) 90/52   Pulse:    Resp: (!) 22 (!) 26  Temp:    SpO2: 94% 93%    Physical Exam:  General. Agitated. Confused Abdomen. Soft, nontender, nondistended, bowel sounds present. No peritoneal signs HEART - mild tachycardia. LE : no edema    Lab Results: Recent Labs    10/17/17 0328 10/18/17 0319  NA 137 141  K 3.8 4.2  CL 109 114*  CO2 21* 23  GLUCOSE 106* 106*  BUN 40* 22  CREATININE 1.31* 1.01  CALCIUM 8.0* 7.8*  MG 2.1  --    Recent Labs    10/16/17 1130 10/18/17 0319  AST 19 19  ALT 12 12  ALKPHOS 50 37*  BILITOT 0.4 0.5  PROT 6.5 4.7*  ALBUMIN 3.2* 2.5*   Recent Labs    10/17/17 1103 10/18/17 0319  WBC 4.7 5.5  HGB 8.2* 7.1*  HCT 24.6* 21.8*  MCV 96.9 97.8  PLT 210 214   Recent Labs    10/16/17 1130  LABPROT 14.5  INR 1.14      Assessment/Plan: - upper GI bleed. EGD yesterday (06/27) showing very large and deep duodenal bulb/duodenal sweep ulcer with pigmented material and evidence of recent bleeding.biopsies pending. - LA Grade D esophagitis  - Acute blood loss anemia. - alcohol withdrawal   Recommendations -------------------------- - there has been 1 gm drop in hemoglobin this morning but no evidence of melena or coffee-ground emesis.BUN trending down. - continue Protonix drip for now. - Monitor H&H - Transfuse as Needed to keep Hgb around 8  - If there is any evidence of rebleeding/ongoing drop in hemoglobin - recommend interventional radiology consult for empiric embolization of GDA.  Because of very large sized ulcer,it would be very difficult and technically challenging to achieve hemostasis through endoscopic treatment. ( May consider repeat EGD with Hemospray treatment but chances of that leading to hemostasis would be less.) - recommend repeat EGD in 8 weeks to document healing of esophagitis as well as follow-up on large duodenal bulb ulcer. - avoid NSAIDs. Avoid alcohol. GI will follow.    Kathi Der MD, FACP 10/18/2017, 7:59 AM  Contact #  [phone]

## 2017-10-18 NOTE — Progress Notes (Signed)
PROGRESS NOTE    [name]  DDU:[id number] DOB: Nov 29, 1955 DOA: 10/16/2017 PCP: Rinaldo Cloud, MD   Brief Narrative:  HPI on 10/16/2017 by Dr. Latrelle Dodrill [name] is a 62 y.o. male with medical history significant of MS, bipolar disorder, alcohol and tobacco abuse, presented to the emergency department complaining of abdominal pain with nausea and vomiting.  Patient is poor historian.  He reports to 2-3 days of abdominal pain associated with bloody nausea and vomiting.  Patient reported epigastric pain started approximately 2 weeks ago which has been worse with eating.  Denies diarrhea, constipation and fever or chills.  Last alcohol use 3 days ago.  Normally drinks occasionally  but can drink up to 12 pack a day.  Other associated symptoms include weakness/faint.  Denies NSAID/ASA use, no history of GI bleeding in the past.  Patient denies dark stools.  Assessment & Plan   Symptomatic anemia/GI bleed -Presented with weakness, palpitations and soft blood pressure -Chronic use of alcohol; denies use of NSAIDs -Hemoglobin approximately 14-15, on admission was 10.1 -Despite 1 unit PRBC, hemoglobin continues to drop, currently 7.1 -FOBT positive -Continue Protonix drip -Anemia panel showed an iron of 77, ferritin of 122, vitamin B12 180, folate 11.4 -Given that vitamin B12 is on the low normal side, dose of B12 given -Gastroenterology consulted and appreciated, status post EGD: Showed very large, deep and created duodenal bulb/duodenal sweep ulcer with pigmented spots.  LA grade D esophagitis -[facility] enterology recommended to transfuse and keep hemoglobin around 8.  If evidence of rebleeding recommended IR consult for empiric embolization of GDA. -will transfuse 2u PRBC today -no episodes of melena or hematochezia  Acute kidney injury with mild hyperkalemia -Prerenal given blood loss -Creatinine on admission 1.4, currently 1.01 -Baseline creatinine approximately 0.9-1 -Continue to  monitor BMP -Hyperkalemia resolved  Multiple sclerosis -Neurology, Dr. Epimenio Foot -Stable, continue tecfidera  Bipolar disorder -Continue Abilify, Wellbutrin, Lexapro, lithium  Alcohol abuse with acute delirium -Continue CIWA- changed to stepdown protocol -added haldol q6PRN and seroquel QHS -mental status improved  Tremors -possibly related to MS vs alcohol withdrawal -Continue to monitor   Tobacco abuse -Nicotine cessation discussed, declined nicotine patch  DVT Prophylaxis  SCDs  Code Status: Full  Family Communication: None at bedside  Disposition Plan:  Observation. Pending further recommendations from GI and stabilization of hemoglobin  Consultants Gastroenterology  Procedures  EGD  Antibiotics   Anti-infectives (From admission, onward)   None      Subjective:   [name] seen and examined today.  Today patient states he "feels like sh!*". Has no specific complaints. Denies chest pain, shortness of breath, abdominal pain, nausea, vomiting, dark stools. Does complain of not feeling well and feeling weak.   Objective:   Vitals:   10/18/17 0200 10/18/17 0319 10/18/17 0400 10/18/17 0500  BP: (!) 119/96  (!) 90/52   Pulse:      Resp: (!) 22  (!) 22 (!) 26  Temp:  98.7 F (37.1 C)    TempSrc:  Axillary    SpO2: 99%  94% 93%  Weight:      Height:        Intake/Output Summary (Last 24 hours) at 10/18/2017 0758 Last data filed at 10/18/2017 0500 Gross per 24 hour  Intake 3225.25 ml  Output 1720 ml  Net 1505.25 ml   Filed Weights   10/17/17 0859  Weight: 80.1 kg (176 lb 9.4 oz)   Exam  General: Well developed, chronically ill appearing, NAD  HEENT: NCAT,  mucous membranes moist.   Neck: Supple  Cardiovascular: S1 S2 auscultated, no rubs, murmurs or gallops. Regular rate and rhythm.  Respiratory: Clear to auscultation bilaterally with equal chest rise  Abdomen: Soft, nontender, nondistended, + bowel sounds  Extremities: warm dry without  cyanosis clubbing or edema  Neuro: AAOx3, nonfocal  Psych: Flat  Data Reviewed: I have personally reviewed following labs and imaging studies  CBC: Recent Labs  Lab 10/16/17 1425 10/16/17 2104 10/17/17 0328 10/17/17 1103 10/18/17 0319  WBC 8.6 8.0 6.0 4.7 5.5  HGB 8.6* 9.1* 8.6* 8.2* 7.1*  HCT 26.6* 26.8* 25.7* 24.6* 21.8*  MCV 100.4* 95.4 95.5 96.9 97.8  PLT 279 224 195 210 214   Basic Metabolic Panel: Recent Labs  Lab 10/16/17 1130 10/17/17 0328 10/18/17 0319  NA 140 137 141  K 5.2* 3.8 4.2  CL 108 109 114*  CO2 22 21* 23  GLUCOSE 134* 106* 106*  BUN 65* 40* 22  CREATININE 1.40* 1.31* 1.01  CALCIUM 8.5* 8.0* 7.8*  MG  --  2.1  --    GFR: Estimated Creatinine Clearance: 80.8 mL/min (by C-G formula based on SCr of 1.01 mg/dL). Liver Function Tests: Recent Labs  Lab 10/16/17 1130 10/18/17 0319  AST 19 19  ALT 12 12  ALKPHOS 50 37*  BILITOT 0.4 0.5  PROT 6.5 4.7*  ALBUMIN 3.2* 2.5*   No results for input(s): LIPASE, AMYLASE in the last 168 hours. No results for input(s): AMMONIA in the last 168 hours. Coagulation Profile: Recent Labs  Lab 10/16/17 1130  INR 1.14   Cardiac Enzymes: No results for input(s): CKTOTAL, CKMB, CKMBINDEX, TROPONINI in the last 168 hours. BNP (last 3 results) No results for input(s): PROBNP in the last 8760 hours. HbA1C: No results for input(s): HGBA1C in the last 72 hours. CBG: No results for input(s): GLUCAP in the last 168 hours. Lipid Profile: No results for input(s): CHOL, HDL, LDLCALC, TRIG, CHOLHDL, LDLDIRECT in the last 72 hours. Thyroid Function Tests: No results for input(s): TSH, T4TOTAL, FREET4, T3FREE, THYROIDAB in the last 72 hours. Anemia Panel: Recent Labs    10/17/17 0328  VITAMINB12 180  FOLATE 11.4  FERRITIN 122  TIBC 213*  IRON 77  RETICCTPCT 1.9   Urine analysis:    Component Value Date/Time   COLORURINE YELLOW 09/24/2016 0924   APPEARANCEUR CLEAR 09/24/2016 0924   LABSPEC 1.009  09/24/2016 0924   PHURINE 7.0 09/24/2016 0924   GLUCOSEU NEGATIVE 09/24/2016 0924   HGBUR NEGATIVE 09/24/2016 0924   BILIRUBINUR NEGATIVE 09/24/2016 0924   KETONESUR NEGATIVE 09/24/2016 0924   PROTEINUR NEGATIVE 09/24/2016 0924   UROBILINOGEN 0.2 07/08/2013 0930   NITRITE NEGATIVE 09/24/2016 0924   LEUKOCYTESUR NEGATIVE 09/24/2016 0924   Sepsis Labs: @LABRCNTIP (procalcitonin:4,lacticidven:4)  ) Recent Results (from the past 240 hour(s))  MRSA PCR Screening     Status: None   Collection Time: 10/16/17  3:21 PM  Result Value Ref Range Status   MRSA by PCR NEGATIVE NEGATIVE Final    Comment:        The GeneXpert MRSA Assay (FDA approved for NASAL specimens only), is one component of a comprehensive MRSA colonization surveillance program. It is not intended to diagnose MRSA infection nor to guide or monitor treatment for MRSA infections. Performed at [facility], [address] [place], [place] [address]       Radiology Studies: No results found.   Scheduled Meds: . sodium chloride   Intravenous Once  .  ARIPiprazole  5 mg Oral Daily  . buPROPion  300 mg Oral Daily  . cyanocobalamin  1,000 mcg Intramuscular Once  . Dimethyl Fumarate  240 mg Oral BID  . escitalopram  20 mg Oral Daily  . folic acid  1 mg Oral Daily  . lithium carbonate  450 mg Oral BID  . multivitamin with minerals  1 tablet Oral Daily  . QUEtiapine  25 mg Oral QHS  . sodium chloride flush  3 mL Intravenous Q12H  . thiamine  100 mg Oral Daily   Or  . thiamine  100 mg Intravenous Daily   Continuous Infusions: . dextrose 5 % and 0.45% NaCl 50 mL/hr at 10/18/17 0729  . pantoprozole (PROTONIX) infusion 8 mg/hr (10/17/17 1953)     LOS: 0 days   Time Spent in minutes   45 minutes  Randie Bloodgood D.O. on 10/18/2017 at 7:58 AM  Between 7am to 7pm - Pager - [phone]  After 7pm go to [email or web address] - password [email or web address]  And look for the night coverage person covering for me  after hours  [facility] Office  [phone]

## 2017-10-19 DIAGNOSIS — E875 Hyperkalemia: Secondary | ICD-10-CM

## 2017-10-19 LAB — TYPE AND SCREEN
ABO/RH(D): A POS
Antibody Screen: NEGATIVE
Unit division: 0
Unit division: 0
Unit division: 0

## 2017-10-19 LAB — BPAM RBC
Blood Product Expiration Date: 201907162359
Blood Product Expiration Date: 201907162359
Blood Product Expiration Date: 201907242359
ISSUE DATE / TIME: 201906261509
ISSUE DATE / TIME: 201906280924
ISSUE DATE / TIME: 201906281211
Unit Type and Rh: 6200
Unit Type and Rh: 6200
Unit Type and Rh: 6200

## 2017-10-19 LAB — CBC
HCT: 26.9 % — ABNORMAL LOW (ref 39.0–52.0)
Hemoglobin: 9 g/dL — ABNORMAL LOW (ref 13.0–17.0)
MCH: 31.8 pg (ref 26.0–34.0)
MCHC: 33.5 g/dL (ref 30.0–36.0)
MCV: 95.1 fL (ref 78.0–100.0)
Platelets: 231 10*3/uL (ref 150–400)
RBC: 2.83 MIL/uL — ABNORMAL LOW (ref 4.22–5.81)
RDW: 16 % — ABNORMAL HIGH (ref 11.5–15.5)
WBC: 4.4 10*3/uL (ref 4.0–10.5)

## 2017-10-19 LAB — HEMOGLOBIN AND HEMATOCRIT, BLOOD
HCT: 26.8 % — ABNORMAL LOW (ref 39.0–52.0)
Hemoglobin: 9.1 g/dL — ABNORMAL LOW (ref 13.0–17.0)

## 2017-10-19 MED ORDER — PANTOPRAZOLE SODIUM 40 MG IV SOLR
40.0000 mg | Freq: Two times a day (BID) | INTRAVENOUS | Status: DC
  Administered 2017-10-19 – 2017-10-26 (×13): 40 mg via INTRAVENOUS
  Filled 2017-10-19 (×16): qty 40

## 2017-10-19 NOTE — Progress Notes (Signed)
PROGRESS NOTE    [name]  OZH:[id number] DOB: [birth year] DOA: 10/16/2017 PCP: Rinaldo Cloud, MD   Brief Narrative:  HPI on 10/16/2017 by Dr. Latrelle Dodrill [name] is a 62 y.o. male with medical history significant of MS, bipolar disorder, alcohol and tobacco abuse, presented to the emergency department complaining of abdominal pain with nausea and vomiting.  Patient is poor historian.  He reports to 2-3 days of abdominal pain associated with bloody nausea and vomiting.  Patient reported epigastric pain started approximately 2 weeks ago which has been worse with eating.  Denies diarrhea, constipation and fever or chills.  Last alcohol use 3 days ago.  Normally drinks occasionally  but can drink up to 12 pack a day.  Other associated symptoms include weakness/faint.  Denies NSAID/ASA use, no history of GI bleeding in the past.  Patient denies dark stools.  Interim history Admitted for symptomatic anemia and GI bleed. GI consulted, s/p EGD. Given several transfusions. Also having delirium, possibly from alcohol withdrawal.   Assessment & Plan   Symptomatic anemia/GI bleed -Presented with weakness, palpitations and soft blood pressure -Chronic use of alcohol; denies use of NSAIDs -Hemoglobin approximately 14-15, on admission was 10.1 -FOBT positive -Continue Protonix drip -Anemia panel showed an iron of 77, ferritin of 122, vitamin B12 180, folate 11.4 -Given that vitamin B12 is on the low normal side, dose of B12 given -Gastroenterology consulted and appreciated, status post EGD: Showed very large, deep and created duodenal bulb/duodenal sweep ulcer with pigmented spots.  LA grade D esophagitis -Gastroenterology recommended to transfuse and keep hemoglobin around 8.  If evidence of rebleeding recommended IR consult for empiric embolization of GDA. -no episodes of melena or hematochezia -has received 3u PRBCs during this hospitalization -Hemoglobin currently 9 -Continue to monitor  CBC  Acute kidney injury with mild hyperkalemia -Resolved -Prerenal given blood loss -Creatinine on admission 1.4, currently 1.01 -Baseline creatinine approximately 0.9-1 -Continue to monitor BMP  Multiple sclerosis -Neurology, Dr. Epimenio Foot -Stable, continue tecfidera  Bipolar disorder -Continue Abilify, Wellbutrin, Lexapro, lithium  Alcohol abuse with acute delirium -Continue CIWA- changed to stepdown protocol -Continue q6PRN and seroquel QHS -mental status improved  Tremors -possibly related to MS vs alcohol withdrawal -Continue to monitor   Tobacco abuse -Nicotine cessation discussed, declined nicotine patch  DVT Prophylaxis  SCDs  Code Status: Full  Family Communication: None at bedside  Disposition Plan: Admitted. Pending further recommendations from GI and stabilization of hemoglobin.   Consultants Gastroenterology  Procedures  EGD  Antibiotics   Anti-infectives (From admission, onward)   None      Subjective:   [name] seen and examined today.  Feels he is tired and wants to sleep. Denies dark stools. Denies chest pain, shortness of breath, abdominal pain, N/V.   Objective:   Vitals:   10/19/17 0000 10/19/17 0400 10/19/17 0428 10/19/17 0800  BP:  109/80    Pulse:      Resp:  (!) 24    Temp: 98.4 F (36.9 C)  98.3 F (36.8 C) (!) 97.5 F (36.4 C)  TempSrc: Axillary  Axillary Axillary  SpO2:  94%    Weight:      Height:        Intake/Output Summary (Last 24 hours) at 10/19/2017 0921 Last data filed at 10/19/2017 0622 Gross per 24 hour  Intake 1237.5 ml  Output 1295 ml  Net -57.5 ml   Filed Weights   10/17/17 0859  Weight: 80.1 kg (176 lb 9.4  oz)   Exam  General: Well developed, Chronically ill appearing, NAD  HEENT: NCAT, mucous membranes moist. Wearing sunglasses  Neck: Supple  Cardiovascular: S1 S2 auscultated, no rubs, murmurs or gallops. Regular rate and rhythm.  Respiratory: Clear to auscultation bilaterally with equal  chest rise  Abdomen: Soft, nontender, nondistended, + bowel sounds  Extremities: warm dry without cyanosis clubbing or edema  Neuro: AAOx3, nonfocal  Psych: Flat  Data Reviewed: I have personally reviewed following labs and imaging studies  CBC: Recent Labs  Lab 10/16/17 2104 10/17/17 0328 10/17/17 1103 10/18/17 0319 10/18/17 1618 10/19/17 0544  WBC 8.0 6.0 4.7 5.5  --  4.4  HGB 9.1* 8.6* 8.2* 7.1* 9.2* 9.0*  HCT 26.8* 25.7* 24.6* 21.8* 26.8* 26.9*  MCV 95.4 95.5 96.9 97.8  --  95.1  PLT 224 195 210 214  --  231   Basic Metabolic Panel: Recent Labs  Lab 10/16/17 1130 10/17/17 0328 10/18/17 0319  NA 140 137 141  K 5.2* 3.8 4.2  CL 108 109 114*  CO2 22 21* 23  GLUCOSE 134* 106* 106*  BUN 65* 40* 22  CREATININE 1.40* 1.31* 1.01  CALCIUM 8.5* 8.0* 7.8*  MG  --  2.1  --    GFR: Estimated Creatinine Clearance: 80.8 mL/min (by C-G formula based on SCr of 1.01 mg/dL). Liver Function Tests: Recent Labs  Lab 10/16/17 1130 10/18/17 0319  AST 19 19  ALT 12 12  ALKPHOS 50 37*  BILITOT 0.4 0.5  PROT 6.5 4.7*  ALBUMIN 3.2* 2.5*   No results for input(s): LIPASE, AMYLASE in the last 168 hours. No results for input(s): AMMONIA in the last 168 hours. Coagulation Profile: Recent Labs  Lab 10/16/17 1130  INR 1.14   Cardiac Enzymes: No results for input(s): CKTOTAL, CKMB, CKMBINDEX, TROPONINI in the last 168 hours. BNP (last 3 results) No results for input(s): PROBNP in the last 8760 hours. HbA1C: No results for input(s): HGBA1C in the last 72 hours. CBG: No results for input(s): GLUCAP in the last 168 hours. Lipid Profile: No results for input(s): CHOL, HDL, LDLCALC, TRIG, CHOLHDL, LDLDIRECT in the last 72 hours. Thyroid Function Tests: No results for input(s): TSH, T4TOTAL, FREET4, T3FREE, THYROIDAB in the last 72 hours. Anemia Panel: Recent Labs    10/17/17 0328  VITAMINB12 180  FOLATE 11.4  FERRITIN 122  TIBC 213*  IRON 77  RETICCTPCT 1.9   Urine  analysis:    Component Value Date/Time   COLORURINE YELLOW 09/24/2016 0924   APPEARANCEUR CLEAR 09/24/2016 0924   LABSPEC 1.009 09/24/2016 0924   PHURINE 7.0 09/24/2016 0924   GLUCOSEU NEGATIVE 09/24/2016 0924   HGBUR NEGATIVE 09/24/2016 0924   BILIRUBINUR NEGATIVE 09/24/2016 0924   KETONESUR NEGATIVE 09/24/2016 0924   PROTEINUR NEGATIVE 09/24/2016 0924   UROBILINOGEN 0.2 07/08/2013 0930   NITRITE NEGATIVE 09/24/2016 0924   LEUKOCYTESUR NEGATIVE 09/24/2016 0924   Sepsis Labs: @LABRCNTIP (procalcitonin:4,lacticidven:4)  ) Recent Results (from the past 240 hour(s))  MRSA PCR Screening     Status: None   Collection Time: 10/16/17  3:21 PM  Result Value Ref Range Status   MRSA by PCR NEGATIVE NEGATIVE Final    Comment:        The GeneXpert MRSA Assay (FDA approved for NASAL specimens only), is one component of a comprehensive MRSA colonization surveillance program. It is not intended to diagnose MRSA infection nor to guide or monitor treatment for MRSA infections. Performed at [facility], [address] [place],  [place] [address]       Radiology Studies: No results found.   Scheduled Meds: . ARIPiprazole  5 mg Oral Daily  . buPROPion  300 mg Oral Daily  . Dimethyl Fumarate  240 mg Oral BID  . escitalopram  20 mg Oral Daily  . folic acid  1 mg Oral Daily  . lithium carbonate  450 mg Oral BID  . multivitamin with minerals  1 tablet Oral Daily  . QUEtiapine  25 mg Oral QHS  . sodium chloride flush  3 mL Intravenous Q12H  . thiamine  100 mg Oral Daily   Or  . thiamine  100 mg Intravenous Daily   Continuous Infusions: . pantoprozole (PROTONIX) infusion 8 mg/hr (10/19/17 0600)     LOS: 1 day   Time Spent in minutes   30 minutes  Nyilah Kight D.O. on 10/19/2017 at 9:21 AM  Between 7am to 7pm - Pager - [phone]  After 7pm go to [email or web address] - password [email or web address]  And look for the night coverage person covering for me after  hours  [facility] Office  [phone]

## 2017-10-19 NOTE — Progress Notes (Signed)
[facility] Progress Note  Subjective: No specific complaints today.  Patient diagnosed with large duodenal bulb ulcer.  Also has esophagitis.  Also alcohol withdrawal.  Objective: Vital signs in last 24 hours: Temp:  [97.5 F (36.4 C)-98.7 F (37.1 C)] 97.5 F (36.4 C) (06/29 0800) Pulse Rate:  [68-91] 68 (06/29 0800) Resp:  [18-33] 26 (06/29 0800) BP: (95-140)/(48-82) 121/56 (06/29 0800) SpO2:  [82 %-100 %] 96 % (06/29 0800) Weight change:    PE:  No distress  Heart regular rhythm  Lungs clear  Abdomen soft nontender  Lab Results: Results for orders placed or performed during the hospital encounter of 10/16/17 (from the past 24 hour(s))  Hemoglobin and hematocrit, blood     Status: Abnormal   Collection Time: 10/18/17  4:18 PM  Result Value Ref Range   Hemoglobin 9.2 (L) 13.0 - 17.0 g/dL   HCT 16.1 (L) 09.6 - 04.5 %  CBC     Status: Abnormal   Collection Time: 10/19/17  5:44 AM  Result Value Ref Range   WBC 4.4 4.0 - 10.5 K/uL   RBC 2.83 (L) 4.22 - 5.81 MIL/uL   Hemoglobin 9.0 (L) 13.0 - 17.0 g/dL   HCT 40.9 (L) 81.1 - 91.4 %   MCV 95.1 78.0 - 100.0 fL   MCH 31.8 26.0 - 34.0 pg   MCHC 33.5 30.0 - 36.0 g/dL   RDW 78.2 (H) 95.6 - 21.3 %   Platelets 231 150 - 400 K/uL    Studies/Results: No results found.    Assessment: Duodenal ulcer  Esophagitis  Plan:   Continue PPI therapy.  If significant upper GI bleeding occurs I was informed by Dr Kandice Moos that we should call interventional radiology for embolization.    Gwenevere Abbot 10/19/2017, 12:04 PM  Pager: [phone] If no answer or after 5 PM call [phone]

## 2017-10-20 LAB — HEMOGLOBIN AND HEMATOCRIT, BLOOD
HCT: 24 % — ABNORMAL LOW (ref 39.0–52.0)
Hemoglobin: 7.9 g/dL — ABNORMAL LOW (ref 13.0–17.0)

## 2017-10-20 LAB — CBC
HCT: 24.6 % — ABNORMAL LOW (ref 39.0–52.0)
Hemoglobin: 8.2 g/dL — ABNORMAL LOW (ref 13.0–17.0)
MCH: 32.4 pg (ref 26.0–34.0)
MCHC: 33.3 g/dL (ref 30.0–36.0)
MCV: 97.2 fL (ref 78.0–100.0)
Platelets: 269 10*3/uL (ref 150–400)
RBC: 2.53 MIL/uL — ABNORMAL LOW (ref 4.22–5.81)
RDW: 15.7 % — ABNORMAL HIGH (ref 11.5–15.5)
WBC: 4.9 10*3/uL (ref 4.0–10.5)

## 2017-10-20 LAB — BASIC METABOLIC PANEL
Anion gap: 5 (ref 5–15)
BUN: 26 mg/dL — ABNORMAL HIGH (ref 8–23)
CO2: 22 mmol/L (ref 22–32)
Calcium: 8 mg/dL — ABNORMAL LOW (ref 8.9–10.3)
Chloride: 116 mmol/L — ABNORMAL HIGH (ref 98–111)
Creatinine, Ser: 1.09 mg/dL (ref 0.61–1.24)
GFR calc Af Amer: >60 mL/min (ref >60)
GFR calc non Af Amer: >60 mL/min (ref >60)
Glucose, Bld: 92 mg/dL (ref 70–99)
Potassium: 4.2 mmol/L (ref 3.5–5.1)
Sodium: 143 mmol/L (ref 135–145)

## 2017-10-20 NOTE — Progress Notes (Signed)
PROGRESS NOTE    [name]  RUE:[id number] DOB: January 11, 1956 DOA: 10/16/2017 PCP: Rinaldo Cloud, MD   Brief Narrative:  HPI on 10/16/2017 by Dr. Latrelle Dodrill [name] is a 62 y.o. male with medical history significant of MS, bipolar disorder, alcohol and tobacco abuse, presented to the emergency department complaining of abdominal pain with nausea and vomiting.  Patient is poor historian.  He reports to 2-3 days of abdominal pain associated with bloody nausea and vomiting.  Patient reported epigastric pain started approximately 2 weeks ago which has been worse with eating.  Denies diarrhea, constipation and fever or chills.  Last alcohol use 3 days ago.  Normally drinks occasionally  but can drink up to 12 pack a day.  Other associated symptoms include weakness/faint.  Denies NSAID/ASA use, no history of GI bleeding in the past.  Patient denies dark stools.  Interim history Admitted for symptomatic anemia and GI bleed. GI consulted, s/p EGD. Given several transfusions. Also having delirium, possibly from alcohol withdrawal.   Assessment & Plan   Symptomatic anemia/GI bleed -Presented with weakness, palpitations and soft blood pressure -Chronic use of alcohol; denies use of NSAIDs -Hemoglobin approximately 14-15, on admission was 10.1 -FOBT positive -Was on Protonix drip for 48 hours, and transitioned to IV BID -Anemia panel showed an iron of 77, ferritin of 122, vitamin B12 180, folate 11.4 -Given that vitamin B12 is on the low normal side, dose of B12 given -Gastroenterology consulted and appreciated, status post EGD: Showed very large, deep and created duodenal bulb/duodenal sweep ulcer with pigmented spots.  LA grade D esophagitis -Gastroenterology recommended to transfuse and keep hemoglobin around 8.  If evidence of rebleeding recommended IR consult for empiric embolization of GDA. -no episodes of melena or hematochezia -has received 3u PRBCs during this  hospitalization -Hemoglobin currently 8.2 -given continued drop in hemoglobin, will consult IR -Continue to monitor CBC  Acute kidney injury with mild hyperkalemia -Resolved -Prerenal given blood loss -Creatinine on admission 1.4, currently 1.09 -Baseline creatinine approximately 0.9-1 -Continue to monitor BMP  Multiple sclerosis -Neurology, Dr. Epimenio Foot -Stable, continue tecfidera  Bipolar disorder -Continue Abilify, Wellbutrin, Lexapro, lithium  Alcohol abuse with acute delirium -Continue CIWA- changed to stepdown protocol -Continue q6PRN and seroquel QHS -mental status has improved  Tremors -possibly related to MS vs alcohol withdrawal -Continue to monitor   Tobacco abuse -Nicotine cessation discussed, declined nicotine patch  Deconditioning  -will consult PT and OT  DVT Prophylaxis  SCDs  Code Status: Full  Family Communication: None at bedside  Disposition Plan: Admitted. Pending further recommendations from GI and stabilization of hemoglobin.   Consultants Gastroenterology  Procedures  EGD  Antibiotics   Anti-infectives (From admission, onward)   None      Subjective:   [name] seen and examined today. States he does not feel well (states "sh*&"). Has no particular complaint. Would like to go home. Denies chest pain, shortness of breath, abdominal pain, nausea, vomiting, diarrhea, constipation.   Objective:   Vitals:   10/19/17 2300 10/20/17 0007 10/20/17 0400 10/20/17 0800  BP:      Pulse:    97  Resp: (!) 26     Temp:  (!) 97.5 F (36.4 C) 97.9 F (36.6 C) 98.2 F (36.8 C)  TempSrc:  Axillary Axillary Axillary  SpO2: 95%  99%   Weight:      Height:        Intake/Output Summary (Last 24 hours) at 10/20/2017 0935 Last data filed  at 10/20/2017 0600 Gross per 24 hour  Intake 94.17 ml  Output 750 ml  Net -655.83 ml   Filed Weights   10/17/17 0859  Weight: 80.1 kg (176 lb 9.4 oz)   Exam  General: Well developed, chronically ill  appearing, NAD  HEENT: NCAT, mucous membranes moist. Wearing sunglasses  Neck: Supple  Cardiovascular: S1 S2 auscultated, RRR, no murmur.  Respiratory: Clear to auscultation bilaterally with equal chest rise  Abdomen: Soft, nontender, nondistended, + bowel sounds  Extremities: warm dry without cyanosis clubbing or edema  Neuro: AAOx3, nonfocal  Psych: Flat   Data Reviewed: I have personally reviewed following labs and imaging studies  CBC: Recent Labs  Lab 10/17/17 0328 10/17/17 1103 10/18/17 0319 10/18/17 1618 10/19/17 0544 10/19/17 1625 10/20/17 0431  WBC 6.0 4.7 5.5  --  4.4  --  4.9  HGB 8.6* 8.2* 7.1* 9.2* 9.0* 9.1* 8.2*  HCT 25.7* 24.6* 21.8* 26.8* 26.9* 26.8* 24.6*  MCV 95.5 96.9 97.8  --  95.1  --  97.2  PLT 195 210 214  --  231  --  269   Basic Metabolic Panel: Recent Labs  Lab 10/16/17 1130 10/17/17 0328 10/18/17 0319 10/20/17 0431  NA 140 137 141 143  K 5.2* 3.8 4.2 4.2  CL 108 109 114* 116*  CO2 22 21* 23 22  GLUCOSE 134* 106* 106* 92  BUN 65* 40* 22 26*  CREATININE 1.40* 1.31* 1.01 1.09  CALCIUM 8.5* 8.0* 7.8* 8.0*  MG  --  2.1  --   --    GFR: Estimated Creatinine Clearance: 74.8 mL/min (by C-G formula based on SCr of 1.09 mg/dL). Liver Function Tests: Recent Labs  Lab 10/16/17 1130 10/18/17 0319  AST 19 19  ALT 12 12  ALKPHOS 50 37*  BILITOT 0.4 0.5  PROT 6.5 4.7*  ALBUMIN 3.2* 2.5*   No results for input(s): LIPASE, AMYLASE in the last 168 hours. No results for input(s): AMMONIA in the last 168 hours. Coagulation Profile: Recent Labs  Lab 10/16/17 1130  INR 1.14   Cardiac Enzymes: No results for input(s): CKTOTAL, CKMB, CKMBINDEX, TROPONINI in the last 168 hours. BNP (last 3 results) No results for input(s): PROBNP in the last 8760 hours. HbA1C: No results for input(s): HGBA1C in the last 72 hours. CBG: No results for input(s): GLUCAP in the last 168 hours. Lipid Profile: No results for input(s): CHOL, HDL, LDLCALC,  TRIG, CHOLHDL, LDLDIRECT in the last 72 hours. Thyroid Function Tests: No results for input(s): TSH, T4TOTAL, FREET4, T3FREE, THYROIDAB in the last 72 hours. Anemia Panel: No results for input(s): VITAMINB12, FOLATE, FERRITIN, TIBC, IRON, RETICCTPCT in the last 72 hours. Urine analysis:    Component Value Date/Time   COLORURINE YELLOW 09/24/2016 0924   APPEARANCEUR CLEAR 09/24/2016 0924   LABSPEC 1.009 09/24/2016 0924   PHURINE 7.0 09/24/2016 0924   GLUCOSEU NEGATIVE 09/24/2016 0924   HGBUR NEGATIVE 09/24/2016 0924   BILIRUBINUR NEGATIVE 09/24/2016 0924   KETONESUR NEGATIVE 09/24/2016 0924   PROTEINUR NEGATIVE 09/24/2016 0924   UROBILINOGEN 0.2 07/08/2013 0930   NITRITE NEGATIVE 09/24/2016 0924   LEUKOCYTESUR NEGATIVE 09/24/2016 0924   Sepsis Labs: @LABRCNTIP (procalcitonin:4,lacticidven:4)  ) Recent Results (from the past 240 hour(s))  MRSA PCR Screening     Status: None   Collection Time: 10/16/17  3:21 PM  Result Value Ref Range Status   MRSA by PCR NEGATIVE NEGATIVE Final    Comment:        The GeneXpert MRSA Assay (FDA  approved for NASAL specimens only), is one component of a comprehensive MRSA colonization surveillance program. It is not intended to diagnose MRSA infection nor to guide or monitor treatment for MRSA infections. Performed at [facility], [address], [place], [place] [address]       Radiology Studies: No results found.   Scheduled Meds: . ARIPiprazole  5 mg Oral Daily  . buPROPion  300 mg Oral Daily  . Dimethyl Fumarate  240 mg Oral BID  . escitalopram  20 mg Oral Daily  . folic acid  1 mg Oral Daily  . lithium carbonate  450 mg Oral BID  . multivitamin with minerals  1 tablet Oral Daily  . pantoprazole (PROTONIX) IV  40 mg Intravenous Q12H  . QUEtiapine  25 mg Oral QHS  . sodium chloride flush  3 mL Intravenous Q12H  . thiamine  100 mg Oral Daily   Or  . thiamine  100 mg Intravenous Daily   Continuous  Infusions:    LOS: 2 days   Time Spent in minutes   30 minutes  Lemario Chaikin D.O. on 10/20/2017 at 9:35 AM  Between 7am to 7pm - Pager - [phone]  After 7pm go to [email or web address] - password [email or web address]  And look for the night coverage person covering for me after hours  [facility] Office  [phone]

## 2017-10-20 NOTE — Consult Note (Signed)
Chief Complaint: Patient was seen in consultation today for GI bleed  Referring Physician(s): Dr. Catha Gosselin  Supervising Physician: Oley Balm  Patient Status: [facility] - In-pt  History of Present Illness: [name] is a 62 y.o. male with past medical history of MS, neuromuscular disorder, depression, alcohol abuse who presented to [facility] with abdominal pain, nausea, and vomiting.  Patient was admitted for suspected GI bleeding.  He did undergo endoscopy with GI 10/17/17 which showed a large duodenal ulcer.  This was not felt to be treatable endoscopically and patient was started on Protonix drip.   He has been stable since EGD with HgB ~9.0 after 3u PRBC.  This AM, HgB 8.2.  IR consulted for suspected ongoing bleeding. Case reviewed by Dr. Deanne Coffer.   Past Medical History:  Diagnosis Date  . Depression   . Mental disorder   . Movement disorder   . MS (multiple sclerosis) (HCC)   . Multiple sclerosis, relapsing-remitting (HCC)   . Neuromuscular disorder (HCC)   . Vision abnormalities     Past Surgical History:  Procedure Laterality Date  . BIOPSY  10/17/2017   Procedure: BIOPSY;  Surgeon: Kathi Der, MD;  Location: [facility] ENDOSCOPY;  Service: Gastroenterology;;  . ESOPHAGOGASTRODUODENOSCOPY (EGD) WITH PROPOFOL N/A 10/17/2017   Procedure: ESOPHAGOGASTRODUODENOSCOPY (EGD) WITH PROPOFOL;  Surgeon: Kathi Der, MD;  Location: [facility]OS[facility];  Service: Gastroenterology;  Laterality: N/A;  . TONSILLECTOMY      Allergies: Patient has no known allergies.  Medications: Prior to Admission medications   Medication Sig Start Date End Date Taking? Authorizing Provider  Dimethyl Fumarate (TECFIDERA) 240 MG CPDR Take 1 capsule (240 mg total) by mouth 2 (two) times daily. 07/31/17  Yes Sater, Pearletha Furl, MD  escitalopram (LEXAPRO) 20 MG tablet Take 1 tablet (20 mg total) by mouth daily. For depression 09/25/12  Yes Thermon Leyland, NP  lithium carbonate (ESKALITH) 450 MG CR tablet Take  450 mg by mouth 2 (two) times daily. 04/04/16  Yes [provider]  ARIPiprazole (ABILIFY) 5 MG tablet Take 5 mg by mouth daily.  09/29/16   [provider]  buPROPion (WELLBUTRIN XL) 300 MG 24 hr tablet Take 1 tablet (300 mg total) by mouth daily. For depression Patient not taking: Reported on 10/16/2017 09/25/12   Thermon Leyland, NP     Family History  Problem Relation Age of Onset  . Heart disease Mother   . Stroke Father     Social History   Socioeconomic History  . Marital status: Legally Separated    Spouse name: Not on file  . Number of children: Not on file  . Years of education: Not on file  . Highest education level: Not on file  Occupational History  . Not on file  Social Needs  . Financial resource strain: Not on file  . Food insecurity:    Worry: Not on file    Inability: Not on file  . Transportation needs:    Medical: Not on file    Non-medical: Not on file  Tobacco Use  . Smoking status: Current Every Day Smoker    Packs/day: 3.00    Years: 36.00    Pack years: 108.00    Types: Cigarettes  . Smokeless tobacco: Never Used  Substance and Sexual Activity  . Alcohol use: Yes    Alcohol/week: 0.0 oz    Comment: 2-3 times a week.   . Drug use: Yes    Types: Marijuana    Comment: 1-2 times  a month  . Sexual activity: Not Currently    Birth control/protection: None  Lifestyle  . Physical activity:    Days per week: Not on file    Minutes per session: Not on file  . Stress: Not on file  Relationships  . Social connections:    Talks on phone: Not on file    Gets together: Not on file    Attends religious service: Not on file    Active member of club or organization: Not on file    Attends meetings of clubs or organizations: Not on file    Relationship status: Not on file  Other Topics Concern  . Not on file  Social History Narrative   ** Merged History Encounter **         Review of Systems: A 12 point ROS discussed and pertinent  positives are indicated in the HPI above.  All other systems are negative.  Review of Systems  Unable to perform ROS: Mental status change    Vital Signs: BP (!) 89/55 (BP Location: Left Arm)   Pulse 87   Temp 98.4 F (36.9 C) (Axillary)   Resp (!) 32   Ht 5\' 11"  (1.803 m)   Wt 176 lb 9.4 oz (80.1 kg)   SpO2 99%   BMI 24.63 kg/m   Physical Exam  Constitutional: He appears well-developed.  Cardiovascular: Normal rate, regular rhythm and normal heart sounds.  Pulmonary/Chest: Effort normal and breath sounds normal. No respiratory distress.  Abdominal: Normal appearance and bowel sounds are normal. He exhibits no distension and no ascites.  Neurological: He is alert.  Not oriented, in restraints  Skin: Skin is warm and dry.  Nursing note and vitals reviewed.    MD Evaluation Airway: WNL Heart: WNL Abdomen: WNL Chest/ Lungs: WNL ASA  Classification: 3 Mallampati/Airway Score: Two   Imaging: No results found.  Labs:  CBC: Recent Labs    10/17/17 1103 10/18/17 0319  10/19/17 0544 10/19/17 1625 10/20/17 0431 10/20/17 1212  WBC 4.7 5.5  --  4.4  --  4.9  --   HGB 8.2* 7.1*   < > 9.0* 9.1* 8.2* 7.9*  HCT 24.6* 21.8*   < > 26.9* 26.8* 24.6* 24.0*  PLT 210 214  --  231  --  269  --    < > = values in this interval not displayed.    COAGS: Recent Labs    10/16/17 1130  INR 1.14    BMP: Recent Labs    10/16/17 1130 10/17/17 0328 10/18/17 0319 10/20/17 0431  NA 140 137 141 143  K 5.2* 3.8 4.2 4.2  CL 108 109 114* 116*  CO2 22 21* 23 22  GLUCOSE 134* 106* 106* 92  BUN 65* 40* 22 26*  CALCIUM 8.5* 8.0* 7.8* 8.0*  CREATININE 1.40* 1.31* 1.01 1.09  GFRNONAA 52* 57* >60 >60  GFRAA >60 >60 >60 >60    LIVER FUNCTION TESTS: Recent Labs    10/16/17 1130 10/18/17 0319  BILITOT 0.4 0.5  AST 19 19  ALT 12 12  ALKPHOS 50 37*  PROT 6.5 4.7*  ALBUMIN 3.2* 2.5*    TUMOR MARKERS: No results for input(s): AFPTM, CEA, CA199, CHROMGRNA in the last  8760 hours.  Assessment and Plan: GI Bleed Patient admitted with GI bleed.  Found to have duodenal ulcer by EGD 6/27. Received 1u PRBC 6/26, then 2 additional units PRBC 6/28.  Hgb has been stable around 9.0-9.2.  Now 8.2 this AM,  7.9 this afternoon.  SCr 1.09 No recent imaging.  Per GI, plan was to consult IR if signs of bleeding.  Patient has not had any BRBPR, no hemtaochezia.  Has been eaten small amounts of liquids with tolerance. Patient found in bed in soft restraints, some intermittent confusion this afternoon.  Was previously consenting for himself with only friends listed as emergency contacts in chart.  Discussed with Dr. Deanne Coffer who recommend continued monitoring.  If overt signs of bleeding or significant change in status, will plan to proceed with angiogram possible with emergent consent if remains confused.    Thank you for this interesting consult.  I greatly enjoyed meeting [name] and look forward to participating in their care.  A copy of this report was sent to the requesting provider on this date.  Electronically Signed: Hoyt Koch, PA 10/20/2017, 3:10 PM   I spent a total of 40 Minutes    in face to face in clinical consultation, greater than 50% of which was counseling/coordinating care for GI bleed.

## 2017-10-20 NOTE — Progress Notes (Signed)
[facility] Progress Note  Subjective: No specific complaints.  No signs of active GI bleeding but slight drop in hemoglobin.  Objective: Vital signs in last 24 hours: Temp:  [97.5 F (36.4 C)-98.6 F (37 C)] 98.2 F (36.8 C) (06/30 0800) Pulse Rate:  [97] 97 (06/30 0800) Resp:  [21-29] 26 (06/29 2300) BP: (102-136)/(60-67) 102/66 (06/29 2218) SpO2:  [93 %-100 %] 99 % (06/30 0400) Weight change:    PE:  No distress  Abdomen soft  Lab Results: Results for orders placed or performed during the hospital encounter of 10/16/17 (from the past 24 hour(s))  Hemoglobin and hematocrit, blood     Status: Abnormal   Collection Time: 10/19/17  4:25 PM  Result Value Ref Range   Hemoglobin 9.1 (L) 13.0 - 17.0 g/dL   HCT 00.5 (L) 11.0 - 21.1 %  CBC     Status: Abnormal   Collection Time: 10/20/17  4:31 AM  Result Value Ref Range   WBC 4.9 4.0 - 10.5 K/uL   RBC 2.53 (L) 4.22 - 5.81 MIL/uL   Hemoglobin 8.2 (L) 13.0 - 17.0 g/dL   HCT 17.3 (L) 56.7 - 01.4 %   MCV 97.2 78.0 - 100.0 fL   MCH 32.4 26.0 - 34.0 pg   MCHC 33.3 30.0 - 36.0 g/dL   RDW 10.3 (H) 01.3 - 14.3 %   Platelets 269 150 - 400 K/uL  Basic metabolic panel     Status: Abnormal   Collection Time: 10/20/17  4:31 AM  Result Value Ref Range   Sodium 143 135 - 145 mmol/L   Potassium 4.2 3.5 - 5.1 mmol/L   Chloride 116 (H) 98 - 111 mmol/L   CO2 22 22 - 32 mmol/L   Glucose, Bld 92 70 - 99 mg/dL   BUN 26 (H) 8 - 23 mg/dL   Creatinine, Ser 8.88 0.61 - 1.24 mg/dL   Calcium 8.0 (L) 8.9 - 10.3 mg/dL   GFR calc non Af Amer >60 >60 mL/min   GFR calc Af Amer >60 >60 mL/min   Anion gap 5 5 - 15    Studies/Results: No results found.    Assessment: Large duodenal bulb ulcer  Esophagitis  Alcohol withdrawal  Plan:   Continue PPI therapy.  If significant upper GI bleeding occurs again I was informed that we should call interventional radiology for embolization by Dr. Eustaquio Boyden 10/20/2017,  11:15 AM  Pager: [phone] If no answer or after 5 PM call [phone]

## 2017-10-21 ENCOUNTER — Inpatient Hospital Stay (HOSPITAL_COMMUNITY): Payer: Medicare HMO

## 2017-10-21 DIAGNOSIS — R410 Disorientation, unspecified: Secondary | ICD-10-CM

## 2017-10-21 HISTORY — PX: IR ANGIOGRAM SELECTIVE EACH ADDITIONAL VESSEL: IMG667

## 2017-10-21 HISTORY — PX: IR EMBO ART  VEN HEMORR LYMPH EXTRAV  INC GUIDE ROADMAPPING: IMG5450

## 2017-10-21 HISTORY — PX: IR US GUIDE VASC ACCESS RIGHT: IMG2390

## 2017-10-21 HISTORY — PX: IR ANGIOGRAM VISCERAL SELECTIVE: IMG657

## 2017-10-21 LAB — BASIC METABOLIC PANEL
Anion gap: 6 (ref 5–15)
BUN: 44 mg/dL — ABNORMAL HIGH (ref 8–23)
CO2: 21 mmol/L — ABNORMAL LOW (ref 22–32)
Calcium: 8 mg/dL — ABNORMAL LOW (ref 8.9–10.3)
Chloride: 117 mmol/L — ABNORMAL HIGH (ref 98–111)
Creatinine, Ser: 1.42 mg/dL — ABNORMAL HIGH (ref 0.61–1.24)
GFR calc Af Amer: 60 mL/min — ABNORMAL LOW (ref >60)
GFR calc non Af Amer: 51 mL/min — ABNORMAL LOW (ref >60)
Glucose, Bld: 108 mg/dL — ABNORMAL HIGH (ref 70–99)
Potassium: 4.1 mmol/L (ref 3.5–5.1)
Sodium: 144 mmol/L (ref 135–145)

## 2017-10-21 LAB — CBC
HCT: 21 % — ABNORMAL LOW (ref 39.0–52.0)
Hemoglobin: 6.8 g/dL — CL (ref 13.0–17.0)
MCH: 31.9 pg (ref 26.0–34.0)
MCHC: 32.4 g/dL (ref 30.0–36.0)
MCV: 98.6 fL (ref 78.0–100.0)
Platelets: 314 10*3/uL (ref 150–400)
RBC: 2.13 MIL/uL — ABNORMAL LOW (ref 4.22–5.81)
RDW: 16.2 % — ABNORMAL HIGH (ref 11.5–15.5)
WBC: 5.5 10*3/uL (ref 4.0–10.5)

## 2017-10-21 LAB — PREPARE RBC (CROSSMATCH)

## 2017-10-21 LAB — HEMOGLOBIN AND HEMATOCRIT, BLOOD
HCT: 26.8 % — ABNORMAL LOW (ref 39.0–52.0)
Hemoglobin: 9 g/dL — ABNORMAL LOW (ref 13.0–17.0)

## 2017-10-21 MED ORDER — SODIUM CHLORIDE 0.9% IV SOLUTION: Freq: Once | INTRAVENOUS | Status: DC

## 2017-10-21 MED ORDER — LIDOCAINE HCL 1 % IJ SOLN
INTRAMUSCULAR | Status: AC
  Filled 2017-10-21: qty 20

## 2017-10-21 MED ORDER — IOPAMIDOL (ISOVUE-300) INJECTION 61%
100.0000 mL | Freq: Once | INTRAVENOUS | Status: AC | PRN
  Administered 2017-10-21: 60 mL via INTRA_ARTERIAL

## 2017-10-21 MED ORDER — FENTANYL CITRATE (PF) 100 MCG/2ML IJ SOLN
INTRAMUSCULAR | Status: AC
  Filled 2017-10-21: qty 4

## 2017-10-21 MED ORDER — FENTANYL CITRATE (PF) 100 MCG/2ML IJ SOLN
INTRAMUSCULAR | Status: AC | PRN
  Administered 2017-10-21: 50 ug via INTRAVENOUS

## 2017-10-21 MED ORDER — MIDAZOLAM HCL 2 MG/2ML IJ SOLN
INTRAMUSCULAR | Status: AC | PRN
  Administered 2017-10-21: 0.5 mg via INTRAVENOUS
  Administered 2017-10-21: 1 mg via INTRAVENOUS
  Administered 2017-10-21: 0.5 mg via INTRAVENOUS

## 2017-10-21 MED ORDER — SODIUM CHLORIDE 0.9% IV SOLUTION
Freq: Once | INTRAVENOUS | Status: AC
  Administered 2017-10-21: 08:00:00 via INTRAVENOUS

## 2017-10-21 MED ORDER — MIDAZOLAM HCL 2 MG/2ML IJ SOLN
INTRAMUSCULAR | Status: AC
  Filled 2017-10-21: qty 4

## 2017-10-21 MED ORDER — IOPAMIDOL (ISOVUE-300) INJECTION 61%
INTRAVENOUS | Status: AC
  Administered 2017-10-21: 60 mL via INTRA_ARTERIAL
  Filled 2017-10-21: qty 150

## 2017-10-21 NOTE — Procedures (Signed)
Interventional Radiology Procedure Note  Procedure: US guided access right CFA, with mesenteric angiogram, and empiric embolization of the GDA to the origin for acute upper GI hemorrhage.   Exoseal device at the right CFA for hemostasis Complications: None Recommendations:  - Right leg straight for 4 hours - Routine wound care - Serial H&H - Do not submerge for 7 days - If ooze at the right hip, pressure for 15 minutes and add compression bandage  Signed,  Yvone Neu. Loreta Ave, DO

## 2017-10-21 NOTE — Progress Notes (Addendum)
Subjective: Patient was seen and examined at bedside. Not following commands appropriately, is on soft restraints/Mittens. No melena or hematochezia reported, no BMs since admission to ICU.   Objective: Vital signs in last 24 hours: Temp:  [98 F (36.7 C)-100.4 F (38 C)] 98.8 F (37.1 C) (07/01 0902) Pulse Rate:  [87-101] 98 (07/01 0902) Resp:  [19-36] 19 (07/01 0845) BP: (84-112)/(55-89) 105/70 (07/01 0902) SpO2:  [87 %-100 %] 100 % (07/01 0845) Weight change:  Last BM Date: 10/16/17  WU:[id number] pallor,no icterus GENERAL:not in distress ABDOMEN:soft, non distended, sluggish bowel sounds EXTREMITIES:no deformity  Lab Results: Results for orders placed or performed during the hospital encounter of 10/16/17 (from the past 48 hour(s))  Hemoglobin and hematocrit, blood     Status: Abnormal   Collection Time: 10/19/17  4:25 PM  Result Value Ref Range   Hemoglobin 9.1 (L) 13.0 - 17.0 g/dL   HCT 26.8 (L) 39.0 - 52.0 %    Comment: Performed at [facility], [place] [address] [place], [place] [address]  CBC     Status: Abnormal   Collection Time: 10/20/17  4:31 AM  Result Value Ref Range   WBC 4.9 4.0 - 10.5 K/uL   RBC 2.53 (L) 4.22 - 5.81 MIL/uL   Hemoglobin 8.2 (L) 13.0 - 17.0 g/dL   HCT 24.6 (L) 39.0 - 52.0 %   MCV 97.2 78.0 - 100.0 fL   MCH 32.4 26.0 - 34.0 pg   MCHC 33.3 30.0 - 36.0 g/dL   RDW 15.7 (H) 11.5 - 15.5 %   Platelets 269 150 - 400 K/uL    Comment: Performed at [facility], [place] [address] [place], [place] [address]  Basic metabolic panel     Status: Abnormal   Collection Time: 10/20/17  4:31 AM  Result Value Ref Range   Sodium 143 135 - 145 mmol/L   Potassium 4.2 3.5 - 5.1 mmol/L   Chloride 116 (H) 98 - 111 mmol/L    Comment: Please note change in reference range.   CO2 22 22 - 32 mmol/L   Glucose, Bld 92 70 - 99 mg/dL    Comment: Please note change in reference range.   BUN 26 (H) 8 - 23 mg/dL    Comment: Please  note change in reference range.   Creatinine, Ser 1.09 0.61 - 1.24 mg/dL   Calcium 8.0 (L) 8.9 - 10.3 mg/dL   GFR calc non Af Amer >60 >60 mL/min   GFR calc Af Amer >60 >60 mL/min    Comment: (NOTE) The eGFR has been calculated using the CKD EPI equation. This calculation has not been validated in all clinical situations. eGFR's persistently <60 mL/min signify possible Chronic Kidney Disease.    Anion gap 5 5 - 15    Comment: Performed at [facility], [place] [address], [place], [place] [address]  Hemoglobin and hematocrit, blood     Status: Abnormal   Collection Time: 10/20/17 12:12 PM  Result Value Ref Range   Hemoglobin 7.9 (L) 13.0 - 17.0 g/dL   HCT 24.0 (L) 39.0 - 52.0 %    Comment: Performed at [facility], [place] [address], [place], [place] [address]  CBC     Status: Abnormal   Collection Time: 10/21/17  4:56 AM  Result Value Ref Range   WBC 5.5 4.0 - 10.5 K/uL   RBC 2.13 (L) 4.22 - 5.81 MIL/uL   Hemoglobin 6.8 (LL) 13.0 - 17.0 g/dL  Comment: CRITICAL RESULT CALLED TO, READ BACK BY AND VERIFIED WITH: MCINTOSH, R. RN _0  ON 7.1.19 BY NMCCOY    HCT 21.0 (L) 39.0 - 52.0 %   MCV 98.6 78.0 - 100.0 fL   MCH 31.9 26.0 - 34.0 pg   MCHC 32.4 30.0 - 36.0 g/dL   RDW 16.2 (H) 11.5 - 15.5 %   Platelets 314 150 - 400 K/uL    Comment: Performed at [facility], [place] [address], [place], [place] [address]  Basic metabolic panel     Status: Abnormal   Collection Time: 10/21/17  4:56 AM  Result Value Ref Range   Sodium 144 135 - 145 mmol/L   Potassium 4.1 3.5 - 5.1 mmol/L   Chloride 117 (H) 98 - 111 mmol/L    Comment: Please note change in reference range.   CO2 21 (L) 22 - 32 mmol/L   Glucose, Bld 108 (H) 70 - 99 mg/dL    Comment: Please note change in reference range.   BUN 44 (H) 8 - 23 mg/dL    Comment: Please note change in reference range.   Creatinine, Ser 1.42 (H) 0.61 - 1.24 mg/dL   Calcium 8.0 (L) 8.9 - 10.3 mg/dL    GFR calc non Af Amer 51 (L) >60 mL/min   GFR calc Af Amer 60 (L) >60 mL/min    Comment: (NOTE) The eGFR has been calculated using the CKD EPI equation. This calculation has not been validated in all clinical situations. eGFR's persistently <60 mL/min signify possible Chronic Kidney Disease.    Anion gap 6 5 - 15    Comment: Performed at [facility], [place] [address] [place], [place] [address]  Prepare RBC     Status: None   Collection Time: 10/21/17  7:18 AM  Result Value Ref Range   Order Confirmation      ORDER PROCESSED BY BLOOD BANK Performed at [facility], [place] [address] [place], [place] [address]   Prepare RBC     Status: None   Collection Time: 10/21/17  7:18 AM  Result Value Ref Range   Order Confirmation      ORDER PROCESSED BY BLOOD BANK Performed at [facility], [place] [address], [place], [place] [address]   Type and screen [place]     Status: None (Preliminary result)   Collection Time: 10/21/17  7:25 AM  Result Value Ref Range   ABO/RH(D) A POS    Antibody Screen NEG    Sample Expiration 10/24/2017    Unit Number [id number]    Blood Component Type RED CELLS,LR    Unit division 00    Status of Unit ISSUED    Transfusion Status OK TO TRANSFUSE    Crossmatch Result      Compatible Performed at [facility], [place] [address] [place], [place] [address]    Unit Number [id number]    Blood Component Type RED CELLS,LR    Unit division 00    Status of Unit ALLOCATED    Transfusion Status OK TO TRANSFUSE    Crossmatch Result Compatible     Studies/Results: No results found.  Medications: I have reviewed the patient's current medications.  Assessment: 1.Large, cratered duodenal bulb ulcer not amenable to endoscopic therapy. 2.Elevated BUN/creatinine ratio of 44/1.42( worsened from yesterday)along with drop in hemoglobin( 9.1/8.2), 7.9 yesterday and 6.8  today. 3.History of alcohol abuse.  Plan: Was seen by interventional radiology services  on 10/20/17 with plans for continued monitoring and if overt signs of bleeding or significant change in status was noted, the plan was to proceed with angiogram. Drop in hemoglobin does not appear to be related to hemodilution, as he only has 317 ml of positive fluid balance. On pantoprazole 40 mg IV every 12 hours. Plan is to transfuse 2 units PRBC today. Discussed with his nurse at bedside to notify interventional radiology services regarding drop in hemoglobin,will need reevaluation for embolization of a large cratered duodenal bulb ulcer.  Monitoring for alcohol withdrawal, continued on thiamine,multivitamin and folic acid. On IV lorazepam as needed for alcohol withdrawal protocol.   Discussed with Dr.Yamagata over the phone, patient will be evaluated for possible embolization of GDA today afternoon or tomorrow.  Ronnette Juniper 10/21/2017, 9:55 AM   Pager [phone] If no answer or after 5 PM call [phone]

## 2017-10-21 NOTE — Progress Notes (Signed)
PATIENT RETURNED FROM IR PROCEDURE ON OWN BED, ATACHED TO ROOM MONITOR; VS OBATINED.  EXOSEAL CLOSURE DEVICE IN INTACT, NO HEMATOMA, BLEEDING NOTED. FEMORAL AND PEDAL PULSED NOTED. CIWA SCORE 20; SEE MAR.

## 2017-10-21 NOTE — Progress Notes (Signed)
OT Cancellation Note  Patient Details Name: [name] MRN: [id number] DOB: 30-Jan-1956   Cancelled Treatment:    Reason Eval/Treat Not Completed: Patient not medically ready. Pt to get blood this day- will check on later in day or next day. Thanks, Lise Auer, [place] [phone]  Einar Crow D 10/21/2017, 4:11 PM

## 2017-10-21 NOTE — Progress Notes (Signed)
CRITICAL VALUE ALERT  Critical Value:  Hgb 6.8    Date & Time Notied:  10/21/2017  0550   Provider Notified: X. Blount  Orders Received/Actions taken: Transfuse 2 units PRBCs

## 2017-10-21 NOTE — Sedation Documentation (Signed)
exoseal closure device used at right groin site.

## 2017-10-21 NOTE — Progress Notes (Signed)
PROGRESS NOTE    [name]  UXL:[id number] DOB: [birth year] DOA: 10/16/2017 PCP: Rinaldo Cloud, MD   Brief Narrative:  HPI on 10/16/2017 by Dr. Latrelle Dodrill [name] is a 62 y.o. male with medical history significant of MS, bipolar disorder, alcohol and tobacco abuse, presented to the emergency department complaining of abdominal pain with nausea and vomiting.  Patient is poor historian.  He reports to 2-3 days of abdominal pain associated with bloody nausea and vomiting.  Patient reported epigastric pain started approximately 2 weeks ago which has been worse with eating.  Denies diarrhea, constipation and fever or chills.  Last alcohol use 3 days ago.  Normally drinks occasionally  but can drink up to 12 pack a day.  Other associated symptoms include weakness/faint.  Denies NSAID/ASA use, no history of GI bleeding in the past.  Patient denies dark stools.  Interim history Admitted for symptomatic anemia and GI bleed. GI consulted, s/p EGD. Given several transfusions. Also having delirium, possibly from alcohol withdrawal. IR consulted as patient continues to have anemia.   Assessment & Plan   Symptomatic anemia/GI bleed -Presented with weakness, palpitations and soft blood pressure -Chronic use of alcohol; denies use of NSAIDs -Hemoglobin approximately 14-15, on admission was 10.1 -FOBT positive -Was on Protonix drip for 48 hours, and transitioned to IV BID -Anemia panel showed an iron of 77, ferritin of 122, vitamin B12 180, folate 11.4 -Given that vitamin B12 is on the low normal side, dose of B12 given -Gastroenterology consulted and appreciated, status post EGD: Showed very large, deep and created duodenal bulb/duodenal sweep ulcer with pigmented spots.  LA grade D esophagitis -Gastroenterology recommended to transfuse and keep hemoglobin around 8.  If evidence of rebleeding recommended IR consult for empiric embolization of GDA. -no episodes of melena or hematochezia -has  received 3u PRBCs during this hospitalization -Hemoglobin now 6.8 -given continued drop in hemoglobin, Interventional radiology consulted and appreicated -will give 2u PRBC today -Continue to monitor CBC  Acute kidney injury with mild hyperkalemia -Creatinine on admission 1.4, currently 1.42 (possible due to soft BP and blood loss) -Baseline creatinine approximately 0.9-1 -will give [facility] today and monitor BMP -hyperkalemia resolved  Multiple sclerosis -Neurology, Dr. Epimenio Foot -Stable, continue tecfidera  Bipolar disorder -Continue Abilify, Wellbutrin, Lexapro, lithium  Alcohol abuse with acute delirium -Continue CIWA- changed to stepdown protocol -Continue q6PRN and seroquel QHS -mental status waxes and wanes  Tremors -possibly related to MS vs alcohol withdrawal -Continue to monitor   Tobacco abuse -Nicotine cessation discussed, declined nicotine patch  Deconditioning  -will consult PT and OT  DVT Prophylaxis  SCDs  Code Status: Full  Family Communication: None at bedside  Disposition Plan: Admitted. Pending further recommendations from GI/IR and stabilization of hemoglobin.   Consultants Gastroenterology Interventional radiology  Procedures  EGD  Antibiotics   Anti-infectives (From admission, onward)   None      Subjective:   [name] seen and examined today. Per RN, just received Ativan. Currently not interactive and difficult to arouse. Opens eyes.  Objective:   Vitals:   10/21/17 0745 10/21/17 0845 10/21/17 0847 10/21/17 0902  BP:  112/89  105/70  Pulse:  (!) 101  98  Resp:  19    Temp: 98 F (36.7 C)  98.7 F (37.1 C) 98.8 F (37.1 C)  TempSrc: Axillary Oral Axillary Axillary  SpO2:  100%    Weight:      Height:        Intake/Output Summary (  Last 24 hours) at 10/21/2017 0924 Last data filed at 10/21/2017 0902 Gross per 24 hour  Intake 296 ml  Output 850 ml  Net -554 ml   Filed Weights   10/17/17 0859  Weight: 80.1 kg (176 lb 9.4  oz)   Exam  General: Well developed, chronically ill appearing, NAD  HEENT: NCAT, mucous membranes moist.   Neck: Supple  Cardiovascular: S1 S2 auscultated, RRR, no murmur  Respiratory: Clear to auscultation bilaterally with equal chest rise  Abdomen: Soft, nontender, nondistended, + bowel sounds  Extremities: warm dry without cyanosis clubbing or edema  Neuro: Not easily arousable  Psych: Cannot assess    Data Reviewed: I have personally reviewed following labs and imaging studies  CBC: Recent Labs  Lab 10/17/17 1103 10/18/17 0319  10/19/17 0544 10/19/17 1625 10/20/17 0431 10/20/17 1212 10/21/17 0456  WBC 4.7 5.5  --  4.4  --  4.9  --  5.5  HGB 8.2* 7.1*   < > 9.0* 9.1* 8.2* 7.9* 6.8*  HCT 24.6* 21.8*   < > 26.9* 26.8* 24.6* 24.0* 21.0*  MCV 96.9 97.8  --  95.1  --  97.2  --  98.6  PLT 210 214  --  231  --  269  --  314   < > = values in this interval not displayed.   Basic Metabolic Panel: Recent Labs  Lab 10/16/17 1130 10/17/17 0328 10/18/17 0319 10/20/17 0431 10/21/17 0456  NA 140 137 141 143 144  K 5.2* 3.8 4.2 4.2 4.1  CL 108 109 114* 116* 117*  CO2 22 21* 23 22 21*  GLUCOSE 134* 106* 106* 92 108*  BUN 65* 40* 22 26* 44*  CREATININE 1.40* 1.31* 1.01 1.09 1.42*  CALCIUM 8.5* 8.0* 7.8* 8.0* 8.0*  MG  --  2.1  --   --   --    GFR: Estimated Creatinine Clearance: 57.4 mL/min (A) (by C-G formula based on SCr of 1.42 mg/dL (H)). Liver Function Tests: Recent Labs  Lab 10/16/17 1130 10/18/17 0319  AST 19 19  ALT 12 12  ALKPHOS 50 37*  BILITOT 0.4 0.5  PROT 6.5 4.7*  ALBUMIN 3.2* 2.5*   No results for input(s): LIPASE, AMYLASE in the last 168 hours. No results for input(s): AMMONIA in the last 168 hours. Coagulation Profile: Recent Labs  Lab 10/16/17 1130  INR 1.14   Cardiac Enzymes: No results for input(s): CKTOTAL, CKMB, CKMBINDEX, TROPONINI in the last 168 hours. BNP (last 3 results) No results for input(s): PROBNP in the last 8760  hours. HbA1C: No results for input(s): HGBA1C in the last 72 hours. CBG: No results for input(s): GLUCAP in the last 168 hours. Lipid Profile: No results for input(s): CHOL, HDL, LDLCALC, TRIG, CHOLHDL, LDLDIRECT in the last 72 hours. Thyroid Function Tests: No results for input(s): TSH, T4TOTAL, FREET4, T3FREE, THYROIDAB in the last 72 hours. Anemia Panel: No results for input(s): VITAMINB12, FOLATE, FERRITIN, TIBC, IRON, RETICCTPCT in the last 72 hours. Urine analysis:    Component Value Date/Time   COLORURINE YELLOW 09/24/2016 0924   APPEARANCEUR CLEAR 09/24/2016 0924   LABSPEC 1.009 09/24/2016 0924   PHURINE 7.0 09/24/2016 0924   GLUCOSEU NEGATIVE 09/24/2016 0924   HGBUR NEGATIVE 09/24/2016 0924   BILIRUBINUR NEGATIVE 09/24/2016 0924   KETONESUR NEGATIVE 09/24/2016 0924   PROTEINUR NEGATIVE 09/24/2016 0924   UROBILINOGEN 0.2 07/08/2013 0930   NITRITE NEGATIVE 09/24/2016 0924   LEUKOCYTESUR NEGATIVE 09/24/2016 0924   Sepsis Labs: @LABRCNTIP (procalcitonin:4,lacticidven:4)  )  Recent Results (from the past 240 hour(s))  MRSA PCR Screening     Status: None   Collection Time: 10/16/17  3:21 PM  Result Value Ref Range Status   MRSA by PCR NEGATIVE NEGATIVE Final    Comment:        The GeneXpert MRSA Assay (FDA approved for NASAL specimens only), is one component of a comprehensive MRSA colonization surveillance program. It is not intended to diagnose MRSA infection nor to guide or monitor treatment for MRSA infections. Performed at [facility], [address], [place], [place] [address]       Radiology Studies: No results found.   Scheduled Meds: . sodium chloride   Intravenous Once  . ARIPiprazole  5 mg Oral Daily  . buPROPion  300 mg Oral Daily  . Dimethyl Fumarate  240 mg Oral BID  . escitalopram  20 mg Oral Daily  . folic acid  1 mg Oral Daily  . lithium carbonate  450 mg Oral BID  . multivitamin with minerals  1 tablet Oral Daily    . pantoprazole (PROTONIX) IV  40 mg Intravenous Q12H  . QUEtiapine  25 mg Oral QHS  . sodium chloride flush  3 mL Intravenous Q12H  . thiamine  100 mg Oral Daily   Or  . thiamine  100 mg Intravenous Daily   Continuous Infusions:    LOS: 3 days   Time Spent in minutes   45 minutes  Dasie Chancellor D.O. on 10/21/2017 at 9:24 AM  Between 7am to 7pm - Pager - [phone]  After 7pm go to [email or web address] - password [email or web address]  And look for the night coverage person covering for me after hours  [facility] Office  [phone]

## 2017-10-21 NOTE — Progress Notes (Signed)
PT Cancellation Note  Patient Details Name: AHMARION GOGOL MRN: [id number] DOB: 10/24/1955   Cancelled Treatment:    Reason Eval/Treat Not Completed: Medical issues which prohibited therapy. Patient to receive @ units of blood will check back another time after completed.    Sharen Heck PT [phone]  10/21/2017, 8:18 AM

## 2017-10-21 NOTE — Progress Notes (Signed)
Patient left for IR department in bed on room monitor with transport and charge RN. No significant distress noted.

## 2017-10-22 ENCOUNTER — Encounter (HOSPITAL_COMMUNITY): Payer: Self-pay | Admitting: Interventional Radiology

## 2017-10-22 LAB — BASIC METABOLIC PANEL
Anion gap: 9 (ref 5–15)
BUN: 34 mg/dL — ABNORMAL HIGH (ref 8–23)
CO2: 19 mmol/L — ABNORMAL LOW (ref 22–32)
Calcium: 8.2 mg/dL — ABNORMAL LOW (ref 8.9–10.3)
Chloride: 119 mmol/L — ABNORMAL HIGH (ref 98–111)
Creatinine, Ser: 1.32 mg/dL — ABNORMAL HIGH (ref 0.61–1.24)
GFR calc Af Amer: >60 mL/min (ref >60)
GFR calc non Af Amer: 56 mL/min — ABNORMAL LOW (ref >60)
Glucose, Bld: 96 mg/dL (ref 70–99)
Potassium: 4.2 mmol/L (ref 3.5–5.1)
Sodium: 147 mmol/L — ABNORMAL HIGH (ref 135–145)

## 2017-10-22 LAB — CBC
HCT: 29.5 % — ABNORMAL LOW (ref 39.0–52.0)
HCT: 28.3 % — ABNORMAL LOW (ref 39.0–52.0)
Hemoglobin: 9.4 g/dL — ABNORMAL LOW (ref 13.0–17.0)
Hemoglobin: 9.4 g/dL — ABNORMAL LOW (ref 13.0–17.0)
MCH: 30.9 pg (ref 26.0–34.0)
MCH: 32.2 pg (ref 26.0–34.0)
MCHC: 31.9 g/dL (ref 30.0–36.0)
MCHC: 33.2 g/dL (ref 30.0–36.0)
MCV: 97 fL (ref 78.0–100.0)
MCV: 96.9 fL (ref 78.0–100.0)
Platelets: 311 10*3/uL (ref 150–400)
Platelets: 334 10*3/uL (ref 150–400)
RBC: 3.04 MIL/uL — ABNORMAL LOW (ref 4.22–5.81)
RBC: 2.92 MIL/uL — ABNORMAL LOW (ref 4.22–5.81)
RDW: 16.4 % — ABNORMAL HIGH (ref 11.5–15.5)
RDW: 16.4 % — ABNORMAL HIGH (ref 11.5–15.5)
WBC: 7.8 10*3/uL (ref 4.0–10.5)
WBC: 8.3 10*3/uL (ref 4.0–10.5)

## 2017-10-22 MED ORDER — DEXTROSE-NACL 5-0.45 % IV SOLN
INTRAVENOUS | Status: DC
  Administered 2017-10-22: via INTRAVENOUS

## 2017-10-22 MED ORDER — M.V.I. ADULT IV INJ
Freq: Once | INTRAVENOUS | Status: AC
  Administered 2017-10-22: 13:00:00 via INTRAVENOUS
  Filled 2017-10-22: qty 10

## 2017-10-22 MED ORDER — THIAMINE HCL 100 MG/ML IJ SOLN
Freq: Once | INTRAVENOUS | Status: DC
  Filled 2017-10-22: qty 1000

## 2017-10-22 MED ORDER — FOLIC ACID 5 MG/ML IJ SOLN
1.0000 mg | Freq: Every day | INTRAMUSCULAR | Status: DC
  Administered 2017-10-22 – 2017-10-24 (×3): 1 mg via INTRAVENOUS
  Filled 2017-10-22 (×4): qty 0.2

## 2017-10-22 MED ORDER — SODIUM CHLORIDE 0.9 % IV SOLN: INTRAVENOUS | Status: DC

## 2017-10-22 NOTE — Progress Notes (Signed)
Pharmacy aware of multivitamin adult (MVI-12) 10mL in D5LR 1,000 mL infusion entered incorrectly into the computer. Ordered to infuse 100 mL/hr, however computer is showing "requires stopped action" as if it were a bolus. Per Pharmacy, continue to infuse as directed at 100 mL/hr and stop infusing and document in the [facility] at 10/23/17 @ 0100.

## 2017-10-22 NOTE — Progress Notes (Signed)
Referring Physician(s): Burnard Bunting  Supervising Physician: Jolaine Click  Patient Status:  [facility] - In-pt  Chief Complaint:  Abdominal pain  Subjective: Pt resting quietly; no acute changes; no bleeding reported per nursing   Allergies: Patient has no known allergies.  Medications: Prior to Admission medications   Medication Sig Start Date End Date Taking? Authorizing Provider  Dimethyl Fumarate (TECFIDERA) 240 MG CPDR Take 1 capsule (240 mg total) by mouth 2 (two) times daily. 07/31/17  Yes Sater, Pearletha Furl, MD  escitalopram (LEXAPRO) 20 MG tablet Take 1 tablet (20 mg total) by mouth daily. For depression 09/25/12  Yes Thermon Leyland, NP  lithium carbonate (ESKALITH) 450 MG CR tablet Take 450 mg by mouth 2 (two) times daily. 04/04/16  Yes [provider]  ARIPiprazole (ABILIFY) 5 MG tablet Take 5 mg by mouth daily.  09/29/16   [provider]  buPROPion (WELLBUTRIN XL) 300 MG 24 hr tablet Take 1 tablet (300 mg total) by mouth daily. For depression Patient not taking: Reported on 10/16/2017 09/25/12   Thermon Leyland, NP     Vital Signs: BP 140/62   Pulse 94   Temp 98.5 F (36.9 C) (Oral)   Resp (!) 23   Ht 5\' 11"  (1.803 m)   Wt 176 lb 9.4 oz (80.1 kg)   SpO2 98%   BMI 24.63 kg/m   Physical Exam awake, not very interactive; rt CFA site soft, clean, dry,NT, no hematoma; intact distal pulses  Imaging: Ir Angiogram Visceral Selective  Result Date: 10/22/2017 INDICATION: 62 year old male with a history of acute, emergent upper GI bleed, with endoscopy demonstrating duodenal ulcer EXAM: SELECTIVE VISCERAL ARTERIOGRAPHY; IR ULTRASOUND GUIDANCE VASC ACCESS RIGHT; ADDITIONAL ARTERIOGRAPHY; IR EMBO ART VEN HEMORR LYMPH EXTRAV INC GUIDE ROADMAPPING MEDICATIONS: None ANESTHESIA/SEDATION: Moderate (conscious) sedation was employed during this procedure. A total of Versed 2.0 mg and Fentanyl 100 mcg was administered intravenously. Moderate Sedation Time: 40 minutes. The  patient's level of consciousness and vital signs were monitored continuously by radiology nursing throughout the procedure under my direct supervision. CONTRAST:  60 cc Isovue FLUOROSCOPY TIME:  Fluoroscopy Time: 9 minutes 42 seconds (346 mGy). COMPLICATIONS: None PROCEDURE: Informed consent was obtained from the patient following explanation of the procedure, risks, benefits and alternatives. The patient understands, agrees and consents for the procedure. All questions were addressed. A time out was performed prior to the initiation of the procedure. Maximal barrier sterile technique utilized including caps, mask, sterile gowns, sterile gloves, large sterile drape, hand hygiene, and Betadine prep. Ultrasound survey of the right inguinal region was performed with images stored and sent to PACs, confirming patency of the vessel. A micropuncture needle was used access the right common femoral artery under ultrasound. With excellent arterial blood flow returned, and an .018 micro wire was passed through the needle, observed enter the abdominal aorta under fluoroscopy. The needle was removed, and a micropuncture sheath was placed over the wire. The inner dilator and wire were removed, and an 035 Bentson wire was advanced under fluoroscopy into the abdominal aorta. The sheath was removed and a standard 5 [place] vascular sheath was placed. The dilator was removed and the sheath was flushed. C2 Cobra catheter was advanced over the Bentson wire to the aorta. Catheter was use elect the celiac artery. Angiogram was performed. Glidewire was used to navigate the [place] catheter into the common hepatic artery. Angiogram was performed. Microcatheter was then used to navigate a microwire into the gastroduodenal artery. Angiogram was performed.  Empiric embolization was then performed with a coil mass, coiling the gastroduodenal artery to the origin. Catheters and wires were removed. Exoseal was deployed at the right common femoral  artery for hemostasis. Patient tolerated the procedure well and remained hemodynamically stable throughout. No complications were encountered and no significant blood loss. FINDINGS: Ultrasound demonstrates patency the right common femoral artery. Angiogram of the celiac artery demonstrates typical anatomy, with the celiac artery contributing to splenic artery and common hepatic artery. Gastroduodenal artery arises from the common hepatic artery. Angiogram of the gastroduodenal artery demonstrates no tumor blush, extravasation, pseudoaneurysm. Duodenal branches originate from the mid segment of the descending gastroduodenal artery. Gastroepiploic artery is patent. Status post coil embolization there is no flow into the branches of the gastroduodenal artery, with slight filling at the stump and no collateral flow to the mid segment. IMPRESSION: Status post ultrasound guided access right common femoral artery for mesenteric angiogram and empiric embolization of gastroduodenal artery, as a target contributing to emergent, life-threatening upper GI hemorrhage. Status post Exoseal deployment for hemostasis. Signed, Yvone Neu. Reyne Dumas, RPVI [facility] Radiology Electronically Signed   By: Gilmer Mor D.O.   On: 10/22/2017 09:02   Ir Angiogram Selective Each Additional Vessel  Result Date: 10/22/2017 INDICATION: 62 year old male with a history of acute, emergent upper GI bleed, with endoscopy demonstrating duodenal ulcer EXAM: SELECTIVE VISCERAL ARTERIOGRAPHY; IR ULTRASOUND GUIDANCE VASC ACCESS RIGHT; ADDITIONAL ARTERIOGRAPHY; IR EMBO ART VEN HEMORR LYMPH EXTRAV INC GUIDE ROADMAPPING MEDICATIONS: None ANESTHESIA/SEDATION: Moderate (conscious) sedation was employed during this procedure. A total of Versed 2.0 mg and Fentanyl 100 mcg was administered intravenously. Moderate Sedation Time: 40 minutes. The patient's level of consciousness and vital signs were monitored  continuously by radiology nursing throughout the procedure under my direct supervision. CONTRAST:  60 cc Isovue FLUOROSCOPY TIME:  Fluoroscopy Time: 9 minutes 42 seconds (346 mGy). COMPLICATIONS: None PROCEDURE: Informed consent was obtained from the patient following explanation of the procedure, risks, benefits and alternatives. The patient understands, agrees and consents for the procedure. All questions were addressed. A time out was performed prior to the initiation of the procedure. Maximal barrier sterile technique utilized including caps, mask, sterile gowns, sterile gloves, large sterile drape, hand hygiene, and Betadine prep. Ultrasound survey of the right inguinal region was performed with images stored and sent to PACs, confirming patency of the vessel. A micropuncture needle was used access the right common femoral artery under ultrasound. With excellent arterial blood flow returned, and an .018 micro wire was passed through the needle, observed enter the abdominal aorta under fluoroscopy. The needle was removed, and a micropuncture sheath was placed over the wire. The inner dilator and wire were removed, and an 035 Bentson wire was advanced under fluoroscopy into the abdominal aorta. The sheath was removed and a standard 5 [place] vascular sheath was placed. The dilator was removed and the sheath was flushed. C2 Cobra catheter was advanced over the [place]ents[place] wire to the aorta. Catheter was use elect the celiac artery. Angiogram was performed. Glidewire was used to navigate the [place] catheter into the common hepatic artery. Angiogram was performed. Microcatheter was then used to navigate a microwire into the gastroduodenal artery. Angiogram was performed. Empiric embolization was then performed with a coil mass, coiling the gastroduodenal artery to the origin. Catheters and wires were removed. Exoseal was deployed at the right common femoral artery for hemostasis. Patient tolerated the procedure well and  remained hemodynamically stable throughout. No complications were  encountered and no significant blood loss. FINDINGS: Ultrasound demonstrates patency the right common femoral artery. Angiogram of the celiac artery demonstrates typical anatomy, with the celiac artery contributing to splenic artery and common hepatic artery. Gastroduodenal artery arises from the common hepatic artery. Angiogram of the gastroduodenal artery demonstrates no tumor blush, extravasation, pseudoaneurysm. Duodenal branches originate from the mid segment of the descending gastroduodenal artery. Gastroepiploic artery is patent. Status post coil embolization there is no flow into the branches of the gastroduodenal artery, with slight filling at the stump and no collateral flow to the mid segment. IMPRESSION: Status post ultrasound guided access right common femoral artery for mesenteric angiogram and empiric embolization of gastroduodenal artery, as a target contributing to emergent, life-threatening upper GI hemorrhage. Status post Exoseal deployment for hemostasis. Signed, Yvone Neu. Reyne Dumas, RPVI [facility] Inter[facility] Radiology Specialists [facility] Radiology Electronically Signed   By: Gilmer Mor D.O.   On: 10/22/2017 09:02   Ir Angiogram Selective Each Additional Vessel  Result Date: 10/22/2017 INDICATION: 62 year old male with a history of acute, emergent upper GI bleed, with endoscopy demonstrating duodenal ulcer EXAM: SELECTIVE VISCERAL ARTERIOGRAPHY; IR ULTRASOUND GUIDANCE VASC ACCESS RIGHT; ADDITIONAL ARTERIOGRAPHY; IR EMBO ART VEN HEMORR LYMPH EXTRAV INC GUIDE ROADMAPPING MEDICATIONS: None ANESTHESIA/SEDATION: Moderate (conscious) sedation was employed during this procedure. A total of Versed 2.0 mg and Fentanyl 100 mcg was administered intravenously. Moderate Sedation Time: 40 minutes. The patient's level of consciousness and vital signs were monitored continuously by radiology nursing throughout the procedure under  my direct supervision. CONTRAST:  60 cc Isovue FLUOROSCOPY TIME:  Fluoroscopy Time: 9 minutes 42 seconds (346 mGy). COMPLICATIONS: None PROCEDURE: Informed consent was obtained from the patient following explanation of the procedure, risks, benefits and alternatives. The patient understands, agrees and consents for the procedure. All questions were addressed. A time out was performed prior to the initiation of the procedure. Maximal barrier sterile technique utilized including caps, mask, sterile gowns, sterile gloves, large sterile drape, hand hygiene, and Betadine prep. Ultrasound survey of the right inguinal region was performed with images stored and sent to PACs, confirming patency of the vessel. A micropuncture needle was used access the right common femoral artery under ultrasound. With excellent arterial blood flow returned, and an .018 micro wire was passed through the needle, observed enter the abdominal aorta under fluoroscopy. The needle was removed, and a micropuncture sheath was placed over the wire. The inner dilator and wire were removed, and an 035 Bentson wire was advanced under fluoroscopy into the abdominal aorta. The sheath was removed and a standard 5 [place] vascular sheath was placed. The dilator was removed and the sheath was flushed. C2 Cobra catheter was advanced over the Bentson wire to the aorta. Catheter was use elect the celiac artery. Angiogram was performed. Glidewire was used to navigate the [place] catheter into the common hepatic artery. Angiogram was performed. Microcatheter was then used to navigate a microwire into the gastroduodenal artery. Angiogram was performed. Empiric embolization was then performed with a coil mass, coiling the gastroduodenal artery to the origin. Catheters and wires were removed. Exoseal was deployed at the right common femoral artery for hemostasis. Patient tolerated the procedure well and remained hemodynamically stable throughout. No complications were  encountered and no significant blood loss. FINDINGS: Ultrasound demonstrates patency the right common femoral artery. Angiogram of the celiac artery demonstrates typical anatomy, with the celiac artery contributing to splenic artery and common hepatic artery. Gastroduodenal artery arises from the common hepatic artery. Angiogram of the  gastroduodenal artery demonstrates no tumor blush, extravasation, pseudoaneurysm. Duodenal branches originate from the mid segment of the descending gastroduodenal artery. Gastroepiploic artery is patent. Status post coil embolization there is no flow into the branches of the gastroduodenal artery, with slight filling at the stump and no collateral flow to the mid segment. IMPRESSION: Status post ultrasound guided access right common femoral artery for mesenteric angiogram and empiric embolization of gastroduodenal artery, as a target contributing to emergent, life-threatening upper GI hemorrhage. Status post Exoseal deployment for hemostasis. Signed, Yvone Neu. Reyne Dumas, RPVI [facility] Interventional Radiology Specialists [facility] Radiology Electronically Signed   By: Gilmer Mor D.O.   On: 10/22/2017 09:02   Ir US Guide Vasc Access Right  Result Date: 10/22/2017 INDICATION: 62 year old male with a history of acute, emergent upper GI bleed, with endoscopy demonstrating duodenal ulcer EXAM: SELECTIVE VISCERAL ARTERIOGRAPHY; IR ULTRASOUND GUIDANCE VASC ACCESS RIGHT; ADDITIONAL ARTERIOGRAPHY; IR EMBO ART VEN HEMORR LYMPH EXTRAV INC GUIDE ROADMAPPING MEDICATIONS: None ANESTHESIA/SEDATION: Moderate (conscious) sedation was employed during this procedure. A total of Versed 2.0 mg and Fentanyl 100 mcg was administered intravenously. Moderate Sedation Time: 40 minutes. The patient's level of consciousness and vital signs were monitored continuously by radiology nursing throughout the procedure under my direct supervision. CONTRAST:  60 cc Isovue FLUOROSCOPY TIME:  Fluoroscopy Time:  9 minutes 42 seconds (346 mGy). COMPLICATIONS: None PROCEDURE: Informed consent was obtained from the patient following explanation of the procedure, risks, benefits and alternatives. The patient understands, agrees and consents for the procedure. All questions were addressed. A time out was performed prior to the initiation of the procedure. Maximal barrier sterile technique utilized including caps, mask, sterile gowns, sterile gloves, large sterile drape, hand hygiene, and Betadine prep. Ultrasound survey of the right inguinal region was performed with images stored and sent to PACs, confirming patency of the vessel. A micropuncture needle was used access the right common femoral artery under ultrasound. With excellent arterial blood flow returned, and an .018 micro wire was passed through the needle, observed enter the abdominal aorta under fluoroscopy. The needle was removed, and a micropuncture sheath was placed over the wire. The inner dilator and wire were removed, and an 035 Bentson wire was advanced under fluoroscopy into the abdominal aorta. The sheath was removed and a standard 5 [place] vascular sheath was placed. The dilator was removed and the sheath was flushed. C2 Cobra catheter was advanced over the Bentson wire to the aorta. Catheter was use elect the celiac artery. Angiogram was performed. Glidewire was used to navigate the [place] catheter into the common hepatic artery. Angiogram was performed. Microcatheter was then used to navigate a microwire into the gastroduodenal artery. Angiogram was performed. Empiric embolization was then performed with a coil mass, coiling the gastroduodenal artery to the origin. Catheters and wires were removed. Exoseal was deployed at the right common femoral artery for hemostasis. Patient tolerated the procedure well and remained hemodynamically stable throughout. No complications were encountered and no significant blood loss. FINDINGS: Ultrasound demonstrates patency  the right common femoral artery. Angiogram of the celiac artery demonstrates typical anatomy, with the celiac artery contributing to splenic artery and common hepatic artery. Gastroduodenal artery arises from the common hepatic artery. Angiogram of the gastroduodenal artery demonstrates no tumor blush, extravasation, pseudoaneurysm. Duodenal branches originate from the mid segment of the descending gastroduodenal artery. Gastroepiploic artery is patent. Status post coil embolization there is no flow into the branches of the gastroduodenal artery, with slight filling at the stump and  no collateral flow to the mid segment. IMPRESSION: Status post ultrasound guided access right common femoral artery for mesenteric angiogram and empiric embolization of gastroduodenal artery, as a target contributing to emergent, life-threatening upper GI hemorrhage. Status post Exoseal deployment for hemostasis. Signed, Yvone Neu. Reyne Dumas, RPVI V[facility] and Interventional Radiology Specialists [facility] Radiology Electronically Signed   By: Gilmer Mor D.O.   On: 10/22/2017 09:02   Ir Embo [place] Hemorr Lymph [employer] Guide Roadmapping  Result Date: 10/22/2017 INDICATION: 62 year old male with a history of acute, emergent upper GI bleed, with endoscopy demonstrating duodenal ulcer EXAM: SELECTIVE VISCERAL ARTERIOGRAPHY; IR ULTRASOUND GUIDANCE VASC ACCESS RIGHT; ADDITIONAL ARTERIOGRAPHY; IR EMBO ART VEN HEMORR LYMPH EXTRAV INC GUIDE ROADMAPPING MEDICATIONS: None ANESTHESIA/SEDATION: Moderate (conscious) sedation was employed during this procedure. A total of Versed 2.0 mg and Fentanyl 100 mcg was administered intravenously. Moderate Sedation Time: 40 minutes. The patient's level of consciousness and vital signs were monitored continuously by radiology nursing throughout the procedure under my direct supervision. CONTRAST:  60 cc Isovue FLUOROSCOPY TIME:  Fluoroscopy Time: 9 minutes 42 seconds (346 mGy). COMPLICATIONS: None  PROCEDURE: Informed consent was obtained from the patient following explanation of the procedure, risks, benefits and alternatives. The patient understands, agrees and consents for the procedure. All questions were addressed. A time out was performed prior to the initiation of the procedure. Maximal barrier sterile technique utilized including caps, mask, sterile gowns, sterile gloves, large sterile drape, hand hygiene, and Betadine prep. Ultrasound survey of the right inguinal region was performed with images stored and sent to PACs, confirming patency of the vessel. A micropuncture needle was used access the right common femoral artery under ultrasound. With excellent arterial blood flow returned, and an .018 micro wire was passed through the needle, observed enter the abdominal aorta under fluoroscopy. The needle was removed, and a micropuncture sheath was placed over the wire. The inner dilator and wire were removed, and an 035 Bentson wire was advanced under fluoroscopy into the abdominal aorta. The sheath was removed and a standard 5 [place] vascular sheath was placed. The dilator was removed and the sheath was flushed. C2 Cobra catheter was advanced over the Bentson wire to the aorta. Catheter was use elect the celiac artery. Angiogram was performed. Glidewire was used to navigate the [place] catheter into the common hepatic artery. Angiogram was performed. Microcatheter was then used to navigate a microwire into the gastroduodenal artery. Angiogram was performed. Empiric embolization was then performed with a coil mass, coiling the gastroduodenal artery to the origin. Catheters and wires were removed. Exoseal was deployed at the right common femoral artery for hemostasis. Patient tolerated the procedure well and remained hemodynamically stable throughout. No complications were encountered and no significant blood loss. FINDINGS: Ultrasound demonstrates patency the right common femoral artery. Angiogram of the  celiac artery demonstrates typical anatomy, with the celiac artery contributing to splenic artery and common hepatic artery. Gastroduodenal artery arises from the common hepatic artery. Angiogram of the gastroduodenal artery demonstrates no tumor blush, extravasation, pseudoaneurysm. Duodenal branches originate from the mid segment of the descending gastroduodenal artery. Gastroepiploic artery is patent. Status post coil embolization there is no flow into the branches of the gastroduodenal artery, with slight filling at the stump and no collateral flow to the mid segment. IMPRESSION: Status post ultrasound guided access right common femoral artery for mesenteric angiogram and empiric embolization of gastroduodenal artery, as a target contributing to emergent, life-threatening upper GI hemorrhage. Status post Exoseal deployment  for hemostasis. Signed, Yvone Neu. Reyne Dumas, RPVI V[facility] and Inter[facility] Radiology Specialists [facility] Radiology Electronically Signed   By: Gilmer Mor D.O.   On: 10/22/2017 09:02    Labs:  CBC: Recent Labs    10/19/17 0544  10/20/17 0431 10/20/17 1212 10/21/17 0456 10/21/17 1920 10/22/17 0307  WBC 4.4  --  4.9  --  5.5  --  7.8  HGB 9.0*   < > 8.2* 7.9* 6.8* 9.0* 9.4*  HCT 26.9*   < > 24.6* 24.0* 21.0* 26.8* 29.5*  PLT 231  --  269  --  314  --  311   < > = values in this interval not displayed.    COAGS: Recent Labs    10/16/17 1130  INR 1.14    BMP: Recent Labs    10/18/17 0319 10/20/17 0431 10/21/17 0456 10/22/17 0307  NA 141 143 144 147*  K 4.2 4.2 4.1 4.2  CL 114* 116* 117* 119*  CO2 23 22 21* 19*  GLUCOSE 106* 92 108* 96  BUN 22 26* 44* 34*  CALCIUM 7.8* 8.0* 8.0* 8.2*  CREATININE 1.01 1.09 1.42* 1.32*  GFRNONAA >60 >60 51* 56*  GFRAA >60 >60 60* >60    LIVER FUNCTION TESTS: Recent Labs    10/16/17 1130 10/18/17 0319  BILITOT 0.4 0.5  AST 19 19  ALT 12 12  ALKPHOS 50 37*  PROT 6.5 4.7*  ALBUMIN 3.2* 2.5*     Assessment and Plan: Pt with hx anemia/UGI bleed , large duodenal ulcer; s/p empiric embolization of GDA yesterday; currently stable; afebrile; creat 1.32(1.42), hgb 9.4(9.0), WBC 7.8; cont hydration/lab monitoring   Electronically Signed: D. Jeananne Rama, PA-C 10/22/2017, 9:53 AM   I spent a total of 15 minutes at the the patient's bedside AND on the patient's hospital floor or unit, greater than 50% of which was counseling/coordinating care for mesenteric arteriogram with embolization    Patient ID: [name], male   DOB: 10/19/1955, 62 y.o.   MRN: [id number]

## 2017-10-22 NOTE — Progress Notes (Signed)
Subjective: Patient was seen and examined at bedside. He appears drowsy, not following commands, awake intermittently. No bowel movements in the last few days.  Objective: Vital signs in last 24 hours: Temp:  [97.5 F (36.4 C)-98.5 F (36.9 C)] 98.5 F (36.9 C) (07/02 0800) Pulse Rate:  [84-97] 94 (07/02 0500) Resp:  [16-30] 23 (07/02 0451) BP: (93-146)/(57-88) 140/62 (07/02 0500) SpO2:  [98 %-100 %] 98 % (07/02 0451) Weight change:  Last BM Date: 10/16/17  XT:GGYIRS, no icterus GENERAL:mild pallor ABDOMEN:soft, normoactive bowel sounds EXTREMITIES:no deformity  Lab Results: Results for orders placed or performed during the hospital encounter of 10/16/17 (from the past 48 hour(s))  CBC     Status: Abnormal   Collection Time: 10/21/17  4:56 AM  Result Value Ref Range   WBC 5.5 4.0 - 10.5 K/uL   RBC 2.13 (L) 4.22 - 5.81 MIL/uL   Hemoglobin 6.8 (LL) 13.0 - 17.0 g/dL    Comment: CRITICAL RESULT CALLED TO, READ BACK BY AND VERIFIED WITH: MCINTOSH, R. RN '@[id number]'  ON 7.1.19 BY NMCCOY    HCT 21.0 (L) 39.0 - 52.0 %   MCV 98.6 78.0 - 100.0 fL   MCH 31.9 26.0 - 34.0 pg   MCHC 32.4 30.0 - 36.0 g/dL   RDW 16.2 (H) 11.5 - 15.5 %   Platelets 314 150 - 400 K/uL    Comment: Performed at [facility], [place] [address] [place], [place] [address]  Basic metabolic panel     Status: Abnormal   Collection Time: 10/21/17  4:56 AM  Result Value Ref Range   Sodium 144 135 - 145 mmol/L   Potassium 4.1 3.5 - 5.1 mmol/L   Chloride 117 (H) 98 - 111 mmol/L    Comment: Please note change in reference range.   CO2 21 (L) 22 - 32 mmol/L   Glucose, Bld 108 (H) 70 - 99 mg/dL    Comment: Please note change in reference range.   BUN 44 (H) 8 - 23 mg/dL    Comment: Please note change in reference range.   Creatinine, Ser 1.42 (H) 0.61 - 1.24 mg/dL   Calcium 8.0 (L) 8.9 - 10.3 mg/dL   GFR calc non Af Amer 51 (L) >60 mL/min   GFR calc Af Amer 60 (L) >60 mL/min    Comment: (NOTE) The  eGFR has been calculated using the CKD EPI equation. This calculation has not been validated in all clinical situations. eGFR's persistently <60 mL/min signify possible Chronic Kidney Disease.    Anion gap 6 5 - 15    Comment: Performed at [facility], [place] [address] [place], [place] [address]  Prepare RBC     Status: None   Collection Time: 10/21/17  7:18 AM  Result Value Ref Range   Order Confirmation      ORDER PROCESSED BY BLOOD BANK Performed at [facility], [place] [address] [place], [place] [address]   Prepare RBC     Status: None   Collection Time: 10/21/17  7:18 AM  Result Value Ref Range   Order Confirmation      ORDER PROCESSED BY BLOOD BANK Performed at [facility], [place] [address] [place], [place] [address]   Type and screen [place]     Status: None   Collection Time: 10/21/17  7:25 AM  Result Value Ref Range   ABO/RH(D) A POS    Antibody Screen NEG    Sample Expiration  10/24/2017    Unit Number [id number]    Blood Component Type RED CELLS,LR    Unit division 00    Status of Unit ISSUED,FINAL    Transfusion Status OK TO TRANSFUSE    Crossmatch Result      Compatible Performed at [place] [address], [place], [place] [address]    Unit Number [id number]    Blood Component Type RED CELLS,LR    Unit division 00    Status of Unit ISSUED,FINAL    Transfusion Status OK TO TRANSFUSE    Crossmatch Result Compatible   Hemoglobin and hematocrit, blood     Status: Abnormal   Collection Time: 10/21/17  7:20 PM  Result Value Ref Range   Hemoglobin 9.0 (L) 13.0 - 17.0 g/dL    Comment: DELTA CHECK NOTED POST TRANSFUSION SPECIMEN    HCT 26.8 (L) 39.0 - 52.0 %    Comment: Performed at [facility], [place] [address], [place], [place] [address]  CBC     Status: Abnormal   Collection Time: 10/22/17  3:07 AM  Result Value Ref Range   WBC 7.8  4.0 - 10.5 K/uL   RBC 3.04 (L) 4.22 - 5.81 MIL/uL   Hemoglobin 9.4 (L) 13.0 - 17.0 g/dL   HCT 29.5 (L) 39.0 - 52.0 %   MCV 97.0 78.0 - 100.0 fL   MCH 30.9 26.0 - 34.0 pg   MCHC 31.9 30.0 - 36.0 g/dL   RDW 16.4 (H) 11.5 - 15.5 %   Platelets 311 150 - 400 K/uL    Comment: Performed at [facility], [place] [address], [place], [place] [address]  Basic metabolic panel     Status: Abnormal   Collection Time: 10/22/17  3:07 AM  Result Value Ref Range   Sodium 147 (H) 135 - 145 mmol/L   Potassium 4.2 3.5 - 5.1 mmol/L   Chloride 119 (H) 98 - 111 mmol/L    Comment: Please note change in reference range.   CO2 19 (L) 22 - 32 mmol/L   Glucose, Bld 96 70 - 99 mg/dL    Comment: Please note change in reference range.   BUN 34 (H) 8 - 23 mg/dL    Comment: Please note change in reference range.   Creatinine, Ser 1.32 (H) 0.61 - 1.24 mg/dL   Calcium 8.2 (L) 8.9 - 10.3 mg/dL   GFR calc non Af Amer 56 (L) >60 mL/min   GFR calc Af Amer >60 >60 mL/min    Comment: (NOTE) The eGFR has been calculated using the CKD EPI equation. This calculation has not been validated in all clinical situations. eGFR's persistently <60 mL/min signify possible Chronic Kidney Disease.    Anion gap 9 5 - 15    Comment: Performed at [facility], [place] [address] [place], [place] [address]  CBC     Status: Abnormal   Collection Time: 10/22/17 11:53 AM  Result Value Ref Range   WBC 8.3 4.0 - 10.5 K/uL   RBC 2.92 (L) 4.22 - 5.81 MIL/uL   Hemoglobin 9.4 (L) 13.0 - 17.0 g/dL   HCT 28.3 (L) 39.0 - 52.0 %   MCV 96.9 78.0 - 100.0 fL   MCH 32.2 26.0 - 34.0 pg   MCHC 33.2 30.0 - 36.0 g/dL   RDW 16.4 (H) 11.5 - 15.5 %   Platelets 334 150 - 400 K/uL    Comment: Performed at [facility], [place]  [address] [place], [place] [address]    Studies/Results: Ir Angiogram Visceral Selective  Result Date: 10/22/2017 INDICATION: 62 year old male with a history of acute, emergent  upper GI bleed, with endoscopy demonstrating duodenal ulcer EXAM: SELECTIVE VISCERAL ARTERIOGRAPHY; IR ULTRASOUND GUIDANCE VASC ACCESS RIGHT; ADDITIONAL ARTERIOGRAPHY; IR EMBO ART VEN HEMORR LYMPH EXTRAV INC GUIDE ROADMAPPING MEDICATIONS: None ANESTHESIA/SEDATION: Moderate (conscious) sedation was employed during this procedure. A total of Versed 2.0 mg and Fentanyl 100 mcg was administered intravenously. Moderate Sedation Time: 40 minutes. The patient's level of consciousness and vital signs were monitored continuously by radiology nursing throughout the procedure under my direct supervision. CONTRAST:  60 cc Isovue FLUOROSCOPY TIME:  Fluoroscopy Time: 9 minutes 42 seconds (346 mGy). COMPLICATIONS: None PROCEDURE: Informed consent was obtained from the patient following explanation of the procedure, risks, benefits and alternatives. The patient understands, agrees and consents for the procedure. All questions were addressed. A time out was performed prior to the initiation of the procedure. Maximal barrier sterile technique utilized including caps, mask, sterile gowns, sterile gloves, large sterile drape, hand hygiene, and Betadine prep. Ultrasound survey of the right inguinal region was performed with images stored and sent to PACs, confirming patency of the vessel. A micropuncture needle was used access the right common femoral artery under ultrasound. With excellent arterial blood flow returned, and an .018 micro wire was passed through the needle, observed enter the abdominal aorta under fluoroscopy. The needle was removed, and a micropuncture sheath was placed over the wire. The inner dilator and wire were removed, and an 035 Bentson wire was advanced under fluoroscopy into the abdominal aorta. The sheath was removed and a standard 5 [place] vascular sheath was placed. The dilator was removed and the sheath was flushed. C2 Cobra catheter was advanced over the Bentson wire to the aorta. Catheter was use elect  the celiac artery. Angiogram was performed. Glidewire was used to navigate the [place] catheter into the common hepatic artery. Angiogram was performed. Microcatheter was then used to navigate a microwire into the gastroduodenal artery. Angiogram was performed. Empiric embolization was then performed with a coil mass, coiling the gastroduodenal artery to the origin. Catheters and wires were removed. Exoseal was deployed at the right common femoral artery for hemostasis. Patient tolerated the procedure well and remained hemodynamically stable throughout. No complications were encountered and no significant blood loss. FINDINGS: Ultrasound demonstrates patency the right common femoral artery. Angiogram of the celiac artery demonstrates typical anatomy, with the celiac artery contributing to splenic artery and common hepatic artery. Gastroduodenal artery arises from the common hepatic artery. Angiogram of the gastroduodenal artery demonstrates no tumor blush, extravasation, pseudoaneurysm. Duodenal branches originate from the mid segment of the descending gastroduodenal artery. Gastroepiploic artery is patent. Status post coil embolization there is no flow into the branches of the gastroduodenal artery, with slight filling at the stump and no collateral flow to the mid segment. IMPRESSION: Status post ultrasound guided access right common femoral artery for mesenteric angiogram and empiric embolization of gastroduodenal artery, as a target contributing to emergent, life-threatening upper GI hemorrhage. Status post Exoseal deployment for hemostasis. Signed, Dulcy Fanny. Dellia Nims, RPVI [facility] Inter[facility] Radiology Special[facility] Radiology Electronically Signed   By: Corrie Mckusick D.O.   On: 10/22/2017 09:02   Ir Angiogram Selective Each Additional Vessel  Result Date: 10/22/2017 INDICATION: 62 year old male with a history of acute, emergent upper GI bleed, with endoscopy demonstrating duodenal ulcer EXAM:  SELECTIVE VISCERAL ARTERIOGRAPHY; IR ULTRASOUND GUIDANCE VASC ACCESS RIGHT; ADDITIONAL  ARTERIOGRAPHY; IR EMBO ART VEN HEMORR LYMPH EXTRAV INC GUIDE ROADMAPPING MEDICATIONS: None ANESTHESIA/SEDATION: Moderate (conscious) sedation was employed during this procedure. A total of Versed 2.0 mg and Fentanyl 100 mcg was administered intravenously. Moderate Sedation Time: 40 minutes. The patient's level of consciousness and vital signs were monitored continuously by radiology nursing throughout the procedure under my direct supervision. CONTRAST:  60 cc Isovue FLUOROSCOPY TIME:  Fluoroscopy Time: 9 minutes 42 seconds (346 mGy). COMPLICATIONS: None PROCEDURE: Informed consent was obtained from the patient following explanation of the procedure, risks, benefits and alternatives. The patient understands, agrees and consents for the procedure. All questions were addressed. A time out was performed prior to the initiation of the procedure. Maximal barrier sterile technique utilized including caps, mask, sterile gowns, sterile gloves, large sterile drape, hand hygiene, and Betadine prep. Ultrasound survey of the right inguinal region was performed with images stored and sent to PACs, confirming patency of the vessel. A micropuncture needle was used access the right common femoral artery under ultrasound. With excellent arterial blood flow returned, and an .018 micro wire was passed through the needle, observed enter the abdominal aorta under fluoroscopy. The needle was removed, and a micropuncture sheath was placed over the wire. The inner dilator and wire were removed, and an 035 Bentson wire was advanced under fluoroscopy into the abdominal aorta. The sheath was removed and a standard 5 [place] vascular sheath was placed. The dilator was removed and the sheath was flushed. C2 Cobra catheter was advanced over the Bentson wire to the aorta. Catheter was use elect the celiac artery. Angiogram was performed. Glidewire was used to  navigate the [place] catheter into the common hepatic artery. Angiogram was performed. Microcatheter was then used to navigate a microwire into the gastroduodenal artery. Angiogram was performed. Empiric embolization was then performed with a coil mass, coiling the gastroduodenal artery to the origin. Catheters and wires were removed. Exoseal was deployed at the right common femoral artery for hemostasis. Patient tolerated the procedure well and remained hemodynamically stable throughout. No complications were encountered and no significant blood loss. FINDINGS: Ultrasound demonstrates patency the right common femoral artery. Angiogram of the celiac artery demonstrates typical anatomy, with the celiac artery contributing to splenic artery and common hepatic artery. Gastroduodenal artery arises from the common hepatic artery. Angiogram of the gastroduodenal artery demonstrates no tumor blush, extravasation, pseudoaneurysm. Duodenal branches originate from the mid segment of the descending gastroduodenal artery. Gastroepiploic artery is patent. Status post coil embolization there is no flow into the branches of the gastroduodenal artery, with slight filling at the stump and no collateral flow to the mid segment. IMPRESSION: Status post ultrasound guided access right common femoral artery for mesenteric angiogram and empiric embolization of gastroduodenal artery, as a target contributing to emergent, life-threatening upper GI hemorrhage. Status post Exoseal deployment for hemostasis. Signed, Dulcy Fanny. Dellia Nims, RPVI Vascular and Interventional Radiology Specialists [facility] Radiology Electronically Signed   By: Corrie Mckusick D.O.   On: 10/22/2017 09:02   Ir Angiogram Selective Each Additional Vessel  Result Date: 10/22/2017 INDICATION: 62 year old male with a history of acute, emergent upper GI bleed, with endoscopy demonstrating duodenal ulcer EXAM: SELECTIVE VISCERAL ARTERIOGRAPHY; IR ULTRASOUND GUIDANCE VASC  ACCESS RIGHT; ADDITIONAL ARTERIOGRAPHY; IR EMBO ART VEN HEMORR LYMPH EXTRAV INC GUIDE ROADMAPPING MEDICATIONS: None ANESTHESIA/SEDATION: Moderate (conscious) sedation was employed during this procedure. A total of Versed 2.0 mg and Fentanyl 100 mcg was administered intravenously. Moderate Sedation Time: 40 minutes. The patient's level of consciousness and  vital signs were monitored continuously by radiology nursing throughout the procedure under my direct supervision. CONTRAST:  60 cc Isovue FLUOROSCOPY TIME:  Fluoroscopy Time: 9 minutes 42 seconds (346 mGy). COMPLICATIONS: None PROCEDURE: Informed consent was obtained from the patient following explanation of the procedure, risks, benefits and alternatives. The patient understands, agrees and consents for the procedure. All questions were addressed. A time out was performed prior to the initiation of the procedure. Maximal barrier sterile technique utilized including caps, mask, sterile gowns, sterile gloves, large sterile drape, hand hygiene, and Betadine prep. Ultrasound survey of the right inguinal region was performed with images stored and sent to PACs, confirming patency of the vessel. A micropuncture needle was used access the right common femoral artery under ultrasound. With excellent arterial blood flow returned, and an .018 micro wire was passed through the needle, observed enter the abdominal aorta under fluoroscopy. The needle was removed, and a micropuncture sheath was placed over the wire. The inner dilator and wire were removed, and an 035 Bentson wire was advanced under fluoroscopy into the abdominal aorta. The sheath was removed and a standard 5 [place] vascular sheath was placed. The dilator was removed and the sheath was flushed. C2 Cobra catheter was advanced over the Bentson wire to the aorta. Catheter was use elect the celiac artery. Angiogram was performed. Glidewire was used to navigate the [place] catheter into the common hepatic artery.  Angiogram was performed. Microcatheter was then used to navigate a microwire into the gastroduodenal artery. Angiogram was performed. Empiric embolization was then performed with a coil mass, coiling the gastroduodenal artery to the origin. Catheters and wires were removed. Exoseal was deployed at the right common femoral artery for hemostasis. Patient tolerated the procedure well and remained hemodynamically stable throughout. No complications were encountered and no significant blood loss. FINDINGS: Ultrasound demonstrates patency the right common femoral artery. Angiogram of the celiac artery demonstrates typical anatomy, with the celiac artery contributing to splenic artery and common hepatic artery. Gastroduodenal artery arises from the common hepatic artery. Angiogram of the gastroduodenal artery demonstrates no tumor blush, extravasation, pseudoaneurysm. Duodenal branches originate from the mid segment of the descending gastroduodenal artery. Gastroepiploic artery is patent. Status post coil embolization there is no flow into the branches of the gastroduodenal artery, with slight filling at the stump and no collateral flow to the mid segment. IMPRESSION: Status post ultrasound guided access right common femoral artery for mesenteric angiogram and empiric embolization of gastroduodenal artery, as a target contributing to emergent, life-threatening upper GI hemorrhage. Status post Exoseal deployment for hemostasis. Signed, Dulcy Fanny. Dellia Nims, RPVI Vascular and Interventional Radiology Specialists [facility] Radiology Electronically Signed   By: Corrie Mckusick D.O.   On: 10/22/2017 09:02   Ir US Guide Vasc Access Right  Result Date: 10/22/2017 INDICATION: 62 year old male with a history of acute, emergent upper GI bleed, with endoscopy demonstrating duodenal ulcer EXAM: SELECTIVE VISCERAL ARTERIOGRAPHY; IR ULTRASOUND GUIDANCE VASC ACCESS RIGHT; ADDITIONAL ARTERIOGRAPHY; IR EMBO ART VEN HEMORR LYMPH EXTRAV INC  GUIDE ROADMAPPING MEDICATIONS: None ANESTHESIA/SEDATION: Moderate (conscious) sedation was employed during this procedure. A total of Versed 2.0 mg and Fentanyl 100 mcg was administered intravenously. Moderate Sedation Time: 40 minutes. The patient's level of consciousness and vital signs were monitored continuously by radiology nursing throughout the procedure under my direct supervision. CONTRAST:  60 cc Isovue FLUOROSCOPY TIME:  Fluoroscopy Time: 9 minutes 42 seconds (346 mGy). COMPLICATIONS: None PROCEDURE: Informed consent was obtained from the patient following explanation of the procedure,  risks, benefits and alternatives. The patient understands, agrees and consents for the procedure. All questions were addressed. A time out was performed prior to the initiation of the procedure. Maximal barrier sterile technique utilized including caps, mask, sterile gowns, sterile gloves, large sterile drape, hand hygiene, and Betadine prep. Ultrasound survey of the right inguinal region was performed with images stored and sent to PACs, confirming patency of the vessel. A micropuncture needle was used access the right common femoral artery under ultrasound. With excellent arterial blood flow returned, and an .018 micro wire was passed through the needle, observed enter the abdominal aorta under fluoroscopy. The needle was removed, and a micropuncture sheath was placed over the wire. The inner dilator and wire were removed, and an 035 Bentson wire was advanced under fluoroscopy into the abdominal aorta. The sheath was removed and a standard 5 [place] vascular sheath was placed. The dilator was removed and the sheath was flushed. C2 Cobra catheter was advanced over the Bentson wire to the aorta. Catheter was use elect the celiac artery. Angiogram was performed. Glidewire was used to navigate the [place] catheter into the common hepatic artery. Angiogram was performed. Microcatheter was then used to navigate a microwire into  the gastroduodenal artery. Angiogram was performed. Empiric embolization was then performed with a coil mass, coiling the gastroduodenal artery to the origin. Catheters and wires were removed. Exoseal was deployed at the right common femoral artery for hemostasis. Patient tolerated the procedure well and remained hemodynamically stable throughout. No complications were encountered and no significant blood loss. FINDINGS: Ultrasound demonstrates patency the right common femoral artery. Angiogram of the celiac artery demonstrates typical anatomy, with the celiac artery contributing to splenic artery and common hepatic artery. Gastroduodenal artery arises from the common hepatic artery. Angiogram of the gastroduodenal artery demonstrates no tumor blush, extravasation, pseudoaneurysm. Duodenal branches originate from the mid segment of the descending gastroduodenal artery. Gastroepiploic artery is patent. Status post coil embolization there is no flow into the branches of the gastroduodenal artery, with slight filling at the stump and no collateral flow to the mid segment. IMPRESSION: Status post ultrasound guided access right common femoral artery for mesenteric angiogram and empiric embolization of gastroduodenal artery, as a target contributing to emergent, life-threatening upper GI hemorrhage. Status post Exoseal deployment for hemostasis. Signed, Dulcy Fanny. Dellia Nims, RPVI Vascular and Interventional Radiology Specialists [facility] Radiology Electronically Signed   By: Corrie Mckusick D.O.   On: 10/22/2017 09:02   Ir Embo Otilio Saber Hemorr Lymph [employer] Guide Roadmapping  Result Date: 10/22/2017 INDICATION: 62 year old male with a history of acute, emergent upper GI bleed, with endoscopy demonstrating duodenal ulcer EXAM: SELECTIVE VISCERAL ARTERIOGRAPHY; IR ULTRASOUND GUIDANCE VASC ACCESS RIGHT; ADDITIONAL ARTERIOGRAPHY; IR EMBO ART VEN HEMORR LYMPH EXTRAV INC GUIDE ROADMAPPING MEDICATIONS: None  ANESTHESIA/SEDATION: Moderate (conscious) sedation was employed during this procedure. A total of Versed 2.0 mg and Fentanyl 100 mcg was administered intravenously. Moderate Sedation Time: 40 minutes. The patient's level of consciousness and vital signs were monitored continuously by radiology nursing throughout the procedure under my direct supervision. CONTRAST:  60 cc Isovue FLUOROSCOPY TIME:  Fluoroscopy Time: 9 minutes 42 seconds (346 mGy). COMPLICATIONS: None PROCEDURE: Informed consent was obtained from the patient following explanation of the procedure, risks, benefits and alternatives. The patient understands, agrees and consents for the procedure. All questions were addressed. A time out was performed prior to the initiation of the procedure. Maximal barrier sterile technique utilized including caps, mask, sterile gowns, sterile  gloves, large sterile drape, hand hygiene, and Betadine prep. Ultrasound survey of the right inguinal region was performed with images stored and sent to PACs, confirming patency of the vessel. A micropuncture needle was used access the right common femoral artery under ultrasound. With excellent arterial blood flow returned, and an .018 micro wire was passed through the needle, observed enter the abdominal aorta under fluoroscopy. The needle was removed, and a micropuncture sheath was placed over the wire. The inner dilator and wire were removed, and an 035 Bentson wire was advanced under fluoroscopy into the abdominal aorta. The sheath was removed and a standard 5 [place] vascular sheath was placed. The dilator was removed and the sheath was flushed. C2 Cobra catheter was advanced over the Bentson wire to the aorta. Catheter was use elect the celiac artery. Angiogram was performed. Glidewire was used to navigate the [place] catheter into the common hepatic artery. Angiogram was performed. Microcatheter was then used to navigate a microwire into the gastroduodenal artery. Angiogram  was performed. Empiric embolization was then performed with a coil mass, coiling the gastroduodenal artery to the origin. Catheters and wires were removed. Exoseal was deployed at the right common femoral artery for hemostasis. Patient tolerated the procedure well and remained hemodynamically stable throughout. No complications were encountered and no significant blood loss. FINDINGS: Ultrasound demonstrates patency the right common femoral artery. Angiogram of the celiac artery demonstrates typical anatomy, with the celiac artery contributing to splenic artery and common hepatic artery. Gastroduodenal artery arises from the common hepatic artery. Angiogram of the gastroduodenal artery demonstrates no tumor blush, extravasation, pseudoaneurysm. Duodenal branches originate from the mid segment of the descending gastroduodenal artery. Gastroepiploic artery is patent. Status post coil embolization there is no flow into the branches of the gastroduodenal artery, with slight filling at the stump and no collateral flow to the mid segment. IMPRESSION: Status post ultrasound guided access right common femoral artery for mesenteric angiogram and empiric embolization of gastroduodenal artery, as a target contributing to emergent, life-threatening upper GI hemorrhage. Status post Exoseal deployment for hemostasis. Signed, Dulcy Fanny. Dellia Nims, RPVI [facility] Radiology Electronically Signed   By: Corrie Mckusick D.O.   On: 10/22/2017 09:02    Medications: I have reviewed the patient's current medications.  Assessment: 1. Cratered duodenal bulb ulcer, s/p IR guided embolization of GDA,hemoglobin has stabilized from 6.8 to 9/9.4/9.4 2.Alcohol withdrawal  Plan: Patient appears drowsy,unable to take PO meds, has not eaten much. Will start on normal saline at 100 mL an hour, with IV folic acid/thiamine/multivitamin.  Continue PPI twice a day.   Ronnette Juniper 10/22/2017,  12:14 PM   Pager [phone] If no answer or after 5 PM call [phone]

## 2017-10-22 NOTE — Progress Notes (Signed)
PT Cancellation Note  Patient Details Name: [name] MRN: [id number] DOB: 12-01-1955   Cancelled Treatment:    Reason Eval/Treat Not Completed: Medical issues which prohibited therapy   Rada Hay 10/22/2017, 1:45 PM  Blanchard Kelch PT [phone]

## 2017-10-22 NOTE — Progress Notes (Signed)
PROGRESS NOTE    [name]  UJW:[id number] DOB: [birth year] DOA: 10/16/2017 PCP: Rinaldo Cloud, MD   Brief Narrative:  HPI on 10/16/2017 by Dr. Latrelle Dodrill [name] is a 62 y.o. male with medical history significant of MS, bipolar disorder, alcohol and tobacco abuse, presented to the emergency department complaining of abdominal pain with nausea and vomiting.  Patient is poor historian.  He reports to 2-3 days of abdominal pain associated with bloody nausea and vomiting.  Patient reported epigastric pain started approximately 2 weeks ago which has been worse with eating.  Denies diarrhea, constipation and fever or chills.  Last alcohol use 3 days ago.  Normally drinks occasionally  but can drink up to 12 pack a day.  Other associated symptoms include weakness/faint.  Denies NSAID/ASA use, no history of GI bleeding in the past.  Patient denies dark stools.  Interim history Admitted for symptomatic anemia and GI bleed. GI consulted, s/p EGD. Given several transfusions. Also having delirium, possibly from alcohol withdrawal. IR consulted as patient continues to have anemia.   Assessment & Plan   Symptomatic anemia/GI bleed -Presented with weakness, palpitations and soft blood pressure -Chronic use of alcohol; denies use of NSAIDs -Hemoglobin approximately 14-15, on admission was 10.1 -FOBT positive -Was on Protonix drip for 48 hours, and transitioned to IV BID -Anemia panel showed an iron of 77, ferritin of 122, vitamin B12 180, folate 11.4 -Given that vitamin B12 is on the low normal side, dose of B12 given -Gastroenterology consulted and appreciated, status post EGD: Showed very large, deep and created duodenal bulb/duodenal sweep ulcer with pigmented spots.  LA grade D esophagitis -Gastroenterology recommended to transfuse and keep hemoglobin around 8.  If evidence of rebleeding recommended IR consult for empiric embolization of GDA. -no episodes of melena or hematochezia -has  received 5u PRBCs during this hospitalization -Hemoglobin currently 9.4 -s/p empiric embolization of GDA on 10/21/2017 by IR   Acute kidney injury with mild hyperkalemia -Creatinine on admission 1.4, currently 1.32 -Baseline creatinine approximately 0.9-1 -hyperkalemia resolved -Continue to monitor BMP  Multiple sclerosis -Neurology, Dr. Epimenio Foot -Stable, continue tecfidera  Bipolar disorder -Continue Abilify, Wellbutrin, Lexapro, lithium  Alcohol abuse with acute delirium -Continue CIWA- changed to stepdown protocol -Continue q6PRN and seroquel QHS -mental status waxes and wanes  Tremors -possibly related to MS vs alcohol withdrawal -Continue to monitor   Tobacco abuse -Nicotine cessation discussed, declined nicotine patch  Deconditioning  -PT and OT consulted and pending   DVT Prophylaxis  SCDs  Code Status: Full  Family Communication: None at bedside  Disposition Plan: Admitted. Pending PT/OT consults, improvement in mental status and stabilization of hemoglobin. Dispo TBD  Consultants Gastroenterology Interventional radiology  Procedures  EGD US guided access right CFA, with mesenteric angiogram, and empiric embolization of the GDA to the origin for acute upper GI hemorrhage.  Exoseal device at the right CFA for hemostasis  Antibiotics   Anti-infectives (From admission, onward)   None      Subjective:   [name] seen and examined today. Per RN, recently received Ativan. Patient currently sleeping and unable to fully arouse.  Objective:   Vitals:   10/22/17 0445 10/22/17 0451 10/22/17 0500 10/22/17 0800  BP: 115/64  140/62   Pulse:   94   Resp: (!) 29 (!) 23    Temp:    98.5 F (36.9 C)  TempSrc:    Oral  SpO2: 98% 98%    Weight:  Height:        Intake/Output Summary (Last 24 hours) at 10/22/2017 1121 Last data filed at 10/22/2017 0430 Gross per 24 hour  Intake 377 ml  Output 640 ml  Net -263 ml   Filed Weights   10/17/17 0859  Weight:  80.1 kg (176 lb 9.4 oz)   Exam  General: Well developed, chronically ill appearing, NAD  HEENT: NCAT, mucous membranes moist.   Neck: Supple  Cardiovascular: S1 S2 auscultated, RRR, no murmur  Respiratory: Clear to auscultation bilaterally with equal chest rise  Abdomen: Soft, nontender, nondistended, + bowel sounds  Extremities: warm dry without cyanosis clubbing or edema  Neuro: not easily arousable (given Ativan)  Psych: Cannot assess   Data Reviewed: I have personally reviewed following labs and imaging studies  CBC: Recent Labs  Lab 10/18/17 0319  10/19/17 0544  10/20/17 0431 10/20/17 1212 10/21/17 0456 10/21/17 1920 10/22/17 0307  WBC 5.5  --  4.4  --  4.9  --  5.5  --  7.8  HGB 7.1*   < > 9.0*   < > 8.2* 7.9* 6.8* 9.0* 9.4*  HCT 21.8*   < > 26.9*   < > 24.6* 24.0* 21.0* 26.8* 29.5*  MCV 97.8  --  95.1  --  97.2  --  98.6  --  97.0  PLT 214  --  231  --  269  --  314  --  311   < > = values in this interval not displayed.   Basic Metabolic Panel: Recent Labs  Lab 10/17/17 0328 10/18/17 0319 10/20/17 0431 10/21/17 0456 10/22/17 0307  NA 137 141 143 144 147*  K 3.8 4.2 4.2 4.1 4.2  CL 109 114* 116* 117* 119*  CO2 21* 23 22 21* 19*  GLUCOSE 106* 106* 92 108* 96  BUN 40* 22 26* 44* 34*  CREATININE 1.31* 1.01 1.09 1.42* 1.32*  CALCIUM 8.0* 7.8* 8.0* 8.0* 8.2*  MG 2.1  --   --   --   --    GFR: Estimated Creatinine Clearance: 61.8 mL/min (A) (by C-G formula based on SCr of 1.32 mg/dL (H)). Liver Function Tests: Recent Labs  Lab 10/16/17 1130 10/18/17 0319  AST 19 19  ALT 12 12  ALKPHOS 50 37*  BILITOT 0.4 0.5  PROT 6.5 4.7*  ALBUMIN 3.2* 2.5*   No results for input(s): LIPASE, AMYLASE in the last 168 hours. No results for input(s): AMMONIA in the last 168 hours. Coagulation Profile: Recent Labs  Lab 10/16/17 1130  INR 1.14   Cardiac Enzymes: No results for input(s): CKTOTAL, CKMB, CKMBINDEX, TROPONINI in the last 168 hours. BNP (last  3 results) No results for input(s): PROBNP in the last 8760 hours. HbA1C: No results for input(s): HGBA1C in the last 72 hours. CBG: No results for input(s): GLUCAP in the last 168 hours. Lipid Profile: No results for input(s): CHOL, HDL, LDLCALC, TRIG, CHOLHDL, LDLDIRECT in the last 72 hours. Thyroid Function Tests: No results for input(s): TSH, T4TOTAL, FREET4, T3FREE, THYROIDAB in the last 72 hours. Anemia Panel: No results for input(s): VITAMINB12, FOLATE, FERRITIN, TIBC, IRON, RETICCTPCT in the last 72 hours. Urine analysis:    Component Value Date/Time   COLORURINE YELLOW 09/24/2016 0924   APPEARANCEUR CLEAR 09/24/2016 0924   LABSPEC 1.009 09/24/2016 0924   PHURINE 7.0 09/24/2016 0924   GLUCOSEU NEGATIVE 09/24/2016 0924   HGBUR NEGATIVE 09/24/2016 0924   BILIRUBINUR NEGATIVE 09/24/2016 0924   KETONESUR NEGATIVE 09/24/2016 1610  PROTEINUR NEGATIVE 09/24/2016 0924   UROBILINOGEN 0.2 07/08/2013 0930   NITRITE NEGATIVE 09/24/2016 0924   LEUKOCYTESUR NEGATIVE 09/24/2016 0924   Sepsis Labs: @LABRCNTIP (procalcitonin:4,lacticidven:4)  ) Recent Results (from the past 240 hour(s))  MRSA PCR Screening     Status: None   Collection Time: 10/16/17  3:21 PM  Result Value Ref Range Status   MRSA by PCR NEGATIVE NEGATIVE Final    Comment:        The GeneXpert MRSA Assay (FDA approved for NASAL specimens only), is one component of a comprehensive MRSA colonization surveillance program. It is not intended to diagnose MRSA infection nor to guide or monitor treatment for MRSA infections. Performed at [facility], [address] [place], [place] [address]       Radiology Studies: Ir Angiogram Visceral Selective  Result Date: 10/22/2017 INDICATION: 62 year old male with a history of acute, emergent upper GI bleed, with endoscopy demonstrating duodenal ulcer EXAM: SELECTIVE VISCERAL ARTERIOGRAPHY; IR ULTRASOUND GUIDANCE VASC ACCESS RIGHT; ADDITIONAL  ARTERIOGRAPHY; IR EMBO ART VEN HEMORR LYMPH EXTRAV INC GUIDE ROADMAPPING MEDICATIONS: None ANESTHESIA/SEDATION: Moderate (conscious) sedation was employed during this procedure. A total of Versed 2.0 mg and Fentanyl 100 mcg was administered intravenously. Moderate Sedation Time: 40 minutes. The patient's level of consciousness and vital signs were monitored continuously by radiology nursing throughout the procedure under my direct supervision. CONTRAST:  60 cc Isovue FLUOROSCOPY TIME:  Fluoroscopy Time: 9 minutes 42 seconds (346 mGy). COMPLICATIONS: None PROCEDURE: Informed consent was obtained from the patient following explanation of the procedure, risks, benefits and alternatives. The patient understands, agrees and consents for the procedure. All questions were addressed. A time out was performed prior to the initiation of the procedure. Maximal barrier sterile technique utilized including caps, mask, sterile gowns, sterile gloves, large sterile drape, hand hygiene, and Betadine prep. Ultrasound survey of the right inguinal region was performed with images stored and sent to PACs, confirming patency of the vessel. A micropuncture needle was used access the right common femoral artery under ultrasound. With excellent arterial blood flow returned, and an .018 micro wire was passed through the needle, observed enter the abdominal aorta under fluoroscopy. The needle was removed, and a micropuncture sheath was placed over the wire. The inner dilator and wire were removed, and an 035 [place] wire was advanced under fluoroscopy into the abdominal aorta. The sheath was removed and a standard 5 [place] vascular sheath was placed. The dilator was removed and the sheath was flushed. C2 Cobra catheter was advanced over the Bentson wire to the aorta. Catheter was use elect the celiac artery. Angiogram was performed. Glidewire was used to navigate the [place] catheter into the common hepatic artery. Angiogram was performed.  Microcatheter was then used to navigate a microwire into the gastroduodenal artery. Angiogram was performed. Empiric embolization was then performed with a coil mass, coiling the gastroduodenal artery to the origin. Catheters and wires were removed. Exoseal was deployed at the right common femoral artery for hemostasis. Patient tolerated the procedure well and remained hemodynamically stable throughout. No complications were encountered and no significant blood loss. FINDINGS: Ultrasound demonstrates patency the right common femoral artery. Angiogram of the celiac artery demonstrates typical anatomy, with the celiac artery contributing to splenic artery and common hepatic artery. Gastroduodenal artery arises from the common hepatic artery. Angiogram of the gastroduodenal artery demonstrates no tumor blush, extravasation, pseudoaneurysm. Duodenal branches originate from the mid segment of the descending gastroduodenal artery. Gastroepiploic artery is patent. Status post coil embolization  there is no flow into the branches of the gastroduodenal artery, with slight filling at the stump and no collateral flow to the mid segment. IMPRESSION: Status post ultrasound guided access right common femoral artery for mesenteric angiogram and empiric embolization of gastroduodenal artery, as a target contributing to emergent, life-threatening upper GI hemorrhage. Status post Exoseal deployment for hemostasis. Signed, Yvone Neu. Reyne Dumas, RPVI [facility] Radiology Electronically Signed   By: Gilmer Mor D.O.   On: 10/22/2017 09:02   Ir Angiogram Selective Each Additional Vessel  Result Date: 10/22/2017 INDICATION: 62 year old male with a history of acute, emergent upper GI bleed, with endoscopy demonstrating duodenal ulcer EXAM: SELECTIVE VISCERAL ARTERIOGRAPHY; IR ULTRASOUND GUIDANCE VASC ACCESS RIGHT; ADDITIONAL ARTERIOGRAPHY; IR EMBO ART VEN HEMORR LYMPH EXTRAV INC GUIDE  ROADMAPPING MEDICATIONS: None ANESTHESIA/SEDATION: Moderate (conscious) sedation was employed during this procedure. A total of Versed 2.0 mg and Fentanyl 100 mcg was administered intravenously. Moderate Sedation Time: 40 minutes. The patient's level of consciousness and vital signs were monitored continuously by radiology nursing throughout the procedure under my direct supervision. CONTRAST:  60 cc Isovue FLUOROSCOPY TIME:  Fluoroscopy Time: 9 minutes 42 seconds (346 mGy). COMPLICATIONS: None PROCEDURE: Informed consent was obtained from the patient following explanation of the procedure, risks, benefits and alternatives. The patient understands, agrees and consents for the procedure. All questions were addressed. A time out was performed prior to the initiation of the procedure. Maximal barrier sterile technique utilized including caps, mask, sterile gowns, sterile gloves, large sterile drape, hand hygiene, and Betadine prep. Ultrasound survey of the right inguinal region was performed with images stored and sent to PACs, confirming patency of the vessel. A micropuncture needle was used access the right common femoral artery under ultrasound. With excellent arterial blood flow returned, and an .018 micro wire was passed through the needle, observed enter the abdominal aorta under fluoroscopy. The needle was removed, and a micropuncture sheath was placed over the wire. The inner dilator and wire were removed, and an 035 Bentson wire was advanced under fluoroscopy into the abdominal aorta. The sheath was removed and a standard 5 [place] vascular sheath was placed. The dilator was removed and the sheath was flushed. C2 Cobra catheter was advanced over the Bentson wire to the aorta. Catheter was use elect the celiac artery. Angiogram was performed. Glidewire was used to navigate the [place] catheter into the common hepatic artery. Angiogram was performed. Microcatheter was then used to navigate a microwire into the  gastroduodenal artery. Angiogram was performed. Empiric embolization was then performed with a coil mass, coiling the gastroduodenal artery to the origin. Catheters and wires were removed. Exoseal was deployed at the right common femoral artery for hemostasis. Patient tolerated the procedure well and remained hemodynamically stable throughout. No complications were encountered and no significant blood loss. FINDINGS: Ultrasound demonstrates patency the right common femoral artery. Angiogram of the celiac artery demonstrates typical anatomy, with the celiac artery contributing to splenic artery and common hepatic artery. Gastroduodenal artery arises from the common hepatic artery. Angiogram of the gastroduodenal artery demonstrates no tumor blush, extravasation, pseudoaneurysm. Duodenal branches originate from the mid segment of the descending gastroduodenal artery. Gastroepiploic artery is patent. Status post coil embolization there is no flow into the branches of the gastroduodenal artery, with slight filling at the stump and no collateral flow to the mid segment. IMPRESSION: Status post ultrasound guided access right common femoral artery for mesenteric angiogram and empiric embolization of gastroduodenal artery, as a  target contributing to emergent, life-threatening upper GI hemorrhage. Status post Exoseal deployment for hemostasis. Signed, Yvone Neu. Reyne Dumas, RPVI [facility] Radiology Special[facility] Radiology Electronically Signed   By: Gilmer Mor D.O.   On: 10/22/2017 09:02   Ir Angiogram Selective Each Additional Vessel  Result Date: 10/22/2017 INDICATION: 62 year old male with a history of acute, emergent upper GI bleed, with endoscopy demonstrating duodenal ulcer EXAM: SELECTIVE VISCERAL ARTERIOGRAPHY; IR ULTRASOUND GUIDANCE VASC ACCESS RIGHT; ADDITIONAL ARTERIOGRAPHY; IR EMBO ART VEN HEMORR LYMPH EXTRAV INC GUIDE ROADMAPPING MEDICATIONS: None ANESTHESIA/SEDATION: Moderate  (conscious) sedation was employed during this procedure. A total of Versed 2.0 mg and Fentanyl 100 mcg was administered intravenously. Moderate Sedation Time: 40 minutes. The patient's level of consciousness and vital signs were monitored continuously by radiology nursing throughout the procedure under my direct supervision. CONTRAST:  60 cc Isovue FLUOROSCOPY TIME:  Fluoroscopy Time: 9 minutes 42 seconds (346 mGy). COMPLICATIONS: None PROCEDURE: Informed consent was obtained from the patient following explanation of the procedure, risks, benefits and alternatives. The patient understands, agrees and consents for the procedure. All questions were addressed. A time out was performed prior to the initiation of the procedure. Maximal barrier sterile technique utilized including caps, mask, sterile gowns, sterile gloves, large sterile drape, hand hygiene, and Betadine prep. Ultrasound survey of the right inguinal region was performed with images stored and sent to PACs, confirming patency of the vessel. A micropuncture needle was used access the right common femoral artery under ultrasound. With excellent arterial blood flow returned, and an .018 micro wire was passed through the needle, observed enter the abdominal aorta under fluoroscopy. The needle was removed, and a micropuncture sheath was placed over the wire. The inner dilator and wire were removed, and an 035 Bentson wire was advanced under fluoroscopy into the abdominal aorta. The sheath was removed and a standard 5 [place] vascular sheath was placed. The dilator was removed and the sheath was flushed. C2 Cobra catheter was advanced over the Bentson wire to the aorta. Catheter was use elect the celiac artery. Angiogram was performed. Glidewire was used to navigate the [place] catheter into the common hepatic artery. Angiogram was performed. Microcatheter was then used to navigate a microwire into the gastroduodenal artery. Angiogram was performed. Empiric  embolization was then performed with a coil mass, coiling the gastroduodenal artery to the origin. Catheters and wires were removed. Exoseal was deployed at the right common femoral artery for hemostasis. Patient tolerated the procedure well and remained hemodynamically stable throughout. No complications were encountered and no significant blood loss. FINDINGS: Ultrasound demonstrates patency the right common femoral artery. Angiogram of the celiac artery demonstrates typical anatomy, with the celiac artery contributing to splenic artery and common hepatic artery. Gastroduodenal artery arises from the common hepatic artery. Angiogram of the gastroduodenal artery demonstrates no tumor blush, extravasation, pseudoaneurysm. Duodenal branches originate from the mid segment of the descending gastroduodenal artery. Gastroepiploic artery is patent. Status post coil embolization there is no flow into the branches of the gastroduodenal artery, with slight filling at the stump and no collateral flow to the mid segment. IMPRESSION: Status post ultrasound guided access right common femoral artery for mesenteric angiogram and empiric embolization of gastroduodenal artery, as a target contributing to emergent, life-threatening upper GI hemorrhage. Status post Exoseal deployment for hemostasis. Signed, Yvone Neu. Reyne Dumas, RPVI [facility] Inter[facility] Radiology Specialists [facility] Radiology Electronically Signed   By: Gilmer Mor D.O.   On: 10/22/2017 09:02   Ir US Guide  Vasc Access Right  Result Date: 10/22/2017 INDICATION: 62 year old male with a history of acute, emergent upper GI bleed, with endoscopy demonstrating duodenal ulcer EXAM: SELECTIVE VISCERAL ARTERIOGRAPHY; IR ULTRASOUND GUIDANCE VASC ACCESS RIGHT; ADDITIONAL ARTERIOGRAPHY; IR EMBO ART VEN HEMORR LYMPH EXTRAV INC GUIDE ROADMAPPING MEDICATIONS: None ANESTHESIA/SEDATION: Moderate (conscious) sedation was employed during this procedure. A total of Versed  2.0 mg and Fentanyl 100 mcg was administered intravenously. Moderate Sedation Time: 40 minutes. The patient's level of consciousness and vital signs were monitored continuously by radiology nursing throughout the procedure under my direct supervision. CONTRAST:  60 cc Isovue FLUOROSCOPY TIME:  Fluoroscopy Time: 9 minutes 42 seconds (346 mGy). COMPLICATIONS: None PROCEDURE: Informed consent was obtained from the patient following explanation of the procedure, risks, benefits and alternatives. The patient understands, agrees and consents for the procedure. All questions were addressed. A time out was performed prior to the initiation of the procedure. Maximal barrier sterile technique utilized including caps, mask, sterile gowns, sterile gloves, large sterile drape, hand hygiene, and Betadine prep. Ultrasound survey of the right inguinal region was performed with images stored and sent to PACs, confirming patency of the vessel. A micropuncture needle was used access the right common femoral artery under ultrasound. With excellent arterial blood flow returned, and an .018 micro wire was passed through the needle, observed enter the abdominal aorta under fluoroscopy. The needle was removed, and a micropuncture sheath was placed over the wire. The inner dilator and wire were removed, and an 035 Bentson wire was advanced under fluoroscopy into the abdominal aorta. The sheath was removed and a standard 5 [place] vascular sheath was placed. The dilator was removed and the sheath was flushed. C2 Cobra catheter was advanced over the Bentson wire to the aorta. Catheter was use elect the celiac artery. Angiogram was performed. Glidewire was used to navigate the [place] catheter into the common hepatic artery. Angiogram was performed. Microcatheter was then used to navigate a microwire into the gastroduodenal artery. Angiogram was performed. Empiric embolization was then performed with a coil mass, coiling the gastroduodenal artery  to the origin. Catheters and wires were removed. Exoseal was deployed at the right common femoral artery for hemostasis. Patient tolerated the procedure well and remained hemodynamically stable throughout. No complications were encountered and no significant blood loss. FINDINGS: Ultrasound demonstrates patency the right common femoral artery. Angiogram of the celiac artery demonstrates typical anatomy, with the celiac artery contributing to splenic artery and common hepatic artery. Gastroduodenal artery arises from the common hepatic artery. Angiogram of the gastroduodenal artery demonstrates no tumor blush, extravasation, pseudoaneurysm. Duodenal branches originate from the mid segment of the descending gastroduodenal artery. Gastroepiploic artery is patent. Status post coil embolization there is no flow into the branches of the gastroduodenal artery, with slight filling at the stump and no collateral flow to the mid segment. IMPRESSION: Status post ultrasound guided access right common femoral artery for mesenteric angiogram and empiric embolization of gastroduodenal artery, as a target contributing to emergent, life-threatening upper GI hemorrhage. Status post Exoseal deployment for hemostasis. Signed, Yvone Neu. Reyne Dumas, RPVI [facility] Inter[facility] Radiology Specialists [facility] Radiology Electronically Signed   By: Gilmer Mor D.O.   On: 10/22/2017 09:02   Ir Embo [place] Hemorr Lymph [employer] Guide Roadmapping  Result Date: 10/22/2017 INDICATION: 61 year old male with a history of acute, emergent upper GI bleed, with endoscopy demonstrating duodenal ulcer EXAM: SELECTIVE VISCERAL ARTERIOGRAPHY; IR ULTRASOUND GUIDANCE VASC ACCESS RIGHT; ADDITIONAL ARTERIOGRAPHY; IR EMBO ART VEN  HEMORR LYMPH EXTRAV INC GUIDE ROADMAPPING MEDICATIONS: None ANESTHESIA/SEDATION: Moderate (conscious) sedation was employed during this procedure. A total of Versed 2.0 mg and Fentanyl 100 mcg was administered  intravenously. Moderate Sedation Time: 40 minutes. The patient's level of consciousness and vital signs were monitored continuously by radiology nursing throughout the procedure under my direct supervision. CONTRAST:  60 cc Isovue FLUOROSCOPY TIME:  Fluoroscopy Time: 9 minutes 42 seconds (346 mGy). COMPLICATIONS: None PROCEDURE: Informed consent was obtained from the patient following explanation of the procedure, risks, benefits and alternatives. The patient understands, agrees and consents for the procedure. All questions were addressed. A time out was performed prior to the initiation of the procedure. Maximal barrier sterile technique utilized including caps, mask, sterile gowns, sterile gloves, large sterile drape, hand hygiene, and Betadine prep. Ultrasound survey of the right inguinal region was performed with images stored and sent to PACs, confirming patency of the vessel. A micropuncture needle was used access the right common femoral artery under ultrasound. With excellent arterial blood flow returned, and an .018 micro wire was passed through the needle, observed enter the abdominal aorta under fluoroscopy. The needle was removed, and a micropuncture sheath was placed over the wire. The inner dilator and wire were removed, and an 035 Bentson wire was advanced under fluoroscopy into the abdominal aorta. The sheath was removed and a standard 5 [place] vascular sheath was placed. The dilator was removed and the sheath was flushed. C2 Cobra catheter was advanced over the Bentson wire to the aorta. Catheter was use elect the celiac artery. Angiogram was performed. Glidewire was used to navigate the [place] catheter into the common hepatic artery. Angiogram was performed. Microcatheter was then used to navigate a microwire into the gastroduodenal artery. Angiogram was performed. Empiric embolization was then performed with a coil mass, coiling the gastroduodenal artery to the origin. Catheters and wires were  removed. Exoseal was deployed at the right common femoral artery for hemostasis. Patient tolerated the procedure well and remained hemodynamically stable throughout. No complications were encountered and no significant blood loss. FINDINGS: Ultrasound demonstrates patency the right common femoral artery. Angiogram of the celiac artery demonstrates typical anatomy, with the celiac artery contributing to splenic artery and common hepatic artery. Gastroduodenal artery arises from the common hepatic artery. Angiogram of the gastroduodenal artery demonstrates no tumor blush, extravasation, pseudoaneurysm. Duodenal branches originate from the mid segment of the descending gastroduodenal artery. Gastroepiploic artery is patent. Status post coil embolization there is no flow into the branches of the gastroduodenal artery, with slight filling at the stump and no collateral flow to the mid segment. IMPRESSION: Status post ultrasound guided access right common femoral artery for mesenteric angiogram and empiric embolization of gastroduodenal artery, as a target contributing to emergent, life-threatening upper GI hemorrhage. Status post Exoseal deployment for hemostasis. Signed, Yvone Neu. Reyne Dumas, RPVI [facility] Electronically Signed   By: Gilmer Mor D.O.   On: 10/22/2017 09:02     Scheduled Meds: . sodium chloride   Intravenous Once  . ARIPiprazole  5 mg Oral Daily  . buPROPion  300 mg Oral Daily  . Dimethyl Fumarate  240 mg Oral BID  . escitalopram  20 mg Oral Daily  . folic acid  1 mg Oral Daily  . lithium carbonate  450 mg Oral BID  . multivitamin with minerals  1 tablet Oral Daily  . pantoprazole (PROTONIX) IV  40 mg Intravenous Q12H  . QUEtiapine  25 mg Oral  QHS  . sodium chloride flush  3 mL Intravenous Q12H  . thiamine  100 mg Oral Daily   Or  . thiamine  100 mg Intravenous Daily   Continuous Infusions:    LOS: 4 days   Time Spent  in minutes   30 minutes  Elica Almas D.O. on 10/22/2017 at 11:21 AM  Between 7am to 7pm - Pager - [phone]  After 7pm go to [email or web address] - password [email or web address]  And look for the night coverage person covering for me after hours  [facility] Office  [phone]

## 2017-10-22 NOTE — Progress Notes (Signed)
OT Cancellation Note  Patient Details Name: [name] MRN: [id number] DOB: 04/12/1956   Cancelled Treatment:    Reason Eval/Treat Not Completed: Fatigue/lethargy limiting ability to participate  Pt not following commands for OT and eyes closed. Will try later in day or next day.  Lise Auer, [place] [phone]  Einar Crow D 10/22/2017, 1:34 PM

## 2017-10-22 NOTE — Progress Notes (Signed)
Chaplain following for support.  Familiar with pt from admissions in 2014.    Provided pastoral presence at bedside.  Kunta's speech was slurred and difficult to understand.  Was able to communicate in one-word sentences.     On prior admissions in 2014, Salim had been married, though he was going through divorce from his spouse.  He had a 62 year old son at time with whom he was concerned about losing touch with due to ongoing depression and alcohol use.    This chaplain will continue to follow for support around hospitalization.     [facility] / [facility] Chaplain Burnis Kingfisher, MDiv, [facility]

## 2017-10-23 ENCOUNTER — Inpatient Hospital Stay (HOSPITAL_COMMUNITY): Payer: Medicare HMO

## 2017-10-23 LAB — BASIC METABOLIC PANEL
Anion gap: 5 (ref 5–15)
BUN: 28 mg/dL — ABNORMAL HIGH (ref 8–23)
CO2: 22 mmol/L (ref 22–32)
Calcium: 8.3 mg/dL — ABNORMAL LOW (ref 8.9–10.3)
Chloride: 123 mmol/L — ABNORMAL HIGH (ref 98–111)
Creatinine, Ser: 1.35 mg/dL — ABNORMAL HIGH (ref 0.61–1.24)
GFR calc Af Amer: >60 mL/min (ref >60)
GFR calc non Af Amer: 55 mL/min — ABNORMAL LOW (ref >60)
Glucose, Bld: 129 mg/dL — ABNORMAL HIGH (ref 70–99)
Potassium: 4 mmol/L (ref 3.5–5.1)
Sodium: 150 mmol/L — ABNORMAL HIGH (ref 135–145)

## 2017-10-23 MED ORDER — DIMETHYL FUMARATE 240 MG PO CPDR: 240.0000 mg | DELAYED_RELEASE_CAPSULE | Freq: Two times a day (BID) | ORAL | Status: DC

## 2017-10-23 MED ORDER — M.V.I. ADULT IV INJ
Freq: Once | INTRAVENOUS | Status: AC
  Administered 2017-10-23: 12:00:00 via INTRAVENOUS
  Filled 2017-10-23: qty 10

## 2017-10-23 MED ORDER — OSMOLITE 1.5 CAL PO LIQD
1000.0000 mL | ORAL | Status: DC
  Administered 2017-10-23: 1000 mL
  Filled 2017-10-23 (×2): qty 1000

## 2017-10-23 MED ORDER — FREE WATER
200.0000 mL | Freq: Three times a day (TID) | Status: DC
  Administered 2017-10-23 – 2017-10-24 (×3): 200 mL

## 2017-10-23 MED ORDER — LORAZEPAM 2 MG/ML IJ SOLN
1.0000 mg | Freq: Once | INTRAMUSCULAR | Status: AC
  Administered 2017-10-23: 1 mg via INTRAVENOUS
  Filled 2017-10-23: qty 1

## 2017-10-23 MED ORDER — PRO-STAT SUGAR FREE PO LIQD
30.0000 mL | Freq: Two times a day (BID) | ORAL | Status: DC
  Administered 2017-10-23 – 2017-10-24 (×2): 30 mL
  Filled 2017-10-23 (×2): qty 30

## 2017-10-23 MED ORDER — DEXTROSE 5 % IV SOLN
INTRAVENOUS | Status: DC
  Administered 2017-10-23 – 2017-10-24 (×4): via INTRAVENOUS

## 2017-10-23 MED ORDER — JEVITY 1.2 CAL PO LIQD: 1000.0000 mL | ORAL | Status: DC

## 2017-10-23 MED ORDER — DIMETHYL FUMARATE 240 MG PO CPDR
240.0000 mg | DELAYED_RELEASE_CAPSULE | Freq: Two times a day (BID) | ORAL | Status: DC
Start: 2017-10-23 — End: 2017-11-11
  Administered 2017-11-10 – 2017-11-11 (×2): 240 mg via ORAL
  Filled 2017-10-23: qty 1

## 2017-10-23 NOTE — Progress Notes (Signed)
OT Cancellation Note  Patient Details Name: [name] MRN: [id number] DOB: July 14, 1955   Cancelled Treatment:    Reason Eval/Treat Not Completed: Medical issues which prohibited therapy  Alba Cory 10/23/2017, 9:42 AM

## 2017-10-23 NOTE — Progress Notes (Signed)
Initial Nutrition Assessment  DOCUMENTATION CODES:   Not applicable  INTERVENTION:  - Will order for new weight today. - Free water flush to be per Hospitalist or GI given current hypernatremia and hyperchloridemia. - Will order 30 mL Prostat BID with Osmolite 1.5 (fiber-free formula) @ 20 mL/hr to advance by 10 mL every 8 hours to reach goal rate of Osmolite 1.5 @ 50 mL/hr. At goal rate, this regimen will provide 2000 kcal, 105 grams of protein, and 914 mL free water.     Monitor magnesium, potassium, and phosphorus daily for at least 3 days, MD to replete as needed, as pt is at risk for refeeding syndrome given hx of alcohol abuse with current withdrawal and very poor intakes since admission (x6 days).   NUTRITION DIAGNOSIS:   Inadequate oral intake related to lethargy/confusion, acute illness(alcohol withdrawal) as evidenced by meal completion < 25%.  GOAL:   Patient will meet greater than or equal to 90% of their needs  MONITOR:   TF tolerance, PO intake, Weight trends, Labs  REASON FOR ASSESSMENT:   Consult Enteral/tube feeding initiation and management  ASSESSMENT:   62 y.o. male with medical history significant of MS, bipolar disorder, alcohol and tobacco abuse, presented to the emergency department complaining of abdominal pain with nausea and vomiting.  Patient is poor historian.  He reports to 2-3 days of abdominal pain associated with bloody nausea and vomiting.  Patient reported epigastric pain started approximately 2 weeks ago which has been worse with eating.   BMI indicates normal weight/borderline overweight. Patient has been NPO or on CLD or FLD since admission with 0-25% intakes. He is noted to be disoriented x4; able to vocalize but not form words at time of RD visit. Roommate was at bedside. RN was at bedside and confirms that she has an order for NGT placement (no TF access at this time). Will place order as outlined above with slow advancement d/t concern for  possible refeeding risk.   Per Dr. Cristopher Estimable note yesterday AM: symptomatic anemia and GIB 2/2 duodenal bulb ulcer s/p embolization on 7/1, AKI, hx of MS, bipolar disorder, alcohol abuse with current acute delirium, tremors possibly 2/2 MS vs alcohol withdrawal, deconditioning.   NFPE outlined below. Per chart review, weight has been stable since December 2018. Unable to state malnutrition based on ASPEN criteria. No weight obtained since admission.    Medications reviewed; 1 mg IV folic acid/day, IV MVI x1L bag today, 100 mg IV thiamine/day.  Labs reviewed; Na: 150 mmol/L, Cl: 123 mmol/L, BUN: 28 mg/dL, creatinine: 4.78 mg/dL, Ca: 8.3 mg/dL, GFR: 55 mL/min.  IVF: D5 @ 100 mL/hr (408 kcal).    NUTRITION - FOCUSED PHYSICAL EXAM:  Completed; no muscle or fat wasting, no edema at this time.   Diet Order:   Diet Order           Diet full liquid Room service appropriate? Yes; Fluid consistency: Thin  Diet effective now          EDUCATION NEEDS:   No education needs have been identified at this time  Skin:  Skin Assessment: Skin Integrity Issues: Skin Integrity Issues:: Incisions Incisions: R groin (7/1)  Last BM:  6/26 (date of admission)  Height:   Ht Readings from Last 1 Encounters:  10/17/17 5\' 11"  (1.803 m)    Weight:   Wt Readings from Last 1 Encounters:  10/17/17 176 lb 9.4 oz (80.1 kg)    Ideal Body Weight:  78.18 kg  BMI:  Body mass index is 24.63 kg/m.  Estimated Nutritional Needs:   Kcal:  2000-2240 (25-28 kcal/kg)  Protein:  96-112 grams (1.2-1.4 grams/kg)  Fluid:  >/= 2 L/day     Trenton Gammon, MS, RD, LDN, [facility] Inpatient Clinical Dietitian Pager # [phone] After hours/weekend pager # [phone]

## 2017-10-23 NOTE — Progress Notes (Signed)
PT Cancellation Note  Patient Details Name: [name] MRN: [id number] DOB: 1955-05-24   Cancelled Treatment:    Reason Eval/Treat Not Completed: Medical issues which prohibited therapy   Rada Hay 10/23/2017, 3:27 PM

## 2017-10-23 NOTE — Progress Notes (Signed)
Subjective: Patient was seen and examined at bedside. Remains nonverbal and not following commands. Has not eaten much in the last several days. No bowel movements in the last several days.  Objective: Vital signs in last 24 hours: Temp:  [98.8 F (37.1 C)-99.5 F (37.5 C)] 99 F (37.2 C) (07/03 0800) Pulse Rate:  [94-97] 95 (07/02 1800) Resp:  [17-35] 23 (07/03 0700) BP: (95-180)/(53-99) 167/55 (07/03 0700) SpO2:  [98 %-100 %] 98 % (07/03 0700) Weight change:  Last BM Date: 10/16/17  PE:dry oral mucosa,not following commands, appears to disheveled GENERAL:Mild pallor,no icterus ABDOMEN:soft, normoactive bowel sounds EXTREMITIES: no edema, no deformity  Lab Results: Results for orders placed or performed during the hospital encounter of 10/16/17 (from the past 48 hour(s))  Hemoglobin and hematocrit, blood     Status: Abnormal   Collection Time: 10/21/17  7:20 PM  Result Value Ref Range   Hemoglobin 9.0 (L) 13.0 - 17.0 g/dL    Comment: DELTA CHECK NOTED POST TRANSFUSION SPECIMEN    HCT 26.8 (L) 39.0 - 52.0 %    Comment: Performed at [facility], [place] [address] [place], [place] [address]  CBC     Status: Abnormal   Collection Time: 10/22/17  3:07 AM  Result Value Ref Range   WBC 7.8 4.0 - 10.5 K/uL   RBC 3.04 (L) 4.22 - 5.81 MIL/uL   Hemoglobin 9.4 (L) 13.0 - 17.0 g/dL   HCT 29.5 (L) 39.0 - 52.0 %   MCV 97.0 78.0 - 100.0 fL   MCH 30.9 26.0 - 34.0 pg   MCHC 31.9 30.0 - 36.0 g/dL   RDW 16.4 (H) 11.5 - 15.5 %   Platelets 311 150 - 400 K/uL    Comment: Performed at [facility], [place] [address], [place], [place] [address]  Basic metabolic panel     Status: Abnormal   Collection Time: 10/22/17  3:07 AM  Result Value Ref Range   Sodium 147 (H) 135 - 145 mmol/L   Potassium 4.2 3.5 - 5.1 mmol/L   Chloride 119 (H) 98 - 111 mmol/L    Comment: Please note change in reference range.   CO2 19 (L) 22 - 32 mmol/L   Glucose, Bld 96 70 - 99  mg/dL    Comment: Please note change in reference range.   BUN 34 (H) 8 - 23 mg/dL    Comment: Please note change in reference range.   Creatinine, Ser 1.32 (H) 0.61 - 1.24 mg/dL   Calcium 8.2 (L) 8.9 - 10.3 mg/dL   GFR calc non Af Amer 56 (L) >60 mL/min   GFR calc Af Amer >60 >60 mL/min    Comment: (NOTE) The eGFR has been calculated using the CKD EPI equation. This calculation has not been validated in all clinical situations. eGFR's persistently <60 mL/min signify possible Chronic Kidney Disease.    Anion gap 9 5 - 15    Comment: Performed at [facility], [place] [address], [place], [place] [address]  CBC     Status: Abnormal   Collection Time: 10/22/17 11:53 AM  Result Value Ref Range   WBC 8.3 4.0 - 10.5 K/uL   RBC 2.92 (L) 4.22 - 5.81 MIL/uL   Hemoglobin 9.4 (L) 13.0 - 17.0 g/dL   HCT 28.3 (L) 39.0 - 52.0 %   MCV 96.9 78.0 - 100.0 fL   MCH 32.2 26.0 - 34.0 pg   MCHC 33.2 30.0 - 36.0 g/dL  RDW 16.4 (H) 11.5 - 15.5 %   Platelets 334 150 - 400 K/uL    Comment: Performed at [facility] [place] [address], [place], [place] [address]  Basic metabolic panel     Status: Abnormal   Collection Time: 10/23/17  3:27 AM  Result Value Ref Range   Sodium 150 (H) 135 - 145 mmol/L   Potassium 4.0 3.5 - 5.1 mmol/L   Chloride 123 (H) 98 - 111 mmol/L    Comment: Please note change in reference range.   CO2 22 22 - 32 mmol/L   Glucose, Bld 129 (H) 70 - 99 mg/dL    Comment: Please note change in reference range.   BUN 28 (H) 8 - 23 mg/dL    Comment: Please note change in reference range.   Creatinine, Ser 1.35 (H) 0.61 - 1.24 mg/dL   Calcium 8.3 (L) 8.9 - 10.3 mg/dL   GFR calc non Af Amer 55 (L) >60 mL/min   GFR calc Af Amer >60 >60 mL/min    Comment: (NOTE) The eGFR has been calculated using the CKD EPI equation. This calculation has not been validated in all clinical situations. eGFR's persistently <60 mL/min signify possible Chronic  Kidney Disease.    Anion gap 5 5 - 15    Comment: Performed at [facility], [place] [address], [place], [place] [address]    Studies/Results: Ir Angiogram Visceral Selective  Result Date: 10/22/2017 INDICATION: 62 year old male with a history of acute, emergent upper GI bleed, with endoscopy demonstrating duodenal ulcer EXAM: SELECTIVE VISCERAL ARTERIOGRAPHY; IR ULTRASOUND GUIDANCE VASC ACCESS RIGHT; ADDITIONAL ARTERIOGRAPHY; IR EMBO ART VEN HEMORR LYMPH EXTRAV INC GUIDE ROADMAPPING MEDICATIONS: None ANESTHESIA/SEDATION: Moderate (conscious) sedation was employed during this procedure. A total of Versed 2.0 mg and Fentanyl 100 mcg was administered intravenously. Moderate Sedation Time: 40 minutes. The patient's level of consciousness and vital signs were monitored continuously by radiology nursing throughout the procedure under my direct supervision. CONTRAST:  60 cc Isovue FLUOROSCOPY TIME:  Fluoroscopy Time: 9 minutes 42 seconds (346 mGy). COMPLICATIONS: None PROCEDURE: Informed consent was obtained from the patient following explanation of the procedure, risks, benefits and alternatives. The patient understands, agrees and consents for the procedure. All questions were addressed. A time out was performed prior to the initiation of the procedure. Maximal barrier sterile technique utilized including caps, mask, sterile gowns, sterile gloves, large sterile drape, hand hygiene, and Betadine prep. Ultrasound survey of the right inguinal region was performed with images stored and sent to PACs, confirming patency of the vessel. A micropuncture needle was used access the right common femoral artery under ultrasound. With excellent arterial blood flow returned, and an .018 micro wire was passed through the needle, observed enter the abdominal aorta under fluoroscopy. The needle was removed, and a micropuncture sheath was placed over the wire. The inner dilator and wire were removed, and an 035  [place] wire was advanced under fluoroscopy into the abdominal aorta. The sheath was removed and a standard 5 [place] vascular sheath was placed. The dilator was removed and the sheath was flushed. C2 Cobra catheter was advanced over the [place] wire to the aorta. Catheter was use elect the celiac artery. Angiogram was performed. Glidewire was used to navigate the [place] catheter into the common hepatic artery. Angiogram was performed. Microcatheter was then used to navigate a microwire into the gastroduodenal artery. Angiogram was performed. Empiric embolization was then performed with a coil mass, coiling the gastroduodenal artery to the  origin. Catheters and wires were removed. Exoseal was deployed at the right common femoral artery for hemostasis. Patient tolerated the procedure well and remained hemodynamically stable throughout. No complications were encountered and no significant blood loss. FINDINGS: Ultrasound demonstrates patency the right common femoral artery. Angiogram of the celiac artery demonstrates typical anatomy, with the celiac artery contributing to splenic artery and common hepatic artery. Gastroduodenal artery arises from the common hepatic artery. Angiogram of the gastroduodenal artery demonstrates no tumor blush, extravasation, pseudoaneurysm. Duodenal branches originate from the mid segment of the descending gastroduodenal artery. Gastroepiploic artery is patent. Status post coil embolization there is no flow into the branches of the gastroduodenal artery, with slight filling at the stump and no collateral flow to the mid segment. IMPRESSION: Status post ultrasound guided access right common femoral artery for mesenteric angiogram and empiric embolization of gastroduodenal artery, as a target contributing to emergent, life-threatening upper GI hemorrhage. Status post Exoseal deployment for hemostasis. Signed, Dulcy Fanny. Dellia Nims, RPVI [facility] Special[facility]  [facility] Radiology Electronically Signed   By: Corrie Mckusick D.O.   On: 10/22/2017 09:02   Ir Angiogram Selective Each Additional Vessel  Result Date: 10/22/2017 INDICATION: 61 year old male with a history of acute, emergent upper GI bleed, with endoscopy demonstrating duodenal ulcer EXAM: SELECTIVE VISCERAL ARTERIOGRAPHY; IR ULTRASOUND GUIDANCE VASC ACCESS RIGHT; ADDITIONAL ARTERIOGRAPHY; IR EMBO ART VEN HEMORR LYMPH EXTRAV INC GUIDE ROADMAPPING MEDICATIONS: None ANESTHESIA/SEDATION: Moderate (conscious) sedation was employed during this procedure. A total of Versed 2.0 mg and Fentanyl 100 mcg was administered intravenously. Moderate Sedation Time: 40 minutes. The patient's level of consciousness and vital signs were monitored continuously by radiology nursing throughout the procedure under my direct supervision. CONTRAST:  60 cc Isovue FLUOROSCOPY TIME:  Fluoroscopy Time: 9 minutes 42 seconds (346 mGy). COMPLICATIONS: None PROCEDURE: Informed consent was obtained from the patient following explanation of the procedure, risks, benefits and alternatives. The patient understands, agrees and consents for the procedure. All questions were addressed. A time out was performed prior to the initiation of the procedure. Maximal barrier sterile technique utilized including caps, mask, sterile gowns, sterile gloves, large sterile drape, hand hygiene, and Betadine prep. Ultrasound survey of the right inguinal region was performed with images stored and sent to PACs, confirming patency of the vessel. A micropuncture needle was used access the right common femoral artery under ultrasound. With excellent arterial blood flow returned, and an .018 micro wire was passed through the needle, observed enter the abdominal aorta under fluoroscopy. The needle was removed, and a micropuncture sheath was placed over the wire. The inner dilator and wire were removed, and an 035 Bentson wire was advanced under fluoroscopy into the  abdominal aorta. The sheath was removed and a standard 5 [place] vascular sheath was placed. The dilator was removed and the sheath was flushed. C2 Cobra catheter was advanced over the [place]ents[place] wire to the aorta. Catheter was use elect the celiac artery. Angiogram was performed. Glidewire was used to navigate the [place] catheter into the common hepatic artery. Angiogram was performed. Microcatheter was then used to navigate a microwire into the gastroduodenal artery. Angiogram was performed. Empiric embolization was then performed with a coil mass, coiling the gastroduodenal artery to the origin. Catheters and wires were removed. Exoseal was deployed at the right common femoral artery for hemostasis. Patient tolerated the procedure well and remained hemodynamically stable throughout. No complications were encountered and no significant blood loss. FINDINGS: Ultrasound demonstrates patency the right common femoral artery. Angiogram  of the celiac artery demonstrates typical anatomy, with the celiac artery contributing to splenic artery and common hepatic artery. Gastroduodenal artery arises from the common hepatic artery. Angiogram of the gastroduodenal artery demonstrates no tumor blush, extravasation, pseudoaneurysm. Duodenal branches originate from the mid segment of the descending gastroduodenal artery. Gastroepiploic artery is patent. Status post coil embolization there is no flow into the branches of the gastroduodenal artery, with slight filling at the stump and no collateral flow to the mid segment. IMPRESSION: Status post ultrasound guided access right common femoral artery for mesenteric angiogram and empiric embolization of gastroduodenal artery, as a target contributing to emergent, life-threatening upper GI hemorrhage. Status post Exoseal deployment for hemostasis. Signed, Dulcy Fanny. Dellia Nims, RPVI V[facility] Interventional Radiology Specialists [facility] Radiology Electronically Signed   By: Corrie Mckusick D.O.   On: 10/22/2017 09:02   Ir Angiogram Selective Each Additional Vessel  Result Date: 10/22/2017 INDICATION: 62 year old male with a history of acute, emergent upper GI bleed, with endoscopy demonstrating duodenal ulcer EXAM: SELECTIVE VISCERAL ARTERIOGRAPHY; IR ULTRASOUND GUIDANCE VASC ACCESS RIGHT; ADDITIONAL ARTERIOGRAPHY; IR EMBO ART VEN HEMORR LYMPH EXTRAV INC GUIDE ROADMAPPING MEDICATIONS: None ANESTHESIA/SEDATION: Moderate (conscious) sedation was employed during this procedure. A total of Versed 2.0 mg and Fentanyl 100 mcg was administered intravenously. Moderate Sedation Time: 40 minutes. The patient's level of consciousness and vital signs were monitored continuously by radiology nursing throughout the procedure under my direct supervision. CONTRAST:  60 cc Isovue FLUOROSCOPY TIME:  Fluoroscopy Time: 9 minutes 42 seconds (346 mGy). COMPLICATIONS: None PROCEDURE: Informed consent was obtained from the patient following explanation of the procedure, risks, benefits and alternatives. The patient understands, agrees and consents for the procedure. All questions were addressed. A time out was performed prior to the initiation of the procedure. Maximal barrier sterile technique utilized including caps, mask, sterile gowns, sterile gloves, large sterile drape, hand hygiene, and Betadine prep. Ultrasound survey of the right inguinal region was performed with images stored and sent to PACs, confirming patency of the vessel. A micropuncture needle was used access the right common femoral artery under ultrasound. With excellent arterial blood flow returned, and an .018 micro wire was passed through the needle, observed enter the abdominal aorta under fluoroscopy. The needle was removed, and a micropuncture sheath was placed over the wire. The inner dilator and wire were removed, and an 035 Bentson wire was advanced under fluoroscopy into the abdominal aorta. The sheath was removed and a standard 5  [place] vascular sheath was placed. The dilator was removed and the sheath was flushed. C2 Cobra catheter was advanced over the [place]ents[place] wire to the aorta. Catheter was use elect the celiac artery. Angiogram was performed. Glidewire was used to navigate the [place] catheter into the common hepatic artery. Angiogram was performed. Microcatheter was then used to navigate a microwire into the gastroduodenal artery. Angiogram was performed. Empiric embolization was then performed with a coil mass, coiling the gastroduodenal artery to the origin. Catheters and wires were removed. Exoseal was deployed at the right common femoral artery for hemostasis. Patient tolerated the procedure well and remained hemodynamically stable throughout. No complications were encountered and no significant blood loss. FINDINGS: Ultrasound demonstrates patency the right common femoral artery. Angiogram of the celiac artery demonstrates typical anatomy, with the celiac artery contributing to splenic artery and common hepatic artery. Gastroduodenal artery arises from the common hepatic artery. Angiogram of the gastroduodenal artery demonstrates no tumor blush, extravasation, pseudoaneurysm. Duodenal branches originate from the mid segment of  the descending gastroduodenal artery. Gastroepiploic artery is patent. Status post coil embolization there is no flow into the branches of the gastroduodenal artery, with slight filling at the stump and no collateral flow to the mid segment. IMPRESSION: Status post ultrasound guided access right common femoral artery for mesenteric angiogram and empiric embolization of gastroduodenal artery, as a target contributing to emergent, life-threatening upper GI hemorrhage. Status post Exoseal deployment for hemostasis. Signed, Dulcy Fanny. Dellia Nims, RPVI V[facility] Interventional Radiology Specialists [facility] Radiology Electronically Signed   By: Corrie Mckusick D.O.   On: 10/22/2017 09:02   Ir US Guide Vasc Access  Right  Result Date: 10/22/2017 INDICATION: 62 year old male with a history of acute, emergent upper GI bleed, with endoscopy demonstrating duodenal ulcer EXAM: SELECTIVE VISCERAL ARTERIOGRAPHY; IR ULTRASOUND GUIDANCE VASC ACCESS RIGHT; ADDITIONAL ARTERIOGRAPHY; IR EMBO ART VEN HEMORR LYMPH EXTRAV INC GUIDE ROADMAPPING MEDICATIONS: None ANESTHESIA/SEDATION: Moderate (conscious) sedation was employed during this procedure. A total of Versed 2.0 mg and Fentanyl 100 mcg was administered intravenously. Moderate Sedation Time: 40 minutes. The patient's level of consciousness and vital signs were monitored continuously by radiology nursing throughout the procedure under my direct supervision. CONTRAST:  60 cc Isovue FLUOROSCOPY TIME:  Fluoroscopy Time: 9 minutes 42 seconds (346 mGy). COMPLICATIONS: None PROCEDURE: Informed consent was obtained from the patient following explanation of the procedure, risks, benefits and alternatives. The patient understands, agrees and consents for the procedure. All questions were addressed. A time out was performed prior to the initiation of the procedure. Maximal barrier sterile technique utilized including caps, mask, sterile gowns, sterile gloves, large sterile drape, hand hygiene, and Betadine prep. Ultrasound survey of the right inguinal region was performed with images stored and sent to PACs, confirming patency of the vessel. A micropuncture needle was used access the right common femoral artery under ultrasound. With excellent arterial blood flow returned, and an .018 micro wire was passed through the needle, observed enter the abdominal aorta under fluoroscopy. The needle was removed, and a micropuncture sheath was placed over the wire. The inner dilator and wire were removed, and an 035 Bentson wire was advanced under fluoroscopy into the abdominal aorta. The sheath was removed and a standard 5 [place] vascular sheath was placed. The dilator was removed and the sheath was  flushed. C2 Cobra catheter was advanced over the [place]ents[place] wire to the aorta. Catheter was use elect the celiac artery. Angiogram was performed. Glidewire was used to navigate the [place] catheter into the common hepatic artery. Angiogram was performed. Microcatheter was then used to navigate a microwire into the gastroduodenal artery. Angiogram was performed. Empiric embolization was then performed with a coil mass, coiling the gastroduodenal artery to the origin. Catheters and wires were removed. Exoseal was deployed at the right common femoral artery for hemostasis. Patient tolerated the procedure well and remained hemodynamically stable throughout. No complications were encountered and no significant blood loss. FINDINGS: Ultrasound demonstrates patency the right common femoral artery. Angiogram of the celiac artery demonstrates typical anatomy, with the celiac artery contributing to splenic artery and common hepatic artery. Gastroduodenal artery arises from the common hepatic artery. Angiogram of the gastroduodenal artery demonstrates no tumor blush, extravasation, pseudoaneurysm. Duodenal branches originate from the mid segment of the descending gastroduodenal artery. Gastroepiploic artery is patent. Status post coil embolization there is no flow into the branches of the gastroduodenal artery, with slight filling at the stump and no collateral flow to the mid segment. IMPRESSION: Status post ultrasound guided access right common femoral  artery for mesenteric angiogram and empiric embolization of gastroduodenal artery, as a target contributing to emergent, life-threatening upper GI hemorrhage. Status post Exoseal deployment for hemostasis. Signed, Dulcy Fanny. Dellia Nims, RPVI Vascular and Interventional Radiology Specialists [facility] Radiology Electronically Signed   By: Corrie Mckusick D.O.   On: 10/22/2017 09:02   Ir Embo Otilio Saber Hemorr Lymph [employer] Guide Roadmapping  Result Date: 10/22/2017 INDICATION:  62 year old male with a history of acute, emergent upper GI bleed, with endoscopy demonstrating duodenal ulcer EXAM: SELECTIVE VISCERAL ARTERIOGRAPHY; IR ULTRASOUND GUIDANCE VASC ACCESS RIGHT; ADDITIONAL ARTERIOGRAPHY; IR EMBO ART VEN HEMORR LYMPH EXTRAV INC GUIDE ROADMAPPING MEDICATIONS: None ANESTHESIA/SEDATION: Moderate (conscious) sedation was employed during this procedure. A total of Versed 2.0 mg and Fentanyl 100 mcg was administered intravenously. Moderate Sedation Time: 40 minutes. The patient's level of consciousness and vital signs were monitored continuously by radiology nursing throughout the procedure under my direct supervision. CONTRAST:  60 cc Isovue FLUOROSCOPY TIME:  Fluoroscopy Time: 9 minutes 42 seconds (346 mGy). COMPLICATIONS: None PROCEDURE: Informed consent was obtained from the patient following explanation of the procedure, risks, benefits and alternatives. The patient understands, agrees and consents for the procedure. All questions were addressed. A time out was performed prior to the initiation of the procedure. Maximal barrier sterile technique utilized including caps, mask, sterile gowns, sterile gloves, large sterile drape, hand hygiene, and Betadine prep. Ultrasound survey of the right inguinal region was performed with images stored and sent to PACs, confirming patency of the vessel. A micropuncture needle was used access the right common femoral artery under ultrasound. With excellent arterial blood flow returned, and an .018 micro wire was passed through the needle, observed enter the abdominal aorta under fluoroscopy. The needle was removed, and a micropuncture sheath was placed over the wire. The inner dilator and wire were removed, and an 035 Bentson wire was advanced under fluoroscopy into the abdominal aorta. The sheath was removed and a standard 5 [place] vascular sheath was placed. The dilator was removed and the sheath was flushed. C2 Cobra catheter was advanced over the  [place] wire to the aorta. Catheter was use elect the celiac artery. Angiogram was performed. Glidewire was used to navigate the [place] catheter into the common hepatic artery. Angiogram was performed. Microcatheter was then used to navigate a microwire into the gastroduodenal artery. Angiogram was performed. Empiric embolization was then performed with a coil mass, coiling the gastroduodenal artery to the origin. Catheters and wires were removed. Exoseal was deployed at the right common femoral artery for hemostasis. Patient tolerated the procedure well and remained hemodynamically stable throughout. No complications were encountered and no significant blood loss. FINDINGS: Ultrasound demonstrates patency the right common femoral artery. Angiogram of the celiac artery demonstrates typical anatomy, with the celiac artery contributing to splenic artery and common hepatic artery. Gastroduodenal artery arises from the common hepatic artery. Angiogram of the gastroduodenal artery demonstrates no tumor blush, extravasation, pseudoaneurysm. Duodenal branches originate from the mid segment of the descending gastroduodenal artery. Gastroepiploic artery is patent. Status post coil embolization there is no flow into the branches of the gastroduodenal artery, with slight filling at the stump and no collateral flow to the mid segment. IMPRESSION: Status post ultrasound guided access right common femoral artery for mesenteric angiogram and empiric embolization of gastroduodenal artery, as a target contributing to emergent, life-threatening upper GI hemorrhage. Status post Exoseal deployment for hemostasis. Signed, Dulcy Fanny. Dellia Nims, [place] [facility] Inter[facility] Radiology Specialists [facility] Radiology Electronically  Signed   By: Corrie Mckusick D.O.   On: 10/22/2017 09:02    Medications: I have reviewed the patient's current medications.  Assessment: 1.IR guided embolization of GDA to control bleeding from duodenal  bulb ulcer. Stable hemoglobin thereafter 9/9.4/9.4, DowntrendingBUn from 34 to 28.  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 NG tube placement and initiation of feeding,I have placed a consult to dietitian to determine the formula and feeding rate. Continue thiamine 461 mg IV, folic acid 1 mg IV and will reorder IV multivitamin and Ringer's lactate X 1,after NG tube placement, recommend giving multivitamin through NG tube.   Ronnette Juniper 10/23/2017, 10:04 AM   Pager [phone] If no answer or after 5 PM call [phone]

## 2017-10-23 NOTE — Progress Notes (Signed)
PROGRESS NOTE    [name]  ZOX:[id number] DOB: December 01, 1955 DOA: 10/16/2017 PCP: Rinaldo Cloud, MD     Brief Narrative:  [name] a 62 y.o.malewith medical history significant ofMS (follows with Dr. Rogene [place] disorder, alcohol and tobacco abuse, presented to the emergency department complaining of abdominal pain with nausea and vomiting. Patient is poor historian. He reports to 2-3 days of abdominal pain associated with nausea and vomiting. Patient reported epigastric pain started approximately 2 weeks ago which has been worse with eating. Denies diarrhea, constipation and fever or chills. Last alcohol use 3 days ago. Normally drinks occasionally but can drink up to 12 pack a day. Other associated symptoms include weakness/faint.Denies NSAID/ASA use, no history of GI bleeding in the past. Patient denies dark stools.  Patient underwent EGD which revealed large, deep duodenal bulb ulcer.  Patient underwent empiric embolization by IR on 7/1.  Hospitalization further complicated by altered mental status.  New events last 24 hours / Subjective: No acute events overnight.  Patient remains altered although alert to voice.  He is unable to give any history or answer any questions appropriately.  Assessment & Plan:   Principal Problem:   Symptomatic anemia Active Problems:   MS (multiple sclerosis) (HCC)   Bipolar affective disorder, depressed, severe (HCC)   Bipolar I disorder (HCC)   Upper GI bleeding   GI bleed   Symptomatic anemia/ Upper GI bleed -Presented with weakness, palpitations and soft blood pressure -Chronic use of alcohol; denies use of NSAIDs -Hemoglobin approximately 14-15, on admission was 10.1 with positive FOBT -Was on Protonix drip for 48 hours, and transitioned to IV BID -Anemia panel showed an iron of 77, ferritin of 122, vitamin B12 180, folate 11.4 -Given that vitamin B12 is on the low normal side, dose of B12 given -Gastroenterology  consulted and appreciated, status post EGD 6/27: Showed very large, deep and created duodenal bulb/duodenal sweep ulcer with pigmented spots.  LA grade D esophagitis -S/p empiric embolization of GDA on 10/21/2017 by IR  -No further bleeding episodes noted, Hgb stable for now. Continue to monitor   Acute metabolic encephalopathy -Unclear etiology, and chart review, it appears that patient started to decline around 7/1.  Previously, patient was alert and oriented x3.  There was concern for alcohol withdrawal contributing to his encephalopathy.  I discussed with his ex-wife, at baseline, patient is alert and oriented, independent of daily activities although has been declining  Hypernatremia -Poor oral intake due to mentation.  NG tube ordered by GI service.  Free water flushes ordered.  Continue to monitor BMP  Acute kidney injury  -Baseline creatinine approximately 0.9-1 -Currently receiving D5 IVF   Multiple sclerosis -Neurology, Dr. Epimenio Foot -Stable, continue tecfidera - on hold due to patient not alert enough to take in PO meds   Bipolar disorder -Continue Abilify, Wellbutrin, Lexapro, lithium. Ex-wife reports patient has not been on lithium in over a month, unsure about other 3 medications if he has been taking it on a regular basis.  Patient's last dose of these medications were 6/30.  I will discontinue these for now until mentation improves  Alcohol abuse with acute delirium -Last alcoholic drink was over a week ago.  Doubt acute withdrawal at this point.   DVT prophylaxis: SCD Code Status: Full Family Communication: Spoke with ex-wife over the phone. They have 1 son together who is 42 years old. Patient also has a nephew but no other family  Disposition Plan: Pending improvement in mentation and  stabilization in GIB   Consultants:   GI  IR  Procedures:   EGD  Empiric embolization of GDA   Antimicrobials:  Anti-infectives (From admission, onward)   None        Objective: Vitals:   10/23/17 0645 10/23/17 0700 10/23/17 0800 10/23/17 1200  BP: (!) 163/72 (!) 167/55    Pulse:      Resp: (!) 25 (!) 23    Temp:   99 F (37.2 C) (!) 100.5 F (38.1 C)  TempSrc:   Axillary Axillary  SpO2: 100% 98%    Weight:      Height:        Intake/Output Summary (Last 24 hours) at 10/23/2017 1248 Last data filed at 10/23/2017 0600 Gross per 24 hour  Intake 2076.25 ml  Output 460 ml  Net 1616.25 ml   Filed Weights   10/17/17 0859  Weight: 80.1 kg (176 lb 9.4 oz)    Examination:  General exam: Appears calm Respiratory system: Clear to auscultation. Respiratory effort normal. Cardiovascular system: S1 & S2 heard, RRR. No JVD, murmurs, rubs, gallops or clicks. No pedal edema. Gastrointestinal system: Abdomen is nondistended, soft and nontender. No organomegaly or masses felt. Normal bowel sounds heard. Central nervous system: Alert but not oriented, does not answer questions appropriately, mumbles incoherently Extremities: Symmetric Skin: No rashes, lesions or ulcers  Data Reviewed: I have personally reviewed following labs and imaging studies  CBC: Recent Labs  Lab 10/19/17 0544  10/20/17 0431 10/20/17 1212 10/21/17 0456 10/21/17 1920 10/22/17 0307 10/22/17 1153  WBC 4.4  --  4.9  --  5.5  --  7.8 8.3  HGB 9.0*   < > 8.2* 7.9* 6.8* 9.0* 9.4* 9.4*  HCT 26.9*   < > 24.6* 24.0* 21.0* 26.8* 29.5* 28.3*  MCV 95.1  --  97.2  --  98.6  --  97.0 96.9  PLT 231  --  269  --  314  --  311 334   < > = values in this interval not displayed.   Basic Metabolic Panel: Recent Labs  Lab 10/17/17 0328 10/18/17 0319 10/20/17 0431 10/21/17 0456 10/22/17 0307 10/23/17 0327  NA 137 141 143 144 147* 150*  K 3.8 4.2 4.2 4.1 4.2 4.0  CL 109 114* 116* 117* 119* 123*  CO2 21* 23 22 21* 19* 22  GLUCOSE 106* 106* 92 108* 96 129*  BUN 40* 22 26* 44* 34* 28*  CREATININE 1.31* 1.01 1.09 1.42* 1.32* 1.35*  CALCIUM 8.0* 7.8* 8.0* 8.0* 8.2* 8.3*  MG 2.1  --    --   --   --   --    GFR: Estimated Creatinine Clearance: 60.4 mL/min (A) (by C-G formula based on SCr of 1.35 mg/dL (H)). Liver Function Tests: Recent Labs  Lab 10/18/17 0319  AST 19  ALT 12  ALKPHOS 37*  BILITOT 0.5  PROT 4.7*  ALBUMIN 2.5*   No results for input(s): LIPASE, AMYLASE in the last 168 hours. No results for input(s): AMMONIA in the last 168 hours. Coagulation Profile: No results for input(s): INR, PROTIME in the last 168 hours. Cardiac Enzymes: No results for input(s): CKTOTAL, CKMB, CKMBINDEX, TROPONINI in the last 168 hours. BNP (last 3 results) No results for input(s): PROBNP in the last 8760 hours. HbA1C: No results for input(s): HGBA1C in the last 72 hours. CBG: No results for input(s): GLUCAP in the last 168 hours. Lipid Profile: No results for input(s): CHOL, HDL, LDLCALC, TRIG, CHOLHDL, LDLDIRECT in  the last 72 hours. Thyroid Function Tests: No results for input(s): TSH, T4TOTAL, FREET4, T3FREE, THYROIDAB in the last 72 hours. Anemia Panel: No results for input(s): VITAMINB12, FOLATE, FERRITIN, TIBC, IRON, RETICCTPCT in the last 72 hours. Sepsis Labs: No results for input(s): PROCALCITON, LATICACIDVEN in the last 168 hours.  Recent Results (from the past 240 hour(s))  MRSA PCR Screening     Status: None   Collection Time: 10/16/17  3:21 PM  Result Value Ref Range Status   MRSA by PCR NEGATIVE NEGATIVE Final    Comment:        The GeneXpert MRSA Assay (FDA approved for NASAL specimens only), is one component of a comprehensive MRSA colonization surveillance program. It is not intended to diagnose MRSA infection nor to guide or monitor treatment for MRSA infections. Performed at [facility], [address] [place], [place] [address]        Radiology Studies: Ir Angiogram Visceral Selective  Result Date: 10/22/2017 INDICATION: 62 year old male with a history of acute, emergent upper GI bleed, with endoscopy  demonstrating duodenal ulcer EXAM: SELECTIVE VISCERAL ARTERIOGRAPHY; IR ULTRASOUND GUIDANCE VASC ACCESS RIGHT; ADDITIONAL ARTERIOGRAPHY; IR EMBO ART VEN HEMORR LYMPH EXTRAV INC GUIDE ROADMAPPING MEDICATIONS: None ANESTHESIA/SEDATION: Moderate (conscious) sedation was employed during this procedure. A total of Versed 2.0 mg and Fentanyl 100 mcg was administered intravenously. Moderate Sedation Time: 40 minutes. The patient's level of consciousness and vital signs were monitored continuously by radiology nursing throughout the procedure under my direct supervision. CONTRAST:  60 cc Isovue FLUOROSCOPY TIME:  Fluoroscopy Time: 9 minutes 42 seconds (346 mGy). COMPLICATIONS: None PROCEDURE: Informed consent was obtained from the patient following explanation of the procedure, risks, benefits and alternatives. The patient understands, agrees and consents for the procedure. All questions were addressed. A time out was performed prior to the initiation of the procedure. Maximal barrier sterile technique utilized including caps, mask, sterile gowns, sterile gloves, large sterile drape, hand hygiene, and Betadine prep. Ultrasound survey of the right inguinal region was performed with images stored and sent to PACs, confirming patency of the vessel. A micropuncture needle was used access the right common femoral artery under ultrasound. With excellent arterial blood flow returned, and an .018 micro wire was passed through the needle, observed enter the abdominal aorta under fluoroscopy. The needle was removed, and a micropuncture sheath was placed over the wire. The inner dilator and wire were removed, and an 035 [place] wire was advanced under fluoroscopy into the abdominal aorta. The sheath was removed and a standard 5 [place] vascular sheath was placed. The dilator was removed and the sheath was flushed. C2 Cobra catheter was advanced over the Bentson wire to the aorta. Catheter was use elect the celiac artery. Angiogram was  performed. Glidewire was used to navigate the [place] catheter into the common hepatic artery. Angiogram was performed. Microcatheter was then used to navigate a microwire into the gastroduodenal artery. Angiogram was performed. Empiric embolization was then performed with a coil mass, coiling the gastroduodenal artery to the origin. Catheters and wires were removed. Exoseal was deployed at the right common femoral artery for hemostasis. Patient tolerated the procedure well and remained hemodynamically stable throughout. No complications were encountered and no significant blood loss. FINDINGS: Ultrasound demonstrates patency the right common femoral artery. Angiogram of the celiac artery demonstrates typical anatomy, with the celiac artery contributing to splenic artery and common hepatic artery. Gastroduodenal artery arises from the common hepatic artery. Angiogram of the gastroduodenal artery demonstrates no  tumor blush, extravasation, pseudoaneurysm. Duodenal branches originate from the mid segment of the descending gastroduodenal artery. Gastroepiploic artery is patent. Status post coil embolization there is no flow into the branches of the gastroduodenal artery, with slight filling at the stump and no collateral flow to the mid segment. IMPRESSION: Status post ultrasound guided access right common femoral artery for mesenteric angiogram and empiric embolization of gastroduodenal artery, as a target contributing to emergent, life-threatening upper GI hemorrhage. Status post Exoseal deployment for hemostasis. Signed, Yvone Neu. Reyne Dumas, RPVI Vascular and Interventional Radiology Specialists [facility] Radiology Electronically Signed   By: Gilmer Mor D.O.   On: 10/22/2017 09:02   Ir Angiogram Selective Each Additional Vessel  Result Date: 10/22/2017 INDICATION: 62 year old male with a history of acute, emergent upper GI bleed, with endoscopy demonstrating duodenal ulcer EXAM: SELECTIVE VISCERAL  ARTERIOGRAPHY; IR ULTRASOUND GUIDANCE VASC ACCESS RIGHT; ADDITIONAL ARTERIOGRAPHY; IR EMBO ART VEN HEMORR LYMPH EXTRAV INC GUIDE ROADMAPPING MEDICATIONS: None ANESTHESIA/SEDATION: Moderate (conscious) sedation was employed during this procedure. A total of Versed 2.0 mg and Fentanyl 100 mcg was administered intravenously. Moderate Sedation Time: 40 minutes. The patient's level of consciousness and vital signs were monitored continuously by radiology nursing throughout the procedure under my direct supervision. CONTRAST:  60 cc Isovue FLUOROSCOPY TIME:  Fluoroscopy Time: 9 minutes 42 seconds (346 mGy). COMPLICATIONS: None PROCEDURE: Informed consent was obtained from the patient following explanation of the procedure, risks, benefits and alternatives. The patient understands, agrees and consents for the procedure. All questions were addressed. A time out was performed prior to the initiation of the procedure. Maximal barrier sterile technique utilized including caps, mask, sterile gowns, sterile gloves, large sterile drape, hand hygiene, and Betadine prep. Ultrasound survey of the right inguinal region was performed with images stored and sent to PACs, confirming patency of the vessel. A micropuncture needle was used access the right common femoral artery under ultrasound. With excellent arterial blood flow returned, and an .018 micro wire was passed through the needle, observed enter the abdominal aorta under fluoroscopy. The needle was removed, and a micropuncture sheath was placed over the wire. The inner dilator and wire were removed, and an 035 Bentson wire was advanced under fluoroscopy into the abdominal aorta. The sheath was removed and a standard 5 [place] vascular sheath was placed. The dilator was removed and the sheath was flushed. C2 Cobra catheter was advanced over the Bents[place] wire to the aorta. Catheter was use elect the celiac artery. Angiogram was performed. Glidewire was used to navigate the [place]  catheter into the common hepatic artery. Angiogram was performed. Microcatheter was then used to navigate a microwire into the gastroduodenal artery. Angiogram was performed. Empiric embolization was then performed with a coil mass, coiling the gastroduodenal artery to the origin. Catheters and wires were removed. Exoseal was deployed at the right common femoral artery for hemostasis. Patient tolerated the procedure well and remained hemodynamically stable throughout. No complications were encountered and no significant blood loss. FINDINGS: Ultrasound demonstrates patency the right common femoral artery. Angiogram of the celiac artery demonstrates typical anatomy, with the celiac artery contributing to splenic artery and common hepatic artery. Gastroduodenal artery arises from the common hepatic artery. Angiogram of the gastroduodenal artery demonstrates no tumor blush, extravasation, pseudoaneurysm. Duodenal branches originate from the mid segment of the descending gastroduodenal artery. Gastroepiploic artery is patent. Status post coil embolization there is no flow into the branches of the gastroduodenal artery, with slight filling at the stump and no collateral flow to  the mid segment. IMPRESSION: Status post ultrasound guided access right common femoral artery for mesenteric angiogram and empiric embolization of gastroduodenal artery, as a target contributing to emergent, life-threatening upper GI hemorrhage. Status post Exoseal deployment for hemostasis. Signed, Yvone Neu. Reyne Dumas, RPVI Vascular and Interventional Radiology Specialists [facility] Radiology Electronically Signed   By: Gilmer Mor D.O.   On: 10/22/2017 09:02   Ir Angiogram Selective Each Additional Vessel  Result Date: 10/22/2017 INDICATION: 62 year old male with a history of acute, emergent upper GI bleed, with endoscopy demonstrating duodenal ulcer EXAM: SELECTIVE VISCERAL ARTERIOGRAPHY; IR ULTRASOUND GUIDANCE VASC ACCESS RIGHT;  ADDITIONAL ARTERIOGRAPHY; IR EMBO ART VEN HEMORR LYMPH EXTRAV INC GUIDE ROADMAPPING MEDICATIONS: None ANESTHESIA/SEDATION: Moderate (conscious) sedation was employed during this procedure. A total of Versed 2.0 mg and Fentanyl 100 mcg was administered intravenously. Moderate Sedation Time: 40 minutes. The patient's level of consciousness and vital signs were monitored continuously by radiology nursing throughout the procedure under my direct supervision. CONTRAST:  60 cc Isovue FLUOROSCOPY TIME:  Fluoroscopy Time: 9 minutes 42 seconds (346 mGy). COMPLICATIONS: None PROCEDURE: Informed consent was obtained from the patient following explanation of the procedure, risks, benefits and alternatives. The patient understands, agrees and consents for the procedure. All questions were addressed. A time out was performed prior to the initiation of the procedure. Maximal barrier sterile technique utilized including caps, mask, sterile gowns, sterile gloves, large sterile drape, hand hygiene, and Betadine prep. Ultrasound survey of the right inguinal region was performed with images stored and sent to PACs, confirming patency of the vessel. A micropuncture needle was used access the right common femoral artery under ultrasound. With excellent arterial blood flow returned, and an .018 micro wire was passed through the needle, observed enter the abdominal aorta under fluoroscopy. The needle was removed, and a micropuncture sheath was placed over the wire. The inner dilator and wire were removed, and an 035 Bentson wire was advanced under fluoroscopy into the abdominal aorta. The sheath was removed and a standard 5 [place] vascular sheath was placed. The dilator was removed and the sheath was flushed. C2 Cobra catheter was advanced over the Bents[place] wire to the aorta. Catheter was use elect the celiac artery. Angiogram was performed. Glidewire was used to navigate the [place] catheter into the common hepatic artery. Angiogram was  performed. Microcatheter was then used to navigate a microwire into the gastroduodenal artery. Angiogram was performed. Empiric embolization was then performed with a coil mass, coiling the gastroduodenal artery to the origin. Catheters and wires were removed. Exoseal was deployed at the right common femoral artery for hemostasis. Patient tolerated the procedure well and remained hemodynamically stable throughout. No complications were encountered and no significant blood loss. FINDINGS: Ultrasound demonstrates patency the right common femoral artery. Angiogram of the celiac artery demonstrates typical anatomy, with the celiac artery contributing to splenic artery and common hepatic artery. Gastroduodenal artery arises from the common hepatic artery. Angiogram of the gastroduodenal artery demonstrates no tumor blush, extravasation, pseudoaneurysm. Duodenal branches originate from the mid segment of the descending gastroduodenal artery. Gastroepiploic artery is patent. Status post coil embolization there is no flow into the branches of the gastroduodenal artery, with slight filling at the stump and no collateral flow to the mid segment. IMPRESSION: Status post ultrasound guided access right common femoral artery for mesenteric angiogram and empiric embolization of gastroduodenal artery, as a target contributing to emergent, life-threatening upper GI hemorrhage. Status post Exoseal deployment for hemostasis. Signed, Yvone Neu. Loreta Ave DO, RPVI Vascular and  Interventional Radiology Specialists [facility] Radiology Electronically Signed   By: Gilmer Mor D.O.   On: 10/22/2017 09:02   Ir US Guide Vasc Access Right  Result Date: 10/22/2017 INDICATION: 62 year old male with a history of acute, emergent upper GI bleed, with endoscopy demonstrating duodenal ulcer EXAM: SELECTIVE VISCERAL ARTERIOGRAPHY; IR ULTRASOUND GUIDANCE VASC ACCESS RIGHT; ADDITIONAL ARTERIOGRAPHY; IR EMBO ART VEN HEMORR LYMPH EXTRAV INC GUIDE  ROADMAPPING MEDICATIONS: None ANESTHESIA/SEDATION: Moderate (conscious) sedation was employed during this procedure. A total of Versed 2.0 mg and Fentanyl 100 mcg was administered intravenously. Moderate Sedation Time: 40 minutes. The patient's level of consciousness and vital signs were monitored continuously by radiology nursing throughout the procedure under my direct supervision. CONTRAST:  60 cc Isovue FLUOROSCOPY TIME:  Fluoroscopy Time: 9 minutes 42 seconds (346 mGy). COMPLICATIONS: None PROCEDURE: Informed consent was obtained from the patient following explanation of the procedure, risks, benefits and alternatives. The patient understands, agrees and consents for the procedure. All questions were addressed. A time out was performed prior to the initiation of the procedure. Maximal barrier sterile technique utilized including caps, mask, sterile gowns, sterile gloves, large sterile drape, hand hygiene, and Betadine prep. Ultrasound survey of the right inguinal region was performed with images stored and sent to PACs, confirming patency of the vessel. A micropuncture needle was used access the right common femoral artery under ultrasound. With excellent arterial blood flow returned, and an .018 micro wire was passed through the needle, observed enter the abdominal aorta under fluoroscopy. The needle was removed, and a micropuncture sheath was placed over the wire. The inner dilator and wire were removed, and an 035 Bentson wire was advanced under fluoroscopy into the abdominal aorta. The sheath was removed and a standard 5 [place] vascular sheath was placed. The dilator was removed and the sheath was flushed. C2 Cobra catheter was advanced over the [place] wire to the aorta. Catheter was use elect the celiac artery. Angiogram was performed. Glidewire was used to navigate the [place] catheter into the common hepatic artery. Angiogram was performed. Microcatheter was then used to navigate a microwire into the  gastroduodenal artery. Angiogram was performed. Empiric embolization was then performed with a coil mass, coiling the gastroduodenal artery to the origin. Catheters and wires were removed. Exoseal was deployed at the right common femoral artery for hemostasis. Patient tolerated the procedure well and remained hemodynamically stable throughout. No complications were encountered and no significant blood loss. FINDINGS: Ultrasound demonstrates patency the right common femoral artery. Angiogram of the celiac artery demonstrates typical anatomy, with the celiac artery contributing to splenic artery and common hepatic artery. Gastroduodenal artery arises from the common hepatic artery. Angiogram of the gastroduodenal artery demonstrates no tumor blush, extravasation, pseudoaneurysm. Duodenal branches originate from the mid segment of the descending gastroduodenal artery. Gastroepiploic artery is patent. Status post coil embolization there is no flow into the branches of the gastroduodenal artery, with slight filling at the stump and no collateral flow to the mid segment. IMPRESSION: Status post ultrasound guided access right common femoral artery for mesenteric angiogram and empiric embolization of gastroduodenal artery, as a target contributing to emergent, life-threatening upper GI hemorrhage. Status post Exoseal deployment for hemostasis. Signed, Yvone Neu. Reyne Dumas, RPVI Vascular and Interventional Radiology Specialists [facility] Radiology Electronically Signed   By: Gilmer Mor D.O.   On: 10/22/2017 09:02   Ir Embo Art  [facility] Hemorr Lymph [place] [place]  [employer] Guide Roadmapping  Result Date: 10/22/2017 INDICATION: 62 year old male with a history of acute, emergent  upper GI bleed, with endoscopy demonstrating duodenal ulcer EXAM: SELECTIVE VISCERAL ARTERIOGRAPHY; IR ULTRASOUND GUIDANCE VASC ACCESS RIGHT; ADDITIONAL ARTERIOGRAPHY; IR EMBO ART VEN HEMORR LYMPH EXTRAV INC GUIDE ROADMAPPING MEDICATIONS: None  ANESTHESIA/SEDATION: Moderate (conscious) sedation was employed during this procedure. A total of Versed 2.0 mg and Fentanyl 100 mcg was administered intravenously. Moderate Sedation Time: 40 minutes. The patient's level of consciousness and vital signs were monitored continuously by radiology nursing throughout the procedure under my direct supervision. CONTRAST:  60 cc Isovue FLUOROSCOPY TIME:  Fluoroscopy Time: 9 minutes 42 seconds (346 mGy). COMPLICATIONS: None PROCEDURE: Informed consent was obtained from the patient following explanation of the procedure, risks, benefits and alternatives. The patient understands, agrees and consents for the procedure. All questions were addressed. A time out was performed prior to the initiation of the procedure. Maximal barrier sterile technique utilized including caps, mask, sterile gowns, sterile gloves, large sterile drape, hand hygiene, and Betadine prep. Ultrasound survey of the right inguinal region was performed with images stored and sent to PACs, confirming patency of the vessel. A micropuncture needle was used access the right common femoral artery under ultrasound. With excellent arterial blood flow returned, and an .018 micro wire was passed through the needle, observed enter the abdominal aorta under fluoroscopy. The needle was removed, and a micropuncture sheath was placed over the wire. The inner dilator and wire were removed, and an 035 Bentson wire was advanced under fluoroscopy into the abdominal aorta. The sheath was removed and a standard 5 [place] vascular sheath was placed. The dilator was removed and the sheath was flushed. C2 Cobra catheter was advanced over the Bentson wire to the aorta. Catheter was use elect the celiac artery. Angiogram was performed. Glidewire was used to navigate the [place]varez catheter into the common hepatic artery. Angiogram was performed. Microcatheter was then used to navigate a microwire into the gastroduodenal artery. Angiogram  was performed. Empiric embolization was then performed with a coil mass, coiling the gastroduodenal artery to the origin. Catheters and wires were removed. Exoseal was deployed at the right common femoral artery for hemostasis. Patient tolerated the procedure well and remained hemodynamically stable throughout. No complications were encountered and no significant blood loss. FINDINGS: Ultrasound demonstrates patency the right common femoral artery. Angiogram of the celiac artery demonstrates typical anatomy, with the celiac artery contributing to splenic artery and common hepatic artery. Gastroduodenal artery arises from the common hepatic artery. Angiogram of the gastroduodenal artery demonstrates no tumor blush, extravasation, pseudoaneurysm. Duodenal branches originate from the mid segment of the descending gastroduodenal artery. Gastroepiploic artery is patent. Status post coil embolization there is no flow into the branches of the gastroduodenal artery, with slight filling at the stump and no collateral flow to the mid segment. IMPRESSION: Status post ultrasound guided access right common femoral artery for mesenteric angiogram and empiric embolization of gastroduodenal artery, as a target contributing to emergent, life-threatening upper GI hemorrhage. Status post Exoseal deployment for hemostasis. Signed, Yvone Neu. Reyne Dumas, RPVI [facility] Electronically Signed   By: Gilmer Mor D.O.   On: 10/22/2017 09:02      Scheduled Meds: . Dimethyl Fumarate  240 mg Per Tube BID  . feeding supplement (PRO-STAT SUGAR FREE 64)  30 mL Per Tube BID  . folic acid  1 mg Intravenous Daily  . free water  200 mL Per Tube Q8H  . pantoprazole (PROTONIX) IV  40 mg Intravenous Q12H  . QUEtiapine  25 mg Oral QHS  .  sodium chloride flush  3 mL Intravenous Q12H  . thiamine  100 mg Oral Daily   Or  . thiamine  100 mg Intravenous Daily   Continuous  Infusions: . dextrose 100 mL/hr at 10/23/17 1032  . feeding supplement (OSMOLITE 1.5 CAL)       LOS: 5 days    Time spent: 40 minutes   Noralee Stain, DO [facility] [email or web address] Password [email or web address] 10/23/2017, 12:48 PM

## 2017-10-24 DIAGNOSIS — G9340 Encephalopathy, unspecified: Secondary | ICD-10-CM

## 2017-10-24 DIAGNOSIS — Z515 Encounter for palliative care: Secondary | ICD-10-CM

## 2017-10-24 DIAGNOSIS — Z7189 Other specified counseling: Secondary | ICD-10-CM

## 2017-10-24 LAB — PREPARE RBC (CROSSMATCH)

## 2017-10-24 LAB — HEMOGLOBIN AND HEMATOCRIT, BLOOD
HCT: 23.5 % — ABNORMAL LOW (ref 39.0–52.0)
HCT: 29.8 % — ABNORMAL LOW (ref 39.0–52.0)
Hemoglobin: 7.3 g/dL — ABNORMAL LOW (ref 13.0–17.0)
Hemoglobin: 10 g/dL — ABNORMAL LOW (ref 13.0–17.0)

## 2017-10-24 LAB — BASIC METABOLIC PANEL
Anion gap: 5 (ref 5–15)
BUN: 21 mg/dL (ref 8–23)
CO2: 22 mmol/L (ref 22–32)
Calcium: 8 mg/dL — ABNORMAL LOW (ref 8.9–10.3)
Chloride: 119 mmol/L — ABNORMAL HIGH (ref 98–111)
Creatinine, Ser: 1.09 mg/dL (ref 0.61–1.24)
GFR calc Af Amer: >60 mL/min (ref >60)
GFR calc non Af Amer: >60 mL/min (ref >60)
Glucose, Bld: 110 mg/dL — ABNORMAL HIGH (ref 70–99)
Potassium: 3.3 mmol/L — ABNORMAL LOW (ref 3.5–5.1)
Sodium: 146 mmol/L — ABNORMAL HIGH (ref 135–145)

## 2017-10-24 LAB — CBC
HCT: 24.1 % — ABNORMAL LOW (ref 39.0–52.0)
Hemoglobin: 7.6 g/dL — ABNORMAL LOW (ref 13.0–17.0)
MCH: 31.5 pg (ref 26.0–34.0)
MCHC: 31.5 g/dL (ref 30.0–36.0)
MCV: 100 fL (ref 78.0–100.0)
Platelets: 289 10*3/uL (ref 150–400)
RBC: 2.41 MIL/uL — ABNORMAL LOW (ref 4.22–5.81)
RDW: 16.7 % — ABNORMAL HIGH (ref 11.5–15.5)
WBC: 5.5 10*3/uL (ref 4.0–10.5)

## 2017-10-24 LAB — GLUCOSE, CAPILLARY
Glucose-Capillary: 118 mg/dL — ABNORMAL HIGH (ref 70–99)
Glucose-Capillary: 109 mg/dL — ABNORMAL HIGH (ref 70–99)
Glucose-Capillary: 93 mg/dL (ref 70–99)
Glucose-Capillary: 100 mg/dL — ABNORMAL HIGH (ref 70–99)

## 2017-10-24 LAB — PHOSPHORUS: Phosphorus: 3.1 mg/dL (ref 2.5–4.6)

## 2017-10-24 LAB — MAGNESIUM: Magnesium: 2 mg/dL (ref 1.7–2.4)

## 2017-10-24 MED ORDER — ORAL CARE MOUTH RINSE
15.0000 mL | Freq: Two times a day (BID) | OROMUCOSAL | Status: DC
  Administered 2017-10-24 – 2017-11-11 (×20): 15 mL via OROMUCOSAL

## 2017-10-24 MED ORDER — POTASSIUM CHLORIDE 20 MEQ/15ML (10%) PO SOLN
40.0000 meq | Freq: Once | ORAL | Status: AC
  Administered 2017-10-24: 40 meq
  Filled 2017-10-24: qty 30

## 2017-10-24 MED ORDER — SODIUM CHLORIDE 0.9% IV SOLUTION
Freq: Once | INTRAVENOUS | Status: AC
  Administered 2017-10-24: 12:00:00 via INTRAVENOUS

## 2017-10-24 MED ORDER — SODIUM CHLORIDE 0.45 % IV BOLUS: 1000.0000 mL | Freq: Once | INTRAVENOUS | Status: DC

## 2017-10-24 MED ORDER — SODIUM CHLORIDE 0.9 % IV BOLUS
500.0000 mL | Freq: Once | INTRAVENOUS | Status: AC
  Administered 2017-10-24: 500 mL via INTRAVENOUS

## 2017-10-24 NOTE — Progress Notes (Addendum)
PROGRESS NOTE    [name]  BJY:[id number] DOB: 23-Aug-1955 DOA: 10/16/2017 PCP: Rinaldo Cloud, MD     Brief Narrative:  Alexander Quails McGeeis a 62 y.o.malewith medical history significant ofMS (follows with Dr. Rogene Houston disorder, alcohol and tobacco abuse, presented to the emergency department complaining of abdominal pain with nausea and vomiting. Patient is poor historian. He reports to 2-3 days of abdominal pain associated with nausea and vomiting. Patient reported epigastric pain started approximately 2 weeks ago which has been worse with eating. Denies diarrhea, constipation and fever or chills. Last alcohol use 3 days ago. Normally drinks occasionally but can drink up to 12 pack a day. Other associated symptoms include weakness/faint.Denies NSAID/ASA use, no history of GI bleeding in the past. Patient denies dark stools.  Patient underwent EGD which revealed large, deep duodenal bulb ulcer.  Patient underwent empiric embolization by IR on 7/1.  Hospitalization further complicated by altered mental status.  New events last 24 hours / Subjective: Patient remains with altered mental status, although improved from yesterday.  Today, he is able to mumble that he needs to use the bathroom.  He is able to tell me that he is in [place].  He has had low blood pressures overnight, SBP in the 80s to 90s.  No sign of acute bleeding per nursing report.  Assessment & Plan:   Principal Problem:   Symptomatic anemia Active Problems:   MS (multiple sclerosis) (HCC)   Bipolar affective disorder, depressed, severe (HCC)   Bipolar I disorder (HCC)   Upper GI bleeding   GI bleed   Symptomatic anemia/ Upper GI bleed -Presented with weakness, palpitations and soft blood pressure -Chronic use of alcohol; denies use of NSAIDs -Hemoglobin approximately 14-15, on admission was 10.1 with positive FOBT -Was on Protonix drip for 48 hours, and transitioned to IV BID -Anemia panel showed an  iron of 77, ferritin of 122, vitamin B12 180, folate 11.4 -Given that vitamin B12 is on the low normal side, dose of B12 given -Gastroenterology consulted and appreciated, status post EGD 6/27: Showed very large, deep and created duodenal bulb/duodenal sweep ulcer with pigmented spots.  LA grade D esophagitis -S/p empiric embolization of GDA on 10/21/2017 by IR  -No further bleeding episodes noted, although blood pressure has been low today, hemoglobin 7.6 from 9.4 yesterday.  Repeat H&H today, type and screen. -I spoke with Dr. Evette Cristal with [facility] this morning. Will transfuse 2u pRBC for now, continue to monitor for signs of bleeding.   Acute metabolic encephalopathy -Unclear etiology, and chart review, it appears that patient started to decline around 7/1.  Previously, patient was alert and oriented x3.  There was concern for alcohol withdrawal contributing to his encephalopathy.  I discussed with his ex-wife, at baseline, patient is alert and oriented, independent of daily activities although has been declining. -Slightly improved today, although remains altered  Hypernatremia -Poor oral intake due to mentation.  NG tube ordered, free water flushes.  Sodium improved  Acute kidney injury  -Baseline creatinine approximately 0.9-1 -Resolved  Multiple sclerosis -Neurology, Dr. Epimenio Foot -Stable, continue tecfidera - on hold due to patient not alert enough to take in PO meds   Bipolar disorder -Continue Abilify, Wellbutrin, Lexapro, lithium. Ex-wife reports patient has not been on lithium in over a month, unsure about other 3 medications if he has been taking it on a regular basis.  Patient's last dose of these medications were 6/30.  I will discontinue these for now until mentation improves  Alcohol abuse with acute delirium -Last alcoholic drink was over a week ago.  Doubt acute withdrawal at this point.  Hypokalemia -Replace, Mg normal, trend BMP    DVT prophylaxis: SCD Code Status:  Full Family Communication: No family at bedside today  Disposition Plan: Pending improvement in mentation and stabilization in GIB   Consultants:   GI  IR  Procedures:   EGD  Empiric embolization of GDA   Antimicrobials:  Anti-infectives (From admission, onward)   None       Objective: Vitals:   10/24/17 0800 10/24/17 0900 10/24/17 0926 10/24/17 0930  BP: (!) 80/41 (!) 87/43 (!) 72/38 (!) 86/71  Pulse:      Resp: (!) 24 (!) 23 18 18   Temp:      TempSrc:      SpO2: 96% 94%  94%  Weight:      Height:        Intake/Output Summary (Last 24 hours) at 10/24/2017 1004 Last data filed at 10/24/2017 0916 Gross per 24 hour  Intake 3 ml  Output 500 ml  Net -497 ml   Filed Weights   10/17/17 0859 10/23/17 1051 10/24/17 0200  Weight: 80.1 kg (176 lb 9.4 oz) 70.4 kg (155 lb 3.3 oz) 70.9 kg (156 lb 4.9 oz)    Examination: General exam: Appears calm, although has episodes of agitation throughout exam  Respiratory system: Clear to auscultation. Respiratory effort normal. Cardiovascular system: S1 & S2 heard, RRR. No JVD, murmurs, rubs, gallops or clicks. No pedal edema. Gastrointestinal system: Abdomen is nondistended, soft and nontender. No organomegaly or masses felt. Normal bowel sounds heard. Central nervous system: Alert and oriented to self and place only, does not answer other questions appropriately  Extremities: Symmetric 5 x 5 power. Skin: No rashes, lesions or ulcers   Data Reviewed: I have personally reviewed following labs and imaging studies  CBC: Recent Labs  Lab 10/20/17 0431  10/21/17 0456 10/21/17 1920 10/22/17 0307 10/22/17 1153 10/24/17 0324  WBC 4.9  --  5.5  --  7.8 8.3 5.5  HGB 8.2*   < > 6.8* 9.0* 9.4* 9.4* 7.6*  HCT 24.6*   < > 21.0* 26.8* 29.5* 28.3* 24.1*  MCV 97.2  --  98.6  --  97.0 96.9 100.0  PLT 269  --  314  --  311 334 289   < > = values in this interval not displayed.   Basic Metabolic Panel: Recent Labs  Lab 10/20/17 0431  10/21/17 0456 10/22/17 0307 10/23/17 0327 10/24/17 0324  NA 143 144 147* 150* 146*  K 4.2 4.1 4.2 4.0 3.3*  CL 116* 117* 119* 123* 119*  CO2 22 21* 19* 22 22  GLUCOSE 92 108* 96 129* 110*  BUN 26* 44* 34* 28* 21  CREATININE 1.09 1.42* 1.32* 1.35* 1.09  CALCIUM 8.0* 8.0* 8.2* 8.3* 8.0*  MG  --   --   --   --  2.0  PHOS  --   --   --   --  3.1   GFR: Estimated Creatinine Clearance: 70.5 mL/min (by C-G formula based on SCr of 1.09 mg/dL). Liver Function Tests: Recent Labs  Lab 10/18/17 0319  AST 19  ALT 12  ALKPHOS 37*  BILITOT 0.5  PROT 4.7*  ALBUMIN 2.5*   No results for input(s): LIPASE, AMYLASE in the last 168 hours. No results for input(s): AMMONIA in the last 168 hours. Coagulation Profile: No results for input(s): INR, PROTIME in the last 168  hours. Cardiac Enzymes: No results for input(s): CKTOTAL, CKMB, CKMBINDEX, TROPONINI in the last 168 hours. BNP (last 3 results) No results for input(s): PROBNP in the last 8760 hours. HbA1C: No results for input(s): HGBA1C in the last 72 hours. CBG: Recent Labs  Lab 10/24/17 0813  GLUCAP 118*   Lipid Profile: No results for input(s): CHOL, HDL, LDLCALC, TRIG, CHOLHDL, LDLDIRECT in the last 72 hours. Thyroid Function Tests: No results for input(s): TSH, T4TOTAL, FREET4, T3FREE, THYROIDAB in the last 72 hours. Anemia Panel: No results for input(s): VITAMINB12, FOLATE, FERRITIN, TIBC, IRON, RETICCTPCT in the last 72 hours. Sepsis Labs: No results for input(s): PROCALCITON, LATICACIDVEN in the last 168 hours.  Recent Results (from the past 240 hour(s))  MRSA PCR Screening     Status: None   Collection Time: 10/16/17  3:21 PM  Result Value Ref Range Status   MRSA by PCR NEGATIVE NEGATIVE Final    Comment:        The GeneXpert MRSA Assay (FDA approved for NASAL specimens only), is one component of a comprehensive MRSA colonization surveillance program. It is not intended to diagnose MRSA infection nor to guide  or monitor treatment for MRSA infections. Performed at [facility], [address] [place], [place] [address]        Radiology Studies: Dg Abd 1 View  Result Date: 10/23/2017 CLINICAL DATA:  NG tube placement. EXAM: ABDOMEN - 1 VIEW COMPARISON:  10/23/2017 FINDINGS: An NG tube is identified with tip overlying the gastric fundus. Nondistended gas-filled small bowel loops are noted, decreased. No other significant change IMPRESSION: NG tube with tip overlying gastric fundus. Electronically Signed   By: Harmon Pier M.D.   On: 10/23/2017 20:29   Dg Abd 1 View  Result Date: 10/23/2017 CLINICAL DATA:  Nasogastric tube placement EXAM: ABDOMEN - 1 VIEW COMPARISON:  Portable exam 1707 hours compared to 12/09/2006 FINDINGS: Tip of nasogastric tube projects over proximal stomach. Dilated air-filled loops of small bowel in the upper and mid abdomen. Small amount of stool and gas within colon. Multiple coils are present in the mid abdomen projecting over the upper lumbar spine from prior embolization procedure. Bones demineralized. IMPRESSION: Nasogastric tube projects over the proximal stomach. Air-filled small bowel loops throughout upper abdomen with gas and stool in colon, question ileus. Electronically Signed   By: Ulyses Southward M.D.   On: 10/23/2017 17:31      Scheduled Meds: . Dimethyl Fumarate  240 mg Oral BID  . feeding supplement (PRO-STAT SUGAR FREE 64)  30 mL Per Tube BID  . folic acid  1 mg Intravenous Daily  . free water  200 mL Per Tube Q8H  . pantoprazole (PROTONIX) IV  40 mg Intravenous Q12H  . QUEtiapine  25 mg Oral QHS  . sodium chloride flush  3 mL Intravenous Q12H  . thiamine  100 mg Oral Daily   Or  . thiamine  100 mg Intravenous Daily   Continuous Infusions: . dextrose 100 mL/hr at 10/24/17 0728  . feeding supplement (OSMOLITE 1.5 CAL) 1,000 mL (10/23/17 2134)     LOS: 6 days    Time spent: 25 minutes   Noralee Stain, DO [facility] [email or web address] Password [email or web address] 10/24/2017, 10:04 AM

## 2017-10-24 NOTE — Progress Notes (Signed)
[facility] Progress Note  Subjective: The patient is more alert today than when I previously saw him last weekend.  He has no complaints of abdominal pain, vomiting, or passing blood in his stool at this time.  He had an EGD previously which revealed a large deep duodenal bulb ulcer which was not amenable to endoscopic therapy and he underwent embolization by IR on July 1.  He has not exhibited any signs of active GI bleeding overnight but did have a drop in blood pressure and drop in hemoglobin and hematocrit.  Objective: Vital signs in last 24 hours: Temp:  [97.7 F (36.5 C)-100.5 F (38.1 C)] 97.7 F (36.5 C) (07/04 0659) Pulse Rate:  [107] 107 (07/04 0347) Resp:  [18-30] 23 (07/04 1100) BP: (72-170)/(34-71) 106/51 (07/04 1100) SpO2:  [91 %-100 %] 99 % (07/04 1045) Weight:  [70.9 kg (156 lb 4.9 oz)] 70.9 kg (156 lb 4.9 oz) (07/04 0200) Weight change:    PE:  No distress  Heart regular rhythm  Abdomen soft nontender  Lab Results: Results for orders placed or performed during the hospital encounter of 10/16/17 (from the past 24 hour(s))  CBC     Status: Abnormal   Collection Time: 10/24/17  3:24 AM  Result Value Ref Range   WBC 5.5 4.0 - 10.5 K/uL   RBC 2.41 (L) 4.22 - 5.81 MIL/uL   Hemoglobin 7.6 (L) 13.0 - 17.0 g/dL   HCT 33.2 (L) 95.1 - 88.4 %   MCV 100.0 78.0 - 100.0 fL   MCH 31.5 26.0 - 34.0 pg   MCHC 31.5 30.0 - 36.0 g/dL   RDW 16.6 (H) 06.3 - 01.6 %   Platelets 289 150 - 400 K/uL  Basic metabolic panel     Status: Abnormal   Collection Time: 10/24/17  3:24 AM  Result Value Ref Range   Sodium 146 (H) 135 - 145 mmol/L   Potassium 3.3 (L) 3.5 - 5.1 mmol/L   Chloride 119 (H) 98 - 111 mmol/L   CO2 22 22 - 32 mmol/L   Glucose, Bld 110 (H) 70 - 99 mg/dL   BUN 21 8 - 23 mg/dL   Creatinine, Ser 0.10 0.61 - 1.24 mg/dL   Calcium 8.0 (L) 8.9 - 10.3 mg/dL   GFR calc non Af Amer >60 >60 mL/min   GFR calc Af Amer >60 >60 mL/min   Anion gap 5 5 - 15   Magnesium     Status: None   Collection Time: 10/24/17  3:24 AM  Result Value Ref Range   Magnesium 2.0 1.7 - 2.4 mg/dL  Phosphorus     Status: None   Collection Time: 10/24/17  3:24 AM  Result Value Ref Range   Phosphorus 3.1 2.5 - 4.6 mg/dL  Glucose, capillary     Status: Abnormal   Collection Time: 10/24/17  8:13 AM  Result Value Ref Range   Glucose-Capillary 118 (H) 70 - 99 mg/dL   Comment 1 Notify RN    Comment 2 Document in Chart   Hemoglobin and hematocrit, blood     Status: Abnormal   Collection Time: 10/24/17  9:59 AM  Result Value Ref Range   Hemoglobin 7.3 (L) 13.0 - 17.0 g/dL   HCT 93.2 (L) 35.5 - 73.2 %  Prepare RBC     Status: None   Collection Time: 10/24/17 10:56 AM  Result Value Ref Range   Order Confirmation      ORDER PROCESSED BY BLOOD BANK Performed at [facility], [address] [place], [place] [address]     Studies/Results: Dg Abd 1 View  Result Date: 10/23/2017 CLINICAL DATA:  NG tube placement. EXAM: ABDOMEN - 1 VIEW COMPARISON:  10/23/2017 FINDINGS: An NG tube is identified with tip overlying the gastric fundus. Nondistended gas-filled small bowel loops are noted, decreased. No other significant change IMPRESSION: NG tube with tip overlying gastric fundus. Electronically Signed   By: Harmon Pier M.D.   On: 10/23/2017 20:29   Dg Abd 1 View  Result Date: 10/23/2017 CLINICAL DATA:  Nasogastric tube placement EXAM: ABDOMEN - 1 VIEW COMPARISON:  Portable exam 1707 hours compared to 12/09/2006 FINDINGS: Tip of nasogastric tube projects over proximal stomach. Dilated air-filled loops of small bowel in the upper and mid abdomen. Small amount of stool and gas within colon. Multiple coils are present in the mid abdomen projecting over the upper lumbar spine from prior embolization procedure. Bones demineralized. IMPRESSION: Nasogastric tube projects over the proximal stomach. Air-filled small bowel loops throughout upper abdomen with gas and  stool in colon, question ileus. Electronically Signed   By: Ulyses Southward M.D.   On: 10/23/2017 17:31      Assessment: GI bleed secondary to duodenal ulcer.  Status post embolization of gastroduodenal artery.  Drop in hemoglobin overnight.  No overt signs however of active GI bleeding at this time.  Plan:   Transfuse 2 units of packed red cells today.  Monitor for further bleeding.  PPI therapy.  If signs of recurrent bleeding occur which are significant we could proceed with repeat EGD.  Would hold tube feedings for now.    Gwenevere Abbot 10/24/2017, 11:26 AM  Pager: [phone] If no answer or after 5 PM call [phone] Lab Results  Component Value Date   HGB 7.3 (L) 10/24/2017   HGB 7.6 (L) 10/24/2017   HGB 9.4 (L) 10/22/2017   HGB 15.0 04/08/2017   HGB 14.8 04/05/2016   HGB 15.2 01/16/2016   HCT 23.5 (L) 10/24/2017   HCT 24.1 (L) 10/24/2017   HCT 28.3 (L) 10/22/2017   HCT 44.6 04/08/2017   HCT 44.2 04/05/2016   HCT 44.9 01/16/2016   ALKPHOS 37 (L) 10/18/2017   ALKPHOS 50 10/16/2017   ALKPHOS 54 09/23/2016   AST 19 10/18/2017   AST 19 10/16/2017   AST 19 09/23/2016   ALT 12 10/18/2017   ALT 12 10/16/2017   ALT 14 (L) 09/23/2016   AMYLASE 78 10/28/2011

## 2017-10-24 NOTE — Consult Note (Signed)
Consultation Note Date: 10/24/2017   Patient Name: [name]  DOB: 07/09/1955  MRN: [id number]  Age / Sex: 62 y.o., male  PCP: Charolette Forward, MD Referring Physician: Dessa Phi, DO  Reason for Consultation: Establishing goals of care  HPI/Patient Profile: 62 y.o. male  with past medical history of multiple sclerosis, bipolar disorder, alcohol and tobacco abuse admitted on 10/16/2017 with abdominal pain, bloody N/V, epigastric pain. He has continued admission with altered mental status and recurrent anemia.   Clinical Assessment and Goals of Care: I have reviewed records and notes for [name]. He is alert and focuses and tracks. He answers my questions but mumbles and difficult to understand at times. He does better with simple questions. He does not remember he is in the hospital or why he came but then tells me "I'm feeling better." He is confused and believes that he dropped his cigarette in the bed. He asks for ice cream. He gives me permission to call his ex-wife [name] (listed as his primary contact) along with his roommate. Supposedly has an estranged son of 53 yo.   I called and spoke with ex-wife, [name]. [name] shares that [name] does not really have any family except some nephews in other states that he has never met or hasn't seen in years. He has a sister in [place] he hasn't been in contact with in many years. They have an 39 yo son together but they do not have much of a relationship either. [name] states that she is good friends with Randal's roommate, Remo Lipps, whom she has known longer than she has known Ancel. She does try to keep herself somewhat distanced from [place] to protect herself. She and Remo Lipps have noticed increased forgetfulness and has not been safe to use stove/oven to cook as well as increasing falls. He has also misplaced some of his medications (Lithium) and they are unaware as was Nyzir  of what happened to his medicine. He has recently began to wear sunglasses around inside the home which is making them question his vision. She also reports history of suicidal ideation and attempts (attempts have been immediately followed by him calling 911 or hotline) with overdose and he threatened to jump off a bridge one time. She says he has had multiple admissions to mental health facilities but has been ~1-1.5 yrs since last suicide attempt. She also reports that he did not tolerate Abilify very well "it made him act crazy."   From speaking with [name] it seems that [name] is more alert for me today than previously. She is hopeful that he will improve once back on his medications. They are still hopeful for improvement overall. Ideally [name] would make his own decisions. His son is very young and does not really have much of a relationship with [name]. Best surrogate decision makers would likely be son together with ex-wife/his mother if necessary. Will follow.   Primary Decision Maker Son + ex-wife (son's mother)    SUMMARY OF RECOMMENDATIONS   - Monitor for improvement  vs decline - [name] questioning the status of MS and role in his recent decline (he has missed last 2 appointments with neurology) - Will continue to follow  Code Status/Advance Care Planning:  Full code   Symptom Management:   Per primary. Avoid sedating medications.   Palliative Prophylaxis:   Aspiration, Delirium Protocol, Oral Care and Turn Reposition  Psycho-social/Spiritual:   Desire for further Chaplaincy support:no  Additional Recommendations: Caregiving  Support/Resources  Prognosis:   Unable to determine  Discharge Planning: To Be Determined      Primary Diagnoses: Present on Admission: . GI bleed . Bipolar affective disorder, depressed, severe ([employer]) . Bipolar I disorder ([place])   I have reviewed the medical record, interviewed the patient and family, and examined the patient. The following  aspects are pertinent.  Past Medical History:  Diagnosis Date  . Depression   . Mental disorder   . Movement disorder   . MS (multiple sclerosis) ([place])   . Multiple sclerosis, relapsing-remitting ([place])   . Neuromuscular disorder ([place])   . Vision abnormalities    Social History   Socioeconomic History  . Marital status: Legally Separated    Spouse name: Not on file  . Number of children: Not on file  . Years of education: Not on file  . Highest education level: Not on file  Occupational History  . Not on file  Social Needs  . Financial resource strain: Not on file  . Food insecurity:    Worry: Not on file    Inability: Not on file  . Transportation needs:    Medical: Not on file    Non-medical: Not on file  Tobacco Use  . Smoking status: Current Every Day Smoker    Packs/day: 3.00    Years: 36.00    Pack years: 108.00    Types: Cigarettes  . Smokeless tobacco: Never Used  Substance and Sexual Activity  . Alcohol use: Yes    Alcohol/week: 0.0 oz    Comment: 2-3 times a week.   . Drug use: Yes    Types: Marijuana    Comment: 1-2 times a month  . Sexual activity: Not Currently    Birth control/protection: None  Lifestyle  . Physical activity:    Days per week: Not on file    Minutes per session: Not on file  . Stress: Not on file  Relationships  . Social connections:    Talks on phone: Not on file    Gets together: Not on file    Attends religious service: Not on file    Active member of club or organization: Not on file    Attends meetings of clubs or organizations: Not on file    Relationship status: Not on file  Other Topics Concern  . Not on file  Social History Narrative   ** Merged History Encounter **       Family History  Problem Relation Age of Onset  . Heart disease Mother   . Stroke Father    Scheduled Meds: . Dimethyl Fumarate  240 mg Oral BID  . folic acid  1 mg Intravenous Daily  . free water  200 mL Per Tube Q8H  . mouth rinse  15  mL Mouth Rinse BID  . pantoprazole (PROTONIX) IV  40 mg Intravenous Q12H  . QUEtiapine  25 mg Oral QHS  . sodium chloride flush  3 mL Intravenous Q12H  . thiamine  100 mg Oral Daily   Or  . thiamine  100 mg Intravenous Daily   Continuous Infusions: . dextrose Stopped (10/24/17 1217)   PRN Meds:.acetaminophen **OR** acetaminophen, haloperidol lactate, ondansetron **OR** ondansetron (ZOFRAN) IV, senna-docusate No Known Allergies Review of Systems  Unable to perform ROS: Acuity of condition  Gastrointestinal: Negative for abdominal pain and nausea.    Physical Exam  Constitutional: He has a sickly appearance.  Cardiovascular: Normal rate.  Pulmonary/Chest: Effort normal. No accessory muscle usage. No tachypnea. No respiratory distress.  Abdominal: Soft. Normal appearance.  Neurological: He is alert. He is disoriented.    Vital Signs: BP (!) 106/55   Pulse 77   Temp 98.5 F (36.9 C) (Oral)   Resp 16   Ht _0  (1.803 m)   Wt 70.9 kg (156 lb 4.9 oz)   SpO2 99%   BMI 21.80 kg/m  Pain Scale: PAINAD   Pain Score: 0-No pain   SpO2: SpO2: 99 % O2 Device:SpO2: 99 % O2 Flow Rate: .O2 Flow Rate (L/min): 2 L/min  IO: Intake/output summary:   Intake/Output Summary (Last 24 hours) at 10/24/2017 1245 Last data filed at 10/24/2017 1217 Gross per 24 hour  Intake 2886.67 ml  Output 500 ml  Net 2386.67 ml    LBM: Last BM Date: 10/16/17 Baseline Weight: Weight: 80.1 kg (176 lb 9.4 oz) Most recent weight: Weight: 70.9 kg (156 lb 4.9 oz)     Palliative Assessment/Data: 30%     Time Total: 50 min  Greater than 50%  of this time was spent counseling and coordinating care related to the above assessment and plan.  Signed by: Vinie Sill, NP Palliative Medicine Team Pager # [phone] (M-F 8a-5p) Team Phone # [phone] (Nights/Weekends)

## 2017-10-24 NOTE — Progress Notes (Signed)
PT PT Cancellation Note  Patient Details Name: [name] MRN: [id number] DOB: 1956/03/15   Cancelled Treatment:     PT deferred this date at request of RN - pt with low BP and continues to be uncooperative.  Will follow.   [name] Sovine 10/24/2017, 10:12 AM

## 2017-10-24 NOTE — Progress Notes (Signed)
MD Dr. Alvino Chapel paged to make aware of pts BP. New orders obtained will continue to monitor.

## 2017-10-24 NOTE — Progress Notes (Signed)
Patient pulled out NG tube. Reinsertion attempted, attempt unsuccessful, patient agitated/not compliant. . MD made aware. New orders received to hold off on reinsertion at this time. Will continue to monitor.

## 2017-10-24 NOTE — Progress Notes (Signed)
Bolus ordered by MD discontinued due to BP increasing by MD. Awaiting lab results. Will continue to monitor closely.

## 2017-10-24 NOTE — Progress Notes (Signed)
OT Cancellation Note  Patient Details Name: [name] MRN: [id number] DOB: 1956/02/01   Cancelled Treatment:    Reason Eval/Treat Not Completed: Other (comment)  Pt with low BP and uncooperative. Will check back tomorrow.  Hortence Charter 10/24/2017, 10:30 AM  Marica Otter, OTR/L [phone] 10/24/2017

## 2017-10-24 NOTE — Plan of Care (Signed)
  Problem: Clinical Measurements: Goal: Will remain free from infection Outcome: Progressing Goal: Respiratory complications will improve Outcome: Progressing Goal: Cardiovascular complication will be avoided Outcome: Progressing   Problem: Nutrition: Goal: Adequate nutrition will be maintained Outcome: Progressing   Problem: Elimination: Goal: Will not experience complications related to urinary retention Outcome: Progressing   Problem: Skin Integrity: Goal: Risk for impaired skin integrity will decrease Outcome: Progressing

## 2017-10-25 DIAGNOSIS — Z515 Encounter for palliative care: Secondary | ICD-10-CM

## 2017-10-25 DIAGNOSIS — Z7189 Other specified counseling: Secondary | ICD-10-CM

## 2017-10-25 DIAGNOSIS — G9340 Encephalopathy, unspecified: Secondary | ICD-10-CM

## 2017-10-25 LAB — TYPE AND SCREEN
ABO/RH(D): A POS
Antibody Screen: NEGATIVE
Unit division: 0
Unit division: 0
Unit division: 0
Unit division: 0

## 2017-10-25 LAB — BPAM RBC
Blood Product Expiration Date: 201907232359
Blood Product Expiration Date: 201907262359
Blood Product Expiration Date: 201907272359
Blood Product Expiration Date: 201907272359
ISSUE DATE / TIME: 201907010836
ISSUE DATE / TIME: 201907011109
ISSUE DATE / TIME: 201907041217
ISSUE DATE / TIME: 201907041453
Unit Type and Rh: 6200
Unit Type and Rh: 6200
Unit Type and Rh: 6200
Unit Type and Rh: 6200

## 2017-10-25 LAB — GLUCOSE, CAPILLARY
Glucose-Capillary: 81 mg/dL (ref 70–99)
Glucose-Capillary: 86 mg/dL (ref 70–99)
Glucose-Capillary: 87 mg/dL (ref 70–99)
Glucose-Capillary: 87 mg/dL (ref 70–99)
Glucose-Capillary: 93 mg/dL (ref 70–99)
Glucose-Capillary: 95 mg/dL (ref 70–99)

## 2017-10-25 LAB — CBC
HCT: 30.9 % — ABNORMAL LOW (ref 39.0–52.0)
Hemoglobin: 10.3 g/dL — ABNORMAL LOW (ref 13.0–17.0)
MCH: 31.5 pg (ref 26.0–34.0)
MCHC: 33.3 g/dL (ref 30.0–36.0)
MCV: 94.5 fL (ref 78.0–100.0)
Platelets: 281 10*3/uL (ref 150–400)
RBC: 3.27 MIL/uL — ABNORMAL LOW (ref 4.22–5.81)
RDW: 16.8 % — ABNORMAL HIGH (ref 11.5–15.5)
WBC: 4.6 10*3/uL (ref 4.0–10.5)

## 2017-10-25 LAB — BASIC METABOLIC PANEL
Anion gap: 3 — ABNORMAL LOW (ref 5–15)
BUN: 16 mg/dL (ref 8–23)
CO2: 22 mmol/L (ref 22–32)
Calcium: 8 mg/dL — ABNORMAL LOW (ref 8.9–10.3)
Chloride: 116 mmol/L — ABNORMAL HIGH (ref 98–111)
Creatinine, Ser: 0.97 mg/dL (ref 0.61–1.24)
GFR calc Af Amer: >60 mL/min (ref >60)
GFR calc non Af Amer: >60 mL/min (ref >60)
Glucose, Bld: 101 mg/dL — ABNORMAL HIGH (ref 70–99)
Potassium: 3.8 mmol/L (ref 3.5–5.1)
Sodium: 141 mmol/L (ref 135–145)

## 2017-10-25 LAB — MAGNESIUM: Magnesium: 2.1 mg/dL (ref 1.7–2.4)

## 2017-10-25 LAB — PHOSPHORUS: Phosphorus: 3.2 mg/dL (ref 2.5–4.6)

## 2017-10-25 MED ORDER — TRAZODONE HCL 50 MG PO TABS
25.0000 mg | ORAL_TABLET | Freq: Every evening | ORAL | Status: DC | PRN
Start: 2017-10-25 — End: 2017-11-11
  Administered 2017-10-25 – 2017-11-09 (×8): 25 mg via ORAL
  Filled 2017-10-25 (×8): qty 1

## 2017-10-25 MED ORDER — LORAZEPAM 2 MG/ML IJ SOLN
1.0000 mg | INTRAMUSCULAR | Status: DC | PRN
  Administered 2017-10-25 – 2017-10-28 (×5): 1 mg via INTRAVENOUS
  Filled 2017-10-25 (×6): qty 1

## 2017-10-25 MED ORDER — FOLIC ACID 1 MG PO TABS
1.0000 mg | ORAL_TABLET | Freq: Every day | ORAL | Status: DC
  Administered 2017-10-25 – 2017-11-11 (×13): 1 mg via ORAL
  Filled 2017-10-25 (×16): qty 1

## 2017-10-25 MED ORDER — POLYETHYLENE GLYCOL 3350 17 G PO PACK
17.0000 g | PACK | Freq: Every day | ORAL | Status: DC
  Administered 2017-10-30 – 2017-11-11 (×8): 17 g via ORAL
  Filled 2017-10-25 (×10): qty 1

## 2017-10-25 MED ORDER — ADULT MULTIVITAMIN W/MINERALS CH
1.0000 | ORAL_TABLET | Freq: Every day | ORAL | Status: DC
  Administered 2017-10-25 – 2017-11-11 (×13): 1 via ORAL
  Filled 2017-10-25 (×16): qty 1

## 2017-10-25 MED ORDER — SENNOSIDES-DOCUSATE SODIUM 8.6-50 MG PO TABS
1.0000 | ORAL_TABLET | Freq: Every day | ORAL | Status: DC
  Administered 2017-10-28 – 2017-11-10 (×13): 1 via ORAL
  Filled 2017-10-25 (×15): qty 1

## 2017-10-25 NOTE — Progress Notes (Signed)
PT Cancellation Note  Patient Details Name: [name] MRN: [id number] DOB: 04/10/1956   Cancelled Treatment:    Reason Eval/Treat Not Completed: Pt recently medicated with ativan. Will check back another day/time.    Rebeca Alert, MPT Pager: [phone]

## 2017-10-25 NOTE — Progress Notes (Signed)
Nutrition Follow-up  DOCUMENTATION CODES:   Not applicable  INTERVENTION:  - Diet advancement per SLP recommendation, if feasible. - If diet unable to be advanced, recommend NGT replacement and initiate 30 mL Prostat BID and Osmolite 1.5 @ 30 mL/hr to advance by 10 mL every 8 hours to reach goal rate of Osmolite 1.5 @ 50 mL/hr.  - Recommend scheduled bowel regimen as pt has not had a BM since admission (6/26).  NUTRITION DIAGNOSIS:   Inadequate oral intake related to lethargy/confusion, acute illness(alcohol withdrawal) as evidenced by meal completion < 25%. -ongoing  GOAL:   Patient will meet greater than or equal to 90% of their needs -unmet/unable to meet at this time.   MONITOR:   Diet advancement, Weight trends, Labs, Other (Comment)(need for NGT replacement and TF re-start)  ASSESSMENT:   62 y.o. male with medical history significant of MS, bipolar disorder, alcohol and tobacco abuse, presented to the emergency department complaining of abdominal pain with nausea and vomiting.  Patient is poor historian.  He reports to 2-3 days of abdominal pain associated with bloody nausea and vomiting.  Patient reported epigastric pain started approximately 2 weeks ago which has been worse with eating.   NGT placed 7/3 and patient was ordered 30 mL Prostat BID and Osmolite 1.5 @ 20 mL/hr to advance by 10 mL every 8 hours to goal rate of 50 mL/hr. Patient pulled NGT yesterday evening; at that time, per flow sheet, Osmolite 1.5 was running at 30 mL/hr.   Per Dr. Teresa Coombs note this AM, plan for SLP evaluation today. Will monitor for outcome. Patient is a re-feeding risk given very poor nutrition intake since admission (6/26) and hx of alcohol abuse with current likely withdrawal. Noted that patient has not had a BM since date of admission.   Medications reviewed; 1 mg oral folic acid/day, 40 mg IV Protonix BID, 100 mg oral thiamine/day, 1 tablet Senokot PRN/day. Labs reviewed; CBGs: 95, 93, and 87  mg/dL today, Ca: 8 mg/dL.      Diet Order:   Diet Order           Diet NPO time specified Except for: Sips with Meds  Diet effective now          EDUCATION NEEDS:   No education needs have been identified at this time  Skin:  Skin Assessment: Skin Integrity Issues: Skin Integrity Issues:: Incisions Incisions: R groin (7/1)  Last BM:  6/26  Height:   Ht Readings from Last 1 Encounters:  10/17/17 5\' 11"  (1.803 m)    Weight:   Wt Readings from Last 1 Encounters:  10/25/17 157 lb 13.6 oz (71.6 kg)    Ideal Body Weight:  78.18 kg  BMI:  Body mass index is 22.02 kg/m.  Estimated Nutritional Needs:   Kcal:  2000-2240 (25-28 kcal/kg)  Protein:  96-112 grams (1.2-1.4 grams/kg)  Fluid:  >/= 2 L/day     Trenton Gammon, MS, RD, LDN, [facility] Inpatient Clinical Dietitian Pager # [phone] After hours/weekend pager # [phone]

## 2017-10-25 NOTE — Progress Notes (Signed)
PROGRESS NOTE    [name]  TYO:[id number] DOB: November 24, 1955 DOA: 10/16/2017 PCP: Alexander Cloud, MD     Brief Narrative:  Alexander Quails McGeeis a 62 y.o.malewith medical history significant ofMS (follows with Dr. Rogene [place] disorder, alcohol and tobacco abuse, presented to the emergency department complaining of abdominal pain with nausea and vomiting. Patient is poor historian. He reports to 2-3 days of abdominal pain associated with nausea and vomiting. Patient reported epigastric pain started approximately 2 weeks ago which has been worse with eating. Denies diarrhea, constipation and fever or chills. Last alcohol use 3 days ago. Normally drinks occasionally but can drink up to 12 pack a day. Other associated symptoms include weakness/faint.Denies NSAID/ASA use, no history of GI bleeding in the past. Patient denies dark stools.  Patient underwent EGD which revealed large, deep duodenal bulb ulcer.  Patient underwent empiric embolization by IR on 7/1.  Hospitalization further complicated by altered mental status.  New events last 24 hours / Subjective: Continues to have episodes of delirium. He is actually much more alert and conversant today. Asking if he can go to the bathroom, although he has a condom catheter in place. He is able to tell me that he is in [place], in a hospital called [facility]. He is not oriented to year. He is fidgeting with his safety mittens. No report of melena overnight. He did accidentally pull out his NGT yesterday evening.   Assessment & Plan:   Principal Problem:   Symptomatic anemia Active Problems:   MS (multiple sclerosis) ([facility])   Bipolar affective disorder, depressed, severe ([facility])   Bipolar I disorder ([facility])   Upper GI bleeding   GI bleed   Symptomatic anemia/ Upper GI bleed -Presented with weakness, palpitations and soft blood pressure -Chronic use of alcohol; denies use of NSAIDs -Hemoglobin approximately 14-15, on admission was  10.1 with positive FOBT -Was on Protonix drip for 48 hours, and transitioned to IV BID -Anemia panel showed an iron of 77, ferritin of 122, vitamin B12 180, folate 11.4 -Given that vitamin B12 is on the low normal side, dose of B12 given -Gastroenterology consulted and appreciated, status post EGD 6/27: Showed very large, deep and created duodenal bulb/duodenal sweep ulcer with pigmented spots.  LA grade D esophagitis -S/p empiric embolization of GDA on 10/21/2017 by IR  -No further bleeding episodes noted. He had low BP and low Hgb on 7/4. He was given 2u pRBC with improvement.   Acute metabolic encephalopathy -Unclear etiology, and chart review, it appears that patient started to decline around 7/1.  Previously, patient was alert and oriented x3.  There was concern for alcohol withdrawal contributing to his encephalopathy.  I discussed with his ex-wife, at baseline, patient is alert and oriented, independent of daily activities although has been declining. -Slowly improving, although remains altered  Hypernatremia -Resolved   Acute kidney injury  -Baseline creatinine approximately 0.9-1 -Resolved   Multiple sclerosis -Neurology, Dr. Epimenio Foot -Stable, continue tecfidera if able to take PO    Bipolar disorder -Continue Abilify, Wellbutrin, Lexapro, lithium. Ex-wife reports patient has not been on lithium in over a month, unsure about other 3 medications if he has been taking it on a regular basis.  Patient's last dose of these medications were 6/30.  I will discontinue these for now until mentation improves  Alcohol abuse with acute delirium -Last alcoholic drink was over a week ago.  Doubt acute withdrawal at this point.   DVT prophylaxis: SCD Code Status: Full Family  Communication: No family at bedside today  Disposition Plan: Pending improvement in mentation. Transfer to med surg today. SLP eval for swallow.    Consultants:   GI  IR  Procedures:   EGD  Empiric  embolization of GDA   Antimicrobials:  Anti-infectives (From admission, onward)   None       Objective: Vitals:   10/25/17 0322 10/25/17 0400 10/25/17 0500 10/25/17 0600  BP:  132/61 (!) 93/40 (!) 145/84  Pulse:      Resp:  11 20 (!) 23  Temp: 97.7 F (36.5 C)     TempSrc: Oral     SpO2:  100% 99% 99%  Weight:      Height:        Intake/Output Summary (Last 24 hours) at 10/25/2017 0758 Last data filed at 10/25/2017 0600 Gross per 24 hour  Intake 5062 ml  Output 925 ml  Net 4137 ml   Filed Weights   10/23/17 1051 10/24/17 0200 10/25/17 0200  Weight: 70.4 kg (155 lb 3.3 oz) 70.9 kg (156 lb 4.9 oz) 71.6 kg (157 lb 13.6 oz)    Examination: General exam: Appears calm, confused  Respiratory system: Clear to auscultation. Respiratory effort normal. Cardiovascular system: S1 & S2 heard, RRR. No JVD, murmurs, rubs, gallops or clicks. No pedal edema. Gastrointestinal system: Abdomen is nondistended, soft and nontender. No organomegaly or masses felt. Normal bowel sounds heard. Central nervous system: Alert and oriented to place only. Extremities: Symmetric Skin: No rashes, lesions or ulcers    Data Reviewed: I have personally reviewed following labs and imaging studies  CBC: Recent Labs  Lab 10/21/17 0456  10/22/17 0307 10/22/17 1153 10/24/17 0324 10/24/17 0959 10/24/17 1838 10/25/17 0403  WBC 5.5  --  7.8 8.3 5.5  --   --  4.6  HGB 6.8*   < > 9.4* 9.4* 7.6* 7.3* 10.0* 10.3*  HCT 21.0*   < > 29.5* 28.3* 24.1* 23.5* 29.8* 30.9*  MCV 98.6  --  97.0 96.9 100.0  --   --  94.5  PLT 314  --  311 334 289  --   --  281   < > = values in this interval not displayed.   Basic Metabolic Panel: Recent Labs  Lab 10/21/17 0456 10/22/17 0307 10/23/17 0327 10/24/17 0324 10/25/17 0403  NA 144 147* 150* 146* 141  K 4.1 4.2 4.0 3.3* 3.8  CL 117* 119* 123* 119* 116*  CO2 21* 19* 22 22 22   GLUCOSE 108* 96 129* 110* 101*  BUN 44* 34* 28* 21 16  CREATININE 1.42* 1.32* 1.35*  1.09 0.97  CALCIUM 8.0* 8.2* 8.3* 8.0* 8.0*  MG  --   --   --  2.0 2.1  PHOS  --   --   --  3.1 3.2   GFR: Estimated Creatinine Clearance: 80 mL/min (by C-G formula based on SCr of 0.97 mg/dL). Liver Function Tests: No results for input(s): AST, ALT, ALKPHOS, BILITOT, PROT, ALBUMIN in the last 168 hours. No results for input(s): LIPASE, AMYLASE in the last 168 hours. No results for input(s): AMMONIA in the last 168 hours. Coagulation Profile: No results for input(s): INR, PROTIME in the last 168 hours. Cardiac Enzymes: No results for input(s): CKTOTAL, CKMB, CKMBINDEX, TROPONINI in the last 168 hours. BNP (last 3 results) No results for input(s): PROBNP in the last 8760 hours. HbA1C: No results for input(s): HGBA1C in the last 72 hours. CBG: Recent Labs  Lab 10/24/17 1531 10/24/17 1920 10/25/17  0018 10/25/17 0320 10/25/17 0718  GLUCAP 93 100* 95 93 87   Lipid Profile: No results for input(s): CHOL, HDL, LDLCALC, TRIG, CHOLHDL, LDLDIRECT in the last 72 hours. Thyroid Function Tests: No results for input(s): TSH, T4TOTAL, FREET4, T3FREE, THYROIDAB in the last 72 hours. Anemia Panel: No results for input(s): VITAMINB12, FOLATE, FERRITIN, TIBC, IRON, RETICCTPCT in the last 72 hours. Sepsis Labs: No results for input(s): PROCALCITON, LATICACIDVEN in the last 168 hours.  Recent Results (from the past 240 hour(s))  MRSA PCR Screening     Status: None   Collection Time: 10/16/17  3:21 PM  Result Value Ref Range Status   MRSA by PCR NEGATIVE NEGATIVE Final    Comment:        The GeneXpert MRSA Assay (FDA approved for NASAL specimens only), is one component of a comprehensive MRSA colonization surveillance program. It is not intended to diagnose MRSA infection nor to guide or monitor treatment for MRSA infections. Performed at [facility], [address] [place], [place] [address]        Radiology Studies: Dg Abd 1 View  Result Date:  10/23/2017 CLINICAL DATA:  NG tube placement. EXAM: ABDOMEN - 1 VIEW COMPARISON:  10/23/2017 FINDINGS: An NG tube is identified with tip overlying the gastric fundus. Nondistended gas-filled small bowel loops are noted, decreased. No other significant change IMPRESSION: NG tube with tip overlying gastric fundus. Electronically Signed   By: Harmon Pier M.D.   On: 10/23/2017 20:29   Dg Abd 1 View  Result Date: 10/23/2017 CLINICAL DATA:  Nasogastric tube placement EXAM: ABDOMEN - 1 VIEW COMPARISON:  Portable exam 1707 hours compared to 12/09/2006 FINDINGS: Tip of nasogastric tube projects over proximal stomach. Dilated air-filled loops of small bowel in the upper and mid abdomen. Small amount of stool and gas within colon. Multiple coils are present in the mid abdomen projecting over the upper lumbar spine from prior embolization procedure. Bones demineralized. IMPRESSION: Nasogastric tube projects over the proximal stomach. Air-filled small bowel loops throughout upper abdomen with gas and stool in colon, question ileus. Electronically Signed   By: Ulyses Southward M.D.   On: 10/23/2017 17:31      Scheduled Meds: . Dimethyl Fumarate  240 mg Oral BID  . folic acid  1 mg Oral Daily  . mouth rinse  15 mL Mouth Rinse BID  . pantoprazole (PROTONIX) IV  40 mg Intravenous Q12H  . QUEtiapine  25 mg Oral QHS  . sodium chloride flush  3 mL Intravenous Q12H  . thiamine  100 mg Oral Daily   Continuous Infusions:    LOS: 7 days    Time spent: 25 minutes   Noralee Stain, DO [facility] [email or web address] Password [email or web address] 10/25/2017, 7:58 AM

## 2017-10-25 NOTE — Progress Notes (Signed)
OT Cancellation Note  Patient Details Name: [name] MRN: [id number] DOB: May 08, 1955   Cancelled Treatment:    Reason Eval/Treat Not Completed: Other (comment).  Pt is sleeping soundly and medicated. Was agitated earlier.  We have cancelled him 5xs this week.  We will check back Monday to see if he is ready for OT.    [name] 10/25/2017, 2:17 PM  Marica Otter, OTR/L [phone] 10/25/2017

## 2017-10-25 NOTE — Evaluation (Signed)
SLP Cancellation Note  Patient Details Name: KAZDEN MULVENNA MRN: [id number] DOB: 1955/09/26   Cancelled treatment:       Reason Eval/Treat Not Completed: Other (comment);Fatigue/lethargy limiting ability to participate(pt snoring, RN reports having to give him medication due to agitation, not alert for po/eval, will continue efforts)   Chales Abrahams 10/25/2017, 12:29 PM Donavan Burnet, MS [facility] SLP [phone]

## 2017-10-25 NOTE — Progress Notes (Signed)
PT Cancellation Note  Patient Details Name: [name] MRN: [id number] DOB: 12/31/1955   Cancelled Treatment:    Reason Eval/Treat Not Completed: Pt still soundly sleeping due to meds. Will check back another day once pt is appropriate to work with PT   Rebeca Alert, MPT Pager: [phone]

## 2017-10-25 NOTE — Progress Notes (Signed)
Subjective: The patient was seen and examined at bedside.NG-tube accidentally removed.as per his nurse at bedside he is not taking his oral medications and has been nothing by mouth. He dropped his hemoglobin from 9.4-7.6 yesterday and received 2 units of PRBC transfusion.  Objective: Vital signs in last 24 hours: Temp:  [97.4 F (36.3 C)-98.5 F (36.9 C)] 98.4 F (36.9 C) (07/05 0808) Pulse Rate:  [70-77] 70 (07/05 0800) Resp:  [11-33] 21 (07/05 0800) BP: (85-145)/(40-93) 125/93 (07/05 0800) SpO2:  [81 %-100 %] 100 % (07/05 0800) Weight:  [71.6 kg (157 lb 13.6 oz)] 71.6 kg (157 lb 13.6 oz) (07/05 0200) Weight change: 1.2 kg (2 lb 10.3 oz) Last BM Date: 10/16/17  PE:not in distress, following commands intermittently, in soft mittens GENERAL:oriented to place and person not to time ABDOMEN:soft, nondistended EXTREMITIES:no deformity  Lab Results: Results for orders placed or performed during the hospital encounter of 10/16/17 (from the past 48 hour(s))  CBC     Status: Abnormal   Collection Time: 10/24/17  3:24 AM  Result Value Ref Range   WBC 5.5 4.0 - 10.5 K/uL   RBC 2.41 (L) 4.22 - 5.81 MIL/uL   Hemoglobin 7.6 (L) 13.0 - 17.0 g/dL   HCT 24.1 (L) 39.0 - 52.0 %   MCV 100.0 78.0 - 100.0 fL   MCH 31.5 26.0 - 34.0 pg   MCHC 31.5 30.0 - 36.0 g/dL   RDW 16.7 (H) 11.5 - 15.5 %   Platelets 289 150 - 400 K/uL    Comment: Performed at [facility], [place] [address], [place], [place] [address]  Basic metabolic panel     Status: Abnormal   Collection Time: 10/24/17  3:24 AM  Result Value Ref Range   Sodium 146 (H) 135 - 145 mmol/L   Potassium 3.3 (L) 3.5 - 5.1 mmol/L    Comment: DELTA CHECK NOTED REPEATED TO VERIFY NO VISIBLE HEMOLYSIS    Chloride 119 (H) 98 - 111 mmol/L    Comment: Please note change in reference range.   CO2 22 22 - 32 mmol/L   Glucose, Bld 110 (H) 70 - 99 mg/dL    Comment: Please note change in reference range.   BUN 21 8 - 23 mg/dL     Comment: Please note change in reference range.   Creatinine, Ser 1.09 0.61 - 1.24 mg/dL   Calcium 8.0 (L) 8.9 - 10.3 mg/dL   GFR calc non Af Amer >60 >60 mL/min   GFR calc Af Amer >60 >60 mL/min    Comment: (NOTE) The eGFR has been calculated using the CKD EPI equation. This calculation has not been validated in all clinical situations. eGFR's persistently <60 mL/min signify possible Chronic Kidney Disease.    Anion gap 5 5 - 15    Comment: Performed at [facility], [place] [address], [place], [place] [address]  Magnesium     Status: None   Collection Time: 10/24/17  3:24 AM  Result Value Ref Range   Magnesium 2.0 1.7 - 2.4 mg/dL    Comment: Performed at [facility], [place] [address] [place], [place] [address]  Phosphorus     Status: None   Collection Time: 10/24/17  3:24 AM  Result Value Ref Range   Phosphorus 3.1 2.5 - 4.6 mg/dL    Comment: Performed at [facility], [place] [address] [place], [place] [address]  Glucose, capillary     Status: Abnormal   Collection Time:  10/24/17  8:13 AM  Result Value Ref Range   Glucose-Capillary 118 (H) 70 - 99 mg/dL   Comment 1 Notify RN    Comment 2 Document in Chart   Hemoglobin and hematocrit, blood     Status: Abnormal   Collection Time: 10/24/17  9:59 AM  Result Value Ref Range   Hemoglobin 7.3 (L) 13.0 - 17.0 g/dL   HCT 23.5 (L) 39.0 - 52.0 %    Comment: Performed at [facility], [place] [address], [place], [place] [address]  Prepare RBC     Status: None   Collection Time: 10/24/17 10:56 AM  Result Value Ref Range   Order Confirmation      ORDER PROCESSED BY BLOOD BANK Performed at [place] [address], [place], [place] [address]   Glucose, capillary     Status: Abnormal   Collection Time: 10/24/17 12:42 PM  Result Value Ref Range   Glucose-Capillary 109 (H) 70 - 99 mg/dL   Comment 1 Notify RN    Comment 2 Document in Chart    Glucose, capillary     Status: None   Collection Time: 10/24/17  3:31 PM  Result Value Ref Range   Glucose-Capillary 93 70 - 99 mg/dL   Comment 1 Notify RN    Comment 2 Document in Chart   Hemoglobin and hematocrit, blood     Status: Abnormal   Collection Time: 10/24/17  6:38 PM  Result Value Ref Range   Hemoglobin 10.0 (L) 13.0 - 17.0 g/dL    Comment: DELTA CHECK NOTED POST TRANSFUSION SPECIMEN    HCT 29.8 (L) 39.0 - 52.0 %    Comment: Performed at [facility], [place] [address], [place], [place] [address]  Glucose, capillary     Status: Abnormal   Collection Time: 10/24/17  7:20 PM  Result Value Ref Range   Glucose-Capillary 100 (H) 70 - 99 mg/dL   Comment 1 Notify RN   Glucose, capillary     Status: None   Collection Time: 10/25/17 12:18 AM  Result Value Ref Range   Glucose-Capillary 95 70 - 99 mg/dL   Comment 1 Notify RN   Glucose, capillary     Status: None   Collection Time: 10/25/17  3:20 AM  Result Value Ref Range   Glucose-Capillary 93 70 - 99 mg/dL   Comment 1 Notify RN    Comment 2 Document in Chart   CBC     Status: Abnormal   Collection Time: 10/25/17  4:03 AM  Result Value Ref Range   WBC 4.6 4.0 - 10.5 K/uL   RBC 3.27 (L) 4.22 - 5.81 MIL/uL   Hemoglobin 10.3 (L) 13.0 - 17.0 g/dL   HCT 30.9 (L) 39.0 - 52.0 %   MCV 94.5 78.0 - 100.0 fL   MCH 31.5 26.0 - 34.0 pg   MCHC 33.3 30.0 - 36.0 g/dL   RDW 16.8 (H) 11.5 - 15.5 %   Platelets 281 150 - 400 K/uL    Comment: Performed at [facility], [place] [address] [place], [place] [address]  Basic metabolic panel     Status: Abnormal   Collection Time: 10/25/17  4:03 AM  Result Value Ref Range   Sodium 141 135 - 145 mmol/L   Potassium 3.8 3.5 - 5.1 mmol/L   Chloride 116 (H) 98 - 111 mmol/L    Comment: Please note change in reference range.   CO2 22 22 - 32  mmol/L   Glucose, Bld 101 (H) 70 - 99 mg/dL    Comment: Please note change in reference range.   BUN 16 8 - 23 mg/dL     Comment: Please note change in reference range.   Creatinine, Ser 0.97 0.61 - 1.24 mg/dL   Calcium 8.0 (L) 8.9 - 10.3 mg/dL   GFR calc non Af Amer >60 >60 mL/min   GFR calc Af Amer >60 >60 mL/min    Comment: (NOTE) The eGFR has been calculated using the CKD EPI equation. This calculation has not been validated in all clinical situations. eGFR's persistently <60 mL/min signify possible Chronic Kidney Disease.    Anion gap 3 (L) 5 - 15    Comment: Performed at [facility], [place] [address] [place], [place] [address]  Magnesium     Status: None   Collection Time: 10/25/17  4:03 AM  Result Value Ref Range   Magnesium 2.1 1.7 - 2.4 mg/dL    Comment: Performed at [facility], [place] [address] [place], [place] [address]  Phosphorus     Status: None   Collection Time: 10/25/17  4:03 AM  Result Value Ref Range   Phosphorus 3.2 2.5 - 4.6 mg/dL    Comment: Performed at [facility], [place] [address] [place] [address], [place], [place] [address]  Glucose, capillary     Status: None   Collection Time: 10/25/17  7:18 AM  Result Value Ref Range   Glucose-Capillary 87 70 - 99 mg/dL   Comment 1 Notify RN    Comment 2 Document in Chart     Studies/Results: Dg Abd 1 View  Result Date: 10/23/2017 CLINICAL DATA:  NG tube placement. EXAM: ABDOMEN - 1 VIEW COMPARISON:  10/23/2017 FINDINGS: An NG tube is identified with tip overlying the gastric fundus. Nondistended gas-filled small bowel loops are noted, decreased. No other significant change IMPRESSION: NG tube with tip overlying gastric fundus. Electronically Signed   By: Margarette Canada M.D.   On: 10/23/2017 20:29   Dg Abd 1 View  Result Date: 10/23/2017 CLINICAL DATA:  Nasogastric tube placement EXAM: ABDOMEN - 1 VIEW COMPARISON:  Portable exam 1707 hours compared to 12/09/2006 FINDINGS: Tip of nasogastric tube projects over proximal stomach. Dilated air-filled loops of small bowel in the upper and mid abdomen. Small  amount of stool and gas within colon. Multiple coils are present in the mid abdomen projecting over the upper lumbar spine from prior embolization procedure. Bones demineralized. IMPRESSION: Nasogastric tube projects over the proximal stomach. Air-filled small bowel loops throughout upper abdomen with gas and stool in colon, question ileus. Electronically Signed   By: Lavonia Dana M.D.   On: 10/23/2017 17:31    Medications: I have reviewed the patient's current medications.  Assessment: 1.Duodenal ulcer-status post EGD, status post IR guided embolization of GDA. Currently hemoglobin stable. Hemodynamically stable, BUN Steadily trending down 44/34/28/21/16  2.alcohol withdrawal, encephalopathy  Plan: 1.I have placed a consult for bedside swallow evaluation, if reported normal then,patient's diet can be advanced to regular diet. 2.continue folic acid 1 mg,thiamine 100 mg and restart MVI 3.continue PPI twice a day   Ronnette Juniper 10/25/2017, 10:14 AM   Pager [phone] If no answer or after 5 PM call [phone]

## 2017-10-25 NOTE — Progress Notes (Signed)
Palliative:  Elihue is much more alert today although extremely agitated. He does answer my questions (a bit sarcastic at times) but is clearly still confused but less so than yesterday. Unable to have GOC conversation with him at this time. Attempted to calm him. Spoke with RN. Continue haldol and ativan. Not accepting po meds well per RN. Could consider IV Zyprexa if agitation does not improve.   I did call and speak with [name]. Updated on status that he is more alert but very agitated. She was not surprised but happy that he is more alert. Hopeful that he will continue to come around and that agitation with subside. I did reinforce with [name] that his overall health is very concerning to me. We discussed decision making if Elwin were to decline now or in the future. I discussed that legally Harshil's son would be the surrogate decision maker since he is 62 yo. [name] understands but also believes that their son would not be able or desire to make these decisions. I explained that this does not mean that he would be forced to make decisions but could decline. In this case she confirms that herself and roommate/friend Viviann Spare are all that he has. She is hoping that it does not come to this but understands and appreciates the information. All questions/concerns addressed. Emotional support provided.   In the event of decisions needing to be made Codee's son would be the legal decision maker. If son declines to make decisions than [name] and Viviann Spare likely the only decision makers. He has a sister in [place] but no contact and nephews in other states but again no contact. Please call [phone] for palliative needs over the weekend. Otherwise will follow up on Monday.   25 min  Yong Channel, NP Palliative Medicine Team Pager # [phone] (M-F 8a-5p) Team Phone # [phone] (Nights/Weekends)

## 2017-10-26 LAB — BASIC METABOLIC PANEL
Anion gap: 6 (ref 5–15)
BUN: 12 mg/dL (ref 8–23)
CO2: 22 mmol/L (ref 22–32)
Calcium: 8.1 mg/dL — ABNORMAL LOW (ref 8.9–10.3)
Chloride: 117 mmol/L — ABNORMAL HIGH (ref 98–111)
Creatinine, Ser: 0.98 mg/dL (ref 0.61–1.24)
GFR calc Af Amer: >60 mL/min (ref >60)
GFR calc non Af Amer: >60 mL/min (ref >60)
Glucose, Bld: 93 mg/dL (ref 70–99)
Potassium: 3.6 mmol/L (ref 3.5–5.1)
Sodium: 145 mmol/L (ref 135–145)

## 2017-10-26 LAB — CBC
HCT: 32 % — ABNORMAL LOW (ref 39.0–52.0)
Hemoglobin: 10.5 g/dL — ABNORMAL LOW (ref 13.0–17.0)
MCH: 31.5 pg (ref 26.0–34.0)
MCHC: 32.8 g/dL (ref 30.0–36.0)
MCV: 96.1 fL (ref 78.0–100.0)
Platelets: 286 10*3/uL (ref 150–400)
RBC: 3.33 MIL/uL — ABNORMAL LOW (ref 4.22–5.81)
RDW: 16.4 % — ABNORMAL HIGH (ref 11.5–15.5)
WBC: 6.6 10*3/uL (ref 4.0–10.5)

## 2017-10-26 LAB — GLUCOSE, CAPILLARY
Glucose-Capillary: 74 mg/dL (ref 70–99)
Glucose-Capillary: 86 mg/dL (ref 70–99)
Glucose-Capillary: 94 mg/dL (ref 70–99)

## 2017-10-26 LAB — PHOSPHORUS: Phosphorus: 3.4 mg/dL (ref 2.5–4.6)

## 2017-10-26 LAB — MAGNESIUM: Magnesium: 2.1 mg/dL (ref 1.7–2.4)

## 2017-10-26 NOTE — Progress Notes (Signed)
PROGRESS NOTE    [name]  ZOX:[id number] DOB: 1956-04-22 DOA: 10/16/2017 PCP: Rinaldo Cloud, MD     Brief Narrative:  Alexander Quails McGeeis a 62 y.o.malewith medical history significant ofMS (follows with Dr. Rogene Houston disorder, alcohol and tobacco abuse, presented to the emergency department complaining of abdominal pain with nausea and vomiting. Patient is poor historian. He reports to 2-3 days of abdominal pain associated with nausea and vomiting. Patient reported epigastric pain started approximately 2 weeks ago which has been worse with eating. Denies diarrhea, constipation and fever or chills. Last alcohol use 3 days ago. Normally drinks occasionally but can drink up to 12 pack a day. Other associated symptoms include weakness/faint.Denies NSAID/ASA use, no history of GI bleeding in the past. Patient denies dark stools.  Patient underwent EGD which revealed large, deep duodenal bulb ulcer.  Patient underwent empiric embolization by IR on 7/1.  Hospitalization further complicated by altered mental status.  New events last 24 hours / Subjective: No new issues. Able to tell me that he is at [facility]. Then looks at the ceiling and tells me that there is a crack and that should be repaired (no cracks noted on my evaluation).   Assessment & Plan:   Principal Problem:   Symptomatic anemia Active Problems:   MS (multiple sclerosis) (HCC)   Bipolar affective disorder, depressed, severe (HCC)   Bipolar I disorder (HCC)   Upper GI bleeding   GI bleed   Acute encephalopathy   Goals of care, counseling/discussion   Palliative care encounter   Symptomatic anemia/ Upper GI bleed -Presented with weakness, palpitations and soft blood pressure -Chronic use of alcohol; denies use of NSAIDs -Hemoglobin approximately 14-15, on admission was 10.1 with positive FOBT -Was on Protonix drip for 48 hours, and transitioned to BID -Anemia panel showed an iron of 77, ferritin  of 122, vitamin B12 180, folate 11.4 -Given that vitamin B12 is on the low normal side, dose of B12 given -Gastroenterology consulted and appreciated, status post EGD 6/27: Showed very large, deep and created duodenal bulb/duodenal sweep ulcer with pigmented spots.  LA grade D esophagitis -S/p empiric embolization of GDA on 10/21/2017 by IR  -No further bleeding episodes noted. He had low BP and low Hgb on 7/4. He was given 2u pRBC with improvement. Hgb remains stable today   Acute metabolic encephalopathy -Unclear etiology, and chart review, it appears that patient started to decline around 7/1.  Previously, patient was alert and oriented x3.  There was concern for alcohol withdrawal contributing to his encephalopathy.  I discussed with his ex-wife, at baseline, patient is alert and oriented, independent of daily activities although has been declining. -Slowly improving, although remains altered  Hypernatremia -Resolved   Acute kidney injury  -Baseline creatinine approximately 0.9-1 -Resolved   Multiple sclerosis -Neurology, Dr. Epimenio Foot -Stable, continue tecfidera if able to take PO    Bipolar disorder -Continue Abilify, Wellbutrin, Lexapro, lithium. Ex-wife reports patient has not been on lithium in over a month, unsure about other 3 medications if he has been taking it on a regular basis.  Patient's last dose of these medications were 6/30.  I will discontinue these for now until mentation improves  Alcohol abuse with acute delirium -Last alcoholic drink was over a week ago   DVT prophylaxis: SCD Code Status: Full Family Communication: No family at bedside today  Disposition Plan: Pending improvement in mentation. Continue SLP eval and advance diet as able. Will need PT OT evaluation for  disposition    Consultants:   GI  IR  Procedures:   EGD  Empiric embolization of GDA   Antimicrobials:  Anti-infectives (From admission, onward)   None        Objective: Vitals:   10/25/17 1638 10/25/17 2025 10/26/17 0021 10/26/17 0448  BP: 136/83 115/76  123/66  Pulse: 84 71  65  Resp: 18 17  18   Temp: 97.8 F (36.6 C)   99 F (37.2 C)  TempSrc: Oral   Axillary  SpO2: 98%   96%  Weight:   71.3 kg (157 lb 3 oz) 71.3 kg (157 lb 3 oz)  Height:        Intake/Output Summary (Last 24 hours) at 10/26/2017 1029 Last data filed at 10/26/2017 0527 Gross per 24 hour  Intake -  Output 1500 ml  Net -1500 ml   Filed Weights   10/25/17 0200 10/26/17 0021 10/26/17 0448  Weight: 71.6 kg (157 lb 13.6 oz) 71.3 kg (157 lb 3 oz) 71.3 kg (157 lb 3 oz)    Examination: General exam: Appears calm and comfortable  Respiratory system: Clear to auscultation. Respiratory effort normal. Cardiovascular system: S1 & S2 heard, RRR. No JVD, murmurs, rubs, gallops or clicks. No pedal edema. Gastrointestinal system: Abdomen is nondistended, soft and nontender. No organomegaly or masses felt. Normal bowel sounds heard. Central nervous system: Alert and oriented to self and place  Extremities: Symmetric 5 x 5 power. Skin: No rashes, lesions or ulcers   Data Reviewed: I have personally reviewed following labs and imaging studies  CBC: Recent Labs  Lab 10/22/17 0307 10/22/17 1153 10/24/17 0324 10/24/17 0959 10/24/17 1838 10/25/17 0403 10/26/17 0511  WBC 7.8 8.3 5.5  --   --  4.6 6.6  HGB 9.4* 9.4* 7.6* 7.3* 10.0* 10.3* 10.5*  HCT 29.5* 28.3* 24.1* 23.5* 29.8* 30.9* 32.0*  MCV 97.0 96.9 100.0  --   --  94.5 96.1  PLT 311 334 289  --   --  281 286   Basic Metabolic Panel: Recent Labs  Lab 10/22/17 0307 10/23/17 0327 10/24/17 0324 10/25/17 0403 10/26/17 0511  NA 147* 150* 146* 141 145  K 4.2 4.0 3.3* 3.8 3.6  CL 119* 123* 119* 116* 117*  CO2 19* 22 22 22 22   GLUCOSE 96 129* 110* 101* 93  BUN 34* 28* 21 16 12   CREATININE 1.32* 1.35* 1.09 0.97 0.98  CALCIUM 8.2* 8.3* 8.0* 8.0* 8.1*  MG  --   --  2.0 2.1 2.1  PHOS  --   --  3.1 3.2 3.4    GFR: Estimated Creatinine Clearance: 78.8 mL/min (by C-G formula based on SCr of 0.98 mg/dL). Liver Function Tests: No results for input(s): AST, ALT, ALKPHOS, BILITOT, PROT, ALBUMIN in the last 168 hours. No results for input(s): LIPASE, AMYLASE in the last 168 hours. No results for input(s): AMMONIA in the last 168 hours. Coagulation Profile: No results for input(s): INR, PROTIME in the last 168 hours. Cardiac Enzymes: No results for input(s): CKTOTAL, CKMB, CKMBINDEX, TROPONINI in the last 168 hours. BNP (last 3 results) No results for input(s): PROBNP in the last 8760 hours. HbA1C: No results for input(s): HGBA1C in the last 72 hours. CBG: Recent Labs  Lab 10/25/17 1201 10/25/17 1636 10/25/17 2027 10/26/17 0012 10/26/17 0446  GLUCAP 86 87 81 74 86   Lipid Profile: No results for input(s): CHOL, HDL, LDLCALC, TRIG, CHOLHDL, LDLDIRECT in the last 72 hours. Thyroid Function Tests: No results for input(s): TSH,  T4TOTAL, FREET4, T3FREE, THYROIDAB in the last 72 hours. Anemia Panel: No results for input(s): VITAMINB12, FOLATE, FERRITIN, TIBC, IRON, RETICCTPCT in the last 72 hours. Sepsis Labs: No results for input(s): PROCALCITON, LATICACIDVEN in the last 168 hours.  Recent Results (from the past 240 hour(s))  MRSA PCR Screening     Status: None   Collection Time: 10/16/17  3:21 PM  Result Value Ref Range Status   MRSA by PCR NEGATIVE NEGATIVE Final    Comment:        The GeneXpert MRSA Assay (FDA approved for NASAL specimens only), is one component of a comprehensive MRSA colonization surveillance program. It is not intended to diagnose MRSA infection nor to guide or monitor treatment for MRSA infections. Performed at [facility], [address], [place], [place] [address]        Radiology Studies: No results found.    Scheduled Meds: . Dimethyl Fumarate  240 mg Oral BID  . folic acid  1 mg Oral Daily  . mouth rinse  15 mL Mouth  Rinse BID  . multivitamin with minerals  1 tablet Oral Daily  . pantoprazole (PROTONIX) IV  40 mg Intravenous Q12H  . polyethylene glycol  17 g Oral Daily  . QUEtiapine  25 mg Oral QHS  . senna-docusate  1 tablet Oral QHS  . sodium chloride flush  3 mL Intravenous Q12H  . thiamine  100 mg Oral Daily   Continuous Infusions:    LOS: 8 days    Time spent: 20 minutes   Noralee Stain, DO [facility] [email or web address] Password [email or web address] 10/26/2017, 10:29 AM

## 2017-10-26 NOTE — Evaluation (Signed)
Clinical/Bedside Swallow Evaluation Patient Details  Name: [name] MRN: [id number] Date of Birth: May 11, 1955  Today's Date: 10/26/2017 Time: SLP Start Time (ACUTE ONLY): 1542 SLP Stop Time (ACUTE ONLY): 1602 SLP Time Calculation (min) (ACUTE ONLY): 20 min  Past Medical History:  Past Medical History:  Diagnosis Date  . Depression   . Mental disorder   . Movement disorder   . MS (multiple sclerosis) (HCC)   . Multiple sclerosis, relapsing-remitting (HCC)   . Neuromuscular disorder (HCC)   . Vision abnormalities    Past Surgical History:  Past Surgical History:  Procedure Laterality Date  . BIOPSY  10/17/2017   Procedure: BIOPSY;  Surgeon: Kathi Der, MD;  Location: [facility];  Service: Gastroenterology;;  . ESOPHAGOGASTRODUODENOSCOPY (EGD) WITH PROPOFOL N/A 10/17/2017   Procedure: ESOPHAGOGASTRODUODENOSCOPY (EGD) WITH PROPOFOL;  Surgeon: Kathi Der, MD;  Location: [facility];  Service: Gastroenterology;  Laterality: N/A;  . IR ANGIOGRAM SELECTIVE EACH ADDITIONAL VESSEL  10/21/2017  . IR ANGIOGRAM SELECTIVE EACH ADDITIONAL VESSEL  10/21/2017  . IR ANGIOGRAM VISCERAL SELECTIVE  10/21/2017  . IR EMBO ART  VEN HEMORR LYMPH EXTRAV  INC GUIDE ROADMAPPING  10/21/2017  . IR US GUIDE VASC ACCESS RIGHT  10/21/2017  . TONSILLECTOMY     HPI:  62 y.o. male with medical history significant of MS, bipolar disorder, alcohol and tobacco abuse, presented to the emergency department on 10/16/17 complaining of abdominal pain with nausea and vomiting.  Patient is poor historian.  He reports to 2-3 days of abdominal pain associated with bloody nausea and vomiting.  Patient reported epigastric pain started approximately 2 weeks ago which has been worse with eating.  Denies diarrhea, constipation and fever or chills.  Last alcohol use 3 days ago.  Normally drinks occasionally  but can drink up to 12 pack a day.  Other associated symptoms include weakness/faint.  Denies NSAID/ASA use, no history of  GI bleeding in the past.  Patient denies dark stools; BSE attempted on 10/25/17 without success d/t decreased mentation/level of alertness; nursing stated mentation improved overall.   Assessment / Plan / Recommendation Clinical Impression   Pt presents with cognitive-based dysphagia characterized by intermittent oral holding, decreased sustained attention during PO intake, impaired mastication and suspected delay in the initiation of the swallow; no overt s/s of aspiration noted during varying volumes of thin liquids (I.e.: spoon, cup, straw), puree and soft solids.  Pt has missing dentition in poor condition which impacts mastication, so mechanical/soft diet with thin liquids recommended if medically able to consume this consistency d/t recent GI issues; ST will f/u for diet tolerance/education re: swallowing/aspiration precautions with pt/family as able.   SLP Visit Diagnosis: Dysphagia, unspecified (R13.10)    Aspiration Risk  Mild aspiration risk    Diet Recommendation   Dysphagia 3 (mechanical/soft)/thin liquids  Medication Administration: Crushed with puree    Other  Recommendations Oral Care Recommendations: Oral care QID   Follow up Recommendations 24 hour supervision/assistance      Frequency and Duration min 2x/week  1 week       Prognosis Prognosis for Safe Diet Advancement: Fair Barriers to Reach Goals: Cognitive deficits      Swallow Study   General Date of Onset: 10/16/17 HPI: 62 y.o. male with medical history significant of MS, bipolar disorder, alcohol and tobacco abuse, presented to the emergency department complaining of abdominal pain with nausea and vomiting.  Patient is poor historian.  He reports to 2-3 days of abdominal pain associated with bloody nausea  and vomiting.  Patient reported epigastric pain started approximately 2 weeks ago which has been worse with eating.  Denies diarrhea, constipation and fever or chills.  Last alcohol use 3 days ago.  Normally  drinks occasionally  but can drink up to 12 pack a day.  Other associated symptoms include weakness/faint.  Denies NSAID/ASA use, no history of GI bleeding in the past.  Patient denies dark stools Type of Study: Bedside Swallow Evaluation Previous Swallow Assessment: (none noted) Diet Prior to this Study: Thin liquids Temperature Spikes Noted: No Respiratory Status: Room air Behavior/Cognition: Alert;Confused Oral Cavity Assessment: Dry Oral Care Completed by SLP: Recent completion by staff Oral Cavity - Dentition: Poor condition;Missing dentition Vision: Functional for self-feeding Self-Feeding Abilities: Able to feed self;Needs assist Patient Positioning: Upright in bed Baseline Vocal Quality: Low vocal intensity Volitional Cough: Cognitively unable to elicit Volitional Swallow: Able to elicit    Oral/Motor/Sensory Function Overall Oral Motor/Sensory Function: Generalized oral weakness Facial ROM: Other (Comment)(DTA d/t pt's ability to follow commands) Facial Symmetry: Within Functional Limits Lingual Symmetry: Within Functional Limits Lingual Strength: Reduced   Ice Chips Ice chips: Within functional limits Presentation: Spoon   Thin Liquid Thin Liquid: Within functional limits Presentation: Straw;Cup    Nectar Thick Nectar Thick Liquid: Not tested   Honey Thick Honey Thick Liquid: Not tested   Puree Puree: Impaired Presentation: Self Fed Oral Phase Functional Implications: Oral holding Pharyngeal Phase Impairments: Suspected delayed Swallow   Solid      Solid: Impaired Presentation: Self Fed Oral Phase Impairments: Impaired mastication Oral Phase Functional Implications: Impaired mastication;Prolonged oral transit Pharyngeal Phase Impairments: Suspected delayed Swallow        Tressie Stalker, M.S., CCC-SLP 10/26/2017,4:17 PM

## 2017-10-26 NOTE — Progress Notes (Signed)
[facility] Gastroenterology Progress Note  [name] 62 y.o. Aug 04, 1955  CC:  GI bleed   Subjective: patient is more alert and cooperative today. Denied nausea vomiting. Currently on clear liquid diet. Denied abdominal pain.  ROS : negative for chest pain and shortness of breath.  Objective: Vital signs in last 24 hours: Vitals:   10/25/17 2025 10/26/17 0448  BP: 115/76 123/66  Pulse: 71 65  Resp: 17 18  Temp:  99 F (37.2 C)  SpO2:  96%    Physical Exam:  General.alert and oriented 3. Cooperative. Abdomen. Soft, nontender, nondistended, bowel sounds present. No peritoneal signs HEART - rate rhythm regular. LE : no edema    Lab Results: Recent Labs    10/25/17 0403 10/26/17 0511  NA 141 145  K 3.8 3.6  CL 116* 117*  CO2 22 22  GLUCOSE 101* 93  BUN 16 12  CREATININE 0.97 0.98  CALCIUM 8.0* 8.1*  MG 2.1 2.1  PHOS 3.2 3.4   No results for input(s): AST, ALT, ALKPHOS, BILITOT, PROT, ALBUMIN in the last 72 hours. Recent Labs    10/25/17 0403 10/26/17 0511  WBC 4.6 6.6  HGB 10.3* 10.5*  HCT 30.9* 32.0*  MCV 94.5 96.1  PLT 281 286   No results for input(s): LABPROT, INR in the last 72 hours.    Assessment/Plan: - upper GI bleed. EGD  (06/27) showing very large and deep duodenal bulb/duodenal sweep ulcer with pigmented material and evidence of recent bleeding.biopsies negative for H. Pylori.underwent interventional radiology guided embolization on 10/21/2017 because of ongoing drop in hemoglobin. - LA Grade D esophagitis  - Acute blood loss anemia. - alcohol withdrawal - improving   Recommendations -------------------------- - hemoglobin stable now. Has received multiple transfusions throughout the admission. - continue IV PPI for now - Okay to advance diet from GI standpoint, diet to be advanced according to speech therapy evaluation. - recommend repeat EGD in 8 weeks to document healing of esophagitis as well as follow-up on large duodenal bulb  ulcer. - avoid NSAIDs. Avoid alcohol. GI will follow.    Kathi Der MD, FACP 10/26/2017, 2:29 PM  Contact #  [phone]

## 2017-10-27 LAB — BASIC METABOLIC PANEL
Anion gap: 7 (ref 5–15)
BUN: 10 mg/dL (ref 8–23)
CO2: 20 mmol/L — ABNORMAL LOW (ref 22–32)
Calcium: 8 mg/dL — ABNORMAL LOW (ref 8.9–10.3)
Chloride: 115 mmol/L — ABNORMAL HIGH (ref 98–111)
Creatinine, Ser: 0.99 mg/dL (ref 0.61–1.24)
GFR calc Af Amer: >60 mL/min (ref >60)
GFR calc non Af Amer: >60 mL/min (ref >60)
Glucose, Bld: 87 mg/dL (ref 70–99)
Potassium: 4 mmol/L (ref 3.5–5.1)
Sodium: 142 mmol/L (ref 135–145)

## 2017-10-27 LAB — GLUCOSE, CAPILLARY: Glucose-Capillary: 121 mg/dL — ABNORMAL HIGH (ref 70–99)

## 2017-10-27 LAB — CBC
HCT: 33.1 % — ABNORMAL LOW (ref 39.0–52.0)
Hemoglobin: 10.8 g/dL — ABNORMAL LOW (ref 13.0–17.0)
MCH: 31.7 pg (ref 26.0–34.0)
MCHC: 32.6 g/dL (ref 30.0–36.0)
MCV: 97.1 fL (ref 78.0–100.0)
Platelets: 296 10*3/uL (ref 150–400)
RBC: 3.41 MIL/uL — ABNORMAL LOW (ref 4.22–5.81)
RDW: 15.9 % — ABNORMAL HIGH (ref 11.5–15.5)
WBC: 4.9 10*3/uL (ref 4.0–10.5)

## 2017-10-27 LAB — PHOSPHORUS: Phosphorus: 3 mg/dL (ref 2.5–4.6)

## 2017-10-27 LAB — MAGNESIUM: Magnesium: 2.1 mg/dL (ref 1.7–2.4)

## 2017-10-27 MED ORDER — PANTOPRAZOLE SODIUM 40 MG PO TBEC
40.0000 mg | DELAYED_RELEASE_TABLET | Freq: Two times a day (BID) | ORAL | Status: DC
  Administered 2017-10-27 – 2017-10-30 (×4): 40 mg via ORAL
  Filled 2017-10-27 (×6): qty 1

## 2017-10-27 NOTE — Progress Notes (Signed)
[facility] Gastroenterology Progress Note  [name] 62 y.o. Apr 01, 1956  CC:  GI bleed   Subjective: patient is more alert and cooperative today. Denied nausea vomiting.  Denied abdominal pain.  ROS : negative for chest pain and shortness of breath.  Objective: Vital signs in last 24 hours: Vitals:   10/26/17 1445 10/26/17 2131  BP: 96/65 (!) 87/55  Pulse: 60 86  Resp: 16 16  Temp:  98.2 F (36.8 C)  SpO2: 100% 98%    Physical Exam:  General.alert and oriented 2 . Cooperative. Abdomen. Soft, nontender, nondistended, bowel sounds present. No peritoneal signs HEART - rate rhythm regular. LE : no edema    Lab Results: Recent Labs    10/26/17 0511 10/27/17 0451  NA 145 142  K 3.6 4.0  CL 117* 115*  CO2 22 20*  GLUCOSE 93 87  BUN 12 10  CREATININE 0.98 0.99  CALCIUM 8.1* 8.0*  MG 2.1 2.1  PHOS 3.4 3.0   No results for input(s): AST, ALT, ALKPHOS, BILITOT, PROT, ALBUMIN in the last 72 hours. Recent Labs    10/26/17 0511 10/27/17 0451  WBC 6.6 4.9  HGB 10.5* 10.8*  HCT 32.0* 33.1*  MCV 96.1 97.1  PLT 286 296   No results for input(s): LABPROT, INR in the last 72 hours.    Assessment/Plan: - upper GI bleed. EGD  (06/27) showing very large and deep duodenal bulb/duodenal sweep ulcer with pigmented material and evidence of recent bleeding.biopsies negative for H. Pylori.underwent interventional radiology guided embolization on 10/21/2017 because of ongoing drop in hemoglobin. - LA Grade D esophagitis  - Acute blood loss anemia. - alcohol withdrawal - improving - confusion. Improving   Recommendations -------------------------- - hemoglobin stable now.  - continue IV PPI for now while in the hospital. Change to by mouth twice a day on discharge for 8 weeks. He needs to be on once a day 40 mg PPI indefinitely after that. - Okay to advance diet from GI standpoint, diet to be advanced according to speech therapy evaluation. - recommend repeat EGD in 8 weeks  to document healing of esophagitis as well as follow-up on large duodenal bulb ulcer. - avoid NSAIDs. Avoid alcohol.  - GI will sign off. Follow-up in GI clinic in 6 weeks. Call us back if needed    Kathi Der MD, FACP 10/27/2017, 12:08 PM  Contact #  [phone]

## 2017-10-27 NOTE — Progress Notes (Addendum)
PROGRESS NOTE    [name]  EXB:[id number] DOB: 1955-09-16 DOA: 10/16/2017 PCP: Rinaldo Cloud, MD     Brief Narrative:  Alexander Quails McGeeis a 62 y.o.malewith medical history significant ofMS (follows with Dr. Rogene Houston disorder, alcohol and tobacco abuse, presented to the emergency department complaining of abdominal pain with nausea and vomiting. Patient is poor historian. He reports to 2-3 days of abdominal pain associated with nausea and vomiting. Patient reported epigastric pain started approximately 2 weeks ago which has been worse with eating. Denies diarrhea, constipation and fever or chills. Last alcohol use 3 days ago. Normally drinks occasionally but can drink up to 12 pack a day. Other associated symptoms include weakness/faint.Denies NSAID/ASA use, no history of GI bleeding in the past. Patient denies dark stools.  Patient underwent EGD which revealed large, deep duodenal bulb ulcer.  Patient underwent empiric embolization by IR on 7/1.  Hospitalization further complicated by altered mental status.  New events last 24 hours / Subjective: Patient awake and alert this morning. He states that he is bored. Tells me he had 3 large BM yesterday. Wants coffee. SLP upgraded diet to dysphagia diet yesterday.   Assessment & Plan:   Principal Problem:   Symptomatic anemia Active Problems:   MS (multiple sclerosis) (HCC)   Bipolar affective disorder, depressed, severe (HCC)   Bipolar I disorder (HCC)   Upper GI bleeding   GI bleed   Acute encephalopathy   Goals of care, counseling/discussion   Palliative care encounter   Symptomatic anemia/ Upper GI bleed -Presented with weakness, palpitations and soft blood pressure -Chronic use of alcohol; denies use of NSAIDs -Hemoglobin approximately 14-15, on admission was 10.1 with positive FOBT -Was on Protonix drip for 48 hours, and transitioned to BID -Anemia panel showed an iron of 77, ferritin of 122, vitamin B12 180,  folate 11.4 -Given that vitamin B12 is on the low normal side, dose of B12 given -Gastroenterology consulted and appreciated, status post EGD 6/27: Showed very large, deep and created duodenal bulb/duodenal sweep ulcer with pigmented spots.  LA grade D esophagitis -S/p empiric embolization of GDA on 10/21/2017 by IR  -No further bleeding episodes noted. He had low BP and low Hgb on 7/4. He was given 2u pRBC with improvement. Hgb stable today.  -Need repeat EGD in 8 weeks to document healing of esophagitis as well as follow-up on large duodenal bulb ulcer   Acute metabolic encephalopathy -Unclear etiology, and chart review, it appears that patient started to decline around 7/1.  Previously, patient was alert and oriented x3.  There was concern for alcohol withdrawal contributing to his encephalopathy.  I discussed with his ex-wife, at baseline, patient is alert and oriented, independent of daily activities although has been declining. -Slowly improving   Hypernatremia -Resolved   Acute kidney injury  -Baseline creatinine approximately 0.9-1 -Resolved   Multiple sclerosis -Neurology, Dr. Epimenio Foot -Stable, continue tecfidera if able to take PO. Patient needs to bring in home supply   Bipolar disorder -Continue Abilify, Wellbutrin, Lexapro, lithium. Ex-wife reports patient has not been on lithium in over a month, unsure about other 3 medications if he has been taking it on a regular basis.  Patient's last dose of these medications were 6/30.  I will discontinue these for now until mentation improves  Alcohol abuse with acute delirium -Last alcoholic drink was over a week ago   DVT prophylaxis: SCD Code Status: Full Family Communication: No family at bedside today  Disposition Plan: Pending improvement in  mentation. Will need PT OT evaluation for disposition    Consultants:   GI  IR  Procedures:   EGD  Empiric embolization of GDA   Antimicrobials:  Anti-infectives (From  admission, onward)   None       Objective: Vitals:   10/26/17 0021 10/26/17 0448 10/26/17 1445 10/26/17 2131  BP:  123/66 96/65 (!) 87/55  Pulse:  65 60 86  Resp:  18 16 16   Temp:  99 F (37.2 C)  98.2 F (36.8 C)  TempSrc:  Axillary  Oral  SpO2:  96% 100% 98%  Weight: 71.3 kg (157 lb 3 oz) 71.3 kg (157 lb 3 oz)    Height:        Intake/Output Summary (Last 24 hours) at 10/27/2017 1033 Last data filed at 10/27/2017 1010 Gross per 24 hour  Intake 3 ml  Output 201 ml  Net -198 ml   Filed Weights   10/25/17 0200 10/26/17 0021 10/26/17 0448  Weight: 71.6 kg (157 lb 13.6 oz) 71.3 kg (157 lb 3 oz) 71.3 kg (157 lb 3 oz)    Examination: General exam: Appears calm and comfortable  Respiratory system: Clear to auscultation. Respiratory effort normal. Cardiovascular system: S1 & S2 heard, RRR. No JVD, murmurs, rubs, gallops or clicks. No pedal edema. Gastrointestinal system: Abdomen is nondistended, soft and nontender. No organomegaly or masses felt. Normal bowel sounds heard. Central nervous system: Alert, nonfocal exam  Extremities: Symmetric 5 x 5 power. Skin: No rashes, lesions or ulcers Psychiatry: Stable, able to have conversation today, but toward end of exam he mentions someone standing behind me (there was no one behind me during exam)    Data Reviewed: I have personally reviewed following labs and imaging studies  CBC: Recent Labs  Lab 10/22/17 1153 10/24/17 0324 10/24/17 0959 10/24/17 1838 10/25/17 0403 10/26/17 0511 10/27/17 0451  WBC 8.3 5.5  --   --  4.6 6.6 4.9  HGB 9.4* 7.6* 7.3* 10.0* 10.3* 10.5* 10.8*  HCT 28.3* 24.1* 23.5* 29.8* 30.9* 32.0* 33.1*  MCV 96.9 100.0  --   --  94.5 96.1 97.1  PLT 334 289  --   --  281 286 296   Basic Metabolic Panel: Recent Labs  Lab 10/23/17 0327 10/24/17 0324 10/25/17 0403 10/26/17 0511 10/27/17 0451  NA 150* 146* 141 145 142  K 4.0 3.3* 3.8 3.6 4.0  CL 123* 119* 116* 117* 115*  CO2 22 22 22 22  20*  GLUCOSE  129* 110* 101* 93 87  BUN 28* 21 16 12 10   CREATININE 1.35* 1.09 0.97 0.98 0.99  CALCIUM 8.3* 8.0* 8.0* 8.1* 8.0*  MG  --  2.0 2.1 2.1 2.1  PHOS  --  3.1 3.2 3.4 3.0   GFR: Estimated Creatinine Clearance: 78 mL/min (by C-G formula based on SCr of 0.99 mg/dL). Liver Function Tests: No results for input(s): AST, ALT, ALKPHOS, BILITOT, PROT, ALBUMIN in the last 168 hours. No results for input(s): LIPASE, AMYLASE in the last 168 hours. No results for input(s): AMMONIA in the last 168 hours. Coagulation Profile: No results for input(s): INR, PROTIME in the last 168 hours. Cardiac Enzymes: No results for input(s): CKTOTAL, CKMB, CKMBINDEX, TROPONINI in the last 168 hours. BNP (last 3 results) No results for input(s): PROBNP in the last 8760 hours. HbA1C: No results for input(s): HGBA1C in the last 72 hours. CBG: Recent Labs  Lab 10/25/17 1636 10/25/17 2027 10/26/17 0012 10/26/17 0446 10/26/17 1556  GLUCAP 87 81 74 86  94   Lipid Profile: No results for input(s): CHOL, HDL, LDLCALC, TRIG, CHOLHDL, LDLDIRECT in the last 72 hours. Thyroid Function Tests: No results for input(s): TSH, T4TOTAL, FREET4, T3FREE, THYROIDAB in the last 72 hours. Anemia Panel: No results for input(s): VITAMINB12, FOLATE, FERRITIN, TIBC, IRON, RETICCTPCT in the last 72 hours. Sepsis Labs: No results for input(s): PROCALCITON, LATICACIDVEN in the last 168 hours.  No results found for this or any previous visit (from the past 240 hour(s)).     Radiology Studies: No results found.    Scheduled Meds: . Dimethyl Fumarate  240 mg Oral BID  . folic acid  1 mg Oral Daily  . mouth rinse  15 mL Mouth Rinse BID  . multivitamin with minerals  1 tablet Oral Daily  . pantoprazole (PROTONIX) IV  40 mg Intravenous Q12H  . polyethylene glycol  17 g Oral Daily  . QUEtiapine  25 mg Oral QHS  . senna-docusate  1 tablet Oral QHS  . sodium chloride flush  3 mL Intravenous Q12H  . thiamine  100 mg Oral Daily    Continuous Infusions:    LOS: 9 days    Time spent: 20 minutes   Noralee Stain, DO [facility] [email or web address] Password [facility] 10/27/2017, 10:33 AM

## 2017-10-28 LAB — BASIC METABOLIC PANEL
Anion gap: 8 (ref 5–15)
BUN: 14 mg/dL (ref 8–23)
CO2: 22 mmol/L (ref 22–32)
Calcium: 8.4 mg/dL — ABNORMAL LOW (ref 8.9–10.3)
Chloride: 115 mmol/L — ABNORMAL HIGH (ref 98–111)
Creatinine, Ser: 0.96 mg/dL (ref 0.61–1.24)
GFR calc Af Amer: >60 mL/min (ref >60)
GFR calc non Af Amer: >60 mL/min (ref >60)
Glucose, Bld: 100 mg/dL — ABNORMAL HIGH (ref 70–99)
Potassium: 4.1 mmol/L (ref 3.5–5.1)
Sodium: 145 mmol/L (ref 135–145)

## 2017-10-28 LAB — CBC
HCT: 31.5 % — ABNORMAL LOW (ref 39.0–52.0)
Hemoglobin: 10.3 g/dL — ABNORMAL LOW (ref 13.0–17.0)
MCH: 31.6 pg (ref 26.0–34.0)
MCHC: 32.7 g/dL (ref 30.0–36.0)
MCV: 96.6 fL (ref 78.0–100.0)
Platelets: 296 10*3/uL (ref 150–400)
RBC: 3.26 MIL/uL — ABNORMAL LOW (ref 4.22–5.81)
RDW: 15.5 % (ref 11.5–15.5)
WBC: 4.6 10*3/uL (ref 4.0–10.5)

## 2017-10-28 LAB — PHOSPHORUS: Phosphorus: 3.5 mg/dL (ref 2.5–4.6)

## 2017-10-28 LAB — MAGNESIUM: Magnesium: 2.1 mg/dL (ref 1.7–2.4)

## 2017-10-28 MED ORDER — HALOPERIDOL LACTATE 5 MG/ML IJ SOLN
2.0000 mg | Freq: Once | INTRAMUSCULAR | Status: AC
  Administered 2017-10-28: 2 mg via INTRAVENOUS

## 2017-10-28 MED ORDER — ENSURE ENLIVE PO LIQD
237.0000 mL | Freq: Three times a day (TID) | ORAL | Status: DC
  Administered 2017-10-29 – 2017-11-10 (×14): 237 mL via ORAL

## 2017-10-28 MED ORDER — LORAZEPAM 2 MG/ML IJ SOLN: 1.0000 mg | Freq: Once | INTRAMUSCULAR | Status: DC

## 2017-10-28 MED ORDER — HALOPERIDOL LACTATE 5 MG/ML IJ SOLN: 5.0000 mg | Freq: Four times a day (QID) | INTRAMUSCULAR | Status: DC | PRN

## 2017-10-28 MED ORDER — LORAZEPAM 2 MG/ML IJ SOLN
2.0000 mg | Freq: Once | INTRAMUSCULAR | Status: AC
  Administered 2017-10-28: 2 mg via INTRAVENOUS

## 2017-10-28 NOTE — Care Management Important Message (Signed)
Important Message  Patient Details  Name: [name] MRN: [id number] Date of Birth: 12/30/1955   Medicare Important Message Given:  Yes    Caren Macadam 10/28/2017, 1:48 PMImportant Message  Patient Details  Name: [name] MRN: [id number] Date of Birth: 1955/11/13   Medicare Important Message Given:  Yes    Caren Macadam 10/28/2017, 1:48 PM

## 2017-10-28 NOTE — Progress Notes (Signed)
Nutrition Follow-up  DOCUMENTATION CODES:   Not applicable  INTERVENTION:   Ensure Enlive po TID, each supplement provides 350 kcal and 20 grams of protein  NUTRITION DIAGNOSIS:   Inadequate oral intake related to lethargy/confusion, acute illness(alcohol withdrawal) as evidenced by meal completion < 25%.  Ongoing  GOAL:   Patient will meet greater than or equal to 90% of their needs  Not meeting  MONITOR:   Diet advancement, Weight trends, Labs, Other (Comment)(need for NGT replacement and TF re-start)  REASON FOR ASSESSMENT:   Consult Enteral/tube feeding initiation and management  ASSESSMENT:   62 y.o. male with medical history significant of MS, bipolar disorder, alcohol and tobacco abuse, presented to the emergency department complaining of abdominal pain with nausea and vomiting.  Patient is poor historian.  He reports to 2-3 days of abdominal pain associated with bloody nausea and vomiting.  Patient reported epigastric pain started approximately 2 weeks ago which has been worse with eating.    7/6- pt's diet advanced to DYS 3 with thin liquids  Pt still seems slightly confused but denies any issues with swallowing. Meal completions charted as 25-100% for his last 8 meals. Pt reports eating eggs, bacon, and grits this morning without complication. Discussed the importance of protein intake for preservation of lean body mass. Pt willing to try Ensure this stay.   Weight noted to decrease 2 lb since last RD visit 7/5 (157 lb to 155 lb). Will continue to monitor.   Pt had BM yesterday. Bowel regimen continues.   Medications reviewed and include: folic acid, MVI with minerals, thiamine Labs reviewed: Cl 115 (H)   Diet Order:   Diet Order           DIET DYS 3 Room service appropriate? Yes; Fluid consistency: Thin  Diet effective now         EDUCATION NEEDS:   No education needs have been identified at this time  Skin:  Skin Assessment: Skin Integrity  Issues: Skin Integrity Issues:: Incisions Incisions: R groin (7/1)  Last BM:  10/27/17  Height:   Ht Readings from Last 1 Encounters:  10/17/17 5\' 11"  (1.803 m)    Weight:   Wt Readings from Last 1 Encounters:  10/27/17 155 lb 10.3 oz (70.6 kg)    Ideal Body Weight:  78.18 kg  BMI:  Body mass index is 21.71 kg/m.  Estimated Nutritional Needs:   Kcal:  2000-2240 (25-28 kcal/kg)  Protein:  96-112 grams (1.2-1.4 grams/kg)  Fluid:  >/= 2 L/day    Vanessa Kick RD, LDN Clinical Nutrition Pager # - [phone]

## 2017-10-28 NOTE — Evaluation (Signed)
Occupational Therapy Evaluation Patient Details Name: [name] MRN: [id number] DOB: 1956/01/05 Today's Date: 10/28/2017    History of Present Illness [name] is a 62 y.o. male with medical history significant of MS (follows with Dr. Epimenio Foot), bipolar disorder, alcohol and tobacco abuse, presented to the emergency department complaining of abdominal pain with nausea and vomiting.  Patient is poor historian.  He reports to 2-3 days of abdominal pain associated with nausea and vomiting.  Patient reported epigastric pain started approximately 2 weeks ago which has been worse with eating.  Denies diarrhea, constipation and fever or chills.  Last alcohol use 3 days ago.  Normally drinks occasionally but can drink up to 12 pack a day.  Other associated symptoms include weakness/faint.  Denies NSAID/ASA use, no history of GI bleeding in the past.  Patient denies dark stools.  Patient underwent EGD which revealed large, deep duodenal bulb ulcer.  Patient underwent empiric embolization by IR on 7/1.  Hospitalization further complicated by altered mental status.   Clinical Impression   Pt admitted with above medical issues. Pt currently with functional limitations due to the deficits listed below (see OT Problem List).  Pt will benefit from skilled OT to increase their safety and independence with ADL and functional mobility for ADL to facilitate discharge to venue listed below.      Follow Up Recommendations  SNF    Equipment Recommendations  None recommended by OT    Recommendations for Other Services       Precautions / Restrictions Precautions Precautions: Fall      Mobility Bed Mobility Overal bed mobility: Needs Assistance Bed Mobility: Sit to Supine;Sit to Sidelying       Sit to supine: Min assist;HOB elevated Sit to sidelying: Mod assist;HOB elevated    Transfers                 General transfer comment: did not perform    Balance Overall balance assessment:  History of Falls;Needs assistance Sitting-balance support: Single extremity supported Sitting balance-Leahy Scale: Fair                                     ADL either performed or assessed with clinical judgement   ADL Overall ADL's : Needs assistance/impaired Eating/Feeding: Sitting;Minimal assistance   Grooming: Minimal assistance;Sitting   Upper Body Bathing: Minimal assistance;Sitting                             General ADL Comments: did not stand.  Pts BP 88/61. Pt was complaining of some dizziness but also stated he normally had a low BP     Vision Patient Visual Report: No change from baseline Additional Comments: wears sunglasses inside            Pertinent Vitals/Pain Pain Assessment: 0-10 Pain Score: 4  Pain Location: abdomen Pain Descriptors / Indicators: Discomfort Pain Intervention(s): Limited activity within patient's tolerance;Repositioned     Hand Dominance     Extremity/Trunk Assessment Upper Extremity Assessment Upper Extremity Assessment: Generalized weakness           Communication     Cognition Arousal/Alertness: Awake/alert Behavior During Therapy: Flat affect Overall Cognitive Status: No family/caregiver present to determine baseline cognitive functioning  Home Living Family/patient expects to be discharged to:: Private residence Living Arrangements: Other (Comment)   Type of Home: Mobile home Home Access: Stairs to enter     Home Layout: One level     Bathroom Shower/Tub: Tub/shower unit         Home Equipment: Environmental consultant - 2 wheels;Cane - single point          Prior Functioning/Environment Level of Independence: Independent                 OT Problem List: Decreased strength;Decreased activity tolerance;Impaired balance (sitting and/or standing);Decreased coordination;Decreased knowledge of use of DME or AE;Decreased  knowledge of precautions;Decreased safety awareness      OT Treatment/Interventions: Self-care/ADL training;Patient/family education;DME and/or AE instruction    OT Goals(Current goals can be found in the care plan section) Acute Rehab OT Goals Patient Stated Goal: get well- i cant go home like this OT Goal Formulation: With patient Time For Goal Achievement: 11/11/17 Potential to Achieve Goals: Good  OT Frequency: Min 2X/week   Barriers to D/C: Decreased caregiver support             AM-PAC PT "6 Clicks" Daily Activity     Outcome Measure Help from another person eating meals?: A Little Help from another person taking care of personal grooming?: A Little Help from another person toileting, which includes using toliet, bedpan, or urinal?: Total Help from another person bathing (including washing, rinsing, drying)?: A Lot Help from another person to put on and taking off regular upper body clothing?: A Lot Help from another person to put on and taking off regular lower body clothing?: Total 6 Click Score: 12   End of Session Nurse Communication: Mobility status  Activity Tolerance: Other (comment)(limited by low BP) Patient left: in bed;with bed alarm set;Other (comment)(tele sitter)  OT Visit Diagnosis: Unsteadiness on feet (R26.81);Repeated falls (R29.6);History of falling (Z91.81);Other abnormalities of gait and mobility (R26.89);Muscle weakness (generalized) (M62.81)                Time: 1150-1210 OT Time Calculation (min): 20 min Charges:  OT General Charges $OT Visit: 1 Visit OT Evaluation $OT Eval Moderate Complexity: 1 Mod G-Codes:     Lise Auer, [place] [phone]  Einar Crow D 10/28/2017, 12:37 PM

## 2017-10-28 NOTE — Progress Notes (Signed)
Patient agitated combative, with staff, pulling at condom cathteter, and attempting to hit and kick staff.  Initially patient required security and gpd, to get back into bed.  Patient still was combative and agitated after haldol and ativan given.  Dr. Alvino Chapel paged, and received order for Ativan.  After Ativan, patient had somewhat slowed down, but was still being combative with staff, and interfering with provided medical care.  Dr. Alvino Chapel paged, and order received for extra dose of haldol and soft waist belt restraint.

## 2017-10-28 NOTE — Progress Notes (Signed)
PROGRESS NOTE    [name]  WUJ:[id number] DOB: [birth year] DOA: 10/16/2017 PCP: Rinaldo Cloud, MD     Brief Narrative:  [name] a 62 y.o.malewith medical history significant ofMS (follows with Dr. Rogene [place] disorder, alcohol and tobacco abuse, presented to the emergency department complaining of abdominal pain with nausea and vomiting. Patient is poor historian. He reports to 2-3 days of abdominal pain associated with nausea and vomiting. Patient reported epigastric pain started approximately 2 weeks ago which has been worse with eating. Denies diarrhea, constipation and fever or chills. Last alcohol use 3 days ago. Normally drinks occasionally but can drink up to 12 pack a day. Other associated symptoms include weakness/faint.Denies NSAID/ASA use, no history of GI bleeding in the past. Patient denies dark stools.  Patient underwent EGD which revealed large, deep duodenal bulb ulcer.  Patient underwent empiric embolization by IR on 7/1.  Hospitalization further complicated by altered mental status.  New events last 24 hours / Subjective: No new issues today.  He is alert and oriented to self and place.  Has no other complaints other than the fact that he spilled food on his gown and requesting to get cleaned up.  Assessment & Plan:   Principal Problem:   Symptomatic anemia Active Problems:   MS (multiple sclerosis) (HCC)   Bipolar affective disorder, depressed, severe (HCC)   Bipolar I disorder (HCC)   Upper GI bleeding   GI bleed   Acute encephalopathy   Goals of care, counseling/discussion   Palliative care encounter   Symptomatic anemia/ Upper GI bleed -Presented with weakness, palpitations and soft blood pressure -Chronic use of alcohol; denies use of NSAIDs -Hemoglobin approximately 14-15, on admission was 10.1 with positive FOBT -Was on Protonix drip for 48 hours, and transitioned to BID.  -Anemia panel showed an iron of 77, ferritin of 122,  vitamin B12 180, folate 11.4 -Given that vitamin B12 is on the low normal side, dose of B12 given -Gastroenterology consulted and appreciated, status post EGD 6/27: Showed very large, deep and created duodenal bulb/duodenal sweep ulcer with pigmented spots.  LA grade D esophagitis -S/p empiric embolization of GDA on 10/21/2017 by IR  -No further bleeding episodes noted. He had low BP and low Hgb on 7/4. He was given 2u pRBC with improvement. Hgb remains stable.  -Need repeat EGD in 8 weeks to document healing of esophagitis as well as follow-up on large duodenal bulb ulcer. Avoid NSAIDS and alcohol. Follow up with GI in 6 weeks. Will need PPI 40mg  BID for 8 weeks then 40mg  QD indefinitely  Acute metabolic encephalopathy -Unclear etiology, and chart review, it appears that patient started to decline around 7/1.  Previously, patient was alert and oriented x3.  There was concern for alcohol withdrawal contributing to his encephalopathy.  I discussed with his ex-wife, at baseline, patient is alert and oriented, independent of daily activities although has been declining. -Stable   Hypernatremia -Resolved   Acute kidney injury  -Baseline creatinine approximately 0.9-1 -Resolved   Multiple sclerosis -Neurology, Dr. Epimenio Foot -Stable, continue tecfidera if able to take PO. Patient needs to bring in home supply   Bipolar disorder -Continue Abilify, Wellbutrin, Lexapro, lithium. Ex-wife reports patient has not been on lithium in over a month, unsure about other 3 medications if he has been taking it on a regular basis.  Patient's last dose of these medications were 6/30.  I will discontinue these for now until mentation improves  Alcohol abuse with acute delirium -Last  alcoholic drink was over a week ago   DVT prophylaxis: SCD Code Status: Full Family Communication: No family at bedside today  Disposition Plan: Need PT OT evaluation for disposition    Consultants:   GI  IR  Procedures:     EGD  Empiric embolization of GDA   Antimicrobials:  Anti-infectives (From admission, onward)   None       Objective: Vitals:   10/27/17 1347 10/27/17 1447 10/27/17 2000 10/28/17 0526  BP:  (!) 92/59 92/63 (!) 90/59  Pulse:  68 77 88  Resp:  16 15 (!) 22  Temp:  98.5 F (36.9 C) 98.9 F (37.2 C) 98.7 F (37.1 C)  TempSrc:  Oral Oral   SpO2:  97% 99% 95%  Weight: 70.6 kg (155 lb 10.3 oz)     Height:        Intake/Output Summary (Last 24 hours) at 10/28/2017 1051 Last data filed at 10/28/2017 1610 Gross per 24 hour  Intake -  Output 2050 ml  Net -2050 ml   Filed Weights   10/26/17 0021 10/26/17 0448 10/27/17 1347  Weight: 71.3 kg (157 lb 3 oz) 71.3 kg (157 lb 3 oz) 70.6 kg (155 lb 10.3 oz)    Examination: General exam: Appears calm and comfortable  Respiratory system: Clear to auscultation. Respiratory effort normal. Cardiovascular system: S1 & S2 heard, RRR. No JVD, murmurs, rubs, gallops or clicks. No pedal edema. Gastrointestinal system: Abdomen is nondistended, soft and nontender. No organomegaly or masses felt. Normal bowel sounds heard. Central nervous system: Alert and oriented to self and place only.  Extremities: Symmetric 5 x 5 power. Skin: No rashes, lesions or ulcers Psychiatry: Judgement and insight appear poor   Data Reviewed: I have personally reviewed following labs and imaging studies  CBC: Recent Labs  Lab 10/24/17 0324  10/24/17 1838 10/25/17 0403 10/26/17 0511 10/27/17 0451 10/28/17 0532  WBC 5.5  --   --  4.6 6.6 4.9 4.6  HGB 7.6*   < > 10.0* 10.3* 10.5* 10.8* 10.3*  HCT 24.1*   < > 29.8* 30.9* 32.0* 33.1* 31.5*  MCV 100.0  --   --  94.5 96.1 97.1 96.6  PLT 289  --   --  281 286 296 296   < > = values in this interval not displayed.   Basic Metabolic Panel: Recent Labs  Lab 10/24/17 0324 10/25/17 0403 10/26/17 0511 10/27/17 0451 10/28/17 0532  NA 146* 141 145 142 145  K 3.3* 3.8 3.6 4.0 4.1  CL 119* 116* 117* 115* 115*   CO2 22 22 22  20* 22  GLUCOSE 110* 101* 93 87 100*  BUN 21 16 12 10 14   CREATININE 1.09 0.97 0.98 0.99 0.96  CALCIUM 8.0* 8.0* 8.1* 8.0* 8.4*  MG 2.0 2.1 2.1 2.1 2.1  PHOS 3.1 3.2 3.4 3.0 3.5   GFR: Estimated Creatinine Clearance: 79.7 mL/min (by C-G formula based on SCr of 0.96 mg/dL). Liver Function Tests: No results for input(s): AST, ALT, ALKPHOS, BILITOT, PROT, ALBUMIN in the last 168 hours. No results for input(s): LIPASE, AMYLASE in the last 168 hours. No results for input(s): AMMONIA in the last 168 hours. Coagulation Profile: No results for input(s): INR, PROTIME in the last 168 hours. Cardiac Enzymes: No results for input(s): CKTOTAL, CKMB, CKMBINDEX, TROPONINI in the last 168 hours. BNP (last 3 results) No results for input(s): PROBNP in the last 8760 hours. HbA1C: No results for input(s): HGBA1C in the last 72 hours. CBG: Recent  Labs  Lab 10/25/17 2027 10/26/17 0012 10/26/17 0446 10/26/17 1556 10/27/17 2034  GLUCAP 81 74 86 94 121*   Lipid Profile: No results for input(s): CHOL, HDL, LDLCALC, TRIG, CHOLHDL, LDLDIRECT in the last 72 hours. Thyroid Function Tests: No results for input(s): TSH, T4TOTAL, FREET4, T3FREE, THYROIDAB in the last 72 hours. Anemia Panel: No results for input(s): VITAMINB12, FOLATE, FERRITIN, TIBC, IRON, RETICCTPCT in the last 72 hours. Sepsis Labs: No results for input(s): PROCALCITON, LATICACIDVEN in the last 168 hours.  No results found for this or any previous visit (from the past 240 hour(s)).     Radiology Studies: No results found.    Scheduled Meds: . Dimethyl Fumarate  240 mg Oral BID  . folic acid  1 mg Oral Daily  . mouth rinse  15 mL Mouth Rinse BID  . multivitamin with minerals  1 tablet Oral Daily  . pantoprazole  40 mg Oral BID AC  . polyethylene glycol  17 g Oral Daily  . QUEtiapine  25 mg Oral QHS  . senna-docusate  1 tablet Oral QHS  . sodium chloride flush  3 mL Intravenous Q12H  . thiamine  100 mg Oral  Daily   Continuous Infusions:    LOS: 10 days    Time spent: 20 minutes   Noralee Stain, DO [facility] [email or web address] Password [email or web address] 10/28/2017, 10:51 AM

## 2017-10-29 MED ORDER — LORAZEPAM 2 MG/ML IJ SOLN
2.0000 mg | INTRAMUSCULAR | Status: DC | PRN
  Administered 2017-10-29 – 2017-10-31 (×8): 2 mg via INTRAMUSCULAR
  Filled 2017-10-29 (×8): qty 1

## 2017-10-29 MED ORDER — HALOPERIDOL LACTATE 5 MG/ML IJ SOLN
5.0000 mg | Freq: Four times a day (QID) | INTRAMUSCULAR | Status: DC | PRN
  Administered 2017-10-29 – 2017-11-01 (×8): 5 mg via INTRAMUSCULAR
  Filled 2017-10-29 (×8): qty 1

## 2017-10-29 NOTE — Progress Notes (Signed)
Patient was very agitated and combative, starting to hit staff and became more violent. Notified MD, security. Soft restraints ankles and wrists was ordered and applied at 1629. Will continue to monitor patient.

## 2017-10-29 NOTE — Progress Notes (Signed)
Occupational Therapy Treatment Patient Details Name: [name] MRN: [id number] DOB: 06-07-1955 Today's Date: 10/29/2017    History of present illness [name] is a 62 y.o. male with medical history significant of MS (follows with Dr. Epimenio Foot), bipolar disorder, alcohol and tobacco abuse, presented to the emergency department complaining of abdominal pain with nausea and vomiting.  Patient is poor historian.  He reports to 2-3 days of abdominal pain associated with nausea and vomiting.  Patient reported epigastric pain started approximately 2 weeks ago which has been worse with eating.  Denies diarrhea, constipation and fever or chills.  Last alcohol use 3 days ago.  Normally drinks occasionally but can drink up to 12 pack a day.  Other associated symptoms include weakness/faint.  Denies NSAID/ASA use, no history of GI bleeding in the past.  Patient denies dark stools.  Patient underwent EGD which revealed large, deep duodenal bulb ulcer.  Patient underwent empiric embolization by IR on 7/1.  Hospitalization further complicated by altered mental status.   OT comments  Pt with increased participation  Follow Up Recommendations  SNF    Equipment Recommendations  None recommended by OT    Recommendations for Other Services      Precautions / Restrictions Precautions Precautions: Fall       Mobility Bed Mobility               General bed mobility comments: pt OOB with PT  Transfers Overall transfer level: Needs assistance Equipment used: Rolling walker (2 wheeled) Transfers: Sit to/from [employer] Sit to Stand: Mod assist;+2 physical assistance;+2 safety/equipment Stand pivot transfers: Mod assist;+2 physical assistance;+2 safety/equipment            Balance Overall balance assessment: History of Falls;Needs assistance Sitting-balance support: Single extremity supported Sitting balance-Leahy Scale: Fair     Standing balance support: Bilateral upper  extremity supported Standing balance-Leahy Scale: Poor                             ADL either performed or assessed with clinical judgement   ADL               Lower Body Bathing: Moderate assistance;Sit to/from stand;Cueing for sequencing;Cueing for safety       Lower Body Dressing: Moderate assistance;Sit to/from stand;Cueing for sequencing;Cueing for safety   Toilet Transfer: Moderate assistance;Ambulation;RW   Toileting- Clothing Manipulation and Hygiene: Moderate assistance;Sit to/from stand;Cueing for sequencing;Cueing for safety         General ADL Comments: pt with increased I this day. Pt did walk to bathroom. Pt  stood to urinate and had trouble aiming     Vision Patient Visual Report: No change from baseline Additional Comments: wears sunglasses inside   Perception     Praxis      Cognition Arousal/Alertness: Awake/alert Behavior During Therapy: Impulsive Overall Cognitive Status: No family/caregiver present to determine baseline cognitive functioning                                                     Pertinent Vitals/ Pain       Pain Assessment: No/denies pain         Frequency  Min 2X/week        Progress Toward Goals  OT Goals(current goals can  now be found in the care plan section)  Progress towards OT goals: Progressing toward goals     Plan Discharge plan remains appropriate    Co-evaluation                 AM-PAC PT "6 Clicks" Daily Activity     Outcome Measure   Help from another person eating meals?: A Little Help from another person taking care of personal grooming?: A Little Help from another person toileting, which includes using toliet, bedpan, or urinal?: Total Help from another person bathing (including washing, rinsing, drying)?: A Lot Help from another person to put on and taking off regular upper body clothing?: A Lot Help from another person to put on and taking off  regular lower body clothing?: Total 6 Click Score: 12    End of Session Equipment Utilized During Treatment: Rolling walker  OT Visit Diagnosis: Unsteadiness on feet (R26.81);Repeated falls (R29.6);History of falling (Z91.81);Other abnormalities of gait and mobility (R26.89);Muscle weakness (generalized) (M62.81)   Activity Tolerance Other (comment);Patient tolerated treatment well(limited by low BP)   Patient Left in bed;with bed alarm set;Other (comment)(tele sitter)   Nurse Communication Mobility status        Time: [phone] OT Time Calculation (min): 20 min  Charges: OT General Charges $OT Visit: 1 Visit OT Treatments $Self Care/Home Management : 8-22 mins  [place], [place] [phone]   Alba Cory 10/29/2017, 1:29 PM

## 2017-10-29 NOTE — Evaluation (Signed)
Physical Therapy Evaluation Patient Details Name: [name] MRN: [id number] DOB: [birth year] Today's Date: 10/29/2017   History of Present Illness  EMARION TORAL is a 62 y.o. male with medical history significant of MS (follows with Dr. Epimenio Foot), bipolar disorder, alcohol and tobacco abuse, presented to the emergency department complaining of abdominal pain with nausea and vomiting.  Patient is poor historian.  He reports to 2-3 days of abdominal pain associated with nausea and vomiting.  Patient reported epigastric pain started approximately 2 weeks ago which has been worse with eating.  Denies diarrhea, constipation and fever or chills.  Last alcohol use 3 days ago.  Normally drinks occasionally but can drink up to 12 pack a day.  Other associated symptoms include weakness/faint.  Denies NSAID/ASA use, no history of GI bleeding in the past.  Patient denies dark stools.  Patient underwent EGD which revealed large, deep duodenal bulb ulcer.  Patient underwent empiric embolization by IR on 7/1.  Hospitalization further complicated by altered mental status.  Clinical Impression  Pt admitted with above diagnosis. Pt currently with functional limitations due to the deficits listed below (see PT Problem List). Pt cooperative with PT, able to amb today; will benefit from PT in acute setting; Pt will benefit from skilled PT to increase their independence and safety with mobility to allow discharge to the venue listed below.     Follow Up Recommendations SNF    Equipment Recommendations  None recommended by PT    Recommendations for Other Services       Precautions / Restrictions Precautions Precautions: Fall      Mobility  Bed Mobility Overal bed mobility: Needs Assistance Bed Mobility: Supine to Sit     Supine to sit: Min guard;HOB elevated Sit to supine: Min guard   General bed mobility comments: for safety  Transfers Overall transfer level: Needs assistance Equipment used: Rolling  walker (2 wheeled) Transfers: Sit to/from [employer] Sit to Stand: Min assist;+2 safety/equipment;Mod assist Stand pivot transfers: Min assist       General transfer comment: cues for hand placement and safety  Ambulation/Gait Ambulation/Gait assistance: Min assist Gait Distance (Feet): 10 Feet Assistive device: Rolling walker (2 wheeled) Gait Pattern/deviations: Step-through pattern;Narrow base of support;Decreased stride length     General Gait Details: cues for RW safety  Stairs            Wheelchair Mobility    Modified Rankin (Stroke Patients Only)       Balance Overall balance assessment: History of Falls;Needs assistance Sitting-balance support: Single extremity supported Sitting balance-Leahy Scale: Fair     Standing balance support: Bilateral upper extremity supported;Single extremity supported Standing balance-Leahy Scale: Poor Standing balance comment: reliant on UEs and  external support                             Pertinent Vitals/Pain Pain Assessment: No/denies pain    Home Living Family/patient expects to be discharged to:: Private residence Living Arrangements: Other (Comment)   Type of Home: Mobile home Home Access: Stairs to enter   Entrance Stairs-Number of Steps: unsure Home Layout: One level Home Equipment: Walker - 2 wheels;Cane - single point      Prior Function Level of Independence: Independent               Hand Dominance        Extremity/Trunk Assessment   Upper Extremity Assessment Upper Extremity Assessment: Generalized weakness  Lower Extremity Assessment Lower Extremity Assessment: Generalized weakness       Communication   Communication: No difficulties  Cognition Arousal/Alertness: Awake/alert Behavior During Therapy: Impulsive Overall Cognitive Status: No family/caregiver present to determine baseline cognitive functioning                                  General Comments: mildly impulsive but easily redirected verbally for safe techniques      General Comments      Exercises     Assessment/Plan    PT Assessment Patient needs continued PT services  PT Problem List Decreased strength;Decreased mobility;Decreased range of motion;Decreased balance;Decreased activity tolerance;Decreased knowledge of use of DME       PT Treatment Interventions DME instruction;Gait training;Therapeutic activities;Therapeutic exercise;Patient/family education;Functional mobility training;Balance training    PT Goals (Current goals can be found in the Care Plan section)  Acute Rehab PT Goals Patient Stated Goal: get well- i cant go home like this PT Goal Formulation: With patient Time For Goal Achievement: 11/12/17 Potential to Achieve Goals: Good    Frequency Min 3X/week   Barriers to discharge        Co-evaluation               AM-PAC PT "6 Clicks" Daily Activity  Outcome Measure Difficulty turning over in bed (including adjusting bedclothes, sheets and blankets)?: A Lot Difficulty moving from lying on back to sitting on the side of the bed? : A Little Difficulty sitting down on and standing up from a chair with arms (e.g., wheelchair, bedside commode, etc,.)?: A Lot Help needed moving to and from a bed to chair (including a wheelchair)?: A Lot Help needed walking in hospital room?: A Lot Help needed climbing 3-5 steps with a railing? : A Lot 6 Click Score: 13    End of Session Equipment Utilized During Treatment: Gait belt Activity Tolerance: Patient tolerated treatment well Patient left: in bed;with bed alarm set;with call bell/phone within reach   PT Visit Diagnosis: Difficulty in walking, not elsewhere classified (R26.2);Unsteadiness on feet (R26.81)    Time: 1127-1200 PT Time Calculation (min) (ACUTE ONLY): 33 min   Charges:   PT Evaluation $PT Eval Low Complexity: 1 Low PT Treatments $Gait Training: 8-22 mins   PT  G CodesDrucilla Chalet, PT Pager: [phone] 10/29/2017   [facility] 10/29/2017, 3:44 PM

## 2017-10-29 NOTE — Progress Notes (Signed)
SLP Cancellation Note  Patient Details Name: [name] MRN: [id number] DOB: [birth year]   Cancelled treatment:       Reason Eval/Treat Not Completed: Other (comment);Fatigue/lethargy limiting ability to participate(rn had to give pt medication due to agitation, will continue efforts)   Mills Koller, MS [facility] SLP [phone]
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
## 2017-10-29 NOTE — Progress Notes (Signed)
LCSW consulted for SNF placement.  LCSW unable to assess patient. Patient is sedated.   OT recommending SNF for patient. Patient has [facility] and requires pre auth.  Patient will need a PT evaluation. PT has attempted to work with patient since admission.   LCSW will continue to follow for dc needs.   Beulah Gandy [facility] CSW [phone]

## 2017-10-29 NOTE — Progress Notes (Signed)
PROGRESS NOTE    [name]  ZOX:[id number] DOB: Apr 15, 1956 DOA: 10/16/2017 PCP: Rinaldo Cloud, MD     Brief Narrative:  [name] a 62 y.o.malewith medical history significant ofMS (follows with Dr. Rogene [place] disorder, alcohol and tobacco abuse, presented to the emergency department complaining of abdominal pain with nausea and vomiting. Patient is poor historian. He reports to 2-3 days of abdominal pain associated with nausea and vomiting. Patient reported epigastric pain started approximately 2 weeks ago which has been worse with eating. Denies diarrhea, constipation and fever or chills. Last alcohol use 3 days ago. Normally drinks occasionally but can drink up to 12 pack a day. Other associated symptoms include weakness/faint.Denies NSAID/ASA use, no history of GI bleeding in the past. Patient denies dark stools.  Patient underwent EGD which revealed large, deep duodenal bulb ulcer.  Patient underwent empiric embolization by IR on 7/1.  Hospitalization further complicated by altered mental status.  New events last 24 hours / Subjective: He has had some agitation yesterday. This morning, he tells his nurse to "leave me alone." He is wondering when he can leave the hospital.   Assessment & Plan:   Principal Problem:   Symptomatic anemia Active Problems:   MS (multiple sclerosis) (HCC)   Bipolar affective disorder, depressed, severe (HCC)   Bipolar I disorder (HCC)   Upper GI bleeding   GI bleed   Acute encephalopathy   Goals of care, counseling/discussion   Palliative care encounter   Symptomatic anemia/ Upper GI bleed -Presented with weakness, palpitations and soft blood pressure -Chronic use of alcohol; denies use of NSAIDs -Hemoglobin approximately 14-15, on admission was 10.1 with positive FOBT -Was on Protonix drip for 48 hours, and transitioned to BID.  -Anemia panel showed an iron of 77, ferritin of 122, vitamin B12 180, folate 11.4 -Given that  vitamin B12 is on the low normal side, dose of B12 given -Gastroenterology consulted and appreciated, status post EGD 6/27: Showed very large, deep and created duodenal bulb/duodenal sweep ulcer with pigmented spots.  LA grade D esophagitis -S/p empiric embolization of GDA on 10/21/2017 by IR  -No further bleeding episodes noted. He had low BP and low Hgb on 7/4. He was given 2u pRBC with improvement. Hgb remains stable.  -Need repeat EGD in 8 weeks to document healing of esophagitis as well as follow-up on large duodenal bulb ulcer. Avoid NSAIDS and alcohol. Follow up with GI in 6 weeks. Will need PPI 40mg  BID for 8 weeks then 40mg  QD indefinitely  Acute metabolic encephalopathy -Unclear etiology, and chart review, it appears that patient started to decline around 7/1.  Previously, patient was alert and oriented x3.  There was concern for alcohol withdrawal contributing to his encephalopathy.  I discussed with his ex-wife, at baseline, patient is alert and oriented, independent of daily activities although has been declining. -Waxing and waning   Hypernatremia -Resolved   Acute kidney injury  -Baseline creatinine approximately 0.9-1 -Resolved   Multiple sclerosis -Neurology, Dr. Epimenio Foot -Stable, continue tecfidera if able to take PO. Patient needs to bring in home supply   Bipolar disorder -Was resumed on Abilify, Wellbutrin, Lexapro, lithium. Ex-wife reports patient has not been on lithium in over a month, unsure about other 3 medications if he has been taking it on a regular basis.  Patient's last dose of these medications were 6/30.  I will discontinue these for now   Alcohol abuse with acute delirium -Last alcoholic drink was over a week ago  DVT prophylaxis: SCD Code Status: Full Family Communication: No family at bedside today  Disposition Plan: Need SNF once his mentation is stable    Consultants:   GI  IR  Procedures:   EGD  Empiric embolization of GDA    Antimicrobials:  Anti-infectives (From admission, onward)   None       Objective: Vitals:   10/27/17 1447 10/27/17 2000 10/28/17 0526 10/29/17 0620  BP: (!) 92/59 92/63 (!) 90/59 119/83  Pulse: 68 77 88 82  Resp: 16 15 (!) 22 20  Temp: 98.5 F (36.9 C) 98.9 F (37.2 C) 98.7 F (37.1 C) 98.3 F (36.8 C)  TempSrc: Oral Oral  Oral  SpO2: 97% 99% 95% 98%  Weight:      Height:       No intake or output data in the 24 hours ending 10/29/17 1023 Filed Weights   10/26/17 0021 10/26/17 0448 10/27/17 1347  Weight: 71.3 kg (157 lb 3 oz) 71.3 kg (157 lb 3 oz) 70.6 kg (155 lb 10.3 oz)    Examination: General exam: Appears calm and comfortable  Respiratory system: Clear to auscultation. Respiratory effort normal. Cardiovascular system: S1 & S2 heard, RRR. No JVD, murmurs, rubs, gallops or clicks. No pedal edema. Gastrointestinal system: Abdomen is nondistended, soft and nontender. No organomegaly or masses felt. Normal bowel sounds heard. Central nervous system: Alert, nonfocal  Extremities: Symmetric 5 x 5 power. Skin: No rashes, lesions or ulcers Psychiatry: Judgement and insight appear poor, still intermittent confusion and agitation     Data Reviewed: I have personally reviewed following labs and imaging studies  CBC: Recent Labs  Lab 10/24/17 0324  10/24/17 1838 10/25/17 0403 10/26/17 0511 10/27/17 0451 10/28/17 0532  WBC 5.5  --   --  4.6 6.6 4.9 4.6  HGB 7.6*   < > 10.0* 10.3* 10.5* 10.8* 10.3*  HCT 24.1*   < > 29.8* 30.9* 32.0* 33.1* 31.5*  MCV 100.0  --   --  94.5 96.1 97.1 96.6  PLT 289  --   --  281 286 296 296   < > = values in this interval not displayed.   Basic Metabolic Panel: Recent Labs  Lab 10/24/17 0324 10/25/17 0403 10/26/17 0511 10/27/17 0451 10/28/17 0532  NA 146* 141 145 142 145  K 3.3* 3.8 3.6 4.0 4.1  CL 119* 116* 117* 115* 115*  CO2 22 22 22  20* 22  GLUCOSE 110* 101* 93 87 100*  BUN 21 16 12 10 14   CREATININE 1.09 0.97 0.98  0.99 0.96  CALCIUM 8.0* 8.0* 8.1* 8.0* 8.4*  MG 2.0 2.1 2.1 2.1 2.1  PHOS 3.1 3.2 3.4 3.0 3.5   GFR: Estimated Creatinine Clearance: 79.7 mL/min (by C-G formula based on SCr of 0.96 mg/dL). Liver Function Tests: No results for input(s): AST, ALT, ALKPHOS, BILITOT, PROT, ALBUMIN in the last 168 hours. No results for input(s): LIPASE, AMYLASE in the last 168 hours. No results for input(s): AMMONIA in the last 168 hours. Coagulation Profile: No results for input(s): INR, PROTIME in the last 168 hours. Cardiac Enzymes: No results for input(s): CKTOTAL, CKMB, CKMBINDEX, TROPONINI in the last 168 hours. BNP (last 3 results) No results for input(s): PROBNP in the last 8760 hours. HbA1C: No results for input(s): HGBA1C in the last 72 hours. CBG: Recent Labs  Lab 10/25/17 2027 10/26/17 0012 10/26/17 0446 10/26/17 1556 10/27/17 2034  GLUCAP 81 74 86 94 121*   Lipid Profile: No results for input(s): CHOL, HDL,  LDLCALC, TRIG, CHOLHDL, LDLDIRECT in the last 72 hours. Thyroid Function Tests: No results for input(s): TSH, T4TOTAL, FREET4, T3FREE, THYROIDAB in the last 72 hours. Anemia Panel: No results for input(s): VITAMINB12, FOLATE, FERRITIN, TIBC, IRON, RETICCTPCT in the last 72 hours. Sepsis Labs: No results for input(s): PROCALCITON, LATICACIDVEN in the last 168 hours.  No results found for this or any previous visit (from the past 240 hour(s)).     Radiology Studies: No results found.    Scheduled Meds: . Dimethyl Fumarate  240 mg Oral BID  . feeding supplement (ENSURE ENLIVE)  237 mL Oral TID BM  . folic acid  1 mg Oral Daily  . mouth rinse  15 mL Mouth Rinse BID  . multivitamin with minerals  1 tablet Oral Daily  . pantoprazole  40 mg Oral BID AC  . polyethylene glycol  17 g Oral Daily  . QUEtiapine  25 mg Oral QHS  . senna-docusate  1 tablet Oral QHS  . sodium chloride flush  3 mL Intravenous Q12H  . thiamine  100 mg Oral Daily   Continuous Infusions:    LOS:  11 days    Time spent: 20 minutes   Noralee Stain, DO [facility] [email or web address] Password [email or web address] 10/29/2017, 10:23 AM

## 2017-10-30 DIAGNOSIS — F314 Bipolar disorder, current episode depressed, severe, without psychotic features: Secondary | ICD-10-CM

## 2017-10-30 LAB — CBC
HCT: 32 % — ABNORMAL LOW (ref 39.0–52.0)
Hemoglobin: 10.4 g/dL — ABNORMAL LOW (ref 13.0–17.0)
MCH: 31.2 pg (ref 26.0–34.0)
MCHC: 32.5 g/dL (ref 30.0–36.0)
MCV: 96.1 fL (ref 78.0–100.0)
Platelets: 359 10*3/uL (ref 150–400)
RBC: 3.33 MIL/uL — ABNORMAL LOW (ref 4.22–5.81)
RDW: 14.8 % (ref 11.5–15.5)
WBC: 3.5 10*3/uL — ABNORMAL LOW (ref 4.0–10.5)

## 2017-10-30 LAB — BASIC METABOLIC PANEL
Anion gap: 7 (ref 5–15)
BUN: 15 mg/dL (ref 8–23)
CO2: 25 mmol/L (ref 22–32)
Calcium: 8.7 mg/dL — ABNORMAL LOW (ref 8.9–10.3)
Chloride: 109 mmol/L (ref 98–111)
Creatinine, Ser: 1.01 mg/dL (ref 0.61–1.24)
GFR calc Af Amer: >60 mL/min (ref >60)
GFR calc non Af Amer: >60 mL/min (ref >60)
Glucose, Bld: 113 mg/dL — ABNORMAL HIGH (ref 70–99)
Potassium: 3.9 mmol/L (ref 3.5–5.1)
Sodium: 141 mmol/L (ref 135–145)

## 2017-10-30 NOTE — Progress Notes (Addendum)
  Speech Language Pathology Treatment: Dysphagia  Patient Details Name: [name] MRN: [id number] DOB: [birth year] Today's Date: 10/30/2017 Time: 0802-2336 SLP Time Calculation (min) (ACUTE ONLY): 15 min  Assessment / Plan / Recommendation Clinical Impression  Pt was seen at bedside following BSE completed 10/26/17. RN present initially, and reported pt tolerated crushed meds with puree without difficulty. Pt appeared lethargic, kept eyes closed until RN placed sunglasses on pt. SLP attempted trials of thin liquid and graham cracker. Pt refused trials of cracker and ginger ale, but accepted sips of water via straw, and appeared to tolerate this without overt s/s aspiration. No temp spikes, and lungs are reported to be diminished. Safe swallow precautions posted at [facility]. ST will continue to follow to assess diet tolerance and provide education.    HPI HPI: 62 y.o. male with medical history significant of MS, bipolar disorder, alcohol and tobacco abuse, presented to the emergency department complaining of abdominal pain with nausea and vomiting.  Patient is poor historian.  He reports to 2-3 days of abdominal pain associated with bloody nausea and vomiting.  Patient reported epigastric pain started approximately 2 weeks ago which has been worse with eating.  Denies diarrhea, constipation and fever or chills.  Last alcohol use 3 days ago.  Normally drinks occasionally  but can drink up to 12 pack a day.  Other associated symptoms include weakness/faint.  Denies NSAID/ASA use, no history of GI bleeding in the past.  Patient denies dark stools      SLP Plan  Continue with current plan of care       Recommendations  Diet recommendations: Dysphagia 3 (mechanical soft);Thin liquid Liquids provided via: Straw;Cup Medication Administration: crushed meds with puree Supervision: Staff to assist with self feeding;Full supervision/cueing for compensatory strategies Compensations: Minimize environmental  distractions;Slow rate;Small sips/bites Postural Changes and/or Swallow Maneuvers: Seated upright 90 degrees;Upright 30-60 min after meal                Oral Care Recommendations: Oral care QID Follow up Recommendations: 24 hour supervision/assistance SLP Visit Diagnosis: Dysphagia, unspecified (R13.10) Plan: Continue with current plan of care       Celia B. Murvin Natal [facility], CCC-SLP Speech Language Pathologist [phone]  [name] 10/30/2017, 10:30 AM

## 2017-10-30 NOTE — Progress Notes (Signed)
Patient restraints removed to see how patient does. Patient is more alert and oriented than yesterday. Will continue to monitor and discuss plan of care with attending physician.

## 2017-10-30 NOTE — Progress Notes (Signed)
PROGRESS NOTE  [name] ZOX:[id number] DOB: 1955-12-05 DOA: 10/16/2017 PCP: Rinaldo Cloud, MD  HPI/Recap of past 54 hours: 62 year old male with medical history significant for MS, bipolar disorder, alcohol/tobacco abuse, presented to the ER complaining of abdominal pain, nausea/vomiting for the past 2 weeks.  Patient denied any NSAID/aspirin use, no history of GI bleeding in the past.  Patient underwent EGD which revealed a large, deep duodenal bulb ulcer.  Patient underwent empiric embolization by IR on 7/1.  Hospitalization further complicated by acute encephalopathy.  Today, patient noted to be sleepy, but easily arousable.  Patient knows where he is.  Patient denies any new complaints.  Assessment/Plan: Principal Problem:   Symptomatic anemia Active Problems:   MS (multiple sclerosis) (HCC)   Bipolar affective disorder, depressed, severe (HCC)   Bipolar I disorder (HCC)   Upper GI bleeding   GI bleed   Acute encephalopathy   Goals of care, counseling/discussion   Palliative care encounter  Upper GI bleed due to duodenal bulb ulcer Likely due to chronic alcohol abuse Positive FOBT, with hemoglobin drop GI consulted, status post EGD on 6/27 showed very large deep duodenal bulb ulcer, LA grade D esophagitis IR consulted, status post empiric embolization on 10/21/2017 Continue PPI 40 mg twice daily for 8 weeks, then 40 mg daily indefinitely Need repeat EGD in 8 weeks to document healing of esophagitis as well as large duodenal bulb ulcer.  Will up with GI in 6 weeks  Acute blood loss anemia Likely due to above Hemoglobin at baseline 14-15, 10.1 on admission Status post 2 units of packed RBC on 7/4 Anemia panel showed iron of 77, ferritin of 122, vitamin B12 180, folate 11.4 Vitamin B12 on the low normal side, status post 1 dose of B12 given Daily CBC  Acute metabolic encephalopathy Unclear etiology, waxing and waning ??  Alcohol withdrawal Noted to be combative, had to  be put on restraints Monitor closely, fall precautions  Multiple sclerosis Followed by neurology as an outpatient On home tecfidera, pharmacy does not stock medication, patient has to bring home meds  Bipolar disorder Unsure if patient is on any meds at home May consider consulting psychiatry  Alcohol abuse Last alcoholic drink was over 1 week ago Counseled against     Code Status: Full  Family Communication: None at bedside  Disposition Plan: SNF   Consultants:  GI  IR  Procedures:  EGD  Embolization by IR  Antimicrobials:  None  DVT prophylaxis: SCDs   Objective: Vitals:   10/29/17 1414 10/29/17 1954 10/29/17 2100 10/30/17 0417  BP: (!) 85/54 105/69  (!) 81/53  Pulse: 83 75  85  Resp: 18 (!) 22  (!) 22  Temp: 98.5 F (36.9 C) 98.2 F (36.8 C)  97.7 F (36.5 C)  TempSrc: Oral Oral  Oral  SpO2: 96% 98%  96%  Weight:   72.5 kg (159 lb 13.3 oz)   Height:        Intake/Output Summary (Last 24 hours) at 10/30/2017 1359 Last data filed at 10/30/2017 1020 Gross per 24 hour  Intake 600 ml  Output 1000 ml  Net -400 ml   Filed Weights   10/26/17 0448 10/27/17 1347 10/29/17 2100  Weight: 71.3 kg (157 lb 3 oz) 70.6 kg (155 lb 10.3 oz) 72.5 kg (159 lb 13.3 oz)    Exam:   General: NAD  Cardiovascular: S1, S2 present  Respiratory: CTA B  Abdomen: Soft, nontender, nondistended, bowel sounds present  Musculoskeletal: No  pedal edema bilaterally  Skin: Normal  Psychiatry: Currently calm, no agitation noted   Data Reviewed: CBC: Recent Labs  Lab 10/25/17 0403 10/26/17 0511 10/27/17 0451 10/28/17 0532 10/30/17 0607  WBC 4.6 6.6 4.9 4.6 3.5*  HGB 10.3* 10.5* 10.8* 10.3* 10.4*  HCT 30.9* 32.0* 33.1* 31.5* 32.0*  MCV 94.5 96.1 97.1 96.6 96.1  PLT 281 286 296 296 359   Basic Metabolic Panel: Recent Labs  Lab 10/24/17 0324 10/25/17 0403 10/26/17 0511 10/27/17 0451 10/28/17 0532 10/30/17 0607  NA 146* 141 145 142 145 141  K 3.3*  3.8 3.6 4.0 4.1 3.9  CL 119* 116* 117* 115* 115* 109  CO2 22 22 22  20* 22 25  GLUCOSE 110* 101* 93 87 100* 113*  BUN 21 16 12 10 14 15   CREATININE 1.09 0.97 0.98 0.99 0.96 1.01  CALCIUM 8.0* 8.0* 8.1* 8.0* 8.4* 8.7*  MG 2.0 2.1 2.1 2.1 2.1  --   PHOS 3.1 3.2 3.4 3.0 3.5  --    GFR: Estimated Creatinine Clearance: 77.8 mL/min (by C-G formula based on SCr of 1.01 mg/dL). Liver Function Tests: No results for input(s): AST, ALT, ALKPHOS, BILITOT, PROT, ALBUMIN in the last 168 hours. No results for input(s): LIPASE, AMYLASE in the last 168 hours. No results for input(s): AMMONIA in the last 168 hours. Coagulation Profile: No results for input(s): INR, PROTIME in the last 168 hours. Cardiac Enzymes: No results for input(s): CKTOTAL, CKMB, CKMBINDEX, TROPONINI in the last 168 hours. BNP (last 3 results) No results for input(s): PROBNP in the last 8760 hours. HbA1C: No results for input(s): HGBA1C in the last 72 hours. CBG: Recent Labs  Lab 10/25/17 2027 10/26/17 0012 10/26/17 0446 10/26/17 1556 10/27/17 2034  GLUCAP 81 74 86 94 121*   Lipid Profile: No results for input(s): CHOL, HDL, LDLCALC, TRIG, CHOLHDL, LDLDIRECT in the last 72 hours. Thyroid Function Tests: No results for input(s): TSH, T4TOTAL, FREET4, T3FREE, THYROIDAB in the last 72 hours. Anemia Panel: No results for input(s): VITAMINB12, FOLATE, FERRITIN, TIBC, IRON, RETICCTPCT in the last 72 hours. Urine analysis:    Component Value Date/Time   COLORURINE YELLOW 09/24/2016 0924   APPEARANCEUR CLEAR 09/24/2016 0924   LABSPEC 1.009 09/24/2016 0924   PHURINE 7.0 09/24/2016 0924   GLUCOSEU NEGATIVE 09/24/2016 0924   HGBUR NEGATIVE 09/24/2016 0924   BILIRUBINUR NEGATIVE 09/24/2016 0924   KETONESUR NEGATIVE 09/24/2016 0924   PROTEINUR NEGATIVE 09/24/2016 0924   UROBILINOGEN 0.2 07/08/2013 0930   NITRITE NEGATIVE 09/24/2016 0924   LEUKOCYTESUR NEGATIVE 09/24/2016 0924   Sepsis  Labs: @LABRCNTIP (procalcitonin:4,lacticidven:4)  )No results found for this or any previous visit (from the past 240 hour(s)).    Studies: No results found.  Scheduled Meds: . Dimethyl Fumarate  240 mg Oral BID  . feeding supplement (ENSURE ENLIVE)  237 mL Oral TID BM  . folic acid  1 mg Oral Daily  . mouth rinse  15 mL Mouth Rinse BID  . multivitamin with minerals  1 tablet Oral Daily  . pantoprazole  40 mg Oral BID AC  . polyethylene glycol  17 g Oral Daily  . QUEtiapine  25 mg Oral QHS  . senna-docusate  1 tablet Oral QHS  . sodium chloride flush  3 mL Intravenous Q12H  . thiamine  100 mg Oral Daily    Continuous Infusions:   LOS: 12 days     Briant Cedar, MD [facility]   If 7PM-7AM, please contact night-coverage [email or web address] Password [facility] 10/30/2017,  1:59 PM

## 2017-10-30 NOTE — NC FL2 (Signed)
[place] MEDICAID FL2 LEVEL OF CARE SCREENING TOOL     IDENTIFICATION  Patient Name: [name] Birthdate: 09/27/1955 Sex: male Admission Date (Current Location): 10/16/2017  [facility] and [place] Number:  Producer, television/film/video and Address:  [facility],  [address] [place] [address]      Provider Number: [id number]  Attending Physician Name and Address:  Briant Cedar, MD  Relative Name and Phone Number:       Current Level of Care: Hospital Recommended Level of Care: Skilled Nursing Facility Prior Approval Number:    Date Approved/Denied:   PASRR Number:    Discharge Plan: SNF    Current Diagnoses: Patient Active Problem List   Diagnosis Date Noted  . Acute encephalopathy   . Goals of care, counseling/discussion   . Palliative care encounter   . GI bleed 10/18/2017  . Symptomatic anemia   . Upper GI bleeding 10/16/2017  . Coccygeal pain, acute 04/08/2017  . Elevated TSH 04/06/2016  . Bipolar I disorder (HCC) 04/05/2016  . 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 Numbness 01/16/2016  . Alcohol abuse 07/16/2015  . 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 Other fatigue 06/29/2014  . Gait disturbance 06/29/2014  . Urinary urgency 06/29/2014  . High risk medication use 06/29/2014  . Bipolar affective disorder, depressed, severe (HCC) 09/18/2012    Class: Chronic  . MS (multiple sclerosis) (HCC) 10/31/2011    Class: Chronic  . Dysthymia 05/11/2011    Orientation RESPIRATION BLADDER Height & Weight     Self  Normal Incontinent, External catheter Weight: 159 lb 13.3 oz (72.5 kg) Height:  5\' 11"  (180.3 cm)  BEHAVIORAL SYMPTOMS/MOOD NEUROLOGICAL BOWEL NUTRITION STATUS      Continent Diet(Dysphagia 3 )  AMBULATORY STATUS COMMUNICATION OF NEEDS Skin   Extensive Assist Verbally Other (Comment)(incision, right groin)                       Personal Care Assistance Level of Assistance  Bathing, Feeding,  Dressing Bathing Assistance: Limited assistance Feeding assistance: Limited assistance Dressing Assistance: Limited assistance     Functional Limitations Info  Sight, Hearing, Speech Sight Info: Adequate Hearing Info: Adequate Speech Info: Adequate    SPECIAL CARE FACTORS FREQUENCY  PT (By licensed PT), OT (By licensed OT)     PT Frequency: 5x/week OT Frequency: 5x/week            Contractures Contractures Info: Not present    Additional Factors Info  Code Status, Allergies Code Status Info: Full  Allergies Info: NKA           Current Medications (10/30/2017):  This is the current hospital active medication list Current Facility-Administered Medications  Medication Dose Route Frequency Provider Last Rate Last Dose  . acetaminophen (TYLENOL) tablet 650 mg  650 mg Oral Q6H PRN Randel Pigg, Dorma Russell, MD   650 mg at 10/30/17 0253   Or  . acetaminophen (TYLENOL) suppository 650 mg  650 mg Rectal Q6H PRN Randel Pigg, Dorma Russell, MD      . Dimethyl Fumarate CPDR 240 mg  240 mg Oral BID Noralee Stain, DO      . feeding supplement (ENSURE ENLIVE) (ENSURE ENLIVE) liquid 237 mL  237 mL Oral TID BM Noralee Stain, DO   237 mL at 10/30/17 1012  . folic acid (FOLVITE) tablet 1 mg  1 mg Oral Daily Noralee Stain, DO   1 mg at 10/30/17 1012  . haloperidol lactate (  HALDOL) injection 5 mg  5 mg Intramuscular Q6H PRN Noralee Stain, DO   5 mg at 10/29/17 1611  . LORazepam (ATIVAN) injection 2 mg  2 mg Intramuscular Q4H PRN Noralee Stain, DO   2 mg at 10/30/17 0350  . MEDLINE mouth rinse  15 mL Mouth Rinse BID Noralee Stain, DO   15 mL at 10/30/17 1012  . multivitamin with minerals tablet 1 tablet  1 tablet Oral Daily Kerin Salen, MD   1 tablet at 10/30/17 1012  . ondansetron (ZOFRAN) tablet 4 mg  4 mg Oral Q6H PRN Randel Pigg, Dorma Russell, MD       Or  . ondansetron [facility]) injection 4 mg  4 mg Intravenous Q6H PRN Randel Pigg, Dorma Russell, MD   4 mg at 10/25/17 0026  . pantoprazole (PROTONIX) EC  tablet 40 mg  40 mg Oral BID AC Noralee Stain, DO   40 mg at 10/30/17 0820  . polyethylene glycol (MIRALAX / GLYCOLAX) packet 17 g  17 g Oral Daily Noralee Stain, DO   17 g at 10/30/17 1012  . QUEtiapine (SEROQUEL) tablet 25 mg  25 mg Oral QHS Mikhail, [place], DO   25 mg at 10/30/17 0220  . senna-docusate (Senokot-S) tablet 1 tablet  1 tablet Oral QHS Noralee Stain, DO   1 tablet at 10/30/17 0220  . sodium chloride flush (NS) 0.9 % injection 3 mL  3 mL Intravenous Q12H Randel Pigg, Dorma Russell, MD   3 mL at 10/28/17 2130  . thiamine (VITAMIN B-1) tablet 100 mg  100 mg Oral Daily Randel Pigg, Dorma Russell, MD   100 mg at 10/30/17 1012  . traZODone (DESYREL) tablet 25 mg  25 mg Oral QHS PRN Pearson Grippe, MD   25 mg at 10/30/17 0228     Discharge Medications: Please see discharge summary for a list of discharge medications.  Relevant Imaging Results:  Relevant Lab Results:   Additional Information ssn: [id number]  Coralyn Helling, LCSW

## 2017-10-31 ENCOUNTER — Inpatient Hospital Stay (HOSPITAL_COMMUNITY): Payer: Medicare HMO

## 2017-10-31 DIAGNOSIS — F17210 Nicotine dependence, cigarettes, uncomplicated: Secondary | ICD-10-CM

## 2017-10-31 DIAGNOSIS — F3130 Bipolar disorder, current episode depressed, mild or moderate severity, unspecified: Secondary | ICD-10-CM

## 2017-10-31 DIAGNOSIS — F1210 Cannabis abuse, uncomplicated: Secondary | ICD-10-CM

## 2017-10-31 LAB — BASIC METABOLIC PANEL
Anion gap: 8 (ref 5–15)
BUN: 9 mg/dL (ref 8–23)
CO2: 23 mmol/L (ref 22–32)
Calcium: 8.9 mg/dL (ref 8.9–10.3)
Chloride: 111 mmol/L (ref 98–111)
Creatinine, Ser: 0.91 mg/dL (ref 0.61–1.24)
GFR calc Af Amer: >60 mL/min (ref >60)
GFR calc non Af Amer: >60 mL/min (ref >60)
Glucose, Bld: 86 mg/dL (ref 70–99)
Potassium: 4.3 mmol/L (ref 3.5–5.1)
Sodium: 142 mmol/L (ref 135–145)

## 2017-10-31 LAB — CBC WITH DIFFERENTIAL/PLATELET
Basophils Absolute: 0 10*3/uL (ref 0.0–0.1)
Basophils Relative: 1 %
Eosinophils Absolute: 0.1 10*3/uL (ref 0.0–0.7)
Eosinophils Relative: 3 %
HCT: 36.3 % — ABNORMAL LOW (ref 39.0–52.0)
Hemoglobin: 11.5 g/dL — ABNORMAL LOW (ref 13.0–17.0)
Lymphocytes Relative: 22 %
Lymphs Abs: 0.8 10*3/uL (ref 0.7–4.0)
MCH: 31 pg (ref 26.0–34.0)
MCHC: 31.7 g/dL (ref 30.0–36.0)
MCV: 97.8 fL (ref 78.0–100.0)
Monocytes Absolute: 0.3 10*3/uL (ref 0.1–1.0)
Monocytes Relative: 8 %
Neutro Abs: 2.5 10*3/uL (ref 1.7–7.7)
Neutrophils Relative %: 66 %
Platelets: 352 10*3/uL (ref 150–400)
RBC: 3.71 MIL/uL — ABNORMAL LOW (ref 4.22–5.81)
RDW: 14.9 % (ref 11.5–15.5)
WBC: 3.8 10*3/uL — ABNORMAL LOW (ref 4.0–10.5)

## 2017-10-31 MED ORDER — LORAZEPAM 2 MG/ML IJ SOLN
1.0000 mg | Freq: Every day | INTRAMUSCULAR | Status: DC | PRN
  Administered 2017-11-01: 1 mg via INTRAMUSCULAR
  Filled 2017-10-31: qty 1

## 2017-10-31 MED ORDER — OLANZAPINE 2.5 MG PO TABS
2.5000 mg | ORAL_TABLET | Freq: Every day | ORAL | Status: DC
  Administered 2017-10-31 – 2017-11-01 (×2): 2.5 mg via ORAL
  Filled 2017-10-31 (×2): qty 1

## 2017-10-31 NOTE — Progress Notes (Signed)
Order for restraints placed.

## 2017-10-31 NOTE — Progress Notes (Addendum)
Patient received a 30 day PASRR [id number] E expires 11/29/17.  Patient is sedated and LCSW unable to discuss bed offers with patient.   Patient needs to be cleared by psych before dc to SNF. Patient must be sitter, including tele and restraint free for 24 hours before SNF will accept.   Beulah Gandy O'Donnell Long CSW [phone]

## 2017-10-31 NOTE — Consult Note (Signed)
[facility]iatry Consult   Reason for Consult:  Pt with Hx of MS, bipolar, currently severely agitated and combative. Currently not on any med for bipolar, unsure why... Please kindly evalute, thanks Referring Physician:  Dr. Horris Latino Patient Identification: [name] MRN:  [id number] Principal Diagnosis: Symptomatic anemia Diagnosis:   Patient Active Problem List   Diagnosis Date Noted  . Acute encephalopathy [G93.40]   . Goals of care, counseling/discussion [Z71.89]   . Palliative care encounter [Z51.5]   . GI bleed [K92.2] 10/18/2017  . Symptomatic anemia [D64.9]   . Upper GI bleeding [K92.2] 10/16/2017  . Coccygeal pain, acute [M53.3] 04/08/2017  . Elevated TSH [R79.89] 04/06/2016  . Bipolar I disorder ([place]) [F31.9] 04/05/2016  . 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 Numbness [R20.0] 01/16/2016  . Alcohol abuse [F10.10] 07/16/2015  . Alcohol-induced mood disorder ([place]) [F10.94] 07/16/2015  . Other fatigue [R53.83] 06/29/2014  . Gait disturbance [R26.9] 06/29/2014  . Urinary urgency [R39.15] 06/29/2014  . High risk medication use [Z79.899] 06/29/2014  . Bipolar affective disorder, depressed, severe ([place]) [F31.4] 09/18/2012    Class: Chronic  . MS (multiple sclerosis) ([place]) [G35] 10/31/2011    Class: Chronic  . Dysthymia [F34.1] 05/11/2011    Total Time spent with patient: 1 hour  Subjective:   [name] is a 62 y.o. male patient admitted to [facility] on 06/26 for symptomatic anemia and has medical hx significant of MS, Bipolar diosrder and alcohol and tobacco use. Psychiatry consulted for recommendation regarding agitation.    HPI:  Pt's chart was extensively reviewed including notes, MAR, current meds, etc. In brief per chart review pt was seen previously by psychiatry team at [facility] atleast twice in 2017 and 18 for the increase in depressive symptoms and SI. On 06/26 pt presented to ER with c/o abdominal pain associated with bloody nausea  and vomiting. He was admitted to medical floor for symptomatic anemia in the context of GI bleed. Team was concerned for delirium related to alcohol w/d initially during the admission and therefore he was put on CIWA protocol. Her was taking Wellbutrin, Abilify, Lexapro and Lithium prior to hospitalization which was continued at the begiining of admission however his girlfriend reported that pt was non compliant to these meds and therefore they were discontinued. He did not have BAL or UDS during this admission. Pt has been receiving Haldol and Ativan almost twice daily for agitation. Psychiatry was consulted today for agitation in the context of potential psychiatric decompensation.   Spoke with RN prior to seeing the patient who reported that patient has appeared confused and gets agitated atleast since past two days. He reported that at times pt is more coherent vs other times he is more confused. He reports that he gets more agitated towards the later part of the day.  Pt was laying in the bed with 4 points restraints, with mittens on his both hands, moaning, trying to get mittens out of his hands, rolling his thumb over the mitten, mostly unable to respond to questions. His appeared to have fluctuating level of consciousness, appeared intermittently agitated and responding to internal stimuli. He also appeared to have brief periods of times when he was able to respond to questions, answer what his name is and sustain attention, however most of the time it was difficult for him to answer questions or sustain attention. He appears to have sleeping disturbances and was only briefly oriented to self and said that he was in a  private residence.   Past Psychiatric History: Pt carries diagnoses of Bipolar disorder, Depression  Risk to Self:   Unable to assess as pt not able to answer to questions Risk to Others:   Unable to assess as pt not able to answer to questions Prior Inpatient Therapy:   Unable to  assess as pt not able to answer to questions Prior Outpatient Therapy:  Per records was receiving psychiatric medication prior to admission  Past Medical History:  Past Medical History:  Diagnosis Date  . Depression   . Mental disorder   . Movement disorder   . MS (multiple sclerosis) ([place])   . Multiple sclerosis, relapsing-remitting ([place])   . Neuromuscular disorder ([place])   . Vision abnormalities     Past Surgical History:  Procedure Laterality Date  . BIOPSY  10/17/2017   Procedure: BIOPSY;  Surgeon: Otis Brace, MD;  Location: [facility];  Service: Gastroenterology;;  . ESOPHAGOGASTRODUODENOSCOPY (EGD) WITH PROPOFOL N/A 10/17/2017   Procedure: ESOPHAGOGASTRODUODENOSCOPY (EGD) WITH PROPOFOL;  Surgeon: Otis Brace, MD;  Location: [facility];  Service: Gastroenterology;  Laterality: N/A;  . IR ANGIOGRAM SELECTIVE EACH ADDITIONAL VESSEL  10/21/2017  . IR ANGIOGRAM SELECTIVE EACH ADDITIONAL VESSEL  10/21/2017  . IR ANGIOGRAM VISCERAL SELECTIVE  10/21/2017  . IR EMBO ART  VEN HEMORR LYMPH EXTRAV  INC GUIDE ROADMAPPING  10/21/2017  . IR US GUIDE VASC ACCESS RIGHT  10/21/2017  . TONSILLECTOMY     Family History:  Family History  Problem Relation Age of Onset  . Heart disease Mother   . Stroke Father    Family Psychiatric  History: Unable to assess as pt not able to answer to questions Social History: Unable to assess as pt not able to answer to questions Social History   Substance and Sexual Activity  Alcohol Use Yes  . Alcohol/week: 0.0 oz   Comment: 2-3 times a week.      Social History   Substance and Sexual Activity  Drug Use Yes  . Types: Marijuana   Comment: 1-2 times a month    Social History   Socioeconomic History  . Marital status: Legally Separated    Spouse name: Not on file  . Number of children: Not on file  . Years of education: Not on file  . Highest education level: Not on file  Occupational History  . Not on file  Social Needs  . Financial  resource strain: Not on file  . Food insecurity:    Worry: Not on file    Inability: Not on file  . Transportation needs:    Medical: Not on file    Non-medical: Not on file  Tobacco Use  . Smoking status: Current Every Day Smoker    Packs/day: 3.00    Years: 36.00    Pack years: 108.00    Types: Cigarettes  . Smokeless tobacco: Never Used  Substance and Sexual Activity  . Alcohol use: Yes    Alcohol/week: 0.0 oz    Comment: 2-3 times a week.   . Drug use: Yes    Types: Marijuana    Comment: 1-2 times a month  . Sexual activity: Not Currently    Birth control/protection: None  Lifestyle  . Physical activity:    Days per week: Not on file    Minutes per session: Not on file  . Stress: Not on file  Relationships  . Social connections:    Talks on phone: Not on file    Gets together: Not  on file    Attends religious service: Not on file    Active member of club or organization: Not on file    Attends meetings of clubs or organizations: Not on file    Relationship status: Not on file  Other Topics Concern  . Not on file  Social History Narrative   ** Merged History Encounter **       Additional Social History:   Unable to assess as pt not able to answer to questions  Allergies:  No Known Allergies  Labs:  Results for orders placed or performed during the hospital encounter of 10/16/17 (from the past 48 hour(s))  CBC     Status: Abnormal   Collection Time: 10/30/17  6:07 AM  Result Value Ref Range   WBC 3.5 (L) 4.0 - 10.5 K/uL   RBC 3.33 (L) 4.22 - 5.81 MIL/uL   Hemoglobin 10.4 (L) 13.0 - 17.0 g/dL   HCT 32.0 (L) 39.0 - 52.0 %   MCV 96.1 78.0 - 100.0 fL   MCH 31.2 26.0 - 34.0 pg   MCHC 32.5 30.0 - 36.0 g/dL   RDW 14.8 11.5 - 15.5 %   Platelets 359 150 - 400 K/uL    Comment: Performed at [facility], [place] [address], [place], [place] [address]  Basic metabolic panel     Status: Abnormal   Collection Time: 10/30/17  6:07 AM  Result Value Ref  Range   Sodium 141 135 - 145 mmol/L   Potassium 3.9 3.5 - 5.1 mmol/L   Chloride 109 98 - 111 mmol/L    Comment: Please note change in reference range.   CO2 25 22 - 32 mmol/L   Glucose, Bld 113 (H) 70 - 99 mg/dL    Comment: Please note change in reference range.   BUN 15 8 - 23 mg/dL    Comment: Please note change in reference range.   Creatinine, Ser 1.01 0.61 - 1.24 mg/dL   Calcium 8.7 (L) 8.9 - 10.3 mg/dL   GFR calc non Af Amer >60 >60 mL/min   GFR calc Af Amer >60 >60 mL/min    Comment: (NOTE) The eGFR has been calculated using the CKD EPI equation. This calculation has not been validated in all clinical situations. eGFR's persistently <60 mL/min signify possible Chronic Kidney Disease.    Anion gap 7 5 - 15    Comment: Performed at [facility], [place] [address], [place], [place] [address]  CBC with Differential/Platelet     Status: Abnormal   Collection Time: 10/31/17  6:29 AM  Result Value Ref Range   WBC 3.8 (L) 4.0 - 10.5 K/uL   RBC 3.71 (L) 4.22 - 5.81 MIL/uL   Hemoglobin 11.5 (L) 13.0 - 17.0 g/dL   HCT 36.3 (L) 39.0 - 52.0 %   MCV 97.8 78.0 - 100.0 fL   MCH 31.0 26.0 - 34.0 pg   MCHC 31.7 30.0 - 36.0 g/dL   RDW 14.9 11.5 - 15.5 %   Platelets 352 150 - 400 K/uL   Neutrophils Relative % 66 %   Neutro Abs 2.5 1.7 - 7.7 K/uL   Lymphocytes Relative 22 %   Lymphs Abs 0.8 0.7 - 4.0 K/uL   Monocytes Relative 8 %   Monocytes Absolute 0.3 0.1 - 1.0 K/uL   Eosinophils Relative 3 %   Eosinophils Absolute 0.1 0.0 - 0.7 K/uL   Basophils Relative 1 %   Basophils Absolute 0.0 0.0 - 0.1  K/uL    Comment: Performed at [facility], [place] [address], [place], [place] [address]  Basic metabolic panel     Status: None   Collection Time: 10/31/17  6:29 AM  Result Value Ref Range   Sodium 142 135 - 145 mmol/L   Potassium 4.3 3.5 - 5.1 mmol/L   Chloride 111 98 - 111 mmol/L    Comment: Please note change in reference range.   CO2 23 22 - 32  mmol/L   Glucose, Bld 86 70 - 99 mg/dL    Comment: Please note change in reference range.   BUN 9 8 - 23 mg/dL    Comment: Please note change in reference range.   Creatinine, Ser 0.91 0.61 - 1.24 mg/dL   Calcium 8.9 8.9 - 10.3 mg/dL   GFR calc non Af Amer >60 >60 mL/min   GFR calc Af Amer >60 >60 mL/min    Comment: (NOTE) The eGFR has been calculated using the CKD EPI equation. This calculation has not been validated in all clinical situations. eGFR's persistently <60 mL/min signify possible Chronic Kidney Disease.    Anion gap 8 5 - 15    Comment: Performed at [facility], [place] [address] [place], [place] [address]    Current Facility-Administered Medications  Medication Dose Route Frequency Provider Last Rate Last Dose  . acetaminophen (TYLENOL) tablet 650 mg  650 mg Oral Q6H PRN Patrecia Pour, Christean Grief, MD   650 mg at 10/30/17 0253   Or  . acetaminophen (TYLENOL) suppository 650 mg  650 mg Rectal Q6H PRN Patrecia Pour, Christean Grief, MD      . Dimethyl Fumarate CPDR 240 mg  240 mg Oral BID Dessa Phi, DO      . feeding supplement (ENSURE ENLIVE) (ENSURE ENLIVE) liquid 237 mL  237 mL Oral TID BM Dessa Phi, DO   237 mL at 10/30/17 2155  . folic acid (FOLVITE) tablet 1 mg  1 mg Oral Daily Dessa Phi, DO   1 mg at 10/30/17 1012  . haloperidol lactate (HALDOL) injection 5 mg  5 mg Intramuscular Q6H PRN Dessa Phi, DO   5 mg at 10/31/17 1608  . LORazepam (ATIVAN) injection 2 mg  2 mg Intramuscular Q4H PRN Dessa Phi, DO   2 mg at 10/31/17 1608  . MEDLINE mouth rinse  15 mL Mouth Rinse BID Dessa Phi, DO   15 mL at 10/30/17 1012  . multivitamin with minerals tablet 1 tablet  1 tablet Oral Daily Ronnette Juniper, MD   1 tablet at 10/30/17 1012  . ondansetron (ZOFRAN) tablet 4 mg  4 mg Oral Q6H PRN Patrecia Pour, Christean Grief, MD       Or  . ondansetron [facility]) injection 4 mg  4 mg Intravenous Q6H PRN Patrecia Pour, Christean Grief, MD   4 mg at 10/25/17 0026  . pantoprazole  (PROTONIX) EC tablet 40 mg  40 mg Oral BID AC Dessa Phi, DO   40 mg at 10/30/17 0820  . polyethylene glycol (MIRALAX / GLYCOLAX) packet 17 g  17 g Oral Daily Dessa Phi, DO   17 g at 10/30/17 1012  . QUEtiapine (SEROQUEL) tablet 25 mg  25 mg Oral QHS Mikhail, [place], DO   25 mg at 10/30/17 2144  . senna-docusate (Senokot-S) tablet 1 tablet  1 tablet Oral QHS Dessa Phi, DO   1 tablet at 10/30/17 2145  . sodium chloride flush (NS) 0.9 % injection 3 mL  3 mL Intravenous Q12H Quincy Simmonds  Ocie Bob, MD   3 mL at 10/28/17 2130  . thiamine (VITAMIN B-1) tablet 100 mg  100 mg Oral Daily Patrecia Pour, Christean Grief, MD   100 mg at 10/30/17 1012  . traZODone (DESYREL) tablet 25 mg  25 mg Oral QHS PRN Jani Gravel, MD   25 mg at 10/31/17 0208    Musculoskeletal: Strength & Muscle Tone: Unable to assess as pt in 4 points restraints. Gait & Station: Unable to assess as pt in 4 points restraints Patient leans: N/A  Psychiatric Specialty Exam: Physical Exam  ROS  Blood pressure 106/68, pulse 82, temperature 97.6 F (36.4 C), resp. rate 18, height '5\' 11"'  (1.803 m), weight 68.8 kg (151 lb 10.8 oz), SpO2 100 %.Body mass index is 21.15 kg/m.  General Appearance: Disheveled  Eye Contact:  Poor  Speech:  Garbled  Volume:  Decreased  Mood:  Angry and Irritable  Affect:  Blunt  Thought Process:  Disorganized  Orientation:  Other:  very briefly to person  Thought Content:  Hallucinations: Auditory  Suicidal Thoughts:  Unable to assess as pt not able to answer to questions  Homicidal Thoughts:  Unable to assess as pt not able to answer to questions  Memory:  Unable to assess as pt not able to answer to questions  Judgement:  Other:  Unable to assess as pt not able to answer to questions  Insight:  Unable to assess as pt not able to answer to questions  Psychomotor Activity:  Restlessness  Concentration:  Concentration: Poor and Attention Span: Poor  Recall:  Unable to assess as pt not able to answer to  questions  Fund of Knowledge:  Unable to assess as pt not able to answer to questions  Language:  Unable to assess as pt not able to answer to questions  Akathisia:  No    AIMS (if indicated):     Assets:  Others:  Unable to assess as pt not able to answer to questions  ADL's:  Impaired  Cognition:  Impaired,  Severe  Sleep:      Assessment:  - Pt with hx of bipolar disorder, MS, currently admitted to [facility] for symptomatic anemia in the context of GI bleed - Currently off of psychiatric meds as fam reported that pt was non compliant prior to admission and meds include Wellbutrin, Lithium, Lexapro and Abilify  - Pt appears delirious given fluctuating consciousness/attention, impaired cogntive functions, responding to internal stimuli speech and sleep disturbances, and frequently agitated on evaluation.  - Presentation doesn't appear to be in the context of manic or psychotic decompensation.   Treatment Plan Summary:  Would recommend following.  - Stop Seroquel 25 mg QHS and start Zyprexa 2.5 mg QHS - Can use Haldol 5 mg Q6hrs PRN PO/IM for agitation - Pt has received Ativan at least 2 mg BID PRN for the past 4-5 days, would recommend to taper Ativan to 1 mg BID starting 07/11 and 1 mg QHS on 07/12 and stop as it can worsen the delirium. - Daily EKG, QTC [id number] - EEG - Will continue to follow up    Orlene Erm, MD 10/31/2017 5:37 PM

## 2017-10-31 NOTE — Progress Notes (Signed)
Patient combative, agitated, pulling at tubes.  When patient redirected he gets violent and tries to punch and kick staff.

## 2017-10-31 NOTE — Progress Notes (Signed)
PT Cancellation Note  Patient Details Name: [name] MRN: [id number] DOB: 11/10/1955   Cancelled Treatment:     RN and NT in room, pt present with increased confusion and some agitation.  Pt has bee evaluated with rec for SNF.     Felecia Shelling  PTA [facility]  Acute  Rehab Pager      [phone]

## 2017-10-31 NOTE — Progress Notes (Signed)
PROGRESS NOTE  [name] NFA:[id number] DOB: 1955-06-13 DOA: 10/16/2017 PCP: Rinaldo Cloud, MD  HPI/Recap of past 70 hours: 62 year old male with medical history significant for MS, bipolar disorder, alcohol/tobacco abuse, presented to the ER complaining of abdominal pain, nausea/vomiting for the past 2 weeks.  Patient denied any NSAID/aspirin use, no history of GI bleeding in the past.  Patient underwent EGD which revealed a large, deep duodenal bulb ulcer.  Patient underwent empiric embolization by IR on 7/1.  Hospitalization further complicated by acute encephalopathy.  Today, patient noted to be sleepy, but easily arousable, unable to have meaningful conversation due to mental status as well as sedative meds.  Assessment/Plan: Principal Problem:   Symptomatic anemia Active Problems:   MS (multiple sclerosis) (H[facility])   Bipolar affective disorder, depressed, severe ([facility])   Bipolar I disorder ([facility])   Upper GI bleeding   GI bleed   Acute encephalopathy   Goals of care, counseling/discussion   Palliative care encounter  Upper GI bleed due to duodenal bulb ulcer Likely due to chronic alcohol abuse Positive FOBT, with hemoglobin drop GI consulted, status post EGD on 6/27 showed very large deep duodenal bulb ulcer, LA grade D esophagitis IR consulted, status post empiric embolization on 10/21/2017 Continue PPI 40 mg twice daily for 8 weeks, then 40 mg daily indefinitely Need repeat EGD in 8 weeks to document healing of esophagitis as well as large duodenal bulb ulcer.  Will follow up with GI in 6 weeks  Acute blood loss anemia Likely due to above Hemoglobin at baseline 14-15, 10.1 on admission Status post 2 units of packed RBC on 7/4 Anemia panel showed iron of 77, ferritin of 122, vitamin B12 180, folate 11.4 Vitamin B12 on the low normal side, status post 1 dose of B12 given Daily CBC  Acute metabolic encephalopathy Unclear etiology, waxing and waning, likely ?acute  delirium ??  Alcohol withdrawal Noted to be combative, had to be put on restraints No current signs of infection, will order CXR, UA CT head negative for any acute intracranial abnormalities EEG pending Consulted psych, appreciate rec for med changes, start zyprexa, continue haldol and d/c ativan Consider neurology in am Monitor closely, fall precautions  Multiple sclerosis Followed by neurology as an outpatient On home tecfidera, pharmacy does not stock medication, patient has to bring home meds  Bipolar disorder Unsure if patient is on any meds at home Psychiatry consulted, likely delirium, unlikely manic episode from bipolar, rec med adjustment as mentioned above  Alcohol abuse Last alcoholic drink was over 1 week ago Counseled against     Code Status: Full  Family Communication: None at bedside  Disposition Plan: SNF   Consultants:  GI  IR  Psychiatry  Procedures:  EGD  Embolization by IR  Antimicrobials:  None  DVT prophylaxis: SCDs   Objective: Vitals:   10/30/17 1418 10/30/17 2229 10/31/17 0459 10/31/17 1418  BP: 111/70 106/74 101/78 106/68  Pulse: 76 92 93 82  Resp: 20 20 20 18   Temp: 97.7 F (36.5 C) 97.6 F (36.4 C) 97.6 F (36.4 C)   TempSrc: Oral     SpO2: 100% 100%  100%  Weight:   68.8 kg (151 lb 10.8 oz)   Height:        Intake/Output Summary (Last 24 hours) at 10/31/2017 2022 Last data filed at 10/31/2017 1857 Gross per 24 hour  Intake 600 ml  Output 900 ml  Net -300 ml   Filed Weights   10/27/17 1347  10/29/17 2100 10/31/17 0459  Weight: 70.6 kg (155 lb 10.3 oz) 72.5 kg (159 lb 13.3 oz) 68.8 kg (151 lb 10.8 oz)    Exam:   General: NAD  Cardiovascular: S1, S2 present  Respiratory: CTA B  Abdomen: Soft, nontender, nondistended, bowel sounds present  Musculoskeletal: No pedal edema bilaterally  Skin: Normal  Psychiatry: Still agitated, unable to assess   Data Reviewed: CBC: Recent Labs  Lab  10/26/17 0511 10/27/17 0451 10/28/17 0532 10/30/17 0607 10/31/17 0629  WBC 6.6 4.9 4.6 3.5* 3.8*  NEUTROABS  --   --   --   --  2.5  HGB 10.5* 10.8* 10.3* 10.4* 11.5*  HCT 32.0* 33.1* 31.5* 32.0* 36.3*  MCV 96.1 97.1 96.6 96.1 97.8  PLT 286 296 296 359 352   Basic Metabolic Panel: Recent Labs  Lab 10/25/17 0403 10/26/17 0511 10/27/17 0451 10/28/17 0532 10/30/17 0607 10/31/17 0629  NA 141 145 142 145 141 142  K 3.8 3.6 4.0 4.1 3.9 4.3  CL 116* 117* 115* 115* 109 111  CO2 22 22 20* 22 25 23   GLUCOSE 101* 93 87 100* 113* 86  BUN 16 12 10 14 15 9   CREATININE 0.97 0.98 0.99 0.96 1.01 0.91  CALCIUM 8.0* 8.1* 8.0* 8.4* 8.7* 8.9  MG 2.1 2.1 2.1 2.1  --   --   PHOS 3.2 3.4 3.0 3.5  --   --    GFR: Estimated Creatinine Clearance: 81.9 mL/min (by C-G formula based on SCr of 0.91 mg/dL). Liver Function Tests: No results for input(s): AST, ALT, ALKPHOS, BILITOT, PROT, ALBUMIN in the last 168 hours. No results for input(s): LIPASE, AMYLASE in the last 168 hours. No results for input(s): AMMONIA in the last 168 hours. Coagulation Profile: No results for input(s): INR, PROTIME in the last 168 hours. Cardiac Enzymes: No results for input(s): CKTOTAL, CKMB, CKMBINDEX, TROPONINI in the last 168 hours. BNP (last 3 results) No results for input(s): PROBNP in the last 8760 hours. HbA1C: No results for input(s): HGBA1C in the last 72 hours. CBG: Recent Labs  Lab 10/25/17 2027 10/26/17 0012 10/26/17 0446 10/26/17 1556 10/27/17 2034  GLUCAP 81 74 86 94 121*   Lipid Profile: No results for input(s): CHOL, HDL, LDLCALC, TRIG, CHOLHDL, LDLDIRECT in the last 72 hours. Thyroid Function Tests: No results for input(s): TSH, T4TOTAL, FREET4, T3FREE, THYROIDAB in the last 72 hours. Anemia Panel: No results for input(s): VITAMINB12, FOLATE, FERRITIN, TIBC, IRON, RETICCTPCT in the last 72 hours. Urine analysis:    Component Value Date/Time   COLORURINE YELLOW 09/24/2016 0924    APPEARANCEUR CLEAR 09/24/2016 0924   LABSPEC 1.009 09/24/2016 0924   PHURINE 7.0 09/24/2016 0924   GLUCOSEU NEGATIVE 09/24/2016 0924   HGBUR NEGATIVE 09/24/2016 0924   BILIRUBINUR NEGATIVE 09/24/2016 0924   KETONESUR NEGATIVE 09/24/2016 0924   PROTEINUR NEGATIVE 09/24/2016 0924   UROBILINOGEN 0.2 07/08/2013 0930   NITRITE NEGATIVE 09/24/2016 0924   LEUKOCYTESUR NEGATIVE 09/24/2016 0924   Sepsis Labs: @LABRCNTIP (procalcitonin:4,lacticidven:4)  )No results found for this or any previous visit (from the past 240 hour(s)).    Studies: Ct Head Wo Contrast  Result Date: 10/31/2017 CLINICAL DATA:  62 y/o M; increased confusion and agitation. Encephalopathy. History of multiple sclerosis and neuromuscular disorder. EXAM: CT HEAD WITHOUT CONTRAST TECHNIQUE: Contiguous axial images were obtained from the base of the skull through the vertex without intravenous contrast. COMPARISON:  08/11/2015 MRI of the head. FINDINGS: Brain: No evidence of acute infarction, hemorrhage, hydrocephalus, extra-axial collection  or mass lesion/mass effect. Stable moderate diffuse white matter changes and small areas of encephalomalacia within the superior frontal lobes bilaterally. Stable moderate brain parenchymal volume loss. Vascular: Calcific atherosclerosis of carotid siphons. No hyperdense vessel. Skull: Normal. Negative for fracture or focal lesion. Sinuses/Orbits: No acute finding. Other: None. IMPRESSION: 1. No acute intracranial abnormality identified. 2. Stable moderate white matter changes and brain parenchymal volume loss given differences in technique. Stable small areas of encephalomalacia within the superior frontal gyri. Electronically Signed   By: Mitzi Hansen M.D.   On: 10/31/2017 17:17    Scheduled Meds: . Dimethyl Fumarate  240 mg Oral BID  . feeding supplement (ENSURE ENLIVE)  237 mL Oral TID BM  . folic acid  1 mg Oral Daily  . mouth rinse  15 mL Mouth Rinse BID  . multivitamin with  minerals  1 tablet Oral Daily  . OLANZapine  2.5 mg Oral QHS  . pantoprazole  40 mg Oral BID AC  . polyethylene glycol  17 g Oral Daily  . senna-docusate  1 tablet Oral QHS  . sodium chloride flush  3 mL Intravenous Q12H  . thiamine  100 mg Oral Daily    Continuous Infusions:   LOS: 13 days     Briant Cedar, MD [facility]   If 7PM-7AM, please contact night-coverage [email or web address] Password [facility] 10/31/2017, 8:22 PM

## 2017-10-31 NOTE — Clinical Social Work Note (Addendum)
Clinical Social Work Assessment  Patient Details  Name: [name] MRN: [id number] Date of Birth: Nov 17, 1955  Date of referral:  10/30/17               Reason for consult:  Facility Placement                Permission sought to share information with:  Case Manager, Customer service manager Permission granted to share information::  Yes, Verbal Permission Granted  Name::        Agency::  SNF   Relationship::     Contact Information:     Housing/Transportation Living arrangements for the past 2 months:  Mobile Home Source of Information:  Patient, Medical Team Patient Interpreter Needed:  None Criminal Activity/Legal Involvement Pertinent to Current Situation/Hospitalization:  No - Comment as needed Significant Relationships:  Friend Lives with:  Self Do you feel safe going back to the place where you live?  No Need for family participation in patient care:  Yes (Comment)  Care giving concerns:  Patient lives alone.    Social Worker assessment / plan:  LCSW following for SNF placement.   Patient admitted for Symtomatic anemia.   Patient has been in the hospital for 12 days. Patient became agitated and combative last 2 days and has restraints and a telesitter.   Patient was cooperative with PT. Patient uses a 2 wheeled walker [place] and lives alone. Patient has steps to enter his home. Patient is able to navigate steps at baseline. Patient is independent in ADLs.   PT/OT recommends SNF at dc.   Patient is agreeable that he " can't go home like this"  Patient has [employer] and will need pre auth. Patient will need to be cleared by psych due to agitation and aggression before SNF will accept patient.   Employment status:  Disabled (Comment on whether or not currently receiving Disability) Insurance information:  Managed Medicare PT Recommendations:  [place] / Referral to community resources:  [place]  Patient/Family's Response to care:  Unable to assess.   Patient/Family's Understanding of and Emotional Response to Diagnosis, Current Treatment, and Prognosis:  Patient states he " cannot go home like this"   Emotional Assessment Appearance:  Appears older than stated age Attitude/Demeanor/Rapport:  Sedated Affect (typically observed):  Unable to Assess Orientation:  Oriented to Self, Oriented to Place, Oriented to  Time, Oriented to Situation Alcohol / Substance use:  Alcohol Use Psych involvement (Current and /or in the community):  No (Comment)  Discharge Needs  Concerns to be addressed:  Home Safety Concerns, Mental Health Concerns Readmission within the last 30 days:  No Current discharge risk:  None Barriers to Discharge:  Continued Medical Work up, [facility] will not accept until restraint criteria met, No SNF bed   Servando Snare, LCSW 10/31/2017, 10:45 AM

## 2017-11-01 ENCOUNTER — Inpatient Hospital Stay (HOSPITAL_COMMUNITY): Payer: Medicare HMO

## 2017-11-01 ENCOUNTER — Inpatient Hospital Stay (HOSPITAL_COMMUNITY)
Admit: 2017-11-01 | Discharge: 2017-11-01 | Disposition: A | Discharge status: Home / Self Care | Payer: Medicare HMO | Attending: Internal Medicine | Admitting: Internal Medicine

## 2017-11-01 LAB — CBC WITH DIFFERENTIAL/PLATELET
Basophils Absolute: 0 10*3/uL (ref 0.0–0.1)
Basophils Relative: 1 %
Eosinophils Absolute: 0.1 10*3/uL (ref 0.0–0.7)
Eosinophils Relative: 3 %
HCT: 37.6 % — ABNORMAL LOW (ref 39.0–52.0)
Hemoglobin: 11.9 g/dL — ABNORMAL LOW (ref 13.0–17.0)
Lymphocytes Relative: 21 %
Lymphs Abs: 0.8 10*3/uL (ref 0.7–4.0)
MCH: 31 pg (ref 26.0–34.0)
MCHC: 31.6 g/dL (ref 30.0–36.0)
MCV: 97.9 fL (ref 78.0–100.0)
Monocytes Absolute: 0.3 10*3/uL (ref 0.1–1.0)
Monocytes Relative: 8 %
Neutro Abs: 2.7 10*3/uL (ref 1.7–7.7)
Neutrophils Relative %: 67 %
Platelets: 395 10*3/uL (ref 150–400)
RBC: 3.84 MIL/uL — ABNORMAL LOW (ref 4.22–5.81)
RDW: 14.8 % (ref 11.5–15.5)
WBC: 3.9 10*3/uL — ABNORMAL LOW (ref 4.0–10.5)

## 2017-11-01 LAB — BASIC METABOLIC PANEL
Anion gap: 9 (ref 5–15)
BUN: 10 mg/dL (ref 8–23)
CO2: 24 mmol/L (ref 22–32)
Calcium: 9.2 mg/dL (ref 8.9–10.3)
Chloride: 109 mmol/L (ref 98–111)
Creatinine, Ser: 1 mg/dL (ref 0.61–1.24)
GFR calc Af Amer: >60 mL/min (ref >60)
GFR calc non Af Amer: >60 mL/min (ref >60)
Glucose, Bld: 86 mg/dL (ref 70–99)
Potassium: 4.1 mmol/L (ref 3.5–5.1)
Sodium: 142 mmol/L (ref 135–145)

## 2017-11-01 LAB — URINALYSIS, ROUTINE W REFLEX MICROSCOPIC
Bilirubin Urine: NEGATIVE
Glucose, UA: NEGATIVE mg/dL
Hgb urine dipstick: NEGATIVE
Ketones, ur: NEGATIVE mg/dL
Leukocytes, UA: NEGATIVE
Nitrite: POSITIVE — AB
Protein, ur: NEGATIVE mg/dL
Specific Gravity, Urine: 1.015 (ref 1.005–1.030)
pH: 6 (ref 5.0–8.0)

## 2017-11-01 MED ORDER — OLANZAPINE 2.5 MG PO TABS
2.5000 mg | ORAL_TABLET | Freq: Two times a day (BID) | ORAL | Status: DC
  Administered 2017-11-02: 2.5 mg via ORAL
  Filled 2017-11-01: qty 1

## 2017-11-01 MED ORDER — OLANZAPINE 10 MG IM SOLR
5.0000 mg | Freq: Three times a day (TID) | INTRAMUSCULAR | Status: DC | PRN
  Administered 2017-11-03 – 2017-11-09 (×5): 5 mg via INTRAMUSCULAR
  Filled 2017-11-01 (×7): qty 10

## 2017-11-01 MED ORDER — PANTOPRAZOLE SODIUM 40 MG PO PACK
40.0000 mg | PACK | Freq: Two times a day (BID) | ORAL | Status: DC
  Administered 2017-11-01 – 2017-11-10 (×8): 40 mg
  Filled 2017-11-01 (×22): qty 20

## 2017-11-01 NOTE — Progress Notes (Signed)
PROGRESS NOTE  [name] WUJ:[id number] DOB: [birth year] DOA: 10/16/2017 PCP: Rinaldo Cloud, MD  HPI/Recap of past 80 hours: 62 year old male with medical history significant for MS, bipolar disorder, alcohol/tobacco abuse, presented to the ER complaining of abdominal pain, nausea/vomiting for the past 2 weeks.  Patient denied any NSAID/aspirin use, no history of GI bleeding in the past.  Patient underwent EGD which revealed a large, deep duodenal bulb ulcer.  Patient underwent empiric embolization by IR on 7/1.  Hospitalization further complicated by acute encephalopathy.  Today, patient noted to be more awake, speech not still clear, unable to have meaningful conversation due to mental status as well as sedative meds.  Assessment/Plan: Principal Problem:   Symptomatic anemia Active Problems:   MS (multiple sclerosis) (HCC)   Bipolar affective disorder, depressed, severe (HCC)   Bipolar I disorder (HCC)   Upper GI bleeding   GI bleed   Acute encephalopathy   Goals of care, counseling/discussion   Palliative care encounter  Upper GI bleed due to duodenal bulb ulcer Likely due to chronic alcohol abuse Positive FOBT, with hemoglobin drop GI consulted, status post EGD on 6/27 showed very large deep duodenal bulb ulcer, LA grade D esophagitis IR consulted, status post empiric embolization on 10/21/2017 Continue PPI 40 mg twice daily for 8 weeks, then 40 mg daily indefinitely Need repeat EGD in 8 weeks to document healing of esophagitis as well as large duodenal bulb ulcer.  Will follow up with GI in 6 weeks  Acute blood loss anemia Likely due to above Hemoglobin at baseline 14-15, 10.1 on admission Status post 2 units of packed RBC on 7/4 Anemia panel showed iron of 77, ferritin of 122, vitamin B12 180, folate 11.4 Vitamin B12 on the low normal side, status post 1 dose of B12 given Daily CBC  Acute metabolic encephalopathy Unclear etiology, waxing and waning, likely ?acute  delirium ??  Alcohol withdrawal Noted to be combative, had to be put on restraints No current signs of infection, CXR negative UA with positive nitrite only, UC pending CT head negative for any acute intracranial abnormalities EEG pending report Consulted psych, appreciate rec for med changes, start zyprexa, continue haldol and d/c ativan Consider neurology consult pending EEG Monitor closely, fall precautions  Multiple sclerosis Followed by neurology as an outpatient On home tecfidera, pharmacy does not stock medication, patient has to bring home meds  Bipolar disorder Unsure if patient is on any meds at home Psychiatry consulted, likely delirium, unlikely manic episode from bipolar, rec med adjustment as mentioned above  Alcohol abuse Last alcoholic drink was over 1 week ago Counseled against     Code Status: Full  Family Communication: None at bedside  Disposition Plan: SNF   Consultants:  GI  IR  Psychiatry  Procedures:  EGD  Embolization by IR  Antimicrobials:  None  DVT prophylaxis: SCDs   Objective: Vitals:   10/31/17 1418 10/31/17 2031 11/01/17 0416 11/01/17 1418  BP: 106/68 117/77 128/83 (!) 121/95  Pulse: 82 79 91   Resp: 18 20 20 18   Temp:  (!) 97.5 F (36.4 C) (!) 97.5 F (36.4 C)   TempSrc:  Oral Oral   SpO2: 100% 100%  100%  Weight:      Height:        Intake/Output Summary (Last 24 hours) at 11/01/2017 1708 Last data filed at 11/01/2017 1210 Gross per 24 hour  Intake 180 ml  Output 200 ml  Net -20 ml   [employer]  10/27/17 1347 10/29/17 2100 10/31/17 0459  Weight: 70.6 kg (155 lb 10.3 oz) 72.5 kg (159 lb 13.3 oz) 68.8 kg (151 lb 10.8 oz)    Exam:   General: NAD  Cardiovascular: S1, S2 present  Respiratory: CTA B  Abdomen: Soft, nontender, nondistended, bowel sounds present  Musculoskeletal: No pedal edema bilaterally  Skin: Normal  Psychiatry: Unable to assess   Data Reviewed: CBC: Recent Labs  Lab  10/27/17 0451 10/28/17 0532 10/30/17 0607 10/31/17 0629 11/01/17 0552  WBC 4.9 4.6 3.5* 3.8* 3.9*  NEUTROABS  --   --   --  2.5 2.7  HGB 10.8* 10.3* 10.4* 11.5* 11.9*  HCT 33.1* 31.5* 32.0* 36.3* 37.6*  MCV 97.1 96.6 96.1 97.8 97.9  PLT 296 296 359 352 395   Basic Metabolic Panel: Recent Labs  Lab 10/26/17 0511 10/27/17 0451 10/28/17 0532 10/30/17 0607 10/31/17 0629 11/01/17 0552  NA 145 142 145 141 142 142  K 3.6 4.0 4.1 3.9 4.3 4.1  CL 117* 115* 115* 109 111 109  CO2 22 20* 22 25 23 24   GLUCOSE 93 87 100* 113* 86 86  BUN 12 10 14 15 9 10   CREATININE 0.98 0.99 0.96 1.01 0.91 1.00  CALCIUM 8.1* 8.0* 8.4* 8.7* 8.9 9.2  MG 2.1 2.1 2.1  --   --   --   PHOS 3.4 3.0 3.5  --   --   --    GFR: Estimated Creatinine Clearance: 74.5 mL/min (by C-G formula based on SCr of 1 mg/dL). Liver Function Tests: No results for input(s): AST, ALT, ALKPHOS, BILITOT, PROT, ALBUMIN in the last 168 hours. No results for input(s): LIPASE, AMYLASE in the last 168 hours. No results for input(s): AMMONIA in the last 168 hours. Coagulation Profile: No results for input(s): INR, PROTIME in the last 168 hours. Cardiac Enzymes: No results for input(s): CKTOTAL, CKMB, CKMBINDEX, TROPONINI in the last 168 hours. BNP (last 3 results) No results for input(s): PROBNP in the last 8760 hours. HbA1C: No results for input(s): HGBA1C in the last 72 hours. CBG: Recent Labs  Lab 10/25/17 2027 10/26/17 0012 10/26/17 0446 10/26/17 1556 10/27/17 2034  GLUCAP 81 74 86 94 121*   Lipid Profile: No results for input(s): CHOL, HDL, LDLCALC, TRIG, CHOLHDL, LDLDIRECT in the last 72 hours. Thyroid Function Tests: No results for input(s): TSH, T4TOTAL, FREET4, T3FREE, THYROIDAB in the last 72 hours. Anemia Panel: No results for input(s): VITAMINB12, FOLATE, FERRITIN, TIBC, IRON, RETICCTPCT in the last 72 hours. Urine analysis:    Component Value Date/Time   COLORURINE YELLOW 11/01/2017 0208   APPEARANCEUR  CLEAR 11/01/2017 0208   LABSPEC 1.015 11/01/2017 0208   PHURINE 6.0 11/01/2017 0208   GLUCOSEU NEGATIVE 11/01/2017 0208   HGBUR NEGATIVE 11/01/2017 0208   BILIRUBINUR NEGATIVE 11/01/2017 0208   KETONESUR NEGATIVE 11/01/2017 0208   PROTEINUR NEGATIVE 11/01/2017 0208   UROBILINOGEN 0.2 07/08/2013 0930   NITRITE POSITIVE (A) 11/01/2017 0208   LEUKOCYTESUR NEGATIVE 11/01/2017 0208   Sepsis Labs: @LABRCNTIP (procalcitonin:4,lacticidven:4)  )No results found for this or any previous visit (from the past 240 hour(s)).    Studies: Dg Chest Port 1 View  Result Date: 11/01/2017 CLINICAL DATA:  62 year old male with encephalopathy EXAM: PORTABLE CHEST 1 VIEW COMPARISON:  Prior chest x-ray 09/24/2016 FINDINGS: The lungs are clear and negative for focal airspace consolidation, pulmonary edema or suspicious pulmonary nodule. No pleural effusion or pneumothorax. Cardiac and mediastinal contours are within normal limits. No acute fracture or lytic or blastic  osseous lesions. The visualized upper abdominal bowel gas pattern is unremarkable. IMPRESSION: No active disease. Electronically Signed   By: Malachy Moan M.D.   On: 11/01/2017 08:38    Scheduled Meds: . Dimethyl Fumarate  240 mg Oral BID  . feeding supplement (ENSURE ENLIVE)  237 mL Oral TID BM  . folic acid  1 mg Oral Daily  . mouth rinse  15 mL Mouth Rinse BID  . multivitamin with minerals  1 tablet Oral Daily  . OLANZapine  2.5 mg Oral QHS  . pantoprazole sodium  40 mg Per Tube BID AC  . polyethylene glycol  17 g Oral Daily  . senna-docusate  1 tablet Oral QHS  . sodium chloride flush  3 mL Intravenous Q12H  . thiamine  100 mg Oral Daily    Continuous Infusions:   LOS: 14 days     Briant Cedar, MD [facility]   If 7PM-7AM, please contact night-coverage [email or web address] Password [facility] 11/01/2017, 5:08 PM

## 2017-11-01 NOTE — Progress Notes (Signed)
  Speech Language Pathology Treatment: Dysphagia  Patient Details Name: [name] MRN: [id number] DOB: Mar 31, 1956 Today's Date: 11/01/2017 Time: 1555-1610 SLP Time Calculation (min) (ACUTE ONLY): 15 min  Assessment / Plan / Recommendation Clinical Impression  SLP follow up for diet tolerance and education. Per RN, pt has been eating dys 3 solids, and tolerating this diet without difficulty. Difficulty reported with pulling liquids all the way through a straw, but no overt s/s aspiration reported. SLP cut several straws in half and left them in pt room. Pt also has a travel mug with small opening for cup sips. Pt adamantly and angrily refused po presentations attempted by SLP. Given RN report, and facts that Lungs are clear and pt is afebrile, ST will sign off at this time. Please reconsult if needs arise.    HPI HPI: 62 y.o. male with medical history significant of MS, bipolar disorder, alcohol and tobacco abuse, presented to the emergency department complaining of abdominal pain with nausea and vomiting.  Patient is poor historian.  He reports to 2-3 days of abdominal pain associated with bloody nausea and vomiting.  Patient reported epigastric pain started approximately 2 weeks ago which has been worse with eating.  Denies diarrhea, constipation and fever or chills.  Last alcohol use 3 days ago.  Normally drinks occasionally  but can drink up to 12 pack a day.  Other associated symptoms include weakness/faint.  Denies NSAID/ASA use, no history of GI bleeding in the past.  Patient denies dark stools      SLP Plan  Discharge SLP treatment due to no further skilled treatment warranted - All goals met       Recommendations  Diet recommendations: Dysphagia 3 (mechanical soft);Thin liquid Liquids provided via: Straw;Cup Medication Administration: Crushed with puree Supervision: Staff to assist with self feeding;Full supervision/cueing for compensatory strategies Compensations: Minimize  environmental distractions;Slow rate;Small sips/bites Postural Changes and/or Swallow Maneuvers: Seated upright 90 degrees;Upright 30-60 min after meal                Oral Care Recommendations: Oral care QID Follow up Recommendations: 24 hour supervision/assistance SLP Visit Diagnosis: Dysphagia, unspecified (R13.10) Plan: All goals met;Discharge SLP treatment due to (comment)       GO               [name] [facility], CCC-SLP Speech Language Pathologist [phone]  Shonna Chock 11/01/2017, 4:12 PM

## 2017-11-01 NOTE — Care Management Important Message (Signed)
Important Message  Patient Details  Name: LEFTY WOJICK MRN: [id number] Date of Birth: 04-28-1955   Medicare Important Message Given:  Yes    Caren Macadam 11/01/2017, 11:35 AMImportant Message  Patient Details  Name: FABIOLA MCKETHAN MRN: [id number] Date of Birth: 12-20-1955   Medicare Important Message Given:  Yes    Caren Macadam 11/01/2017, 11:35 AM

## 2017-11-01 NOTE — Progress Notes (Signed)
Physical Therapy Treatment Patient Details Name: [name] MRN: [id number] DOB: [birth year] Today's Date: 11/01/2017    History of Present Illness [name] is a 62 y.o. male with medical history significant of MS (follows with Dr. Epimenio Foot), bipolar disorder, alcohol and tobacco abuse, presented to the emergency department complaining of abdominal pain with nausea and vomiting.  Patient is poor historian.  He reports to 2-3 days of abdominal pain associated with nausea and vomiting.  Patient reported epigastric pain started approximately 2 weeks ago which has been worse with eating.  Denies diarrhea, constipation and fever or chills.  Last alcohol use 3 days ago.  Normally drinks occasionally but can drink up to 12 pack a day.  Other associated symptoms include weakness/faint.  Denies NSAID/ASA use, no history of GI bleeding in the past.  Patient denies dark stools.  Patient underwent EGD which revealed large, deep duodenal bulb ulcer.  Patient underwent empiric embolization by IR on 7/1.  Hospitalization further complicated by altered mental status.    PT Comments    Pt more limited today, likely d/t meds; he is cooperative with PT although  Mobility more limited this session; will continue to follow in acute setting;   Follow Up Recommendations  SNF     Equipment Recommendations  None recommended by PT    Recommendations for Other Services       Precautions / Restrictions Precautions Precautions: Fall Restrictions Weight Bearing Restrictions: No    Mobility  Bed Mobility Overal bed mobility: Needs Assistance Bed Mobility: Sit to Supine;Supine to Sit     Supine to sit: +2 for physical assistance;+2 for safety/equipment;Min assist;Mod assist     General bed mobility comments: assist with LEs and trunk  in both directions  Transfers Overall transfer level: Needs assistance Equipment used: Rolling walker (2 wheeled) Transfers: Sit to/from Stand Sit to Stand: Mod assist;+2  physical assistance;+2 safety/equipment         General transfer comment: multi-modal cues for wt shift, hand placement, trunk and hip extension  Ambulation/Gait Ambulation/Gait assistance: Mod assist;Min assist;+2 physical assistance;+2 safety/equipment   Assistive device: Rolling walker (2 wheeled)       General Gait Details: lateral steps along EOB with RW, cues for sequence   Stairs             Wheelchair Mobility    Modified Rankin (Stroke Patients Only)       Balance           Standing balance support: Bilateral upper extremity supported;Single extremity supported Standing balance-Leahy Scale: Poor Standing balance comment: reliant on UEs and  external support                            Cognition Arousal/Alertness: Awake/alert Behavior During Therapy: Flat affect Overall Cognitive Status: Impaired/Different from baseline Area of Impairment: Orientation;Attention;Following commands;Safety/judgement;Problem solving                 Orientation Level: Disoriented to;Place;Time;Situation Current Attention Level: Focused   Following Commands: Follows one step commands inconsistently;Follows multi-step commands inconsistently Safety/Judgement: Decreased awareness of safety;Decreased awareness of deficits   Problem Solving: Slow processing;Decreased initiation;Difficulty sequencing;Requires verbal cues;Requires tactile cues General Comments: pt medicated prior to PT, sleepy but arouses easily; verbalizes but cannot converse appropriately      Exercises      General Comments        Pertinent Vitals/Pain Pain Assessment: Faces Faces Pain Scale: Hurts a little  bit Pain Location: ? back, pt does not specify Pain Descriptors / Indicators: Discomfort Pain Intervention(s): Monitored during session    Home Living                      Prior Function            PT Goals (current goals can now be found in the care plan  section) Acute Rehab PT Goals Patient Stated Goal: can I get a cigarette PT Goal Formulation: With patient Time For Goal Achievement: 11/12/17 Potential to Achieve Goals: Good Progress towards PT goals: Progressing toward goals(limited today d/t meds)    Frequency    Min 3X/week      PT Plan Current plan remains appropriate    Co-evaluation              AM-PAC PT "6 Clicks" Daily Activity  Outcome Measure  Difficulty turning over in bed (including adjusting bedclothes, sheets and blankets)?: Unable Difficulty moving from lying on back to sitting on the side of the bed? : Unable Difficulty sitting down on and standing up from a chair with arms (e.g., wheelchair, bedside commode, etc,.)?: Unable Help needed moving to and from a bed to chair (including a wheelchair)?: A Lot Help needed walking in hospital room?: A Lot Help needed climbing 3-5 steps with a railing? : Total 6 Click Score: 8    End of Session Equipment Utilized During Treatment: Gait belt Activity Tolerance: Patient tolerated treatment well Patient left: in bed;with call bell/phone within reach;with bed alarm set;with restraints reapplied   PT Visit Diagnosis: Difficulty in walking, not elsewhere classified (R26.2);Unsteadiness on feet (R26.81)     Time: 7225-7505 PT Time Calculation (min) (ACUTE ONLY): 32 min  Charges:  $Therapeutic Activity: 23-37 mins                    G CodesDrucilla Chalet, PT Pager: [phone] 11/01/2017    Drucilla Chalet 11/01/2017, 1:50 PM

## 2017-11-01 NOTE — Progress Notes (Signed)
EEG complete. Results pending.  ?

## 2017-11-01 NOTE — Procedures (Signed)
History: 62 year old male being evaluated for encephalopathy  Sedation: Haldol, ativan  Technique: This is a 21 channel routine scalp EEG performed at the bedside with bipolar and monopolar montages arranged in accordance to the international 10/20 system of electrode placement. One channel was dedicated to EKG recording.    Background: There is a posterior dominant rhythm of 8 Hz which is well-formed.  There is also generalized irregular delta and theta activity throughout the recording.  There is a mild excess of beta activity which is most prominent anteriorly.  No epileptiform activity was seen.  Photic stimulation: Physiologic driving is not performed  EEG Abnormalities: 1) generalized irregular slow activity 2) excess beta activity  Clinical Interpretation: This EEG is consistent with a mild generalized nonspecific dysfunction (encephalopathy).  Though nonspecific, findings like this can be seen as a result of sedating medications.   There was no seizure or seizure predisposition recorded on this study. Please note that a normal EEG does not preclude the possibility of epilepsy.   Ritta Slot, MD [facility] [phone]  If 7pm- 7am, please page neurology on call as listed in AMION.

## 2017-11-01 NOTE — Consult Note (Signed)
[facility] Psych ED Progress Note  11/01/2017 10:17 PM [name]  MRN:  [id number] Subjective:  Writer followed up with pt this afternoon. His chart was reviewed prior to evaluation. CT scan - no acute intracranial pathology. Labs remain stable, QTC 453, EEG pending. Per nursing report pt slept better last night then before, however remains confused and combative. Received Haldol 5 mg Twice and Ativan 2 mg once and 1 mg last night since the last evaluation for agitation. On evaluation pt was laying on the bed, being fed by the nursing assistant. He was responded to his name, however unable to answer other questions and noted to mumble. He however was more calmer than yesterday and appeared to have more attention span.   Principal Problem: Symptomatic anemia Diagnosis:   Patient Active Problem List   Diagnosis Date Noted  . Acute encephalopathy [G93.40]   . Goals of care, counseling/discussion [Z71.89]   . Palliative care encounter [Z51.5]   . GI bleed [K92.2] 10/18/2017  . Symptomatic anemia [D64.9]   . Upper GI bleeding [K92.2] 10/16/2017  . Coccygeal pain, acute [M53.3] 04/08/2017  . Elevated TSH [R79.89] 04/06/2016  . Bipolar I disorder ([place]) [F31.9] 04/05/2016  . 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 Numbness [R20.0] 01/16/2016  . Alcohol abuse [F10.10] 07/16/2015  . Alcohol-induced mood disorder ([place]) [F10.94] 07/16/2015  . Other fatigue [R53.83] 06/29/2014  . Gait disturbance [R26.9] 06/29/2014  . Urinary urgency [R39.15] 06/29/2014  . High risk medication use [Z79.899] 06/29/2014  . Bipolar affective disorder, depressed, severe ([place]) [F31.4] 09/18/2012    Class: Chronic  . MS (multiple sclerosis) ([place]) [G35] 10/31/2011    Class: Chronic  . Dysthymia [F34.1] 05/11/2011   Total Time spent with patient: 15 minutes  Past Psychiatric History: As mentioned in initial consult note  Past Medical History:  Past Medical History:  Diagnosis Date  .  Depression   . Mental disorder   . Movement disorder   . MS (multiple sclerosis) ([place])   . Multiple sclerosis, relapsing-remitting ([place])   . Neuromuscular disorder ([place])   . Vision abnormalities     Past Surgical History:  Procedure Laterality Date  . BIOPSY  10/17/2017   Procedure: BIOPSY;  Surgeon: Otis Brace, MD;  Location: [facility];  Service: Gastroenterology;;  . ESOPHAGOGASTRODUODENOSCOPY (EGD) WITH PROPOFOL N/A 10/17/2017   Procedure: ESOPHAGOGASTRODUODENOSCOPY (EGD) WITH PROPOFOL;  Surgeon: Otis Brace, MD;  Location: [facility];  Service: Gastroenterology;  Laterality: N/A;  . IR ANGIOGRAM SELECTIVE EACH ADDITIONAL VESSEL  10/21/2017  . IR ANGIOGRAM SELECTIVE EACH ADDITIONAL VESSEL  10/21/2017  . IR ANGIOGRAM VISCERAL SELECTIVE  10/21/2017  . IR EMBO ART  VEN HEMORR LYMPH EXTRAV  INC GUIDE ROADMAPPING  10/21/2017  . IR US GUIDE VASC ACCESS RIGHT  10/21/2017  . TONSILLECTOMY     Family History:  Family History  Problem Relation Age of Onset  . Heart disease Mother   . Stroke Father    Family Psychiatric  History: As mentioned in initial consult note yesterday Social History:  Social History   Substance and Sexual Activity  Alcohol Use Yes  . Alcohol/week: 0.0 oz   Comment: 2-3 times a week.      Social History   Substance and Sexual Activity  Drug Use Yes  . Types: Marijuana   Comment: 1-2 times a month    Social History   Socioeconomic History  . Marital status: Legally Separated    Spouse name: Not on file  . Number  of children: Not on file  . Years of education: Not on file  . Highest education level: Not on file  Occupational History  . Not on file  Social Needs  . Financial resource strain: Not on file  . Food insecurity:    Worry: Not on file    Inability: Not on file  . Transportation needs:    Medical: Not on file    Non-medical: Not on file  Tobacco Use  . Smoking status: Current Every Day Smoker    Packs/day: 3.00    Years:  36.00    Pack years: 108.00    Types: Cigarettes  . Smokeless tobacco: Never Used  Substance and Sexual Activity  . Alcohol use: Yes    Alcohol/week: 0.0 oz    Comment: 2-3 times a week.   . Drug use: Yes    Types: Marijuana    Comment: 1-2 times a month  . Sexual activity: Not Currently    Birth control/protection: None  Lifestyle  . Physical activity:    Days per week: Not on file    Minutes per session: Not on file  . Stress: Not on file  Relationships  . Social connections:    Talks on phone: Not on file    Gets together: Not on file    Attends religious service: Not on file    Active member of club or organization: Not on file    Attends meetings of clubs or organizations: Not on file    Relationship status: Not on file  Other Topics Concern  . Not on file  Social History Narrative   ** Merged History Encounter **        Sleep: Has sleep disturbances  Appetite:  Poor  Current Medications: Current Facility-Administered Medications  Medication Dose Route Frequency Provider Last Rate Last Dose  . acetaminophen (TYLENOL) tablet 650 mg  650 mg Oral Q6H PRN Patrecia Pour, Christean Grief, MD   650 mg at 10/30/17 0253   Or  . acetaminophen (TYLENOL) suppository 650 mg  650 mg Rectal Q6H PRN Patrecia Pour, Christean Grief, MD      . Dimethyl Fumarate CPDR 240 mg  240 mg Oral BID Dessa Phi, DO      . feeding supplement (ENSURE ENLIVE) (ENSURE ENLIVE) liquid 237 mL  237 mL Oral TID BM Dessa Phi, DO   237 mL at 11/01/17 1950  . folic acid (FOLVITE) tablet 1 mg  1 mg Oral Daily Dessa Phi, DO   1 mg at 11/01/17 0957  . MEDLINE mouth rinse  15 mL Mouth Rinse BID Dessa Phi, DO   15 mL at 11/01/17 1010  . multivitamin with minerals tablet 1 tablet  1 tablet Oral Daily Ronnette Juniper, MD   1 tablet at 11/01/17 0957  . OLANZapine (ZYPREXA) injection 5 mg  5 mg Intramuscular Q8H PRN Orlene Erm, MD      . OLANZapine [facility]) tablet 2.5 mg  2.5 mg Oral QHS Alma Friendly, MD    2.5 mg at 10/31/17 2156  . ondansetron (ZOFRAN) tablet 4 mg  4 mg Oral Q6H PRN Patrecia Pour, Christean Grief, MD       Or  . ondansetron [facility]) injection 4 mg  4 mg Intravenous Q6H PRN Patrecia Pour, Christean Grief, MD   4 mg at 10/25/17 0026  . pantoprazole sodium (PROTONIX) 40 mg/20 mL oral suspension 40 mg  40 mg Per Tube BID AC Alma Friendly, MD   40 mg at 11/01/17 1734  .  polyethylene glycol (MIRALAX / GLYCOLAX) packet 17 g  17 g Oral Daily Dessa Phi, DO   17 g at 11/01/17 0957  . senna-docusate (Senokot-S) tablet 1 tablet  1 tablet Oral QHS Dessa Phi, DO   1 tablet at 10/31/17 2156  . sodium chloride flush (NS) 0.9 % injection 3 mL  3 mL Intravenous Q12H Patrecia Pour, Christean Grief, MD   3 mL at 10/28/17 2130  . thiamine (VITAMIN B-1) tablet 100 mg  100 mg Oral Daily Patrecia Pour, Christean Grief, MD   100 mg at 11/01/17 0957  . traZODone (DESYREL) tablet 25 mg  25 mg Oral QHS PRN Jani Gravel, MD   25 mg at 10/31/17 0208    Lab Results:  Results for orders placed or performed during the hospital encounter of 10/16/17 (from the past 48 hour(s))  CBC with Differential/Platelet     Status: Abnormal   Collection Time: 10/31/17  6:29 AM  Result Value Ref Range   WBC 3.8 (L) 4.0 - 10.5 K/uL   RBC 3.71 (L) 4.22 - 5.81 MIL/uL   Hemoglobin 11.5 (L) 13.0 - 17.0 g/dL   HCT 36.3 (L) 39.0 - 52.0 %   MCV 97.8 78.0 - 100.0 fL   MCH 31.0 26.0 - 34.0 pg   MCHC 31.7 30.0 - 36.0 g/dL   RDW 14.9 11.5 - 15.5 %   Platelets 352 150 - 400 K/uL   Neutrophils Relative % 66 %   Neutro Abs 2.5 1.7 - 7.7 K/uL   Lymphocytes Relative 22 %   Lymphs Abs 0.8 0.7 - 4.0 K/uL   Monocytes Relative 8 %   Monocytes Absolute 0.3 0.1 - 1.0 K/uL   Eosinophils Relative 3 %   Eosinophils Absolute 0.1 0.0 - 0.7 K/uL   Basophils Relative 1 %   Basophils Absolute 0.0 0.0 - 0.1 K/uL    Comment: Performed at [facility], [place] [address] [place], [place] [address]  Basic metabolic panel     Status: None   Collection Time:  10/31/17  6:29 AM  Result Value Ref Range   Sodium 142 135 - 145 mmol/L   Potassium 4.3 3.5 - 5.1 mmol/L   Chloride 111 98 - 111 mmol/L    Comment: Please note change in reference range.   CO2 23 22 - 32 mmol/L   Glucose, Bld 86 70 - 99 mg/dL    Comment: Please note change in reference range.   BUN 9 8 - 23 mg/dL    Comment: Please note change in reference range.   Creatinine, Ser 0.91 0.61 - 1.24 mg/dL   Calcium 8.9 8.9 - 10.3 mg/dL   GFR calc non Af Amer >60 >60 mL/min   GFR calc Af Amer >60 >60 mL/min    Comment: (NOTE) The eGFR has been calculated using the CKD EPI equation. This calculation has not been validated in all clinical situations. eGFR's persistently <60 mL/min signify possible Chronic Kidney Disease.    Anion gap 8 5 - 15    Comment: Performed at [facility], [place] [address] [place], [place] [address]  Urinalysis, Routine w reflex microscopic     Status: Abnormal   Collection Time: 11/01/17  2:08 AM  Result Value Ref Range   Color, Urine YELLOW YELLOW   APPearance CLEAR CLEAR   Specific Gravity, Urine 1.015 1.005 - 1.030   pH 6.0 5.0 - 8.0   Glucose, UA NEGATIVE NEGATIVE mg/dL   Hgb urine dipstick NEGATIVE NEGATIVE  Bilirubin Urine NEGATIVE NEGATIVE   Ketones, ur NEGATIVE NEGATIVE mg/dL   Protein, ur NEGATIVE NEGATIVE mg/dL   Nitrite POSITIVE (A) NEGATIVE   Leukocytes, UA NEGATIVE NEGATIVE   RBC / HPF 0-5 0 - 5 RBC/hpf   WBC, UA 0-5 0 - 5 WBC/hpf   Bacteria, UA FEW (A) NONE SEEN   Mucus PRESENT     Comment: Performed at [facility], [place] [address], [place], [place] [address]  CBC with Differential/Platelet     Status: Abnormal   Collection Time: 11/01/17  5:52 AM  Result Value Ref Range   WBC 3.9 (L) 4.0 - 10.5 K/uL   RBC 3.84 (L) 4.22 - 5.81 MIL/uL   Hemoglobin 11.9 (L) 13.0 - 17.0 g/dL   HCT 37.6 (L) 39.0 - 52.0 %   MCV 97.9 78.0 - 100.0 fL   MCH 31.0 26.0 - 34.0 pg   MCHC 31.6 30.0 - 36.0 g/dL   RDW 14.8  11.5 - 15.5 %   Platelets 395 150 - 400 K/uL   Neutrophils Relative % 67 %   Neutro Abs 2.7 1.7 - 7.7 K/uL   Lymphocytes Relative 21 %   Lymphs Abs 0.8 0.7 - 4.0 K/uL   Monocytes Relative 8 %   Monocytes Absolute 0.3 0.1 - 1.0 K/uL   Eosinophils Relative 3 %   Eosinophils Absolute 0.1 0.0 - 0.7 K/uL   Basophils Relative 1 %   Basophils Absolute 0.0 0.0 - 0.1 K/uL    Comment: Performed at [facility], [place] [address], [place], [place] [address]  Basic metabolic panel     Status: None   Collection Time: 11/01/17  5:52 AM  Result Value Ref Range   Sodium 142 135 - 145 mmol/L   Potassium 4.1 3.5 - 5.1 mmol/L   Chloride 109 98 - 111 mmol/L    Comment: Please note change in reference range.   CO2 24 22 - 32 mmol/L   Glucose, Bld 86 70 - 99 mg/dL    Comment: Please note change in reference range.   BUN 10 8 - 23 mg/dL    Comment: Please note change in reference range.   Creatinine, Ser 1.00 0.61 - 1.24 mg/dL   Calcium 9.2 8.9 - 10.3 mg/dL   GFR calc non Af Amer >60 >60 mL/min   GFR calc Af Amer >60 >60 mL/min    Comment: (NOTE) The eGFR has been calculated using the CKD EPI equation. This calculation has not been validated in all clinical situations. eGFR's persistently <60 mL/min signify possible Chronic Kidney Disease.    Anion gap 9 5 - 15    Comment: Performed at [facility], [place] [address] [place],  [address]    Blood Alcohol level:  Lab Results  Component Value Date   ETH <5 09/23/2016   ETH 67 (H) 04/24/2016    Physical Findings: AIMS:  , ,  ,  ,    CIWA:  CIWA-Ar Total: 7 COWS:     Musculoskeletal: Strength & Muscle Tone: decreased Gait & Station: Unable to assess, laying in bed Patient leans: N/A  Psychiatric Specialty Exam: Physical Exam  ROS  Blood pressure 104/67, pulse 78, temperature 97.8 F (36.6 C), resp. rate 20, height '5\' 11"'  (1.803 m), weight 68.8 kg (151 lb 10.8 oz), SpO2 100 %.Body mass index is  21.15 kg/m.   General Appearance: Disheveled  Eye Contact:  Poor  Speech:  Garbled  Volume:  Decreased  Mood:  calm  Affect:  Blunt  Thought Process:  Disorganized  Orientation:  Other: to self  Thought Content:  Did not appear internally stimulated today  Suicidal Thoughts:  Unable to assess as pt not able to answer to questions  Homicidal Thoughts:  Unable to assess as pt not able to answer to questions  Memory:  Unable to assess as pt not able to answer to questions  Judgement:  Other:  Unable to assess as pt not able to answer to questions  Insight:  Unable to assess as pt not able to answer to questions  Psychomotor Activity:  Restlessness  Concentration:  Concentration: Poor and Attention Span: Poor  Recall:  Unable to assess as pt not able to answer to questions  Fund of Knowledge:  Unable to assess as pt not able to answer to questions  Language:  Unable to assess as pt not able to answer to questions  Akathisia:  No    AIMS (if indicated):     Assets:  Others:  Unable to assess as pt not able to answer to questions  ADL's:  Impaired  Cognition:  Impaired,  Severe  Sleep:      Assessment:  - Pt with hx of bipolar disorder, MS, currently admitted to [facility] for symptomatic anemia in the context of GI bleed - Currently off of psychiatric meds as fam reported that pt was non compliant prior to admission and meds include Wellbutrin, Lithium, Lexapro and Abilify  - Pt appears delirious given fluctuating consciousness/attention, impaired cogntive functions, responding to internal stimuli speech and sleep disturbances, and frequently agitated on evaluation. - Appears to be improving, more calmer, longer attention span, and not internally stimulated. - Presentation doesn't appear to be in the context of manic or psychotic decompensation.   Treatment Plan Summary:  Would recommend following.  - Increase Zyprexa 2.5 mg QHS to 2.5 mg BID - Switch Haldol 5 mg Q6hrs PRN PO/IM for  agitation to Zyprexa 5 mg Q8hrs PRN PO/IM for agitation - Recommending stopping Ativan 1 mg QHS PRN as it can worsen the delirium. - Daily EKG, QTC today 453 - EEG pending - Will continue to follow up - recommendation discussed with hospitalist on case    Orlene Erm, MD 11/01/2017, 10:17 PM

## 2017-11-02 ENCOUNTER — Inpatient Hospital Stay (HOSPITAL_COMMUNITY): Payer: Medicare HMO

## 2017-11-02 DIAGNOSIS — F1290 Cannabis use, unspecified, uncomplicated: Secondary | ICD-10-CM

## 2017-11-02 DIAGNOSIS — R451 Restlessness and agitation: Secondary | ICD-10-CM

## 2017-11-02 LAB — URINE CULTURE

## 2017-11-02 LAB — CBC WITH DIFFERENTIAL/PLATELET
Basophils Absolute: 0 10*3/uL (ref 0.0–0.1)
Basophils Relative: 0 %
Eosinophils Absolute: 0.1 10*3/uL (ref 0.0–0.7)
Eosinophils Relative: 2 %
HCT: 35.7 % — ABNORMAL LOW (ref 39.0–52.0)
Hemoglobin: 11.3 g/dL — ABNORMAL LOW (ref 13.0–17.0)
Lymphocytes Relative: 20 %
Lymphs Abs: 0.8 10*3/uL (ref 0.7–4.0)
MCH: 30.8 pg (ref 26.0–34.0)
MCHC: 31.7 g/dL (ref 30.0–36.0)
MCV: 97.3 fL (ref 78.0–100.0)
Monocytes Absolute: 0.5 10*3/uL (ref 0.1–1.0)
Monocytes Relative: 12 %
Neutro Abs: 2.6 10*3/uL (ref 1.7–7.7)
Neutrophils Relative %: 66 %
Platelets: 378 10*3/uL (ref 150–400)
RBC: 3.67 MIL/uL — ABNORMAL LOW (ref 4.22–5.81)
RDW: 14.7 % (ref 11.5–15.5)
WBC: 3.9 10*3/uL — ABNORMAL LOW (ref 4.0–10.5)

## 2017-11-02 LAB — BASIC METABOLIC PANEL
Anion gap: 8 (ref 5–15)
BUN: 15 mg/dL (ref 8–23)
CO2: 26 mmol/L (ref 22–32)
Calcium: 9.1 mg/dL (ref 8.9–10.3)
Chloride: 109 mmol/L (ref 98–111)
Creatinine, Ser: 1.15 mg/dL (ref 0.61–1.24)
GFR calc Af Amer: >60 mL/min (ref >60)
GFR calc non Af Amer: >60 mL/min (ref >60)
Glucose, Bld: 92 mg/dL (ref 70–99)
Potassium: 4.1 mmol/L (ref 3.5–5.1)
Sodium: 143 mmol/L (ref 135–145)

## 2017-11-02 MED ORDER — VITAMIN B-12 1000 MCG PO TABS
1000.0000 ug | ORAL_TABLET | Freq: Every day | ORAL | Status: DC
  Administered 2017-11-02 – 2017-11-11 (×6): 1000 ug via ORAL
  Filled 2017-11-02 (×8): qty 1

## 2017-11-02 MED ORDER — OLANZAPINE 5 MG PO TABS
5.0000 mg | ORAL_TABLET | Freq: Two times a day (BID) | ORAL | Status: DC
  Administered 2017-11-02 – 2017-11-05 (×5): 5 mg via ORAL
  Filled 2017-11-02 (×6): qty 1

## 2017-11-02 NOTE — Progress Notes (Signed)
PROGRESS NOTE  AVANISH WIEMKEN YWV:[id number] DOB: 12-08-1955 DOA: 10/16/2017 PCP: Rinaldo Cloud, MD  HPI/Recap of past 42 hours: 62 year old male with medical history significant for MS, bipolar disorder, alcohol/tobacco abuse, presented to the ER complaining of abdominal pain, nausea/vomiting for the past 2 weeks.  Patient denied any NSAID/aspirin use, no history of GI bleeding in the past.  Patient underwent EGD which revealed a large, deep duodenal bulb ulcer.  Patient underwent empiric embolization by IR on 7/1.  Hospitalization further complicated by acute encephalopathy.  Today, patient noted to be more awake, still has his dark glasses on, noted to be hallucinating with intermittent yelling. Unable to have meaningful conversation.  Assessment/Plan: Principal Problem:   Symptomatic anemia Active Problems:   MS (multiple sclerosis) (HCC)   Bipolar affective disorder, depressed, severe (H[facility])   Bipolar I disorder (H[facility])   Upper GI bleeding   GI bleed   Acute encephalopathy   Goals of care, counseling/discussion   Palliative care encounter  Upper GI bleed due to duodenal bulb ulcer Stable Likely due to chronic alcohol abuse Positive FOBT, with hemoglobin drop GI consulted, status post EGD on 6/27 showed very large deep duodenal bulb ulcer, LA grade D esophagitis IR consulted, status post empiric embolization on 10/21/2017 Continue PPI 40 mg twice daily for 8 weeks, then 40 mg daily indefinitely Need repeat EGD in 8 weeks to document healing of esophagitis as well as large duodenal bulb ulcer.  Will follow up with GI in 6 weeks  Acute blood loss anemia Stable Likely due to above Hemoglobin at baseline 14-15, 10.1 on admission Status post 2 units of packed RBC on 7/4 Anemia panel showed iron of 77, ferritin of 122, vitamin B12 180, folate 11.4 Vitamin B12 on the low normal side, status post 1 dose of B12 given, continue PO daily  Acute metabolic encephalopathy Unclear etiology,  waxing and waning, likely ?acute delirium No current signs of infection, CXR negative UA with positive nitrite only, UC showed multiple species, will repeat CT head negative for any acute intracranial abnormalities MRI negative for any acute changes, unchanged from 2017 EEG consistent with mild generalized nonspecific dysfunction, encephalopathy.  No seizure or seizure predisposition recorded on the study Consulted psych, appreciate rec for med changes, start zyprexa discontinue haldol and ativan Will order RPR, pending Monitor closely, fall precautions  Multiple sclerosis Followed by neurology as an outpatient On home tecfidera, pharmacy does not stock medication, patient has to bring home meds  Bipolar disorder Unsure if patient is on any meds at home Psychiatry consulted, likely delirium, unlikely manic episode from bipolar, rec med adjustment as mentioned above  Alcohol abuse Last alcoholic drink was over 1 week ago Counseled against     Code Status: Full  Family Communication: None at bedside  Disposition Plan: SNF   Consultants:  GI  IR  Psychiatry  Procedures:  EGD  Embolization by IR  Antimicrobials:  None  DVT prophylaxis: SCDs   Objective: Vitals:   11/01/17 2109 11/02/17 0500 11/02/17 0712 11/02/17 1409  BP: 104/67  109/76 (!) 142/95  Pulse: 78  76   Resp: 20  20   Temp: 97.8 F (36.6 C)  98.3 F (36.8 C)   TempSrc:      SpO2:      Weight:  69.2 kg (152 lb 8.9 oz)    Height:        Intake/Output Summary (Last 24 hours) at 11/02/2017 1546 Last data filed at 11/02/2017 0600 Gross per  24 hour  Intake -  Output 325 ml  Net -325 ml   Filed Weights   10/29/17 2100 10/31/17 0459 11/02/17 0500  Weight: 72.5 kg (159 lb 13.3 oz) 68.8 kg (151 lb 10.8 oz) 69.2 kg (152 lb 8.9 oz)    Exam:   General: NAD, still on restraints  Cardiovascular: S1, S2 present  Respiratory: CTA B  Abdomen: Soft, nontender, nondistended, bowel sounds  present  Musculoskeletal: No pedal edema bilaterally  Skin: Normal  Psychiatry: Unable to assess, hallucinating, intermittently agitated   Data Reviewed: CBC: Recent Labs  Lab 10/28/17 0532 10/30/17 0607 10/31/17 0629 11/01/17 0552 11/02/17 0514  WBC 4.6 3.5* 3.8* 3.9* 3.9*  NEUTROABS  --   --  2.5 2.7 2.6  HGB 10.3* 10.4* 11.5* 11.9* 11.3*  HCT 31.5* 32.0* 36.3* 37.6* 35.7*  MCV 96.6 96.1 97.8 97.9 97.3  PLT 296 359 352 395 378   Basic Metabolic Panel: Recent Labs  Lab 10/27/17 0451 10/28/17 0532 10/30/17 0607 10/31/17 0629 11/01/17 0552 11/02/17 0514  NA 142 145 141 142 142 143  K 4.0 4.1 3.9 4.3 4.1 4.1  CL 115* 115* 109 111 109 109  CO2 20* 22 25 23 24 26   GLUCOSE 87 100* 113* 86 86 92  BUN 10 14 15 9 10 15   CREATININE 0.99 0.96 1.01 0.91 1.00 1.15  CALCIUM 8.0* 8.4* 8.7* 8.9 9.2 9.1  MG 2.1 2.1  --   --   --   --   PHOS 3.0 3.5  --   --   --   --    GFR: Estimated Creatinine Clearance: 65.2 mL/min (by C-G formula based on SCr of 1.15 mg/dL). Liver Function Tests: No results for input(s): AST, ALT, ALKPHOS, BILITOT, PROT, ALBUMIN in the last 168 hours. No results for input(s): LIPASE, AMYLASE in the last 168 hours. No results for input(s): AMMONIA in the last 168 hours. Coagulation Profile: No results for input(s): INR, PROTIME in the last 168 hours. Cardiac Enzymes: No results for input(s): CKTOTAL, CKMB, CKMBINDEX, TROPONINI in the last 168 hours. BNP (last 3 results) No results for input(s): PROBNP in the last 8760 hours. HbA1C: No results for input(s): HGBA1C in the last 72 hours. CBG: Recent Labs  Lab 10/26/17 1556 10/27/17 2034  GLUCAP 94 121*   Lipid Profile: No results for input(s): CHOL, HDL, LDLCALC, TRIG, CHOLHDL, LDLDIRECT in the last 72 hours. Thyroid Function Tests: No results for input(s): TSH, T4TOTAL, FREET4, T3FREE, THYROIDAB in the last 72 hours. Anemia Panel: No results for input(s): VITAMINB12, FOLATE, FERRITIN, TIBC,  IRON, RETICCTPCT in the last 72 hours. Urine analysis:    Component Value Date/Time   COLORURINE YELLOW 11/01/2017 0208   APPEARANCEUR CLEAR 11/01/2017 0208   LABSPEC 1.015 11/01/2017 0208   PHURINE 6.0 11/01/2017 0208   GLUCOSEU NEGATIVE 11/01/2017 0208   HGBUR NEGATIVE 11/01/2017 0208   BILIRUBINUR NEGATIVE 11/01/2017 0208   KETONESUR NEGATIVE 11/01/2017 0208   PROTEINUR NEGATIVE 11/01/2017 0208   UROBILINOGEN 0.2 07/08/2013 0930   NITRITE POSITIVE (A) 11/01/2017 0208   LEUKOCYTESUR NEGATIVE 11/01/2017 0208   Sepsis Labs: @LABRCNTIP (procalcitonin:4,lacticidven:4)  ) Recent Results (from the past 240 hour(s))  Urine Culture     Status: Abnormal   Collection Time: 11/01/17  8:50 AM  Result Value Ref Range Status   Specimen Description   Final    URINE, RANDOM Performed at [facility], [address] [place], [place] [address]    Special Requests   Final  NONE Performed at [facility], [address] [place], [place] [address]    Culture MULTIPLE SPECIES PRESENT, SUGGEST RECOLLECTION (A)  Final   Report Status 11/02/2017 FINAL  Final      Studies: Mr Brain Wo Contrast  Result Date: 11/02/2017 CLINICAL DATA:  Encephalopathy. Confusion and agitation. History of multiple sclerosis. EXAM: MRI HEAD WITHOUT CONTRAST TECHNIQUE: Multiplanar, multiecho pulse sequences of the brain and surrounding structures were obtained without intravenous contrast. COMPARISON:  CT 10/31/2017.  MRI 08/11/2015. FINDINGS: Brain: Diffusion imaging does not show any acute or subacute infarction or other cause of restricted diffusion. The brainstem is normal. There are a few old small vessel cerebellar infarctions. Cerebral hemispheres show patchy and confluent abnormal T2 and FLAIR signal affecting the cerebral hemispheric deep and subcortical white matter. This is consistent with chronic small vessel ischemic changes. Certainly, there could be coexistent  demyelinating disease. Compared to the study of 2017 the findings appear quite similar. There is an old small cortical and subcortical infarction in the right posterior parietal region. Involvement of the corpus callosum appears similar to the previous exam. Vascular: Major vessels at the base of the brain show flow. Skull and upper cervical spine: Negative Sinuses/Orbits: Clear/normal Other: None IMPRESSION: No acute finding by MRI. No visible change since the study of 2017. Widespread abnormal T2 and FLAIR signal within the cerebral hemispheric deep and subcortical white matter that could be a combination of small-vessel disease and demyelinating disease. None of the abnormalities shows restricted diffusion. 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 Electronically Signed   By: Paulina Fusi M.D.   On: 11/02/2017 13:44    Scheduled Meds: . Dimethyl Fumarate  240 mg Oral BID  . feeding supplement (ENSURE ENLIVE)  237 mL Oral TID BM  . folic acid  1 mg Oral Daily  . mouth rinse  15 mL Mouth Rinse BID  . multivitamin with minerals  1 tablet Oral Daily  . OLANZapine  5 mg Oral BID  . pantoprazole sodium  40 mg Per Tube BID AC  . polyethylene glycol  17 g Oral Daily  . senna-docusate  1 tablet Oral QHS  . sodium chloride flush  3 mL Intravenous Q12H  . thiamine  100 mg Oral Daily    Continuous Infusions:   LOS: 15 days     Briant Cedar, MD [facility]   If 7PM-7AM, please contact night-coverage [email or web address] Password [facility] 11/02/2017, 3:46 PM

## 2017-11-02 NOTE — Consult Note (Signed)
[facility] Psych ED Progress Note  11/02/2017 10:21 AM KEVAUGHN EWING  MRN:  [id number] Subjective:  Writer followed up with pt this afternoon. His chart was reviewed prior to evaluation. CT scan - no acute intracranial pathology. Labs remain stable, QTC 453, EEG pending. Per nursing report pt slept better last night then before, however remains confused and combative. Received Haldol 5 mg Twice and Ativan 2 mg once and 1 mg last night since the last evaluation for agitation. On evaluation pt was laying on the bed, being fed by the nursing assistant. He was responded to his name, however unable to answer other questions and noted to mumble. He however was more calmer than yesterday and appeared to have more attention span.   November 02, 2017  Patient seen today for psychiatric consultation follow-up also reviewed initial consultation as above..  Patient continued to be actively psychotic, incoherent, hallucinating reportedly 5 or 7 people are in his room and also talking as if he is responding to the internal stimuli.  Patient stated I am doing fine but everybody else is in trouble.  Staff reported he has been yelling, talking to himself and also hallucinating throughout this morning.  Patient was placed on soft restraints and upper extremities and also wearing dark glasses today.  Patient has been taking his medication without adverse effects.  Principal Problem: Symptomatic anemia Diagnosis:   Patient Active Problem List   Diagnosis Date Noted  . Acute encephalopathy [G93.40]   . Goals of care, counseling/discussion [Z71.89]   . Palliative care encounter [Z51.5]   . GI bleed [K92.2] 10/18/2017  . Symptomatic anemia [D64.9]   . Upper GI bleeding [K92.2] 10/16/2017  . Coccygeal pain, acute [M53.3] 04/08/2017  . Elevated TSH [R79.89] 04/06/2016  . Bipolar I disorder ([place]) [F31.9] 04/05/2016  . 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 Numbness [R20.0] 01/16/2016  . Alcohol abuse  [F10.10] 07/16/2015  . Alcohol-induced mood disorder ([place]) [F10.94] 07/16/2015  . Other fatigue [R53.83] 06/29/2014  . Gait disturbance [R26.9] 06/29/2014  . Urinary urgency [R39.15] 06/29/2014  . High risk medication use [Z79.899] 06/29/2014  . Bipolar affective disorder, depressed, severe ([place]) [F31.4] 09/18/2012    Class: Chronic  . MS (multiple sclerosis) ([place]) [G35] 10/31/2011    Class: Chronic  . Dysthymia [F34.1] 05/11/2011   Total Time spent with patient: 15 minutes  Past Psychiatric History: As mentioned in initial consult note  Past Medical History:  Past Medical History:  Diagnosis Date  . Depression   . Mental disorder   . Movement disorder   . MS (multiple sclerosis) ([place])   . Multiple sclerosis, relapsing-remitting ([place])   . Neuromuscular disorder ([place])   . Vision abnormalities     Past Surgical History:  Procedure Laterality Date  . BIOPSY  10/17/2017   Procedure: BIOPSY;  Surgeon: Otis Brace, MD;  Location: [facility];  Service: Gastroenterology;;  . ESOPHAGOGASTRODUODENOSCOPY (EGD) WITH PROPOFOL N/A 10/17/2017   Procedure: ESOPHAGOGASTRODUODENOSCOPY (EGD) WITH PROPOFOL;  Surgeon: Otis Brace, MD;  Location: [facility];  Service: Gastroenterology;  Laterality: N/A;  . IR ANGIOGRAM SELECTIVE EACH ADDITIONAL VESSEL  10/21/2017  . IR ANGIOGRAM SELECTIVE EACH ADDITIONAL VESSEL  10/21/2017  . IR ANGIOGRAM VISCERAL SELECTIVE  10/21/2017  . IR EMBO ART  VEN HEMORR LYMPH EXTRAV  INC GUIDE ROADMAPPING  10/21/2017  . IR US GUIDE VASC ACCESS RIGHT  10/21/2017  . TONSILLECTOMY     Family History:  Family History  Problem Relation Age of Onset  .  Heart disease Mother   . Stroke Father    Family Psychiatric  History: As mentioned in initial consult note yesterday Social History:  Social History   Substance and Sexual Activity  Alcohol Use Yes  . Alcohol/week: 0.0 oz   Comment: 2-3 times a week.      Social History   Substance and Sexual Activity  Drug  Use Yes  . Types: Marijuana   Comment: 1-2 times a month    Social History   Socioeconomic History  . Marital status: Legally Separated    Spouse name: Not on file  . Number of children: Not on file  . Years of education: Not on file  . Highest education level: Not on file  Occupational History  . Not on file  Social Needs  . Financial resource strain: Not on file  . Food insecurity:    Worry: Not on file    Inability: Not on file  . Transportation needs:    Medical: Not on file    Non-medical: Not on file  Tobacco Use  . Smoking status: Current Every Day Smoker    Packs/day: 3.00    Years: 36.00    Pack years: 108.00    Types: Cigarettes  . Smokeless tobacco: Never Used  Substance and Sexual Activity  . Alcohol use: Yes    Alcohol/week: 0.0 oz    Comment: 2-3 times a week.   . Drug use: Yes    Types: Marijuana    Comment: 1-2 times a month  . Sexual activity: Not Currently    Birth control/protection: None  Lifestyle  . Physical activity:    Days per week: Not on file    Minutes per session: Not on file  . Stress: Not on file  Relationships  . Social connections:    Talks on phone: Not on file    Gets together: Not on file    Attends religious service: Not on file    Active member of club or organization: Not on file    Attends meetings of clubs or organizations: Not on file    Relationship status: Not on file  Other Topics Concern  . Not on file  Social History Narrative   ** Merged History Encounter **        Sleep: Has sleep disturbances  Appetite:  Poor  Current Medications: Current Facility-Administered Medications  Medication Dose Route Frequency Provider Last Rate Last Dose  . acetaminophen (TYLENOL) tablet 650 mg  650 mg Oral Q6H PRN Patrecia Pour, Christean Grief, MD   650 mg at 10/30/17 0253   Or  . acetaminophen (TYLENOL) suppository 650 mg  650 mg Rectal Q6H PRN Patrecia Pour, Christean Grief, MD      . Dimethyl Fumarate CPDR 240 mg  240 mg Oral BID Dessa Phi, DO      . feeding supplement (ENSURE ENLIVE) (ENSURE ENLIVE) liquid 237 mL  237 mL Oral TID BM Dessa Phi, DO   237 mL at 11/01/17 1950  . folic acid (FOLVITE) tablet 1 mg  1 mg Oral Daily Dessa Phi, DO   1 mg at 11/01/17 0957  . MEDLINE mouth rinse  15 mL Mouth Rinse BID Dessa Phi, DO   15 mL at 11/01/17 2221  . multivitamin with minerals tablet 1 tablet  1 tablet Oral Daily Ronnette Juniper, MD   1 tablet at 11/01/17 0957  . OLANZapine (ZYPREXA) injection 5 mg  5 mg Intramuscular Q8H PRN Orlene Erm, MD      .  OLANZapine (ZYPREXA) tablet 2.5 mg  2.5 mg Oral BID Orlene Erm, MD      . ondansetron [facility]) tablet 4 mg  4 mg Oral Q6H PRN Patrecia Pour, Christean Grief, MD       Or  . ondansetron [facility]) injection 4 mg  4 mg Intravenous Q6H PRN Patrecia Pour, Christean Grief, MD   4 mg at 10/25/17 0026  . pantoprazole sodium (PROTONIX) 40 mg/20 mL oral suspension 40 mg  40 mg Per Tube BID AC Alma Friendly, MD   40 mg at 11/01/17 1734  . polyethylene glycol (MIRALAX / GLYCOLAX) packet 17 g  17 g Oral Daily Dessa Phi, DO   17 g at 11/01/17 0957  . senna-docusate (Senokot-S) tablet 1 tablet  1 tablet Oral QHS Dessa Phi, DO   1 tablet at 11/01/17 2222  . sodium chloride flush (NS) 0.9 % injection 3 mL  3 mL Intravenous Q12H Patrecia Pour, Christean Grief, MD   3 mL at 10/28/17 2130  . thiamine (VITAMIN B-1) tablet 100 mg  100 mg Oral Daily Patrecia Pour, Christean Grief, MD   100 mg at 11/01/17 0957  . traZODone (DESYREL) tablet 25 mg  25 mg Oral QHS PRN Jani Gravel, MD   25 mg at 11/01/17 2316    Lab Results:  Results for orders placed or performed during the hospital encounter of 10/16/17 (from the past 48 hour(s))  Urinalysis, Routine w reflex microscopic     Status: Abnormal   Collection Time: 11/01/17  2:08 AM  Result Value Ref Range   Color, Urine YELLOW YELLOW   APPearance CLEAR CLEAR   Specific Gravity, Urine 1.015 1.005 - 1.030   pH 6.0 5.0 - 8.0   Glucose, UA NEGATIVE NEGATIVE mg/dL    Hgb urine dipstick NEGATIVE NEGATIVE   Bilirubin Urine NEGATIVE NEGATIVE   Ketones, ur NEGATIVE NEGATIVE mg/dL   Protein, ur NEGATIVE NEGATIVE mg/dL   Nitrite POSITIVE (A) NEGATIVE   Leukocytes, UA NEGATIVE NEGATIVE   RBC / HPF 0-5 0 - 5 RBC/hpf   WBC, UA 0-5 0 - 5 WBC/hpf   Bacteria, UA FEW (A) NONE SEEN   Mucus PRESENT     Comment: Performed at [facility], [place] [address], [place], [place] [address]  CBC with Differential/Platelet     Status: Abnormal   Collection Time: 11/01/17  5:52 AM  Result Value Ref Range   WBC 3.9 (L) 4.0 - 10.5 K/uL   RBC 3.84 (L) 4.22 - 5.81 MIL/uL   Hemoglobin 11.9 (L) 13.0 - 17.0 g/dL   HCT 37.6 (L) 39.0 - 52.0 %   MCV 97.9 78.0 - 100.0 fL   MCH 31.0 26.0 - 34.0 pg   MCHC 31.6 30.0 - 36.0 g/dL   RDW 14.8 11.5 - 15.5 %   Platelets 395 150 - 400 K/uL   Neutrophils Relative % 67 %   Neutro Abs 2.7 1.7 - 7.7 K/uL   Lymphocytes Relative 21 %   Lymphs Abs 0.8 0.7 - 4.0 K/uL   Monocytes Relative 8 %   Monocytes Absolute 0.3 0.1 - 1.0 K/uL   Eosinophils Relative 3 %   Eosinophils Absolute 0.1 0.0 - 0.7 K/uL   Basophils Relative 1 %   Basophils Absolute 0.0 0.0 - 0.1 K/uL    Comment: Performed at [facility], [place] [address], [place], [place] [address]  Basic metabolic panel     Status: None   Collection Time: 11/01/17  5:52 AM  Result  Value Ref Range   Sodium 142 135 - 145 mmol/L   Potassium 4.1 3.5 - 5.1 mmol/L   Chloride 109 98 - 111 mmol/L    Comment: Please note change in reference range.   CO2 24 22 - 32 mmol/L   Glucose, Bld 86 70 - 99 mg/dL    Comment: Please note change in reference range.   BUN 10 8 - 23 mg/dL    Comment: Please note change in reference range.   Creatinine, Ser 1.00 0.61 - 1.24 mg/dL   Calcium 9.2 8.9 - 10.3 mg/dL   GFR calc non Af Amer >60 >60 mL/min   GFR calc Af Amer >60 >60 mL/min    Comment: (NOTE) The eGFR has been calculated using the CKD EPI equation. This calculation  has not been validated in all clinical situations. eGFR's persistently <60 mL/min signify possible Chronic Kidney Disease.    Anion gap 9 5 - 15    Comment: Performed at [facility], [place] [address] [place], [place] [address]  CBC with Differential/Platelet     Status: Abnormal   Collection Time: 11/02/17  5:14 AM  Result Value Ref Range   WBC 3.9 (L) 4.0 - 10.5 K/uL   RBC 3.67 (L) 4.22 - 5.81 MIL/uL   Hemoglobin 11.3 (L) 13.0 - 17.0 g/dL   HCT 35.7 (L) 39.0 - 52.0 %   MCV 97.3 78.0 - 100.0 fL   MCH 30.8 26.0 - 34.0 pg   MCHC 31.7 30.0 - 36.0 g/dL   RDW 14.7 11.5 - 15.5 %   Platelets 378 150 - 400 K/uL   Neutrophils Relative % 66 %   Neutro Abs 2.6 1.7 - 7.7 K/uL   Lymphocytes Relative 20 %   Lymphs Abs 0.8 0.7 - 4.0 K/uL   Monocytes Relative 12 %   Monocytes Absolute 0.5 0.1 - 1.0 K/uL   Eosinophils Relative 2 %   Eosinophils Absolute 0.1 0.0 - 0.7 K/uL   Basophils Relative 0 %   Basophils Absolute 0.0 0.0 - 0.1 K/uL    Comment: Performed at [facility], [place] [address], [place],  [address]  Basic metabolic panel     Status: None   Collection Time: 11/02/17  5:14 AM  Result Value Ref Range   Sodium 143 135 - 145 mmol/L   Potassium 4.1 3.5 - 5.1 mmol/L   Chloride 109 98 - 111 mmol/L    Comment: Please note change in reference range.   CO2 26 22 - 32 mmol/L   Glucose, Bld 92 70 - 99 mg/dL    Comment: Please note change in reference range.   BUN 15 8 - 23 mg/dL    Comment: Please note change in reference range.   Creatinine, Ser 1.15 0.61 - 1.24 mg/dL   Calcium 9.1 8.9 - 10.3 mg/dL   GFR calc non Af Amer >60 >60 mL/min   GFR calc Af Amer >60 >60 mL/min    Comment: (NOTE) The eGFR has been calculated using the CKD EPI equation. This calculation has not been validated in all clinical situations. eGFR's persistently <60 mL/min signify possible Chronic Kidney Disease.    Anion gap 8 5 - 15    Comment: Performed at [facility], [place] [address], [place],  [address]    Blood Alcohol level:  Lab Results  Component Value Date   ETH <5 09/23/2016   ETH 67 (H) 04/24/2016    Physical Findings: AIMS:  , ,  ,  ,  CIWA:  CIWA-Ar Total: 7 COWS:     Musculoskeletal: Strength & Muscle Tone: decreased Gait & Station: Unable to assess, laying in bed Patient leans: N/A  Psychiatric Specialty Exam: Physical Exam  ROS  Blood pressure 109/76, pulse 76, temperature 98.3 F (36.8 C), resp. rate 20, height '5\' 11"'  (1.803 m), weight 69.2 kg (152 lb 8.9 oz), SpO2 100 %.Body mass index is 21.28 kg/m.   General Appearance: Disheveled  Eye Contact:  Poor  Speech:  Garbled  Volume:  Decreased  Mood:  calm  Affect:  Blunt  Thought Process:  Disorganized  Orientation:  Other: to self  Thought Content:  Did not appear internally stimulated today  Suicidal Thoughts:  Unable to assess as pt not able to answer to questions  Homicidal Thoughts:  Unable to assess as pt not able to answer to questions  Memory:  Unable to assess as pt not able to answer to questions  Judgement:  Other:  Unable to assess as pt not able to answer to questions  Insight:  Unable to assess as pt not able to answer to questions  Psychomotor Activity:  Restlessness  Concentration:  Concentration: Poor and Attention Span: Poor  Recall:  Unable to assess as pt not able to answer to questions  Fund of Knowledge:  Unable to assess as pt not able to answer to questions  Language:  Unable to assess as pt not able to answer to questions  Akathisia:  No    AIMS (if indicated):     Assets:  Others:  Unable to assess as pt not able to answer to questions  ADL's:  Impaired  Cognition:  Impaired,  Severe  Sleep:      Assessment: Patient continued to be psychotic, incoherent, responded to the internal stimuli and limited improvement with his current medication. - Pt with hx of bipolar disorder, MS, currently admitted to [facility] for  symptomatic anemia in the context of GI bleed - Currently off of psychiatric meds as family reported that pt was non compliant prior to admission and meds include Wellbutrin, Lithium, Lexapro and Abilify  - Pt appears delirious given fluctuating consciousness/attention, impaired cogntive functions, responding to internal stimuli speech and sleep disturbances, and frequently agitated on evaluation. - Appears to be improving, more calmer, longer attention span, and not internally stimulated. - Presentation doesn't appear to be in the context of manic or psychotic decompensation.   Treatment Plan Summary:  Would recommend following.  - Increase Zyprexa 5 mg BID -Discontinue Haldol as needed  - Continue Zyprexa 5 mg Q8hrs PRN PO/IM for agitation -Discontinue Ativan 1 mg QHS PRN as it can worsen the delirium. - Daily EKG, QTC today 453 - EEG pending - Will continue to follow up     Ambrose Finland, MD 11/02/2017, 10:21 AM

## 2017-11-02 NOTE — Plan of Care (Signed)
  Problem: Safety: Goal: Ability to remain free from injury will improve Outcome: Progressing   

## 2017-11-03 LAB — URINE CULTURE

## 2017-11-03 LAB — RPR: RPR Ser Ql: NONREACTIVE

## 2017-11-03 MED ORDER — FOSFOMYCIN TROMETHAMINE 3 G PO PACK
3.0000 g | PACK | Freq: Once | ORAL | Status: AC
  Administered 2017-11-03: 3 g via ORAL
  Filled 2017-11-03: qty 3

## 2017-11-03 NOTE — Progress Notes (Signed)
PROGRESS NOTE  [name] EVO:[id number] DOB: Apr 17, 1956 DOA: 10/16/2017 PCP: Rinaldo Cloud, MD  HPI/Recap of past 54 hours: 62 year old male with medical history significant for MS, bipolar disorder, alcohol/tobacco abuse, presented to the ER complaining of abdominal pain, nausea/vomiting for the past 2 weeks.  Patient denied any NSAID/aspirin use, no history of GI bleeding in the past.  Patient underwent EGD which revealed a large, deep duodenal bulb ulcer.  Patient underwent empiric embolization by IR on 7/1.  Hospitalization further complicated by acute encephalopathy.  Today, patient noted to have an assisted fall, didn't hit head, landed on sacrum. Got out of bed, still confused. Pt continues to be physically and verbally abusive. Restraints applied once again.  Assessment/Plan: Principal Problem:   Symptomatic anemia Active Problems:   MS (multiple sclerosis) (H[facility])   Bipolar affective disorder, depressed, severe ([facility])   Bipolar I disorder ([facility])   Upper GI bleeding   GI bleed   Acute encephalopathy   Goals of care, counseling/discussion   Palliative care encounter  Upper GI bleed due to duodenal bulb ulcer Stable Likely due to chronic alcohol abuse Positive FOBT, with hemoglobin drop GI consulted, status post EGD on 6/27 showed very large deep duodenal bulb ulcer, LA grade D esophagitis IR consulted, status post empiric embolization on 10/21/2017 Continue PPI 40 mg twice daily for 8 weeks, then 40 mg daily indefinitely Need repeat EGD in 8 weeks to document healing of esophagitis as well as large duodenal bulb ulcer.  Will follow up with GI in 6 weeks  Acute blood loss anemia Stable Likely due to above Hemoglobin at baseline 14-15, 10.1 on admission Status post 2 units of packed RBC on 7/4 Anemia panel showed iron of 77, ferritin of 122, vitamin B12 180, folate 11.4 Vitamin B12 on the low normal side, status post 1 dose of B12 given, continue PO daily  Acute metabolic  encephalopathy Unclear etiology, waxing and waning, likely ?acute delirium No current signs of infection, CXR negative UA with positive nitrite only, UC showed multiple species X 2, will give a dose of fosfomycin CT head negative for any acute intracranial abnormalities MRI negative for any acute changes, unchanged from 2017 EEG consistent with mild generalized nonspecific dysfunction, encephalopathy.  No seizure or seizure predisposition recorded on the study Consulted psych, appreciate rec for med changes, start zyprexa discontinue haldol and ativan RPR, non-reactive Monitor closely, fall precautions, restraints  Multiple sclerosis Followed by neurology as an outpatient On home tecfidera, pharmacy does not stock medication, patient has to bring home meds  Bipolar disorder Unsure if patient is on any meds at home Psychiatry consulted, likely delirium, unlikely manic episode from bipolar, rec med adjustment as mentioned above  Alcohol abuse Last alcoholic drink was over 1 week ago Counseled against     Code Status: Full  Family Communication: None at bedside  Disposition Plan: SNF, once off  restraints   Consultants:  GI  IR  Psychiatry  Procedures:  EGD  Embolization by IR  Antimicrobials:  Fosfomycin X 1 dose  DVT prophylaxis: SCDs   Objective: Vitals:   11/02/17 0712 11/02/17 1409 11/02/17 2046 11/03/17 0550  BP: 109/76 (!) 142/95 126/81 114/76  Pulse: 76  80 65  Resp: 20     Temp: 98.3 F (36.8 C)  97.9 F (36.6 C) 98.7 F (37.1 C)  TempSrc:   Axillary Axillary  SpO2:   97% 96%  Weight:    69.2 kg (152 lb 8.9 oz)  Height:  Intake/Output Summary (Last 24 hours) at 11/03/2017 1700 Last data filed at 11/03/2017 0551 Gross per 24 hour  Intake -  Output 200 ml  Net -200 ml   Filed Weights   10/31/17 0459 11/02/17 0500 11/03/17 0550  Weight: 68.8 kg (151 lb 10.8 oz) 69.2 kg (152 lb 8.9 oz) 69.2 kg (152 lb 8.9 oz)     Exam:   General: NAD, still on restraints  Cardiovascular: S1, S2 present  Respiratory: CTA B  Abdomen: Soft, nontender, nondistended, bowel sounds present  Musculoskeletal: No pedal edema bilaterally  Skin: Normal  Psychiatry: Unable to assess, intermittently agitated/abusive   Data Reviewed: CBC: Recent Labs  Lab 10/28/17 0532 10/30/17 0607 10/31/17 0629 11/01/17 0552 11/02/17 0514  WBC 4.6 3.5* 3.8* 3.9* 3.9*  NEUTROABS  --   --  2.5 2.7 2.6  HGB 10.3* 10.4* 11.5* 11.9* 11.3*  HCT 31.5* 32.0* 36.3* 37.6* 35.7*  MCV 96.6 96.1 97.8 97.9 97.3  PLT 296 359 352 395 378   Basic Metabolic Panel: Recent Labs  Lab 10/28/17 0532 10/30/17 0607 10/31/17 0629 11/01/17 0552 11/02/17 0514  NA 145 141 142 142 143  K 4.1 3.9 4.3 4.1 4.1  CL 115* 109 111 109 109  CO2 22 25 23 24 26   GLUCOSE 100* 113* 86 86 92  BUN 14 15 9 10 15   CREATININE 0.96 1.01 0.91 1.00 1.15  CALCIUM 8.4* 8.7* 8.9 9.2 9.1  MG 2.1  --   --   --   --   PHOS 3.5  --   --   --   --    GFR: Estimated Creatinine Clearance: 65.2 mL/min (by C-G formula based on SCr of 1.15 mg/dL). Liver Function Tests: No results for input(s): AST, ALT, ALKPHOS, BILITOT, PROT, ALBUMIN in the last 168 hours. No results for input(s): LIPASE, AMYLASE in the last 168 hours. No results for input(s): AMMONIA in the last 168 hours. Coagulation Profile: No results for input(s): INR, PROTIME in the last 168 hours. Cardiac Enzymes: No results for input(s): CKTOTAL, CKMB, CKMBINDEX, TROPONINI in the last 168 hours. BNP (last 3 results) No results for input(s): PROBNP in the last 8760 hours. HbA1C: No results for input(s): HGBA1C in the last 72 hours. CBG: Recent Labs  Lab 10/27/17 2034  GLUCAP 121*   Lipid Profile: No results for input(s): CHOL, HDL, LDLCALC, TRIG, CHOLHDL, LDLDIRECT in the last 72 hours. Thyroid Function Tests: No results for input(s): TSH, T4TOTAL, FREET4, T3FREE, THYROIDAB in the last 72  hours. Anemia Panel: No results for input(s): VITAMINB12, FOLATE, FERRITIN, TIBC, IRON, RETICCTPCT in the last 72 hours. Urine analysis:    Component Value Date/Time   COLORURINE YELLOW 11/01/2017 0208   APPEARANCEUR CLEAR 11/01/2017 0208   LABSPEC 1.015 11/01/2017 0208   PHURINE 6.0 11/01/2017 0208   GLUCOSEU NEGATIVE 11/01/2017 0208   HGBUR NEGATIVE 11/01/2017 0208   BILIRUBINUR NEGATIVE 11/01/2017 0208   KETONESUR NEGATIVE 11/01/2017 0208   PROTEINUR NEGATIVE 11/01/2017 0208   UROBILINOGEN 0.2 07/08/2013 0930   NITRITE POSITIVE (A) 11/01/2017 0208   LEUKOCYTESUR NEGATIVE 11/01/2017 0208   Sepsis Labs: @LABRCNTIP (procalcitonin:4,lacticidven:4)  ) Recent Results (from the past 240 hour(s))  Urine Culture     Status: Abnormal   Collection Time: 11/01/17  8:50 AM  Result Value Ref Range Status   Specimen Description   Final    URINE, RANDOM Performed at [facility], [address], [place], [place] [address]    Special Requests   Final  NONE Performed at [facility], [address], [place], [place] [address]    Culture MULTIPLE SPECIES PRESENT, SUGGEST RECOLLECTION (A)  Final   Report Status 11/02/2017 FINAL  Final  Urine Culture     Status: Abnormal   Collection Time: 11/02/17  3:45 PM  Result Value Ref Range Status   Specimen Description   Final    URINE, RANDOM Performed at [facility], [address] [place], [place] [address]    Special Requests   Final    NONE Performed at [facility], [address] [place], [place] [address]    Culture MULTIPLE SPECIES PRESENT, SUGGEST RECOLLECTION (A)  Final   Report Status 11/03/2017 FINAL  Final      Studies: No results found.  Scheduled Meds: . Dimethyl Fumarate  240 mg Oral BID  . feeding supplement (ENSURE ENLIVE)  237 mL Oral TID BM  . folic acid  1 mg Oral Daily  . mouth rinse  15 mL Mouth Rinse BID  . multivitamin with  minerals  1 tablet Oral Daily  . OLANZapine  5 mg Oral BID  . pantoprazole sodium  40 mg Per Tube BID AC  . polyethylene glycol  17 g Oral Daily  . senna-docusate  1 tablet Oral QHS  . sodium chloride flush  3 mL Intravenous Q12H  . thiamine  100 mg Oral Daily  . vitamin B-12  1,000 mcg Oral Daily    Continuous Infusions:   LOS: 16 days     Briant Cedar, MD [facility]   If 7PM-7AM, please contact night-coverage [email or web address] Password [email or web address] 11/03/2017, 5:00 PM

## 2017-11-03 NOTE — Progress Notes (Signed)
Patient climbed out of bed. Patient confused and agitated. Restraints had been removed this am.  Patient combative and hit staff.  Security notified.  Patient not following commands. Patient unsteady on feet and refusing to sit down.  Patient had an assisted, witness fall to floor. Patient landed on sacrum.  No injury noted. Patient denies injury or pain. Patient stated he was getting dressed to go watch a movie.  Security arrived and patient placed back in bed.  MD notified and restraints applied per MD order.

## 2017-11-03 NOTE — Progress Notes (Signed)
Patient verbally and physically abusive.  Patient shouting and threatening staff.  Security called and PRN medication administered per MD order.

## 2017-11-04 NOTE — Progress Notes (Signed)
Telesitter called and stated that they weren't allowed to watch patient's in restraints.  They asked that order be dc'd and restarted once patient off of restraints.

## 2017-11-04 NOTE — Progress Notes (Signed)
PROGRESS NOTE  [name] ZOX:[id number] DOB: Sep 09, 1955 DOA: 10/16/2017 PCP: Rinaldo Cloud, MD  HPI/Recap of past 73 hours: 62 year old male with medical history significant for MS, bipolar disorder, alcohol/tobacco abuse, presented to the ER complaining of abdominal pain, nausea/vomiting for the past 2 weeks.  Patient denied any NSAID/aspirin use, no history of GI bleeding in the past.  Patient underwent EGD which revealed a large, deep duodenal bulb ulcer.  Patient underwent empiric embolization by IR on 7/1.  Hospitalization further complicated by acute encephalopathy.  Today, patient denies any new complaints.  Assessment/Plan: Principal Problem:   Symptomatic anemia Active Problems:   MS (multiple sclerosis) (HCC)   Bipolar affective disorder, depressed, severe (HCC)   Bipolar I disorder (HCC)   Upper GI bleeding   GI bleed   Acute encephalopathy   Goals of care, counseling/discussion   Palliative care encounter  Upper GI bleed due to duodenal bulb ulcer Stable Likely due to chronic alcohol abuse Positive FOBT, with hemoglobin drop GI consulted, status post EGD on 6/27 showed very large deep duodenal bulb ulcer, LA grade D esophagitis IR consulted, status post empiric embolization on 10/21/2017 Continue PPI 40 mg twice daily for 8 weeks, then 40 mg daily indefinitely Need repeat EGD in 8 weeks to document healing of esophagitis as well as large duodenal bulb ulcer.  Will follow up with GI in 6 weeks  Acute blood loss anemia Stable Likely due to above Hemoglobin at baseline 14-15, 10.1 on admission Status post 2 units of packed RBC on 7/4 Anemia panel showed iron of 77, ferritin of 122, vitamin B12 180, folate 11.4 Vitamin B12 on the low normal side, status post 1 dose of B12 given, continue PO daily  Acute metabolic encephalopathy Unclear etiology, waxing and waning, likely ?acute delirium No current signs of infection, CXR negative UA with positive nitrite only, UC  showed multiple species X 2, will give a dose of fosfomycin CT head negative for any acute intracranial abnormalities MRI negative for any acute changes, unchanged from 2017 EEG consistent with mild generalized nonspecific dysfunction, encephalopathy.  No seizure or seizure predisposition recorded on the study Consulted psych, appreciate rec for med changes, start zyprexa discontinue haldol and ativan RPR, non-reactive Monitor closely, fall precautions, restraints  Multiple sclerosis Followed by neurology as an outpatient On home tecfidera, pharmacy does not stock medication, patient has to bring home meds  Bipolar disorder Unsure if patient is on any meds at home Psychiatry consulted, likely delirium, unlikely manic episode from bipolar, rec med adjustment as mentioned above  Alcohol abuse Last alcoholic drink was over 1 week ago Counseled against     Code Status: Full  Family Communication: None at bedside  Disposition Plan: SNF, once off  restraints   Consultants:  GI  IR  Psychiatry  Procedures:  EGD  Embolization by IR  Antimicrobials:  Fosfomycin X 1 dose  DVT prophylaxis: SCDs   Objective: Vitals:   11/03/17 0550 11/03/17 2109 11/04/17 0537 11/04/17 1340  BP: 114/76 97/74 110/82 101/65  Pulse: 65 60 88 84  Resp:  15 16 20   Temp: 98.7 F (37.1 C) 98.8 F (37.1 C) 98.1 F (36.7 C) 98.4 F (36.9 C)  TempSrc: Axillary Oral Oral Oral  SpO2: 96% 98% 95% 94%  Weight: 69.2 kg (152 lb 8.9 oz)  68.1 kg (150 lb 2.1 oz)   Height:        Intake/Output Summary (Last 24 hours) at 11/04/2017 1927 Last data filed at 11/04/2017  1700 Gross per 24 hour  Intake 0 ml  Output -  Net 0 ml   Filed Weights   11/02/17 0500 11/03/17 0550 11/04/17 0537  Weight: 69.2 kg (152 lb 8.9 oz) 69.2 kg (152 lb 8.9 oz) 68.1 kg (150 lb 2.1 oz)    Exam:   General: NAD, still on restraints  Cardiovascular: S1, S2 present  Respiratory: CTA B  Abdomen: Soft,  nontender, nondistended, bowel sounds present  Musculoskeletal: No pedal edema bilaterally  Skin: Normal  Psychiatry: Unable to assess, intermittently agitated/abusive   Data Reviewed: CBC: Recent Labs  Lab 10/30/17 0607 10/31/17 0629 11/01/17 0552 11/02/17 0514  WBC 3.5* 3.8* 3.9* 3.9*  NEUTROABS  --  2.5 2.7 2.6  HGB 10.4* 11.5* 11.9* 11.3*  HCT 32.0* 36.3* 37.6* 35.7*  MCV 96.1 97.8 97.9 97.3  PLT 359 352 395 378   Basic Metabolic Panel: Recent Labs  Lab 10/30/17 0607 10/31/17 0629 11/01/17 0552 11/02/17 0514  NA 141 142 142 143  K 3.9 4.3 4.1 4.1  CL 109 111 109 109  CO2 25 23 24 26   GLUCOSE 113* 86 86 92  BUN 15 9 10 15   CREATININE 1.01 0.91 1.00 1.15  CALCIUM 8.7* 8.9 9.2 9.1   GFR: Estimated Creatinine Clearance: 64.2 mL/min (by C-G formula based on SCr of 1.15 mg/dL). Liver Function Tests: No results for input(s): AST, ALT, ALKPHOS, BILITOT, PROT, ALBUMIN in the last 168 hours. No results for input(s): LIPASE, AMYLASE in the last 168 hours. No results for input(s): AMMONIA in the last 168 hours. Coagulation Profile: No results for input(s): INR, PROTIME in the last 168 hours. Cardiac Enzymes: No results for input(s): CKTOTAL, CKMB, CKMBINDEX, TROPONINI in the last 168 hours. BNP (last 3 results) No results for input(s): PROBNP in the last 8760 hours. HbA1C: No results for input(s): HGBA1C in the last 72 hours. CBG: No results for input(s): GLUCAP in the last 168 hours. Lipid Profile: No results for input(s): CHOL, HDL, LDLCALC, TRIG, CHOLHDL, LDLDIRECT in the last 72 hours. Thyroid Function Tests: No results for input(s): TSH, T4TOTAL, FREET4, T3FREE, THYROIDAB in the last 72 hours. Anemia Panel: No results for input(s): VITAMINB12, FOLATE, FERRITIN, TIBC, IRON, RETICCTPCT in the last 72 hours. Urine analysis:    Component Value Date/Time   COLORURINE YELLOW 11/01/2017 0208   APPEARANCEUR CLEAR 11/01/2017 0208   LABSPEC 1.015 11/01/2017 0208     PHURINE 6.0 11/01/2017 0208   GLUCOSEU NEGATIVE 11/01/2017 0208   HGBUR NEGATIVE 11/01/2017 0208   BILIRUBINUR NEGATIVE 11/01/2017 0208   KETONESUR NEGATIVE 11/01/2017 0208   PROTEINUR NEGATIVE 11/01/2017 0208   UROBILINOGEN 0.2 07/08/2013 0930   NITRITE POSITIVE (A) 11/01/2017 0208   LEUKOCYTESUR NEGATIVE 11/01/2017 0208   Sepsis Labs: @LABRCNTIP (procalcitonin:4,lacticidven:4)  ) Recent Results (from the past 240 hour(s))  Urine Culture     Status: Abnormal   Collection Time: 11/01/17  8:50 AM  Result Value Ref Range Status   Specimen Description   Final    URINE, RANDOM Performed at [facility], [address] [place], [place] [address]    Special Requests   Final    NONE Performed at [facility], [address], [place], [place] [address]    Culture MULTIPLE SPECIES PRESENT, SUGGEST RECOLLECTION (A)  Final   Report Status 11/02/2017 FINAL  Final  Urine Culture     Status: Abnormal   Collection Time: 11/02/17  3:45 PM  Result Value Ref Range Status   Specimen  Description   Final    URINE, RANDOM Performed at [facility], [address], [place], [place] [address]    Special Requests   Final    NONE Performed at [facility], [address], [place], [place] [address]    Culture MULTIPLE SPECIES PRESENT, SUGGEST RECOLLECTION (A)  Final   Report Status 11/03/2017 FINAL  Final      Studies: No results found.  Scheduled Meds: . Dimethyl Fumarate  240 mg Oral BID  . feeding supplement (ENSURE ENLIVE)  237 mL Oral TID BM  . folic acid  1 mg Oral Daily  . mouth rinse  15 mL Mouth Rinse BID  . multivitamin with minerals  1 tablet Oral Daily  . OLANZapine  5 mg Oral BID  . pantoprazole sodium  40 mg Per Tube BID AC  . polyethylene glycol  17 g Oral Daily  . senna-docusate  1 tablet Oral QHS  . sodium chloride flush  3 mL Intravenous Q12H  . thiamine  100 mg Oral Daily  . vitamin B-12   1,000 mcg Oral Daily    Continuous Infusions:   LOS: 17 days     Briant Cedar, MD [facility]   If 7PM-7AM, please contact night-coverage [email or web address] Password [facility] 11/04/2017, 7:27 PM

## 2017-11-04 NOTE — Progress Notes (Signed)
Nutrition Follow-up  DOCUMENTATION CODES:   Not applicable  INTERVENTION:    Ensure Enlive po BID, each supplement provides 350 kcal and 20 grams of protein  Magic cup TID with meals, each supplement provides 290 kcal and 9 grams of protein  Provide MVI daily  NUTRITION DIAGNOSIS:   Inadequate oral intake related to lethargy/confusion, acute illness(alcohol withdrawal) as evidenced by meal completion < 25%.  Ongoing  GOAL:   Patient will meet greater than or equal to 90% of their needs  Not meeting  MONITOR:   Diet advancement, Weight trends, Labs, Other (Comment)(need for NGT replacement and TF re-start)  REASON FOR ASSESSMENT:   Consult Enteral/tube feeding initiation and management  ASSESSMENT:   62 y.o. male with medical history significant of MS, bipolar disorder, alcohol and tobacco abuse, presented to the emergency department complaining of abdominal pain with nausea and vomiting.  Patient is poor historian.  He reports to 2-3 days of abdominal pain associated with bloody nausea and vomiting.  Patient reported epigastric pain started approximately 2 weeks ago which has been worse with eating.    Pt continues to be physically and verbally abusive. Restraints have been applied. Spoke with RN who reports pt is refusing to eat or take medications. They have attempted to feed/adminster PO medications and pt spits it at the staff. Pt has not drank his supplements. Meal completions charted as 0-40% for his last eight meals. If poor intake continues may need to consider alternate route of nutrition. RD to add magic cup in attempts to maximize calories and protein.   Weight noted to decreased 5 lb since last RD visit on 7/8 (155 lb to 150 lb). Will continue to monitor.   Medications reviewed and include: folic acid, MVI with minerals, thiamine, Vit B12 Labs reviewed.   Diet Order:   Diet Order           DIET DYS 3 Room service appropriate? Yes; Fluid consistency: Thin   Diet effective now          EDUCATION NEEDS:   No education needs have been identified at this time  Skin:  Skin Assessment: Skin Integrity Issues: Skin Integrity Issues:: Incisions Incisions: R groin (7/1)  Last BM:  10/28/17  Height:   Ht Readings from Last 1 Encounters:  10/17/17 5\' 11"  (1.803 m)    Weight:   Wt Readings from Last 1 Encounters:  11/04/17 150 lb 2.1 oz (68.1 kg)    Ideal Body Weight:  78.18 kg  BMI:  Body mass index is 20.94 kg/m.  Estimated Nutritional Needs:   Kcal:  2000-2240 (25-28 kcal/kg)  Protein:  96-112 grams (1.2-1.4 grams/kg)  Fluid:  >/= 2 L/day    Vanessa Kick RD, LDN Clinical Nutrition Pager # - [phone]

## 2017-11-05 DIAGNOSIS — R4182 Altered mental status, unspecified: Secondary | ICD-10-CM

## 2017-11-05 MED ORDER — OLANZAPINE 10 MG PO TABS
10.0000 mg | ORAL_TABLET | Freq: Every day | ORAL | Status: DC
  Administered 2017-11-05 – 2017-11-10 (×6): 10 mg via ORAL
  Filled 2017-11-05 (×6): qty 1

## 2017-11-05 MED ORDER — OLANZAPINE 5 MG PO TABS
5.0000 mg | ORAL_TABLET | Freq: Every day | ORAL | Status: DC
  Filled 2017-11-05 (×2): qty 1

## 2017-11-05 MED ORDER — OLANZAPINE 10 MG PO TABS: 10.0000 mg | ORAL_TABLET | Freq: Every day | ORAL | Status: DC

## 2017-11-05 NOTE — Progress Notes (Addendum)
Physical Therapy Treatment Patient Details Name: [name] MRN: [id number] DOB: 03-Dec-1955 Today's Date: 11/05/2017    History of Present Illness [name] is a 62 y.o. male with medical history significant of MS (follows with Dr. Epimenio Foot), bipolar disorder, alcohol and tobacco abuse, presented to the emergency department complaining of abdominal pain with nausea and vomiting.  Patient is poor historian.  He reports to 2-3 days of abdominal pain associated with nausea and vomiting.  Patient reported epigastric pain started approximately 2 weeks ago which has been worse with eating.  Denies diarrhea, constipation and fever or chills.  Last alcohol use 3 days ago.  Normally drinks occasionally but can drink up to 12 pack a day.  Other associated symptoms include weakness/faint.  Denies NSAID/ASA use, no history of GI bleeding in the past.  Patient denies dark stools.  Patient underwent EGD which revealed large, deep duodenal bulb ulcer.  Patient underwent empiric embolization by IR on 7/1.  Hospitalization further complicated by altered mental status.    PT Comments    Assisted patient with bed mobility and sit to stand. Upon arrival patient was in bed with 4 point restraints applied and wearing sunglasses. Patient was awake and alert; speech was slurred and unintelligible at times. AxO x 2 and appropriate.  Removed restraints and used mod +2 physical assist to elevate trunk and move LE's. Patient c/o pain and discomfort in L leg and back with movement. Patient required max +2 underarm HHA to go from sit to stand; patient was unable to ambulate other than  a few side steps along EOB.  Assisted patient back in bed, repositioned patient for comfort, and reapplied restraints. During the therapy session patient drifted in and out of awareness with moments of lucidity.   Follow Up Recommendations  SNF     Equipment Recommendations  None recommended by PT    Recommendations for Other Services        Precautions / Restrictions Precautions Precautions: Fall Restrictions Weight Bearing Restrictions: No    Mobility  Bed Mobility Overal bed mobility: Needs Assistance Bed Mobility: Sit to Supine;Supine to Sit     Supine to sit: +2 for physical assistance;+2 for safety/equipment;Mod assist Sit to supine: Mod assist;+2 for physical assistance;+2 for safety/equipment   General bed mobility comments: assist with LEs and trunk  in both directions  Transfers Overall transfer level: Needs assistance Equipment used: None Transfers: Sit to/from Stand Sit to Stand: Max assist;+2 physical assistance;+2 safety/equipment         General transfer comment: unable to stand without max underarm HHA; flexed trunk and knees  Ambulation/Gait             General Gait Details: few lateral steps along EOB  Unable to to functionally weight shift and unable to functionally take steps. Poor posture of B flex hips and knees as well as forward flex trunk.  Stairs             Wheelchair Mobility    Modified Rankin (Stroke Patients Only)       Balance                                            Cognition Arousal/Alertness: Awake/alert Behavior During Therapy: Flat affect Overall Cognitive Status: Impaired/Different from baseline Area of Impairment: Orientation;Attention;Safety/judgement  Orientation Level: Disoriented to;Place;Time;Situation Current Attention Level: Divided     Safety/Judgement: Decreased awareness of safety;Decreased awareness of deficits   Problem Solving: Slow processing;Decreased initiation;Difficulty sequencing;Requires verbal cues;Requires tactile cues General Comments: patient lethargic and metally foggy; difficult to understand; slurred speech      Exercises      General Comments        Pertinent Vitals/Pain Pain Assessment: Faces Faces Pain Scale: Hurts little more Pain Location: L leg and  back with activity Pain Descriptors / Indicators: Discomfort Pain Intervention(s): Limited activity within patient's tolerance;Repositioned;Monitored during session    Home Living                      Prior Function            PT Goals (current goals can now be found in the care plan section) Progress towards PT goals: Progressing toward goals    Frequency    Min 2X/week      PT Plan Frequency needs to be updated    Co-evaluation              AM-PAC PT "6 Clicks" Daily Activity  Outcome Measure  Difficulty turning over in bed (including adjusting bedclothes, sheets and blankets)?: Unable Difficulty moving from lying on back to sitting on the side of the bed? : Unable Difficulty sitting down on and standing up from a chair with arms (e.g., wheelchair, bedside commode, etc,.)?: Unable Help needed moving to and from a bed to chair (including a wheelchair)?: A Lot Help needed walking in hospital room?: A Lot Help needed climbing 3-5 steps with a railing? : Total 6 Click Score: 8    End of Session Equipment Utilized During Treatment: Gait belt Activity Tolerance: Patient tolerated treatment well Patient left: in bed;with call bell/phone within reach;with bed alarm set;with restraints reapplied   PT Visit Diagnosis: Difficulty in walking, not elsewhere classified (R26.2);Unsteadiness on feet (R26.81)     Time:  -     Charges:                       G Codes:       Randa Lynn, SPTA 11/05/2017, 12:35 PM  Direct supervision during session and agree with above  Felecia Shelling  PTA [facility]  Acute  Rehab Pager      [phone]

## 2017-11-05 NOTE — Care Management Important Message (Signed)
Important Message  Patient Details  Name: [name] MRN: [id number] Date of Birth: [birth year]   Medicare Important Message Given:  Yes    Caren Macadam 11/05/2017, 11:18 AMImportant Message  Patient Details  Name: [name] MRN: [id number] Date of Birth: [birth year]   Medicare Important Message Given:  Yes    Caren Macadam 11/05/2017, 11:18 AM

## 2017-11-05 NOTE — Progress Notes (Signed)
PROGRESS NOTE  Alexander FULLILOVE [name]:[id number] DOB: [birth year] DOA: 10/16/2017 PCP: Rinaldo Cloud, MD  HPI/Recap of past 31 hours: 62 year old male with medical history significant for MS, bipolar disorder, alcohol/tobacco abuse, presented to the ER complaining of abdominal pain, nausea/vomiting for the past 2 weeks.  Patient denied any NSAID/aspirin use, no history of GI bleeding in the past.  Patient underwent EGD which revealed a large, deep duodenal bulb ulcer.  Patient underwent empiric embolization by IR on 7/1.  Hospitalization further complicated by acute encephalopathy.  Today, patient noted to be yelling at nobody in the room. Also noted to be intermittently agitated and yelling at staff members.    Assessment/Plan: Principal Problem:   Symptomatic anemia Active Problems:   MS (multiple sclerosis) (HCC)   Bipolar affective disorder, depressed, severe (HCC)   Bipolar I disorder (HCC)   Upper GI bleeding   GI bleed   Acute encephalopathy   Goals of care, counseling/discussion   Palliative care encounter  Upper GI bleed due to duodenal bulb ulcer Stable Likely due to chronic alcohol abuse Positive FOBT, with hemoglobin drop GI consulted, status post EGD on 6/27 showed very large deep duodenal bulb ulcer, LA grade D esophagitis IR consulted, status post empiric embolization on 10/21/2017 Continue PPI 40 mg twice daily for 8 weeks, then 40 mg daily indefinitely Need repeat EGD in 8 weeks to document healing of esophagitis as well as large duodenal bulb ulcer.  Will follow up with GI in 6 weeks  Acute blood loss anemia Stable Likely due to above Hemoglobin at baseline 14-15, 10.1 on admission Status post 2 units of packed RBC on 7/4 Anemia panel showed iron of 77, ferritin of 122, vitamin B12 180, folate 11.4 Vitamin B12 on the low normal side, status post 1 dose of B12 given, continue PO daily  Acute metabolic encephalopathy Unclear etiology, waxing and waning, likely  ?delirium No current signs of infection, CXR negative UA with positive nitrite only, UC showed multiple species X 2, will give a dose of fosfomycin CT head negative for any acute intracranial abnormalities MRI negative for any acute changes, unchanged from 2017 EEG consistent with mild generalized nonspecific dysfunction, encephalopathy.  No seizure or seizure predisposition recorded on the study Consulted psych, appreciate rec for med changes, adjust zyprexa, discontinue haldol and ativan. Not eligible to transfer to [facility], non-reactive  Monitor closely, fall precautions, restraints  Multiple sclerosis Followed by neurology as an outpatient On home tecfidera, pharmacy does not stock medication, patient has to bring home meds  Bipolar disorder Unsure if patient is on any meds at home Psychiatry consulted, likely delirium, unlikely manic episode from bipolar, rec med adjustment as mentioned above  Alcohol abuse Last alcoholic drink was over 1 week ago PTA Counseled against     Code Status: Full  Family Communication: None at bedside  Disposition Plan: SNF, once off  restraints   Consultants:  GI  IR  Psychiatry  Procedures:  EGD  Embolization by IR  Antimicrobials:  Fosfomycin X 1 dose  DVT prophylaxis: SCDs   Objective: Vitals:   11/04/17 2225 11/05/17 0612 11/05/17 1100 11/05/17 1510  BP: (!) 137/93 110/79  131/86  Pulse: 97 88  (!) 102  Resp: 17 20  17   Temp: 98.5 F (36.9 C) 98.2 F (36.8 C)  98.2 F (36.8 C)  TempSrc:  Oral    SpO2:  98%  99%  Weight:   68.4 kg (150 lb 12.7 oz)   Height:  Intake/Output Summary (Last 24 hours) at 11/05/2017 1631 Last data filed at 11/05/2017 1259 Gross per 24 hour  Intake 80 ml  Output 225 ml  Net -145 ml   Filed Weights   11/03/17 0550 11/04/17 0537 11/05/17 1100  Weight: 69.2 kg (152 lb 8.9 oz) 68.1 kg (150 lb 2.1 oz) 68.4 kg (150 lb 12.7 oz)    Exam:   General: NAD, still on  restraints  Cardiovascular: S1, S2 present  Respiratory: CTA B  Abdomen: Soft, nontender, nondistended, bowel sounds present  Musculoskeletal: No pedal edema bilaterally  Skin: Normal  Psychiatry: Unable to assess, intermittently agitated/abusive   Data Reviewed: CBC: Recent Labs  Lab 10/30/17 0607 10/31/17 0629 11/01/17 0552 11/02/17 0514  WBC 3.5* 3.8* 3.9* 3.9*  NEUTROABS  --  2.5 2.7 2.6  HGB 10.4* 11.5* 11.9* 11.3*  HCT 32.0* 36.3* 37.6* 35.7*  MCV 96.1 97.8 97.9 97.3  PLT 359 352 395 378   Basic Metabolic Panel: Recent Labs  Lab 10/30/17 0607 10/31/17 0629 11/01/17 0552 11/02/17 0514  NA 141 142 142 143  K 3.9 4.3 4.1 4.1  CL 109 111 109 109  CO2 25 23 24 26   GLUCOSE 113* 86 86 92  BUN 15 9 10 15   CREATININE 1.01 0.91 1.00 1.15  CALCIUM 8.7* 8.9 9.2 9.1   GFR: Estimated Creatinine Clearance: 64.4 mL/min (by C-G formula based on SCr of 1.15 mg/dL). Liver Function Tests: No results for input(s): AST, ALT, ALKPHOS, BILITOT, PROT, ALBUMIN in the last 168 hours. No results for input(s): LIPASE, AMYLASE in the last 168 hours. No results for input(s): AMMONIA in the last 168 hours. Coagulation Profile: No results for input(s): INR, PROTIME in the last 168 hours. Cardiac Enzymes: No results for input(s): CKTOTAL, CKMB, CKMBINDEX, TROPONINI in the last 168 hours. BNP (last 3 results) No results for input(s): PROBNP in the last 8760 hours. HbA1C: No results for input(s): HGBA1C in the last 72 hours. CBG: No results for input(s): GLUCAP in the last 168 hours. Lipid Profile: No results for input(s): CHOL, HDL, LDLCALC, TRIG, CHOLHDL, LDLDIRECT in the last 72 hours. Thyroid Function Tests: No results for input(s): TSH, T4TOTAL, FREET4, T3FREE, THYROIDAB in the last 72 hours. Anemia Panel: No results for input(s): VITAMINB12, FOLATE, FERRITIN, TIBC, IRON, RETICCTPCT in the last 72 hours. Urine analysis:    Component Value Date/Time   COLORURINE YELLOW  11/01/2017 0208   APPEARANCEUR CLEAR 11/01/2017 0208   LABSPEC 1.015 11/01/2017 0208   PHURINE 6.0 11/01/2017 0208   GLUCOSEU NEGATIVE 11/01/2017 0208   HGBUR NEGATIVE 11/01/2017 0208   BILIRUBINUR NEGATIVE 11/01/2017 0208   KETONESUR NEGATIVE 11/01/2017 0208   PROTEINUR NEGATIVE 11/01/2017 0208   UROBILINOGEN 0.2 07/08/2013 0930   NITRITE POSITIVE (A) 11/01/2017 0208   LEUKOCYTESUR NEGATIVE 11/01/2017 0208   Sepsis Labs: @LABRCNTIP (procalcitonin:4,lacticidven:4)  ) Recent Results (from the past 240 hour(s))  Urine Culture     Status: Abnormal   Collection Time: 11/01/17  8:50 AM  Result Value Ref Range Status   Specimen Description   Final    URINE, RANDOM Performed at [facility], [address], [place], [place] [address]    Special Requests   Final    NONE Performed at [facility], [address] [place], [place] [address]    Culture MULTIPLE SPECIES PRESENT, SUGGEST RECOLLECTION (A)  Final   Report Status 11/02/2017 FINAL  Final  Urine Culture     Status: Abnormal   Collection Time:  11/02/17  3:45 PM  Result Value Ref Range Status   Specimen Description   Final    URINE, RANDOM Performed at [facility], [address] [place], [place] [address]    Special Requests   Final    NONE Performed at [facility], [address] [place], [place] [address]    Culture MULTIPLE SPECIES PRESENT, SUGGEST RECOLLECTION (A)  Final   Report Status 11/03/2017 FINAL  Final      Studies: No results found.  Scheduled Meds: . Dimethyl Fumarate  240 mg Oral BID  . feeding supplement (ENSURE ENLIVE)  237 mL Oral TID BM  . folic acid  1 mg Oral Daily  . mouth rinse  15 mL Mouth Rinse BID  . multivitamin with minerals  1 tablet Oral Daily  . OLANZapine  10 mg Oral QHS  . [START ON 11/06/2017] OLANZapine  5 mg Oral Daily  . pantoprazole sodium  40 mg Per Tube BID AC  . polyethylene glycol  17 g Oral  Daily  . senna-docusate  1 tablet Oral QHS  . sodium chloride flush  3 mL Intravenous Q12H  . thiamine  100 mg Oral Daily  . vitamin B-12  1,000 mcg Oral Daily    Continuous Infusions:   LOS: 18 days     Briant Cedar, MD [facility]   If 7PM-7AM, please contact night-coverage [email or web address] Password [facility] 11/05/2017, 4:31 PM

## 2017-11-05 NOTE — Consult Note (Addendum)
[facility] Psych Consult Progress Note  11/05/2017 11:33 AM [name]  MRN:  [id number] Subjective:   [name] was last seen by the psychiatry consult service on 7/13 for delirium with agitation. Zyprexa was increased to 5 mg BID and Haldol and Ativan were discontinued.   On interview, [name] is disorganized in thought process. It is difficult to make eye contact with him since he is wearing sunglasses. He has had fluctuations in his mental status. He is only oriented to person. He believes that the year is 2021. He provides illogical answers to most questions due to AMS. He endorses AVH then reports "I don't know" when asked to elaborate. He reports sleeping well overnight.   Principal Problem: Altered mental status Diagnosis:   Patient Active Problem List   Diagnosis Date Noted  . Acute encephalopathy [G93.40]   . Goals of care, counseling/discussion [Z71.89]   . Palliative care encounter [Z51.5]   . GI bleed [K92.2] 10/18/2017  . Symptomatic anemia [D64.9]   . Upper GI bleeding [K92.2] 10/16/2017  . Coccygeal pain, acute [M53.3] 04/08/2017  . Elevated TSH [R79.89] 04/06/2016  . Bipolar I disorder (HCC) [F31.9] 04/05/2016  . 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 Numbness [R20.0] 01/16/2016  . Alcohol abuse [F10.10] 07/16/2015  . Alcohol-induced mood disorder (HCC) [F10.94] 07/16/2015  . Other fatigue [R53.83] 06/29/2014  . Gait disturbance [R26.9] 06/29/2014  . Urinary urgency [R39.15] 06/29/2014  . High risk medication use [Z79.899] 06/29/2014  . Bipolar affective disorder, depressed, severe (HCC) [F31.4] 09/18/2012    Class: Chronic  . MS (multiple sclerosis) (HCC) [G35] 10/31/2011    Class: Chronic  . Dysthymia [F34.1] 05/11/2011   Total Time spent with patient: 15 minutes  Past Psychiatric History: Bipolar disorder and depression.   Past Medical History:  Past Medical History:  Diagnosis Date  . Depression   . Mental disorder   . Movement  disorder   . MS (multiple sclerosis) (HCC)   . Multiple sclerosis, relapsing-remitting (HCC)   . Neuromuscular disorder (HCC)   . Vision abnormalities     Past Surgical History:  Procedure Laterality Date  . BIOPSY  10/17/2017   Procedure: BIOPSY;  Surgeon: Kathi Der, MD;  Location: [facility];  Service: Gastroenterology;;  . ESOPHAGOGASTRODUODENOSCOPY (EGD) WITH PROPOFOL N/A 10/17/2017   Procedure: ESOPHAGOGASTRODUODENOSCOPY (EGD) WITH PROPOFOL;  Surgeon: Kathi Der, MD;  Location: [facility];  Service: Gastroenterology;  Laterality: N/A;  . IR ANGIOGRAM SELECTIVE EACH ADDITIONAL VESSEL  10/21/2017  . IR ANGIOGRAM SELECTIVE EACH ADDITIONAL VESSEL  10/21/2017  . IR ANGIOGRAM VISCERAL SELECTIVE  10/21/2017  . IR EMBO ART  VEN HEMORR LYMPH EXTRAV  INC GUIDE ROADMAPPING  10/21/2017  . IR US GUIDE VASC ACCESS RIGHT  10/21/2017  . TONSILLECTOMY     Family History:  Family History  Problem Relation Age of Onset  . Heart disease Mother   . Stroke Father    Family Psychiatric  History: Unknown  Social History:  Social History   Substance and Sexual Activity  Alcohol Use Yes  . Alcohol/week: 0.0 oz   Comment: 2-3 times a week.      Social History   Substance and Sexual Activity  Drug Use Yes  . Types: Marijuana   Comment: 1-2 times a month    Social History   Socioeconomic History  . Marital status: Legally Separated    Spouse name: Not on file  . Number of children: Not on file  . Years of education:  Not on file  . Highest education level: Not on file  Occupational History  . Not on file  Social Needs  . Financial resource strain: Not on file  . Food insecurity:    Worry: Not on file    Inability: Not on file  . Transportation needs:    Medical: Not on file    Non-medical: Not on file  Tobacco Use  . Smoking status: Current Every Day Smoker    Packs/day: 3.00    Years: 36.00    Pack years: 108.00    Types: Cigarettes  . Smokeless tobacco: Never Used   Substance and Sexual Activity  . Alcohol use: Yes    Alcohol/week: 0.0 oz    Comment: 2-3 times a week.   . Drug use: Yes    Types: Marijuana    Comment: 1-2 times a month  . Sexual activity: Not Currently    Birth control/protection: None  Lifestyle  . Physical activity:    Days per week: Not on file    Minutes per session: Not on file  . Stress: Not on file  Relationships  . Social connections:    Talks on phone: Not on file    Gets together: Not on file    Attends religious service: Not on file    Active member of club or organization: Not on file    Attends meetings of clubs or organizations: Not on file    Relationship status: Not on file  Other Topics Concern  . Not on file  Social History Narrative   ** Merged History Encounter **        Sleep: Good  Appetite:  Good  Current Medications: Current Facility-Administered Medications  Medication Dose Route Frequency Provider Last Rate Last Dose  . acetaminophen (TYLENOL) tablet 650 mg  650 mg Oral Q6H PRN Randel Pigg, Dorma Russell, MD   650 mg at 10/30/17 0253   Or  . acetaminophen (TYLENOL) suppository 650 mg  650 mg Rectal Q6H PRN Randel Pigg, Dorma Russell, MD      . Dimethyl Fumarate CPDR 240 mg  240 mg Oral BID Noralee Stain, DO      . feeding supplement (ENSURE ENLIVE) (ENSURE ENLIVE) liquid 237 mL  237 mL Oral TID BM Noralee Stain, DO   237 mL at 11/03/17 1036  . folic acid (FOLVITE) tablet 1 mg  1 mg Oral Daily Noralee Stain, DO   1 mg at 11/03/17 1036  . MEDLINE mouth rinse  15 mL Mouth Rinse BID Noralee Stain, DO   15 mL at 11/04/17 2243  . multivitamin with minerals tablet 1 tablet  1 tablet Oral Daily Kerin Salen, MD   1 tablet at 11/03/17 1036  . OLANZapine (ZYPREXA) injection 5 mg  5 mg Intramuscular Q8H PRN Darcel Smalling, MD   5 mg at 11/04/17 1512  . OLANZapine (ZYPREXA) tablet 5 mg  5 mg Oral BID Briant Cedar, MD   5 mg at 11/05/17 0914  . ondansetron (ZOFRAN) tablet 4 mg  4 mg Oral Q6H PRN Randel Pigg, Dorma Russell, MD       Or  . ondansetron [facility] injection 4 mg  4 mg Intravenous Q6H PRN Randel Pigg, Dorma Russell, MD   4 mg at 10/25/17 0026  . pantoprazole sodium (PROTONIX) 40 mg/20 mL oral suspension 40 mg  40 mg Per Tube BID AC Briant Cedar, MD   40 mg at 11/01/17 1734  . polyethylene glycol (MIRALAX / GLYCOLAX) packet 17 g  17 g Oral Daily Noralee Stain, DO   17 g at 11/03/17 1036  . senna-docusate (Senokot-S) tablet 1 tablet  1 tablet Oral QHS Noralee Stain, DO   1 tablet at 11/04/17 2243  . sodium chloride flush (NS) 0.9 % injection 3 mL  3 mL Intravenous Q12H Randel Pigg, Dorma Russell, MD   3 mL at 11/04/17 2243  . thiamine (VITAMIN B-1) tablet 100 mg  100 mg Oral Daily Randel Pigg, Dorma Russell, MD   100 mg at 11/03/17 1036  . traZODone (DESYREL) tablet 25 mg  25 mg Oral QHS PRN Pearson Grippe, MD   25 mg at 11/02/17 2235  . vitamin B-12 (CYANOCOBALAMIN) tablet 1,000 mcg  1,000 mcg Oral Daily Briant Cedar, MD   1,000 mcg at 11/03/17 1036    Lab Results: No results found for this or any previous visit (from the past 48 hour(s)).  Blood Alcohol level:  Lab Results  Component Value Date   ETH <5 09/23/2016   ETH 67 (H) 04/24/2016    Musculoskeletal: Strength & Muscle Tone: within normal limits Gait & Station: UTA since patient was in restraints. Patient leans: N/A  Psychiatric Specialty Exam: Physical Exam  Nursing note and vitals reviewed. Constitutional: He appears well-developed and well-nourished.  HENT:  Head: Normocephalic and atraumatic.  Neck: Normal range of motion.  Respiratory: Effort normal.  Musculoskeletal: Normal range of motion.  Neurological: He is alert.  Oriented to self.   Psychiatric: Thought content normal. His affect is labile and inappropriate. His speech is slurred. He is slowed. Cognition and memory are impaired. He expresses impulsivity.    Review of Systems  Cardiovascular: Negative for chest pain.  Psychiatric/Behavioral: Positive for  hallucinations. Negative for depression. The patient does not have insomnia.   All other systems reviewed and are negative.   Blood pressure 110/79, pulse 88, temperature 98.2 F (36.8 C), temperature source Oral, resp. rate 20, height 5\' 11"  (1.803 m), weight 68.1 kg (150 lb 2.1 oz), SpO2 98 %.Body mass index is 20.94 kg/m.  General Appearance: Disheveled, middle aged, Caucasian male, wearing a hospital gown and lying in bed. NAD.   Eye Contact:  Poor  Speech:  Slow and Slurred  Volume:  Normal  Mood:  Dysphoric  Affect:  Congruent and Labile  Thought Process:  Disorganized and Descriptions of Associations: Tangential  Orientation:  Other:  Oriented to person.  Thought Content:  Illogical  Suicidal Thoughts:  UTA due to AMS.   Homicidal Thoughts:  UTA due to AMS.   Memory:  Immediate;   Poor Recent;   Fair Remote;   Fair  Judgement:  Impaired  Insight:  Lacking  Psychomotor Activity:  Decreased  Concentration:  Concentration: Poor and Attention Span: Poor  Recall:  UTA due to AMS.  Fund of Knowledge:  Poor  Language:  Fair  Akathisia:  No  Handed:  Right  AIMS (if indicated):   N/A  Assets:  Others:  UTA due to AMS.  ADL's:  Impaired  Cognition:  Impaired due to AMS.  Sleep:   Okay   Assessment:  [name] is a 62 y.o. male who was admitted on 6/26 with symptomatic anemia in the setting of GI bleed and alcohol withdrawal. Psychiatry was consulted for agitation. He was disorganized in thought process and only oriented to person during interview today. He was also in soft restraints. Patient's mental status reportedly fluctuates which makes his presentation more consistent with delirium and is likely worsened by prolonged  hospitalization. Recommend increasing Zyprexa for agitation.   Treatment Plan Summary: -Increase Zyprexa 5 mg BID to 5 mg q am and 10 mg qhs for agitation/psychosis.  -Continue Zyprexa 5 mg q 8 hours PRN for worsening agitation.  -EKG reviewed and QTc  447. Please closely monitor when starting or increasing QTc prolonging agents.  -Psychiatry will continue to follow as needed.    Cherly Beach, DO 11/05/2017, 11:33 AM

## 2017-11-05 NOTE — Progress Notes (Signed)
OT Cancellation Note  Patient Details Name: DEANDREW SEANEY MRN: [id number] DOB: 1956-03-03   Cancelled Treatment:    Reason Eval/Treat Not Completed: Other (comment)  Spoke with RN. Pt in 5 point restraints and hitting when they are removed. Will check on pt another day.  Lise Auer, [place] [phone]  Einar Crow D 11/05/2017, 2:11 PM

## 2017-11-06 DIAGNOSIS — R4182 Altered mental status, unspecified: Secondary | ICD-10-CM

## 2017-11-06 DIAGNOSIS — E870 Hyperosmolality and hypernatremia: Secondary | ICD-10-CM

## 2017-11-06 LAB — CBC WITH DIFFERENTIAL/PLATELET
Basophils Absolute: 0 10*3/uL (ref 0.0–0.1)
Basophils Relative: 0 %
Eosinophils Absolute: 0.1 10*3/uL (ref 0.0–0.7)
Eosinophils Relative: 1 %
HCT: 38.9 % — ABNORMAL LOW (ref 39.0–52.0)
Hemoglobin: 12.2 g/dL — ABNORMAL LOW (ref 13.0–17.0)
Lymphocytes Relative: 12 %
Lymphs Abs: 0.9 10*3/uL (ref 0.7–4.0)
MCH: 30.7 pg (ref 26.0–34.0)
MCHC: 31.4 g/dL (ref 30.0–36.0)
MCV: 97.7 fL (ref 78.0–100.0)
Monocytes Absolute: 0.7 10*3/uL (ref 0.1–1.0)
Monocytes Relative: 9 %
Neutro Abs: 5.5 10*3/uL (ref 1.7–7.7)
Neutrophils Relative %: 78 %
Platelets: 376 10*3/uL (ref 150–400)
RBC: 3.98 MIL/uL — ABNORMAL LOW (ref 4.22–5.81)
RDW: 14.8 % (ref 11.5–15.5)
WBC: 7.2 10*3/uL (ref 4.0–10.5)

## 2017-11-06 LAB — BASIC METABOLIC PANEL
Anion gap: 11 (ref 5–15)
BUN: 27 mg/dL — ABNORMAL HIGH (ref 8–23)
CO2: 26 mmol/L (ref 22–32)
Calcium: 9.5 mg/dL (ref 8.9–10.3)
Chloride: 110 mmol/L (ref 98–111)
Creatinine, Ser: 1.1 mg/dL (ref 0.61–1.24)
GFR calc Af Amer: >60 mL/min (ref >60)
GFR calc non Af Amer: >60 mL/min (ref >60)
Glucose, Bld: 118 mg/dL — ABNORMAL HIGH (ref 70–99)
Potassium: 4.7 mmol/L (ref 3.5–5.1)
Sodium: 147 mmol/L — ABNORMAL HIGH (ref 135–145)

## 2017-11-06 MED ORDER — GUAIFENESIN ER 600 MG PO TB12
1200.0000 mg | ORAL_TABLET | Freq: Two times a day (BID) | ORAL | Status: DC
  Administered 2017-11-08 – 2017-11-11 (×6): 1200 mg via ORAL
  Filled 2017-11-06 (×7): qty 2

## 2017-11-06 NOTE — Progress Notes (Signed)
PROGRESS NOTE  [name]:[id number] DOB: [birth year] DOA: 10/16/2017 PCP: Rinaldo Cloud, MD  HPI/brief narrative 62 year old male with medical history significant for MS, bipolar disorder, alcohol/tobacco abuse, presented to the ER complaining of abdominal pain, nausea/vomiting for the past 2 weeks.  Patient denied any NSAID/aspirin use, no history of GI bleeding in the past.  Patient underwent EGD which revealed a large, deep duodenal bulb ulcer.  Patient underwent empiric embolization by IR on 7/1.  Hospitalization further complicated by acute encephalopathy.  Subjective:  Patient  remains confused, no significant events overnight   Assessment/Plan: Principal Problem:   Altered mental status Active Problems:   MS (multiple sclerosis) (HCC)   Bipolar affective disorder, depressed, severe (HCC)   Bipolar I disorder (HCC)   Upper GI bleeding   Symptomatic anemia   GI bleed   Acute encephalopathy   Goals of care, counseling/discussion   Palliative care encounter  Upper GI bleed due to duodenal bulb ulcer Stable Likely due to chronic alcohol abuse Positive FOBT, with hemoglobin drop GI consulted, status post EGD on 6/27 showed very large deep duodenal bulb ulcer, LA grade D esophagitis IR consulted, status post empiric embolization on 10/21/2017 Continue PPI 40 mg twice daily for 8 weeks, then 40 mg daily indefinitely Need repeat EGD in 8 weeks to document healing of esophagitis as well as large duodenal bulb ulcer.  Will follow up with GI in 6 weeks  Acute blood loss anemia Stable Likely due to above Hemoglobin at baseline 14-15, 10.1 on admission Status post 2 units of packed RBC on 7/4 Anemia panel showed iron of 77, ferritin of 122, vitamin B12 180, folate 11.4 Vitamin B12 on the low normal side, status post 1 dose of B12 given, continue PO daily  Acute encephalopathy -Remains confused, with fluctuating mental status, sometimes with agitation, this is most likely  related to hospital delirium, -No evidence of infection - CT head negative for any acute intracranial abnormalities - MRI negative for any acute changes, unchanged from 2017 - EEG consistent with mild generalized nonspecific dysfunction, encephalopathy.  No seizure or seizure predisposition recorded on the study -Greatly appreciated, currently on Zyprexa, medication has been increased yesterday, monitor QTC closely -  Monitor closely, fall precautions, on restraints, will request safety sitter at bedside  Hypernatremia -Secondary to decreased fluid intake, currently has no IV access, will encourage fluid intake, if no improvement will reinsert peripheral IV access and start on IV fluids.  Multiple sclerosis - Followed by neurology as an outpatient - On home tecfidera, pharmacy does not stock medication, patient has to bring home meds  Bipolar disorder Unsure if patient is on any meds at home Psychiatry consulted, likely delirium, unlikely manic episode from bipolar, rec med adjustment as mentioned above  Alcohol abuse Last alcoholic drink was over 1 week ago PTA    Code Status: Full  Family Communication: None at bedside  Disposition Plan: SNF, once off  restraints   Consultants:  GI  IR  Psychiatry  Procedures:  EGD  Embolization by IR  Antimicrobials:  Fosfomycin X 1 dose  DVT prophylaxis: SCDs, not on chemical prophylaxis given recent deep gastric ulcer with active bleed   Objective: Vitals:   11/05/17 1100 11/05/17 1510 11/05/17 2153 11/06/17 0607  BP:  131/86 116/87 105/81  Pulse:  (!) 102 99 (!) 113  Resp:  17 20 19   Temp:  98.2 F (36.8 C) 98.7 F (37.1 C) 98.2 F (36.8 C)  TempSrc:  Oral  SpO2:  99%  94%  Weight: 68.4 kg (150 lb 12.7 oz)   67 kg (147 lb 11.3 oz)  Height:        Intake/Output Summary (Last 24 hours) at 11/06/2017 1327 Last data filed at 11/06/2017 9811 Gross per 24 hour  Intake 0 ml  Output 775 ml  Net -775 ml   Filed  Weights   11/04/17 0537 11/05/17 1100 11/06/17 0607  Weight: 68.1 kg (150 lb 2.1 oz) 68.4 kg (150 lb 12.7 oz) 67 kg (147 lb 11.3 oz)    Exam:  Patient laying in the bed wearing sunglasses, in no apparent distress, comfortable, still on 4 points restraint. Symmetrical Chest wall movement, Good air movement bilaterally, CTAB RRR,No Gallops,Rubs or new Murmurs, No Parasternal Heave +ve B.Sounds, Abd Soft, No tenderness, No rebound - guarding or rigidity. No Cyanosis, Clubbing or edema, No new Rash or bruise   Patient is confused,   Data Reviewed: CBC: Recent Labs  Lab 10/31/17 0629 11/01/17 0552 11/02/17 0514 11/06/17 0610  WBC 3.8* 3.9* 3.9* 7.2  NEUTROABS 2.5 2.7 2.6 5.5  HGB 11.5* 11.9* 11.3* 12.2*  HCT 36.3* 37.6* 35.7* 38.9*  MCV 97.8 97.9 97.3 97.7  PLT 352 395 378 376   Basic Metabolic Panel: Recent Labs  Lab 10/31/17 0629 11/01/17 0552 11/02/17 0514 11/06/17 0610  NA 142 142 143 147*  K 4.3 4.1 4.1 4.7  CL 111 109 109 110  CO2 23 24 26 26   GLUCOSE 86 86 92 118*  BUN 9 10 15  27*  CREATININE 0.91 1.00 1.15 1.10  CALCIUM 8.9 9.2 9.1 9.5   GFR: Estimated Creatinine Clearance: 66 mL/min (by C-G formula based on SCr of 1.1 mg/dL). Liver Function Tests: No results for input(s): AST, ALT, ALKPHOS, BILITOT, PROT, ALBUMIN in the last 168 hours. No results for input(s): LIPASE, AMYLASE in the last 168 hours. No results for input(s): AMMONIA in the last 168 hours. Coagulation Profile: No results for input(s): INR, PROTIME in the last 168 hours. Cardiac Enzymes: No results for input(s): CKTOTAL, CKMB, CKMBINDEX, TROPONINI in the last 168 hours. BNP (last 3 results) No results for input(s): PROBNP in the last 8760 hours. HbA1C: No results for input(s): HGBA1C in the last 72 hours. CBG: No results for input(s): GLUCAP in the last 168 hours. Lipid Profile: No results for input(s): CHOL, HDL, LDLCALC, TRIG, CHOLHDL, LDLDIRECT in the last 72 hours. Thyroid Function  Tests: No results for input(s): TSH, T4TOTAL, FREET4, T3FREE, THYROIDAB in the last 72 hours. Anemia Panel: No results for input(s): VITAMINB12, FOLATE, FERRITIN, TIBC, IRON, RETICCTPCT in the last 72 hours. Urine analysis:    Component Value Date/Time   COLORURINE YELLOW 11/01/2017 0208   APPEARANCEUR CLEAR 11/01/2017 0208   LABSPEC 1.015 11/01/2017 0208   PHURINE 6.0 11/01/2017 0208   GLUCOSEU NEGATIVE 11/01/2017 0208   HGBUR NEGATIVE 11/01/2017 0208   BILIRUBINUR NEGATIVE 11/01/2017 0208   KETONESUR NEGATIVE 11/01/2017 0208   PROTEINUR NEGATIVE 11/01/2017 0208   UROBILINOGEN 0.2 07/08/2013 0930   NITRITE POSITIVE (A) 11/01/2017 0208   LEUKOCYTESUR NEGATIVE 11/01/2017 0208   Sepsis Labs: @LABRCNTIP (procalcitonin:4,lacticidven:4)  ) Recent Results (from the past 240 hour(s))  Urine Culture     Status: Abnormal   Collection Time: 11/01/17  8:50 AM  Result Value Ref Range Status   Specimen Description   Final    URINE, RANDOM Performed at [facility], [address] [place], [place] [address]    Special Requests  Final    NONE Performed at [facility], [address], [place], [place] [address]    Culture MULTIPLE SPECIES PRESENT, SUGGEST RECOLLECTION (A)  Final   Report Status 11/02/2017 FINAL  Final  Urine Culture     Status: Abnormal   Collection Time: 11/02/17  3:45 PM  Result Value Ref Range Status   Specimen Description   Final    URINE, RANDOM Performed at [facility], [address], [place], [place] [address]    Special Requests   Final    NONE Performed at [facility], [address] [place], [place] [address]    Culture MULTIPLE SPECIES PRESENT, SUGGEST RECOLLECTION (A)  Final   Report Status 11/03/2017 FINAL  Final      Studies: No results found.  Scheduled Meds: . Dimethyl Fumarate  240 mg Oral BID  . feeding supplement (ENSURE ENLIVE)  237 mL Oral TID BM  . folic  acid  1 mg Oral Daily  . guaiFENesin  1,200 mg Oral BID  . mouth rinse  15 mL Mouth Rinse BID  . multivitamin with minerals  1 tablet Oral Daily  . OLANZapine  10 mg Oral QHS  . OLANZapine  5 mg Oral Daily  . pantoprazole sodium  40 mg Per Tube BID AC  . polyethylene glycol  17 g Oral Daily  . senna-docusate  1 tablet Oral QHS  . sodium chloride flush  3 mL Intravenous Q12H  . thiamine  100 mg Oral Daily  . vitamin B-12  1,000 mcg Oral Daily    Continuous Infusions:   LOS: 19 days     Huey Bienenstock, MD [facility] Pager [phone]   If 7PM-7AM, please contact night-coverage [email or web address] Password [email or web address] 11/06/2017, 1:27 PM

## 2017-11-06 NOTE — Progress Notes (Addendum)
Patient remains in restraints and not ready for dc.  LCSW will continue to follow for dc planning.   Beulah Gandy [facility] [place] [facility] CSW [phone]

## 2017-11-07 LAB — BASIC METABOLIC PANEL
Anion gap: 11 (ref 5–15)
BUN: 28 mg/dL — ABNORMAL HIGH (ref 8–23)
CO2: 21 mmol/L — ABNORMAL LOW (ref 22–32)
Calcium: 9 mg/dL (ref 8.9–10.3)
Chloride: 112 mmol/L — ABNORMAL HIGH (ref 98–111)
Creatinine, Ser: 0.95 mg/dL (ref 0.61–1.24)
GFR calc Af Amer: >60 mL/min (ref >60)
GFR calc non Af Amer: >60 mL/min (ref >60)
Glucose, Bld: 114 mg/dL — ABNORMAL HIGH (ref 70–99)
Potassium: 4.8 mmol/L (ref 3.5–5.1)
Sodium: 144 mmol/L (ref 135–145)

## 2017-11-07 LAB — CBC
HCT: 38.1 % — ABNORMAL LOW (ref 39.0–52.0)
Hemoglobin: 11.9 g/dL — ABNORMAL LOW (ref 13.0–17.0)
MCH: 30.2 pg (ref 26.0–34.0)
MCHC: 31.2 g/dL (ref 30.0–36.0)
MCV: 96.7 fL (ref 78.0–100.0)
Platelets: 339 10*3/uL (ref 150–400)
RBC: 3.94 MIL/uL — ABNORMAL LOW (ref 4.22–5.81)
RDW: 14.5 % (ref 11.5–15.5)
WBC: 6 10*3/uL (ref 4.0–10.5)

## 2017-11-07 MED ORDER — CYANOCOBALAMIN 1000 MCG/ML IJ SOLN
1000.0000 ug | Freq: Every day | INTRAMUSCULAR | Status: AC
  Administered 2017-11-07 – 2017-11-09 (×2): 1000 ug via SUBCUTANEOUS
  Filled 2017-11-07 (×3): qty 1

## 2017-11-07 NOTE — Progress Notes (Signed)
LCSW spoke to patient's ex wife and contact [name].   [name] is agreeable to SNF for rehab at dc. She prefers patient go to [facility] or [facility].   LCSW explained that patient needs to be out of restraints for 24 hours before a facility will accept him.   [name] expressed concerns about patients ability to care for himself after rehab and questions if patient needs long term care considering his current state. LCSW explained that at this time patient is considered able to rehab and would be reevaluated at the facility to determine dc needs once rehab is complete.   LCSW will continue to follow for dc needs.   Beulah Gandy [place] [place] [facility] CSW [phone]

## 2017-11-07 NOTE — Progress Notes (Signed)
PROGRESS NOTE  [name] ZOX:[id number] DOB: 08-18-1955 DOA: 10/16/2017 PCP: Rinaldo Cloud, MD  HPI/brief narrative 62 year old male with medical history significant for MS, bipolar disorder, alcohol/tobacco abuse, presented to the ER complaining of abdominal pain, nausea/vomiting for the past 2 weeks.  Patient denied any NSAID/aspirin use, no history of GI bleeding in the past.  Patient underwent EGD which revealed a large, deep duodenal bulb ulcer.  Patient underwent empiric embolization by IR on 7/1.  Hospitalization further complicated by acute encephalopathy.  Subjective: Patient  remains confused, no significant events overnight  Assessment/Plan: Principal Problem:   Altered mental status Active Problems:   MS (multiple sclerosis) (HCC)   Bipolar affective disorder, depressed, severe (HCC)   Bipolar I disorder (HCC)   Upper GI bleeding   Symptomatic anemia   GI bleed   Acute encephalopathy   Goals of care, counseling/discussion   Palliative care encounter  Upper GI bleed due to duodenal bulb ulcer Stable Likely due to chronic alcohol abuse Positive FOBT, with hemoglobin drop GI consulted, status post EGD on 6/27 showed very large deep duodenal bulb ulcer, LA grade D esophagitis IR consulted, status post empiric embolization on 10/21/2017 Continue PPI 40 mg twice daily for 8 weeks, then 40 mg daily indefinitely Need repeat EGD in 8 weeks to document healing of esophagitis as well as large duodenal bulb ulcer.  Will follow up with GI in 6 weeks  Acute blood loss anemia Stable Likely due to above Hemoglobin at baseline 14-15, 10.1 on admission Status post 2 units of packed RBC on 7/4 Anemia panel showed iron of 77, ferritin of 122, vitamin B12 180, folate 11.4 Vitamin B12 on the low normal side, status post 1 dose of B12 given, continue PO daily  Acute encephalopathy/delirium -Remains confused, with fluctuating mental status, sometimes with agitation, this is most  likely related to hospital delirium, -No evidence of infection - CT head negative for any acute intracranial abnormalities - MRI negative for any acute changes, unchanged from 2017 - EEG consistent with mild generalized nonspecific dysfunction, encephalopathy.  No seizure or seizure predisposition recorded on the study -Patient with low B12 level, is being repleted with p.o., give him 3 doses of IM -Catheter consulted to help manage his symptoms of confusion and agitation, he is on Zyprexa 5 during daytime, 10 mg at bedtime, with PRN dosing as well, his QTC is 459 today , he was more lethargic yesterday during daytime when we try to work with him and feed him, so I will DC daytime Zyprexa , continue with nighttime . -Overall slightly improved over last 24 hours, have discontinued restraints  Hypernatremia -Secondary to decreased fluid intake, currently has no IV access, I have encouraged to increase his fluid intake yesterday, it has improved went down from 1 47-1 44 this morning .  Multiple sclerosis - Followed by neurology as an outpatient - On home tecfidera, pharmacy does not stock medication, patient has to bring home meds  Bipolar disorder Unsure if patient is on any meds at home Psychiatry consulted, likely delirium, unlikely manic episode from bipolar, rec med adjustment as mentioned above  Alcohol abuse Last alcoholic drink was over 1 week ago PTA  Protein calorie malnutrition -BMI of 20, with significant muscle wasting, continue with supplements  Code Status: Full  Family Communication: None at bedside  Disposition Plan: SNF, once off  restraints   Consultants:  GI  IR  Psychiatry  Procedures:  EGD  Embolization by IR  Antimicrobials:  Fosfomycin X 1 dose  DVT prophylaxis: SCDs, not on chemical prophylaxis given recent deep gastric ulcer with active bleed   Objective: Vitals:   11/06/17 1528 11/06/17 1947 11/07/17 0513 11/07/17 1412  BP: 120/74 (!)  121/98 103/70 (!) 159/125  Pulse: 88 (!) 105 75 (!) 113  Resp: (!) 24 (!) 21 20 16   Temp: 98 F (36.7 C) 98.1 F (36.7 C) 97.9 F (36.6 C) 98.5 F (36.9 C)  TempSrc: Oral Oral Oral   SpO2: 93% 93% 96% 92%  Weight:      Height:        Intake/Output Summary (Last 24 hours) at 11/07/2017 1503 Last data filed at 11/07/2017 1118 Gross per 24 hour  Intake 0 ml  Output 650 ml  Net -650 ml   Filed Weights   11/04/17 0537 11/05/17 1100 11/06/17 0607  Weight: 68.1 kg (150 lb 2.1 oz) 68.4 kg (150 lb 12.7 oz) 67 kg (147 lb 11.3 oz)    Exam:  Patient laying in bed wearing sunglasses, confused, but he appears comfortable, no restraint  Good air entry bilaterally, no wheezing rales rhonchi  RRR,No Gallops,Rubs or new Murmurs, No Parasternal Heave Abdomen soft, nontender, nondistended No Cyanosis, Clubbing or edema,, significant muscle wasting Data Reviewed: CBC: Recent Labs  Lab 11/01/17 0552 11/02/17 0514 11/06/17 0610 11/07/17 0655  WBC 3.9* 3.9* 7.2 6.0  NEUTROABS 2.7 2.6 5.5  --   HGB 11.9* 11.3* 12.2* 11.9*  HCT 37.6* 35.7* 38.9* 38.1*  MCV 97.9 97.3 97.7 96.7  PLT 395 378 376 339   Basic Metabolic Panel: Recent Labs  Lab 11/01/17 0552 11/02/17 0514 11/06/17 0610 11/07/17 0655  NA 142 143 147* 144  K 4.1 4.1 4.7 4.8  CL 109 109 110 112*  CO2 24 26 26  21*  GLUCOSE 86 92 118* 114*  BUN 10 15 27* 28*  CREATININE 1.00 1.15 1.10 0.95  CALCIUM 9.2 9.1 9.5 9.0   GFR: Estimated Creatinine Clearance: 76.4 mL/min (by C-G formula based on SCr of 0.95 mg/dL). Liver Function Tests: No results for input(s): AST, ALT, ALKPHOS, BILITOT, PROT, ALBUMIN in the last 168 hours. No results for input(s): LIPASE, AMYLASE in the last 168 hours. No results for input(s): AMMONIA in the last 168 hours. Coagulation Profile: No results for input(s): INR, PROTIME in the last 168 hours. Cardiac Enzymes: No results for input(s): CKTOTAL, CKMB, CKMBINDEX, TROPONINI in the last 168  hours. BNP (last 3 results) No results for input(s): PROBNP in the last 8760 hours. HbA1C: No results for input(s): HGBA1C in the last 72 hours. CBG: No results for input(s): GLUCAP in the last 168 hours. Lipid Profile: No results for input(s): CHOL, HDL, LDLCALC, TRIG, CHOLHDL, LDLDIRECT in the last 72 hours. Thyroid Function Tests: No results for input(s): TSH, T4TOTAL, FREET4, T3FREE, THYROIDAB in the last 72 hours. Anemia Panel: No results for input(s): VITAMINB12, FOLATE, FERRITIN, TIBC, IRON, RETICCTPCT in the last 72 hours. Urine analysis:    Component Value Date/Time   COLORURINE YELLOW 11/01/2017 0208   APPEARANCEUR CLEAR 11/01/2017 0208   LABSPEC 1.015 11/01/2017 0208   PHURINE 6.0 11/01/2017 0208   GLUCOSEU NEGATIVE 11/01/2017 0208   HGBUR NEGATIVE 11/01/2017 0208   BILIRUBINUR NEGATIVE 11/01/2017 0208   KETONESUR NEGATIVE 11/01/2017 0208   PROTEINUR NEGATIVE 11/01/2017 0208   UROBILINOGEN 0.2 07/08/2013 0930   NITRITE POSITIVE (A) 11/01/2017 0208   LEUKOCYTESUR NEGATIVE 11/01/2017 0208   Sepsis Labs: @LABRCNTIP (procalcitonin:4,lacticidven:4)  ) Recent Results (from the past 240 hour(s))  Urine Culture     Status: Abnormal   Collection Time: 11/01/17  8:50 AM  Result Value Ref Range Status   Specimen Description   Final    URINE, RANDOM Performed at [facility], [address] [place], [place] [address]    Special Requests   Final    NONE Performed at [facility], [address] [place], [place] [address]    Culture MULTIPLE SPECIES PRESENT, SUGGEST RECOLLECTION (A)  Final   Report Status 11/02/2017 FINAL  Final  Urine Culture     Status: Abnormal   Collection Time: 11/02/17  3:45 PM  Result Value Ref Range Status   Specimen Description   Final    URINE, RANDOM Performed at [facility], [address] [place], [place] [address]    Special Requests   Final    NONE Performed at [facility], [address], [place], [place] [address]    Culture MULTIPLE SPECIES PRESENT, SUGGEST RECOLLECTION (A)  Final   Report Status 11/03/2017 FINAL  Final      Studies: No results found.  Scheduled Meds: . Dimethyl Fumarate  240 mg Oral BID  . feeding supplement (ENSURE ENLIVE)  237 mL Oral TID BM  . folic acid  1 mg Oral Daily  . guaiFENesin  1,200 mg Oral BID  . mouth rinse  15 mL Mouth Rinse BID  . multivitamin with minerals  1 tablet Oral Daily  . OLANZapine  10 mg Oral QHS  . pantoprazole sodium  40 mg Per Tube BID AC  . polyethylene glycol  17 g Oral Daily  . senna-docusate  1 tablet Oral QHS  . sodium chloride flush  3 mL Intravenous Q12H  . thiamine  100 mg Oral Daily  . vitamin B-12  1,000 mcg Oral Daily    Continuous Infusions:   LOS: 20 days     Huey Bienenstock, MD [facility] Pager [phone]   If 7PM-7AM, please contact night-coverage [email or web address] Password [facility] 11/07/2017, 3:03 PM

## 2017-11-07 NOTE — Progress Notes (Signed)
Occupational Therapy Treatment Patient Details Name: [name] MRN: [id number] DOB: [birth year] Today's Date: 11/07/2017    History of present illness [name] is a 62 y.o. male with medical history significant of MS (follows with Dr. Felecia Shelling), bipolar disorder, alcohol and tobacco abuse, presented to the emergency department complaining of abdominal pain with nausea and vomiting.  Patient is poor historian.  He reports to 2-3 days of abdominal pain associated with nausea and vomiting.  Patient reported epigastric pain started approximately 2 weeks ago which has been worse with eating.  Denies diarrhea, constipation and fever or chills.  Last alcohol use 3 days ago.  Normally drinks occasionally but can drink up to 12 pack a day.  Other associated symptoms include weakness/faint.  Denies NSAID/ASA use, no history of GI bleeding in the past.  Patient denies dark stools.  Patient underwent EGD which revealed large, deep duodenal bulb ulcer.  Patient underwent empiric embolization by IR on 7/1.  Hospitalization further complicated by altered mental status.   OT comments  Pt agreeable to sit EOB and eat lunch - pt then fatigued and wanted to lie down.  Pt did hold spoon and ice cream cup with minA  Follow Up Recommendations  SNF    Equipment Recommendations  None recommended by OT    Recommendations for Other Services      Precautions / Restrictions Precautions Precautions: Fall       Mobility Bed Mobility   Bed Mobility: Sit to Supine;Supine to Sit     Supine to sit: Min assist Sit to supine: Min assist Sit to sidelying: Mod assist;HOB elevated;Min assist    Transfers                 General transfer comment: did not perform    Balance Overall balance assessment: History of Falls;Needs assistance   Sitting balance-Leahy Scale: Poor     Standing balance support: Bilateral upper extremity supported;Single extremity supported Standing balance-Leahy Scale:  Poor Standing balance comment: reliant on UEs and  external support                           ADL either performed or assessed with clinical judgement   ADL Overall ADL's : Needs assistance/impaired Eating/Feeding: Sitting;Minimal assistance Eating/Feeding Details (indicate cue type and reason): sitting EOB then transitioning to supine wiht HOB raised                                   General ADL Comments: pt agreeable to OT then lunch came.  Pt agreed to try to feed himself (he has not been doing this last few days)     Vision Baseline Vision/History: No visual deficits            Cognition Arousal/Alertness: Awake/alert   Overall Cognitive Status: No family/caregiver present to determine baseline cognitive functioning Area of Impairment: Memory;Awareness;Safety/judgement;Problem solving                   Current Attention Level: Divided Memory: Decreased recall of precautions Following Commands: Follows one step commands consistently;Follows multi-step commands inconsistently Safety/Judgement: Decreased awareness of safety;Decreased awareness of deficits   Problem Solving: Slow processing;Decreased initiation;Difficulty sequencing;Requires verbal cues;Requires tactile cues                     Pertinent Vitals/ Pain  Pain Assessment: No/denies pain     Prior Functioning/Environment              Frequency  Min 2X/week        Progress Toward Goals  OT Goals(current goals can now be found in the care plan section)  Progress towards OT goals: OT to reassess next treatment     Plan All goals met and education completed, patient discharged from OT services       AM-PAC PT "6 Clicks" Daily Activity     Outcome Measure   Help from another person eating meals?: A Lot Help from another person taking care of personal grooming?: A Lot Help from another person toileting, which includes using toliet, bedpan, or urinal?:  Total Help from another person bathing (including washing, rinsing, drying)?: A Lot Help from another person to put on and taking off regular upper body clothing?: Total Help from another person to put on and taking off regular lower body clothing?: Total 6 Click Score: 9    End of Session    OT Visit Diagnosis: Unsteadiness on feet (R26.81);Other abnormalities of gait and mobility (R26.89);Muscle weakness (generalized) (M62.81);History of falling (Z91.81)   Activity Tolerance Patient limited by fatigue   Patient Left with bed alarm set   Nurse Communication Mobility status        Time: 8891-6945 OT Time Calculation (min): 11 min  Charges: OT Treatments $Self Care/Home Management : 8-22 mins  Kari Baars, [place]   Payton Mccallum D 11/07/2017, 6:04 PM

## 2017-11-07 NOTE — Progress Notes (Signed)
Physical Therapy Treatment Patient Details Name: [name] MRN: [id number] DOB: 01-06-1956 Today's Date: 11/07/2017    History of Present Illness [name] is a 62 y.o. male with medical history significant of MS (follows with Dr. Epimenio Foot), bipolar disorder, alcohol and tobacco abuse, presented to the emergency department complaining of abdominal pain with nausea and vomiting.  Patient is poor historian.  He reports to 2-3 days of abdominal pain associated with nausea and vomiting.  Patient reported epigastric pain started approximately 2 weeks ago which has been worse with eating.  Denies diarrhea, constipation and fever or chills.  Last alcohol use 3 days ago.  Normally drinks occasionally but can drink up to 12 pack a day.  Other associated symptoms include weakness/faint.  Denies NSAID/ASA use, no history of GI bleeding in the past.  Patient denies dark stools.  Patient underwent EGD which revealed large, deep duodenal bulb ulcer.  Patient underwent empiric embolization by IR on 7/1.  Hospitalization further complicated by altered mental status.    PT Comments    Pt restraint free and awake.  Assisted to EOB only due to limited cognition.    Follow Up Recommendations  SNF     Equipment Recommendations  None recommended by PT    Recommendations for Other Services       Precautions / Restrictions Restrictions Weight Bearing Restrictions: No    Mobility  Bed Mobility Overal bed mobility: Needs Assistance Bed Mobility: Sit to Supine;Supine to Sit     Supine to sit: +2 for physical assistance;+2 for safety/equipment;Max assist Sit to supine: +2 for physical assistance;+2 for safety/equipment;Max assist   General bed mobility comments: + 2 assist to achieve sitting EOB and back to supine  Transfers                 General transfer comment: unable to attempt pt non compliant  Ambulation/Gait                 Stairs             Wheelchair Mobility     Modified Rankin (Stroke Patients Only)       Balance                                            Cognition Arousal/Alertness: Awake/alert Behavior During Therapy: Flat affect                                   General Comments: eyes fixed and agitated    non compliant with any instructions      Exercises      General Comments        Pertinent Vitals/Pain Pain Assessment: No/denies pain    Home Living                      Prior Function            PT Goals (current goals can now be found in the care plan section) Progress towards PT goals: Progressing toward goals    Frequency    Min 2X/week      PT Plan Current plan remains appropriate    Co-evaluation              AM-PAC PT "6 Clicks" Daily Activity  Outcome Measure  Difficulty turning over in bed (including adjusting bedclothes, sheets and blankets)?: A Lot Difficulty moving from lying on back to sitting on the side of the bed? : A Lot Difficulty sitting down on and standing up from a chair with arms (e.g., wheelchair, bedside commode, etc,.)?: A Lot Help needed moving to and from a bed to chair (including a wheelchair)?: A Lot Help needed walking in hospital room?: A Lot Help needed climbing 3-5 steps with a railing? : A Lot 6 Click Score: 12    End of Session Equipment Utilized During Treatment: Gait belt Activity Tolerance: Patient tolerated treatment well Patient left: in bed;with call bell/phone within reach;with bed alarm set;with restraints reapplied   PT Visit Diagnosis: Difficulty in walking, not elsewhere classified (R26.2);Unsteadiness on feet (R26.81)     Time: 1610-9604 PT Time Calculation (min) (ACUTE ONLY): 14 min  Charges:  $Therapeutic Activity: 8-22 mins                    G Codes:       Felecia Shelling  PTA [facility]  Acute  Rehab Pager      [phone]

## 2017-11-08 NOTE — Progress Notes (Signed)
PROGRESS NOTE  [name]:[id number] DOB: November 14, 1955 DOA: 10/16/2017 PCP: Rinaldo Cloud, MD  HPI/brief narrative 62 year old male with medical history significant for MS, bipolar disorder, alcohol/tobacco abuse, presented to the ER complaining of abdominal pain, nausea/vomiting for the past 2 weeks.  Patient denied any NSAID/aspirin use, no history of GI bleeding in the past.  Patient underwent EGD which revealed a large, deep duodenal bulb ulcer.  Patient underwent empiric embolization by IR on 7/1.  Hospitalization further complicated by acute encephalopathy.  Subjective: Patient  remains confused, no significant events overnight  Assessment/Plan: Principal Problem:   Altered mental status Active Problems:   MS (multiple sclerosis) (HCC)   Bipolar affective disorder, depressed, severe (HCC)   Bipolar I disorder (HCC)   Upper GI bleeding   Symptomatic anemia   GI bleed   Acute encephalopathy   Goals of care, counseling/discussion   Palliative care encounter  Upper GI bleed due to duodenal bulb ulcer Stable Likely due to chronic alcohol abuse Positive FOBT, with hemoglobin drop GI consulted, status post EGD on 6/27 showed very large deep duodenal bulb ulcer, LA grade D esophagitis IR consulted, status post empiric embolization on 10/21/2017 Continue PPI 40 mg twice daily for 8 weeks, then 40 mg daily indefinitely Need repeat EGD in 8 weeks to document healing of esophagitis as well as large duodenal bulb ulcer.  Will follow up with GI in 6 weeks  Acute blood loss anemia Stable Likely due to above Hemoglobin at baseline 14-15, 10.1 on admission Status post 2 units of packed RBC on 7/4 Anemia panel showed iron of 77, ferritin of 122, vitamin B12 180, folate 11.4 Vitamin B12 on the low normal side, status post 1 dose of B12 given, continue PO daily  Acute encephalopathy/delirium -Remains confused, with fluctuating mental status,  this is most likely related to hospital  delirium, he is improving, less agitated now, off restraints for last 24 hours, -No evidence of infection - CT head negative for any acute intracranial abnormalities - MRI negative for any acute changes, unchanged from 2017 - EEG consistent with mild generalized nonspecific dysfunction, encephalopathy.  No seizure or seizure predisposition recorded on the study -Patient with low B12 level, is being repleted with p.o., will give him 3 doses of IM -Catheter consulted to help manage his symptoms of confusion and agitation, he is on Zyprexa 5 during daytime, 10 mg at bedtime, with PRN dosing ,he is more awake today, but more confused , he required one dose of prn zyprexa.  Hypernatremia -Secondary to decreased fluid intake, intake has improved and fluid intake has improved as well  Multiple sclerosis - Followed by neurology as an outpatient - On home tecfidera, pharmacy does not stock medication, patient has to bring home meds  Bipolar disorder Unsure if patient is on any meds at home Psychiatry consulted, likely delirium, unlikely manic episode from bipolar, rec med adjustment as mentioned above  Alcohol abuse Last alcoholic drink was over 1 week ago PTA  Protein calorie malnutrition -BMI of 20, with significant muscle wasting, continue with supplements  Code Status: Full  Family Communication: None at bedside  Disposition Plan: SNF,    Consultants:  GI  IR  Psychiatry  Procedures:  EGD  Embolization by IR  Antimicrobials:  Fosfomycin X 1 dose  DVT prophylaxis: SCDs, not on chemical prophylaxis given recent deep gastric ulcer with active bleed   Objective: Vitals:   11/07/17 1412 11/07/17 1931 11/08/17 0505 11/08/17 1353  BP: (!) 159/125  121/81 112/74 130/80  Pulse: (!) 113 93 77 (!) 102  Resp: 16 20 (!) 26   Temp: 98.5 F (36.9 C) 97.6 F (36.4 C) 98 F (36.7 C)   TempSrc:  Oral    SpO2: 92% 93% 95% 100%  Weight:      Height:        Intake/Output  Summary (Last 24 hours) at 11/08/2017 1514 Last data filed at 11/08/2017 1235 Gross per 24 hour  Intake 300 ml  Output -  Net 300 ml   Filed Weights   11/04/17 0537 11/05/17 1100 11/06/17 0607  Weight: 68.1 kg (150 lb 2.1 oz) 68.4 kg (150 lb 12.7 oz) 67 kg (147 lb 11.3 oz)    Exam:  She is laying in bed, no significant events overnight  Good air entry bilaterally, no wheezing  Rate and rhythm, no rubs murmurs gallops  Soft, nontender, nondistended  No Cyanosis, Clubbing or edema,, significant muscle wasting Data Reviewed: CBC: Recent Labs  Lab 11/02/17 0514 11/06/17 0610 11/07/17 0655  WBC 3.9* 7.2 6.0  NEUTROABS 2.6 5.5  --   HGB 11.3* 12.2* 11.9*  HCT 35.7* 38.9* 38.1*  MCV 97.3 97.7 96.7  PLT 378 376 339   Basic Metabolic Panel: Recent Labs  Lab 11/02/17 0514 11/06/17 0610 11/07/17 0655  NA 143 147* 144  K 4.1 4.7 4.8  CL 109 110 112*  CO2 26 26 21*  GLUCOSE 92 118* 114*  BUN 15 27* 28*  CREATININE 1.15 1.10 0.95  CALCIUM 9.1 9.5 9.0   GFR: Estimated Creatinine Clearance: 76.4 mL/min (by C-G formula based on SCr of 0.95 mg/dL). Liver Function Tests: No results for input(s): AST, ALT, ALKPHOS, BILITOT, PROT, ALBUMIN in the last 168 hours. No results for input(s): LIPASE, AMYLASE in the last 168 hours. No results for input(s): AMMONIA in the last 168 hours. Coagulation Profile: No results for input(s): INR, PROTIME in the last 168 hours. Cardiac Enzymes: No results for input(s): CKTOTAL, CKMB, CKMBINDEX, TROPONINI in the last 168 hours. BNP (last 3 results) No results for input(s): PROBNP in the last 8760 hours. HbA1C: No results for input(s): HGBA1C in the last 72 hours. CBG: No results for input(s): GLUCAP in the last 168 hours. Lipid Profile: No results for input(s): CHOL, HDL, LDLCALC, TRIG, CHOLHDL, LDLDIRECT in the last 72 hours. Thyroid Function Tests: No results for input(s): TSH, T4TOTAL, FREET4, T3FREE, THYROIDAB in the last 72  hours. Anemia Panel: No results for input(s): VITAMINB12, FOLATE, FERRITIN, TIBC, IRON, RETICCTPCT in the last 72 hours. Urine analysis:    Component Value Date/Time   COLORURINE YELLOW 11/01/2017 0208   APPEARANCEUR CLEAR 11/01/2017 0208   LABSPEC 1.015 11/01/2017 0208   PHURINE 6.0 11/01/2017 0208   GLUCOSEU NEGATIVE 11/01/2017 0208   HGBUR NEGATIVE 11/01/2017 0208   BILIRUBINUR NEGATIVE 11/01/2017 0208   KETONESUR NEGATIVE 11/01/2017 0208   PROTEINUR NEGATIVE 11/01/2017 0208   UROBILINOGEN 0.2 07/08/2013 0930   NITRITE POSITIVE (A) 11/01/2017 0208   LEUKOCYTESUR NEGATIVE 11/01/2017 0208   Sepsis Labs: @LABRCNTIP (procalcitonin:4,lacticidven:4)  ) Recent Results (from the past 240 hour(s))  Urine Culture     Status: Abnormal   Collection Time: 11/01/17  8:50 AM  Result Value Ref Range Status   Specimen Description   Final    URINE, RANDOM Performed at [facility], [address] [place], [place] [address]    Special Requests   Final    NONE Performed at [facility], [address] [place], [place] [address]    Culture MULTIPLE SPECIES PRESENT, SUGGEST RECOLLECTION (A)  Final   Report Status 11/02/2017 FINAL  Final  Urine Culture     Status: Abnormal   Collection Time: 11/02/17  3:45 PM  Result Value Ref Range Status   Specimen Description   Final    URINE, RANDOM Performed at [facility], [address], [place], [place] [address]    Special Requests   Final    NONE Performed at [facility], [address] [place], [place] [address]    Culture MULTIPLE SPECIES PRESENT, SUGGEST RECOLLECTION (A)  Final   Report Status 11/03/2017 FINAL  Final      Studies: No results found.  Scheduled Meds: . cyanocobalamin  1,000 mcg Subcutaneous Daily  . Dimethyl Fumarate  240 mg Oral BID  . feeding supplement (ENSURE ENLIVE)  237 mL Oral TID BM  . folic acid  1 mg Oral Daily  . guaiFENesin   1,200 mg Oral BID  . mouth rinse  15 mL Mouth Rinse BID  . multivitamin with minerals  1 tablet Oral Daily  . OLANZapine  10 mg Oral QHS  . pantoprazole sodium  40 mg Per Tube BID AC  . polyethylene glycol  17 g Oral Daily  . senna-docusate  1 tablet Oral QHS  . sodium chloride flush  3 mL Intravenous Q12H  . thiamine  100 mg Oral Daily  . vitamin B-12  1,000 mcg Oral Daily    Continuous Infusions:   LOS: 21 days     Huey Bienenstock, MD [facility] Pager [phone]   If 7PM-7AM, please contact night-coverage [email or web address] Password [facility] 11/08/2017, 3:14 PM

## 2017-11-09 MED ORDER — CYANOCOBALAMIN 1000 MCG/ML IJ SOLN
1000.0000 ug | Freq: Every day | INTRAMUSCULAR | Status: DC
  Administered 2017-11-10: 1000 ug via SUBCUTANEOUS
  Filled 2017-11-09 (×2): qty 1

## 2017-11-09 NOTE — Progress Notes (Addendum)
PROGRESS NOTE  [name] OZH:[id number] DOB: [birth year] DOA: 10/16/2017 PCP: Rinaldo Cloud, MD  HPI/brief narrative 61 year old male with medical history significant for MS, bipolar disorder, alcohol/tobacco abuse, presented to the ER complaining of abdominal pain, nausea/vomiting for the past 2 weeks.  Patient denied any NSAID/aspirin use, no history of GI bleeding in the past.  Patient underwent EGD which revealed a large, deep duodenal bulb ulcer.  Patient underwent empiric embolization by IR on 7/1.  Hospitalization further complicated by acute encephalopathy.  Subjective: Patient  remains confused, but no significant events overnight, required 1 dose of Zyprexa yesterday afternoon, but nothing since.  Assessment/Plan: Principal Problem:   Altered mental status Active Problems:   MS (multiple sclerosis) (HCC)   Bipolar affective disorder, depressed, severe (HCC)   Bipolar I disorder (HCC)   Upper GI bleeding   Symptomatic anemia   GI bleed   Acute encephalopathy   Goals of care, counseling/discussion   Palliative care encounter  Upper GI bleed due to duodenal bulb ulcer Stable Likely due to chronic alcohol abuse Positive FOBT, with hemoglobin drop GI consulted, status post EGD on 6/27 showed very large deep duodenal bulb ulcer, LA grade D esophagitis IR consulted, status post empiric embolization on 10/21/2017 Continue PPI 40 mg twice daily for 8 weeks, then 40 mg daily indefinitely Need repeat EGD in 8 weeks to document healing of esophagitis as well as large duodenal bulb ulcer.  Will follow up with GI in 6 weeks  Acute blood loss anemia Stable Likely due to above Hemoglobin at baseline 14-15, 10.1 on admission Status post 2 units of packed RBC on 7/4 Anemia panel showed iron of 77, ferritin of 122, vitamin B12 180, folate 11.4  Acute encephalopathy/delirium -Remains confused, with fluctuating mental status,  this is most likely related to hospital delirium, he is  improving, less agitated now, off restraints for last 24 hours, -No evidence of infection - CT head negative for any acute intracranial abnormalities - MRI negative for any acute changes, unchanged from 2017 - EEG consistent with mild generalized nonspecific dysfunction, encephalopathy.  No seizure or seizure predisposition recorded on the study -Patient with low B12 level, is being repleted with p.o., repleting with IM during hospital stay -Psychiatry consulted regarding management, he was started on Zyprexa 10 mg at bedtime, 5 during daytime, I have stopped daytime so he is more awake and interactive during the day so he can eat and sit in the recliner, appears to be improving overall, on PRN Zyprexa .  Hypernatremia -Secondary to decreased fluid intake, intake has improved and fluid intake has improved as well  Multiple sclerosis - Followed by neurology as an outpatient - On home tecfidera, pharmacy does not stock medication, patient has to bring home meds, have called his wife today 11/09/2017, and I have asked her if she can bring it to the hospital and she states she well  Bipolar disorder Unsure if patient is on any meds at home Psychiatry consulted, likely delirium, unlikely manic episode from bipolar, rec med adjustment as mentioned above  Alcohol abuse Last alcoholic drink was over 1 week ago PTA  Protein calorie malnutrition -BMI of 20, with significant muscle wasting, continue with supplements  Code Status: Full  Family Communication: Discussed with ex-wife via phone on 11/09/2017  Disposition Plan: SNF,    Consultants:  GI  IR  Psychiatry  Procedures:  EGD  Embolization by IR  Antimicrobials:  Fosfomycin X 1 dose  DVT prophylaxis: SCDs, not on  chemical prophylaxis given recent deep gastric ulcer with active bleed   Objective: Vitals:   11/08/17 1353 11/08/17 2156 11/09/17 0519 11/09/17 0700  BP: 130/80 (!) 97/56 113/62   Pulse: (!) 102 75 63     Resp:   20   Temp:  97.8 F (36.6 C) 97.9 F (36.6 C)   TempSrc:  Oral Oral   SpO2: 100% 96% 100%   Weight:    65.9 kg (145 lb 4.5 oz)  Height:        Intake/Output Summary (Last 24 hours) at 11/09/2017 1118 Last data filed at 11/09/2017 1000 Gross per 24 hour  Intake 360 ml  Output 350 ml  Net 10 ml   Filed Weights   11/05/17 1100 11/06/17 0607 11/09/17 0700  Weight: 68.4 kg (150 lb 12.7 oz) 67 kg (147 lb 11.3 oz) 65.9 kg (145 lb 4.5 oz)    Exam:  Awake , confused, frail thin appearing male laying in bed in no apparent distress  Symmetrical Chest wall movement, Good air movement bilaterally, CTAB RRR,No Gallops,Rubs or new Murmurs, No Parasternal Heave +ve B.Sounds, Abd Soft, No tenderness, No rebound - guarding or rigidity. No Cyanosis, Clubbing or edema,, significant muscle wasting Data Reviewed: CBC: Recent Labs  Lab 11/06/17 0610 11/07/17 0655  WBC 7.2 6.0  NEUTROABS 5.5  --   HGB 12.2* 11.9*  HCT 38.9* 38.1*  MCV 97.7 96.7  PLT 376 339   Basic Metabolic Panel: Recent Labs  Lab 11/06/17 0610 11/07/17 0655  NA 147* 144  K 4.7 4.8  CL 110 112*  CO2 26 21*  GLUCOSE 118* 114*  BUN 27* 28*  CREATININE 1.10 0.95  CALCIUM 9.5 9.0   GFR: Estimated Creatinine Clearance: 75.1 mL/min (by C-G formula based on SCr of 0.95 mg/dL). Liver Function Tests: No results for input(s): AST, ALT, ALKPHOS, BILITOT, PROT, ALBUMIN in the last 168 hours. No results for input(s): LIPASE, AMYLASE in the last 168 hours. No results for input(s): AMMONIA in the last 168 hours. Coagulation Profile: No results for input(s): INR, PROTIME in the last 168 hours. Cardiac Enzymes: No results for input(s): CKTOTAL, CKMB, CKMBINDEX, TROPONINI in the last 168 hours. BNP (last 3 results) No results for input(s): PROBNP in the last 8760 hours. HbA1C: No results for input(s): HGBA1C in the last 72 hours. CBG: No results for input(s): GLUCAP in the last 168 hours. Lipid Profile: No  results for input(s): CHOL, HDL, LDLCALC, TRIG, CHOLHDL, LDLDIRECT in the last 72 hours. Thyroid Function Tests: No results for input(s): TSH, T4TOTAL, FREET4, T3FREE, THYROIDAB in the last 72 hours. Anemia Panel: No results for input(s): VITAMINB12, FOLATE, FERRITIN, TIBC, IRON, RETICCTPCT in the last 72 hours. Urine analysis:    Component Value Date/Time   COLORURINE YELLOW 11/01/2017 0208   APPEARANCEUR CLEAR 11/01/2017 0208   LABSPEC 1.015 11/01/2017 0208   PHURINE 6.0 11/01/2017 0208   GLUCOSEU NEGATIVE 11/01/2017 0208   HGBUR NEGATIVE 11/01/2017 0208   BILIRUBINUR NEGATIVE 11/01/2017 0208   KETONESUR NEGATIVE 11/01/2017 0208   PROTEINUR NEGATIVE 11/01/2017 0208   UROBILINOGEN 0.2 07/08/2013 0930   NITRITE POSITIVE (A) 11/01/2017 0208   LEUKOCYTESUR NEGATIVE 11/01/2017 0208   Sepsis Labs: @LABRCNTIP (procalcitonin:4,lacticidven:4)  ) Recent Results (from the past 240 hour(s))  Urine Culture     Status: Abnormal   Collection Time: 11/01/17  8:50 AM  Result Value Ref Range Status   Specimen Description   Final    URINE, RANDOM Performed at [facility], [address] [place], [place] [address]    Special Requests   Final    NONE Performed at [facility], [address], [place], [place] [address]    Culture MULTIPLE SPECIES PRESENT, SUGGEST RECOLLECTION (A)  Final   Report Status 11/02/2017 FINAL  Final  Urine Culture     Status: Abnormal   Collection Time: 11/02/17  3:45 PM  Result Value Ref Range Status   Specimen Description   Final    URINE, RANDOM Performed at [facility], [address] [place], [place] [address]    Special Requests   Final    NONE Performed at [facility], [address], [place], [place] [address]    Culture MULTIPLE SPECIES PRESENT, SUGGEST RECOLLECTION (A)  Final   Report Status 11/03/2017 FINAL  Final      Studies: No results found.  Scheduled  Meds: . Dimethyl Fumarate  240 mg Oral BID  . feeding supplement (ENSURE ENLIVE)  237 mL Oral TID BM  . folic acid  1 mg Oral Daily  . guaiFENesin  1,200 mg Oral BID  . mouth rinse  15 mL Mouth Rinse BID  . multivitamin with minerals  1 tablet Oral Daily  . OLANZapine  10 mg Oral QHS  . pantoprazole sodium  40 mg Per Tube BID AC  . polyethylene glycol  17 g Oral Daily  . senna-docusate  1 tablet Oral QHS  . sodium chloride flush  3 mL Intravenous Q12H  . thiamine  100 mg Oral Daily  . vitamin B-12  1,000 mcg Oral Daily    Continuous Infusions:   LOS: 22 days     Huey Bienenstock, MD [facility] Pager [phone]   If 7PM-7AM, please contact night-coverage [email or web address] Password [email or web address] 11/09/2017, 11:18 AM

## 2017-11-10 LAB — GLUCOSE, CAPILLARY: Glucose-Capillary: 139 mg/dL — ABNORMAL HIGH (ref 70–99)

## 2017-11-10 NOTE — Progress Notes (Signed)
PROGRESS NOTE  [name] XBM:[id number] DOB: Sep 26, 1955 DOA: 10/16/2017 PCP: Rinaldo Cloud, MD  HPI/brief narrative 62 year old male with medical history significant for MS, bipolar disorder, alcohol/tobacco abuse, presented to the ER complaining of abdominal pain, nausea/vomiting for the past 2 weeks.  Patient denied any NSAID/aspirin use, no history of GI bleeding in the past.  Patient underwent EGD which revealed a large, deep duodenal bulb ulcer.  Patient underwent empiric embolization by IR on 7/1.  Hospitalization further complicated by acute encephalopathy.  Subjective: Patient  remains confused, but no significant events overnight, he is with improved oral intake, and good liquid intake Assessment/Plan: Principal Problem:   Altered mental status Active Problems:   MS (multiple sclerosis) (HCC)   Bipolar affective disorder, depressed, severe (HCC)   Bipolar I disorder (HCC)   Upper GI bleeding   Symptomatic anemia   GI bleed   Acute encephalopathy   Goals of care, counseling/discussion   Palliative care encounter  Upper GI bleed due to duodenal bulb ulcer Stable Likely due to chronic alcohol abuse Positive FOBT, with hemoglobin drop GI consulted, status post EGD on 6/27 showed very large deep duodenal bulb ulcer, LA grade D esophagitis IR consulted, status post empiric embolization on 10/21/2017 Continue PPI 40 mg twice daily for 8 weeks, then 40 mg daily indefinitely Need repeat EGD in 8 weeks to document healing of esophagitis as well as large duodenal bulb ulcer.  Will follow up with GI in 6 weeks  Acute blood loss anemia Stable Likely due to above Hemoglobin at baseline 14-15, 10.1 on admission Status post 2 units of packed RBC on 7/4 Anemia panel showed iron of 77, ferritin of 122, vitamin B12 180, folate 11.4  Acute encephalopathy/delirium -Remains confused, with fluctuating mental status,  this is most likely related to hospital delirium, he is improving,  less agitated now, off restraints for last 24 hours, -No evidence of infection - CT head negative for any acute intracranial abnormalities - MRI negative for any acute changes, unchanged from 2017 - EEG consistent with mild generalized nonspecific dysfunction, encephalopathy.  No seizure or seizure predisposition recorded on the study -Patient with low B12 level, is being repleted with p.o., repleting with IM during hospital stay -Psychiatry consulted regarding management, he was started on Zyprexa 10 mg at bedtime, 5 during daytime, I have stopped daytime so he is more awake and interactive during the day so he can eat and sit in the recliner, improved oral intake, still requiring some as needed Zyprexa during daytime.  Hypernatremia -Secondary to decreased fluid intake, intake has improved and fluid intake has improved as well  Multiple sclerosis - Followed by neurology as an outpatient - On home tecfidera, pharmacy does not stock medication, patient has to bring home meds, have called his ex wife  11/09/2017, and I have asked her if she can bring it to the hospital and she states she well  Bipolar disorder Unsure if patient is on any meds at home Psychiatry consulted, likely delirium, unlikely manic episode from bipolar, rec med adjustment as mentioned above  Alcohol abuse Last alcoholic drink was over 1 week ago PTA  Protein calorie malnutrition -BMI of 20, with significant muscle wasting, continue with supplements  Code Status: Full  Family Communication: Discussed with ex-wife via phone on 11/09/2017  Disposition Plan: SNF,    Consultants:  GI  IR  Psychiatry  Procedures:  EGD  Embolization by IR  Antimicrobials:  Fosfomycin X 1 dose  DVT prophylaxis: SCDs,  not on chemical prophylaxis given recent deep gastric ulcer with active bleed   Objective: Vitals:   11/09/17 0519 11/09/17 0700 11/09/17 2022 11/10/17 0802  BP: 113/62  106/65   Pulse: 63  66   Resp:  20  18   Temp: 97.9 F (36.6 C)  97.6 F (36.4 C)   TempSrc: Oral  Oral   SpO2: 100%  100%   Weight:  65.9 kg (145 lb 4.5 oz)  66.6 kg (146 lb 13.2 oz)  Height:        Intake/Output Summary (Last 24 hours) at 11/10/2017 1156 Last data filed at 11/10/2017 1027 Gross per 24 hour  Intake 720 ml  Output 650 ml  Net 70 ml   Filed Weights   11/06/17 0607 11/09/17 0700 11/10/17 0802  Weight: 67 kg (147 lb 11.3 oz) 65.9 kg (145 lb 4.5 oz) 66.6 kg (146 lb 13.2 oz)    Exam:  Patient is awake, laying in bed, confused, more communicative today , thin appearing with significant muscle wasting . Symmetrical Chest wall movement, Good air movement bilaterally, CTAB RRR,No Gallops,Rubs or new Murmurs, No Parasternal Heave +ve B.Sounds, Abd Soft, No tenderness, No rebound - guarding or rigidity. No Cyanosis, Clubbing or edema,, significant muscle wasting Data Reviewed: CBC: Recent Labs  Lab 11/06/17 0610 11/07/17 0655  WBC 7.2 6.0  NEUTROABS 5.5  --   HGB 12.2* 11.9*  HCT 38.9* 38.1*  MCV 97.7 96.7  PLT 376 339   Basic Metabolic Panel: Recent Labs  Lab 11/06/17 0610 11/07/17 0655  NA 147* 144  K 4.7 4.8  CL 110 112*  CO2 26 21*  GLUCOSE 118* 114*  BUN 27* 28*  CREATININE 1.10 0.95  CALCIUM 9.5 9.0   GFR: Estimated Creatinine Clearance: 75.9 mL/min (by C-G formula based on SCr of 0.95 mg/dL). Liver Function Tests: No results for input(s): AST, ALT, ALKPHOS, BILITOT, PROT, ALBUMIN in the last 168 hours. No results for input(s): LIPASE, AMYLASE in the last 168 hours. No results for input(s): AMMONIA in the last 168 hours. Coagulation Profile: No results for input(s): INR, PROTIME in the last 168 hours. Cardiac Enzymes: No results for input(s): CKTOTAL, CKMB, CKMBINDEX, TROPONINI in the last 168 hours. BNP (last 3 results) No results for input(s): PROBNP in the last 8760 hours. HbA1C: No results for input(s): HGBA1C in the last 72 hours. CBG: No results for input(s):  GLUCAP in the last 168 hours. Lipid Profile: No results for input(s): CHOL, HDL, LDLCALC, TRIG, CHOLHDL, LDLDIRECT in the last 72 hours. Thyroid Function Tests: No results for input(s): TSH, T4TOTAL, FREET4, T3FREE, THYROIDAB in the last 72 hours. Anemia Panel: No results for input(s): VITAMINB12, FOLATE, FERRITIN, TIBC, IRON, RETICCTPCT in the last 72 hours. Urine analysis:    Component Value Date/Time   COLORURINE YELLOW 11/01/2017 0208   APPEARANCEUR CLEAR 11/01/2017 0208   LABSPEC 1.015 11/01/2017 0208   PHURINE 6.0 11/01/2017 0208   GLUCOSEU NEGATIVE 11/01/2017 0208   HGBUR NEGATIVE 11/01/2017 0208   BILIRUBINUR NEGATIVE 11/01/2017 0208   KETONESUR NEGATIVE 11/01/2017 0208   PROTEINUR NEGATIVE 11/01/2017 0208   UROBILINOGEN 0.2 07/08/2013 0930   NITRITE POSITIVE (A) 11/01/2017 0208   LEUKOCYTESUR NEGATIVE 11/01/2017 0208   Sepsis Labs: @LABRCNTIP (procalcitonin:4,lacticidven:4)  ) Recent Results (from the past 240 hour(s))  Urine Culture     Status: Abnormal   Collection Time: 11/01/17  8:50 AM  Result Value Ref Range Status   Specimen Description   Final    URINE, RANDOM  Performed at [facility], [address] [place], [place] [address]    Special Requests   Final    NONE Performed at [facility], [address], [place], [place] [address]    Culture MULTIPLE SPECIES PRESENT, SUGGEST RECOLLECTION (A)  Final   Report Status 11/02/2017 FINAL  Final  Urine Culture     Status: Abnormal   Collection Time: 11/02/17  3:45 PM  Result Value Ref Range Status   Specimen Description   Final    URINE, RANDOM Performed at [facility], [address], [place], [place] [address]    Special Requests   Final    NONE Performed at [facility], [address], [place], [place] [address]    Culture MULTIPLE SPECIES PRESENT, SUGGEST RECOLLECTION (A)  Final   Report Status 11/03/2017 FINAL  Final       Studies: No results found.  Scheduled Meds: . cyanocobalamin  1,000 mcg Subcutaneous Daily  . Dimethyl Fumarate  240 mg Oral BID  . feeding supplement (ENSURE ENLIVE)  237 mL Oral TID BM  . folic acid  1 mg Oral Daily  . guaiFENesin  1,200 mg Oral BID  . mouth rinse  15 mL Mouth Rinse BID  . multivitamin with minerals  1 tablet Oral Daily  . OLANZapine  10 mg Oral QHS  . pantoprazole sodium  40 mg Per Tube BID AC  . polyethylene glycol  17 g Oral Daily  . senna-docusate  1 tablet Oral QHS  . sodium chloride flush  3 mL Intravenous Q12H  . thiamine  100 mg Oral Daily  . vitamin B-12  1,000 mcg Oral Daily    Continuous Infusions:   LOS: 23 days     Huey Bienenstock, MD [facility] Pager [phone]   If 7PM-7AM, please contact night-coverage [email or web address] Password [facility] 11/10/2017, 11:56 AM

## 2017-11-11 DIAGNOSIS — E870 Hyperosmolality and hypernatremia: Secondary | ICD-10-CM | POA: Diagnosis not present

## 2017-11-11 DIAGNOSIS — K269 Duodenal ulcer, unspecified as acute or chronic, without hemorrhage or perforation: Secondary | ICD-10-CM | POA: Diagnosis not present

## 2017-11-11 DIAGNOSIS — M6281 Muscle weakness (generalized): Secondary | ICD-10-CM | POA: Diagnosis not present

## 2017-11-11 DIAGNOSIS — G9341 Metabolic encephalopathy: Secondary | ICD-10-CM | POA: Diagnosis not present

## 2017-11-11 DIAGNOSIS — R4182 Altered mental status, unspecified: Secondary | ICD-10-CM | POA: Diagnosis not present

## 2017-11-11 DIAGNOSIS — R2681 Unsteadiness on feet: Secondary | ICD-10-CM | POA: Diagnosis not present

## 2017-11-11 DIAGNOSIS — D62 Acute posthemorrhagic anemia: Secondary | ICD-10-CM | POA: Diagnosis not present

## 2017-11-11 DIAGNOSIS — D649 Anemia, unspecified: Secondary | ICD-10-CM | POA: Diagnosis not present

## 2017-11-11 DIAGNOSIS — F314 Bipolar disorder, current episode depressed, severe, without psychotic features: Secondary | ICD-10-CM | POA: Diagnosis not present

## 2017-11-11 DIAGNOSIS — G35 Multiple sclerosis: Secondary | ICD-10-CM | POA: Diagnosis not present

## 2017-11-11 DIAGNOSIS — E46 Unspecified protein-calorie malnutrition: Secondary | ICD-10-CM | POA: Diagnosis not present

## 2017-11-11 DIAGNOSIS — R45851 Suicidal ideations: Secondary | ICD-10-CM | POA: Diagnosis not present

## 2017-11-11 DIAGNOSIS — R55 Syncope and collapse: Secondary | ICD-10-CM | POA: Diagnosis not present

## 2017-11-11 DIAGNOSIS — M2550 Pain in unspecified joint: Secondary | ICD-10-CM | POA: Diagnosis not present

## 2017-11-11 DIAGNOSIS — F1010 Alcohol abuse, uncomplicated: Secondary | ICD-10-CM | POA: Diagnosis not present

## 2017-11-11 DIAGNOSIS — Z7401 Bed confinement status: Secondary | ICD-10-CM | POA: Diagnosis not present

## 2017-11-11 DIAGNOSIS — F332 Major depressive disorder, recurrent severe without psychotic features: Secondary | ICD-10-CM | POA: Diagnosis not present

## 2017-11-11 DIAGNOSIS — F3175 Bipolar disorder, in partial remission, most recent episode depressed: Secondary | ICD-10-CM | POA: Diagnosis not present

## 2017-11-11 DIAGNOSIS — L89620 Pressure ulcer of left heel, unstageable: Secondary | ICD-10-CM | POA: Diagnosis not present

## 2017-11-11 DIAGNOSIS — G9349 Other encephalopathy: Secondary | ICD-10-CM | POA: Diagnosis not present

## 2017-11-11 DIAGNOSIS — F05 Delirium due to known physiological condition: Secondary | ICD-10-CM | POA: Diagnosis not present

## 2017-11-11 DIAGNOSIS — K922 Gastrointestinal hemorrhage, unspecified: Secondary | ICD-10-CM | POA: Diagnosis not present

## 2017-11-11 DIAGNOSIS — R1312 Dysphagia, oropharyngeal phase: Secondary | ICD-10-CM | POA: Diagnosis not present

## 2017-11-11 DIAGNOSIS — F339 Major depressive disorder, recurrent, unspecified: Secondary | ICD-10-CM | POA: Diagnosis not present

## 2017-11-11 DIAGNOSIS — K264 Chronic or unspecified duodenal ulcer with hemorrhage: Secondary | ICD-10-CM | POA: Diagnosis not present

## 2017-11-11 DIAGNOSIS — N179 Acute kidney failure, unspecified: Secondary | ICD-10-CM | POA: Diagnosis not present

## 2017-11-11 MED ORDER — FOLIC ACID 1 MG PO TABS: 1.0000 mg | ORAL_TABLET | Freq: Every day | ORAL | Status: DC

## 2017-11-11 MED ORDER — ENSURE ENLIVE PO LIQD: 237.0000 mL | Freq: Three times a day (TID) | ORAL | 12 refills | Status: DC

## 2017-11-11 MED ORDER — THIAMINE HCL 100 MG PO TABS: 100.0000 mg | ORAL_TABLET | Freq: Every day | ORAL | Status: DC

## 2017-11-11 MED ORDER — PANTOPRAZOLE SODIUM 40 MG PO PACK: 40.0000 mg | PACK | Freq: Two times a day (BID) | ORAL | Status: DC

## 2017-11-11 MED ORDER — OLANZAPINE 10 MG PO TABS: 10.0000 mg | ORAL_TABLET | Freq: Every day | ORAL | Status: DC

## 2017-11-11 MED ORDER — POLYETHYLENE GLYCOL 3350 17 G PO PACK: 17.0000 g | PACK | Freq: Every day | ORAL | 0 refills | Status: DC | PRN

## 2017-11-11 MED ORDER — TRAZODONE HCL 50 MG PO TABS: 25.0000 mg | ORAL_TABLET | Freq: Every evening | ORAL | Status: DC | PRN

## 2017-11-11 MED ORDER — SENNOSIDES-DOCUSATE SODIUM 8.6-50 MG PO TABS: 1.0000 | ORAL_TABLET | Freq: Every day | ORAL | Status: DC

## 2017-11-11 MED ORDER — CYANOCOBALAMIN 1000 MCG PO TABS
1000.0000 ug | ORAL_TABLET | Freq: Every day | ORAL | Status: DC
Start: 2017-11-12 — End: 2018-01-03

## 2017-11-11 MED ORDER — ACETAMINOPHEN 325 MG PO TABS: 650.0000 mg | ORAL_TABLET | Freq: Four times a day (QID) | ORAL | Status: DC | PRN

## 2017-11-11 MED ORDER — ADULT MULTIVITAMIN W/MINERALS CH: 1.0000 | ORAL_TABLET | Freq: Every day | ORAL | Status: DC

## 2017-11-11 NOTE — Discharge Instructions (Signed)
Follow with SNF physician and 3 days  Get CBC, CMP,   by Primary MD next visit.    Activity: As tolerated with Full fall precautions use walker/cane & assistance as needed   Disposition SNF   Diet: Dysphagia 3, thin liquid, need to be on full aspiration precaution, with feeding assistant .  For Heart failure patients - Check your Weight same time everyday, if you gain over 2 pounds, or you develop in leg swelling, experience more shortness of breath or chest pain, call your Primary MD immediately. Follow Cardiac Low Salt Diet and 1.5 lit/day fluid restriction.   On your next visit with your primary care physician please Get Medicines reviewed and adjusted.   Please request your Prim.MD to go over all Hospital Tests and Procedure/Radiological results at the follow up, please get all Hospital records sent to your Prim MD by signing hospital release before you go home.   If you experience worsening of your admission symptoms, develop shortness of breath, life threatening emergency, suicidal or homicidal thoughts you must seek medical attention immediately by calling 911 or calling your MD immediately  if symptoms less severe.  You Must read complete instructions/literature along with all the possible adverse reactions/side effects for all the Medicines you take and that have been prescribed to you. Take any new Medicines after you have completely understood and accpet all the possible adverse reactions/side effects.   Do not drive, operating heavy machinery, perform activities at heights, swimming or participation in water activities or provide baby sitting services if your were admitted for syncope or siezures until you have seen by Primary MD or a Neurologist and advised to do so again.  Do not drive when taking Pain medications.    Do not take more than prescribed Pain, Sleep and Anxiety Medications  Special Instructions: If you have smoked or chewed Tobacco  in the last 2 yrs please  stop smoking, stop any regular Alcohol  and or any Recreational drug use.  Wear Seat belts while driving.   Please note  You were cared for by a hospitalist during your hospital stay. If you have any questions about your discharge medications or the care you received while you were in the hospital after you are discharged, you can call the unit and asked to speak with the hospitalist on call if the hospitalist that took care of you is not available. Once you are discharged, your primary care physician will handle any further medical issues. Please note that NO REFILLS for any discharge medications will be authorized once you are discharged, as it is imperative that you return to your primary care physician (or establish a relationship with a primary care physician if you do not have one) for your aftercare needs so that they can reassess your need for medications and monitor your lab values.

## 2017-11-11 NOTE — Discharge Summary (Signed)
[name], is a 62 y.o. male  DOB [birth year]  MRN [id number].  Admission date:  10/16/2017  Admitting Physician  Lenox Ponds, MD  Discharge Date:  11/11/2017   Primary MD  Rinaldo Cloud, MD  Recommendations for primary care physician for things to follow:  -Please check CBC, BMP during next visit -Patient need to follow with GI and 4 weeks as an outpatient for repeat endoscopy   Admission Diagnosis  Upper GI bleeding [K92.2] Near syncope [R55] Symptomatic anemia [D64.9]   Discharge Diagnosis  Upper GI bleeding [K92.2] Near syncope [R55] Symptomatic anemia [D64.9]    Principal Problem:   Altered mental status Active Problems:   MS (multiple sclerosis) (HCC)   Bipolar affective disorder, depressed, severe (HCC)   Bipolar I disorder (HCC)   Upper GI bleeding   Symptomatic anemia   GI bleed   Acute encephalopathy   Goals of care, counseling/discussion   Palliative care encounter      Past Medical History:  Diagnosis Date  . Depression   . Mental disorder   . Movement disorder   . MS (multiple sclerosis) (HCC)   . Multiple sclerosis, relapsing-remitting (HCC)   . Neuromuscular disorder (HCC)   . Vision abnormalities     Past Surgical History:  Procedure Laterality Date  . BIOPSY  10/17/2017   Procedure: BIOPSY;  Surgeon: Kathi Der, MD;  Location: [facility];  Service: Gastroenterology;;  . ESOPHAGOGASTRODUODENOSCOPY (EGD) WITH PROPOFOL N/A 10/17/2017   Procedure: ESOPHAGOGASTRODUODENOSCOPY (EGD) WITH PROPOFOL;  Surgeon: Kathi Der, MD;  Location: [facility];  Service: Gastroenterology;  Laterality: N/A;  . IR ANGIOGRAM SELECTIVE EACH ADDITIONAL VESSEL  10/21/2017  . IR ANGIOGRAM SELECTIVE EACH ADDITIONAL VESSEL  10/21/2017  . IR ANGIOGRAM VISCERAL SELECTIVE  10/21/2017  . IR EMBO ART  VEN HEMORR LYMPH EXTRAV  INC GUIDE ROADMAPPING  10/21/2017  . IR US GUIDE VASC  ACCESS RIGHT  10/21/2017  . TONSILLECTOMY         History of present illness and  Hospital Course:     Kindly see H&P for history of present illness and admission details, please review complete Labs, Consult reports and Test reports for all details in brief  HPI  from the history and physical done on the day of admission 10/16/2017 HPI: [name] is a 62 y.o. male with medical history significant of MS, bipolar disorder, alcohol and tobacco abuse, presented to the emergency department complaining of abdominal pain with nausea and vomiting.  Patient is poor historian.  He reports to 2-3 days of abdominal pain associated with bloody nausea and vomiting.  Patient reported epigastric pain started approximately 2 weeks ago which has been worse with eating.  Denies diarrhea, constipation and fever or chills.  Last alcohol use 3 days ago.  Normally drinks occasionally  but can drink up to 12 pack a day.  Other associated symptoms include weakness/faint.  Denies NSAID/ASA use, no history of GI bleeding in the past.  Patient denies dark stools.  ED Course: Blood  pressure systolic in the 90s, FOBT positive, hemoglobin 10.1 last on record was 15.  Creatinine 1.4, BUN 65     Hospital Course   62 year old male with medical history significant for MS, bipolar disorder, alcohol/tobacco abuse, presented to the ER complaining of abdominal pain, nausea/vomiting for the past 2 weeks.  Patient denied any NSAID/aspirin use, no history of GI bleeding in the past.  Patient underwent EGD which revealed a large, deep duodenal bulb ulcer.  Patient underwent empiric embolization by IR on 7/1.  Hospitalization further complicated by acute encephalopathy.    Upper GI bleed due to duodenal bulb ulcer Stable Likely due to chronic alcohol abuse Positive FOBT, with hemoglobin drop GI consulted, status post EGD on 6/27 showed very large deep duodenal bulb ulcer, LA grade D esophagitis IR consulted, status post empiric  embolization on 10/21/2017 Continue PPI 40 mg twice daily for 8 weeks, then 40 mg daily indefinitely Need repeat EGD in 8 weeks to document healing of esophagitis as well as large duodenal bulb ulcer.  Will follow up with GI in 6 weeks  Acute blood loss anemia Stable Likely due to above Hemoglobin at baseline 14-15, 10.1 on admission Status post 2 units of packed RBC on 7/4 Anemia panel showed iron of 77, ferritin of 122, vitamin B12 180, folate 11.4  Acute encephalopathy/delirium -Remains confused, with fluctuating mental status,  this is most likely related to hospital delirium, he is improving, less agitated now, off restraints for last 24 hours, -No evidence of infection - CT head negative for any acute intracranial abnormalities - MRI negative for any acute changes, unchanged from 2017 - EEG consistent with mild generalized nonspecific dysfunction, encephalopathy.  No seizure or seizure predisposition recorded on the study -Patient with low B12 level, is being repleted with p.o., repleting with IM during hospital stay -Psychiatry consulted regarding management, he was started on Zyprexa 10 mg at bedtime, 5 during daytime, I have stopped daytime so he is more awake and interactive during the day so he can eat and sit in the recliner, improved oral intake, no PRN Zyprexa requirement over last 24 hours  Hypernatremia -Secondary to decreased fluid intake, intake has improved and fluid intake has improved as well  Multiple sclerosis - Followed by neurology as an outpatient - On home tecfidera, pharmacy does not stock medication, patient has to bring home meds, have called his ex wife  11/09/2017, she has brought his home meds, to be discontinued at facility  Bipolar disorder Unsure if patient is on any meds at home Psychiatry consulted, likely delirium, unlikely manic episode from bipolar, rec med adjustment as mentioned above  Alcohol abuse Last alcoholic drink was over 1 week ago  PTA  Protein calorie malnutrition -BMI of 20, with significant muscle wasting, continue with supplements    Discharge Condition:  Stable   Follow UP  Follow-up Information    Brahmbhatt, Parag, MD. Schedule an appointment as soon as possible for a visit in 6 week(s).   Specialty:  Gastroenterology Why:  Follow-up for esophagitis and large duodenal ulcer. Contact information: [address] [place] [address] [phone]             Discharge Instructions  and  Discharge Medications    Discharge Instructions    Increase activity slowly   Complete by:  As directed    Follow with SNF physician and 3 days  Get CBC, CMP,   by Primary MD next visit.    Activity: As  tolerated with Full fall precautions use walker/cane & assistance as needed   Disposition SNF   Diet: Dysphagia 3, thin liquid, need to be on full aspiration precaution, with feeding assistant .  For Heart failure patients - Check your Weight same time everyday, if you gain over 2 pounds, or you develop in leg swelling, experience more shortness of breath or chest pain, call your Primary MD immediately. Follow Cardiac Low Salt Diet and 1.5 lit/day fluid restriction.   On your next visit with your primary care physician please Get Medicines reviewed and adjusted.   Please request your Prim.MD to go over all Hospital Tests and Procedure/Radiological results at the follow up, please get all Hospital records sent to your Prim MD by signing hospital release before you go home.   If you experience worsening of your admission symptoms, develop shortness of breath, life threatening emergency, suicidal or homicidal thoughts you must seek medical attention immediately by calling 911 or calling your MD immediately  if symptoms less severe.  You Must read complete instructions/literature along with all the possible adverse reactions/side effects for all the Medicines you take and that have been  prescribed to you. Take any new Medicines after you have completely understood and accpet all the possible adverse reactions/side effects.   Do not drive, operating heavy machinery, perform activities at heights, swimming or participation in water activities or provide baby sitting services if your were admitted for syncope or siezures until you have seen by Primary MD or a Neurologist and advised to do so again.  Do not drive when taking Pain medications.    Do not take more than prescribed Pain, Sleep and Anxiety Medications  Special Instructions: If you have smoked or chewed Tobacco  in the last 2 yrs please stop smoking, stop any regular Alcohol  and or any Recreational drug use.  Wear Seat belts while driving.   Please note  You were cared for by a hospitalist during your hospital stay. If you have any questions about your discharge medications or the care you received while you were in the hospital after you are discharged, you can call the unit and asked to speak with the hospitalist on call if the hospitalist that took care of you is not available. Once you are discharged, your primary care physician will handle any further medical issues. Please note that NO REFILLS for any discharge medications will be authorized once you are discharged, as it is imperative that you return to your primary care physician (or establish a relationship with a primary care physician if you do not have one) for your aftercare needs so that they can reassess your need for medications and monitor your lab values.     Allergies as of 11/11/2017   No Known Allergies     Medication List    STOP taking these medications   ARIPiprazole 5 MG tablet Commonly known as:  ABILIFY   buPROPion 300 MG 24 hr tablet Commonly known as:  WELLBUTRIN XL   escitalopram 20 MG tablet Commonly known as:  LEXAPRO   lithium carbonate 450 MG CR tablet Commonly known as:  ESKALITH     TAKE these medications     acetaminophen 325 MG tablet Commonly known as:  TYLENOL Take 2 tablets (650 mg total) by mouth every 6 (six) hours as needed for mild pain (or Fever >/= 101).   cyanocobalamin 1000 MCG tablet Take 1 tablet (1,000 mcg total) by mouth daily. Start taking on:  11/12/2017  Dimethyl Fumarate 240 MG Cpdr Commonly known as:  TECFIDERA Take 1 capsule (240 mg total) by mouth 2 (two) times daily.   feeding supplement (ENSURE ENLIVE) Liqd Take 237 mLs by mouth 3 (three) times daily between meals.   folic acid 1 MG tablet Commonly known as:  FOLVITE Take 1 tablet (1 mg total) by mouth daily. Start taking on:  11/12/2017   multivitamin with minerals Tabs tablet Take 1 tablet by mouth daily. Start taking on:  11/12/2017   OLANZapine 10 MG tablet Commonly known as:  ZYPREXA Take 1 tablet (10 mg total) by mouth at bedtime.   pantoprazole sodium 40 mg/20 mL Pack Commonly known as:  PROTONIX Place 20 mLs (40 mg total) into feeding tube 2 (two) times daily before a meal.   polyethylene glycol packet Commonly known as:  MIRALAX / GLYCOLAX Take 17 g by mouth daily as needed for moderate constipation.   senna-docusate 8.6-50 MG tablet Commonly known as:  Senokot-S Take 1 tablet by mouth at bedtime.   thiamine 100 MG tablet Take 1 tablet (100 mg total) by mouth daily. Start taking on:  11/12/2017   traZODone 50 MG tablet Commonly known as:  DESYREL Take 0.5 tablets (25 mg total) by mouth at bedtime as needed for sleep.         Diet and Activity recommendation: See Discharge Instructions above   Consults obtained -   GI  IR  Psychiatry      Major procedures and Radiology Reports - PLEASE review detailed and final reports for all details, in brief -    EGD  Embolization by IR      Dg Abd 1 View  Result Date: 10/23/2017 CLINICAL DATA:  NG tube placement. EXAM: ABDOMEN - 1 VIEW COMPARISON:  10/23/2017 FINDINGS: An NG tube is identified with tip overlying the  gastric fundus. Nondistended gas-filled small bowel loops are noted, decreased. No other significant change IMPRESSION: NG tube with tip overlying gastric fundus. Electronically Signed   By: Harmon Pier M.D.   On: 10/23/2017 20:29   Dg Abd 1 View  Result Date: 10/23/2017 CLINICAL DATA:  Nasogastric tube placement EXAM: ABDOMEN - 1 VIEW COMPARISON:  Portable exam 1707 hours compared to 12/09/2006 FINDINGS: Tip of nasogastric tube projects over proximal stomach. Dilated air-filled loops of small bowel in the upper and mid abdomen. Small amount of stool and gas within colon. Multiple coils are present in the mid abdomen projecting over the upper lumbar spine from prior embolization procedure. Bones demineralized. IMPRESSION: Nasogastric tube projects over the proximal stomach. Air-filled small bowel loops throughout upper abdomen with gas and stool in colon, question ileus. Electronically Signed   By: Ulyses Southward M.D.   On: 10/23/2017 17:31   Ct Head Wo Contrast  Result Date: 10/31/2017 CLINICAL DATA:  62 y/o M; increased confusion and agitation. Encephalopathy. History of multiple sclerosis and neuromuscular disorder. EXAM: CT HEAD WITHOUT CONTRAST TECHNIQUE: Contiguous axial images were obtained from the base of the skull through the vertex without intravenous contrast. COMPARISON:  08/11/2015 MRI of the head. FINDINGS: Brain: No evidence of acute infarction, hemorrhage, hydrocephalus, extra-axial collection or mass lesion/mass effect. Stable moderate diffuse white matter changes and small areas of encephalomalacia within the superior frontal lobes bilaterally. Stable moderate brain parenchymal volume loss. Vascular: Calcific atherosclerosis of carotid siphons. No hyperdense vessel. Skull: Normal. Negative for fracture or focal lesion. Sinuses/Orbits: No acute finding. Other: None. IMPRESSION: 1. No acute intracranial abnormality identified. 2. Stable moderate white matter  changes and brain parenchymal volume  loss given differences in technique. Stable small areas of encephalomalacia within the superior frontal gyri. Electronically Signed   By: Mitzi Hansen M.D.   On: 10/31/2017 17:17   Mr Brain Wo Contrast  Result Date: 11/02/2017 CLINICAL DATA:  Encephalopathy. Confusion and agitation. History of multiple sclerosis. EXAM: MRI HEAD WITHOUT CONTRAST TECHNIQUE: Multiplanar, multiecho pulse sequences of the brain and surrounding structures were obtained without intravenous contrast. COMPARISON:  CT 10/31/2017.  MRI 08/11/2015. FINDINGS: Brain: Diffusion imaging does not show any acute or subacute infarction or other cause of restricted diffusion. The brainstem is normal. There are a few old small vessel cerebellar infarctions. Cerebral hemispheres show patchy and confluent abnormal T2 and FLAIR signal affecting the cerebral hemispheric deep and subcortical white matter. This is consistent with chronic small vessel ischemic changes. Certainly, there could be coexistent demyelinating disease. Compared to the study of 2017 the findings appear quite similar. There is an old small cortical and subcortical infarction in the right posterior parietal region. Involvement of the corpus callosum appears similar to the previous exam. Vascular: Major vessels at the base of the brain show flow. Skull and upper cervical spine: Negative Sinuses/Orbits: Clear/normal Other: None IMPRESSION: No acute finding by MRI. No visible change since the study of 2017. Widespread abnormal T2 and FLAIR signal within the cerebral hemispheric deep and subcortical white matter that could be a combination of small-vessel disease and demyelinating disease. None of the abnormalities shows restricted diffusion. 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 Electronically Signed   By: Paulina Fusi M.D.   On: 11/02/2017 13:44   Ir Angiogram Visceral Selective  Result Date: 10/22/2017 INDICATION: 62 year old male with a history  of acute, emergent upper GI bleed, with endoscopy demonstrating duodenal ulcer EXAM: SELECTIVE VISCERAL ARTERIOGRAPHY; IR ULTRASOUND GUIDANCE VASC ACCESS RIGHT; ADDITIONAL ARTERIOGRAPHY; IR EMBO ART VEN HEMORR LYMPH EXTRAV INC GUIDE ROADMAPPING MEDICATIONS: None ANESTHESIA/SEDATION: Moderate (conscious) sedation was employed during this procedure. A total of Versed 2.0 mg and Fentanyl 100 mcg was administered intravenously. Moderate Sedation Time: 40 minutes. The patient's level of consciousness and vital signs were monitored continuously by radiology nursing throughout the procedure under my direct supervision. CONTRAST:  60 cc Isovue FLUOROSCOPY TIME:  Fluoroscopy Time: 9 minutes 42 seconds (346 mGy). COMPLICATIONS: None PROCEDURE: Informed consent was obtained from the patient following explanation of the procedure, risks, benefits and alternatives. The patient understands, agrees and consents for the procedure. All questions were addressed. A time out was performed prior to the initiation of the procedure. Maximal barrier sterile technique utilized including caps, mask, sterile gowns, sterile gloves, large sterile drape, hand hygiene, and Betadine prep. Ultrasound survey of the right inguinal region was performed with images stored and sent to PACs, confirming patency of the vessel. A micropuncture needle was used access the right common femoral artery under ultrasound. With excellent arterial blood flow returned, and an .018 micro wire was passed through the needle, observed enter the abdominal aorta under fluoroscopy. The needle was removed, and a micropuncture sheath was placed over the wire. The inner dilator and wire were removed, and an 035 Bentson wire was advanced under fluoroscopy into the abdominal aorta. The sheath was removed and a standard 5 [place] vascular sheath was placed. The dilator was removed and the sheath was flushed. C2 Cobra catheter was advanced over the Bentson wire to the aorta.  Catheter was use elect the celiac artery. Angiogram was performed. Glidewire was used to navigate the [employer]  catheter into the common hepatic artery. Angiogram was performed. Microcatheter was then used to navigate a microwire into the gastroduodenal artery. Angiogram was performed. Empiric embolization was then performed with a coil mass, coiling the gastroduodenal artery to the origin. Catheters and wires were removed. Exoseal was deployed at the right common femoral artery for hemostasis. Patient tolerated the procedure well and remained hemodynamically stable throughout. No complications were encountered and no significant blood loss. FINDINGS: Ultrasound demonstrates patency the right common femoral artery. Angiogram of the celiac artery demonstrates typical anatomy, with the celiac artery contributing to splenic artery and common hepatic artery. Gastroduodenal artery arises from the common hepatic artery. Angiogram of the gastroduodenal artery demonstrates no tumor blush, extravasation, pseudoaneurysm. Duodenal branches originate from the mid segment of the descending gastroduodenal artery. Gastroepiploic artery is patent. Status post coil embolization there is no flow into the branches of the gastroduodenal artery, with slight filling at the stump and no collateral flow to the mid segment. IMPRESSION: Status post ultrasound guided access right common femoral artery for mesenteric angiogram and empiric embolization of gastroduodenal artery, as a target contributing to emergent, life-threatening upper GI hemorrhage. Status post Exoseal deployment for hemostasis. Signed, Yvone Neu. Reyne Dumas, RPVI [facility] Radiology Electronically Signed   By: Gilmer Mor D.O.   On: 10/22/2017 09:02   Ir Angiogram Selective Each Additional Vessel  Result Date: 10/22/2017 INDICATION: 62 year old male with a history of acute, emergent upper GI bleed, with endoscopy  demonstrating duodenal ulcer EXAM: SELECTIVE VISCERAL ARTERIOGRAPHY; IR ULTRASOUND GUIDANCE VASC ACCESS RIGHT; ADDITIONAL ARTERIOGRAPHY; IR EMBO ART VEN HEMORR LYMPH EXTRAV INC GUIDE ROADMAPPING MEDICATIONS: None ANESTHESIA/SEDATION: Moderate (conscious) sedation was employed during this procedure. A total of Versed 2.0 mg and Fentanyl 100 mcg was administered intravenously. Moderate Sedation Time: 40 minutes. The patient's level of consciousness and vital signs were monitored continuously by radiology nursing throughout the procedure under my direct supervision. CONTRAST:  60 cc Isovue FLUOROSCOPY TIME:  Fluoroscopy Time: 9 minutes 42 seconds (346 mGy). COMPLICATIONS: None PROCEDURE: Informed consent was obtained from the patient following explanation of the procedure, risks, benefits and alternatives. The patient understands, agrees and consents for the procedure. All questions were addressed. A time out was performed prior to the initiation of the procedure. Maximal barrier sterile technique utilized including caps, mask, sterile gowns, sterile gloves, large sterile drape, hand hygiene, and Betadine prep. Ultrasound survey of the right inguinal region was performed with images stored and sent to PACs, confirming patency of the vessel. A micropuncture needle was used access the right common femoral artery under ultrasound. With excellent arterial blood flow returned, and an .018 micro wire was passed through the needle, observed enter the abdominal aorta under fluoroscopy. The needle was removed, and a micropuncture sheath was placed over the wire. The inner dilator and wire were removed, and an 035 Bentson wire was advanced under fluoroscopy into the abdominal aorta. The sheath was removed and a standard 5 [place] vascular sheath was placed. The dilator was removed and the sheath was flushed. C2 Cobra catheter was advanced over the Bentson wire to the aorta. Catheter was use elect the celiac artery. Angiogram was  performed. Glidewire was used to navigate the [place] catheter into the common hepatic artery. Angiogram was performed. Microcatheter was then used to navigate a microwire into the gastroduodenal artery. Angiogram was performed. Empiric embolization was then performed with a coil mass, coiling the gastroduodenal artery to the origin. Catheters and wires were removed. Exoseal  was deployed at the right common femoral artery for hemostasis. Patient tolerated the procedure well and remained hemodynamically stable throughout. No complications were encountered and no significant blood loss. FINDINGS: Ultrasound demonstrates patency the right common femoral artery. Angiogram of the celiac artery demonstrates typical anatomy, with the celiac artery contributing to splenic artery and common hepatic artery. Gastroduodenal artery arises from the common hepatic artery. Angiogram of the gastroduodenal artery demonstrates no tumor blush, extravasation, pseudoaneurysm. Duodenal branches originate from the mid segment of the descending gastroduodenal artery. Gastroepiploic artery is patent. Status post coil embolization there is no flow into the branches of the gastroduodenal artery, with slight filling at the stump and no collateral flow to the mid segment. IMPRESSION: Status post ultrasound guided access right common femoral artery for mesenteric angiogram and empiric embolization of gastroduodenal artery, as a target contributing to emergent, life-threatening upper GI hemorrhage. Status post Exoseal deployment for hemostasis. Signed, Yvone Neu. Reyne Dumas, RPVI [facility] Radiology Electronically Signed   By: Gilmer Mor D.O.   On: 10/22/2017 09:02   Ir Angiogram Selective Each Additional Vessel  Result Date: 10/22/2017 INDICATION: 62 year old male with a history of acute, emergent upper GI bleed, with endoscopy demonstrating duodenal ulcer EXAM: SELECTIVE VISCERAL  ARTERIOGRAPHY; IR ULTRASOUND GUIDANCE VASC ACCESS RIGHT; ADDITIONAL ARTERIOGRAPHY; IR EMBO ART VEN HEMORR LYMPH EXTRAV INC GUIDE ROADMAPPING MEDICATIONS: None ANESTHESIA/SEDATION: Moderate (conscious) sedation was employed during this procedure. A total of Versed 2.0 mg and Fentanyl 100 mcg was administered intravenously. Moderate Sedation Time: 40 minutes. The patient's level of consciousness and vital signs were monitored continuously by radiology nursing throughout the procedure under my direct supervision. CONTRAST:  60 cc Isovue FLUOROSCOPY TIME:  Fluoroscopy Time: 9 minutes 42 seconds (346 mGy). COMPLICATIONS: None PROCEDURE: Informed consent was obtained from the patient following explanation of the procedure, risks, benefits and alternatives. The patient understands, agrees and consents for the procedure. All questions were addressed. A time out was performed prior to the initiation of the procedure. Maximal barrier sterile technique utilized including caps, mask, sterile gowns, sterile gloves, large sterile drape, hand hygiene, and Betadine prep. Ultrasound survey of the right inguinal region was performed with images stored and sent to PACs, confirming patency of the vessel. A micropuncture needle was used access the right common femoral artery under ultrasound. With excellent arterial blood flow returned, and an .018 micro wire was passed through the needle, observed enter the abdominal aorta under fluoroscopy. The needle was removed, and a micropuncture sheath was placed over the wire. The inner dilator and wire were removed, and an 035 Bentson wire was advanced under fluoroscopy into the abdominal aorta. The sheath was removed and a standard 5 [place] vascular sheath was placed. The dilator was removed and the sheath was flushed. C2 Cobra catheter was advanced over the Bentson wire to the aorta. Catheter was use elect the celiac artery. Angiogram was performed. Glidewire was used to navigate the [place]  catheter into the common hepatic artery. Angiogram was performed. Microcatheter was then used to navigate a microwire into the gastroduodenal artery. Angiogram was performed. Empiric embolization was then performed with a coil mass, coiling the gastroduodenal artery to the origin. Catheters and wires were removed. Exoseal was deployed at the right common femoral artery for hemostasis. Patient tolerated the procedure well and remained hemodynamically stable throughout. No complications were encountered and no significant blood loss. FINDINGS: Ultrasound demonstrates patency the right common femoral artery. Angiogram of the celiac artery demonstrates typical anatomy,  with the celiac artery contributing to splenic artery and common hepatic artery. Gastroduodenal artery arises from the common hepatic artery. Angiogram of the gastroduodenal artery demonstrates no tumor blush, extravasation, pseudoaneurysm. Duodenal branches originate from the mid segment of the descending gastroduodenal artery. Gastroepiploic artery is patent. Status post coil embolization there is no flow into the branches of the gastroduodenal artery, with slight filling at the stump and no collateral flow to the mid segment. IMPRESSION: Status post ultrasound guided access right common femoral artery for mesenteric angiogram and empiric embolization of gastroduodenal artery, as a target contributing to emergent, life-threatening upper GI hemorrhage. Status post Exoseal deployment for hemostasis. Signed, Yvone Neu. Reyne Dumas, RPVI Vascular and Interventional Radiology Specialists [facility] Radiology Electronically Signed   By: Gilmer Mor D.O.   On: 10/22/2017 09:02   Ir US Guide Vasc Access Right  Result Date: 10/22/2017 INDICATION: 62 year old male with a history of acute, emergent upper GI bleed, with endoscopy demonstrating duodenal ulcer EXAM: SELECTIVE VISCERAL ARTERIOGRAPHY; IR ULTRASOUND GUIDANCE VASC ACCESS RIGHT; ADDITIONAL  ARTERIOGRAPHY; IR EMBO ART VEN HEMORR LYMPH EXTRAV INC GUIDE ROADMAPPING MEDICATIONS: None ANESTHESIA/SEDATION: Moderate (conscious) sedation was employed during this procedure. A total of Versed 2.0 mg and Fentanyl 100 mcg was administered intravenously. Moderate Sedation Time: 40 minutes. The patient's level of consciousness and vital signs were monitored continuously by radiology nursing throughout the procedure under my direct supervision. CONTRAST:  60 cc Isovue FLUOROSCOPY TIME:  Fluoroscopy Time: 9 minutes 42 seconds (346 mGy). COMPLICATIONS: None PROCEDURE: Informed consent was obtained from the patient following explanation of the procedure, risks, benefits and alternatives. The patient understands, agrees and consents for the procedure. All questions were addressed. A time out was performed prior to the initiation of the procedure. Maximal barrier sterile technique utilized including caps, mask, sterile gowns, sterile gloves, large sterile drape, hand hygiene, and Betadine prep. Ultrasound survey of the right inguinal region was performed with images stored and sent to PACs, confirming patency of the vessel. A micropuncture needle was used access the right common femoral artery under ultrasound. With excellent arterial blood flow returned, and an .018 micro wire was passed through the needle, observed enter the abdominal aorta under fluoroscopy. The needle was removed, and a micropuncture sheath was placed over the wire. The inner dilator and wire were removed, and an 035 Bentson wire was advanced under fluoroscopy into the abdominal aorta. The sheath was removed and a standard 5 [place] vascular sheath was placed. The dilator was removed and the sheath was flushed. C2 Cobra catheter was advanced over the Bentson wire to the aorta. Catheter was use elect the celiac artery. Angiogram was performed. Glidewire was used to navigate the [place] catheter into the common hepatic artery. Angiogram was performed.  Microcatheter was then used to navigate a microwire into the gastroduodenal artery. Angiogram was performed. Empiric embolization was then performed with a coil mass, coiling the gastroduodenal artery to the origin. Catheters and wires were removed. Exoseal was deployed at the right common femoral artery for hemostasis. Patient tolerated the procedure well and remained hemodynamically stable throughout. No complications were encountered and no significant blood loss. FINDINGS: Ultrasound demonstrates patency the right common femoral artery. Angiogram of the celiac artery demonstrates typical anatomy, with the celiac artery contributing to splenic artery and common hepatic artery. Gastroduodenal artery arises from the common hepatic artery. Angiogram of the gastroduodenal artery demonstrates no tumor blush, extravasation, pseudoaneurysm. Duodenal branches originate from the mid segment of the descending gastroduodenal artery. Gastroepiploic artery is  patent. Status post coil embolization there is no flow into the branches of the gastroduodenal artery, with slight filling at the stump and no collateral flow to the mid segment. IMPRESSION: Status post ultrasound guided access right common femoral artery for mesenteric angiogram and empiric embolization of gastroduodenal artery, as a target contributing to emergent, life-threatening upper GI hemorrhage. Status post Exoseal deployment for hemostasis. Signed, Yvone Neu. Reyne Dumas, RPVI Vascular and Interventional Radiology Specialists [facility] Radiology Electronically Signed   By: Gilmer Mor D.O.   On: 10/22/2017 09:02   Dg Chest Port 1 View  Result Date: 11/01/2017 CLINICAL DATA:  63 year old male with encephalopathy EXAM: PORTABLE CHEST 1 VIEW COMPARISON:  Prior chest x-ray 09/24/2016 FINDINGS: The lungs are clear and negative for focal airspace consolidation, pulmonary edema or suspicious pulmonary nodule. No pleural effusion or pneumothorax. Cardiac and  mediastinal contours are within normal limits. No acute fracture or lytic or blastic osseous lesions. The visualized upper abdominal bowel gas pattern is unremarkable. IMPRESSION: No active disease. Electronically Signed   By: Malachy Moan M.D.   On: 11/01/2017 08:38   Ir Embo [place] Hemorr Lymph [facility] [employer] Guide Roadmapping  Result Date: 10/22/2017 INDICATION: 62 year old male with a history of acute, emergent upper GI bleed, with endoscopy demonstrating duodenal ulcer EXAM: SELECTIVE VISCERAL ARTERIOGRAPHY; IR ULTRASOUND GUIDANCE VASC ACCESS RIGHT; ADDITIONAL ARTERIOGRAPHY; IR EMBO ART VEN HEMORR LYMPH EXTRAV INC GUIDE ROADMAPPING MEDICATIONS: None ANESTHESIA/SEDATION: Moderate (conscious) sedation was employed during this procedure. A total of Versed 2.0 mg and Fentanyl 100 mcg was administered intravenously. Moderate Sedation Time: 40 minutes. The patient's level of consciousness and vital signs were monitored continuously by radiology nursing throughout the procedure under my direct supervision. CONTRAST:  60 cc Isovue FLUOROSCOPY TIME:  Fluoroscopy Time: 9 minutes 42 seconds (346 mGy). COMPLICATIONS: None PROCEDURE: Informed consent was obtained from the patient following explanation of the procedure, risks, benefits and alternatives. The patient understands, agrees and consents for the procedure. All questions were addressed. A time out was performed prior to the initiation of the procedure. Maximal barrier sterile technique utilized including caps, mask, sterile gowns, sterile gloves, large sterile drape, hand hygiene, and Betadine prep. Ultrasound survey of the right inguinal region was performed with images stored and sent to PACs, confirming patency of the vessel. A micropuncture needle was used access the right common femoral artery under ultrasound. With excellent arterial blood flow returned, and an .018 micro wire was passed through the needle, observed enter the abdominal aorta under  fluoroscopy. The needle was removed, and a micropuncture sheath was placed over the wire. The inner dilator and wire were removed, and an 035 Bentson wire was advanced under fluoroscopy into the abdominal aorta. The sheath was removed and a standard 5 [place] vascular sheath was placed. The dilator was removed and the sheath was flushed. C2 Cobra catheter was advanced over the Bentson wire to the aorta. Catheter was use elect the celiac artery. Angiogram was performed. Glidewire was used to navigate the [place] catheter into the common hepatic artery. Angiogram was performed. Microcatheter was then used to navigate a microwire into the gastroduodenal artery. Angiogram was performed. Empiric embolization was then performed with a coil mass, coiling the gastroduodenal artery to the origin. Catheters and wires were removed. Exoseal was deployed at the right common femoral artery for hemostasis. Patient tolerated the procedure well and remained hemodynamically stable throughout. No complications were encountered and no significant blood loss. FINDINGS: Ultrasound demonstrates patency the right common femoral  artery. Angiogram of the celiac artery demonstrates typical anatomy, with the celiac artery contributing to splenic artery and common hepatic artery. Gastroduodenal artery arises from the common hepatic artery. Angiogram of the gastroduodenal artery demonstrates no tumor blush, extravasation, pseudoaneurysm. Duodenal branches originate from the mid segment of the descending gastroduodenal artery. Gastroepiploic artery is patent. Status post coil embolization there is no flow into the branches of the gastroduodenal artery, with slight filling at the stump and no collateral flow to the mid segment. IMPRESSION: Status post ultrasound guided access right common femoral artery for mesenteric angiogram and empiric embolization of gastroduodenal artery, as a target contributing to emergent, life-threatening upper GI  hemorrhage. Status post Exoseal deployment for hemostasis. Signed, Yvone Neu. Reyne Dumas, RPVI Vascular and Interventional Radiology Specialists [facility] Radiology Electronically Signed   By: Gilmer Mor D.O.   On: 10/22/2017 09:02    Micro Results    Recent Results (from the past 240 hour(s))  Urine Culture     Status: Abnormal   Collection Time: 11/02/17  3:45 PM  Result Value Ref Range Status   Specimen Description   Final    URINE, RANDOM Performed at [facility], [address] [place], [place] [address]    Special Requests   Final    NONE Performed at [facility], [address] [place], [place] [address]    Culture MULTIPLE SPECIES PRESENT, SUGGEST RECOLLECTION (A)  Final   Report Status 11/03/2017 FINAL  Final       Today   Subjective:   [name] today no significant events overnight.  Objective:   Blood pressure 106/67, pulse 74, temperature 98.3 F (36.8 C), temperature source Oral, resp. rate 18, height 5\' 11"  (1.803 m), weight 66.6 kg (146 lb 13.2 oz), SpO2 98 %.   Intake/Output Summary (Last 24 hours) at 11/11/2017 1138 Last data filed at 11/11/2017 0858 Gross per 24 hour  Intake 480 ml  Output 650 ml  Net -170 ml    Exam Patient is awake, laying in bed, confused,  thin appearing with significant muscle wasting . Symmetrical Chest wall movement, Good air movement bilaterally, CTAB RRR,No Gallops,Rubs or new Murmurs, No Parasternal Heave +ve B.Sounds, Abd Soft, No tenderness, No rebound - guarding or rigidity. No Cyanosis, Clubbing or edema,, significant muscle wasting    Data Review   CBC w Diff:  Lab Results  Component Value Date   WBC 6.0 11/07/2017   HGB 11.9 (L) 11/07/2017   HGB 15.0 04/08/2017   HCT 38.1 (L) 11/07/2017   HCT 44.6 04/08/2017   PLT 339 11/07/2017   PLT 269 04/08/2017   LYMPHOPCT 12 11/06/2017   MONOPCT 9 11/06/2017   EOSPCT 1 11/06/2017   BASOPCT 0 11/06/2017    CMP:   Lab Results  Component Value Date   NA 144 11/07/2017   NA 146 (H) 07/15/2015   K 4.8 11/07/2017   CL 112 (H) 11/07/2017   CO2 21 (L) 11/07/2017   BUN 28 (H) 11/07/2017   BUN 10 07/15/2015   CREATININE 0.95 11/07/2017   PROT 4.7 (L) 10/18/2017   PROT 6.6 07/15/2015   ALBUMIN 2.5 (L) 10/18/2017   ALBUMIN 4.0 07/15/2015   BILITOT 0.5 10/18/2017   BILITOT 0.3 07/15/2015   ALKPHOS 37 (L) 10/18/2017   AST 19 10/18/2017   ALT 12 10/18/2017  .   Total Time in preparing paper work, data evaluation and todays exam - 30 minutes  Huey Bienenstock M.D on 11/11/2017  at 11:38 AM  [facility]   Office  [phone]

## 2017-11-11 NOTE — Care Management Important Message (Signed)
Important Message  Patient Details  Name: ERFAN MAHESHWARI MRN: [id number] Date of Birth: 1956-03-21   Medicare Important Message Given:  Yes    Caren Macadam 11/11/2017, 11:41 AMImportant Message  Patient Details  Name: ADGER OSLUND MRN: [id number] Date of Birth: Aug 08, 1955   Medicare Important Message Given:  Yes    Caren Macadam 11/11/2017, 11:41 AM

## 2017-11-11 NOTE — Clinical Social Work Placement (Addendum)
    1:03 PM Patient and family chose bed at [facility].   LCSW confirmed bed with facilty.  Room 105  LCSW faxed dc docs to facility.   Patient will transport by [facility].   LCSW notified family of transport.   RN report #: [phone] Patient must take at home tecfidera with him to faciliy. Ex-wife brought meds up last night.   BKJ   CLINICAL SOCIAL WORK PLACEMENT  NOTE  Date:  11/11/2017  Patient Details  Name: [name] MRN: [id number] Date of Birth: 21-Jan-1956  Clinical Social Work is seeking post-discharge placement for this patient at the Skilled  Nursing Facility level of care (*CSW will initial, date and re-position this form in  chart as items are completed):  Yes   Patient/family provided with [facility] Clinical Social Work Department's list of facilities offering this level of care within the geographic area requested by the patient (or if unable, by the patient's family).  Yes   Patient/family informed of their freedom to choose among providers that offer the needed level of care, that participate in Medicare, Medicaid or managed care program needed by the patient, have an available bed and are willing to accept the patient.  Yes   Patient/family informed of [facility]'s ownership interest in [facility] and [facility], as well as of the fact that they are under no obligation to receive care at these facilities.  PASRR submitted to EDS on       PASRR number received on 11/01/17     Existing PASRR number confirmed on       FL2 transmitted to all facilities in geographic area requested by pt/family on 11/01/17     FL2 transmitted to all facilities within larger geographic area on       Patient informed that his/her managed care company has contracts with or will negotiate with certain facilities, including the following:        Yes   Patient/family informed of bed offers received.  Patient chooses bed at [facility]      Physician recommends and patient chooses bed at      Patient to be transferred to [facility] on 11/11/17.  Patient to be transferred to facility by EMS     Patient family notified on 11/11/17 of transfer.  Name of family member notified:  [name]     PHYSICIAN       Additional Comment:    _______________________________________________ Coralyn Helling, LCSW 11/11/2017, 1:03 PM

## 2017-11-11 NOTE — Progress Notes (Signed)
Nutrition Follow-up  DOCUMENTATION CODES:   Not applicable  INTERVENTION:    Provide MVI daily  Ensure Enlive po BID, each supplement provides 350 kcal and 20 grams of protein  Magic cup TID with meals, each supplement provides 290 kcal and 9 grams of protein  NUTRITION DIAGNOSIS:   Inadequate oral intake related to lethargy/confusion, acute illness(alcohol withdrawal) as evidenced by meal completion < 25%.  Progressing   GOAL:   Patient will meet greater than or equal to 90% of their needs  Not meeting  MONITOR:   Diet advancement, Weight trends, Labs, Other (Comment)(need for NGT replacement and TF re-start)  REASON FOR ASSESSMENT:   Consult Enteral/tube feeding initiation and management  ASSESSMENT:   62 y.o. male with medical history significant of MS, bipolar disorder, alcohol and tobacco abuse, presented to the emergency department complaining of abdominal pain with nausea and vomiting.  Patient is poor historian.  He reports to 2-3 days of abdominal pain associated with bloody nausea and vomiting.  Patient reported epigastric pain started approximately 2 weeks ago which has been worse with eating.    Pt sleeping upon follow up. He would not wake up to answer RD questions. Spoke with RN who reports pt' s intake had progressed significantly over the last few days. Meal completions charted as 20-80% for his last eight meals. He consumed 80% of his oatmeal, eggs, applesauce, and magic cup this morning. He looks to refuse Ensure off and on. Will continue to encourage intake of meals and supplements.   Weight noted to decrease 4 lb since last RD visit 7/15 (150 lb to 146 lb). Will continue to monitor trends.   To D/C to SNF today.   Medications reviewed and include: Vit B12, folic acid, MVI with minerals, miralax, thiamine Labs reviewed.    Diet Order:   Diet Order           DIET DYS 3 Room service appropriate? Yes; Fluid consistency: Thin  Diet effective now          EDUCATION NEEDS:   No education needs have been identified at this time  Skin:  Skin Assessment: Skin Integrity Issues: Skin Integrity Issues:: Incisions Incisions: R groin (7/1)  Last BM:  11/08/17  Height:   Ht Readings from Last 1 Encounters:  10/17/17 5\' 11"  (1.803 m)    Weight:   Wt Readings from Last 1 Encounters:  11/11/17 146 lb 13.2 oz (66.6 kg)    Ideal Body Weight:  78.18 kg  BMI:  Body mass index is 20.48 kg/m.  Estimated Nutritional Needs:   Kcal:  2000-2240 (25-28 kcal/kg)  Protein:  96-112 grams (1.2-1.4 grams/kg)  Fluid:  >/= 2 L/day   Vanessa Kick RD, LDN Clinical Nutrition Pager # - [phone]

## 2017-11-11 NOTE — Progress Notes (Signed)
Patient has [employer]. Patient will need to bring Tecidera to facility with him. Per [name], patient's ex-wife, med is at hospital.   Coralyn Helling, [facility] CSW [phone]

## 2017-11-11 NOTE — Progress Notes (Signed)
Report called to Charlene at [facility]. 

## 2017-11-14 DIAGNOSIS — F314 Bipolar disorder, current episode depressed, severe, without psychotic features: Secondary | ICD-10-CM | POA: Diagnosis not present

## 2017-11-14 DIAGNOSIS — R45851 Suicidal ideations: Secondary | ICD-10-CM | POA: Diagnosis not present

## 2017-11-14 DIAGNOSIS — G35 Multiple sclerosis: Secondary | ICD-10-CM | POA: Diagnosis not present

## 2017-11-14 DIAGNOSIS — F332 Major depressive disorder, recurrent severe without psychotic features: Secondary | ICD-10-CM | POA: Diagnosis not present

## 2017-11-21 DIAGNOSIS — L89620 Pressure ulcer of left heel, unstageable: Secondary | ICD-10-CM | POA: Diagnosis not present

## 2017-11-22 DIAGNOSIS — R2681 Unsteadiness on feet: Secondary | ICD-10-CM | POA: Diagnosis not present

## 2017-11-22 DIAGNOSIS — G35 Multiple sclerosis: Secondary | ICD-10-CM | POA: Diagnosis not present

## 2017-11-22 DIAGNOSIS — R1312 Dysphagia, oropharyngeal phase: Secondary | ICD-10-CM | POA: Diagnosis not present

## 2017-11-22 DIAGNOSIS — M6281 Muscle weakness (generalized): Secondary | ICD-10-CM | POA: Diagnosis not present

## 2017-11-22 DIAGNOSIS — F314 Bipolar disorder, current episode depressed, severe, without psychotic features: Secondary | ICD-10-CM | POA: Diagnosis not present

## 2017-11-25 DIAGNOSIS — Z9181 History of falling: Secondary | ICD-10-CM | POA: Diagnosis not present

## 2017-11-25 DIAGNOSIS — F1910 Other psychoactive substance abuse, uncomplicated: Secondary | ICD-10-CM | POA: Diagnosis not present

## 2017-11-25 DIAGNOSIS — F17210 Nicotine dependence, cigarettes, uncomplicated: Secondary | ICD-10-CM | POA: Diagnosis not present

## 2017-11-25 DIAGNOSIS — G35 Multiple sclerosis: Secondary | ICD-10-CM | POA: Diagnosis not present

## 2017-11-25 DIAGNOSIS — H532 Diplopia: Secondary | ICD-10-CM | POA: Diagnosis not present

## 2017-11-25 DIAGNOSIS — F314 Bipolar disorder, current episode depressed, severe, without psychotic features: Secondary | ICD-10-CM | POA: Diagnosis not present

## 2017-11-25 DIAGNOSIS — D649 Anemia, unspecified: Secondary | ICD-10-CM | POA: Diagnosis not present

## 2017-11-25 DIAGNOSIS — E46 Unspecified protein-calorie malnutrition: Secondary | ICD-10-CM | POA: Diagnosis not present

## 2017-11-26 DIAGNOSIS — H532 Diplopia: Secondary | ICD-10-CM | POA: Diagnosis not present

## 2017-11-26 DIAGNOSIS — E46 Unspecified protein-calorie malnutrition: Secondary | ICD-10-CM | POA: Diagnosis not present

## 2017-11-26 DIAGNOSIS — F1910 Other psychoactive substance abuse, uncomplicated: Secondary | ICD-10-CM | POA: Diagnosis not present

## 2017-11-26 DIAGNOSIS — Z9181 History of falling: Secondary | ICD-10-CM | POA: Diagnosis not present

## 2017-11-26 DIAGNOSIS — F17210 Nicotine dependence, cigarettes, uncomplicated: Secondary | ICD-10-CM | POA: Diagnosis not present

## 2017-11-26 DIAGNOSIS — F314 Bipolar disorder, current episode depressed, severe, without psychotic features: Secondary | ICD-10-CM | POA: Diagnosis not present

## 2017-11-26 DIAGNOSIS — D649 Anemia, unspecified: Secondary | ICD-10-CM | POA: Diagnosis not present

## 2017-11-26 DIAGNOSIS — G35 Multiple sclerosis: Secondary | ICD-10-CM | POA: Diagnosis not present

## 2017-11-27 DIAGNOSIS — K269 Duodenal ulcer, unspecified as acute or chronic, without hemorrhage or perforation: Secondary | ICD-10-CM | POA: Diagnosis not present

## 2017-11-27 DIAGNOSIS — D649 Anemia, unspecified: Secondary | ICD-10-CM | POA: Diagnosis not present

## 2017-11-27 DIAGNOSIS — E785 Hyperlipidemia, unspecified: Secondary | ICD-10-CM | POA: Diagnosis not present

## 2017-11-27 DIAGNOSIS — G35 Multiple sclerosis: Secondary | ICD-10-CM | POA: Diagnosis not present

## 2017-11-27 DIAGNOSIS — F17290 Nicotine dependence, other tobacco product, uncomplicated: Secondary | ICD-10-CM | POA: Diagnosis not present

## 2017-11-27 DIAGNOSIS — F1010 Alcohol abuse, uncomplicated: Secondary | ICD-10-CM | POA: Diagnosis not present

## 2017-12-02 DIAGNOSIS — Z9181 History of falling: Secondary | ICD-10-CM | POA: Diagnosis not present

## 2017-12-02 DIAGNOSIS — H532 Diplopia: Secondary | ICD-10-CM | POA: Diagnosis not present

## 2017-12-02 DIAGNOSIS — F17210 Nicotine dependence, cigarettes, uncomplicated: Secondary | ICD-10-CM | POA: Diagnosis not present

## 2017-12-02 DIAGNOSIS — D649 Anemia, unspecified: Secondary | ICD-10-CM | POA: Diagnosis not present

## 2017-12-02 DIAGNOSIS — G35 Multiple sclerosis: Secondary | ICD-10-CM | POA: Diagnosis not present

## 2017-12-02 DIAGNOSIS — F1910 Other psychoactive substance abuse, uncomplicated: Secondary | ICD-10-CM | POA: Diagnosis not present

## 2017-12-02 DIAGNOSIS — F314 Bipolar disorder, current episode depressed, severe, without psychotic features: Secondary | ICD-10-CM | POA: Diagnosis not present

## 2017-12-02 DIAGNOSIS — E46 Unspecified protein-calorie malnutrition: Secondary | ICD-10-CM | POA: Diagnosis not present

## 2017-12-03 DIAGNOSIS — Z9181 History of falling: Secondary | ICD-10-CM | POA: Diagnosis not present

## 2017-12-03 DIAGNOSIS — F314 Bipolar disorder, current episode depressed, severe, without psychotic features: Secondary | ICD-10-CM | POA: Diagnosis not present

## 2017-12-03 DIAGNOSIS — E46 Unspecified protein-calorie malnutrition: Secondary | ICD-10-CM | POA: Diagnosis not present

## 2017-12-03 DIAGNOSIS — H532 Diplopia: Secondary | ICD-10-CM | POA: Diagnosis not present

## 2017-12-03 DIAGNOSIS — F17210 Nicotine dependence, cigarettes, uncomplicated: Secondary | ICD-10-CM | POA: Diagnosis not present

## 2017-12-03 DIAGNOSIS — F1910 Other psychoactive substance abuse, uncomplicated: Secondary | ICD-10-CM | POA: Diagnosis not present

## 2017-12-03 DIAGNOSIS — G35 Multiple sclerosis: Secondary | ICD-10-CM | POA: Diagnosis not present

## 2017-12-03 DIAGNOSIS — D649 Anemia, unspecified: Secondary | ICD-10-CM | POA: Diagnosis not present

## 2017-12-04 DIAGNOSIS — F314 Bipolar disorder, current episode depressed, severe, without psychotic features: Secondary | ICD-10-CM | POA: Diagnosis not present

## 2017-12-04 DIAGNOSIS — F17210 Nicotine dependence, cigarettes, uncomplicated: Secondary | ICD-10-CM | POA: Diagnosis not present

## 2017-12-04 DIAGNOSIS — H532 Diplopia: Secondary | ICD-10-CM | POA: Diagnosis not present

## 2017-12-04 DIAGNOSIS — G35 Multiple sclerosis: Secondary | ICD-10-CM | POA: Diagnosis not present

## 2017-12-04 DIAGNOSIS — E46 Unspecified protein-calorie malnutrition: Secondary | ICD-10-CM | POA: Diagnosis not present

## 2017-12-04 DIAGNOSIS — F1910 Other psychoactive substance abuse, uncomplicated: Secondary | ICD-10-CM | POA: Diagnosis not present

## 2017-12-04 DIAGNOSIS — Z9181 History of falling: Secondary | ICD-10-CM | POA: Diagnosis not present

## 2017-12-04 DIAGNOSIS — D649 Anemia, unspecified: Secondary | ICD-10-CM | POA: Diagnosis not present

## 2017-12-06 ENCOUNTER — Ambulatory Visit: Payer: Self-pay | Admitting: *Deleted

## 2017-12-06 DIAGNOSIS — H532 Diplopia: Secondary | ICD-10-CM | POA: Diagnosis not present

## 2017-12-06 DIAGNOSIS — D649 Anemia, unspecified: Secondary | ICD-10-CM | POA: Diagnosis not present

## 2017-12-06 DIAGNOSIS — Z9181 History of falling: Secondary | ICD-10-CM | POA: Diagnosis not present

## 2017-12-06 DIAGNOSIS — G35 Multiple sclerosis: Secondary | ICD-10-CM | POA: Diagnosis not present

## 2017-12-06 DIAGNOSIS — F1910 Other psychoactive substance abuse, uncomplicated: Secondary | ICD-10-CM | POA: Diagnosis not present

## 2017-12-06 DIAGNOSIS — F17210 Nicotine dependence, cigarettes, uncomplicated: Secondary | ICD-10-CM | POA: Diagnosis not present

## 2017-12-06 DIAGNOSIS — E46 Unspecified protein-calorie malnutrition: Secondary | ICD-10-CM | POA: Diagnosis not present

## 2017-12-06 DIAGNOSIS — F314 Bipolar disorder, current episode depressed, severe, without psychotic features: Secondary | ICD-10-CM | POA: Diagnosis not present

## 2017-12-06 NOTE — Telephone Encounter (Signed)
Pt's roommate Viviann Spare calling with pt beside him with complaints of left foot feeling warm to touch, red and swollen. Pt states that right foot is swollen as well but left is more swollen. Pt states that swelling is in bilateral feet and does not go up the leg. Pt denies any pain, SOB, chest pain or other symptoms at this time. Pt states he has experienced current symptoms for about a week and a half since being discharged from the hospital. Pt was seen by home health nurse and told to call and make appt. Pt requesting to be seen tomorrow but no appts available at this time. Pt scheduled for appt on Monday and advised to seek care in the ED if symptoms became worse before seen for appt on Monday. Pt verbalized understanding.  Reason for Disposition . [1] MILD swelling of both ankles (i.e., pedal edema) AND [2] new onset or worsening  Answer Assessment - Initial Assessment Questions 1. ONSET: "When did the swelling start?" (e.g., minutes, hours, days)     About a week in a half 2. LOCATION: "What part of the leg is swollen?"  "Are both legs swollen or just one leg?"     Left more swollen than right 3. SEVERITY: "How bad is the swelling?" (e.g., localized; mild, moderate, severe)  - Localized - small area of swelling localized to one leg  - MILD pedal edema - swelling limited to foot and ankle, pitting edema < 1/4 inch (6 mm) deep, rest and elevation eliminate most or all swelling  - MODERATE edema - swelling of lower leg to knee, pitting edema > 1/4 inch (6 mm) deep, rest and elevation only partially reduce swelling  - SEVERE edema - swelling extends above knee, facial or hand swelling present      Mild, states swelling does not go up his leg 4. REDNESS: "Does the swelling look red or infected?"     Yes red but not the whole foot 5. PAIN: "Is the swelling painful to touch?" If so, ask: "How painful is it?"   (Scale 1-10; mild, moderate or severe)     Denies pain 6. FEVER: "Do you have a fever?" If  so, ask: "What is it, how was it measured, and when did it start?"      No 7. CAUSE: "What do you think is causing the leg swelling?"     No idea 8. MEDICAL HISTORY: "Do you have a history of heart failure, kidney disease, liver failure, or cancer?"     No 9. RECURRENT SYMPTOM: "Have you had leg swelling before?" If so, ask: "When was the last time?" "What happened that time?"     No 10. OTHER SYMPTOMS: "Do you have any other symptoms?" (e.g., chest pain, difficulty breathing)       No  Protocols used: LEG SWELLING AND EDEMA-A-AH

## 2017-12-07 DIAGNOSIS — F17210 Nicotine dependence, cigarettes, uncomplicated: Secondary | ICD-10-CM | POA: Diagnosis not present

## 2017-12-07 DIAGNOSIS — Z9181 History of falling: Secondary | ICD-10-CM | POA: Diagnosis not present

## 2017-12-07 DIAGNOSIS — D649 Anemia, unspecified: Secondary | ICD-10-CM | POA: Diagnosis not present

## 2017-12-07 DIAGNOSIS — H532 Diplopia: Secondary | ICD-10-CM | POA: Diagnosis not present

## 2017-12-07 DIAGNOSIS — E46 Unspecified protein-calorie malnutrition: Secondary | ICD-10-CM | POA: Diagnosis not present

## 2017-12-07 DIAGNOSIS — F314 Bipolar disorder, current episode depressed, severe, without psychotic features: Secondary | ICD-10-CM | POA: Diagnosis not present

## 2017-12-07 DIAGNOSIS — G35 Multiple sclerosis: Secondary | ICD-10-CM | POA: Diagnosis not present

## 2017-12-07 DIAGNOSIS — F1910 Other psychoactive substance abuse, uncomplicated: Secondary | ICD-10-CM | POA: Diagnosis not present

## 2017-12-09 ENCOUNTER — Encounter: Payer: Self-pay | Admitting: Physician Assistant

## 2017-12-09 ENCOUNTER — Other Ambulatory Visit: Payer: Self-pay

## 2017-12-09 ENCOUNTER — Ambulatory Visit (INDEPENDENT_AMBULATORY_CARE_PROVIDER_SITE_OTHER): Payer: Medicare HMO | Admitting: Physician Assistant

## 2017-12-09 VITALS — BP 146/83 | HR 71 | Temp 97.7°F | Ht 70.0 in | Wt 176.8 lb

## 2017-12-09 DIAGNOSIS — M7989 Other specified soft tissue disorders: Secondary | ICD-10-CM | POA: Diagnosis not present

## 2017-12-09 NOTE — Patient Instructions (Addendum)
   Start wearing compression socks and elevate your feet 2-3 times/day for 20 min. This will help reduce your swelling.   You will receive a phone call (in 1-2 weeks) to schedule an appointment with vascular.   Please follow-up with your primary care provider.   Thank you for coming in today. I hope you feel we met your needs.  Feel free to call PCP if you have any questions or further requests.  Please consider signing up for MyChart if you do not already have it, as this is a great way to communicate with me.  Best,  Whitney McVey, PA-C   IF you received an x-ray today, you will receive an invoice from [facility]. Please contact [facility] at [phone] with questions or concerns regarding your invoice.   IF you received labwork today, you will receive an invoice from [facility]. Please contact [facility] at [phone] with questions or concerns regarding your invoice.   Our billing staff will not be able to assist you with questions regarding bills from these companies.  You will be contacted with the lab results as soon as they are available. The fastest way to get your results is to activate your My Chart account. Instructions are located on the last page of this paperwork. If you have not heard from [place] regarding the results in 2 weeks, please contact this office.

## 2017-12-09 NOTE — Progress Notes (Signed)
[name]  MRN: [id number] DOB: Nov 20, 1955  PCP: Rinaldo Cloud, MD  Subjective:  Pt is a 62 year old male who presents to clinic for left foot swelling x 2 weeks. This is my first time seeing this pt. He is here today with his roommate [name]   PCP is his cardiologist (per pt)   Pt was admitted from the ED on 6/26 for an upper GI bleed. He was at a nursing home x 2 weeks after his hospital admission. He has been home for about 3 weeks now (per pt). Home health nurse comes for daily checks. Pt endorsed b/l feet swelling for the past few days and he was advised to call and make appt by the home health nurse.  B/l leg tingling at night.  Denies claudication, cough, shob, chest pain, diaphoresis.  He is a current Every Day Smoker, smokes 1ppd No h/o cancer  [name]  has a past medical history of Depression, Mental disorder, Movement disorder, MS (multiple sclerosis) (HCC), Multiple sclerosis, relapsing-remitting (HCC), Neuromuscular disorder (HCC), and Vision abnormalities.  Review of Systems  Constitutional: Negative for chills and diaphoresis.  Respiratory: Negative for cough, chest tightness, shortness of breath and wheezing.   Cardiovascular: Positive for leg swelling (b/l lower legs). Negative for chest pain and palpitations.  Musculoskeletal: Negative for neck pain.  Neurological: Negative for dizziness, syncope, light-headedness and headaches.  Psychiatric/Behavioral: Negative for sleep disturbance. The patient is not nervous/anxious.     Patient Active Problem List   Diagnosis Date Noted  . Altered mental status   . Acute encephalopathy   . Goals of care, counseling/discussion   . Palliative care encounter   . GI bleed 10/18/2017  . Symptomatic anemia   . Upper GI bleeding 10/16/2017  . Coccygeal pain, acute 04/08/2017  . Elevated TSH 04/06/2016  . Bipolar I disorder (HCC) 04/05/2016  . 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 Numbness 01/16/2016    . Alcohol abuse 07/16/2015  . 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 Other fatigue 06/29/2014  . Gait disturbance 06/29/2014  . Urinary urgency 06/29/2014  . High risk medication use 06/29/2014  . Bipolar affective disorder, depressed, severe (HCC) 09/18/2012    Class: Chronic  . MS (multiple sclerosis) (HCC) 10/31/2011    Class: Chronic  . Dysthymia 05/11/2011    Current Outpatient Medications on File Prior to Visit  Medication Sig Dispense Refill  . acetaminophen (TYLENOL) 325 MG tablet Take 2 tablets (650 mg total) by mouth every 6 (six) hours as needed for mild pain (or Fever >/= 101).    . Dimethyl Fumarate (TECFIDERA) 240 MG CPDR Take 1 capsule (240 mg total) by mouth 2 (two) times daily. 180 capsule 3  . feeding supplement, ENSURE ENLIVE, (ENSURE ENLIVE) LIQD Take 237 mLs by mouth 3 (three) times daily between meals. 237 mL 12  . folic acid (FOLVITE) 1 MG tablet Take 1 tablet (1 mg total) by mouth daily.    . Multiple Vitamin (MULTIVITAMIN WITH MINERALS) TABS tablet Take 1 tablet by mouth daily.    [name] OLANZapine (ZYPREXA) 10 MG tablet Take 1 tablet (10 mg total) by mouth at bedtime.    . pantoprazole (PROTONIX) 40 MG tablet TK 1 T PO BID  0  . pantoprazole sodium (PROTONIX) 40 mg/20 mL PACK Place 20 mLs (40 mg total) into feeding tube 2 (two) times daily before a meal. 30 each   . polyethylene glycol (MIRALAX / GLYCOLAX) packet Take 17 g by  mouth daily as needed for moderate constipation. 14 each 0  . senna-docusate (SENOKOT-S) 8.6-50 MG tablet Take 1 tablet by mouth at bedtime.    . thiamine 100 MG tablet Take 1 tablet (100 mg total) by mouth daily.    . traZODone (DESYREL) 50 MG tablet Take 0.5 tablets (25 mg total) by mouth at bedtime as needed for sleep.    . vitamin B-12 1000 MCG tablet Take 1 tablet (1,000 mcg total) by mouth daily.     No current facility-administered medications on file prior to visit.    No Known Allergies   Objective:  BP (!) 146/83 (BP  Location: Left Arm, Patient Position: Sitting, Cuff Size: Normal)   Pulse 71   Temp 97.7 F (36.5 C)   Ht 5\' 10"  (1.778 m)   Wt 176 lb 12.8 oz (80.2 kg)   SpO2 100%   BMI 25.37 kg/m   Physical Exam  Constitutional: He is oriented to person, place, and time. He appears well-developed and well-nourished.  Cardiovascular: Normal rate, regular rhythm and intact distal pulses.  Musculoskeletal:       Right lower leg: He exhibits edema (1+). He exhibits no tenderness, no bony tenderness and no swelling.       Left lower leg: He exhibits edema (1+). He exhibits no tenderness, no bony tenderness and no swelling.  1.5 cm circumferential discrepancy between R and L LES, L>R. Mild violaceous color b/l feet. No posterior calf pain b/l.   Neurological: He is alert and oriented to person, place, and time.  Skin: Skin is warm and dry.  Ulceration posterior left calcaneus - healing well.  No erythema, drainage, warmth, swelling or induration.   Psychiatric: He has a normal mood and affect. His behavior is normal. Judgment and thought content normal.  Vitals reviewed.  Assessment and Plan :  1. Swelling of left lower extremity - pt presents c/o several days LES swelling, R>L. PE is mildly concerning for DVT, however given his recent h/o hospital admission and extensive PMH, plan to get STAT US to r/o DVT. He understands and agrees with plan.  Otherwise, advised elevation and compression. Plan to refer to vascular for evaluation.  - VAS [place] LOWER EXTREMITY VENOUS (DVT); Future - Ambulatory referral to Vascular Surgery - US Venous Img Lower Unilateral Left; Future  Contact: Retia Passe [phone]   Marco Collie, PA-C  Primary Care at [facility] 12/09/2017 12:18 PM  Please note: Portions of this report may have been transcribed using dragon voice recognition software. Every effort was made to ensure accuracy; however, inadvertent computerized transcription errors may be  present.

## 2017-12-10 ENCOUNTER — Ambulatory Visit (HOSPITAL_COMMUNITY)
Admission: RE | Admit: 2017-12-10 | Discharge: 2017-12-10 | Disposition: A | Discharge status: Home / Self Care | Payer: Medicare HMO | Source: Ambulatory Visit | Attending: Vascular Surgery | Admitting: Vascular Surgery

## 2017-12-10 DIAGNOSIS — E46 Unspecified protein-calorie malnutrition: Secondary | ICD-10-CM | POA: Diagnosis not present

## 2017-12-10 DIAGNOSIS — G35 Multiple sclerosis: Secondary | ICD-10-CM | POA: Diagnosis not present

## 2017-12-10 DIAGNOSIS — F314 Bipolar disorder, current episode depressed, severe, without psychotic features: Secondary | ICD-10-CM | POA: Diagnosis not present

## 2017-12-10 DIAGNOSIS — Z9181 History of falling: Secondary | ICD-10-CM | POA: Diagnosis not present

## 2017-12-10 DIAGNOSIS — H532 Diplopia: Secondary | ICD-10-CM | POA: Diagnosis not present

## 2017-12-10 DIAGNOSIS — M7989 Other specified soft tissue disorders: Secondary | ICD-10-CM | POA: Insufficient documentation

## 2017-12-10 DIAGNOSIS — D649 Anemia, unspecified: Secondary | ICD-10-CM | POA: Diagnosis not present

## 2017-12-10 DIAGNOSIS — F1910 Other psychoactive substance abuse, uncomplicated: Secondary | ICD-10-CM | POA: Diagnosis not present

## 2017-12-10 DIAGNOSIS — F17210 Nicotine dependence, cigarettes, uncomplicated: Secondary | ICD-10-CM | POA: Diagnosis not present

## 2017-12-11 DIAGNOSIS — H532 Diplopia: Secondary | ICD-10-CM | POA: Diagnosis not present

## 2017-12-11 DIAGNOSIS — G35 Multiple sclerosis: Secondary | ICD-10-CM | POA: Diagnosis not present

## 2017-12-11 DIAGNOSIS — F314 Bipolar disorder, current episode depressed, severe, without psychotic features: Secondary | ICD-10-CM | POA: Diagnosis not present

## 2017-12-11 DIAGNOSIS — E46 Unspecified protein-calorie malnutrition: Secondary | ICD-10-CM | POA: Diagnosis not present

## 2017-12-11 DIAGNOSIS — D649 Anemia, unspecified: Secondary | ICD-10-CM | POA: Diagnosis not present

## 2017-12-11 DIAGNOSIS — F17210 Nicotine dependence, cigarettes, uncomplicated: Secondary | ICD-10-CM | POA: Diagnosis not present

## 2017-12-11 DIAGNOSIS — Z9181 History of falling: Secondary | ICD-10-CM | POA: Diagnosis not present

## 2017-12-11 DIAGNOSIS — F1910 Other psychoactive substance abuse, uncomplicated: Secondary | ICD-10-CM | POA: Diagnosis not present

## 2017-12-12 ENCOUNTER — Encounter: Payer: Self-pay | Admitting: *Deleted

## 2017-12-12 ENCOUNTER — Other Ambulatory Visit: Payer: Self-pay | Admitting: *Deleted

## 2017-12-12 DIAGNOSIS — D649 Anemia, unspecified: Secondary | ICD-10-CM | POA: Diagnosis not present

## 2017-12-12 DIAGNOSIS — G35 Multiple sclerosis: Secondary | ICD-10-CM | POA: Diagnosis not present

## 2017-12-12 DIAGNOSIS — F17210 Nicotine dependence, cigarettes, uncomplicated: Secondary | ICD-10-CM | POA: Diagnosis not present

## 2017-12-12 DIAGNOSIS — F314 Bipolar disorder, current episode depressed, severe, without psychotic features: Secondary | ICD-10-CM | POA: Diagnosis not present

## 2017-12-12 DIAGNOSIS — H532 Diplopia: Secondary | ICD-10-CM | POA: Diagnosis not present

## 2017-12-12 DIAGNOSIS — Z9181 History of falling: Secondary | ICD-10-CM | POA: Diagnosis not present

## 2017-12-12 DIAGNOSIS — E46 Unspecified protein-calorie malnutrition: Secondary | ICD-10-CM | POA: Diagnosis not present

## 2017-12-12 DIAGNOSIS — F1910 Other psychoactive substance abuse, uncomplicated: Secondary | ICD-10-CM | POA: Diagnosis not present

## 2017-12-12 NOTE — Patient Outreach (Signed)
[facility]) Care Management  12/12/2017  LIN GLAZIER 07-05-1955 [id number]  Referral via [employer]; member discharged from [facility] 11/24/2017:  Telephone call #1 to patient; left HIPPA compliant voice mail requesting return call.  Plan: Send outreach letter to patient. Follow up 2-4 business days.  Colleen Can, RN BSN CCM Care Management Coordinator [facility] Care Management  [phone]

## 2017-12-17 DIAGNOSIS — F17210 Nicotine dependence, cigarettes, uncomplicated: Secondary | ICD-10-CM | POA: Diagnosis not present

## 2017-12-17 DIAGNOSIS — D649 Anemia, unspecified: Secondary | ICD-10-CM | POA: Diagnosis not present

## 2017-12-17 DIAGNOSIS — Z9181 History of falling: Secondary | ICD-10-CM | POA: Diagnosis not present

## 2017-12-17 DIAGNOSIS — G35 Multiple sclerosis: Secondary | ICD-10-CM | POA: Diagnosis not present

## 2017-12-17 DIAGNOSIS — F1910 Other psychoactive substance abuse, uncomplicated: Secondary | ICD-10-CM | POA: Diagnosis not present

## 2017-12-17 DIAGNOSIS — E46 Unspecified protein-calorie malnutrition: Secondary | ICD-10-CM | POA: Diagnosis not present

## 2017-12-17 DIAGNOSIS — H532 Diplopia: Secondary | ICD-10-CM | POA: Diagnosis not present

## 2017-12-17 DIAGNOSIS — F314 Bipolar disorder, current episode depressed, severe, without psychotic features: Secondary | ICD-10-CM | POA: Diagnosis not present

## 2017-12-18 ENCOUNTER — Other Ambulatory Visit: Payer: Self-pay | Admitting: *Deleted

## 2017-12-18 DIAGNOSIS — F1910 Other psychoactive substance abuse, uncomplicated: Secondary | ICD-10-CM | POA: Diagnosis not present

## 2017-12-18 DIAGNOSIS — D649 Anemia, unspecified: Secondary | ICD-10-CM | POA: Diagnosis not present

## 2017-12-18 DIAGNOSIS — F17210 Nicotine dependence, cigarettes, uncomplicated: Secondary | ICD-10-CM | POA: Diagnosis not present

## 2017-12-18 DIAGNOSIS — E46 Unspecified protein-calorie malnutrition: Secondary | ICD-10-CM | POA: Diagnosis not present

## 2017-12-18 DIAGNOSIS — F314 Bipolar disorder, current episode depressed, severe, without psychotic features: Secondary | ICD-10-CM | POA: Diagnosis not present

## 2017-12-18 DIAGNOSIS — G35 Multiple sclerosis: Secondary | ICD-10-CM | POA: Diagnosis not present

## 2017-12-18 DIAGNOSIS — H532 Diplopia: Secondary | ICD-10-CM | POA: Diagnosis not present

## 2017-12-18 DIAGNOSIS — Z9181 History of falling: Secondary | ICD-10-CM | POA: Diagnosis not present

## 2017-12-18 NOTE — Patient Outreach (Signed)
[facility]) Care Management  12/18/2017  [name] [birth year] [id number]  Referral via [employer]; member discharged from [facility] 11/24/2017:  Telephone call attempt x 2; left  message on home and alternate number to return call.  Plan: Follow up 2-4 business days. Outreach letter was sent 12/12/2017.  Colleen Can, RN BSN CCM Care Management Coordinator [facility] Care Management  [phone]

## 2017-12-19 DIAGNOSIS — F17210 Nicotine dependence, cigarettes, uncomplicated: Secondary | ICD-10-CM | POA: Diagnosis not present

## 2017-12-19 DIAGNOSIS — F1910 Other psychoactive substance abuse, uncomplicated: Secondary | ICD-10-CM | POA: Diagnosis not present

## 2017-12-19 DIAGNOSIS — H532 Diplopia: Secondary | ICD-10-CM | POA: Diagnosis not present

## 2017-12-19 DIAGNOSIS — G35 Multiple sclerosis: Secondary | ICD-10-CM | POA: Diagnosis not present

## 2017-12-19 DIAGNOSIS — D649 Anemia, unspecified: Secondary | ICD-10-CM | POA: Diagnosis not present

## 2017-12-19 DIAGNOSIS — Z9181 History of falling: Secondary | ICD-10-CM | POA: Diagnosis not present

## 2017-12-19 DIAGNOSIS — F314 Bipolar disorder, current episode depressed, severe, without psychotic features: Secondary | ICD-10-CM | POA: Diagnosis not present

## 2017-12-19 DIAGNOSIS — E46 Unspecified protein-calorie malnutrition: Secondary | ICD-10-CM | POA: Diagnosis not present

## 2017-12-24 ENCOUNTER — Other Ambulatory Visit: Payer: Self-pay

## 2017-12-24 NOTE — Patient Outreach (Signed)
[facility]) Care Management  12/24/2017  [name] 08/22/1955 [id number]     Transition of Care Referral  Referral Date: 11/27/17 Referral Source: [facility] Discharge Report Date of Admission: unknown Diagnosis: unknown Date of Discharge: 11/24/17 Facility: [facility] Insurance: [facility] Medicare  [facility] referral transferred from L.Manning,RN to RN CM on 12/24/17.    Outreach attempt # 3 to patient. No answer at present and unable to leave message.     Plan: RN CM will close case if no response from letter mailed to patient by previous RN CM.   Antionette Fairy, RN,BSN,CCM [facility] Care Management Telephonic Care Management Coordinator Direct Phone: [phone] Toll Free: [phone] Fax: [phone]

## 2017-12-25 DIAGNOSIS — F17210 Nicotine dependence, cigarettes, uncomplicated: Secondary | ICD-10-CM | POA: Diagnosis not present

## 2017-12-25 DIAGNOSIS — G35 Multiple sclerosis: Secondary | ICD-10-CM | POA: Diagnosis not present

## 2017-12-25 DIAGNOSIS — F1910 Other psychoactive substance abuse, uncomplicated: Secondary | ICD-10-CM | POA: Diagnosis not present

## 2017-12-25 DIAGNOSIS — D649 Anemia, unspecified: Secondary | ICD-10-CM | POA: Diagnosis not present

## 2017-12-25 DIAGNOSIS — F314 Bipolar disorder, current episode depressed, severe, without psychotic features: Secondary | ICD-10-CM | POA: Diagnosis not present

## 2017-12-25 DIAGNOSIS — H532 Diplopia: Secondary | ICD-10-CM | POA: Diagnosis not present

## 2017-12-25 DIAGNOSIS — Z9181 History of falling: Secondary | ICD-10-CM | POA: Diagnosis not present

## 2017-12-25 DIAGNOSIS — E46 Unspecified protein-calorie malnutrition: Secondary | ICD-10-CM | POA: Diagnosis not present

## 2017-12-26 ENCOUNTER — Other Ambulatory Visit: Payer: Self-pay

## 2017-12-26 DIAGNOSIS — H532 Diplopia: Secondary | ICD-10-CM | POA: Diagnosis not present

## 2017-12-26 DIAGNOSIS — E46 Unspecified protein-calorie malnutrition: Secondary | ICD-10-CM | POA: Diagnosis not present

## 2017-12-26 DIAGNOSIS — F17210 Nicotine dependence, cigarettes, uncomplicated: Secondary | ICD-10-CM | POA: Diagnosis not present

## 2017-12-26 DIAGNOSIS — F1910 Other psychoactive substance abuse, uncomplicated: Secondary | ICD-10-CM | POA: Diagnosis not present

## 2017-12-26 DIAGNOSIS — F314 Bipolar disorder, current episode depressed, severe, without psychotic features: Secondary | ICD-10-CM | POA: Diagnosis not present

## 2017-12-26 DIAGNOSIS — Z9181 History of falling: Secondary | ICD-10-CM | POA: Diagnosis not present

## 2017-12-26 DIAGNOSIS — G35 Multiple sclerosis: Secondary | ICD-10-CM | POA: Diagnosis not present

## 2017-12-26 DIAGNOSIS — D649 Anemia, unspecified: Secondary | ICD-10-CM | POA: Diagnosis not present

## 2017-12-26 NOTE — Patient Outreach (Signed)
[facility]) Care Management  12/26/2017  [name] 1956-01-15 [id number]   Transition of Care Referral  Referral Date: 11/27/17 Referral Source: [facility] Discharge Report Date of Admission: unknown Diagnosis: unknown Date of Discharge: 11/24/17 Facility: [facility] Insurance: [facility] Medicare  [facility] referral transferred from L.Manning,RN to RN CM on 12/24/17.     Multiple attempts to establish contact with patient without success. No response from letter mailed to patient. Case is being closed at this time.     Plan: RN CM will close case at this time.   Antionette Fairy, RN,BSN,CCM [facility] Care Management Telephonic Care Management Coordinator Direct Phone: [phone] Toll Free: [phone] Fax: [phone]

## 2017-12-31 DIAGNOSIS — H532 Diplopia: Secondary | ICD-10-CM | POA: Diagnosis not present

## 2017-12-31 DIAGNOSIS — D649 Anemia, unspecified: Secondary | ICD-10-CM | POA: Diagnosis not present

## 2017-12-31 DIAGNOSIS — E46 Unspecified protein-calorie malnutrition: Secondary | ICD-10-CM | POA: Diagnosis not present

## 2017-12-31 DIAGNOSIS — F17210 Nicotine dependence, cigarettes, uncomplicated: Secondary | ICD-10-CM | POA: Diagnosis not present

## 2017-12-31 DIAGNOSIS — F1910 Other psychoactive substance abuse, uncomplicated: Secondary | ICD-10-CM | POA: Diagnosis not present

## 2017-12-31 DIAGNOSIS — G35 Multiple sclerosis: Secondary | ICD-10-CM | POA: Diagnosis not present

## 2017-12-31 DIAGNOSIS — F314 Bipolar disorder, current episode depressed, severe, without psychotic features: Secondary | ICD-10-CM | POA: Diagnosis not present

## 2017-12-31 DIAGNOSIS — Z9181 History of falling: Secondary | ICD-10-CM | POA: Diagnosis not present

## 2018-01-02 ENCOUNTER — Other Ambulatory Visit: Payer: Self-pay

## 2018-01-02 ENCOUNTER — Emergency Department (HOSPITAL_COMMUNITY)
Admission: EM | Admit: 2018-01-02 | Discharge: 2018-01-03 | Disposition: A | Discharge status: Home / Self Care | Payer: Medicare HMO | Attending: Emergency Medicine | Admitting: Emergency Medicine

## 2018-01-02 ENCOUNTER — Encounter (HOSPITAL_COMMUNITY): Payer: Self-pay | Admitting: Emergency Medicine

## 2018-01-02 DIAGNOSIS — R45851 Suicidal ideations: Secondary | ICD-10-CM | POA: Insufficient documentation

## 2018-01-02 DIAGNOSIS — F17210 Nicotine dependence, cigarettes, uncomplicated: Secondary | ICD-10-CM | POA: Insufficient documentation

## 2018-01-02 DIAGNOSIS — F1094 Alcohol use, unspecified with alcohol-induced mood disorder: Secondary | ICD-10-CM | POA: Diagnosis present

## 2018-01-02 DIAGNOSIS — Y902 Blood alcohol level of 40-59 mg/100 ml: Secondary | ICD-10-CM | POA: Diagnosis not present

## 2018-01-02 DIAGNOSIS — Z79899 Other long term (current) drug therapy: Secondary | ICD-10-CM | POA: Diagnosis not present

## 2018-01-02 DIAGNOSIS — Z9181 History of falling: Secondary | ICD-10-CM | POA: Diagnosis not present

## 2018-01-02 DIAGNOSIS — F1290 Cannabis use, unspecified, uncomplicated: Secondary | ICD-10-CM | POA: Insufficient documentation

## 2018-01-02 DIAGNOSIS — E46 Unspecified protein-calorie malnutrition: Secondary | ICD-10-CM | POA: Diagnosis not present

## 2018-01-02 DIAGNOSIS — F319 Bipolar disorder, unspecified: Secondary | ICD-10-CM | POA: Diagnosis not present

## 2018-01-02 DIAGNOSIS — F10920 Alcohol use, unspecified with intoxication, uncomplicated: Secondary | ICD-10-CM | POA: Diagnosis not present

## 2018-01-02 DIAGNOSIS — F329 Major depressive disorder, single episode, unspecified: Secondary | ICD-10-CM | POA: Diagnosis not present

## 2018-01-02 DIAGNOSIS — F29 Unspecified psychosis not due to a substance or known physiological condition: Secondary | ICD-10-CM | POA: Diagnosis not present

## 2018-01-02 DIAGNOSIS — F1910 Other psychoactive substance abuse, uncomplicated: Secondary | ICD-10-CM | POA: Diagnosis not present

## 2018-01-02 DIAGNOSIS — F314 Bipolar disorder, current episode depressed, severe, without psychotic features: Secondary | ICD-10-CM | POA: Diagnosis not present

## 2018-01-02 DIAGNOSIS — F341 Dysthymic disorder: Secondary | ICD-10-CM | POA: Diagnosis present

## 2018-01-02 DIAGNOSIS — G35 Multiple sclerosis: Secondary | ICD-10-CM | POA: Insufficient documentation

## 2018-01-02 DIAGNOSIS — H532 Diplopia: Secondary | ICD-10-CM | POA: Diagnosis not present

## 2018-01-02 DIAGNOSIS — D649 Anemia, unspecified: Secondary | ICD-10-CM | POA: Diagnosis not present

## 2018-01-02 DIAGNOSIS — F1010 Alcohol abuse, uncomplicated: Secondary | ICD-10-CM | POA: Diagnosis present

## 2018-01-02 DIAGNOSIS — Z046 Encounter for general psychiatric examination, requested by authority: Secondary | ICD-10-CM | POA: Diagnosis not present

## 2018-01-02 LAB — COMPREHENSIVE METABOLIC PANEL
ALT: 13 U/L (ref 0–44)
AST: 19 U/L (ref 15–41)
Albumin: 4 g/dL (ref 3.5–5.0)
Alkaline Phosphatase: 51 U/L (ref 38–126)
Anion gap: 11 (ref 5–15)
BUN: 14 mg/dL (ref 8–23)
CO2: 24 mmol/L (ref 22–32)
Calcium: 9.1 mg/dL (ref 8.9–10.3)
Chloride: 111 mmol/L (ref 98–111)
Creatinine, Ser: 0.93 mg/dL (ref 0.61–1.24)
GFR calc Af Amer: >60 mL/min (ref >60)
GFR calc non Af Amer: >60 mL/min (ref >60)
Glucose, Bld: 81 mg/dL (ref 70–99)
Potassium: 4.3 mmol/L (ref 3.5–5.1)
Sodium: 146 mmol/L — ABNORMAL HIGH (ref 135–145)
Total Bilirubin: 0.3 mg/dL (ref 0.3–1.2)
Total Protein: 7.2 g/dL (ref 6.5–8.1)

## 2018-01-02 LAB — CBC
HCT: 40.5 % (ref 39.0–52.0)
Hemoglobin: 13.1 g/dL (ref 13.0–17.0)
MCH: 30.4 pg (ref 26.0–34.0)
MCHC: 32.3 g/dL (ref 30.0–36.0)
MCV: 94 fL (ref 78.0–100.0)
Platelets: 231 10*3/uL (ref 150–400)
RBC: 4.31 MIL/uL (ref 4.22–5.81)
RDW: 16.2 % — ABNORMAL HIGH (ref 11.5–15.5)
WBC: 3.6 10*3/uL — ABNORMAL LOW (ref 4.0–10.5)

## 2018-01-02 LAB — RAPID URINE DRUG SCREEN, HOSP PERFORMED
Amphetamines: NOT DETECTED
Barbiturates: NOT DETECTED
Benzodiazepines: NOT DETECTED
Cocaine: NOT DETECTED
Opiates: NOT DETECTED
Tetrahydrocannabinol: NOT DETECTED

## 2018-01-02 LAB — ACETAMINOPHEN LEVEL: Acetaminophen (Tylenol), Serum: <10 ug/mL — ABNORMAL LOW (ref 10–30)

## 2018-01-02 LAB — ETHANOL: Alcohol, Ethyl (B): 67 mg/dL — ABNORMAL HIGH (ref <10)

## 2018-01-02 LAB — SALICYLATE LEVEL: Salicylate Lvl: <7 mg/dL (ref 2.8–30.0)

## 2018-01-02 NOTE — ED Notes (Signed)
Bed: WA29 Expected date:  Expected time:  Means of arrival:  Comments: EMS 62 yo male from home with depression and SI
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
## 2018-01-02 NOTE — Progress Notes (Signed)
TTS consulted with Nira Conn, NP who recommends inpt treatment. TTS to seek placement. EDP Mancel Bale, MD and pt's nurse have been advised of the disposition.  Princess Bruins, MSW, LCSW Therapeutic Triage Specialist  [phone]

## 2018-01-02 NOTE — ED Provider Notes (Signed)
[facility]-EMERGENCY DEPT Provider Note   CSN: [id number] Arrival date & time: 01/02/18  2013     History   Chief Complaint Chief Complaint  Patient presents with  . Suicidal    HPI [name] is a 62 y.o. male.  HPI  Patient was at home today when he called a suicide line to state that he was suicidal.  Police officers came to his home and brought him here, voluntarily.  He has plans to either jump off a bridge or injure himself with knives.  He states he has been having thoughts of suicide for 1 year.  He is distressed over the diagnosis of multiple sclerosis.  He drinks alcohol intermittently, and today he had "11 Budweiser's."  He denies recent fever, chills, cough, shortness of breath, chest pain, focal weakness or paresthesia.  There are no other no modifying factors.  Past Medical History:  Diagnosis Date  . Depression   . Mental disorder   . Movement disorder   . MS (multiple sclerosis) (HCC)   . Multiple sclerosis, relapsing-remitting (HCC)   . Neuromuscular disorder (HCC)   . Vision abnormalities     Patient Active Problem List   Diagnosis Date Noted  . Altered mental status   . Acute encephalopathy   . Goals of care, counseling/discussion   . Palliative care encounter   . GI bleed 10/18/2017  . Symptomatic anemia   . Upper GI bleeding 10/16/2017  . Coccygeal pain, acute 04/08/2017  . Elevated TSH 04/06/2016  . Bipolar I disorder (HCC) 04/05/2016  . 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 Numbness 01/16/2016  . Alcohol abuse 07/16/2015  . 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 Other fatigue 06/29/2014  . Gait disturbance 06/29/2014  . Urinary urgency 06/29/2014  . High risk medication use 06/29/2014  . Bipolar affective disorder, depressed, severe (HCC) 09/18/2012    Class: Chronic  . MS (multiple sclerosis) (HCC) 10/31/2011    Class: Chronic  . Dysthymia 05/11/2011    Past Surgical History:    Procedure Laterality Date  . BIOPSY  10/17/2017   Procedure: BIOPSY;  Surgeon: Kathi Der, MD;  Location: [facility];  Service: Gastroenterology;;  . ESOPHAGOGASTRODUODENOSCOPY (EGD) WITH PROPOFOL N/A 10/17/2017   Procedure: ESOPHAGOGASTRODUODENOSCOPY (EGD) WITH PROPOFOL;  Surgeon: Kathi Der, MD;  Location: [facility];  Service: Gastroenterology;  Laterality: N/A;  . IR ANGIOGRAM SELECTIVE EACH ADDITIONAL VESSEL  10/21/2017  . IR ANGIOGRAM SELECTIVE EACH ADDITIONAL VESSEL  10/21/2017  . IR ANGIOGRAM VISCERAL SELECTIVE  10/21/2017  . IR EMBO ART  VEN HEMORR LYMPH EXTRAV  INC GUIDE ROADMAPPING  10/21/2017  . IR US GUIDE VASC ACCESS RIGHT  10/21/2017  . TONSILLECTOMY          Home Medications    Prior to Admission medications   Medication Sig Start Date End Date Taking? Authorizing Provider  Dimethyl Fumarate (TECFIDERA) 240 MG CPDR Take 1 capsule (240 mg total) by mouth 2 (two) times daily. 07/31/17  Yes Sater, Pearletha Furl, MD  folic acid (FOLVITE) 1 MG tablet Take 1 tablet (1 mg total) by mouth daily. 11/12/17  Yes Elgergawy, Leana Roe, MD  Multiple Vitamin (MULTIVITAMIN WITH MINERALS) TABS tablet Take 1 tablet by mouth daily. 11/12/17  Yes Elgergawy, Leana Roe, MD  OLANZapine (ZYPREXA) 10 MG tablet Take 1 tablet (10 mg total) by mouth at bedtime. 11/11/17  Yes Elgergawy, Leana Roe, MD  pantoprazole sodium (PROTONIX) 40 mg/20 mL PACK Place 20 mLs (40 mg  total) into feeding tube 2 (two) times daily before a meal. Patient taking differently: Take 40 mg by mouth daily.  11/11/17  Yes Elgergawy, Leana Roe, MD  acetaminophen (TYLENOL) 325 MG tablet Take 2 tablets (650 mg total) by mouth every 6 (six) hours as needed for mild pain (or Fever >/= 101). Patient not taking: Reported on 01/02/2018 11/11/17   Elgergawy, Leana Roe, MD  feeding supplement, ENSURE ENLIVE, (ENSURE ENLIVE) LIQD Take 237 mLs by mouth 3 (three) times daily between meals. Patient not taking: Reported on 01/02/2018 11/11/17    Elgergawy, Leana Roe, MD  polyethylene glycol (MIRALAX / GLYCOLAX) packet Take 17 g by mouth daily as needed for moderate constipation. Patient not taking: Reported on 01/02/2018 11/11/17   Elgergawy, Leana Roe, MD  senna-docusate (SENOKOT-S) 8.6-50 MG tablet Take 1 tablet by mouth at bedtime. Patient not taking: Reported on 01/02/2018 11/11/17   Elgergawy, Leana Roe, MD  thiamine 100 MG tablet Take 1 tablet (100 mg total) by mouth daily. Patient not taking: Reported on 01/02/2018 11/12/17   Elgergawy, Leana Roe, MD  traZODone (DESYREL) 50 MG tablet Take 0.5 tablets (25 mg total) by mouth at bedtime as needed for sleep. Patient not taking: Reported on 01/02/2018 11/11/17   Elgergawy, Leana Roe, MD  vitamin B-12 1000 MCG tablet Take 1 tablet (1,000 mcg total) by mouth daily. Patient not taking: Reported on 01/02/2018 11/12/17   Elgergawy, Leana Roe, MD    Family History Family History  Problem Relation Age of Onset  . Heart disease Mother   . Stroke Father     Social History Social History   Tobacco Use  . Smoking status: Current Every Day Smoker    Packs/day: 3.00    Years: 36.00    Pack years: 108.00    Types: Cigarettes  . Smokeless tobacco: Never Used  Substance Use Topics  . Alcohol use: Yes    Alcohol/week: 0.0 standard drinks    Comment: 11 beers a day x "many years"  . Drug use: Yes    Types: Marijuana    Comment: 1-2 times a month     Allergies   Patient has no known allergies.   Review of Systems Review of Systems  All other systems reviewed and are negative.    Physical Exam Updated Vital Signs BP 119/83 (BP Location: Right Arm)   Pulse 92   Temp 98.3 F (36.8 C) (Oral)   Resp 17   SpO2 99%   Physical Exam  Constitutional: He is oriented to person, place, and time. He appears well-developed and well-nourished.  HENT:  Head: Normocephalic and atraumatic.  Right Ear: External ear normal.  Left Ear: External ear normal.  Eyes: Pupils are equal, round, and  reactive to light. Conjunctivae and EOM are normal.  Neck: Normal range of motion and phonation normal. Neck supple.  Cardiovascular: Normal rate.  Pulmonary/Chest: Effort normal. He exhibits no bony tenderness.  Musculoskeletal: Normal range of motion.  Neurological: He is alert and oriented to person, place, and time. No cranial nerve deficit or sensory deficit. He exhibits normal muscle tone. Coordination normal.  No dysarthria or aphasia.  Skin: Skin is warm, dry and intact.  Psychiatric: His behavior is normal. Judgment and thought content normal.  He appears sad.  He does not appear to be responding to internal stimuli  Nursing note and vitals reviewed.    ED Treatments / Results  Labs (all labs ordered are listed, but only abnormal results are displayed) Labs Reviewed  COMPREHENSIVE METABOLIC PANEL - Abnormal; Notable for the following components:      Result Value   Sodium 146 (*)    All other components within normal limits  ETHANOL - Abnormal; Notable for the following components:   Alcohol, Ethyl (B) 67 (*)    All other components within normal limits  ACETAMINOPHEN LEVEL - Abnormal; Notable for the following components:   Acetaminophen (Tylenol), Serum <10 (*)    All other components within normal limits  SALICYLATE LEVEL  RAPID URINE DRUG SCREEN, HOSP PERFORMED  CBC    EKG None  Radiology No results found.  Procedures Procedures (including critical care time)  Medications Ordered in ED Medications - No data to display   Initial Impression / Assessment and Plan / ED Course  I have reviewed the triage vital signs and the nursing notes.  Pertinent labs & imaging results that were available during my care of the patient were reviewed by me and considered in my medical decision making (see chart for details).  Clinical Course as of Jan 02 2258  Thu Jan 02, 2018  2258 Normal  Acetaminophen level(!) [EW]  2258 Elevated, mild intoxication  Ethanol(!) [EW]    2258 Normal  Salicylate level [EW]  2258 Normal except sodium elevated  Comprehensive metabolic panel(!) [EW]    Clinical Course User Index [EW] Mancel Bale, MD     Patient Vitals for the past 24 hrs:  BP Temp Temp src Pulse Resp SpO2  01/02/18 2030 119/83 98.3 F (36.8 C) Oral 92 17 99 %    10:59 PM Reevaluation with update and discussion. After initial assessment and treatment, an updated evaluation reveals no change in clinical status, findings discussed with patient, all questions answered. Mancel Bale   Medical Decision Making: Suicidal ideation with apparent intermittent binge drinking.  Patient has a plan to jump off a bridge or cut self with knives.  CRITICAL CARE-no Performed by: Mancel Bale   Nursing Notes Reviewed/ Care Coordinated Applicable Imaging Reviewed Interpretation of Laboratory Data incorporated into ED treatment  TTS consultation  Plan-as per TTS in conjunction with oncoming provider team    Final Clinical Impressions(s) / ED Diagnoses   Final diagnoses:  Suicidal ideation    ED Discharge Orders    None       Mancel Bale, MD 01/02/18 2300

## 2018-01-02 NOTE — ED Triage Notes (Signed)
Pt here from home via EMS with reports of a long history of depression. Pt has active SI with no plan. Pt reports he has MS and no support system. Pt reports 11 beers PTA and has been drinking at this rate "for years". Pt reports depression for years but is unsure if he is taking any medications for this and does not see a Veterinary surgeon. Pt reports "many" psychiatric hospitalizations, the last of which was about 1 year ago. Pt denies HI but reports VH when he wears his glasses of "people and animals". Pt contracts for safety.

## 2018-01-03 ENCOUNTER — Other Ambulatory Visit: Payer: Self-pay

## 2018-01-03 DIAGNOSIS — F17210 Nicotine dependence, cigarettes, uncomplicated: Secondary | ICD-10-CM

## 2018-01-03 DIAGNOSIS — F319 Bipolar disorder, unspecified: Secondary | ICD-10-CM

## 2018-01-03 MED ORDER — ADULT MULTIVITAMIN W/MINERALS CH
1.0000 | ORAL_TABLET | Freq: Every day | ORAL | Status: DC
  Administered 2018-01-03: 1 via ORAL
  Filled 2018-01-03: qty 1

## 2018-01-03 MED ORDER — OLANZAPINE 10 MG PO TABS: 10.0000 mg | ORAL_TABLET | Freq: Every day | ORAL | Status: DC

## 2018-01-03 MED ORDER — DIMETHYL FUMARATE 240 MG PO CPDR: 240.0000 mg | DELAYED_RELEASE_CAPSULE | Freq: Two times a day (BID) | ORAL | Status: DC

## 2018-01-03 MED ORDER — FOLIC ACID 1 MG PO TABS
1.0000 mg | ORAL_TABLET | Freq: Every day | ORAL | Status: DC
  Administered 2018-01-03: 1 mg via ORAL
  Filled 2018-01-03: qty 1

## 2018-01-03 MED ORDER — PANTOPRAZOLE SODIUM 40 MG PO PACK
40.0000 mg | PACK | Freq: Two times a day (BID) | ORAL | Status: DC
  Filled 2018-01-03: qty 20

## 2018-01-03 NOTE — [facility] Suicide Risk Assessment (Signed)
Suicide Risk Assessment  Discharge Assessment   [facility] Discharge Suicide Risk Assessment   Principal Problem: Bipolar I disorder [facility]) Discharge Diagnoses:  Patient Active Problem List   Diagnosis Date Noted  . Bipolar I disorder (HCC) [F31.9] 04/05/2016    Priority: High  . Alcohol abuse [F10.10] 07/16/2015    Priority: High  . Dysthymia [F34.1] 05/11/2011    Priority: High  . Altered mental status [R41.82]   . Acute encephalopathy [G93.40]   . Goals of care, counseling/discussion [Z71.89]   . Palliative care encounter [Z51.5]   . GI bleed [K92.2] 10/18/2017  . Symptomatic anemia [D64.9]   . Upper GI bleeding [K92.2] 10/16/2017  . Coccygeal pain, acute [M53.3] 04/08/2017  . Elevated TSH [R79.89] 04/06/2016  . 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 Numbness [R20.0] 01/16/2016  . Other fatigue [R53.83] 06/29/2014  . Gait disturbance [R26.9] 06/29/2014  . Urinary urgency [R39.15] 06/29/2014  . High risk medication use [Z79.899] 06/29/2014  . MS (multiple sclerosis) (HCC) [G35] 10/31/2011    Class: Chronic    Total Time spent with patient: 45 minutes  Musculoskeletal: Strength & Muscle Tone: within normal limits Gait & Station: normal Patient leans: N/A  Psychiatric Specialty Exam:   Blood pressure 115/60, pulse 82, temperature 98.4 F (36.9 C), temperature source Oral, resp. rate 18, SpO2 94 %.There is no height or weight on file to calculate BMI.  General Appearance: Casual  Eye Contact::  Good  Speech:  Normal Rate409  Volume:  Normal  Mood:  Depressed, mild  Affect:  Congruent  Thought Process:  Coherent and Descriptions of Associations: Intact  Orientation:  Full (Time, Place, and Person)  Thought Content:  WDL and Logical  Suicidal Thoughts:  No  Homicidal Thoughts:  No  Memory:  Immediate;   Good Recent;   Good Remote;   Good  Judgement:  Good  Insight:  Good  Psychomotor Activity:  Normal  Concentration:  Good  Recall:  Good   Fund of Knowledge:Good  Language: Good  Akathisia:  No  Handed:  Right  AIMS (if indicated):     Assets:  Housing Leisure Time Physical Health Resilience Social Support  Sleep:     Cognition: WNL  ADL's:  Intact   Mental Status Per Nursing Assessment::   On Admission:   depression  Demographic Factors:  Male and Caucasian  Loss Factors: NA  Historical Factors: NA  Risk Reduction Factors:   Sense of responsibility to family, Living with another person, especially a relative and Positive social support  Continued Clinical Symptoms:  Depression, mild  Cognitive Features That Contribute To Risk:  None    Suicide Risk:  Minimal: No identifiable suicidal ideation.  Patients presenting with no risk factors but with morbid ruminations; may be classified as minimal risk based on the severity of the depressive symptoms    Plan Of Care/Follow-up recommendations:  Activity:  as tolerated Diet:  heart healthy diet  LORD, JAMISON, NP 01/03/2018, 3:16 PM

## 2018-01-03 NOTE — Progress Notes (Signed)
Pt. Stating wanting to go home. Talked to TTS and NP [facility] Pt. Stated that he felt safe to go home and had a ride on the way. NP made aware. Pt. Status changed to ready to discharge. Will DC when ride is here.

## 2018-01-03 NOTE — Care Management Note (Signed)
Case Management Note  CM received a call from [facility] with [facility] to advise that the pt is active with their services.  She will follow for needs.  Ciria Bernardini, Lynnae Sandhoff, RN 01/03/2018, 9:43 AM

## 2018-01-03 NOTE — Consult Note (Signed)
[facility] Consult   Reason for Consult:  Depression  Referring Physician:  EDP Patient Identification: [name] MRN:  [id number] Principal Diagnosis: Bipolar I disorder ([place]) Diagnosis:   Patient Active Problem List   Diagnosis Date Noted  . Bipolar I disorder ([place]) [F31.9] 04/05/2016    Priority: High  . Alcohol abuse [F10.10] 07/16/2015    Priority: High  . Dysthymia [F34.1] 05/11/2011    Priority: High  . Altered mental status [R41.82]   . Acute encephalopathy [G93.40]   . Goals of care, counseling/discussion [Z71.89]   . Palliative care encounter [Z51.5]   . GI bleed [K92.2] 10/18/2017  . Symptomatic anemia [D64.9]   . Upper GI bleeding [K92.2] 10/16/2017  . Coccygeal pain, acute [M53.3] 04/08/2017  . Elevated TSH [R79.89] 04/06/2016  . 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 Numbness [R20.0] 01/16/2016  . Other fatigue [R53.83] 06/29/2014  . Gait disturbance [R26.9] 06/29/2014  . Urinary urgency [R39.15] 06/29/2014  . High risk medication use [Z79.899] 06/29/2014  . MS (multiple sclerosis) ([place]) [G35] 10/31/2011    Class: Chronic    Total Time spent with patient: 45 minutes  Subjective:   [name] is a 62 y.o. male patient does not warrant admission.  HPI:  62 yo male who presents to the ED with issues with his roommate and depression.  This has been an ongoing issue but yesterday he needed a break after a verbal altercation with him.  Today on assessment, no suicidal/homicidal ideations, hallucinations, or withdrawal symptoms.  His depression started years ago and remains at a moderate level, alcohol makes it worse and peace/quiet makes it better.  [name] does not go anywhere for outpatient psychiatric services, does not use resources provided in the past and does not want any now.  Minimizes his alcohol use and denies every having withdrawal symptoms.  Reports he is ready to leave and return home but the psychiatrist  would like him to stay until tomorrow to let things simmer down from his roommate issue.  However, he is voluntary and can leave if he wants.  Refused peer support consult also.    Past Psychiatric History: depression, dysthymic mood, alcohol abuse   Risk to Self: NOne Risk to Others:None Prior Inpatient Therapy: Prior Inpatient Therapy: Yes Prior Therapy Dates: 2014, 2013, and several other admissions  Prior Therapy Facilty/Provider(s): [facility], [facility], and others  Reason for Treatment: Severe recurrent major depression without psychotic features  Prior Outpatient Therapy: Prior Outpatient Therapy: Yes Prior Therapy Dates: 2015 Prior Therapy Facilty/Provider(s): [facility] Reason for Treatment: med management  Does patient have an ACCT team?: No Does patient have Intensive In-House Services?  : No Does patient have [facility] services? : No Does patient have P4CC services?: No  Past Medical History:  Past Medical History:  Diagnosis Date  . Depression   . Mental disorder   . Movement disorder   . MS (multiple sclerosis) ([place])   . Multiple sclerosis, relapsing-remitting ([place])   . Neuromuscular disorder ([place])   . Vision abnormalities     Past Surgical History:  Procedure Laterality Date  . BIOPSY  10/17/2017   Procedure: BIOPSY;  Surgeon: Otis Brace, MD;  Location: [facility];  Service: Gastroenterology;;  . ESOPHAGOGASTRODUODENOSCOPY (EGD) WITH PROPOFOL N/A 10/17/2017   Procedure: ESOPHAGOGASTRODUODENOSCOPY (EGD) WITH PROPOFOL;  Surgeon: Otis Brace, MD;  Location: [facility];  Service: Gastroenterology;  Laterality: N/A;  . IR ANGIOGRAM SELECTIVE EACH ADDITIONAL VESSEL  10/21/2017  . [place]  ADDITIONAL VESSEL  10/21/2017  . IR ANGIOGRAM VISCERAL SELECTIVE  10/21/2017  . IR EMBO ART  VEN HEMORR LYMPH EXTRAV  INC GUIDE ROADMAPPING  10/21/2017  . IR US GUIDE VASC ACCESS RIGHT  10/21/2017  . TONSILLECTOMY     Family History:  Family History  Problem Relation  Age of Onset  . Heart disease Mother   . Stroke Father    Family Psychiatric  History: none Social History:  Social History   Substance and Sexual Activity  Alcohol Use Yes  . Alcohol/week: 0.0 standard drinks   Comment: 11 beers a day x "many years"     Social History   Substance and Sexual Activity  Drug Use Yes  . Types: Marijuana   Comment: 1-2 times a month    Social History   Socioeconomic History  . Marital status: Legally Separated    Spouse name: Not on file  . Number of children: Not on file  . Years of education: Not on file  . Highest education level: Not on file  Occupational History  . Not on file  Social Needs  . Financial resource strain: Not on file  . Food insecurity:    Worry: Not on file    Inability: Not on file  . Transportation needs:    Medical: Not on file    Non-medical: Not on file  Tobacco Use  . Smoking status: Current Every Day Smoker    Packs/day: 3.00    Years: 36.00    Pack years: 108.00    Types: Cigarettes  . Smokeless tobacco: Never Used  Substance and Sexual Activity  . Alcohol use: Yes    Alcohol/week: 0.0 standard drinks    Comment: 11 beers a day x "many years"  . Drug use: Yes    Types: Marijuana    Comment: 1-2 times a month  . Sexual activity: Not Currently    Birth control/protection: None  Lifestyle  . Physical activity:    Days per week: Not on file    Minutes per session: Not on file  . Stress: Not on file  Relationships  . Social connections:    Talks on phone: Not on file    Gets together: Not on file    Attends religious service: Not on file    Active member of club or organization: Not on file    Attends meetings of clubs or organizations: Not on file    Relationship status: Not on file  Other Topics Concern  . Not on file  Social History Narrative   ** Merged History Encounter **       Additional Social History:    Allergies:  No Known Allergies  Labs:  Results for orders placed or  performed during the hospital encounter of 01/02/18 (from the past 48 hour(s))  Rapid urine drug screen (hospital performed)     Status: None   Collection Time: 01/02/18  8:42 PM  Result Value Ref Range   Opiates NONE DETECTED NONE DETECTED   Cocaine NONE DETECTED NONE DETECTED   Benzodiazepines NONE DETECTED NONE DETECTED   Amphetamines NONE DETECTED NONE DETECTED   Tetrahydrocannabinol NONE DETECTED NONE DETECTED   Barbiturates NONE DETECTED NONE DETECTED    Comment: (NOTE) DRUG SCREEN FOR MEDICAL PURPOSES ONLY.  IF CONFIRMATION IS NEEDED FOR ANY PURPOSE, NOTIFY LAB WITHIN 5 DAYS. LOWEST DETECTABLE LIMITS FOR URINE DRUG SCREEN Drug Class  Cutoff (ng/mL) Amphetamine and metabolites    1000 Barbiturate and metabolites    200 Benzodiazepine                 372 Tricyclics and metabolites     300 Opiates and metabolites        300 Cocaine and metabolites        300 THC                            50 Performed at [facility], [place] [address], [place], [place] [address]   Comprehensive metabolic panel     Status: Abnormal   Collection Time: 01/02/18  8:58 PM  Result Value Ref Range   Sodium 146 (H) 135 - 145 mmol/L   Potassium 4.3 3.5 - 5.1 mmol/L   Chloride 111 98 - 111 mmol/L   CO2 24 22 - 32 mmol/L   Glucose, Bld 81 70 - 99 mg/dL   BUN 14 8 - 23 mg/dL   Creatinine, Ser 0.93 0.61 - 1.24 mg/dL   Calcium 9.1 8.9 - 10.3 mg/dL   Total Protein 7.2 6.5 - 8.1 g/dL   Albumin 4.0 3.5 - 5.0 g/dL   AST 19 15 - 41 U/L   ALT 13 0 - 44 U/L   Alkaline Phosphatase 51 38 - 126 U/L   Total Bilirubin 0.3 0.3 - 1.2 mg/dL   GFR calc non Af Amer >60 >60 mL/min   GFR calc Af Amer >60 >60 mL/min    Comment: (NOTE) The eGFR has been calculated using the CKD EPI equation. This calculation has not been validated in all clinical situations. eGFR's persistently <60 mL/min signify possible Chronic Kidney Disease.    Anion gap 11 5 - 15    Comment: Performed  at [facility], [place] [address], [place], [place] [address]  Ethanol     Status: Abnormal   Collection Time: 01/02/18  8:58 PM  Result Value Ref Range   Alcohol, Ethyl (B) 67 (H) <10 mg/dL    Comment: (NOTE) Lowest detectable limit for serum alcohol is 10 mg/dL. For medical purposes only. Performed at [facility], [place] [address] [place], [place] [address]   Salicylate level     Status: None   Collection Time: 01/02/18  8:58 PM  Result Value Ref Range   Salicylate Lvl <3.6 2.8 - 30.0 mg/dL    Comment: Performed at [facility], [place] [address], [place], [place] [address]  Acetaminophen level     Status: Abnormal   Collection Time: 01/02/18  8:58 PM  Result Value Ref Range   Acetaminophen (Tylenol), Serum <10 (L) 10 - 30 ug/mL    Comment: (NOTE) Therapeutic concentrations vary significantly. A range of 10-30 ug/mL  may be an effective concentration for many patients. However, some  are best treated at concentrations outside of this range. Acetaminophen concentrations >150 ug/mL at 4 hours after ingestion  and >50 ug/mL at 12 hours after ingestion are often associated with  toxic reactions. Performed at [facility], [place] [address], [place], [place] [address]   cbc     Status: Abnormal   Collection Time: 01/02/18  8:58 PM  Result Value Ref Range   WBC 3.6 (L) 4.0 - 10.5 K/uL   RBC 4.31 4.22 - 5.81 MIL/uL   Hemoglobin 13.1 13.0 - 17.0 g/dL   HCT 40.5 39.0 - 52.0 %  MCV 94.0 78.0 - 100.0 fL   MCH 30.4 26.0 - 34.0 pg   MCHC 32.3 30.0 - 36.0 g/dL   RDW 16.2 (H) 11.5 - 15.5 %   Platelets 231 150 - 400 K/uL    Comment: Performed at [facility], [place] [address], [place], [place] [address]    Current Facility-Administered Medications  Medication Dose Route Frequency Provider Last Rate Last Dose  . Dimethyl Fumarate CPDR 240 mg  240 mg Oral BID Patrecia Pour, NP      . folic acid  (FOLVITE) tablet 1 mg  1 mg Oral Daily Patrecia Pour, NP      . multivitamin with minerals tablet 1 tablet  1 tablet Oral Daily Patrecia Pour, NP      . OLANZapine (ZYPREXA) tablet 10 mg  10 mg Oral QHS Patrecia Pour, NP      . pantoprazole sodium (PROTONIX) 40 mg/20 mL oral suspension 40 mg  40 mg Per Tube BID AC Patrecia Pour, NP       Current Outpatient Medications  Medication Sig Dispense Refill  . Dimethyl Fumarate (TECFIDERA) 240 MG CPDR Take 1 capsule (240 mg total) by mouth 2 (two) times daily. 536 capsule 3  . folic acid (FOLVITE) 1 MG tablet Take 1 tablet (1 mg total) by mouth daily.    . Multiple Vitamin (MULTIVITAMIN WITH MINERALS) TABS tablet Take 1 tablet by mouth daily.    [name] OLANZapine (ZYPREXA) 10 MG tablet Take 1 tablet (10 mg total) by mouth at bedtime.    . pantoprazole sodium (PROTONIX) 40 mg/20 mL PACK Place 20 mLs (40 mg total) into feeding tube 2 (two) times daily before a meal. (Patient taking differently: Take 40 mg by mouth daily. ) 30 each     Musculoskeletal: Strength & Muscle Tone: within normal limits Gait & Station: unsteady Patient leans: N/A  Psychiatric Specialty Exam: Physical Exam  Nursing note and vitals reviewed. Constitutional: He is oriented to person, place, and time. He appears well-developed and well-nourished.  HENT:  Head: Normocephalic.  Neck: Normal range of motion.  Respiratory: Effort normal.  Musculoskeletal: Normal range of motion.  Neurological: He is alert and oriented to person, place, and time.  Psychiatric: His speech is normal and behavior is normal. Judgment and thought content normal. His affect is blunt. Cognition and memory are normal. He exhibits a depressed mood.    Review of Systems  Psychiatric/Behavioral: Positive for depression and substance abuse.  All other systems reviewed and are negative.   Blood pressure 130/80, pulse 92, temperature 98.1 F (36.7 C), temperature source Oral, resp. rate 16, SpO2 98  %.There is no height or weight on file to calculate BMI.  General Appearance: Casual  Eye Contact:  Good  Speech:  Normal Rate  Volume:  Normal  Mood:  Depressed, moderate  Affect:  Blunt  Thought Process:  Coherent and Descriptions of Associations: Intact  Orientation:  Full (Time, Place, and Person)  Thought Content:  WDL and Logical  Suicidal Thoughts:  No  Homicidal Thoughts:  No  Memory:  Immediate;   Good Recent;   Good Remote;   Good  Judgement:  Fair  Insight:  Fair  Psychomotor Activity:  Normal  Concentration:  Concentration: Good and Attention Span: Good  Recall:  Good  Fund of Knowledge:  Fair  Language:  Good  Akathisia:  No  Handed:  Right  AIMS (if indicated):     Assets:  Housing  Leisure Time Physical Health Resilience Social Support  ADL's:  Intact  Cognition:  WNL  Sleep:        Treatment Plan Summary: Daily contact with patient to assess and evaluate symptoms and progress in treatment, Medication management and Plan bipolar affective disorder, depressed, moderate:  -Start Zyprexa 10 mg at bedtime for mood -Continue medical medications  Disposition: Supportive therapy provided about ongoing stressors.  Waylan Boga, NP 01/03/2018 10:34 AM

## 2018-01-06 ENCOUNTER — Other Ambulatory Visit: Payer: Self-pay

## 2018-01-06 DIAGNOSIS — R609 Edema, unspecified: Secondary | ICD-10-CM

## 2018-01-08 DIAGNOSIS — F17210 Nicotine dependence, cigarettes, uncomplicated: Secondary | ICD-10-CM | POA: Diagnosis not present

## 2018-01-08 DIAGNOSIS — D649 Anemia, unspecified: Secondary | ICD-10-CM | POA: Diagnosis not present

## 2018-01-08 DIAGNOSIS — Z9181 History of falling: Secondary | ICD-10-CM | POA: Diagnosis not present

## 2018-01-08 DIAGNOSIS — F314 Bipolar disorder, current episode depressed, severe, without psychotic features: Secondary | ICD-10-CM | POA: Diagnosis not present

## 2018-01-08 DIAGNOSIS — G35 Multiple sclerosis: Secondary | ICD-10-CM | POA: Diagnosis not present

## 2018-01-08 DIAGNOSIS — F1910 Other psychoactive substance abuse, uncomplicated: Secondary | ICD-10-CM | POA: Diagnosis not present

## 2018-01-08 DIAGNOSIS — E46 Unspecified protein-calorie malnutrition: Secondary | ICD-10-CM | POA: Diagnosis not present

## 2018-01-08 DIAGNOSIS — H532 Diplopia: Secondary | ICD-10-CM | POA: Diagnosis not present

## 2018-01-09 ENCOUNTER — Encounter: Payer: Self-pay | Admitting: Vascular Surgery

## 2018-01-09 ENCOUNTER — Ambulatory Visit (HOSPITAL_COMMUNITY)
Admission: RE | Admit: 2018-01-09 | Discharge: 2018-01-09 | Disposition: A | Discharge status: Home / Self Care | Payer: Medicare HMO | Source: Ambulatory Visit | Attending: Vascular Surgery | Admitting: Vascular Surgery

## 2018-01-09 ENCOUNTER — Ambulatory Visit (INDEPENDENT_AMBULATORY_CARE_PROVIDER_SITE_OTHER): Payer: Medicare HMO | Admitting: Vascular Surgery

## 2018-01-09 VITALS — BP 121/76 | HR 74 | Temp 97.6°F | Resp 16 | Ht 71.0 in | Wt 173.9 lb

## 2018-01-09 DIAGNOSIS — R609 Edema, unspecified: Secondary | ICD-10-CM | POA: Diagnosis not present

## 2018-01-09 DIAGNOSIS — M7989 Other specified soft tissue disorders: Secondary | ICD-10-CM

## 2018-01-09 DIAGNOSIS — I872 Venous insufficiency (chronic) (peripheral): Secondary | ICD-10-CM | POA: Diagnosis not present

## 2018-01-09 NOTE — Progress Notes (Signed)
Referring Physician: Hulen Shouts PA  Patient name: [name] MRN: [id number] DOB: 18-Mar-1956 Sex: male  REASON FOR CONSULT: Left foot swelling  HPI: [name] is a 62 y.o. male, who was recently discharged from the hospital after a lengthy stay for GI bleeding.  He apparently developed a decubitus ulcer on his left foot during the hospital stay.  This is now completely healed.  Patient had some left foot swelling which has also now resolved.  He denies any prominent varicose veins.  He denies prior history of DVT.  He denies family history of varicose veins.  He does not really complain of heaviness achiness or fullness in the legs.  He does not describe claudication or rest pain.  He is fairly limited in his ambulation secondary to severe MS.  Other medical problems include depression bipolar disorder tobacco abuse.  Patient states he has tried to quit smoking in the past unsuccessfully.  Past Medical History:  Diagnosis Date  . Depression   . Mental disorder   . Movement disorder   . MS (multiple sclerosis) ([facility])   . Multiple sclerosis, relapsing-remitting ([facility])   . Neuromuscular disorder ([facility])   . Vision abnormalities    Past Surgical History:  Procedure Laterality Date  . BIOPSY  10/17/2017   Procedure: BIOPSY;  Surgeon: Kathi Der, MD;  Location: [facility];  Service: Gastroenterology;;  . ESOPHAGOGASTRODUODENOSCOPY (EGD) WITH PROPOFOL N/A 10/17/2017   Procedure: ESOPHAGOGASTRODUODENOSCOPY (EGD) WITH PROPOFOL;  Surgeon: Kathi Der, MD;  Location: [facility];  Service: Gastroenterology;  Laterality: N/A;  . IR ANGIOGRAM SELECTIVE EACH ADDITIONAL VESSEL  10/21/2017  . IR ANGIOGRAM SELECTIVE EACH ADDITIONAL VESSEL  10/21/2017  . IR ANGIOGRAM VISCERAL SELECTIVE  10/21/2017  . IR EMBO ART  VEN HEMORR LYMPH EXTRAV  INC GUIDE ROADMAPPING  10/21/2017  . IR US GUIDE VASC ACCESS RIGHT  10/21/2017  . TONSILLECTOMY      Family History  Problem Relation Age of Onset  .  Heart disease Mother   . Stroke Father     SOCIAL HISTORY: Social History   Socioeconomic History  . Marital status: Legally Separated    Spouse name: Not on file  . Number of children: Not on file  . Years of education: Not on file  . Highest education level: Not on file  Occupational History  . Not on file  Social Needs  . Financial resource strain: Not on file  . Food insecurity:    Worry: Not on file    Inability: Not on file  . Transportation needs:    Medical: Not on file    Non-medical: Not on file  Tobacco Use  . Smoking status: Current Every Day Smoker    Packs/day: 3.00    Years: 36.00    Pack years: 108.00    Types: Cigarettes  . Smokeless tobacco: Never Used  Substance and Sexual Activity  . Alcohol use: Yes    Alcohol/week: 0.0 standard drinks    Comment: 11 beers a day x "many years"  . Drug use: Yes    Types: Marijuana    Comment: 1-2 times a month  . Sexual activity: Not Currently    Birth control/protection: None  Lifestyle  . Physical activity:    Days per week: Not on file    Minutes per session: Not on file  . Stress: Not on file  Relationships  . Social connections:    Talks on phone: Not on file    Gets  together: Not on file    Attends religious service: Not on file    Active member of club or organization: Not on file    Attends meetings of clubs or organizations: Not on file    Relationship status: Not on file  . Intimate partner violence:    Fear of current or ex partner: Not on file    Emotionally abused: Not on file    Physically abused: Not on file    Forced sexual activity: Not on file  Other Topics Concern  . Not on file  Social History Narrative   ** Merged History Encounter **        No Known Allergies  Current Outpatient Medications  Medication Sig Dispense Refill  . Dimethyl Fumarate (TECFIDERA) 240 MG CPDR Take 1 capsule (240 mg total) by mouth 2 (two) times daily. 180 capsule 3  . folic acid (FOLVITE) 1 MG tablet  Take 1 tablet (1 mg total) by mouth daily.    . Multiple Vitamin (MULTIVITAMIN WITH MINERALS) TABS tablet Take 1 tablet by mouth daily.    [name] OLANZapine (ZYPREXA) 10 MG tablet Take 1 tablet (10 mg total) by mouth at bedtime.    . pantoprazole sodium (PROTONIX) 40 mg/20 mL PACK Place 20 mLs (40 mg total) into feeding tube 2 (two) times daily before a meal. (Patient taking differently: Take 40 mg by mouth daily. ) 30 each    No current facility-administered medications for this visit.     ROS:   General:  No weight loss, Fever, chills  HEENT: No recent headaches, no nasal bleeding, no visual changes, no sore throat  Neurologic: No dizziness, blackouts, seizures. No recent symptoms of stroke or mini- stroke. No recent episodes of slurred speech, or temporary blindness.  Cardiac: No recent episodes of chest pain/pressure, no shortness of breath at rest.  No shortness of breath with exertion.  Denies history of atrial fibrillation or irregular heartbeat  Vascular: No history of rest pain in feet.  No history of claudication.  No history of non-healing ulcer, No history of DVT   Pulmonary: No home oxygen, no productive cough, no hemoptysis,  No asthma or wheezing  Musculoskeletal:  [ ]  Arthritis, [ ]  Low back pain,  [ ]  Joint pain  Hematologic:No history of hypercoagulable state.  No history of easy bleeding.  No history of anemia  Gastrointestinal: No hematochezia or melena,  No gastroesophageal reflux, no trouble swallowing  Urinary: [ ]  chronic Kidney disease, [ ]  on HD - [ ]  MWF or [ ]  TTHS, [ ]  Burning with urination, [ ]  Frequent urination, [ ]  Difficulty urinating;   Skin: No rashes  Psychological: No history of anxiety,  No history of depression   Physical Examination  Vitals:   01/09/18 1512  BP: 121/76  Pulse: 74  Resp: 16  Temp: 97.6 F (36.4 C)  TempSrc: Oral  SpO2: 100%  Weight: 173 lb 14.4 oz (78.9 kg)  Height: 5\' 11"  (1.803 m)    Body mass index is 24.25  kg/m.  General:  Alert and oriented, no acute distress HEENT: Normal Cardiac: Regular Rate and Rhythm Skin: No rash ulcer no prominent varicosities Extremity Pulses:  2+ radial, brachial, femoral, absent dorsalis pedis, 2+ posterior tibial pulses bilaterally Musculoskeletal: No deformity or edema  Neurologic: Upper and lower extremity motor 5/5 and symmetric  DATA:  Patient had a venous duplex exam today which showed no evidence of DVT.  He did have evidence of reflux in the  left leg with a saphenous vein diameter of 3 to 8 mm.  The 8 mm with at the saphenofemoral junction.  ASSESSMENT: Mild superficial venous reflux left leg.  Patient currently is asymptomatic.  He actually has no obvious prominent varicosities on his leg.  His symptoms seem to have resolved at this point.   PLAN: Patient will follow-up on an as-needed basis.  He was given a prescription today for bilateral lower extremity compression stockings 20 to 30 mmHg knee-high.   Fabienne Bruns, MD [facility] Office: [phone] Pager: [phone]

## 2018-01-16 DIAGNOSIS — F1910 Other psychoactive substance abuse, uncomplicated: Secondary | ICD-10-CM | POA: Diagnosis not present

## 2018-01-16 DIAGNOSIS — Z9181 History of falling: Secondary | ICD-10-CM | POA: Diagnosis not present

## 2018-01-16 DIAGNOSIS — F17210 Nicotine dependence, cigarettes, uncomplicated: Secondary | ICD-10-CM | POA: Diagnosis not present

## 2018-01-16 DIAGNOSIS — G35 Multiple sclerosis: Secondary | ICD-10-CM | POA: Diagnosis not present

## 2018-01-16 DIAGNOSIS — H532 Diplopia: Secondary | ICD-10-CM | POA: Diagnosis not present

## 2018-01-16 DIAGNOSIS — D649 Anemia, unspecified: Secondary | ICD-10-CM | POA: Diagnosis not present

## 2018-01-16 DIAGNOSIS — E46 Unspecified protein-calorie malnutrition: Secondary | ICD-10-CM | POA: Diagnosis not present

## 2018-01-16 DIAGNOSIS — F314 Bipolar disorder, current episode depressed, severe, without psychotic features: Secondary | ICD-10-CM | POA: Diagnosis not present

## 2018-01-21 DIAGNOSIS — G35 Multiple sclerosis: Secondary | ICD-10-CM | POA: Diagnosis not present

## 2018-01-21 DIAGNOSIS — F1910 Other psychoactive substance abuse, uncomplicated: Secondary | ICD-10-CM | POA: Diagnosis not present

## 2018-01-21 DIAGNOSIS — F17210 Nicotine dependence, cigarettes, uncomplicated: Secondary | ICD-10-CM | POA: Diagnosis not present

## 2018-01-21 DIAGNOSIS — F314 Bipolar disorder, current episode depressed, severe, without psychotic features: Secondary | ICD-10-CM | POA: Diagnosis not present

## 2018-01-21 DIAGNOSIS — Z9181 History of falling: Secondary | ICD-10-CM | POA: Diagnosis not present

## 2018-01-21 DIAGNOSIS — H532 Diplopia: Secondary | ICD-10-CM | POA: Diagnosis not present

## 2018-01-21 DIAGNOSIS — E46 Unspecified protein-calorie malnutrition: Secondary | ICD-10-CM | POA: Diagnosis not present

## 2018-01-21 DIAGNOSIS — D649 Anemia, unspecified: Secondary | ICD-10-CM | POA: Diagnosis not present

## 2018-02-05 DIAGNOSIS — D649 Anemia, unspecified: Secondary | ICD-10-CM | POA: Diagnosis not present

## 2018-02-05 DIAGNOSIS — E785 Hyperlipidemia, unspecified: Secondary | ICD-10-CM | POA: Diagnosis not present

## 2018-02-05 DIAGNOSIS — F17290 Nicotine dependence, other tobacco product, uncomplicated: Secondary | ICD-10-CM | POA: Diagnosis not present

## 2018-02-05 DIAGNOSIS — Z87898 Personal history of other specified conditions: Secondary | ICD-10-CM | POA: Diagnosis not present

## 2018-02-05 DIAGNOSIS — K269 Duodenal ulcer, unspecified as acute or chronic, without hemorrhage or perforation: Secondary | ICD-10-CM | POA: Diagnosis not present

## 2018-03-05 ENCOUNTER — Encounter

## 2018-03-05 ENCOUNTER — Ambulatory Visit: Payer: Medicare HMO | Admitting: Neurology

## 2018-03-06 ENCOUNTER — Ambulatory Visit: Payer: Medicare HMO | Admitting: Neurology

## 2018-03-17 ENCOUNTER — Ambulatory Visit: Payer: Medicare HMO | Admitting: Neurology

## 2018-03-17 ENCOUNTER — Encounter: Payer: Self-pay | Admitting: Neurology

## 2018-03-17 VITALS — BP 115/75 | HR 99 | Ht 71.0 in | Wt 179.5 lb

## 2018-03-17 DIAGNOSIS — F319 Bipolar disorder, unspecified: Secondary | ICD-10-CM | POA: Diagnosis not present

## 2018-03-17 DIAGNOSIS — Z79899 Other long term (current) drug therapy: Secondary | ICD-10-CM | POA: Diagnosis not present

## 2018-03-17 DIAGNOSIS — G35 Multiple sclerosis: Secondary | ICD-10-CM

## 2018-03-17 DIAGNOSIS — R269 Unspecified abnormalities of gait and mobility: Secondary | ICD-10-CM | POA: Diagnosis not present

## 2018-03-17 DIAGNOSIS — R3915 Urgency of urination: Secondary | ICD-10-CM

## 2018-03-17 DIAGNOSIS — R5383 Other fatigue: Secondary | ICD-10-CM | POA: Diagnosis not present

## 2018-03-17 NOTE — Progress Notes (Signed)
[facility]  PATIENT: [name] DOB: November 05, 1955  REFERRING DOCTOR OR PCP: Dr.  Shana Chute SOURCE: patient and records from [facility]  _________________________________   HISTORICAL  CHIEF COMPLAINT:  Chief Complaint  Patient presents with  . Follow-up    Rm 13 with rooommate, [name]. Last seen 04/08/17. No falls since last seen. No vision changes. Wears sunglasses constantly. Was at [facility] from 10/16/17-11/11/17 for upper GI bleed.   . Multiple Sclerosis    On Tecfidera. Doing well  . Gait Problem    Ambulates with cane    HISTORY OF PRESENT ILLNESS:  [name] is a 62 y.o. man with relapsing remitting multiple sclerosis and bipolar disease.   Update 03/17/2018: He feels he has been stable.     His MS is stable.   He has an MRI brain 11/02/2017.   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  It was stable compared to the 2017 MRI.  He is on Tecfidera and tolerates it well.   He has not had any exacerbations.     He uses a cane and can go a couple hundred feet without a stop but is very tired afterwards.    His strength is doing ok and is symmetric.   He denies spasticity.      He denies any bladder issues.   No numbness or tingling.      He sleeps well most nights.    Mood is stable.     He is on Zyprexa and it has stablized mood issues.    He also sees Dr. Sharyn Lull (cardiology) but is planning on finding a PCP.    He still smokes about 3/4 pack a day.   He drinks 3 x 25 oz cans of beer many days.     Update 04/08/2017: [name] continues on [place]. He is tolerating it well. He has not had any exacerbations while on it.   His last MRI showed no acute findings.     He notes his gait is off balanced.   He had to be extremely careful with the recent snow.  He shuffles but he has not had any falls  He has not noted more pain in the coccyx region. He denies any trauma or injury.   The onset was  sudden (he just woke up with it).  Pain is midline.    Pain is worse with sitting and especially when trying to stand.    He denies pain elsewhere.   No radiation into legs.   No new numbness or weakness.   No change in bladder.      He feels his voice is better and he has less stuttering. He still sometimes feels in a mental fog.[name]   He has bipolar disease.   He hasn't seen psychiatry recently due to high copay.     He stopped Abilify but is still on escitalopram, buproprion and lithium.     From 10/04/2016: He recently had a Psych admission at [facility] for depression ans suicidal ideation.    Abilify was added (has only been on a few days).  MS:   He has been on Tecfidera since late 2013 with a gap earlier this year when he ran out.   Marland KitchenHe tolerates it well and has not had any definite exacerbation while on it. A recent lymphocyte count was good at 1.1 (09/23/16).    The MRI of the brain  07/2015 was stale.   It shows T2/FLAIR hyperintense foci in a pattern and configuration consistent with multiple sclerosis.   The MRI of the cervical spine shows some degenerative changes but no  MS plaques  Tremor:  He notes a rapid fine tremor in his hands that is not too bothersome.  Of note, lithium has recently been added but he did not have tremor the last time.    Tinnitus:   He notes a ringing tinnitus bilateral that has been present a while but seems worse.   Stutter:   He feels his stuttering speech is worse and "I can't half talk" .    notes a stuttering speech since being started on Lithium.    He finds this frustrating and also notes tha the has trouble coming up with the right words.     Gait/strength/sensation: He feels gait is worse.  He stumbles more.   He relies on his cane.   Balance is poor. No recent fall.   He notes mild weakness in both legs associated with some spasticity. He denies weakness or numbness in the arms..   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   Bladder:  He notes mild hesitancy but this is stable.   He also has some urgency, frequency.   No incontinence or UTI.  e has occasional constipation but had diarrhea 2 weeks ago. He has not had any urinary or fecal incontinence in the past few years.    Vision:   He denies visual blurring.[name]  He notes diplopia when he looks either left or right.   He denies any eye pain.  Fatigue/sleep: He reports mild fatigue, about the same as usual for him.   He does not have to take naps. Amantadine helped in the past but stopped helping so he stopped.[name] He sleeps well most nights getting 7 hours or so.     Mood/cognition:    He has bipolar disease and has had multiple inpatient psychiatric admissions with various changes of medications over the years. He is on lithium and Abilify.    He has some depression but feels he is fairly stable.    MS History:   He presented with double vision in 2000.  MRI at that time showed lesions in the brain consistent with multiple sclerosis. He also had a lumbar puncture and the CSF was consistent with MS.   He was started on Avonex.  He had another episode of diplopia followed by weakness in both legs 3 years later.    He was switched to Copaxone for a while. Unfortunate, he had an another severe exacerbation with his legs this. He was started on Tysabri once a month.   For several years, he stayed to [place] but stopped as it was difficult for him to get the infusions. In 2011, he switched to [place]. 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 He had one exacerbation with weakness in 2012. He then started Tecfidera in late 2013. He has generally tolerated it well. He did not have any significant flushing or GI side effects. He has not had any definite exacerbations while on Tecfidera.      REVIEW OF  SYSTEMS: Constitutional: No fevers, chills, sweats, or change in appetite.   Fatigue Eyes: Notes double vision.  No eye pain Ear, nose and throat: No hearing loss, ear pain, nasal congestion, sore throat Cardiovascular: No chest pain, palpitations Respiratory: No shortness of breath at rest or with exertion.   No wheezes.  Occ coughs (smokes) GastrointestinaI: No nausea, vomiting, diarrhea, abdominal pain, fecal incontinence Genitourinary: No dysuria, urinary retention.  Mild frequency.  No nocturia. Musculoskeletal: No neck pain, mild back pain Integumentary: No rash, pruritus, skin lesions Neurological: as above Psychiatric: Notes depression and anxiety.   Been diagnosed with bipolar disease.  He currently does not have a psychiatrist. Endocrine: No palpitations, diaphoresis, change in appetite, change in weigh or increased thirst Hematologic/Lymphatic: No anemia, purpura, petechiae. Allergic/Immunologic: No itchy/runny eyes, nasal congestion, recent allergic reactions, rashes  ALLERGIES: No Known Allergies  HOME MEDICATIONS:  Current Outpatient Medications:  .  Dimethyl Fumarate (TECFIDERA) 240 MG CPDR, Take 1 capsule (240 mg total) by mouth 2 (two) times daily., Disp: 180 capsule, Rfl: 3 .  folic acid (FOLVITE) 1 MG tablet, Take 1 tablet (1 mg total) by mouth daily., Disp: , Rfl:  .  Multiple Vitamin (MULTIVITAMIN WITH MINERALS) TABS tablet, Take 1 tablet by mouth daily., Disp: , Rfl:  .  OLANZapine (ZYPREXA) 10 MG tablet, Take 1 tablet (10 mg total) by mouth at bedtime., Disp: , Rfl:   PAST MEDICAL HISTORY: Past Medical History:  Diagnosis Date  . Depression   . Mental disorder   . Movement disorder   . MS (multiple sclerosis) (HCC)   . Multiple sclerosis, relapsing-remitting (HCC)   . Neuromuscular disorder (HCC)   . Vision abnormalities     PAST SURGICAL HISTORY: Past Surgical History:  Procedure Laterality Date  . BIOPSY  10/17/2017   Procedure: BIOPSY;  Surgeon:  Kathi Der, MD;  Location: [facility];  Service: Gastroenterology;;  . ESOPHAGOGASTRODUODENOSCOPY (EGD) WITH PROPOFOL N/A 10/17/2017   Procedure: ESOPHAGOGASTRODUODENOSCOPY (EGD) WITH PROPOFOL;  Surgeon: Kathi Der, MD;  Location: [facility];  Service: Gastroenterology;  Laterality: N/A;  . IR ANGIOGRAM SELECTIVE EACH ADDITIONAL VESSEL  10/21/2017  . IR ANGIOGRAM SELECTIVE EACH ADDITIONAL VESSEL  10/21/2017  . IR ANGIOGRAM VISCERAL SELECTIVE  10/21/2017  . IR EMBO ART  VEN HEMORR LYMPH EXTRAV  INC GUIDE ROADMAPPING  10/21/2017  . IR US GUIDE VASC ACCESS RIGHT  10/21/2017  . TONSILLECTOMY      FAMILY HISTORY: Family History  Problem Relation Age of Onset  . Heart disease Mother   . Stroke Father     SOCIAL HISTORY:  Social History   Socioeconomic History  . Marital status: Legally Separated    Spouse name: Not on file  . Number of children: Not on file  . Years of education: Not on file  . Highest education level: Not on file  Occupational History  . Not on file  Social Needs  . Financial resource strain: Not on file  . Food insecurity:    Worry: Not on file    Inability: Not on file  . Transportation needs:    Medical: Not on file    Non-medical: Not on file  Tobacco Use  . Smoking status: Current Every Day Smoker    Packs/day: 3.00    Years: 36.00    Pack years: 108.00    Types: Cigarettes  . Smokeless tobacco: Never Used  Substance and Sexual Activity  . Alcohol use: Yes    Alcohol/week: 0.0 standard drinks    Comment: 11 beers a day x "many years"  . Drug use: Yes    Types: Marijuana  Comment: 1-2 times a month  . Sexual activity: Not Currently    Birth control/protection: None  Lifestyle  . Physical activity:    Days per week: Not on file    Minutes per session: Not on file  . Stress: Not on file  Relationships  . Social connections:    Talks on phone: Not on file    Gets together: Not on file    Attends religious service: Not on file     Active member of club or organization: Not on file    Attends meetings of clubs or organizations: Not on file    Relationship status: Not on file  . Intimate partner violence:    Fear of current or ex partner: Not on file    Emotionally abused: Not on file    Physically abused: Not on file    Forced sexual activity: Not on file  Other Topics Concern  . Not on file  Social History Narrative   ** Merged History Encounter **         PHYSICAL EXAM  Vitals:   03/17/18 0856  BP: 115/75  Pulse: 99  Weight: 179 lb 8 oz (81.4 kg)  Height: 5\' 11"  (1.803 m)    Body mass index is 25.04 kg/m.   General: The patient is well-developed and well-nourished and in no acute distress  Musculoskeletal: He has mild tenderness over the lumbar paraspinal region.  Neurologic Exam  Mental status: The patient is alert and oriented x 3 at the time of the examination.  The patient has apparent normal recent and remote memory, with an apparently normal attention span and concentration ability. The speech is pretty normal today per   Cranial nerves: Extraocular muscles appear to be intact. Facial strength and sensation was normal.     Trapezius and sternocleidomastoid strength is normal. No dysarthria is noted.    No obvious hearing deficits are noted.  Motor: Muscle bulk is normal.  Muscle tone is increased in the legs.  Strength is  5 / 5 in all 4 extremities except 4+/5 in hip flexors   Sensory: Sensory testing is intact to soft touch and vibration sensation in all 4 extremities.    Coordination: He has reasonably adequate finger-nose-finger and reduced heel-to-shin.  Gait and station: Station is wide.  He has a shuffling stride that improves in more wide open space with a cane.  Romberg is negative.   Reflexes: Deep tendon reflexes are symmetric and 3+ bilaterally.    DTRs are increased with sread at the knees,     DIAGNOSTIC DATA (LABS, IMAGING, TESTING) - I reviewed patient records, labs,  notes, testing and imaging myself where available.  Lab Results  Component Value Date   WBC 3.6 (L) 01/02/2018   HGB 13.1 01/02/2018   HCT 40.5 01/02/2018   MCV 94.0 01/02/2018   PLT 231 01/02/2018      Component Value Date/Time   NA 146 (H) 01/02/2018 2058   NA 146 (H) 07/15/2015 1134   K 4.3 01/02/2018 2058   CL 111 01/02/2018 2058   CO2 24 01/02/2018 2058   GLUCOSE 81 01/02/2018 2058   BUN 14 01/02/2018 2058   BUN 10 07/15/2015 1134   CREATININE 0.93 01/02/2018 2058   CALCIUM 9.1 01/02/2018 2058   PROT 7.2 01/02/2018 2058   PROT 6.6 07/15/2015 1134   ALBUMIN 4.0 01/02/2018 2058   ALBUMIN 4.0 07/15/2015 1134   AST 19 01/02/2018 2058   ALT 13 01/02/2018 2058  ALKPHOS 51 01/02/2018 2058   BILITOT 0.3 01/02/2018 2058   BILITOT 0.3 07/15/2015 1134   GFRNONAA >60 01/02/2018 2058   GFRAA >60 01/02/2018 2058    Lab Results  Component Value Date   VITAMINB12 180 10/17/2017   Lab Results  Component Value Date   TSH 7.010 (H) 04/05/2016     ASSESSMENT AND PLAN  MS (multiple sclerosis) (HCC) - Plan: CBC with Differential/Platelet  Gait disturbance  Urinary urgency  High risk medication use - Plan: CBC with Differential/Platelet  Other fatigue  Bipolar I disorder (HCC)   1.  Continue Tecfidera.  We will check a CBC with differential to make sure that there is not severe lymphopenia.  His MRIs were stable.   2.  Stay active and exercise as tolerated.  We discussed trying to quit smoking. 3.   He is advised to follow-up with psychiatry/Behavioral Health.    4.   He will return to see me in 6 months or sooner if he has new or worsening neurologic symptoms.   Richard A. Epimenio Foot, MD, PhD 03/17/2018, 9:38 AM Certified in Neurology, Clinical Neurophysiology, Sleep Medicine, Pain Medicine and Neuroimaging  [facility] [address] [place], [place] [address] [phone]

## 2018-03-18 ENCOUNTER — Other Ambulatory Visit: Payer: Self-pay | Admitting: Neurology

## 2018-03-18 DIAGNOSIS — G35 Multiple sclerosis: Secondary | ICD-10-CM

## 2018-03-18 LAB — CBC WITH DIFFERENTIAL/PLATELET
Basophils Absolute: 0 10*3/uL (ref 0.0–0.2)
Basos: 1 %
EOS (ABSOLUTE): 0.1 10*3/uL (ref 0.0–0.4)
Eos: 2 %
Hematocrit: 39.8 % (ref 37.5–51.0)
Hemoglobin: 13.6 g/dL (ref 13.0–17.7)
Immature Grans (Abs): 0 10*3/uL (ref 0.0–0.1)
Immature Granulocytes: 0 %
Lymphocytes Absolute: 0.5 10*3/uL — ABNORMAL LOW (ref 0.7–3.1)
Lymphs: 9 %
MCH: 29.6 pg (ref 26.6–33.0)
MCHC: 34.2 g/dL (ref 31.5–35.7)
MCV: 87 fL (ref 79–97)
Monocytes Absolute: 0.6 10*3/uL (ref 0.1–0.9)
Monocytes: 11 %
Neutrophils Absolute: 4.5 10*3/uL (ref 1.4–7.0)
Neutrophils: 77 %
Platelets: 196 10*3/uL (ref 150–450)
RBC: 4.59 x10E6/uL (ref 4.14–5.80)
RDW: 14.4 % (ref 12.3–15.4)
WBC: 5.8 10*3/uL (ref 3.4–10.8)

## 2018-03-19 ENCOUNTER — Telehealth: Payer: Self-pay | Admitting: *Deleted

## 2018-03-19 NOTE — Telephone Encounter (Signed)
Called and spoke with pt about lab results per Dr. Epimenio Foot note. He will come back in a couple months around 05/19/18 for repeat lab work. He verbalized understanding.

## 2018-03-19 NOTE — Telephone Encounter (Signed)
-----   Message from Asa Lente, MD sent at 03/18/2018  7:07 PM EST ----- Please let him know that the lymphocyte count was a little lower than we would like it to be.  I would like to check this again in 2 months.  I will place the order in epic.

## 2018-04-03 ENCOUNTER — Telehealth: Payer: Self-pay | Admitting: Neurology

## 2018-04-03 MED ORDER — DIMETHYL FUMARATE 240 MG PO CPDR: 240.0000 mg | DELAYED_RELEASE_CAPSULE | Freq: Two times a day (BID) | ORAL | 11 refills | Status: DC

## 2018-04-03 NOTE — Addendum Note (Signed)
Addended by: Candis Schatz I on: 04/03/2018 02:41 PM   Modules accepted: Orders

## 2018-04-03 NOTE — Telephone Encounter (Signed)
Pt states the grant ran out on the previous program for his Dimethyl Fumarate (TECFIDERA) 240 MG CPDR as of 03-30-18.  The new pharmacy needs a prescription sent to them by fax#[phone](pt does not know the name of the pharmacy)

## 2018-04-03 NOTE — Telephone Encounter (Signed)
Tecfidera rx. printed and faxed as requested/fim

## 2018-04-24 DIAGNOSIS — G35 Multiple sclerosis: Secondary | ICD-10-CM | POA: Diagnosis not present

## 2018-04-24 DIAGNOSIS — F17211 Nicotine dependence, cigarettes, in remission: Secondary | ICD-10-CM | POA: Diagnosis not present

## 2018-04-24 DIAGNOSIS — R4586 Emotional lability: Secondary | ICD-10-CM | POA: Diagnosis not present

## 2018-04-24 DIAGNOSIS — Z720 Tobacco use: Secondary | ICD-10-CM | POA: Diagnosis not present

## 2018-04-24 DIAGNOSIS — F17200 Nicotine dependence, unspecified, uncomplicated: Secondary | ICD-10-CM | POA: Diagnosis not present

## 2018-04-24 DIAGNOSIS — F1020 Alcohol dependence, uncomplicated: Secondary | ICD-10-CM | POA: Diagnosis not present

## 2018-04-24 DIAGNOSIS — F329 Major depressive disorder, single episode, unspecified: Secondary | ICD-10-CM | POA: Diagnosis not present

## 2018-05-12 ENCOUNTER — Emergency Department (HOSPITAL_COMMUNITY)
Admission: EM | Admit: 2018-05-12 | Discharge: 2018-05-13 | Disposition: A | Discharge status: Home / Self Care | Payer: Medicare HMO | Attending: Emergency Medicine | Admitting: Emergency Medicine

## 2018-05-12 ENCOUNTER — Encounter (HOSPITAL_COMMUNITY): Payer: Self-pay

## 2018-05-12 ENCOUNTER — Other Ambulatory Visit: Payer: Self-pay

## 2018-05-12 DIAGNOSIS — F1010 Alcohol abuse, uncomplicated: Secondary | ICD-10-CM | POA: Diagnosis not present

## 2018-05-12 DIAGNOSIS — F332 Major depressive disorder, recurrent severe without psychotic features: Secondary | ICD-10-CM | POA: Diagnosis not present

## 2018-05-12 DIAGNOSIS — G35 Multiple sclerosis: Secondary | ICD-10-CM | POA: Insufficient documentation

## 2018-05-12 DIAGNOSIS — F17210 Nicotine dependence, cigarettes, uncomplicated: Secondary | ICD-10-CM | POA: Insufficient documentation

## 2018-05-12 DIAGNOSIS — F99 Mental disorder, not otherwise specified: Secondary | ICD-10-CM | POA: Diagnosis present

## 2018-05-12 DIAGNOSIS — Z79899 Other long term (current) drug therapy: Secondary | ICD-10-CM | POA: Insufficient documentation

## 2018-05-12 DIAGNOSIS — R45851 Suicidal ideations: Secondary | ICD-10-CM | POA: Diagnosis not present

## 2018-05-12 DIAGNOSIS — F319 Bipolar disorder, unspecified: Secondary | ICD-10-CM | POA: Diagnosis present

## 2018-05-12 LAB — CBC
HCT: 45.3 % (ref 39.0–52.0)
Hemoglobin: 14.6 g/dL (ref 13.0–17.0)
MCH: 32.2 pg (ref 26.0–34.0)
MCHC: 32.2 g/dL (ref 30.0–36.0)
MCV: 99.8 fL (ref 80.0–100.0)
Platelets: 199 10*3/uL (ref 150–400)
RBC: 4.54 MIL/uL (ref 4.22–5.81)
RDW: 15.1 % (ref 11.5–15.5)
WBC: 4.6 10*3/uL (ref 4.0–10.5)
nRBC: 0 % (ref 0.0–0.2)

## 2018-05-12 LAB — COMPREHENSIVE METABOLIC PANEL
ALT: 12 U/L (ref 0–44)
AST: 18 U/L (ref 15–41)
Albumin: 4.3 g/dL (ref 3.5–5.0)
Alkaline Phosphatase: 47 U/L (ref 38–126)
Anion gap: 13 (ref 5–15)
BUN: 16 mg/dL (ref 8–23)
CO2: 18 mmol/L — ABNORMAL LOW (ref 22–32)
Calcium: 9 mg/dL (ref 8.9–10.3)
Chloride: 109 mmol/L (ref 98–111)
Creatinine, Ser: 0.98 mg/dL (ref 0.61–1.24)
GFR calc Af Amer: >60 mL/min (ref >60)
GFR calc non Af Amer: >60 mL/min (ref >60)
Glucose, Bld: 70 mg/dL (ref 70–99)
Potassium: 4 mmol/L (ref 3.5–5.1)
Sodium: 140 mmol/L (ref 135–145)
Total Bilirubin: 0.8 mg/dL (ref 0.3–1.2)
Total Protein: 7.6 g/dL (ref 6.5–8.1)

## 2018-05-12 LAB — SALICYLATE LEVEL: Salicylate Lvl: <7 mg/dL (ref 2.8–30.0)

## 2018-05-12 LAB — ETHANOL: Alcohol, Ethyl (B): 57 mg/dL — ABNORMAL HIGH (ref <10)

## 2018-05-12 LAB — ACETAMINOPHEN LEVEL: Acetaminophen (Tylenol), Serum: <10 ug/mL — ABNORMAL LOW (ref 10–30)

## 2018-05-12 MED ORDER — LORAZEPAM 2 MG/ML IJ SOLN: 0.0000 mg | Freq: Four times a day (QID) | INTRAMUSCULAR | Status: DC

## 2018-05-12 MED ORDER — DIMETHYL FUMARATE 240 MG PO CPDR: 240.0000 mg | DELAYED_RELEASE_CAPSULE | Freq: Two times a day (BID) | ORAL | Status: DC

## 2018-05-12 MED ORDER — FOLIC ACID 1 MG PO TABS
1.0000 mg | ORAL_TABLET | Freq: Every day | ORAL | Status: DC
  Administered 2018-05-13: 1 mg via ORAL
  Filled 2018-05-12: qty 1

## 2018-05-12 MED ORDER — LORAZEPAM 2 MG/ML IJ SOLN: 0.0000 mg | Freq: Two times a day (BID) | INTRAMUSCULAR | Status: DC

## 2018-05-12 MED ORDER — OLANZAPINE 10 MG PO TABS
10.0000 mg | ORAL_TABLET | Freq: Every day | ORAL | Status: DC
  Administered 2018-05-13: 10 mg via ORAL
  Filled 2018-05-12: qty 1

## 2018-05-12 MED ORDER — LORAZEPAM 1 MG PO TABS: 0.0000 mg | ORAL_TABLET | Freq: Two times a day (BID) | ORAL | Status: DC

## 2018-05-12 MED ORDER — VITAMIN B-1 100 MG PO TABS
100.0000 mg | ORAL_TABLET | Freq: Every day | ORAL | Status: DC
  Administered 2018-05-13: 100 mg via ORAL
  Filled 2018-05-12: qty 1

## 2018-05-12 MED ORDER — ADULT MULTIVITAMIN W/MINERALS CH
1.0000 | ORAL_TABLET | Freq: Every day | ORAL | Status: DC
  Administered 2018-05-13: 1 via ORAL
  Filled 2018-05-12: qty 1

## 2018-05-12 MED ORDER — THIAMINE HCL 100 MG/ML IJ SOLN: 100.0000 mg | Freq: Every day | INTRAMUSCULAR | Status: DC

## 2018-05-12 MED ORDER — LORAZEPAM 1 MG PO TABS
0.0000 mg | ORAL_TABLET | Freq: Four times a day (QID) | ORAL | Status: DC
  Administered 2018-05-13: 1 mg via ORAL
  Filled 2018-05-12: qty 1

## 2018-05-12 NOTE — ED Triage Notes (Signed)
Pt called mobile crisis at 6p. He reports SI with a plan to take all his meds and jump off a bridge.   Crisis center also requesting that he be given resources and started in a day program after in patient treatment if applicable.

## 2018-05-12 NOTE — ED Notes (Signed)
Bed: [facility] Expected date:  Expected time:  Means of arrival:  Comments: 
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
## 2018-05-12 NOTE — ED Provider Notes (Signed)
[facility]-EMERGENCY DEPT Provider Note   CSN: [id number] Arrival date & time: 05/12/18  2145     History   Chief Complaint Chief Complaint  Patient presents with  . Suicidal    HPI [name] is a 63 y.o. male.  The history is provided by the patient and medical records.     63 y.o. M with hx of depression, MS, chronic visual changes, anemia, alcohol abuse, presenting to the ED for suicidal ideation.  States he has felt this way pretty much all day today.  He is not exactly sure why, nothing traumatic has happened lately.  States "just life I guess".  He does have a lot of medical problems including MS which weighs on his mind a lot.  He had plan to overdose on sleeping pills.  He denies HI/AVH.  No illicit drug use.  Does report drinking 3 25oz beers today.  He does drink some alcohol most days of the week.  No issues with his MS, reports that has been stable for about 2 years.  Ambulates with cane which is baseline.  Past Medical History:  Diagnosis Date  . Depression   . Mental disorder   . Movement disorder   . MS (multiple sclerosis) (HCC)   . Multiple sclerosis, relapsing-remitting (HCC)   . Neuromuscular disorder (HCC)   . Vision abnormalities     Patient Active Problem List   Diagnosis Date Noted  . Altered mental status   . Acute encephalopathy   . Goals of care, counseling/discussion   . Palliative care encounter   . GI bleed 10/18/2017  . Symptomatic anemia   . Upper GI bleeding 10/16/2017  . Coccygeal pain, acute 04/08/2017  . Elevated TSH 04/06/2016  . Bipolar I disorder (HCC) 04/05/2016  . 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 Numbness 01/16/2016  . Alcohol abuse 07/16/2015  . Other fatigue 06/29/2014  . Gait disturbance 06/29/2014  . Urinary urgency 06/29/2014  . High risk medication use 06/29/2014  . MS (multiple sclerosis) (HCC) 10/31/2011    Class: Chronic  . Dysthymia 05/11/2011    Past Surgical  History:  Procedure Laterality Date  . BIOPSY  10/17/2017   Procedure: BIOPSY;  Surgeon: Kathi Der, MD;  [facility];  Service: Gastroenterology;;  . ESOPHAGOGASTRODUODENOSCOPY (EGD) WITH PROPOFOL N/A 10/17/2017   Procedure: ESOPHAGOGASTRODUODENOSCOPY (EGD) WITH PROPOFOL;  Surgeon: Kathi Der, MD;  [facility];  Service: Gastroenterology;  Laterality: N/A;  . IR ANGIOGRAM SELECTIVE EACH ADDITIONAL VESSEL  10/21/2017  . IR ANGIOGRAM SELECTIVE EACH ADDITIONAL VESSEL  10/21/2017  . IR ANGIOGRAM VISCERAL SELECTIVE  10/21/2017  . IR EMBO ART  VEN HEMORR LYMPH EXTRAV  INC GUIDE ROADMAPPING  10/21/2017  . IR US GUIDE VASC ACCESS RIGHT  10/21/2017  . TONSILLECTOMY          Home Medications    Prior to Admission medications   Medication Sig Start Date End Date Taking? Authorizing Provider  Dimethyl Fumarate (TECFIDERA) 240 MG CPDR Take 1 capsule (240 mg total) by mouth 2 (two) times daily. 04/03/18   Sater, Pearletha Furl, MD  folic acid (FOLVITE) 1 MG tablet Take 1 tablet (1 mg total) by mouth daily. 11/12/17   Elgergawy, Leana Roe, MD  Multiple Vitamin (MULTIVITAMIN WITH MINERALS) TABS tablet Take 1 tablet by mouth daily. 11/12/17   Elgergawy, Leana Roe, MD  OLANZapine (ZYPREXA) 10 MG tablet Take 1 tablet (10 mg total) by mouth at bedtime. 11/11/17   Elgergawy,  Leana Roe, MD    Family History Family History  Problem Relation Age of Onset  . Heart disease Mother   . Stroke Father     Social History Social History   Tobacco Use  . Smoking status: Current Every Day Smoker    Packs/day: 3.00    Years: 36.00    Pack years: 108.00    Types: Cigarettes  . Smokeless tobacco: Never Used  Substance Use Topics  . Alcohol use: Yes    Alcohol/week: 0.0 standard drinks    Comment: 11 beers a day x "many years"  . Drug use: Yes    Types: Marijuana    Comment: 1-2 times a month     Allergies   Patient has no known allergies.   Review of Systems Review of Systems    Psychiatric/Behavioral: Positive for suicidal ideas.  All other systems reviewed and are negative.    Physical Exam Updated Vital Signs BP (!) 144/81 (BP Location: Left Arm)   Pulse 91   Temp 97.9 F (36.6 C) (Oral)   Resp 17   SpO2 100%   Physical Exam Vitals signs and nursing note reviewed.  Constitutional:      Appearance: He is well-developed.  HENT:     Head: Normocephalic and atraumatic.  Eyes:     Conjunctiva/sclera: Conjunctivae normal.     Pupils: Pupils are equal, round, and reactive to light.     Comments: Sensitive to light, sunglasses on (daily wear)  Neck:     Musculoskeletal: Normal range of motion.  Cardiovascular:     Rate and Rhythm: Normal rate and regular rhythm.     Heart sounds: Normal heart sounds.  Pulmonary:     Effort: Pulmonary effort is normal.     Breath sounds: Normal breath sounds.  Abdominal:     General: Bowel sounds are normal.     Palpations: Abdomen is soft.  Musculoskeletal: Normal range of motion.  Skin:    General: Skin is warm and dry.  Neurological:     Mental Status: He is alert and oriented to person, place, and time.  Psychiatric:        Attention and Perception: He does not perceive auditory hallucinations.        Thought Content: Thought content includes suicidal ideation. Thought content does not include homicidal ideation. Thought content includes suicidal plan. Thought content does not include homicidal plan.      ED Treatments / Results  Labs (all labs ordered are listed, but only abnormal results are displayed) Labs Reviewed  COMPREHENSIVE METABOLIC PANEL - Abnormal; Notable for the following components:      Result Value   CO2 18 (*)    All other components within normal limits  ETHANOL - Abnormal; Notable for the following components:   Alcohol, Ethyl (B) 57 (*)    All other components within normal limits  ACETAMINOPHEN LEVEL - Abnormal; Notable for the following components:   Acetaminophen (Tylenol),  Serum <10 (*)    All other components within normal limits  SALICYLATE LEVEL  CBC  RAPID URINE DRUG SCREEN, HOSP PERFORMED    EKG None  Radiology No results found.  Procedures Procedures (including critical care time)  Medications Ordered in ED Medications - No data to display   Initial Impression / Assessment and Plan / ED Course  I have reviewed the triage vital signs and the nursing notes.  Pertinent labs & imaging results that were available during my care of the patient were  reviewed by me and considered in my medical decision making (see chart for details).  63 year old male here for suicidal ideation with plan to overdose on pills.  States nothing is happened lately it is "just life".  He does have a lot of chronic medical problems including MS.  He denies any recent flares, but reports this diagnosis does weigh heavily on his mind.  He drank 3 25 ounce beers today, he drinks alcohol most days of the week.  He denies any homicidal ideation.  No hallucinations.  Screening labs are overall reassuring, ethanol 57.  Patient medically cleared.  Placed on CIWA protocol given prolonged alcohol use.  Medically cleared.  TTS has evaluated and recommends overnight observation with reassessment in the morning.  Home meds ordered.    Final Clinical Impressions(s) / ED Diagnoses   Final diagnoses:  Suicidal ideation    ED Discharge Orders    None       Garlon Hatchet, PA-C 05/12/18 2334    Geoffery Lyons, MD 05/13/18 2248

## 2018-05-12 NOTE — ED Notes (Signed)
Pt walks with a cane.  

## 2018-05-13 LAB — RAPID URINE DRUG SCREEN, HOSP PERFORMED
Amphetamines: NOT DETECTED
Barbiturates: NOT DETECTED
Benzodiazepines: NOT DETECTED
Cocaine: NOT DETECTED
Opiates: NOT DETECTED
Tetrahydrocannabinol: POSITIVE — AB

## 2018-05-13 NOTE — ED Notes (Signed)
Patient calm, cooperative, pleasant.

## 2018-05-13 NOTE — ED Notes (Signed)
Bed: XB93 Expected date:  Expected time:  Means of arrival:  Comments: Hold for room 16

## 2018-05-13 NOTE — Discharge Instructions (Signed)
For your behavioral health needs, you are advised to follow up with your primary care provider.

## 2018-05-13 NOTE — ED Notes (Signed)
One bag of pts belongings labeled and placed behind nursing station, including cane

## 2018-05-13 NOTE — BH Assessment (Signed)
[facility] Assessment Progress Note  Per Juanetta Beets, DO this pt does not require psychiatric hospitalization at this time.  Pt is to be discharged from [facility] with recommendation to follow up with his primary care provider.  This has been included in pt's discharge instructions.  Pt's nurse, Gabriel Earing, has been notified.  [name], MA Triage Specialist [phone]

## 2018-05-13 NOTE — Progress Notes (Signed)
CSW consulted for assistance with transportation. Per notes, patient has MS and has difficulty walking long distances. CSW provided patient with a taxi voucher back to his home. No further CSW needs at this time. Please reconsult if needs arise.  Archie Balboa, LCSWA  Clinical Social Work Department  [employer]  [phone]

## 2018-05-13 NOTE — ED Notes (Signed)
Pt given fresh cup of water and crackers.

## 2018-05-13 NOTE — ED Notes (Signed)
Pt spilled urinal, changed into new set of burgundy scrubs and linens changed.

## 2018-05-13 NOTE — BHH Suicide Risk Assessment (Cosign Needed)
Suicide Risk Assessment  Discharge Assessment   [facility] Discharge Suicide Risk Assessment   Principal Problem: Bipolar I disorder [facility]) Discharge Diagnoses: Principal Problem:   Bipolar I disorder (HCC)   Total Time spent with patient: 30 minutes  Musculoskeletal: Strength & Muscle Tone: within normal limits Gait & Station: normal Patient leans: N/A  Psychiatric Specialty Exam:   Blood pressure (!) 143/57, pulse (!) 59, temperature 97.9 F (36.6 C), temperature source Oral, resp. rate 18, SpO2 96 %.There is no height or weight on file to calculate BMI.  General Appearance: Fairly Groomed  Patent attorney::  Fair  Speech:  Clear and Coherent and Normal Rate409  Volume:  Normal  Mood:  better  Affect:  Congruent  Thought Process:  Coherent, Linear and Descriptions of Associations: Intact  Orientation:  Full (Time, Place, and Person)  Thought Content:  Logical  Suicidal Thoughts:  No  Homicidal Thoughts:  No  Memory:  Immediate;   Fair Recent;   Fair  Judgement:  Fair  Insight:  Present  Psychomotor Activity:  Psychomotor Retardation  Concentration:  Fair  Recall:  [employer] of Knowledge:Fair  Language: Fair  Akathisia:  No  Handed:  Right  AIMS (if indicated):     Assets:  Communication Skills Desire for Improvement Financial Resources/Insurance Housing Social Support  Sleep:     Cognition: WNL  ADL's:  Intact   Mental Status Per Nursing Assessment::   On Admission:   63 year old divorced male who lives in an apartment with a roommate. He reports a chronic history of MS, and presents with depressive symptoms. He reports increase in drinking alcohol which may have contributed to his worsening suicidal ideations over the course of 1-2 days. He is not forthcoming with his psychiatric history however reports he sees Dr. Natalia Leatherwood for psych management, last suicide attempt was in 2018, and he has had two inpatient admissions to [facility]. He reports is family history is limited,  although he has a sister who needed help however declined mental services. He denies any legal charges, drug use, suicidal ideation and or homicidal ideations, hallucinations at this time.   Demographic Factors:  Male, Caucasian and Low socioeconomic status  Loss Factors: Decrease in vocational status  Historical Factors: NA  Risk Reduction Factors:   Sense of responsibility to family, Living with another person, especially a relative, Positive social support, Positive therapeutic relationship and Positive coping skills or problem solving skills  Continued Clinical Symptoms:  Bipolar Disorder:   Depressive phase Chronic Pain  Cognitive Features That Contribute To Risk:  None    Suicide Risk:  Minimal: No identifiable suicidal ideation.  Patients presenting with no risk factors but with morbid ruminations; may be classified as minimal risk based on the severity of the depressive symptoms    Plan Of Care/Follow-up recommendations:  Activity:  Increase activity as tolerated Diet:  Routine diet Other:  Even if you begin to feel better continue taking your medications.   Maryagnes Amos, FNP 05/13/2018, 11:46 AM

## 2018-05-28 ENCOUNTER — Telehealth: Payer: Self-pay | Admitting: *Deleted

## 2018-05-28 ENCOUNTER — Encounter: Payer: Self-pay | Admitting: *Deleted

## 2018-05-28 NOTE — Telephone Encounter (Signed)
I tried calling the following numbers: [phone], [phone], [phone]. Those were listed in pt chart/DPR. Unable to reach pt.

## 2018-05-28 NOTE — Telephone Encounter (Signed)
I sent patient letter since unable to reach by phone.

## 2018-05-28 NOTE — Telephone Encounter (Signed)
-----   Message from Asa Lente, MD sent at 05/27/2018  6:50 PM EST ----- Regarding: RE: repeat labs Looks like they only did the CBC not the differential at recent hospitalization.   We will need to recheck the CBC with differential sometime this month.  ----- Message ----- From: Hillis Range, RN Sent: 05/19/2018   1:50 PM EST To: Asa Lente, MD Subject: FW: repeat labs                                Dr. Epimenio Foot- pt  [name] DOBJun 28, 1957 was recently in the hospital and had repeat CBC. His lymphocyte count was still low. Do you want to do anything?  Farrell Pantaleo ----- Message ----- From: Hillis Range, RN Sent: 03/19/2018   8:48 AM EST To: Hillis Range, RN Subject: repeat labs                                    Needs repeat CBC w/ diff around 05/19/18

## 2018-06-04 ENCOUNTER — Other Ambulatory Visit (INDEPENDENT_AMBULATORY_CARE_PROVIDER_SITE_OTHER): Payer: Medicare HMO

## 2018-06-04 DIAGNOSIS — G35 Multiple sclerosis: Secondary | ICD-10-CM | POA: Diagnosis not present

## 2018-06-04 DIAGNOSIS — Z0289 Encounter for other administrative examinations: Secondary | ICD-10-CM

## 2018-06-05 ENCOUNTER — Telehealth: Payer: Self-pay | Admitting: *Deleted

## 2018-06-05 LAB — CBC WITH DIFFERENTIAL/PLATELET
Basophils Absolute: 0 10*3/uL (ref 0.0–0.2)
Basos: 1 %
EOS (ABSOLUTE): 0.1 10*3/uL (ref 0.0–0.4)
Eos: 2 %
Hematocrit: 43.4 % (ref 37.5–51.0)
Hemoglobin: 14.7 g/dL (ref 13.0–17.7)
Immature Grans (Abs): 0 10*3/uL (ref 0.0–0.1)
Immature Granulocytes: 0 %
Lymphocytes Absolute: 0.8 10*3/uL (ref 0.7–3.1)
Lymphs: 23 %
MCH: 31.6 pg (ref 26.6–33.0)
MCHC: 33.9 g/dL (ref 31.5–35.7)
MCV: 93 fL (ref 79–97)
Monocytes Absolute: 0.4 10*3/uL (ref 0.1–0.9)
Monocytes: 11 %
Neutrophils Absolute: 2.2 10*3/uL (ref 1.4–7.0)
Neutrophils: 63 %
Platelets: 161 10*3/uL (ref 150–450)
RBC: 4.65 x10E6/uL (ref 4.14–5.80)
RDW: 13.6 % (ref 11.6–15.4)
WBC: 3.5 10*3/uL (ref 3.4–10.8)

## 2018-06-05 NOTE — Telephone Encounter (Signed)
-----   Message from Asa Lente, MD sent at 06/05/2018  1:15 PM EST ----- Please let the patient know that the lab work is better.    Lymphocytes are now back in normal range

## 2018-06-05 NOTE — Telephone Encounter (Signed)
Called and spoke with pt about lab results per Dr. Sater. He verbalized understanding.  

## 2018-08-04 DIAGNOSIS — G35 Multiple sclerosis: Secondary | ICD-10-CM | POA: Diagnosis not present

## 2018-08-04 DIAGNOSIS — F329 Major depressive disorder, single episode, unspecified: Secondary | ICD-10-CM | POA: Diagnosis not present

## 2018-08-04 DIAGNOSIS — F1020 Alcohol dependence, uncomplicated: Secondary | ICD-10-CM | POA: Diagnosis not present

## 2018-08-07 DIAGNOSIS — R4189 Other symptoms and signs involving cognitive functions and awareness: Secondary | ICD-10-CM | POA: Diagnosis not present

## 2018-08-07 DIAGNOSIS — R410 Disorientation, unspecified: Secondary | ICD-10-CM | POA: Diagnosis not present

## 2018-08-14 ENCOUNTER — Telehealth: Payer: Self-pay | Admitting: Neurology

## 2018-08-14 NOTE — Telephone Encounter (Signed)
Pharmacy called in for a refill of Dimethyl Fumarate (TECFIDERA) 240 MG CPDR to be faxed to [phone]

## 2018-08-14 NOTE — Telephone Encounter (Signed)
I called [facility] to discuss. A year's supply was sent in December of 2019 to [facility].   No answer, will try again later.

## 2018-08-18 MED ORDER — DIMETHYL FUMARATE 240 MG PO CPDR: 240.0000 mg | DELAYED_RELEASE_CAPSULE | Freq: Two times a day (BID) | ORAL | 11 refills | Status: DC

## 2018-08-18 NOTE — Telephone Encounter (Signed)
Refill of tecfidera sent to [facility].

## 2018-08-19 MED ORDER — DIMETHYL FUMARATE 240 MG PO CPDR: 240.0000 mg | DELAYED_RELEASE_CAPSULE | Freq: Two times a day (BID) | ORAL | 11 refills | Status: DC

## 2018-08-19 NOTE — Addendum Note (Signed)
Addended by: Eilene Ghazi L on: 08/19/2018 11:10 AM   Modules accepted: Orders

## 2018-08-19 NOTE — Telephone Encounter (Signed)
Took call from phone staff. Spoke with Grover Canavan from [facility]. Tecfidera sent to incorrect pharmacy yesterday. I e-scribed to [facility] on file. They will call back if they do not receive. Nothing further needed.

## 2018-09-01 DIAGNOSIS — F329 Major depressive disorder, single episode, unspecified: Secondary | ICD-10-CM | POA: Diagnosis not present

## 2018-09-01 DIAGNOSIS — F1020 Alcohol dependence, uncomplicated: Secondary | ICD-10-CM | POA: Diagnosis not present

## 2018-09-01 DIAGNOSIS — G35 Multiple sclerosis: Secondary | ICD-10-CM | POA: Diagnosis not present

## 2018-09-10 ENCOUNTER — Telehealth: Payer: Self-pay | Admitting: *Deleted

## 2018-09-10 NOTE — Telephone Encounter (Signed)
Tried calling pt, VM not set up on [phone] could not leave message ELK 09/10/18 3:59pm  I called his number again. Was able to speak with him. He is unable to do VV (does not have smart phone or computer with camera). He would like to do in office visit. He denies any signs/sx of covid-19 or being exposed to anyone with covid-19. I updated medication list/pharmacy/allergies on file.

## 2018-09-16 ENCOUNTER — Other Ambulatory Visit: Payer: Self-pay

## 2018-09-16 ENCOUNTER — Ambulatory Visit (INDEPENDENT_AMBULATORY_CARE_PROVIDER_SITE_OTHER): Payer: Medicare HMO | Admitting: Neurology

## 2018-09-16 ENCOUNTER — Encounter: Payer: Self-pay | Admitting: Neurology

## 2018-09-16 VITALS — BP 129/85 | HR 100 | Temp 98.2°F | Ht 71.0 in

## 2018-09-16 DIAGNOSIS — G35 Multiple sclerosis: Secondary | ICD-10-CM

## 2018-09-16 DIAGNOSIS — E559 Vitamin D deficiency, unspecified: Secondary | ICD-10-CM | POA: Diagnosis not present

## 2018-09-16 DIAGNOSIS — R3915 Urgency of urination: Secondary | ICD-10-CM | POA: Diagnosis not present

## 2018-09-16 DIAGNOSIS — R269 Unspecified abnormalities of gait and mobility: Secondary | ICD-10-CM | POA: Diagnosis not present

## 2018-09-16 DIAGNOSIS — F319 Bipolar disorder, unspecified: Secondary | ICD-10-CM | POA: Diagnosis not present

## 2018-09-16 DIAGNOSIS — Z79899 Other long term (current) drug therapy: Secondary | ICD-10-CM

## 2018-09-16 MED ORDER — OLANZAPINE 10 MG PO TABS: 10.0000 mg | ORAL_TABLET | Freq: Every day | ORAL | 3 refills | Status: DC

## 2018-09-16 MED ORDER — FOLIC ACID 1 MG PO TABS: 1.0000 mg | ORAL_TABLET | Freq: Every day | ORAL | 3 refills | Status: DC

## 2018-09-16 MED ORDER — SERTRALINE HCL 50 MG PO TABS: 50.0000 mg | ORAL_TABLET | Freq: Every day | ORAL | 3 refills | Status: DC

## 2018-09-16 NOTE — Progress Notes (Signed)
[facility]  PATIENT: [name] DOB: Apr 23, 1956  REFERRING DOCTOR OR PCP: Dr.  Shana Chute SOURCE: patient and records from [facility]  _________________________________   HISTORICAL  CHIEF COMPLAINT:  Chief Complaint  Patient presents with  . Follow-up    RM 12, alone. Last seen 03/17/18. In the last several months pt feels his memory has worsened. Also more emotional. Gets emotional when watching TV more than typical for himself. He does report he feels depressed most of the time.  . Multiple Sclerosis    On Tecfidera  . Gait Problem    ambulates with cane. Reports no falls.     HISTORY OF PRESENT ILLNESS:  Elvira Brundrett is a 63 y.o. man with relapsing remitting multiple sclerosis and bipolar disease.   Update 09/16/2018: He feels the MS is stable but mood is doing worse.  He is on Tecfidera and tolerates it well.   No GI or flushing side effects.     He notes some stumbling but no falls.   He started wearing tennis shoes and stumbles more with them than in sandals.   Legs are symmetric in strength.    Mild dysesthesias in legs.   Bladder function is ok except some frequency and he has nocturia x 3-4 many nights.     Vision is doing well.  He notes some depression that feels similar to last year but he is weepy at times, especially when watching TV.   He is on Zyprexa and folic acid.    He notes mild cognitive issues and is forgetful.   Some word finding issues at times.   He doesn't sleep well many nights.    He notes fatigue during the days.     Update 03/17/2018: He feels he has been stable.     His MS is stable.   He has an MRI brain 11/02/2017.   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  It was stable compared to the 2017 MRI.  He is on Tecfidera and tolerates it well.   He has not had any exacerbations.     He uses a cane and can go a couple hundred feet without a stop but is very tired  afterwards.    His strength is doing ok and is symmetric.   He denies spasticity.      He denies any bladder issues.   No numbness or tingling.      He sleeps well most nights.    Mood is stable.     He is on Zyprexa and it has stablized mood issues.    He also sees Dr. Sharyn Lull (cardiology) but is planning on finding a PCP.    He still smokes about 3/4 pack a day.   He drinks 3 x 25 oz cans of beer many days.     Update 04/08/2017: Mr. Hucks continues on [place]. He is tolerating it well. He has not had any exacerbations while on it.   His last MRI showed no acute findings.     He notes his gait is off balanced.   He had to be extremely careful with the recent snow.  He shuffles but he has not had any falls  He has not noted more pain in the coccyx region. He denies any trauma or injury.   The onset was sudden (he just woke up with it).  Pain is midline.    Pain is  worse with sitting and especially when trying to stand.    He denies pain elsewhere.   No radiation into legs.   No new numbness or weakness.   No change in bladder.      He feels his voice is better and he has less stuttering. He still sometimes feels in a mental fog.[name].   He has bipolar disease.   He hasn't seen psychiatry recently due to high copay.     He stopped Abilify but is still on escitalopram, buproprion and lithium.     From 10/04/2016: He recently had a Psych admission at [facility] for depression ans suicidal ideation.    Abilify was added (has only been on a few days).  MS:   He has been on Tecfidera since late 2013 with a gap earlier this year when he ran out.   [name].He tolerates it well and has not had any definite exacerbation while on it. A recent lymphocyte count was good at 1.1 (09/23/16).    The MRI of the brain 07/2015 was stale.   It shows T2/FLAIR hyperintense foci in a pattern and configuration consistent with multiple sclerosis.   The MRI of the cervical spine shows some degenerative changes but no  MS plaques  Tremor:   He notes a rapid fine tremor in his hands that is not too bothersome.  Of note, lithium has recently been added but he did not have tremor the last time.    Tinnitus:   He notes a ringing tinnitus bilateral that has been present a while but seems worse.   Stutter:   He feels his stuttering speech is worse and "I can't half talk" .    notes a stuttering speech since being started on Lithium.    He finds this frustrating and also notes tha the has trouble coming up with the right words.     Gait/strength/sensation: He feels gait is worse.  He stumbles more.   He relies on his cane.   Balance is poor. No recent fall.   He notes mild weakness in both legs associated with some spasticity. He denies weakness or numbness in the arms..   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   Bladder:  He notes mild hesitancy but this is stable.   He also has some urgency, frequency.   No incontinence or UTI.  e has occasional constipation but had diarrhea 2 weeks ago. He has not had any urinary or fecal incontinence in the past few years.    Vision:   He denies visual blurring.[name].  He notes diplopia when he looks either left or right.   He denies any eye pain.  Fatigue/sleep: He reports mild fatigue, about the same as usual for him.   He does not have to take naps. Amantadine helped in the past but stopped helping so he stopped.[name]. He sleeps well most nights getting 7 hours or so.     Mood/cognition:    He has bipolar disease and has had multiple inpatient psychiatric admissions with various changes of medications over the years. He is on lithium and Abilify.    He has some depression but feels he is fairly stable.    MS History:   He presented with double vision in 2000.  MRI at that time showed lesions in the brain consistent with  multiple sclerosis. He  also had a lumbar puncture and the CSF was consistent with MS.   He was started on Avonex.  He had another episode of diplopia followed by weakness in both legs 3 years later.    He was switched to Copaxone for a while. Unfortunate, he had an another severe exacerbation with his legs this. He was started on Tysabri once a month.   For several years, he stayed to [place] but stopped as it was difficult for him to get the infusions. In 2011, he switched to [place]. 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 He had one exacerbation with weakness in 2012. He then started Tecfidera in late 2013. He has generally tolerated it well. He did not have any significant flushing or GI side effects. He has not had any definite exacerbations while on Tecfidera.      REVIEW OF SYSTEMS: Constitutional: No fevers, chills, sweats, or change in appetite.   He reports fatigue  eyes: Notes double vision.  No eye pain Ear, nose and throat: No hearing loss, ear pain, nasal congestion, sore throat Cardiovascular: No chest pain, palpitations Respiratory: No shortness of breath at rest or with exertion.   No wheezes.  Occ coughs (smokes) GastrointestinaI: No nausea, vomiting, diarrhea, abdominal pain, fecal incontinence Genitourinary: No dysuria, urinary retention.  Mild frequency.  No nocturia. Musculoskeletal: No neck pain, mild back pain Integumentary: No rash, pruritus, skin lesions Neurological: as above Psychiatric: He notes more depression.   Been diagnosed with bipolar disease.  He currently does not have a psychiatrist. Endocrine: No palpitations, diaphoresis, change in appetite, change in weigh or increased thirst Hematologic/Lymphatic: No anemia, purpura, petechiae. Allergic/Immunologic: No itchy/runny eyes, nasal congestion, recent allergic reactions, rashes  ALLERGIES: No Known Allergies  HOME MEDICATIONS:  Current Outpatient Medications:  .  Dimethyl  Fumarate (TECFIDERA) 240 MG CPDR, Take 1 capsule (240 mg total) by mouth 2 (two) times daily., Disp: 60 capsule, Rfl: 11 .  folic acid (FOLVITE) 1 MG tablet, Take 1 tablet (1 mg total) by mouth daily., Disp: 90 tablet, Rfl: 3 .  OLANZapine (ZYPREXA) 10 MG tablet, Take 1 tablet (10 mg total) by mouth at bedtime., Disp: 90 tablet, Rfl: 3 .  sertraline (ZOLOFT) 50 MG tablet, Take 1 tablet (50 mg total) by mouth daily., Disp: 90 tablet, Rfl: 3  PAST MEDICAL HISTORY: Past Medical History:  Diagnosis Date  . Depression   . Mental disorder   . Movement disorder   . MS (multiple sclerosis) (HCC)   . Multiple sclerosis, relapsing-remitting (HCC)   . Neuromuscular disorder (HCC)   . Vision abnormalities     PAST SURGICAL HISTORY: Past Surgical History:  Procedure Laterality Date  . BIOPSY  10/17/2017   Procedure: BIOPSY;  Surgeon: Kathi Der, MD;  Location: [facility];  Service: Gastroenterology;;  . ESOPHAGOGASTRODUODENOSCOPY (EGD) WITH PROPOFOL N/A 10/17/2017   Procedure: ESOPHAGOGASTRODUODENOSCOPY (EGD) WITH PROPOFOL;  Surgeon: Kathi Der, MD;  Location: [facility];  Service: Gastroenterology;  Laterality: N/A;  . IR ANGIOGRAM SELECTIVE EACH ADDITIONAL VESSEL  10/21/2017  . IR ANGIOGRAM SELECTIVE EACH ADDITIONAL VESSEL  10/21/2017  . IR ANGIOGRAM VISCERAL SELECTIVE  10/21/2017  . IR EMBO ART  VEN HEMORR LYMPH EXTRAV  INC GUIDE ROADMAPPING  10/21/2017  . IR US GUIDE VASC ACCESS RIGHT  10/21/2017  . TONSILLECTOMY      FAMILY HISTORY: Family History  Problem Relation Age of Onset  . Heart disease Mother   .  Stroke Father     SOCIAL HISTORY:  Social History   Socioeconomic History  . Marital status: Legally Separated    Spouse name: Not on file  . Number of children: Not on file  . Years of education: Not on file  . Highest education level: Not on file  Occupational History  . Not on file  Social Needs  . Financial resource strain: Not on file  . Food insecurity:     Worry: Not on file    Inability: Not on file  . Transportation needs:    Medical: Not on file    Non-medical: Not on file  Tobacco Use  . Smoking status: Current Every Day Smoker    Packs/day: 1.00    Years: 36.00    Pack years: 36.00    Types: Cigarettes  . Smokeless tobacco: Never Used  Substance and Sexual Activity  . Alcohol use: Yes    Alcohol/week: 0.0 standard drinks    Comment: 11 beers a day x "many years"  . Drug use: Yes    Types: Marijuana    Comment: 1-2 times a month  . Sexual activity: Not Currently    Birth control/protection: None  Lifestyle  . Physical activity:    Days per week: Not on file    Minutes per session: Not on file  . Stress: Not on file  Relationships  . Social connections:    Talks on phone: Not on file    Gets together: Not on file    Attends religious service: Not on file    Active member of club or organization: Not on file    Attends meetings of clubs or organizations: Not on file    Relationship status: Not on file  . Intimate partner violence:    Fear of current or ex partner: Not on file    Emotionally abused: Not on file    Physically abused: Not on file    Forced sexual activity: Not on file  Other Topics Concern  . Not on file  Social History Narrative   ** Merged History Encounter **         PHYSICAL EXAM  Vitals:   09/16/18 0821  BP: 129/85  Pulse: 100  Temp: 98.2 F (36.8 C)  Height:  (1.803 m)    Body mass index is 25.04 kg/m.   General: The patient is well-developed and well-nourished and in no acute distress  Musculoskeletal: Mild cervical and lumbar paraspinal tenderness..  Neurologic Exam  Mental status: The patient is alert and oriented x 3 at the time of the examination.  The patient has apparent normal recent and remote memory, with an apparently normal attention span and concentration ability. The speech is pretty normal today per   Cranial nerves: Extraocular muscles appear to be intact.  Facial strength is normal.  He reports reduced left facial sensation to touch..     Trapezius and sternocleidomastoid strength is normal. No dysarthria is noted.    No obvious hearing deficits are noted.  Motor: Muscle bulk is normal.  Muscle tone is increased in the legs.  Strength is  5 / 5 in all 4 extremities except 4+/5 in hip flexors   Sensory: Sensory testing is intact to soft touch and vibration sensation in all 4 extremities.    Coordination: He has reasonably adequate finger-nose-finger and reduced heel-to-shin.  Gait and station: Station is wide.  He has a shuffling gait.  He does better with a cane.  Romberg  is negative.   Reflexes: Deep tendon reflexes are symmetric and 3+ bilaterally.   There are crossed abductor responses at the knees.    DIAGNOSTIC DATA (LABS, IMAGING, TESTING) - I reviewed patient records, labs, notes, testing and imaging myself where available.  Lab Results  Component Value Date   WBC 3.5 06/04/2018   HGB 14.7 06/04/2018   HCT 43.4 06/04/2018   MCV 93 06/04/2018   PLT 161 06/04/2018      Component Value Date/Time   NA 140 05/12/2018 2212   NA 146 (H) 07/15/2015 1134   K 4.0 05/12/2018 2212   CL 109 05/12/2018 2212   CO2 18 (L) 05/12/2018 2212   GLUCOSE 70 05/12/2018 2212   BUN 16 05/12/2018 2212   BUN 10 07/15/2015 1134   CREATININE 0.98 05/12/2018 2212   CALCIUM 9.0 05/12/2018 2212   PROT 7.6 05/12/2018 2212   PROT 6.6 07/15/2015 1134   ALBUMIN 4.3 05/12/2018 2212   ALBUMIN 4.0 07/15/2015 1134   AST 18 05/12/2018 2212   ALT 12 05/12/2018 2212   ALKPHOS 47 05/12/2018 2212   BILITOT 0.8 05/12/2018 2212   BILITOT 0.3 07/15/2015 1134   GFRNONAA >60 05/12/2018 2212   GFRAA >60 05/12/2018 2212    Lab Results  Component Value Date   VITAMINB12 180 10/17/2017   Lab Results  Component Value Date   TSH 7.010 (H) 04/05/2016     ASSESSMENT AND PLAN  MS (multiple sclerosis) (HCC) - Plan: CBC with Differential/Platelet, Comprehensive  metabolic panel  Bipolar I disorder (HCC)  Gait disturbance  Urinary urgency  High risk medication use  Vitamin D deficiency - Plan: VITAMIN D 25 Hydroxy (Vit-D Deficiency, Fractures)   1.  Continue Tecfidera.  We will check CBC with differential to check for leukopenia, CMP and vitamin D today.  We discussed taking some extra vitamin D and I would call in a prescription strength if below lower limit of normal.  2.  Stay active and exercise as tolerated.   3.  I will refill the olanzapine and start low-dose Zoloft.  However, he is strongly encouraged to follow-up with psychiatry/Behavioral Health.    4.   He will return to see me in 6 months or sooner if he has new or worsening neurologic symptoms.   Richard A. Epimenio Foot, MD, PhD 09/16/2018, 1:31 PM Certified in Neurology, Clinical Neurophysiology, Sleep Medicine, Pain Medicine and Neuroimaging  [facility] [address] [place], [place] [address] [phone]

## 2018-09-17 LAB — COMPREHENSIVE METABOLIC PANEL
ALT: 15 IU/L (ref 0–44)
AST: 20 IU/L (ref 0–40)
Albumin/Globulin Ratio: 2 (ref 1.2–2.2)
Albumin: 4.5 g/dL (ref 3.8–4.8)
Alkaline Phosphatase: 53 IU/L (ref 39–117)
BUN/Creatinine Ratio: 11 (ref 10–24)
BUN: 10 mg/dL (ref 8–27)
Bilirubin Total: 0.4 mg/dL (ref 0.0–1.2)
CO2: 23 mmol/L (ref 20–29)
Calcium: 9.2 mg/dL (ref 8.6–10.2)
Chloride: 110 mmol/L — ABNORMAL HIGH (ref 96–106)
Creatinine, Ser: 0.93 mg/dL (ref 0.76–1.27)
GFR calc Af Amer: 101 mL/min/{1.73_m2} (ref >59)
GFR calc non Af Amer: 87 mL/min/{1.73_m2} (ref >59)
Globulin, Total: 2.3 g/dL (ref 1.5–4.5)
Glucose: 92 mg/dL (ref 65–99)
Potassium: 4.7 mmol/L (ref 3.5–5.2)
Sodium: 136 mmol/L (ref 134–144)
Total Protein: 6.8 g/dL (ref 6.0–8.5)

## 2018-09-17 LAB — CBC WITH DIFFERENTIAL/PLATELET
Basophils Absolute: 0 10*3/uL (ref 0.0–0.2)
Basos: 1 %
EOS (ABSOLUTE): 0.1 10*3/uL (ref 0.0–0.4)
Eos: 4 %
Hematocrit: 44.3 % (ref 37.5–51.0)
Hemoglobin: 15.1 g/dL (ref 13.0–17.7)
Immature Grans (Abs): 0 10*3/uL (ref 0.0–0.1)
Immature Granulocytes: 0 %
Lymphocytes Absolute: 0.6 10*3/uL — ABNORMAL LOW (ref 0.7–3.1)
Lymphs: 18 %
MCH: 31.7 pg (ref 26.6–33.0)
MCHC: 34.1 g/dL (ref 31.5–35.7)
MCV: 93 fL (ref 79–97)
Monocytes Absolute: 0.4 10*3/uL (ref 0.1–0.9)
Monocytes: 10 %
Neutrophils Absolute: 2.3 10*3/uL (ref 1.4–7.0)
Neutrophils: 67 %
Platelets: 157 10*3/uL (ref 150–450)
RBC: 4.76 x10E6/uL (ref 4.14–5.80)
RDW: 13 % (ref 11.6–15.4)
WBC: 3.4 10*3/uL (ref 3.4–10.8)

## 2018-09-17 LAB — VITAMIN D 25 HYDROXY (VIT D DEFICIENCY, FRACTURES): Vit D, 25-Hydroxy: 7.6 ng/mL — ABNORMAL LOW (ref 30.0–100.0)

## 2018-09-18 ENCOUNTER — Telehealth: Payer: Self-pay | Admitting: *Deleted

## 2018-09-18 MED ORDER — VITAMIN D (ERGOCALCIFEROL) 1.25 MG (50000 UNIT) PO CAPS: 50000.0000 [IU] | ORAL_CAPSULE | ORAL | 4 refills | Status: DC

## 2018-09-18 NOTE — Telephone Encounter (Signed)
-----   Message from Asa Lente, MD sent at 09/17/2018 12:44 PM EDT ----- Vitamin D is very low.  Please call in [phone] units weekly #[phone]

## 2018-09-18 NOTE — Telephone Encounter (Signed)
Called pt. Relayed results per Dr. Epimenio Foot. He verbalized understanding. I escirbed Vit D rx to [facility] as requested per pt.

## 2018-10-31 DIAGNOSIS — G35 Multiple sclerosis: Secondary | ICD-10-CM | POA: Diagnosis not present

## 2018-10-31 DIAGNOSIS — F329 Major depressive disorder, single episode, unspecified: Secondary | ICD-10-CM | POA: Diagnosis not present

## 2019-01-07 ENCOUNTER — Other Ambulatory Visit: Payer: Self-pay | Admitting: *Deleted

## 2019-01-07 DIAGNOSIS — G35 Multiple sclerosis: Secondary | ICD-10-CM

## 2019-01-07 MED ORDER — TECFIDERA 240 MG PO CPDR: DELAYED_RELEASE_CAPSULE | ORAL | 11 refills | Status: DC

## 2019-02-19 IMAGING — CT CT HEAD W/O CM
3 series · 15 of 47 positions shown, 18 images · non-contrast
Comparison: 08/11/2015 MRI of the head.

CLINICAL DATA: 62 y/o M; increased confusion and agitation.
Encephalopathy. History of multiple sclerosis and neuromuscular
disorder.

EXAM:
CT HEAD WITHOUT CONTRAST
TECHNIQUE: Contiguous axial images were obtained from the base of the skull
through the vertex without intravenous contrast.

[Series 2: head wo · axial · 0.55mm/px · z∈[+1508,+1663]mm · 9 of 37 slices shown, 12 images]
[im 3/37  brain]
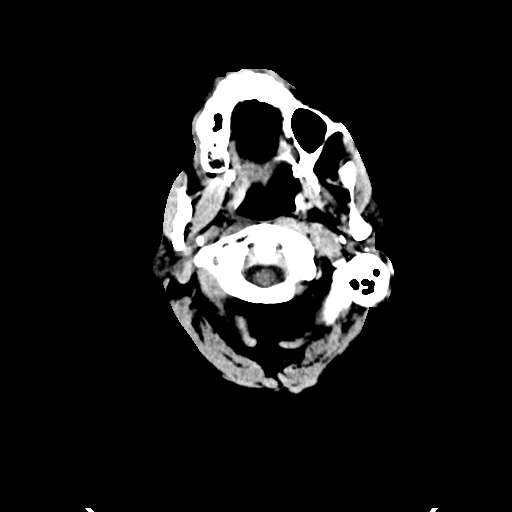
[im 3/37  bone]
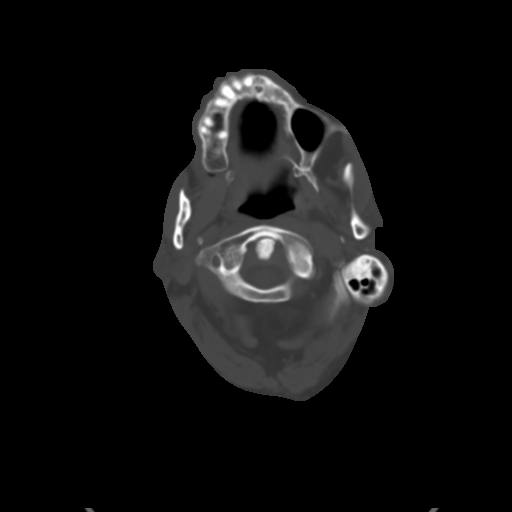
[im 7/37  brain]
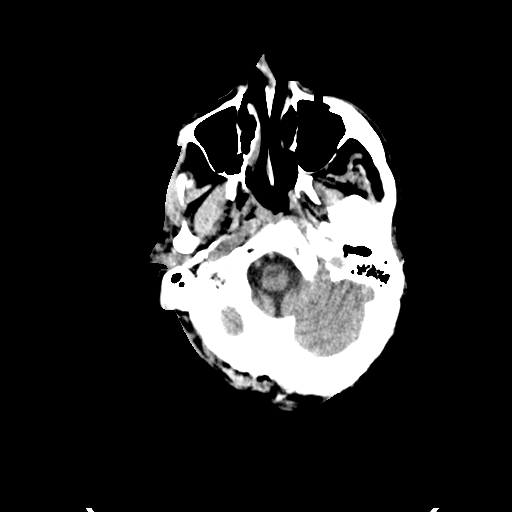
[im 10/37  brain]
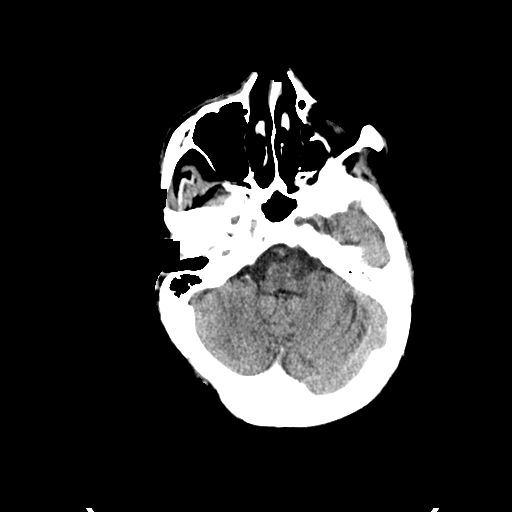
[im 14/37  brain]
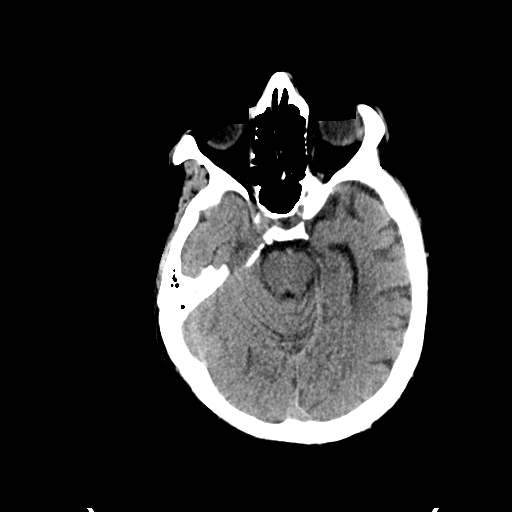
[im 19/37  brain]
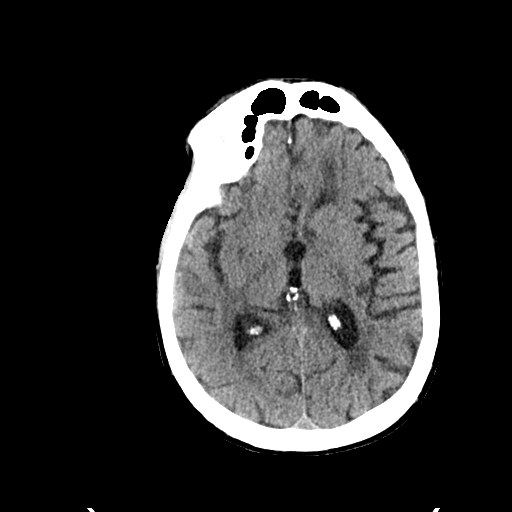
[im 19/37  bone]
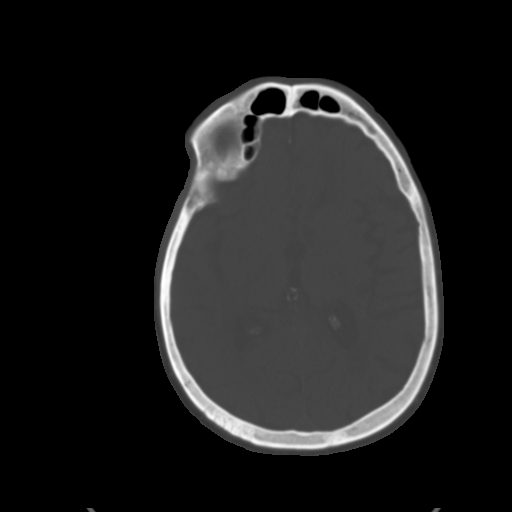
[im 23/37  brain]
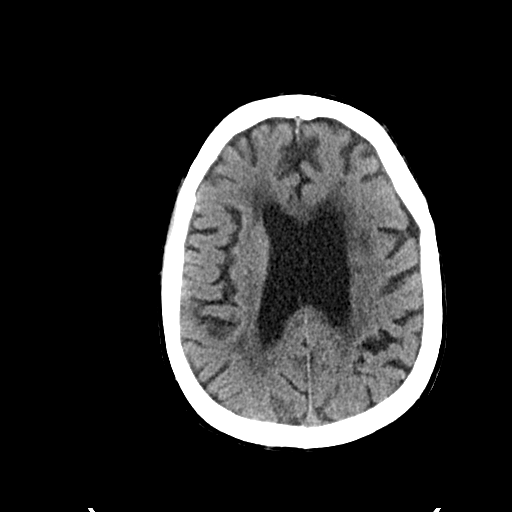
[im 27/37  brain]
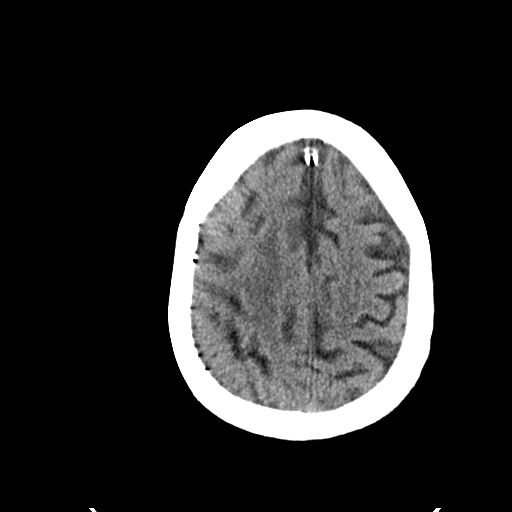
[im 30/37  brain]
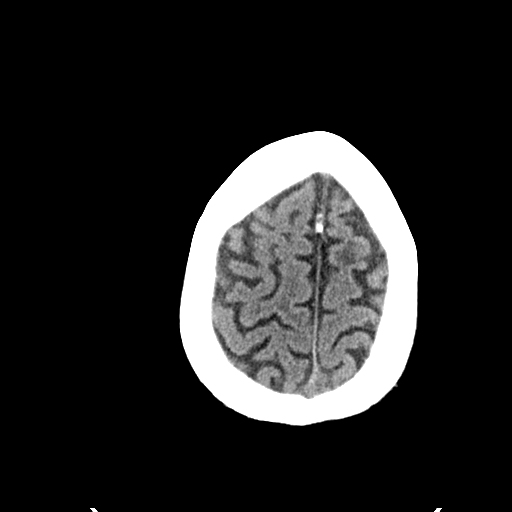
[im 34/37  brain]
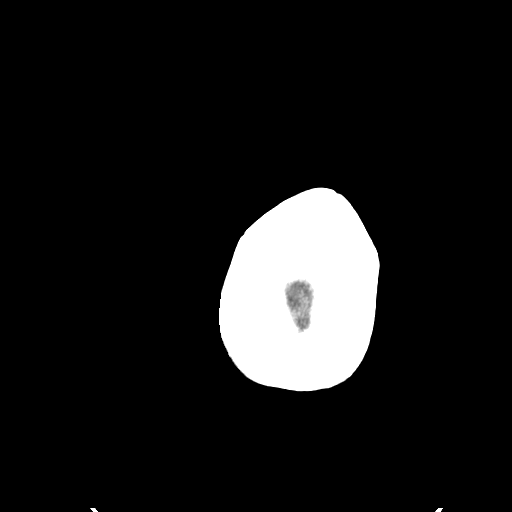
[im 34/37  bone]
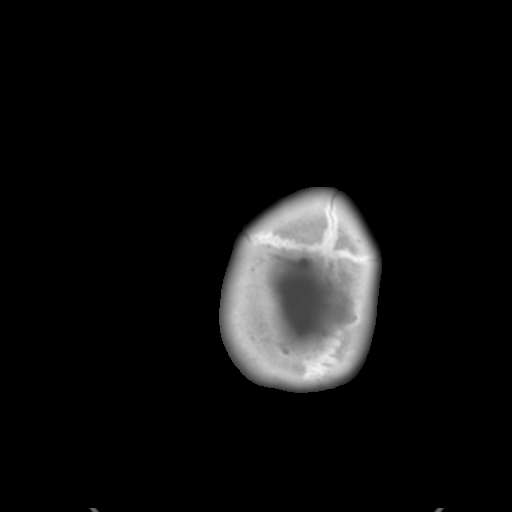

[Series 4: coronal soft tissue · coronal · 0.37mm/px · 3 of 78 slices shown]
[im 26/78  brain]
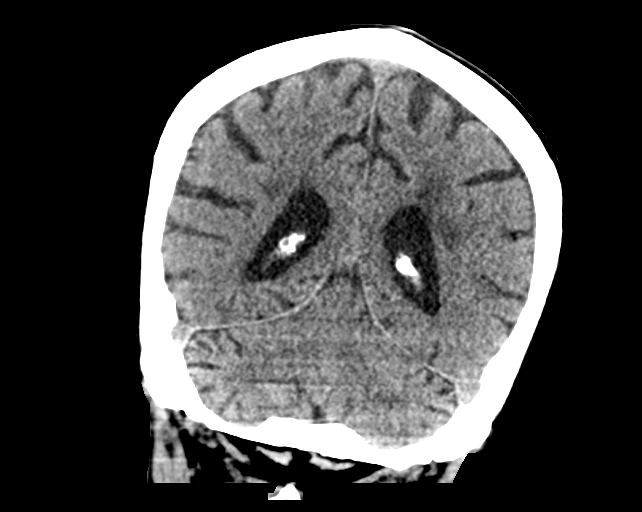
[im 35/78  brain]
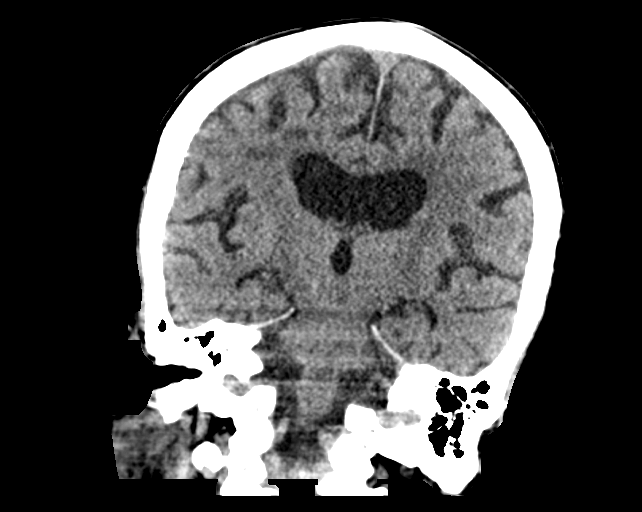
[im 43/78  brain]
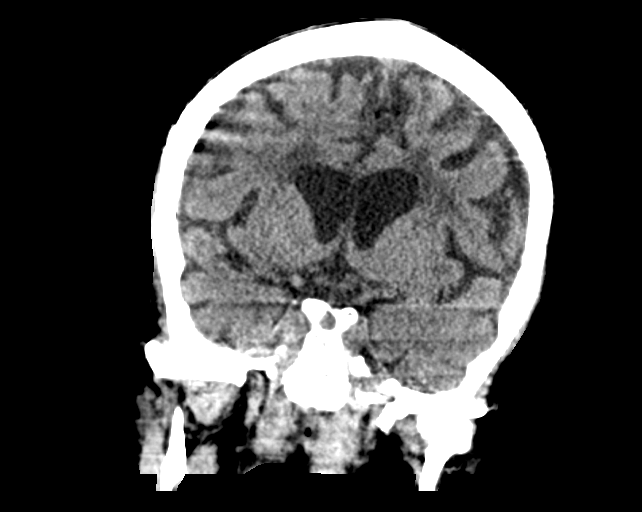

[Series 5: sagittal soft tissue · sagittal · 0.36mm/px · 3 of 67 slices shown]
[im 23/67  brain]
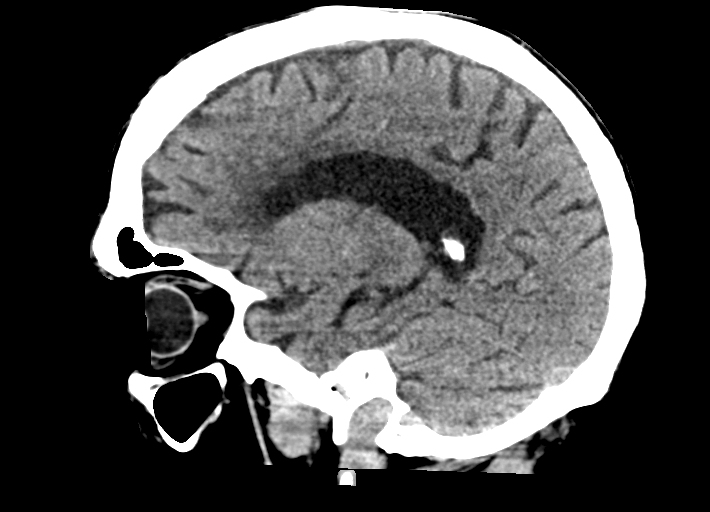
[im 34/67  brain]
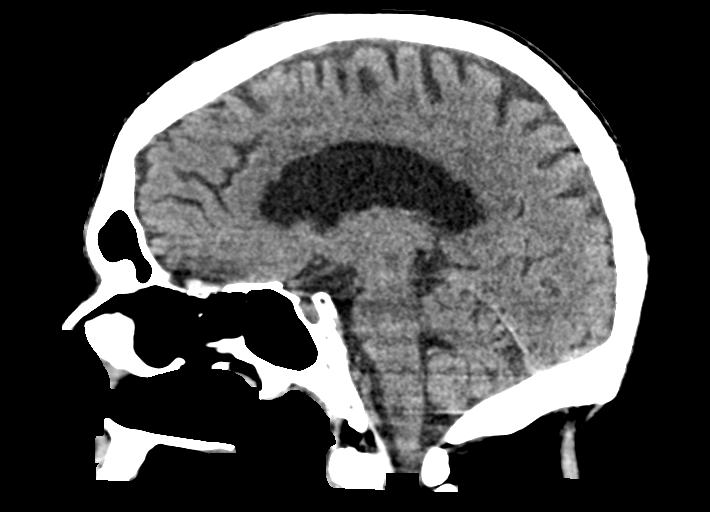
[im 45/67  brain]
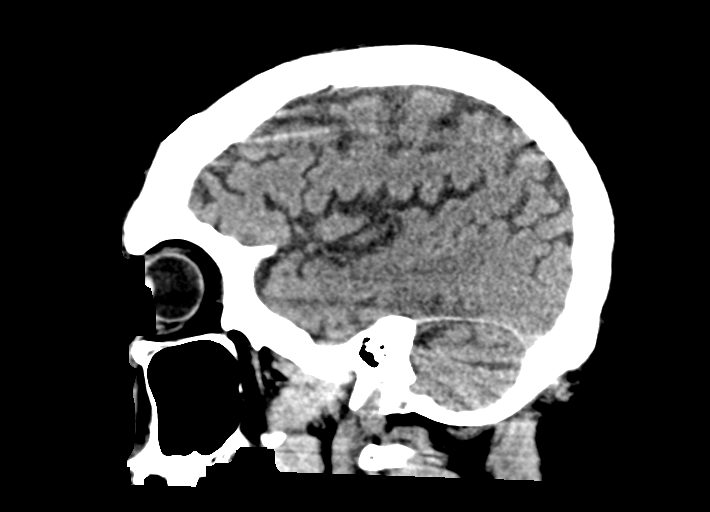

[15 of 47 positions shown; findings below may reference images not displayed]

FINDINGS: Brain: No evidence of acute infarction, hemorrhage, hydrocephalus,
extra-axial collection or mass lesion/mass effect. Stable moderate
diffuse white matter changes and small areas of encephalomalacia
within the superior frontal lobes bilaterally. Stable moderate brain
parenchymal volume loss.

Vascular: Calcific atherosclerosis of carotid siphons. No hyperdense
vessel.

Skull: Normal. Negative for fracture or focal lesion.

Sinuses/Orbits: No acute finding.

Other: None.
IMPRESSION: 1. No acute intracranial abnormality identified.
2. Stable moderate white matter changes and brain parenchymal volume
loss given differences in technique. Stable small areas of
encephalomalacia within the superior frontal gyri.

By: Qambar Ali Nastolin M.D.

## 2019-02-20 IMAGING — DX DG CHEST 1V PORT
1 series · 1 of 1 positions shown · non-contrast
Comparison: Prior chest x-ray 09/24/2016

CLINICAL DATA: 62-year-old male with encephalopathy

EXAM:
PORTABLE CHEST 1 VIEW

[chest ap]
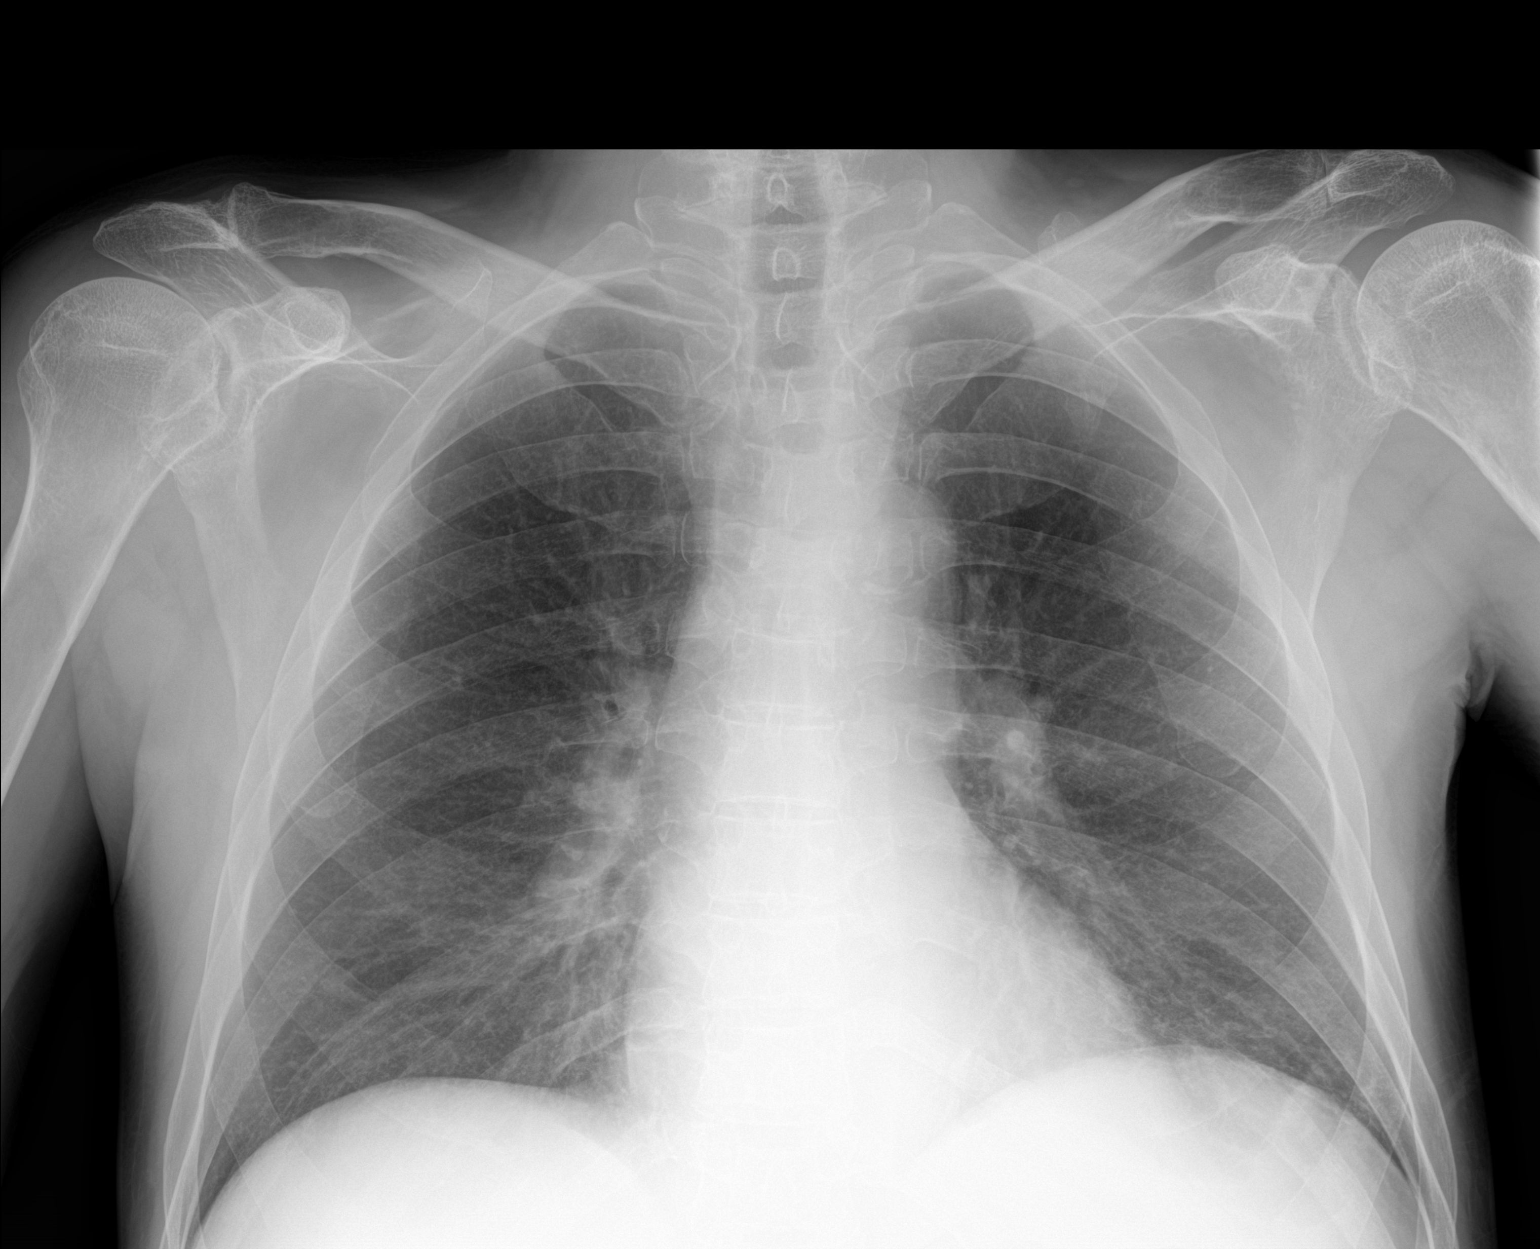

[1 of 1 positions shown; findings below may reference images not displayed]

FINDINGS: The lungs are clear and negative for focal airspace consolidation,
pulmonary edema or suspicious pulmonary nodule. No pleural effusion
or pneumothorax. Cardiac and mediastinal contours are within normal
limits. No acute fracture or lytic or blastic osseous lesions. The
visualized upper abdominal bowel gas pattern is unremarkable.
IMPRESSION: No active disease.

## 2019-03-23 ENCOUNTER — Ambulatory Visit: Payer: Medicare HMO | Admitting: Neurology

## 2019-07-27 ENCOUNTER — Telehealth: Payer: Self-pay | Admitting: *Deleted

## 2019-07-27 NOTE — Telephone Encounter (Signed)
Submitted PA Tecfidera on CMM. HSJ:[id number]. Received instant approval: PA Case: [id number], Status: Approved, Coverage Starts on: 04/24/2019 12:00:00 AM, Coverage Ends on: 04/22/2020 12:00:00 AM. Questions? Contact [phone].

## 2019-08-03 ENCOUNTER — Observation Stay (HOSPITAL_COMMUNITY)
Admission: RE | Admit: 2019-08-03 | Discharge: 2019-08-03 | Disposition: A | Discharge status: Home / Self Care | Payer: Medicare HMO | Source: Home / Self Care | Attending: Psychiatry | Admitting: Psychiatry

## 2019-08-03 ENCOUNTER — Emergency Department (HOSPITAL_COMMUNITY)
Admission: EM | Admit: 2019-08-03 | Discharge: 2019-08-06 | Disposition: A | Discharge status: Home / Self Care | Payer: Medicare HMO | Attending: Emergency Medicine | Admitting: Emergency Medicine

## 2019-08-03 DIAGNOSIS — Z79899 Other long term (current) drug therapy: Secondary | ICD-10-CM | POA: Diagnosis not present

## 2019-08-03 DIAGNOSIS — G35 Multiple sclerosis: Secondary | ICD-10-CM | POA: Insufficient documentation

## 2019-08-03 DIAGNOSIS — Z20822 Contact with and (suspected) exposure to covid-19: Secondary | ICD-10-CM | POA: Insufficient documentation

## 2019-08-03 DIAGNOSIS — F319 Bipolar disorder, unspecified: Secondary | ICD-10-CM | POA: Insufficient documentation

## 2019-08-03 DIAGNOSIS — F17210 Nicotine dependence, cigarettes, uncomplicated: Secondary | ICD-10-CM | POA: Diagnosis not present

## 2019-08-03 DIAGNOSIS — R45851 Suicidal ideations: Secondary | ICD-10-CM | POA: Insufficient documentation

## 2019-08-03 LAB — RESPIRATORY PANEL BY RT PCR (FLU A&B, COVID)
Influenza A by PCR: NEGATIVE
Influenza B by PCR: NEGATIVE
SARS Coronavirus 2 by RT PCR: NEGATIVE

## 2019-08-03 NOTE — Progress Notes (Addendum)
Pt with difficulty standing and ambulating without wheelchair & cane assistance, pt tremulous and states he drank 3-24 oz cans of beer prior to arrival.  Pt incontinent of urine on the bathroom floor, unable to comfortably lay flat in the bed.  Sitting up in bed at present.,  COVID test obtained & sent to lab, pending results.  Yellow socks placed on pt, pt is high fall risk.

## 2019-08-03 NOTE — Progress Notes (Signed)
[facility] Van accompanied by [facility] Amy transported pt to [facility] ED.  Pt A&O x 4, no distress noted, calm & cooperative.  AC [employer] education officer of transport.
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
## 2019-08-03 NOTE — Progress Notes (Signed)
Patient meets inpatient criteria per Lerry Liner, NP. Patient has been faxed out to the following facilities for review:   [facility]  [facility]  [facility]  CSW will continue to follow and assist with finding bed placement.   Drucilla Schmidt, MSW, LCSW-A Clinical Disposition Social Worker [facility]/TTS [phone]

## 2019-08-04 ENCOUNTER — Other Ambulatory Visit: Payer: Self-pay

## 2019-08-04 LAB — COMPREHENSIVE METABOLIC PANEL
ALT: 18 U/L (ref 0–44)
AST: 25 U/L (ref 15–41)
Albumin: 4.1 g/dL (ref 3.5–5.0)
Alkaline Phosphatase: 38 U/L (ref 38–126)
Anion gap: 9 (ref 5–15)
BUN: 22 mg/dL (ref 8–23)
CO2: 20 mmol/L — ABNORMAL LOW (ref 22–32)
Calcium: 9 mg/dL (ref 8.9–10.3)
Chloride: 107 mmol/L (ref 98–111)
Creatinine, Ser: 0.96 mg/dL (ref 0.61–1.24)
GFR calc Af Amer: >60 mL/min (ref >60)
GFR calc non Af Amer: >60 mL/min (ref >60)
Glucose, Bld: 84 mg/dL (ref 70–99)
Potassium: 4.6 mmol/L (ref 3.5–5.1)
Sodium: 136 mmol/L (ref 135–145)
Total Bilirubin: 0.8 mg/dL (ref 0.3–1.2)
Total Protein: 6.9 g/dL (ref 6.5–8.1)

## 2019-08-04 LAB — SALICYLATE LEVEL: Salicylate Lvl: <7 mg/dL — ABNORMAL LOW (ref 7.0–30.0)

## 2019-08-04 LAB — RAPID URINE DRUG SCREEN, HOSP PERFORMED
Amphetamines: NOT DETECTED
Barbiturates: NOT DETECTED
Benzodiazepines: NOT DETECTED
Cocaine: NOT DETECTED
Opiates: NOT DETECTED
Tetrahydrocannabinol: POSITIVE — AB

## 2019-08-04 LAB — CBC
HCT: 42.6 % (ref 39.0–52.0)
Hemoglobin: 14.2 g/dL (ref 13.0–17.0)
MCH: 32.3 pg (ref 26.0–34.0)
MCHC: 33.3 g/dL (ref 30.0–36.0)
MCV: 96.8 fL (ref 80.0–100.0)
Platelets: 191 10*3/uL (ref 150–400)
RBC: 4.4 MIL/uL (ref 4.22–5.81)
RDW: 14.1 % (ref 11.5–15.5)
WBC: 3.7 10*3/uL — ABNORMAL LOW (ref 4.0–10.5)
nRBC: 0 % (ref 0.0–0.2)

## 2019-08-04 LAB — ACETAMINOPHEN LEVEL: Acetaminophen (Tylenol), Serum: <10 ug/mL — ABNORMAL LOW (ref 10–30)

## 2019-08-04 LAB — ETHANOL: Alcohol, Ethyl (B): <10 mg/dL (ref <10)

## 2019-08-04 MED ORDER — ACETAMINOPHEN 325 MG PO TABS: 650.0000 mg | ORAL_TABLET | ORAL | Status: DC | PRN

## 2019-08-04 MED ORDER — THIAMINE HCL 100 MG PO TABS
100.0000 mg | ORAL_TABLET | Freq: Every day | ORAL | Status: DC
  Administered 2019-08-04 – 2019-08-06 (×3): 100 mg via ORAL
  Filled 2019-08-04 (×3): qty 1

## 2019-08-04 MED ORDER — LORAZEPAM 2 MG/ML IJ SOLN: 0.0000 mg | Freq: Two times a day (BID) | INTRAMUSCULAR | Status: DC

## 2019-08-04 MED ORDER — ONDANSETRON HCL 4 MG PO TABS: 4.0000 mg | ORAL_TABLET | Freq: Three times a day (TID) | ORAL | Status: DC | PRN

## 2019-08-04 MED ORDER — NICOTINE 21 MG/24HR TD PT24: 21.0000 mg | MEDICATED_PATCH | Freq: Every day | TRANSDERMAL | Status: DC | PRN

## 2019-08-04 MED ORDER — THIAMINE HCL 100 MG/ML IJ SOLN: 100.0000 mg | Freq: Every day | INTRAMUSCULAR | Status: DC

## 2019-08-04 MED ORDER — DIMETHYL FUMARATE 240 MG PO CPDR
240.0000 mg | DELAYED_RELEASE_CAPSULE | Freq: Two times a day (BID) | ORAL | Status: DC
  Filled 2019-08-04: qty 1

## 2019-08-04 MED ORDER — LORAZEPAM 1 MG PO TABS
0.0000 mg | ORAL_TABLET | Freq: Four times a day (QID) | ORAL | Status: AC
  Administered 2019-08-04: 1 mg via ORAL
  Administered 2019-08-04 – 2019-08-05 (×2): 2 mg via ORAL
  Administered 2019-08-05: 1 mg via ORAL
  Filled 2019-08-04: qty 2
  Filled 2019-08-04 (×2): qty 1
  Filled 2019-08-04: qty 2

## 2019-08-04 MED ORDER — SERTRALINE HCL 100 MG PO TABS
100.0000 mg | ORAL_TABLET | Freq: Every day | ORAL | Status: DC
  Administered 2019-08-04 – 2019-08-06 (×3): 100 mg via ORAL
  Filled 2019-08-04 (×3): qty 1

## 2019-08-04 MED ORDER — OLANZAPINE 5 MG PO TABS
10.0000 mg | ORAL_TABLET | Freq: Every day | ORAL | Status: DC
  Administered 2019-08-04 – 2019-08-05 (×2): 10 mg via ORAL
  Filled 2019-08-04 (×2): qty 2

## 2019-08-04 MED ORDER — LORAZEPAM 2 MG/ML IJ SOLN: 0.0000 mg | Freq: Four times a day (QID) | INTRAMUSCULAR | Status: AC

## 2019-08-04 MED ORDER — LORAZEPAM 1 MG PO TABS: 0.0000 mg | ORAL_TABLET | Freq: Two times a day (BID) | ORAL | Status: DC

## 2019-08-04 MED ORDER — FOLIC ACID 1 MG PO TABS
1.0000 mg | ORAL_TABLET | Freq: Every day | ORAL | Status: DC
  Administered 2019-08-04 – 2019-08-06 (×3): 1 mg via ORAL
  Filled 2019-08-04 (×3): qty 1

## 2019-08-04 NOTE — ED Notes (Signed)
Pt asking for psych meds - BH SW to ask [facility] NP. Pt aware of tx plan.

## 2019-08-04 NOTE — ED Notes (Signed)
Patient attempted to ambulate to the bathroom but stated he could not;RN assisted patient to standing and walked holding patient hand to the bathroom; pt shuffled his feet and had glossed over look in eyes; patient did not seem focused on where he was going; RN used walker to assist patient back to bed; Patient has times of ambulating just find without assist and others he seems to not try to move his own body; RN not sure if this is truly patient not being able to complete ADLs w/o assist or if it is behavior; Patient was able to put his own legs in bed and position self w/o assistance-Monique,RN

## 2019-08-04 NOTE — ED Notes (Signed)
Pt noted to be pacing in room intermittently then lying back down on bed.

## 2019-08-04 NOTE — Progress Notes (Signed)
Patient ID: [name], male   DOB: Mar 03, 1956, 64 y.o.   MRN: [id number]   Patient requested to restart home medications (psychotrophics). His medications were verified. Restarted Zyprexa 10 mg po daily at bedtime and Zoloft 100 mg po daily.

## 2019-08-04 NOTE — BH Assessment (Signed)
[facility] Assessment Progress Note   Patient was seen for re-assessment.  He states that he is still depressed, but states that he is not as suicidal as he was yesterday.   Patient states that he has a history of depression and states that he has been suicidal in the past and states that he has previous admissions to inpatient behavioral health units, but cannot identify when or where he was hospitalized. Patient states that he has thought about jumping off a bridge near his home. Patient is definitely depressed, but it also appears that he is no longer to manage his ADL's without assistance. Patient reports, "I shit all over myself this morning." Patient states that he has no support other than his roommate and he states, "we don't always get along."   TTS to continue to seek inpatient geriatric psych inpatient.

## 2019-08-04 NOTE — ED Triage Notes (Signed)
Sent by [facility]. Per staff pt unable to take care of self so they could not take care of him. Pt went there for SI. States he has had these thoughts for years.

## 2019-08-04 NOTE — ED Notes (Signed)
Pt declined to call his roommate to request for him to bring his MS med - Dimethyl Fumarate. Psych meds ordered by [facility] NP.

## 2019-08-04 NOTE — ED Notes (Signed)
ED Provider at bedside. 

## 2019-08-04 NOTE — ED Notes (Signed)
Pt ambulated to bathroom at 0715 w/stand-by assistance and back to room on his own - steady gait noted. Pt noted to be wearing personal shirt and hospital gown. Sitter w/pt. Breakfast tray delivered.

## 2019-08-04 NOTE — Progress Notes (Signed)
Patient declined at [facility] due to medical acuity.   Drucilla Schmidt, MSW, LCSW-A Clinical Disposition Social Worker [facility]/TTS [phone]

## 2019-08-04 NOTE — ED Notes (Signed)
Patient woke up and stated he had to go to the bathroom; pt did not attempt to move his own body and just sat at the edge of the bed; RN asked patient how he perform ADLs at home patient got agitated with the question and stated he does not know; Staff tried to assist patient to the wheel chair but patient was dead weight; RN opted to help patient stand and use the urinal; once done patient returned him self to bed with no problem; but he remain sitting up; when asked what he needed he states "I need to adjust my feet" RN removed covers from patient's legs and patient just sat there and did not move; RN left the room to allow patient to adjust himself in the bed; 5 minutes later patient stood up w/o assistance and was seen walking to the bathroom with Sitter walking on stand by; Pt ambulated to and from the bathroom with no assistance and placed himself back in the bed with out assistance; Patient is able to perform ADLs on his own-Monique,RN

## 2019-08-04 NOTE — ED Provider Notes (Signed)
[facility] EMERGENCY DEPARTMENT Provider Note   CSN: [id number] Arrival date & time: 08/03/19  2307     History No chief complaint on file.   [name] is a 64 y.o. male.  64 year old male with a history of mental disorder, multiple sclerosis, depression presents to the ED for medical clearance following evaluation at [facility].  Patient was suicidal ideations and thoughts of jumping off of a bridge.  Has no additional complaints at this time.  Reports that he has not seen a mental health specialist in a while.  Denies homicidal ideations as well as alcohol use.  UDS positive only for marijuana; no other reported ingestions.  The history is provided by the patient and medical records. No language interpreter was used.       Past Medical History:  Diagnosis Date  . Depression   . Mental disorder   . Movement disorder   . MS (multiple sclerosis) (HCC)   . Multiple sclerosis, relapsing-remitting (HCC)   . Neuromuscular disorder (HCC)   . Vision abnormalities     Patient Active Problem List   Diagnosis Date Noted  . Vitamin D deficiency 09/16/2018  . Altered mental status   . Acute encephalopathy   . Goals of care, counseling/discussion   . Palliative care encounter   . GI bleed 10/18/2017  . Symptomatic anemia   . Upper GI bleeding 10/16/2017  . Coccygeal pain, acute 04/08/2017  . Elevated TSH 04/06/2016  . Bipolar I disorder (HCC) 04/05/2016  . 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 Numbness 01/16/2016  . Alcohol abuse 07/16/2015  . Other fatigue 06/29/2014  . Gait disturbance 06/29/2014  . Urinary urgency 06/29/2014  . High risk medication use 06/29/2014  . MS (multiple sclerosis) (HCC) 10/31/2011    Class: Chronic  . Dysthymia 05/11/2011    Past Surgical History:  Procedure Laterality Date  . BIOPSY  10/17/2017   Procedure: BIOPSY;  Surgeon: Kathi Der, MD;  [facility];  Service: Gastroenterology;;    . ESOPHAGOGASTRODUODENOSCOPY (EGD) WITH PROPOFOL N/A 10/17/2017   Procedure: ESOPHAGOGASTRODUODENOSCOPY (EGD) WITH PROPOFOL;  Surgeon: Kathi Der, MD;  [facility];  Service: Gastroenterology;  Laterality: N/A;  . IR ANGIOGRAM SELECTIVE EACH ADDITIONAL VESSEL  10/21/2017  . IR ANGIOGRAM SELECTIVE EACH ADDITIONAL VESSEL  10/21/2017  . IR ANGIOGRAM VISCERAL SELECTIVE  10/21/2017  . IR EMBO ART  VEN HEMORR LYMPH EXTRAV  INC GUIDE ROADMAPPING  10/21/2017  . IR US GUIDE VASC ACCESS RIGHT  10/21/2017  . TONSILLECTOMY         Family History  Problem Relation Age of Onset  . Heart disease Mother   . Stroke Father     Social History   Tobacco Use  . Smoking status: Current Every Day Smoker    Packs/day: 1.00    Years: 36.00    Pack years: 36.00    Types: Cigarettes  . Smokeless tobacco: Never Used  Substance Use Topics  . Alcohol use: Yes    Alcohol/week: 0.0 standard drinks    Comment: 11 beers a day x "many years"  . Drug use: Yes    Types: Marijuana    Comment: 1-2 times a month    Home Medications Prior to Admission medications   Medication Sig Start Date End Date Taking? Authorizing Provider  folic acid (FOLVITE) 1 MG tablet Take 1 tablet (1 mg total) by mouth daily. 09/16/18   Sater, Pearletha Furl, MD  OLANZapine (ZYPREXA) 10 MG tablet  Take 1 tablet (10 mg total) by mouth at bedtime. 09/16/18   Sater, Pearletha Furl, MD  sertraline (ZOLOFT) 50 MG tablet Take 1 tablet (50 mg total) by mouth daily. 09/16/18   Sater, Pearletha Furl, MD  TECFIDERA 240 MG CPDR Take 1 capsule by mouth twice daily 01/07/19   Sater, Pearletha Furl, MD  Vitamin D, Ergocalciferol, (DRISDOL) 1.25 MG (50000 UT) CAPS capsule Take 1 capsule (50,000 Units total) by mouth every 7 (seven) days. 09/18/18   Sater, Pearletha Furl, MD    Allergies    Patient has no known allergies.  Review of Systems   Review of Systems  Ten systems reviewed and are negative for acute change, except as noted in the HPI.    Physical  Exam Updated Vital Signs There were no vitals taken for this visit.  Physical Exam Vitals and nursing note reviewed.  Constitutional:      General: He is not in acute distress.    Appearance: He is well-developed. He is not diaphoretic.     Comments: Nontoxic appearing  HENT:     Head: Normocephalic and atraumatic.  Eyes:     General: No scleral icterus.    Conjunctiva/sclera: Conjunctivae normal.  Pulmonary:     Effort: Pulmonary effort is normal. No respiratory distress.  Musculoskeletal:        General: Normal range of motion.     Cervical back: Normal range of motion.  Skin:    General: Skin is warm and dry.     Coloration: Skin is not pale.     Findings: No erythema or rash.  Neurological:     Mental Status: He is alert and oriented to person, place, and time.  Psychiatric:        Mood and Affect: Affect is flat.        Speech: Speech normal.        Behavior: Behavior is withdrawn.        Thought Content: Thought content includes suicidal ideation.     ED Results / Procedures / Treatments   Labs (all labs ordered are listed, but only abnormal results are displayed) Labs Reviewed  COMPREHENSIVE METABOLIC PANEL - Abnormal; Notable for the following components:      Result Value   CO2 20 (*)    All other components within normal limits  SALICYLATE LEVEL - Abnormal; Notable for the following components:   Salicylate Lvl <7.0 (*)    All other components within normal limits  ACETAMINOPHEN LEVEL - Abnormal; Notable for the following components:   Acetaminophen (Tylenol), Serum <10 (*)    All other components within normal limits  CBC - Abnormal; Notable for the following components:   WBC 3.7 (*)    All other components within normal limits  RAPID URINE DRUG SCREEN, HOSP PERFORMED - Abnormal; Notable for the following components:   Tetrahydrocannabinol POSITIVE (*)    All other components within normal limits  ETHANOL    EKG None  Radiology No results  found.  Procedures Procedures (including critical care time)  Medications Ordered in ED Medications - No data to display  ED Course  I have reviewed the triage vital signs and the nursing notes.  Pertinent labs & imaging results that were available during my care of the patient were reviewed by me and considered in my medical decision making (see chart for details).    MDM Rules/Calculators/A&P  64 year old male presenting to the ED for medical clearance.  Coming from [place] after he was evaluated for suicidal ideations with [place].  He has been recommended for inpatient treatment.  TTS to seek placement.  Labs reviewed and patient medically cleared.  Daily medications ordered; Zoloft and Zyprexa held as patient unable to confirm whether he was still prescribed these medicines.   Final Clinical Impression(s) / ED Diagnoses Final diagnoses:  Bipolar 1 disorder [facility])    Rx / DC Orders ED Discharge Orders    None       Antonietta Breach, PA-C 08/04/19 0230    Orpah Greek, MD 08/15/19 [phone]

## 2019-08-04 NOTE — ED Notes (Signed)
Sitter has left. Staffing Office advised no Comptroller available for pt - Consulting civil engineer aware. Pt being monitored by staff.

## 2019-08-04 NOTE — ED Notes (Signed)
Breakfast ordered 

## 2019-08-04 NOTE — ED Notes (Signed)
Pt noted w/tremors and restless. Pt report drinks 3 - 25 oz of ETOH daily - last was 08/02/19. Message sent to Dr Criss Alvine.

## 2019-08-04 NOTE — ED Notes (Addendum)
Pt noted to be pacing in room - attempting to ambulate in hallway - pt re-directed to room. TV turned on for pt as requested.

## 2019-08-04 NOTE — Progress Notes (Signed)
Patient continues to meet inpatient criteria per Lerry Liner, NP. Patient has been re-faxed out to the following:   [facility]  [facility] [facility]   [facility]  [facility]  [facility]  [facility]  [facility]  [facility]  CSW will continue to follow and assist with finding bed placement.   Drucilla Schmidt, MSW, LCSW-A Clinical Disposition Social Worker [facility]/TTS [phone]

## 2019-08-04 NOTE — ED Notes (Signed)
Pt incont of stool - pt finished in bathroom - Sitter/NT assisting pt w/showering.

## 2019-08-04 NOTE — ED Notes (Signed)
Pt laid down on bed briefly then noted to be pacing in room. Escorted pt to bathroom - emptied bladder after much encouragement. Pt escorted back to room - stand-by assist.

## 2019-08-05 NOTE — ED Notes (Signed)
BH counselor called,TTS at bedside.

## 2019-08-05 NOTE — ED Notes (Signed)
Patient continues to try to climb out of bed; pt is found several times on all fours in the bed; pt calls out for a person who is not there as well as speaks about a cat being in the room; pt continues to ask to go to the bathroom after been offered toleiting  by staff; Patient is restless and medication has been administered; RN will continue to [facility]

## 2019-08-05 NOTE — ED Notes (Signed)
RN spent 20 minute in the room with patient trying to convince patient to use urinal; pt kept stating he had to go to the bathroom but refused all options given; RN did not feel comfortable walking patient the bathroom since last episode; patient appears to be hallucinating and pulling at things in the air; when asked what he is doing patient mumbles something that RN can not understand; RN placed dry brief on patient after attempting to have patient use urinal. Bed alarm applied-Monique,RN

## 2019-08-05 NOTE — ED Notes (Signed)
SI-Inpt   bfast ordered

## 2019-08-05 NOTE — BHH Counselor (Signed)
Pt remains at [facility] due to suicidal ideation and depressive symptoms.  Pt was reassessed.  He was difficult to rouse, stared off, and spoke softly with a slur.  Pt asked, ''Any chance I can get out of here?"  Author asked Pt about mood and suicidal ideation.  Pt looked around the room, began to get up from bed.  Pt did not respond to questions about safety.  Pt appears to be in altered mental status.  Recommend continued inpatient placement.

## 2019-08-05 NOTE — ED Notes (Signed)
Attempted to reach [facility] counselors. TTS in room, waiting on their call.

## 2019-08-05 NOTE — ED Notes (Signed)
Pt friend, Lenox Ahr, listed in chart, called to say he can bring medication for MS tomorrow. [phone]

## 2019-08-05 NOTE — ED Notes (Signed)
Left vm for roommate Viviann Spare, regard MS medication. The medication is not available here and perhaps they could bring it  In.

## 2019-08-06 ENCOUNTER — Telehealth: Payer: Self-pay | Admitting: Surgery

## 2019-08-06 NOTE — ED Notes (Signed)
Pt awake and now willing to eat breakfast. Asks staff to pull him up in bed. Noted to pull his legs up in bed himself but when asked to do this states "how? I don't know how." Calm and cooperative otherwise.

## 2019-08-06 NOTE — ED Notes (Signed)
Medication not carried by pharmacy([facility]) brought by roommate. Given to pharmacy to repackage.

## 2019-08-06 NOTE — Progress Notes (Signed)
Patient ID: [name], male   DOB: 1955/05/12, 64 y.o.   MRN: [id number]   Psychiatric reassessment   HPI: [name] is an 64 y.o. male, who presents voluntary and unaccompanied to [facility]. Clinician asked the pt, "what brought you to the hospital?" Pt reported, he is suicidal with a [place]. Pt reported, he's had suicidal thoughts for years. Pt reported, "I wish I could catch COVID." Pt reported, he does not know what is triggering his suicidal thoughts. Pt reported, he called the police and was brought to [facility]. Pt reported, having trouble speaking. Pt denies, HI, AVH, self-injurious behaviors and access to weapons.   Pt reported, smoking a "bowl," a joint early this morning. Pt's reported, taking medications as prescribed. Pt has previous inpatient admissions  Psychiatric evaluation [name] is a 64 year old male who has a hx of depression, Bipolar I disorder, MS, alcohol abuse, and suicidal ideation. He presented to [facility] 08/03/2019 for concerns as noted above. During this evaluation, he is alert and oriented to person place and time although seems a little groggy. He is calm and cooperative and very soft spoken. He denied current SI with plan or intent. He stated that his suicidal thoughts," come and go" and that he has had the thoughts for years. Along with him denying current suicidal thoughts with plan or intent to writer, perchart review, he stated to the EDP, Dr. Effie Shy that his suicidal thoughts had not occurred for the past 48 hours. In regard to his history of suicidal thoughts, during previous evaluations, he has stated that his plan of suicide was to jump off a bridge and overdose on pills which he initially stated as a plan when he first presented to [facility] as a walk in. Per chart review, he has had a prior suicide attempt in 2018 although he denied recent attempts of self- harm or to kill himself. He denied AVH and other psychosis and  does not appear internally preoccupied. He admitted to a history of alcohol use reporting that he drink at least 75 oz of beer daily. On initital evaluation, it was documented that he presented with difficulty standing and ambulating without wheelchair & cane assistance, was  tremulous and stated he drank 3-24 oz cans of beer prior to arrival. He also admitted to [facility] use but denied other substance abuse or use. He denied have a current psychiatric provider or therapist.He is however on psychotropic medication which were restarted while in the ED.   Disposition: I have thoroughly reviewed patients chart and psychiatric history. He denies SI, HI and AVH at this time and further voices no plan or intent to harm or kill himself if he was to be discharged. He has a history of MS and there are reports that patient has required some assistance with his ADL's. It is unclear if thi sis related to worsening of MS so it may be beneficial for a home care aide to assist patient with ADL needs. I am going to reccommend a social work consult to discuss resources.  At this time, there is no evidence of imminent risk to self or others at present. Case was discussed with Dr. Lucianne Muss an we agreed that patient could be psychiatrically cleared. I will ask social wok to fax over resources for outpatient psychiatric services. Dr. Effie Shy updated on current disposition and request for social work consult.

## 2019-08-06 NOTE — ED Notes (Signed)
Pt walked (slowly and shuffling) with walker and sitter to the bathroom then back to the room.

## 2019-08-06 NOTE — ED Notes (Signed)
This Pt is requesting to leave at this moment; this RN informed this pt we were waiting on the results of the Welfare Check.

## 2019-08-06 NOTE — Social Work (Signed)
CSW met with Pt at bedside and discussed needs for outpatient therapy. CSW gave resource list for therapists in the area and encouraged patient to seek therapy. Pt discussed past trauma and CSW gave brief detail of the impact of childhood trauma throughout life and the importance of utilizing mental health professionals. Pt stated that he understood and that he agreed that a therapist would be a good idea for him. Pt stated that he had the ability to call the numbers on the resource list.

## 2019-08-06 NOTE — Progress Notes (Signed)
Offered patient breakfast tray, pt is still sleeping

## 2019-08-06 NOTE — Discharge Summary (Signed)
Not seen by Jeannine Kitten but chart as follows: Psychiatric reassessment   HPI: Alexander Hellmann McGeeis an 64 y.o.male, who presents voluntary and unaccompanied to [facility].Clinician asked the pt, "what brought you to the hospital?"Pt reported, he is suicidal with a [place] and [place]. Pt reported, he's had suicidal thoughts for years. Pt reported, "I wish I could catch COVID." Pt reported, he does not know what is triggering his suicidal thoughts. Pt reported, he called the police and was brought to [facility]. Pt reported, having trouble speaking. Pt denies, HI, AVH, self-injurious behaviors and access to weapons.   Ptreported, smoking a "bowl," a joint early this morning.Pt's reported, taking medications as prescribed. Pt has previous inpatient admissions  Psychiatric evaluation [name] is a 64 year old male who has a hx of depression, Bipolar I disorder, MS, alcohol abuse, and suicidal ideation. He presented to [facility] 08/03/2019 for concerns as noted above. During this evaluation, he is alert and oriented to person place and time although seems a little groggy. He is calm and cooperative and very soft spoken. He denied current SI with plan or intent. He stated that his suicidal thoughts," come and go" and that he has had the thoughts for years. Along with him denying current suicidal thoughts with plan or intent to writer, perchart review, he stated to the EDP, Dr. Effie Shy that his suicidal thoughts had not occurred for the past 48 hours. In regard to his history of suicidal thoughts, during previous evaluations, he has stated that his plan of suicide was to jump off a bridge and overdose on pills which he initially stated as a plan when he first presented to [facility] as a walk in. Per chart review, he has had a prior suicide attempt in 2018 although he denied recent attempts of self- harm or to kill himself. He denied AVH and other psychosis and does not appear internally  preoccupied. He admitted to a history of alcohol use reporting that he drink at least 75 oz of beer daily. On initital evaluation, it was documented that he presented with difficulty standing and ambulating without wheelchair &cane assistance, was  tremulous and stated he drank 3-24 oz cans of beer prior to arrival. He also admitted to [facility] use but denied other substance abuse or use. He denied have a current psychiatric provider or therapist.He is however on psychotropic medication which were restarted while in the ED.   Disposition: I have thoroughly reviewed patients chart and psychiatric history. He denies SI, HI and AVH at this time and further voices no plan or intent to harm or kill himself if he was to be discharged. He has a history of MS and there are reports that patient has required some assistance with his ADL's. It is unclear if thi sis related to worsening of MS so it may be beneficial for a home care aide to assist patient with ADL needs. I am going to reccommend a social work consult to discuss resources.  At this time, there is no evidence of imminent risk to self or others at present. Case was discussed with Dr. Lucianne Muss an we agreed that patient could be psychiatrically cleared. I will ask social wok to fax over resources for outpatient psychiatric services. Dr. Effie Shy updated on current disposition and request for social work consult.    Malvin Johns, MD 08/06/2019, 2:33 PM

## 2019-08-06 NOTE — Discharge Instructions (Signed)
Continue your current meds   See your doctor   REturn to ER if you have thoughts of harming yourself or others, hallucinations

## 2019-08-06 NOTE — ED Notes (Signed)
Pt taken in wheelchair to [facility] where this pt ambulated from the wheelchair into the cab using his cane.

## 2019-08-06 NOTE — ED Provider Notes (Addendum)
Emergency Medicine Observation Re-evaluation Note  [name] is a 64 y.o. male, seen on rounds today.  Pt initially presented to the ED for complaints of No chief complaint on file. Currently, the patient is resting comfortably alert and interactive.  He speaks very softly and occasionally is difficult to understand what he is saying.  He states he has not been suicidal for the last 48 hours.  He states he takes his medications for MS however he has not had it since he has been here.  Yesterday TTS evaluated him and he was not communicative, so they elected to continue to pursue psychiatric treatment.  Patient denies alcohol or substance abuse to me, however it was reported by prior emergency department providers that he drinks several beers every day.  This indicates that he may have an alcohol abuse problem.  Physical Exam  BP 113/64 (BP Location: Left Arm)   Pulse 68   Temp 98.1 F (36.7 C) (Axillary)   Resp 18   SpO2 96%  Physical Exam he appears disheveled, he speaks very softly.  Occasionally his words are not intelligible.  ED Course / MDM  EKG:  Clinical Course as of Aug 06 1355  Thu Aug 06, 2019  1354 I discussed the case with TTS who have psychiatric cleared him.  Patient requesting some help with "ADLs," during our discussion.  Transitional care consult has been ordered.   [EW]    Clinical Course User Index [EW] Mancel Bale, MD   I have reviewed the labs performed to date as well as medications administered while in observation.  Recent changes in the last 24 hours include he apparently is not suicidal, for which he presented for evaluation [facility]. Plan  Current plan is for TTS reassessment today.  They cleared him for discharge.  Patient is not under full IVC at this time.   Transitions of care consult: They arranged for placement and admission, in [facility].   Mancel Bale, MD 08/07/19 [phone]

## 2019-08-06 NOTE — ED Notes (Signed)
This RN is coordinating with Case Management to determine a safe and effective means of discharging this pt; They will keep this RN informed of progress.

## 2019-08-06 NOTE — ED Notes (Signed)
Pt is now psych cleared, will need CSW consult prior to dc with outpatient resources. BH provider states she will call EDP to communicate plan

## 2019-08-06 NOTE — ED Notes (Signed)
Pt is now requesting to leave even though this RN could not guarantee this pt a safe way into his home and without a walker to enter the house. This pt still wished to leave and stated he would like to leave as soon as possible despite any circumstances. After conversing with Charge RN and Case Management, this RN returned this pt his belongings along with his medications and discharged this pt with a cab. Patient verbalizes understanding of discharge instructions. Opportunity for questioning and answers were provided. Armband removed by staff, pt discharged from ED in a wheelchair to home through a cab.

## 2019-08-06 NOTE — ED Provider Notes (Signed)
  Physical Exam  BP 113/64   Pulse 68   Temp 98.1 F (36.7 C) (Axillary)   Resp 18   SpO2 96%   Physical Exam  ED Course/Procedures   Clinical Course as of Aug 06 1719  Thu Aug 06, 2019  1354 I discussed the case with TTS who have psychiatric cleared him.  Patient requesting some help with "ADLs," during our discussion.  Transitional care consult has been ordered.   [EW]    Clinical Course User Index [EW] Mancel Bale, MD    Procedures  MDM  Social work saw patient and gave her a list of resources for outpatient therapist.  Stable for discharge.   Charlynne Pander, MD 08/06/19 [phone]

## 2019-08-06 NOTE — ED Notes (Signed)
This RN observed this pt ambulate well with very minimal assistance to and from the BR. This pt also ambulated well without the walker. This Pt was also able to use the commode without any assistance.

## 2019-08-06 NOTE — Care Management (Signed)
ED CM called [facility] non-emergency police to perform a wellness check

## 2019-08-06 NOTE — ED Notes (Signed)
Breakfast ordered 

## 2019-08-06 NOTE — ED Notes (Signed)
SW at  Bedside.

## 2019-08-06 NOTE — ED Notes (Signed)
Pt ambulated well to the BR with a walker with very minimal assistance.

## 2019-08-10 ENCOUNTER — Ambulatory Visit (HOSPITAL_COMMUNITY)
Admission: AD | Admit: 2019-08-10 | Discharge: 2019-08-10 | Disposition: A | Discharge status: Home / Self Care | Payer: Medicare HMO | Attending: Psychiatry | Admitting: Psychiatry

## 2019-08-10 NOTE — H&P (Signed)
Behavioral Health Medical Screening Exam  [name] is an 64 y.o. male patient presents to [facility] as a walk in via [employer].  Patient states that he came to the hospital because he was depressed.  Patient states he is not sure what he needs.  States that he is taking his medication but has not followed up with any resources given for psychiatric services.  Patient states that he also feels that he may need to be in an assisted living facility until he is able to get better.  States that he lives with a roommate; but unable to recall the name of roommate with out thinking of the name.  Informed patient that would refer to social services to assist with his needs but he did not need to be admitted to psychiatric hospital.  Patient denies suicidal/self-harm/homicidal ideation, psychosis, and paranoia.    During evaluation [name] is sitting up right in wheel chair; he is alert/oriented x 4; calm/cooperative; and mood congruent with affect.  Patient is speaking in a clear tone at decreased volume, and normal pace; with good eye contact.  His thought process is coherent and relevant; There is no indication that he is currently responding to internal/external stimuli or experiencing delusional thought content.  Patient denies suicidal/self-harm/homicidal ideation, psychosis, and paranoia.  Patient has remained calm throughout assessment and has answered questions appropriately.   Department, and calling Suicide Hotline  Total Time spent with patient: 30 minutes  Psychiatric Specialty Exam: Physical Exam  Vitals reviewed. Constitutional: He is oriented to person, place, and time. No distress.  Patient appears unkept; hair soiled, nails unkept, clothing soiled  Respiratory: Effort normal.  Neurological: He is alert and oriented to person, place, and time.  Skin: Skin is warm and dry.  Psychiatric: His speech is normal and behavior is normal. Judgment normal. Thought content is not paranoid  and not delusional. Cognition and memory are normal. He exhibits a depressed mood (Stable). He expresses no homicidal and no suicidal ideation.    Review of Systems  Psychiatric/Behavioral: Negative for agitation, behavioral problems, hallucinations, self-injury, sleep disturbance and suicidal ideas (Denies at this time; states the last time was "Tuesday" of last week).       Patient states he has not follow up with any recommended services given to him last week "I didn't know that I was suppose to."  States that he has been taking his medication that was restarted Zyprexa" He also reports that his roommate gets the beer for him and he has been drinking 3-4 beers a day.  States he is not sure what he needs.  All other systems reviewed and are negative.   Blood pressure 114/69, pulse 87, temperature 98.5 F (36.9 C), temperature source Oral, resp. rate 16, SpO2 97 %.There is no height or weight on file to calculate BMI.  General Appearance: Casual  Clothing, hair soiled  Eye Contact:  Good  Speech:  Clear and Coherent  Volume:  Decreased  Mood:  "Okay"  Affect:  Congruent  Thought Process:  Coherent, Goal Directed and Descriptions of Associations: Circumstantial  Orientation:  Full (Time, Place, and Person)  Thought Content:  WDL  Suicidal Thoughts:  No  Homicidal Thoughts:  No  Memory:  Immediate;   Good Recent;   Good  Judgement:  Intact  Insight:  Present  Psychomotor Activity:  Normal  Concentration: Concentration: Fair and Attention Span: Fair  Recall:  Good  Fund of Knowledge:Fair  Language: Good  Akathisia:  No  Handed:  Right  AIMS (if indicated):     Assets:  Communication Skills Housing Social Support  Sleep:       Musculoskeletal: Strength & Muscle Tone: decreased Gait & Station: unsteady, had to be pushed in a wheel chair Patient leans: N/A  Blood pressure 114/69, pulse 87, temperature 98.5 F (36.9 C), temperature source Oral, resp. rate 16, SpO2 97  %.  Recommendations:  Outpatient psychiatric resources given.  Referral to social work for adult protective services report related to patient unable to care for self; roommate enables; and patient needing assisted living or nursing home placement  Based on my evaluation the patient does not appear to have an emergency medical condition.  Disposition: No evidence of imminent risk to self or others at present.   Patient does not meet criteria for psychiatric inpatient admission. Supportive therapy provided about ongoing stressors. Discussed crisis plan, support from social network, calling 911, coming to the Emergency Department, and calling Suicide Hotline.  Mauriana Dann, NP 08/10/2019, 4:08 PM

## 2019-08-10 NOTE — Progress Notes (Signed)
CSW called and made an APS report.   Drucilla Schmidt, MSW, LCSW-A Clinical Disposition Social Worker [employer] [facility]/TTS [phone]

## 2019-08-10 NOTE — BH Assessment (Signed)
BHH Assessment Progress Note Patient presents this date by [employer] mental health crisis team requesting assistance with ongoing mental and physical needs. Patient denies any S/I, H/I or AVH and was discharged on 08/06/19 after being admitted inpatient for S/I on 08/03/19. Patient is unable to follow up with aftercare or have transportation to assist with ongoing care. Patient states he resides with a roommate that he has to depend on for transportation and has limited access to the community. Patient is noted to ambulate with a cane and has problems walking. There are also questions whether or not patient can preform all of his ADL's. Patient also reports having limited support and ongoing SA issues. Patient was seen by [place] NP who recommended patient be seen by social work to address current concerns since he did not meet the criteria for a psychiatric admission.  Social work Merchandiser, retail and CSW reviewed case and decided based on current needs and lack of self care that a APS report will be initiated. This Clinical research associate spoke to patient and informed of above who contracted for safety and agreed to be transported back to his residence to await outcome. CSW to assist with filing APS report.

## 2019-08-11 ENCOUNTER — Emergency Department (HOSPITAL_COMMUNITY)
Admission: EM | Admit: 2019-08-11 | Discharge: 2019-08-12 | Disposition: A | Discharge status: Home / Self Care | Payer: Medicare HMO | Attending: Emergency Medicine | Admitting: Emergency Medicine

## 2019-08-11 DIAGNOSIS — G35 Multiple sclerosis: Secondary | ICD-10-CM | POA: Diagnosis not present

## 2019-08-11 DIAGNOSIS — Z046 Encounter for general psychiatric examination, requested by authority: Secondary | ICD-10-CM | POA: Diagnosis not present

## 2019-08-11 DIAGNOSIS — R45851 Suicidal ideations: Secondary | ICD-10-CM | POA: Diagnosis not present

## 2019-08-11 DIAGNOSIS — F314 Bipolar disorder, current episode depressed, severe, without psychotic features: Secondary | ICD-10-CM | POA: Diagnosis present

## 2019-08-11 DIAGNOSIS — F1910 Other psychoactive substance abuse, uncomplicated: Secondary | ICD-10-CM | POA: Diagnosis present

## 2019-08-11 DIAGNOSIS — F17210 Nicotine dependence, cigarettes, uncomplicated: Secondary | ICD-10-CM | POA: Insufficient documentation

## 2019-08-11 DIAGNOSIS — F329 Major depressive disorder, single episode, unspecified: Secondary | ICD-10-CM | POA: Diagnosis present

## 2019-08-11 DIAGNOSIS — Z20822 Contact with and (suspected) exposure to covid-19: Secondary | ICD-10-CM | POA: Insufficient documentation

## 2019-08-11 DIAGNOSIS — F1994 Other psychoactive substance use, unspecified with psychoactive substance-induced mood disorder: Secondary | ICD-10-CM | POA: Diagnosis present

## 2019-08-11 DIAGNOSIS — R2681 Unsteadiness on feet: Secondary | ICD-10-CM | POA: Insufficient documentation

## 2019-08-11 DIAGNOSIS — F319 Bipolar disorder, unspecified: Secondary | ICD-10-CM | POA: Diagnosis not present

## 2019-08-11 DIAGNOSIS — Z79899 Other long term (current) drug therapy: Secondary | ICD-10-CM | POA: Insufficient documentation

## 2019-08-11 LAB — CBC WITH DIFFERENTIAL/PLATELET
Abs Immature Granulocytes: 0.01 10*3/uL (ref 0.00–0.07)
Basophils Absolute: 0 10*3/uL (ref 0.0–0.1)
Basophils Relative: 1 %
Eosinophils Absolute: 0 10*3/uL (ref 0.0–0.5)
Eosinophils Relative: 1 %
HCT: 38.4 % — ABNORMAL LOW (ref 39.0–52.0)
Hemoglobin: 12.8 g/dL — ABNORMAL LOW (ref 13.0–17.0)
Immature Granulocytes: 0 %
Lymphocytes Relative: 31 %
Lymphs Abs: 0.9 10*3/uL (ref 0.7–4.0)
MCH: 32.9 pg (ref 26.0–34.0)
MCHC: 33.3 g/dL (ref 30.0–36.0)
MCV: 98.7 fL (ref 80.0–100.0)
Monocytes Absolute: 0.3 10*3/uL (ref 0.1–1.0)
Monocytes Relative: 10 %
Neutro Abs: 1.7 10*3/uL (ref 1.7–7.7)
Neutrophils Relative %: 57 %
Platelets: 155 10*3/uL (ref 150–400)
RBC: 3.89 MIL/uL — ABNORMAL LOW (ref 4.22–5.81)
RDW: 13.9 % (ref 11.5–15.5)
WBC: 2.9 10*3/uL — ABNORMAL LOW (ref 4.0–10.5)
nRBC: 0 % (ref 0.0–0.2)

## 2019-08-11 LAB — URINALYSIS, ROUTINE W REFLEX MICROSCOPIC
Bilirubin Urine: NEGATIVE
Glucose, UA: NEGATIVE mg/dL
Hgb urine dipstick: NEGATIVE
Ketones, ur: 5 mg/dL — AB
Leukocytes,Ua: NEGATIVE
Nitrite: NEGATIVE
Protein, ur: NEGATIVE mg/dL
Specific Gravity, Urine: 1.012 (ref 1.005–1.030)
pH: 5 (ref 5.0–8.0)

## 2019-08-11 LAB — COMPREHENSIVE METABOLIC PANEL
ALT: 17 U/L (ref 0–44)
AST: 20 U/L (ref 15–41)
Albumin: 3.6 g/dL (ref 3.5–5.0)
Alkaline Phosphatase: 38 U/L (ref 38–126)
Anion gap: 9 (ref 5–15)
BUN: 20 mg/dL (ref 8–23)
CO2: 21 mmol/L — ABNORMAL LOW (ref 22–32)
Calcium: 8.5 mg/dL — ABNORMAL LOW (ref 8.9–10.3)
Chloride: 107 mmol/L (ref 98–111)
Creatinine, Ser: 0.91 mg/dL (ref 0.61–1.24)
GFR calc Af Amer: >60 mL/min (ref >60)
GFR calc non Af Amer: >60 mL/min (ref >60)
Glucose, Bld: 128 mg/dL — ABNORMAL HIGH (ref 70–99)
Potassium: 4.2 mmol/L (ref 3.5–5.1)
Sodium: 137 mmol/L (ref 135–145)
Total Bilirubin: 0.4 mg/dL (ref 0.3–1.2)
Total Protein: 6.3 g/dL — ABNORMAL LOW (ref 6.5–8.1)

## 2019-08-11 LAB — RAPID URINE DRUG SCREEN, HOSP PERFORMED
Amphetamines: NOT DETECTED
Barbiturates: NOT DETECTED
Benzodiazepines: NOT DETECTED
Cocaine: NOT DETECTED
Opiates: NOT DETECTED
Tetrahydrocannabinol: POSITIVE — AB

## 2019-08-11 MED ORDER — ZOLPIDEM TARTRATE 5 MG PO TABS
5.0000 mg | ORAL_TABLET | Freq: Every evening | ORAL | Status: DC | PRN
  Filled 2019-08-11: qty 1

## 2019-08-11 MED ORDER — ACETAMINOPHEN 325 MG PO TABS: 650.0000 mg | ORAL_TABLET | ORAL | Status: DC | PRN

## 2019-08-11 MED ORDER — ONDANSETRON HCL 4 MG PO TABS: 4.0000 mg | ORAL_TABLET | Freq: Three times a day (TID) | ORAL | Status: DC | PRN
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
## 2019-08-11 NOTE — ED Triage Notes (Signed)
Pt arrived by [facility] and police.  He is here from home with S/I.  He is having difficulty taking care of himself and fell today at home.

## 2019-08-12 ENCOUNTER — Encounter (HOSPITAL_COMMUNITY): Payer: Self-pay | Admitting: Registered Nurse

## 2019-08-12 DIAGNOSIS — F1910 Other psychoactive substance abuse, uncomplicated: Secondary | ICD-10-CM

## 2019-08-12 DIAGNOSIS — R45851 Suicidal ideations: Secondary | ICD-10-CM

## 2019-08-12 DIAGNOSIS — F1994 Other psychoactive substance use, unspecified with psychoactive substance-induced mood disorder: Secondary | ICD-10-CM

## 2019-08-12 DIAGNOSIS — F314 Bipolar disorder, current episode depressed, severe, without psychotic features: Secondary | ICD-10-CM

## 2019-08-12 LAB — RESPIRATORY PANEL BY RT PCR (FLU A&B, COVID)
Influenza A by PCR: NEGATIVE
Influenza B by PCR: NEGATIVE
SARS Coronavirus 2 by RT PCR: NEGATIVE

## 2019-08-12 NOTE — ED Provider Notes (Signed)
[facility]-EMERGENCY DEPT Provider Note   CSN: [id number] Arrival date & time: 08/11/19  1631     History Chief Complaint  Patient presents with  . Suicidal    [name] is a 64 y.o. male.  Presents ER with chief complaint suicidal thoughts.  Patient states over the last couple days has been having increased suicidal thoughts.  Yesterday had thoughts about jumping off a bridge.  Did not take any action to hurt himself.  Denies any homicidal thoughts, no auditory or visual hallucinations.    Reports that today while he was getting up to walk felt unsteady and slowly fell.  His roommate was able to assist him to the ground, no trauma noted.  Did not hit his head.  Denies any neck pain, back pain, chest or abdominal pain.  HPI     Past Medical History:  Diagnosis Date  . Depression   . Mental disorder   . Movement disorder   . MS (multiple sclerosis) (HCC)   . Multiple sclerosis, relapsing-remitting (HCC)   . Neuromuscular disorder (HCC)   . Vision abnormalities     Patient Active Problem List   Diagnosis Date Noted  . Vitamin D deficiency 09/16/2018  . Altered mental status   . Acute encephalopathy   . Goals of care, counseling/discussion   . Palliative care encounter   . GI bleed 10/18/2017  . Symptomatic anemia   . Upper GI bleeding 10/16/2017  . Coccygeal pain, acute 04/08/2017  . Elevated TSH 04/06/2016  . Bipolar I disorder (HCC) 04/05/2016  . 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 Numbness 01/16/2016  . Alcohol abuse 07/16/2015  . Other fatigue 06/29/2014  . Gait disturbance 06/29/2014  . Urinary urgency 06/29/2014  . High risk medication use 06/29/2014  . MS (multiple sclerosis) (HCC) 10/31/2011    Class: Chronic  . Dysthymia 05/11/2011    Past Surgical History:  Procedure Laterality Date  . BIOPSY  10/17/2017   Procedure: BIOPSY;  Surgeon: Kathi Der, MD;  [facility];  Service:  Gastroenterology;;  . ESOPHAGOGASTRODUODENOSCOPY (EGD) WITH PROPOFOL N/A 10/17/2017   Procedure: ESOPHAGOGASTRODUODENOSCOPY (EGD) WITH PROPOFOL;  Surgeon: Kathi Der, MD;  [facility];  Service: Gastroenterology;  Laterality: N/A;  . IR ANGIOGRAM SELECTIVE EACH ADDITIONAL VESSEL  10/21/2017  . IR ANGIOGRAM SELECTIVE EACH ADDITIONAL VESSEL  10/21/2017  . IR ANGIOGRAM VISCERAL SELECTIVE  10/21/2017  . IR EMBO ART  VEN HEMORR LYMPH EXTRAV  INC GUIDE ROADMAPPING  10/21/2017  . IR US GUIDE VASC ACCESS RIGHT  10/21/2017  . TONSILLECTOMY         Family History  Problem Relation Age of Onset  . Heart disease Mother   . Stroke Father     Social History   Tobacco Use  . Smoking status: Current Every Day Smoker    Packs/day: 1.00    Years: 36.00    Pack years: 36.00    Types: Cigarettes  . Smokeless tobacco: Never Used  Substance Use Topics  . Alcohol use: Yes    Alcohol/week: 0.0 standard drinks    Comment: 11 beers a day x "many years"  . Drug use: Yes    Types: Marijuana    Comment: 1-2 times a month    Home Medications Prior to Admission medications   Medication Sig Start Date End Date Taking? Authorizing Provider  folic acid (FOLVITE) 1 MG tablet Take 1 tablet (1 mg total) by mouth daily. 09/16/18  Yes Sater, Nevin Kozuch  A, MD  OLANZapine (ZYPREXA) 10 MG tablet Take 1 tablet (10 mg total) by mouth at bedtime. 09/16/18  Yes Sater, Pearletha Furl, MD  TECFIDERA 240 MG CPDR Take 1 capsule by mouth twice daily Patient taking differently: Take 240 mg by mouth in the morning and at bedtime.  01/07/19  Yes Sater, Pearletha Furl, MD  Vitamin D, Ergocalciferol, (DRISDOL) 1.25 MG (50000 UT) CAPS capsule Take 1 capsule (50,000 Units total) by mouth every 7 (seven) days. Patient taking differently: Take 50,000 Units by mouth every Saturday.  09/18/18  Yes Sater, Pearletha Furl, MD  sertraline (ZOLOFT) 50 MG tablet Take 1 tablet (50 mg total) by mouth daily. 09/16/18   Sater, Pearletha Furl, MD    Allergies     Patient has no known allergies.  Review of Systems   Review of Systems  Constitutional: Negative for chills and fever.  HENT: Negative for ear pain and sore throat.   Eyes: Negative for pain and visual disturbance.  Respiratory: Negative for cough and shortness of breath.   Cardiovascular: Negative for chest pain and palpitations.  Gastrointestinal: Negative for abdominal pain and vomiting.  Genitourinary: Negative for dysuria and hematuria.  Musculoskeletal: Negative for arthralgias and back pain.  Skin: Negative for color change and rash.  Neurological: Negative for seizures and syncope.  Psychiatric/Behavioral: Positive for suicidal ideas.  All other systems reviewed and are negative.   Physical Exam Updated Vital Signs BP 131/82 (BP Location: Right Arm)   Pulse 60   Temp 98.2 F (36.8 C) (Oral)   Resp 16   SpO2 95%   Physical Exam Vitals and nursing note reviewed.  Constitutional:      Appearance: He is well-developed.  HENT:     Head: Normocephalic and atraumatic.  Eyes:     Conjunctiva/sclera: Conjunctivae normal.  Cardiovascular:     Rate and Rhythm: Normal rate and regular rhythm.     Heart sounds: No murmur.  Pulmonary:     Effort: Pulmonary effort is normal. No respiratory distress.     Breath sounds: Normal breath sounds.  Abdominal:     Palpations: Abdomen is soft.     Tenderness: There is no abdominal tenderness.  Musculoskeletal:        General: No deformity or signs of injury.     Cervical back: Neck supple.     Comments: Nontender to palpation throughout all 4 extremities, no tenderness over C, T, L-spine  Skin:    General: Skin is warm and dry.     Capillary Refill: Capillary refill takes less than 2 seconds.  Neurological:     General: No focal deficit present.     Mental Status: He is alert and oriented to person, place, and time.  Psychiatric:        Mood and Affect: Mood normal.        Behavior: Behavior normal.     ED Results /  Procedures / Treatments   Labs (all labs ordered are listed, but only abnormal results are displayed) Labs Reviewed  CBC WITH DIFFERENTIAL/PLATELET - Abnormal; Notable for the following components:      Result Value   WBC 2.9 (*)    RBC 3.89 (*)    Hemoglobin 12.8 (*)    HCT 38.4 (*)    All other components within normal limits  COMPREHENSIVE METABOLIC PANEL - Abnormal; Notable for the following components:   CO2 21 (*)    Glucose, Bld 128 (*)    Calcium 8.5 (*)  Total Protein 6.3 (*)    All other components within normal limits  RAPID URINE DRUG SCREEN, HOSP PERFORMED - Abnormal; Notable for the following components:   Tetrahydrocannabinol POSITIVE (*)    All other components within normal limits  URINALYSIS, ROUTINE W REFLEX MICROSCOPIC - Abnormal; Notable for the following components:   Ketones, ur 5 (*)    All other components within normal limits  RESPIRATORY PANEL BY RT PCR (FLU A&B, COVID)    EKG None  Radiology No results found.  Procedures Procedures (including critical care time)  Medications Ordered in ED Medications  acetaminophen (TYLENOL) tablet 650 mg (has no administration in time range)  zolpidem (AMBIEN) tablet 5 mg (has no administration in time range)  ondansetron (ZOFRAN) tablet 4 mg (has no administration in time range)    ED Course  I have reviewed the triage vital signs and the nursing notes.  Pertinent labs & imaging results that were available during my care of the patient were reviewed by me and considered in my medical decision making (see chart for details).    MDM Rules/Calculators/A&P                      64 year old male presenting to ER with concern for suicidal ideation.  Here well-appearing, had reported possible fall, no trauma noted on exam, no other medical concerns.  Medically cleared.  TTS recommending Geri psych placement.  Will board in ER while awaiting placement.  Final Clinical Impression(s) / ED Diagnoses Final  diagnoses:  Suicidal ideation    Rx / DC Orders ED Discharge Orders    None       Lucrezia Starch, MD 08/12/19 0040

## 2019-08-12 NOTE — BH Assessment (Signed)
[facility] Assessment Progress Note  Per Shuvon Rankin, FNP, this pt does not require psychiatric hospitalization at this time.  Pt is to be discharged from [facility] with recommendation to follow up with [facility].  A social work consult has been ordered to address pt's psychosocial needs.  Pt's nurse, Waynetta Sandy, has been notified.  [name], MA Triage Specialist [phone]

## 2019-08-12 NOTE — ED Provider Notes (Signed)
Emergency Medicine Observation Re-evaluation Note  [name] is a 64 y.o. male, seen on rounds today.  Pt initially presented to the ED for complaints of Suicidal Currently, the patient is calm and resting.  Physical Exam  BP (!) 145/82 (BP Location: Right Arm)   Pulse (!) 57   Temp 98.2 F (36.8 C) (Oral)   Resp 16   SpO2 90%  Physical Exam  ED Course / MDM  EKG:    I have reviewed the labs performed to date as well as medications administered while in observation.  Recent changes in the last 24 hours include continued medication management and behavioral health eval. Plan  Current plan is for inpatient bed search. Patient is not under full IVC at this time.  12:30 PM.  I was informed by behavioral health that the patient is cleared for discharge.  They are reaching out to social work to get PT and OT to see him to see if he is going need assistance at home.   Terrilee Files, MD 08/12/19 1754

## 2019-08-12 NOTE — Discharge Instructions (Signed)
For your mental health needs, you are advised to follow up with [facility].  Call them at your earliest opportunity to schedule an intake appointment:       [facility]      [address]      [place], [place] [address]      [phone]      Crisis number: [phone]  

## 2019-08-12 NOTE — TOC Initial Note (Signed)
Transition of Care [facility]) - Initial/Assessment Note    Patient Details  Name: [name] MRN: [id number] Date of Birth: [birth year]  Transition of Care [facility] CM/SW Contact:    Montine Circle, LCSW Phone Number: 08/12/2019, 11:50 AM  Clinical Narrative:                 Patient is a 64 yr old, male, who presented to the ED for SI and depression. Patient was evaluated by psych and psych cleared. CSW spoke with patient at bedside about his needs in caring for himself. Patient reports he thoughts he was going somewhere for psych. CSW explained that patient has been psych cleared. CSW assessed patient's ability to care for himself. Patient reports he lives at home with his friend. Patient reports he has able to get around with a his cane and reports he has a wheelchair. Patient reports he had HH in the past and declined to resume HH and reports "they made me do too many exercises." Patient denies SI.   Shortly after speaking with patient, patient asked staff to get up and was very unsteady when attempting to get up. Patient admits he is somewhat unsteady with his cane. Patient reports he is agreeable to possible SNF placement.   Expected Discharge Plan: Home/Self Care Barriers to Discharge: No Barriers Identified   Patient Goals and CMS Choice Patient states their goals for this hospitalization and ongoing recovery are:: "Go home"      Expected Discharge Plan and Services Expected Discharge Plan: Home/Self Care In-house Referral: Clinical Social Work Discharge Planning Services: CM Consult   Living arrangements for the past 2 months: Mobile Home                                      Prior Living Arrangements/Services Living arrangements for the past 2 months: Mobile Home Lives with:: Friends Patient language and need for interpreter reviewed:: Yes Do you feel safe going back to the place where you live?: Yes      Need for Family Participation in Patient Care: Yes  (Comment) Care giver support system in place?: Yes (comment) Current home services: DME Criminal Activity/Legal Involvement Pertinent to Current Situation/Hospitalization: No - Comment as needed  Activities of Daily Living Home Assistive Devices/Equipment: Eyeglasses, Wheelchair, Medical laboratory scientific officer (specify quad or straight) ADL Screening (condition at time of admission) Patient's cognitive ability adequate to safely complete daily activities?: Yes Is the patient deaf or have difficulty hearing?: No Does the patient have difficulty seeing, even when wearing glasses/contacts?: No Does the patient have difficulty concentrating, remembering, or making decisions?: No Patient able to express need for assistance with ADLs?: Yes Does the patient have difficulty dressing or bathing?: No Independently performs ADLs?: Yes (appropriate for developmental age) Does the patient have difficulty walking or climbing stairs?: Yes(Pt uses a cane.) Weakness of Legs: None Weakness of Arms/Hands: None  Permission Sought/Granted                  Emotional Assessment Appearance:: Appears older than stated age Attitude/Demeanor/Rapport: Engaged Affect (typically observed): Flat, Accepting, Adaptable Orientation: : Oriented to Self, Oriented to Place, Oriented to  Time, Oriented to Situation Alcohol / Substance Use: Not Applicable Psych Involvement: No (comment)  Admission diagnosis:  SI Patient Active Problem List   Diagnosis Date Noted  . Vitamin D deficiency 09/16/2018  . Altered mental status   . Acute encephalopathy   .  Goals of care, counseling/discussion   . Palliative care encounter   . GI bleed 10/18/2017  . Symptomatic anemia   . Upper GI bleeding 10/16/2017  . Coccygeal pain, acute 04/08/2017  . Elevated TSH 04/06/2016  . Bipolar I disorder ([place]) 04/05/2016  . 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 Numbness 01/16/2016  . Alcohol abuse 07/16/2015  . Other fatigue 06/29/2014   . Gait disturbance 06/29/2014  . Urinary urgency 06/29/2014  . High risk medication use 06/29/2014  . MS (multiple sclerosis) ([place]) 10/31/2011    Class: Chronic  . Dysthymia 05/11/2011   PCP:  Charolette Forward, MD Pharmacy:   [facility] [place], [address] [place] [address] Phone: [phone] Fax: [phone]  [place], [place] [place] [address] Phone: [phone] Fax: [phone]  [place], [place] [address] [place] [address] Phone: [phone] Fax: [phone]     Social Determinants of Health (SDOH) Interventions    Readmission Risk Interventions No flowsheet data found.

## 2019-08-12 NOTE — Consult Note (Signed)
[facility] Psych ED Discharge  08/12/2019 12:11 PM REYN FAIVRE  MRN:  [id number] Principal Problem: Substance induced mood disorder [facility]) Discharge Diagnoses: Principal Problem:   Substance induced mood disorder (HCC) Active Problems:   Polysubstance abuse (HCC)   Subjective: Gilford Raid, 64 y.o., male patient seen via tele psych by this provider, Dr. Lucianne Muss; and chart reviewed on 08/12/19.  Patient has presented to the ED and [facility] several times with in the last two weeks with the same complaint of depression related to his medical condition, falls, and unable to care for himself.  On evaluation Gilford Raid asked what brought him back to the hospital and he stated "The same old shit as last time."  Patient asked if he has been able to follow up with any resources "No."  Patient asked if anyone has been to his home to do an assessment "No."  Patient lives with a room mate who he states brings him beer and weed; states that he is unable to clean the house and needs very little assistance putting on cloths; but is at higher risk of falling.  States that he fell yesterday.  Reports that he uses a cane at home; but at hospital patient is unable to walk without shuffling his feet and difficult with balance; having to use wheel chair.  At patients last visit to [facility] it was recommended that social work consult with DSS to give an APS referral.  At this time patient denies suicidal/self-harm/homicidal ideation, psychosis, and paranoia.  He does state that he is interested in assisted living or a nursing.  Patient continues to endorse depression related to his medical condition.    During evaluation M[place]A is alert/oriented x 4; calm/cooperative; and mood is congruent with affect.  He does not appear to be responding to internal/external stimuli or delusional thoughts.  Patient denies suicidal/self-harm/homicidal ideation, psychosis, and paranoia.  Patient answered question appropriately.    Total Time  spent with patient: 30 minutes  Past Psychiatric History: See below  Past Medical History:  Past Medical History:  Diagnosis Date  . Depression   . Mental disorder   . Movement disorder   . MS (multiple sclerosis) (HCC)   . Multiple sclerosis, relapsing-remitting (HCC)   . Neuromuscular disorder (HCC)   . Vision abnormalities     Past Surgical History:  Procedure Laterality Date  . BIOPSY  10/17/2017   Procedure: BIOPSY;  Surgeon: Kathi Der, MD;  Location: [facility];  Service: Gastroenterology;;  . ESOPHAGOGASTRODUODENOSCOPY (EGD) WITH PROPOFOL N/A 10/17/2017   Procedure: ESOPHAGOGASTRODUODENOSCOPY (EGD) WITH PROPOFOL;  Surgeon: Kathi Der, MD;  Location: [facility];  Service: Gastroenterology;  Laterality: N/A;  . IR ANGIOGRAM SELECTIVE EACH ADDITIONAL VESSEL  10/21/2017  . IR ANGIOGRAM SELECTIVE EACH ADDITIONAL VESSEL  10/21/2017  . IR ANGIOGRAM VISCERAL SELECTIVE  10/21/2017  . IR EMBO ART  VEN HEMORR LYMPH EXTRAV  INC GUIDE ROADMAPPING  10/21/2017  . IR US GUIDE VASC ACCESS RIGHT  10/21/2017  . TONSILLECTOMY     Family History:  Family History  Problem Relation Age of Onset  . Heart disease Mother   . Stroke Father    Family Psychiatric  History: Unaware Social History:  Social History   Substance and Sexual Activity  Alcohol Use Yes  . Alcohol/week: 0.0 standard drinks   Comment: 11 beers a day x "many years"     Social History   Substance and Sexual Activity  Drug Use Yes  .  Types: Marijuana   Comment: 1-2 times a month    Social History   Socioeconomic History  . Marital status: Legally Separated    Spouse name: Not on file  . Number of children: Not on file  . Years of education: Not on file  . Highest education level: Not on file  Occupational History  . Not on file  Tobacco Use  . Smoking status: Current Every Day Smoker    Packs/day: 1.00    Years: 36.00    Pack years: 36.00    Types: Cigarettes  . Smokeless tobacco: Never Used   Substance and Sexual Activity  . Alcohol use: Yes    Alcohol/week: 0.0 standard drinks    Comment: 11 beers a day x "many years"  . Drug use: Yes    Types: Marijuana    Comment: 1-2 times a month  . Sexual activity: Not Currently    Birth control/protection: None  Other Topics Concern  . Not on file  Social History Narrative   ** Merged History Encounter **       Social Determinants of Health   Financial Resource Strain:   . Difficulty of Paying Living Expenses:   Food Insecurity:   . Worried About Charity fundraiser in the Last Year:   . Arboriculturist in the Last Year:   Transportation Needs:   . Film/video editor (Medical):   [name] Lack of Transportation (Non-Medical):   Physical Activity:   . Days of Exercise per Week:   . Minutes of Exercise per Session:   Stress:   . Feeling of Stress :   Social Connections:   . Frequency of Communication with Friends and Family:   . Frequency of Social Gatherings with Friends and Family:   . Attends Religious Services:   . Active Member of Clubs or Organizations:   . Attends Archivist Meetings:   [name] Marital Status:     Has this patient used any form of tobacco in the last 30 days? (Cigarettes, Smokeless Tobacco, Cigars, and/or Pipes) A prescription for an FDA-approved tobacco cessation medication was offered at discharge and the patient refused  Current Medications: Current Facility-Administered Medications  Medication Dose Route Frequency Provider Last Rate Last Admin  . acetaminophen (TYLENOL) tablet 650 mg  650 mg Oral Q4H PRN Lucrezia Starch, MD      . ondansetron [facility]) tablet 4 mg  4 mg Oral Q8H PRN Lucrezia Starch, MD      . zolpidem (AMBIEN) tablet 5 mg  5 mg Oral QHS PRN Lucrezia Starch, MD       Current Outpatient Medications  Medication Sig Dispense Refill  . folic acid (FOLVITE) 1 MG tablet Take 1 tablet (1 mg total) by mouth daily. 90 tablet 3  . OLANZapine (ZYPREXA) 10 MG tablet Take 1  tablet (10 mg total) by mouth at bedtime. 90 tablet 3  . TECFIDERA 240 MG CPDR Take 1 capsule by mouth twice daily (Patient taking differently: Take 240 mg by mouth in the morning and at bedtime. ) 60 capsule 11  . Vitamin D, Ergocalciferol, (DRISDOL) 1.25 MG (50000 UT) CAPS capsule Take 1 capsule (50,000 Units total) by mouth every 7 (seven) days. (Patient taking differently: Take 50,000 Units by mouth every Saturday. ) 13 capsule 4  . sertraline (ZOLOFT) 50 MG tablet Take 1 tablet (50 mg total) by mouth daily. 90 tablet 3   PTA Medications: (Not in a hospital admission)  Musculoskeletal: Strength & Muscle Tone: Did not see ambulate this visit Gait & Station: unsteady, shuffle Patient leans: N/A  Psychiatric Specialty Exam: Physical Exam Vitals and nursing note reviewed. Exam conducted with a chaperone present.  Pulmonary:     Effort: Pulmonary effort is normal.  Neurological:     Mental Status: He is alert.  Psychiatric:        Attention and Perception: Attention and perception normal.        Mood and Affect: Depressed: stable.        Speech: Speech normal.        Behavior: Behavior normal. Behavior is cooperative.        Thought Content: Thought content is not paranoid or delusional. Thought content does not include homicidal or suicidal ideation.        Cognition and Memory: Cognition and memory normal.        Judgment: Judgment is impulsive.     Review of Systems  Psychiatric/Behavioral: Negative for hallucinations, self-injury, sleep disturbance and suicidal ideas. Nervous/anxious: Stable.        Patient reporting depression and unable to do for himself.  States that hs no transportation to get to where he needs to go.  States he has not follow up with any psychiatric services.    All other systems reviewed and are negative.   Blood pressure (!) 145/82, pulse (!) 57, temperature 98.2 F (36.8 C), temperature source Oral, resp. rate 16, SpO2 90 %.There is no height or  weight on file to calculate BMI.  General Appearance: Casual and Disheveled  Eye Contact:  Good  Speech:  Clear and Coherent and Normal Rate  Volume:  Decreased  Mood:  Depressed  Affect:  Appropriate and Congruent  Thought Process:  Coherent, Goal Directed and Descriptions of Associations: Intact  Orientation:  Full (Time, Place, and Person)  Thought Content:  WDL  Suicidal Thoughts:  No  Homicidal Thoughts:  No  Memory:  Immediate;   Good Recent;   Good  Judgement:  Fair  Insight:  Fair and Present  Psychomotor Activity:  Decreased  Concentration:  Concentration: Good and Attention Span: Good  Recall:  Good  Fund of Knowledge:  Good  Language:  Good  Akathisia:  No  Handed:  Right  AIMS (if indicated):     Assets:  Communication Skills Desire for Improvement Housing  ADL's:  Intact  Cognition:  WNL  Sleep:        Demographic Factors:  Male and Caucasian  Loss Factors: Decline in physical health  Historical Factors: Impulsivity  Risk Reduction Factors:   Religious beliefs about death  Continued Clinical Symptoms:  Alcohol/Substance Abuse/Dependencies Previous Psychiatric Diagnoses and Treatments  Cognitive Features That Contribute To Risk:  None    Suicide Risk:  Minimal: No identifiable suicidal ideation.  Patients presenting with no risk factors but with morbid ruminations; may be classified as minimal risk based on the severity of the depressive symptoms    Plan Of Care/Follow-up recommendations:  Activity:  As tolerated Diet:  Heart healthy    Discharge Instructions     For your mental health needs, you are advised to follow up with [facility].  Call them at your earliest opportunity to schedule an intake appointment:       [facility]      [address]      [place], [place] [address]      [phone]      Crisis number: [phone]  Disposition:  Psychiatrically cleared No evidence of imminent risk to self or others at present.    Patient does not meet criteria for psychiatric inpatient admission. Supportive therapy provided about ongoing stressors. Discussed crisis plan, support from social network, calling 911, coming to the Emergency Department, and calling Suicide Hotline.   Spoke with Dr. Charm Barges and informed of recommendation/disposition.  Social work and PT attempting to see patient at this time. Dr. Charm Barges will complete discharge disposition once everyone has seen and assessed.   Brantleigh Mifflin, NP 08/12/2019, 12:11 PM

## 2019-08-12 NOTE — Progress Notes (Signed)
CSW spoke with patient again about going to SNF. Per staff, patient has been continuously asking about going home. CSW explained the process for getting into SNF takes at least 1-2 days. Patient reports he did not want to wait that long for placement and wanted to go home. When weighing the options, patient was agreeable to going home with [facility] (PT, OT, RN, nurse aide, and SW). CSW again assessed patient's living situation and patient expressed no concerns.   CSW received call from patient's roommate, [name], who reports he has been caring for patient. Patient's roommate reports he is able to pick patient up and ensure he gets into the home safely. Patient was agreeable to have his roommate pick him up.   Geralyn Corwin, LCSW Transitions of Care Department [facility] ED [phone]

## 2019-08-12 NOTE — Progress Notes (Signed)
TOC CM spoke to [name] and he does want [facility]. Contacted [facility] rep, [place]. States start of care will be on 08/15/2019. [name] agreeable. States he has cane and Rollator at home. Isidoro Donning RN CCM, [facility] ED TOC CM [phone]

## 2019-08-12 NOTE — ED Notes (Signed)
Pt DC d off unit to home per provider. Pt alert, cooperative, calm, no s/s distress. DC information and resources given to reviewed with pt.  Belongings given to pt. Pt used w/c to leave unit, escorted by NT and RN. Pt transported by friend.

## 2019-08-13 ENCOUNTER — Encounter (HOSPITAL_COMMUNITY): Payer: Self-pay | Admitting: *Deleted

## 2019-08-13 ENCOUNTER — Emergency Department (HOSPITAL_COMMUNITY)
Admission: EM | Admit: 2019-08-13 | Discharge: 2019-08-17 | Disposition: A | Discharge status: Home / Self Care | Payer: Medicare HMO | Attending: Emergency Medicine | Admitting: Emergency Medicine

## 2019-08-13 DIAGNOSIS — F17210 Nicotine dependence, cigarettes, uncomplicated: Secondary | ICD-10-CM | POA: Diagnosis not present

## 2019-08-13 DIAGNOSIS — G35 Multiple sclerosis: Secondary | ICD-10-CM | POA: Diagnosis not present

## 2019-08-13 DIAGNOSIS — F329 Major depressive disorder, single episode, unspecified: Secondary | ICD-10-CM | POA: Diagnosis present

## 2019-08-13 DIAGNOSIS — F1994 Other psychoactive substance use, unspecified with psychoactive substance-induced mood disorder: Secondary | ICD-10-CM | POA: Insufficient documentation

## 2019-08-13 DIAGNOSIS — Z046 Encounter for general psychiatric examination, requested by authority: Secondary | ICD-10-CM | POA: Insufficient documentation

## 2019-08-13 DIAGNOSIS — Z79899 Other long term (current) drug therapy: Secondary | ICD-10-CM | POA: Diagnosis not present

## 2019-08-13 DIAGNOSIS — R45851 Suicidal ideations: Secondary | ICD-10-CM | POA: Diagnosis not present

## 2019-08-13 DIAGNOSIS — R410 Disorientation, unspecified: Secondary | ICD-10-CM | POA: Diagnosis not present

## 2019-08-13 DIAGNOSIS — F1910 Other psychoactive substance abuse, uncomplicated: Secondary | ICD-10-CM | POA: Diagnosis not present

## 2019-08-13 DIAGNOSIS — F314 Bipolar disorder, current episode depressed, severe, without psychotic features: Secondary | ICD-10-CM | POA: Diagnosis not present

## 2019-08-13 LAB — CBC WITH DIFFERENTIAL/PLATELET
Abs Immature Granulocytes: 0.01 10*3/uL (ref 0.00–0.07)
Basophils Absolute: 0 10*3/uL (ref 0.0–0.1)
Basophils Relative: 1 %
Eosinophils Absolute: 0 10*3/uL (ref 0.0–0.5)
Eosinophils Relative: 1 %
HCT: 40.6 % (ref 39.0–52.0)
Hemoglobin: 13.3 g/dL (ref 13.0–17.0)
Immature Granulocytes: 0 %
Lymphocytes Relative: 18 %
Lymphs Abs: 0.7 10*3/uL (ref 0.7–4.0)
MCH: 32.5 pg (ref 26.0–34.0)
MCHC: 32.8 g/dL (ref 30.0–36.0)
MCV: 99.3 fL (ref 80.0–100.0)
Monocytes Absolute: 0.4 10*3/uL (ref 0.1–1.0)
Monocytes Relative: 11 %
Neutro Abs: 2.6 10*3/uL (ref 1.7–7.7)
Neutrophils Relative %: 69 %
Platelets: 145 10*3/uL — ABNORMAL LOW (ref 150–400)
RBC: 4.09 MIL/uL — ABNORMAL LOW (ref 4.22–5.81)
RDW: 13.7 % (ref 11.5–15.5)
WBC: 3.7 10*3/uL — ABNORMAL LOW (ref 4.0–10.5)
nRBC: 0 % (ref 0.0–0.2)

## 2019-08-13 LAB — COMPREHENSIVE METABOLIC PANEL
ALT: 15 U/L (ref 0–44)
AST: 16 U/L (ref 15–41)
Albumin: 3.6 g/dL (ref 3.5–5.0)
Alkaline Phosphatase: 38 U/L (ref 38–126)
Anion gap: 9 (ref 5–15)
BUN: 18 mg/dL (ref 8–23)
CO2: 22 mmol/L (ref 22–32)
Calcium: 8.4 mg/dL — ABNORMAL LOW (ref 8.9–10.3)
Chloride: 107 mmol/L (ref 98–111)
Creatinine, Ser: 0.97 mg/dL (ref 0.61–1.24)
GFR calc Af Amer: >60 mL/min (ref >60)
GFR calc non Af Amer: >60 mL/min (ref >60)
Glucose, Bld: 76 mg/dL (ref 70–99)
Potassium: 4.3 mmol/L (ref 3.5–5.1)
Sodium: 138 mmol/L (ref 135–145)
Total Bilirubin: 0.4 mg/dL (ref 0.3–1.2)
Total Protein: 6.2 g/dL — ABNORMAL LOW (ref 6.5–8.1)

## 2019-08-13 LAB — URINALYSIS, ROUTINE W REFLEX MICROSCOPIC
Bilirubin Urine: NEGATIVE
Glucose, UA: NEGATIVE mg/dL
Hgb urine dipstick: NEGATIVE
Ketones, ur: NEGATIVE mg/dL
Leukocytes,Ua: NEGATIVE
Nitrite: NEGATIVE
Protein, ur: NEGATIVE mg/dL
Specific Gravity, Urine: 1.015 (ref 1.005–1.030)
pH: 5 (ref 5.0–8.0)

## 2019-08-13 LAB — RAPID URINE DRUG SCREEN, HOSP PERFORMED
Amphetamines: NOT DETECTED
Barbiturates: NOT DETECTED
Benzodiazepines: NOT DETECTED
Cocaine: NOT DETECTED
Opiates: NOT DETECTED
Tetrahydrocannabinol: POSITIVE — AB

## 2019-08-13 LAB — ETHANOL: Alcohol, Ethyl (B): 33 mg/dL — ABNORMAL HIGH (ref <10)

## 2019-08-13 LAB — ACETAMINOPHEN LEVEL: Acetaminophen (Tylenol), Serum: <10 ug/mL — ABNORMAL LOW (ref 10–30)

## 2019-08-13 LAB — SALICYLATE LEVEL: Salicylate Lvl: <7 mg/dL — ABNORMAL LOW (ref 7.0–30.0)

## 2019-08-13 MED ORDER — OLANZAPINE 10 MG PO TABS
10.0000 mg | ORAL_TABLET | Freq: Every day | ORAL | Status: DC
  Administered 2019-08-13 – 2019-08-16 (×4): 10 mg via ORAL
  Filled 2019-08-13 (×4): qty 1

## 2019-08-13 MED ORDER — THIAMINE HCL 100 MG PO TABS
100.0000 mg | ORAL_TABLET | Freq: Every day | ORAL | Status: DC
  Administered 2019-08-13 – 2019-08-17 (×5): 100 mg via ORAL
  Filled 2019-08-13 (×5): qty 1

## 2019-08-13 MED ORDER — LORAZEPAM 2 MG/ML IJ SOLN
0.0000 mg | Freq: Four times a day (QID) | INTRAMUSCULAR | Status: AC
  Administered 2019-08-14: 2 mg via INTRAVENOUS

## 2019-08-13 MED ORDER — LORAZEPAM 1 MG PO TABS: 0.0000 mg | ORAL_TABLET | Freq: Two times a day (BID) | ORAL | Status: DC

## 2019-08-13 MED ORDER — THIAMINE HCL 100 MG/ML IJ SOLN: 100.0000 mg | Freq: Every day | INTRAMUSCULAR | Status: DC

## 2019-08-13 MED ORDER — SERTRALINE HCL 50 MG PO TABS
50.0000 mg | ORAL_TABLET | Freq: Every day | ORAL | Status: DC
  Administered 2019-08-13 – 2019-08-17 (×5): 50 mg via ORAL
  Filled 2019-08-13 (×5): qty 1

## 2019-08-13 MED ORDER — LORAZEPAM 1 MG PO TABS
0.0000 mg | ORAL_TABLET | Freq: Four times a day (QID) | ORAL | Status: AC
  Administered 2019-08-13 – 2019-08-14 (×2): 2 mg via ORAL
  Administered 2019-08-14: 1 mg via ORAL
  Filled 2019-08-13: qty 2
  Filled 2019-08-13: qty 1
  Filled 2019-08-13 (×2): qty 2

## 2019-08-13 MED ORDER — VITAMIN D (ERGOCALCIFEROL) 1.25 MG (50000 UNIT) PO CAPS
50000.0000 [IU] | ORAL_CAPSULE | ORAL | Status: DC
  Administered 2019-08-15: 50000 [IU] via ORAL
  Filled 2019-08-13: qty 1

## 2019-08-13 MED ORDER — LORAZEPAM 2 MG/ML IJ SOLN: 0.0000 mg | Freq: Two times a day (BID) | INTRAMUSCULAR | Status: DC

## 2019-08-13 MED ORDER — FOLIC ACID 1 MG PO TABS
1.0000 mg | ORAL_TABLET | Freq: Every day | ORAL | Status: DC
  Administered 2019-08-13 – 2019-08-17 (×5): 1 mg via ORAL
  Filled 2019-08-13 (×5): qty 1

## 2019-08-13 MED ORDER — DIMETHYL FUMARATE 240 MG PO CPDR: 240.0000 mg | DELAYED_RELEASE_CAPSULE | Freq: Two times a day (BID) | ORAL | Status: DC

## 2019-08-13 NOTE — BH Assessment (Signed)
Tele Assessment Note   Patient Name: [name] MRN: [id number] Referring Physician: Dr. Pricilla Loveless Location of Patient: [place] Location of Provider: Behavioral Health TTS Department  [name] is an 64 y.o. male.  -Clinician reviewed note from Dr. Criss Alvine.  Pt is a 64 year old male presents with suicidal thoughts.  This is a recurring issue and has been present for the last week or so.  He was discharged from the hospital this morning.  States he is not better.  Has plan to jump off a bridge and/or overdose on sleeping pills.  He denies other medical complaints such as fever, cough, vomiting, chest pain.  Drinks 3 - 25 ounce beers per day.  A review of patient's chart shows that patient has been in the ED twice this week.  He was at [facility] from April 12-15.  He was assessed on Tuesday evening (04/20).  He had a psychiatric evaluation on 04/21 and was recommended to follow up with [facility].  Patient today was at home and called [employer] with complaint of suicidal thoughts with plan to jump from a bridge.  Clinician asked why patient had not gone over to [facility] as recommended and patient responded, "Nobody likes [facility]."  Patient has no known history of following up with outpatient resources that are provided.    Clinician asked patient if he still felt suicidal.  Pt responds "A little less than before but I don't feel safe."  Pt cites that he has had 3-4 suicide attempts in the past.  Patient denies any HI or A/V hallucinations.  Patient reports that he uses 1-2 bowls of marijuana daily and usually three 25oz beers per day also.  Pt BAL was 33 at 14:55.  Patient is positive for THC.  Patient asks during assessment "what are the odds that I can be admitted over there."  Clinician informed him that he may be observed overnight probably.  Patient has a flat affect which is congruent w/ his depression.  Pt has fair eye contact and is oriented x4.  Patient is not responding to internal stimuli.  His  thought process is logical and coherent.  -Clinician discussed patient care with Renaye Rakers, NP who recommends observation overnight and AM psych evaluation for patient.  Clinician informed PA Misty Stanley) at [facility] of disposition.  Diagnosis: Substance induced mood d/o  Past Medical History:  Past Medical History:  Diagnosis Date  . Depression   . Mental disorder   . Movement disorder   . MS (multiple sclerosis) (HCC)   . Multiple sclerosis, relapsing-remitting (HCC)   . Neuromuscular disorder (HCC)   . Vision abnormalities     Past Surgical History:  Procedure Laterality Date  . BIOPSY  10/17/2017   Procedure: BIOPSY;  Surgeon: Kathi Der, MD;  [facility];  Service: Gastroenterology;;  . ESOPHAGOGASTRODUODENOSCOPY (EGD) WITH PROPOFOL N/A 10/17/2017   Procedure: ESOPHAGOGASTRODUODENOSCOPY (EGD) WITH PROPOFOL;  Surgeon: Kathi Der, MD;  [facility];  Service: Gastroenterology;  Laterality: N/A;  . IR ANGIOGRAM SELECTIVE EACH ADDITIONAL VESSEL  10/21/2017  . IR ANGIOGRAM SELECTIVE EACH ADDITIONAL VESSEL  10/21/2017  . IR ANGIOGRAM VISCERAL SELECTIVE  10/21/2017  . IR EMBO ART  VEN HEMORR LYMPH EXTRAV  INC GUIDE ROADMAPPING  10/21/2017  . IR US GUIDE VASC ACCESS RIGHT  10/21/2017  . TONSILLECTOMY      Family History:  Family History  Problem Relation Age of Onset  . Heart disease Mother   . Stroke Father     Social  History:  reports that he has been smoking cigarettes. He has a 36.00 pack-year smoking history. He has never used smokeless tobacco. He reports current alcohol use. He reports current drug use. Drug: Marijuana.  Additional Social History:  Alcohol / Drug Use Pain Medications: See d/c med list from [facility] on 08/06/19 Prescriptions: See d/c med list from [facility] on 08/06/19 Over the Counter: See d/c med list from [facility] on 08/06/19 History of alcohol / drug use?: Yes Substance #1 Name of Substance 1: ETOH (beer) 1 - Age of First Use: 64 years of age 47  - Amount (size/oz): Three 25 oz beers 1 - Frequency: Daily 1 - Duration: ongoing 1 - Last Use / Amount: 08/13/19 Substance #2 Name of Substance 2: Marijuana 2 - Age of First Use: teens 2 - Amount (size/oz): 1-2 bowls per day 2 - Frequency: daily use 2 - Duration: ongoing 2 - Last Use / Amount: 08/12/19  CIWA: CIWA-Ar BP: 134/84 Pulse Rate: 84 Nausea and Vomiting: no nausea and no vomiting Tactile Disturbances: none Tremor: not visible, but can be felt fingertip to fingertip Auditory Disturbances: not present Paroxysmal Sweats: no sweat visible Visual Disturbances: not present Anxiety: no anxiety, at ease Headache, Fullness in Head: none present Agitation: normal activity Orientation and Clouding of Sensorium: oriented and can do serial additions CIWA-Ar Total: 1 COWS:    Allergies: No Known Allergies  Home Medications: (Not in a hospital admission)   OB/GYN Status:  No LMP for male patient.  General Assessment Data [facility] of [facility]: [facility] TTS Assessment: In system Is this a Tele or Face-to-Face Assessment?: Tele Assessment Is this an Initial Assessment or a Re-assessment for this encounter?: Initial Assessment Patient Accompanied by:: N/A Language Other than English: No Living Arrangements: Other (Comment)(Has a roommate/) What gender do you identify as?: Male Marital status: Divorced Pregnancy Status: No Living Arrangements: Non-relatives/Friends Can pt return to current living arrangement?: Yes Admission Status: Voluntary Is patient capable of signing voluntary admission?: Yes Referral Source: Self/Family/Friend(Pt called police.) Insurance type: [facility]     Crisis Care Plan Living Arrangements: Non-relatives/Friends Name of Psychiatrist: None Name of Therapist: None  Education Status Is patient currently in school?: No Is the patient employed, unemployed or receiving disability?: Receiving disability income  Risk to self with the past 6  months Suicidal Ideation: No-Not Currently/Within Last 6 Months(Pt does not trust himself to stay safe.) Has patient been a risk to self within the past 6 months prior to admission? : Yes Suicidal Intent: No-Not Currently/Within Last 6 Months Has patient had any suicidal intent within the past 6 months prior to admission? : Yes Is patient at risk for suicide?: Yes Suicidal Plan?: Yes-Currently Present Has patient had any suicidal plan within the past 6 months prior to admission? : Yes Specify Current Suicidal Plan: (Jump from a bridge or "swallow all my sleeping pills.") Access to Means: Yes Specify Access to Suicidal Means: medications, bridges What has been your use of drugs/alcohol within the last 12 months?: ETOH & THC Previous Attempts/Gestures: Yes How many times?: (3-4 attempts.) Triggers for Past Attempts: Other (Comment)(Depression) Intentional Self Injurious Behavior: None Family Suicide History: No Recent stressful life event(s): Other (Comment)(Pt cannot identify a stressor) Persecutory voices/beliefs?: No Depression: Yes Depression Symptoms: Despondent, Isolating, Loss of interest in usual pleasures, Feeling worthless/self pity, Insomnia Substance abuse history and/or treatment for substance abuse?: Yes Suicide prevention information given to non-admitted patients: Not applicable  Risk to Others within the past 6 months Homicidal Ideation:  No Does patient have any lifetime risk of violence toward others beyond the six months prior to admission? : No Thoughts of Harm to Others: No Current Homicidal Intent: No Current Homicidal Plan: No Access to Homicidal Means: No Identified Victim: No one History of harm to others?: No Assessment of Violence: None Noted Violent Behavior Description: Denies Does patient have access to weapons?: No Criminal Charges Pending?: No Does patient have a court date: No Is patient on probation?: No  Psychosis Hallucinations: None  noted Delusions: None noted  Mental Status Report Appearance/Hygiene: In scrubs Eye Contact: Fair Motor Activity: Freedom of movement, Unsteady Speech: Logical/coherent, Soft Level of Consciousness: Quiet/awake Mood: Depressed, Sad Affect: Depressed Anxiety Level: None Thought Processes: Coherent, Relevant Judgement: Impaired Orientation: Person, Situation, Place, Time Obsessive Compulsive Thoughts/Behaviors: None  Cognitive Functioning Concentration: Normal Memory: Recent Impaired, Remote Intact Is patient IDD: No Insight: Fair Impulse Control: Fair Appetite: Fair Have you had any weight changes? : No Change Sleep: No Change Total Hours of Sleep: (8-10 hours) Vegetative Symptoms: None  ADLScreening [facility] Assessment Services) Patient's cognitive ability adequate to safely complete daily activities?: Yes Patient able to express need for assistance with ADLs?: Yes Independently performs ADLs?: Yes (appropriate for developmental age)  Prior Inpatient Therapy Prior Inpatient Therapy: Yes Prior Therapy Dates: 04/12-04/15 '21 Prior Therapy Facilty/Provider(s): [facility] Reason for Treatment: SI  Prior Outpatient Therapy Prior Outpatient Therapy: No Does patient have an ACCT team?: No Does patient have Intensive In-House Services?  : No Does patient have [facility] services? : No Does patient have P4CC services?: No  ADL Screening (condition at time of admission) Patient's cognitive ability adequate to safely complete daily activities?: Yes Is the patient deaf or have difficulty hearing?: No Does the patient have difficulty seeing, even when wearing glasses/contacts?: No(Wears glasses.) Does the patient have difficulty concentrating, remembering, or making decisions?: No Patient able to express need for assistance with ADLs?: Yes Does the patient have difficulty dressing or bathing?: No Independently performs ADLs?: Yes (appropriate for developmental age) Does the patient have  difficulty walking or climbing stairs?: Yes Weakness of Legs: Both Weakness of Arms/Hands: None       Abuse/Neglect Assessment (Assessment to be complete while patient is alone) Abuse/Neglect Assessment Can Be Completed: Yes Physical Abuse: Denies Verbal Abuse: Yes, past (Comment) Sexual Abuse: Denies Exploitation of patient/patient's resources: Denies Self-Neglect: Denies     Regulatory affairs officer (For Healthcare) Does Patient Have a Medical Advance Directive?: No Would patient like information on creating a medical advance directive?: No - Patient declined          Disposition:  Disposition Initial Assessment Completed for this Encounter: Yes  This service was provided via telemedicine using a 2-way, interactive audio and video technology.  Names of all persons participating in this telemedicine service and their role in this encounter. Name: [name] Role: patient  Name: Curlene Dolphin, M.S. LCAS QP Role: clinician  Name:  Role:   Name:  Role:     Raymondo Band 08/13/2019 10:31 PM

## 2019-08-13 NOTE — ED Notes (Signed)
Pt resting in bed comfortably.

## 2019-08-13 NOTE — ED Provider Notes (Addendum)
[facility]-EMERGENCY DEPT Provider Note   CSN: [id number] Arrival date & time: 08/13/19  1402     History Chief Complaint  Patient presents with  . Suicidal    [name] is a 64 y.o. male.  HPI 64 year old male presents with suicidal thoughts.  This is a recurring issue and has been present for the last week or so.  He was discharged from the hospital this morning.  States he is not better.  Has plan to jump off a bridge and/or overdose on sleeping pills.  He denies other medical complaints such as fever, cough, vomiting, chest pain.  Drinks 3 - 25 ounce beers per day.   Past Medical History:  Diagnosis Date  . Depression   . Mental disorder   . Movement disorder   . MS (multiple sclerosis) (HCC)   . Multiple sclerosis, relapsing-remitting (HCC)   . Neuromuscular disorder (HCC)   . Vision abnormalities     Patient Active Problem List   Diagnosis Date Noted  . Polysubstance abuse (HCC) 08/12/2019  . Substance induced mood disorder (HCC) 08/12/2019  . Vitamin D deficiency 09/16/2018  . Altered mental status   . Acute encephalopathy   . Goals of care, counseling/discussion   . Palliative care encounter   . GI bleed 10/18/2017  . Symptomatic anemia   . Upper GI bleeding 10/16/2017  . Coccygeal pain, acute 04/08/2017  . Elevated TSH 04/06/2016  . Bipolar I disorder (HCC) 04/05/2016  . 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 Numbness 01/16/2016  . Alcohol abuse 07/16/2015  . Other fatigue 06/29/2014  . Gait disturbance 06/29/2014  . Urinary urgency 06/29/2014  . High risk medication use 06/29/2014  . MS (multiple sclerosis) (HCC) 10/31/2011    Class: Chronic  . Dysthymia 05/11/2011    Past Surgical History:  Procedure Laterality Date  . BIOPSY  10/17/2017   Procedure: BIOPSY;  Surgeon: Kathi Der, MD;  [facility];  Service: Gastroenterology;;  . ESOPHAGOGASTRODUODENOSCOPY (EGD) WITH PROPOFOL N/A  10/17/2017   Procedure: ESOPHAGOGASTRODUODENOSCOPY (EGD) WITH PROPOFOL;  Surgeon: Kathi Der, MD;  [facility];  Service: Gastroenterology;  Laterality: N/A;  . IR ANGIOGRAM SELECTIVE EACH ADDITIONAL VESSEL  10/21/2017  . IR ANGIOGRAM SELECTIVE EACH ADDITIONAL VESSEL  10/21/2017  . IR ANGIOGRAM VISCERAL SELECTIVE  10/21/2017  . IR EMBO ART  VEN HEMORR LYMPH EXTRAV  INC GUIDE ROADMAPPING  10/21/2017  . IR US GUIDE VASC ACCESS RIGHT  10/21/2017  . TONSILLECTOMY         Family History  Problem Relation Age of Onset  . Heart disease Mother   . Stroke Father     Social History   Tobacco Use  . Smoking status: Current Every Day Smoker    Packs/day: 1.00    Years: 36.00    Pack years: 36.00    Types: Cigarettes  . Smokeless tobacco: Never Used  Substance Use Topics  . Alcohol use: Yes    Alcohol/week: 0.0 standard drinks    Comment: 11 beers a day x "many years"  . Drug use: Yes    Types: Marijuana    Comment: 1-2 times a month    Home Medications Prior to Admission medications   Medication Sig Start Date End Date Taking? Authorizing Provider  folic acid (FOLVITE) 1 MG tablet Take 1 tablet (1 mg total) by mouth daily. 09/16/18   Sater, Pearletha Furl, MD  OLANZapine (ZYPREXA) 10 MG tablet Take 1 tablet (10 mg total) by  mouth at bedtime. 09/16/18   Sater, Pearletha Furl, MD  sertraline (ZOLOFT) 50 MG tablet Take 1 tablet (50 mg total) by mouth daily. 09/16/18   Sater, Pearletha Furl, MD  TECFIDERA 240 MG CPDR Take 1 capsule by mouth twice daily Patient taking differently: Take 240 mg by mouth in the morning and at bedtime.  01/07/19   Sater, Pearletha Furl, MD  Vitamin D, Ergocalciferol, (DRISDOL) 1.25 MG (50000 UT) CAPS capsule Take 1 capsule (50,000 Units total) by mouth every 7 (seven) days. Patient taking differently: Take 50,000 Units by mouth every Saturday.  09/18/18   Sater, Pearletha Furl, MD    Allergies    Patient has no known allergies.  Review of Systems   Review of Systems    Constitutional: Negative for fever.  Respiratory: Negative for cough and shortness of breath.   Cardiovascular: Negative for chest pain.  Gastrointestinal: Negative for vomiting.  Neurological: Negative for headaches.  Psychiatric/Behavioral: Positive for suicidal ideas.  All other systems reviewed and are negative.   Physical Exam Updated Vital Signs BP 125/81 (BP Location: Right Arm)   Pulse 81   Temp 97.7 F (36.5 C) (Oral)   Resp 16   SpO2 96%   Physical Exam Vitals and nursing note reviewed.  Constitutional:      General: He is not in acute distress.    Appearance: He is well-developed. He is not ill-appearing or diaphoretic.  HENT:     Head: Normocephalic and atraumatic.     Right Ear: External ear normal.     Left Ear: External ear normal.     Nose: Nose normal.  Eyes:     General:        Right eye: No discharge.        Left eye: No discharge.  Cardiovascular:     Rate and Rhythm: Normal rate and regular rhythm.     Heart sounds: Normal heart sounds.  Pulmonary:     Effort: Pulmonary effort is normal.     Breath sounds: Normal breath sounds.  Abdominal:     Palpations: Abdomen is soft.     Tenderness: There is no abdominal tenderness.  Musculoskeletal:     Cervical back: Neck supple.  Skin:    General: Skin is warm and dry.  Neurological:     Mental Status: He is alert.  Psychiatric:        Mood and Affect: Mood is not anxious. Affect is flat.        Thought Content: Thought content includes suicidal ideation. Thought content includes suicidal plan.     ED Results / Procedures / Treatments   Labs (all labs ordered are listed, but only abnormal results are displayed) Labs Reviewed - No data to display  EKG None  Radiology No results found.  Procedures Procedures (including critical care time)  Medications Ordered in ED Medications - No data to display  ED Course  I have reviewed the triage vital signs and the nursing notes.  Pertinent  labs & imaging results that were available during my care of the patient were reviewed by me and considered in my medical decision making (see chart for details).    MDM Rules/Calculators/A&P                      Patient with recurrent SI.  Was seen here 2 days ago and discharged yesterday after observation.  Will reconsult psychiatry.  Medical screening labs ordered but with normal vital signs  I have low suspicion these will be concerning.  The patient has been placed in psychiatric observation due to the need to provide a safe environment for the patient while obtaining psychiatric consultation and evaluation, as well as ongoing medical and medication management to treat the patient's condition.  The patient has not been placed under full IVC at this time.  Final Clinical Impression(s) / ED Diagnoses Final diagnoses:  Suicidal ideation    Rx / DC Orders ED Discharge Orders    None       Sherwood Gambler, MD 08/13/19 1517    Sherwood Gambler, MD 08/13/19 [phone]

## 2019-08-13 NOTE — ED Triage Notes (Signed)
Pt brought in by police for suicidal ideation. He has plan to jump off bridge or overdose on pills. He also drank 3 "tall boys" today.

## 2019-08-14 NOTE — BH Assessment (Signed)
[facility] Assessment Progress Note  Per Berneice Heinrich, FNP, this pt does not require psychiatric hospitalization at this time. Pt is psychiatrically cleared. Discharge instructions advise pt to follow up with [facility].  A social work consult has been ordered to address pt's psychosocial needs. Pt's nurse, Addison Naegeli, has been notified.   [name], MA  Triage Specialist  [phone]

## 2019-08-14 NOTE — NC FL2 (Signed)
[place] LEVEL OF CARE SCREENING TOOL     IDENTIFICATION  Patient Name: [name] Birthdate: 19-Dec-1955 Sex: male Admission Date (Current Location): 08/13/2019  [facility] and [place] Number:  Herbalist and Address:  [facility],  [place] [address] [place]      Provider Number: [phone]  Attending Physician Name and Address:  Default, Provider, MD  Relative Name and Phone Number:  Marcelene [facility]) [phone]    Current Level of Care: Hospital Recommended Level of Care: [place] Prior Approval Number:    Date Approved/Denied:   PASRR Number: Pending  Discharge Plan: SNF    Current Diagnoses: Patient Active Problem List   Diagnosis Date Noted  . Polysubstance abuse ([place]) 08/12/2019  . Substance induced mood disorder ([place]) 08/12/2019  . Vitamin D deficiency 09/16/2018  . Altered mental status   . Acute encephalopathy   . Goals of care, counseling/discussion   . Palliative care encounter   . GI bleed 10/18/2017  . Symptomatic anemia   . Upper GI bleeding 10/16/2017  . Coccygeal pain, acute 04/08/2017  . Elevated TSH 04/06/2016  . Bipolar I disorder ([place]) 04/05/2016  . 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 Numbness 01/16/2016  . Alcohol abuse 07/16/2015  . Other fatigue 06/29/2014  . Gait disturbance 06/29/2014  . Urinary urgency 06/29/2014  . High risk medication use 06/29/2014  . MS (multiple sclerosis) ([place]) 10/31/2011  . Dysthymia 05/11/2011    Orientation RESPIRATION BLADDER Height & Weight     Self, Time, Situation, Place  Normal Continent Weight:   Height:     BEHAVIORAL SYMPTOMS/MOOD NEUROLOGICAL BOWEL NUTRITION STATUS      Continent Diet(regular)  AMBULATORY STATUS COMMUNICATION OF NEEDS Skin   Limited Assist Verbally Normal                       Personal Care Assistance Level of Assistance  Bathing, Feeding, Dressing Bathing Assistance: Limited  assistance Feeding assistance: Independent Dressing Assistance: Limited assistance     Functional Limitations Info  Sight, Hearing, Speech Sight Info: Adequate Hearing Info: Adequate Speech Info: Adequate    SPECIAL CARE FACTORS FREQUENCY  PT (By licensed PT), OT (By licensed OT)     PT Frequency: 5x weekly OT Frequency: 5x weekly            Contractures Contractures Info: Not present    Additional Factors Info  Allergies, Code Status Code Status Info: Full Allergies Info: NKA           Current Medications (08/14/2019):  This is the current hospital active medication list Current Facility-Administered Medications  Medication Dose Route Frequency Provider Last Rate Last Admin  . Dimethyl Fumarate CPDR 240 mg  240 mg Oral BID Sherwood Gambler, MD      . folic acid (FOLVITE) tablet 1 mg  1 mg Oral Daily Sherwood Gambler, MD   1 mg at 08/14/19 1011  . LORazepam (ATIVAN) injection 0-4 mg  0-4 mg Intravenous Q6H Sherwood Gambler, MD   2 mg at 08/14/19 0407   Or  . LORazepam (ATIVAN) tablet 0-4 mg  0-4 mg Oral Q6H Sherwood Gambler, MD   1 mg at 08/14/19 1014  . [START ON 08/15/2019] LORazepam (ATIVAN) injection 0-4 mg  0-4 mg Intravenous Q12H Sherwood Gambler, MD       Or  . Derrill Memo ON 08/15/2019] LORazepam (ATIVAN) tablet 0-4 mg  0-4 mg Oral Q12H Sherwood Gambler,  MD      . OLANZapine (ZYPREXA) tablet 10 mg  10 mg Oral QHS Pricilla Loveless, MD   10 mg at 08/13/19 2047  . sertraline (ZOLOFT) tablet 50 mg  50 mg Oral Daily Pricilla Loveless, MD   50 mg at 08/14/19 1011  . thiamine tablet 100 mg  100 mg Oral Daily Pricilla Loveless, MD   100 mg at 08/14/19 1011   Or  . thiamine (B-1) injection 100 mg  100 mg Intravenous Daily Pricilla Loveless, MD      . Melene Muller ON 08/15/2019] Vitamin D (Ergocalciferol) (DRISDOL) capsule 50,000 Units  50,000 Units Oral Q Sat Pricilla Loveless, MD       Current Outpatient Medications  Medication Sig Dispense Refill  . folic acid (FOLVITE) 1 MG tablet Take 1  tablet (1 mg total) by mouth daily. 90 tablet 3  . OLANZapine (ZYPREXA) 10 MG tablet Take 1 tablet (10 mg total) by mouth at bedtime. 90 tablet 3  . sertraline (ZOLOFT) 50 MG tablet Take 1 tablet (50 mg total) by mouth daily. 90 tablet 3  . TECFIDERA 240 MG CPDR Take 1 capsule by mouth twice daily (Patient taking differently: Take 240 mg by mouth in the morning and at bedtime. ) 60 capsule 11  . Vitamin D, Ergocalciferol, (DRISDOL) 1.25 MG (50000 UT) CAPS capsule Take 1 capsule (50,000 Units total) by mouth every 7 (seven) days. (Patient taking differently: Take 50,000 Units by mouth every Saturday. ) 13 capsule 4     Discharge Medications: Please see discharge summary for a list of discharge medications.  Relevant Imaging Results:  Relevant Lab Results:   Additional Information SS#: [id number]  Montine Circle, LCSW

## 2019-08-14 NOTE — Evaluation (Signed)
Physical Therapy Evaluation Patient Details Name: [name] MRN: [id number] DOB: 1956/04/19 Today's Date: 08/14/2019   History of Present Illness  64 yo male with significant history of MS , bipolar, neuromuscular disorder, movement disorder, depression and came to ED 08/13/2019 by police with complaints of SI. Pt was at [facility] and was sent to the ED on 08/04/2019 due to not being able to care for himself at [facility]. He then was seen ambualting and walking to /from bathroom and DC home from ED in a cab on 08/06/2019. Then he returned to the ED on 4/20 with SI thoughts, frustrations with not being able to care for himself and  a fall. He then was DC from ED on 08/12/19 Alert and oriented and ambulating with a cane and supervision. He returned to the ED on 08/13/2019 by the police with SI again and same reasons as before on 08/11/2019.    Clinical Impression  Pt pleasant and cooperative during the session today. Pt presents with limited ability to have conversation and present PLOF, was asking for me to help him open the door to get cat food for his cat. He is Alert to self and also his roommates' name, but not to his current location. His overall processing, motor planning seemed very delayed, he was "pill rolling a lot" and movement patterns very rigid and stiff with festinating gait , small base of support and difficulty initiating any mobility. His food tray was in front of him and seemed he had trouble feeding himself, food all in his bed. His patterns seemed "parkinson like".   At this time pt would need 24/7 assistance at mod-Max level. Recommend ST-SNF at this time is patient agreeable, if not recommend [facility] services for PT/OT and aide if possible.     Follow Up Recommendations SNF    Equipment Recommendations  Rolling walker with 5" wheels    Recommendations for Other Services       Precautions / Restrictions Precautions Precautions: Fall Precaution Comments: pt reports that he has been falling at  home Restrictions Weight Bearing Restrictions: No      Mobility  Bed Mobility Overal bed mobility: Needs Assistance Bed Mobility: Supine to Sit;Sit to Supine     Supine to sit: Max assist Sit to supine: Max assist   General bed mobility comments: no terribly difficult to assist patietn due to his stiffness, I was able to assist him as one entire body unit with the bed pad under him. He required verbal cues and tactile cues to assist minimally. seeemed motor planning and processing was very delayed.  Transfers Overall transfer level: Needs assistance Equipment used: 4-wheeled walker Transfers: Sit to/from Stand Sit to Stand: Max assist;+2 safety/equipment         General transfer comment: intiall R lateral lean in sitting EOB with R lateral lean improving a little with standing. pt with small BOS and steps with rigid and stopped movment patterns.  Ambulation/Gait Ambulation/Gait assistance: Mod assist;Max assist;+2 safety/equipment Gait Distance (Feet): 25 Feet Assistive device: Rolling walker (2 wheeled) Gait Pattern/deviations: Step-through pattern;Narrow base of support;Shuffle;Festinating     General Gait Details: very difficlut for patient to widen base of support or intiate stepping pattern , very fesinating gait and shuffled pattern, "parkinson like"  Stairs            Wheelchair Mobility    Modified Rankin (Stroke Patients Only)       Balance Overall balance assessment: Needs assistance Sitting-balance support: Bilateral upper extremity supported  Sitting balance-Leahy Scale: Poor Sitting balance - Comments: leaning to the Right and unable to self correct, but improved with tactile cuing and functional sitting pattern changed. Postural control: Right lateral lean Standing balance support: Bilateral upper extremity supported Standing balance-Leahy Scale: Poor Standing balance comment: very Small base of support with no righting reactions or compensatory  reactions, very stiff and festinating gait, with lateral leans occassionally                             Pertinent Vitals/Pain Pain Assessment: Faces Faces Pain Scale: No hurt    Home Living Family/patient expects to be discharged to:: Private residence(mobile home) Living Arrangements: Other (Comment)(Lives with his friend Remo Lipps who states he helps take care of him) Available Help at Discharge: Friend(s) Type of Home: Mobile home Home Access: Stairs to enter   [employer] of Steps: 4-5 at the front and back ( unsure of rails or not) Home Layout: One level Home Equipment: Cane - single point      Prior Function Level of Independence: Independent with assistive device(s)         Comments: Pt stated ( however not really clear) that he was able to move about and walk until a few weeks ago with a cane.     Hand Dominance        Extremity/Trunk Assessment        Lower Extremity Assessment Lower Extremity Assessment: Generalized weakness(difficult to assess fully, noted to have some ROM limitations and tightness with knee extension, very stiff with movement. and functional wekaness was great in his trunk , UEs and LEs.)       Communication   Communication: Other (comment)(very hard to understand the patietn today, mumbled words, not always coherent or pretenent to question)  Cognition Arousal/Alertness: Awake/alert(however staring out into space alot with not real focus for close up or far away itmes. Unsure how well pt can see.) Behavior During Therapy: Flat affect Overall Cognitive Status: Difficult to assess                                 General Comments: seemed a bit flat to me, starring out into space, could not attend to conversasion 100% , but cooperative and worked with PT. Seemed ability to process was imparied and for motor planning as well. Alert only to self, and knew his roomates and ccat's name, but not sure where he was  .      General Comments      Exercises     Assessment/Plan    PT Assessment Patient needs continued PT services  PT Problem List Decreased strength;Decreased activity tolerance;Decreased balance;Decreased mobility;Decreased coordination       PT Treatment Interventions DME instruction;Gait training;Functional mobility training;Therapeutic activities;Therapeutic exercise;Neuromuscular re-education    PT Goals (Current goals can be found in the Care Plan section)  Acute Rehab PT Goals Patient Stated Goal: I don't understand why I can't do anything PT Goal Formulation: Patient unable to participate in goal setting Time For Goal Achievement: 08/28/19 Potential to Achieve Goals: Good    Frequency Min 3X/week   Barriers to discharge   unsur of how much care his friend/roomate wu=ould be able to provide for him. At this point he need mod/Max assist with all ADLS and mobility 88/4.    Co-evaluation  AM-PAC PT "6 Clicks" Mobility  Outcome Measure Help needed turning from your back to your side while in a flat bed without using bedrails?: A Lot Help needed moving from lying on your back to sitting on the side of a flat bed without using bedrails?: A Lot Help needed moving to and from a bed to a chair (including a wheelchair)?: A Lot Help needed standing up from a chair using your arms (e.g., wheelchair or bedside chair)?: A Lot Help needed to walk in hospital room?: A Lot Help needed climbing 3-5 steps with a railing? : Total 6 Click Score: 11    End of Session Equipment Utilized During Treatment: Gait belt Activity Tolerance: Patient tolerated treatment well(pleasant and coorperative with session today) Patient left: in bed;with call bell/phone within reach;with nursing/sitter in room Nurse Communication: Mobility status PT Visit Diagnosis: Unsteadiness on feet (R26.81);Muscle weakness (generalized) (M62.81);Repeated falls (R29.6);History of falling  (Z91.81)    Time: 1630-1710 PT Time Calculation (min) (ACUTE ONLY): 40 min   Charges:   PT Evaluation $PT Eval Moderate Complexity: 1 Mod PT Treatments $Gait Training: 8-22 mins        Clois Dupes, PT, MPT Acute Rehabilitation Services Office: [phone] 08/14/2019   Marella Bile 08/14/2019, 5:49 PM

## 2019-08-14 NOTE — TOC Initial Note (Signed)
Transition of Care [facility]) - Initial/Assessment Note    Patient Details  Name: [name] MRN: [id number] Date of Birth: 13-Sep-1955  Transition of Care [facility]) CM/SW Contact:    Janace Hoard, LCSW Phone Number: 08/14/2019, 11:35 AM  Clinical Narrative:                 [facility] consult received regarding patient needing further assistance at home. Patient has been psych cleared. CSW spoke with TTS provider who reports patient continues to have SI due to not being able to care for himself at home. Patient declined to go to SNF just days ago, but in speaking with patient today, patient is now agreeable and understands the length of time it takes for placement. Patient reports he does not have enough income for ALF or Medicaid. CSW requested from EDP an order to have PT come see patient.  Expected Discharge Plan: Skilled Nursing Facility Barriers to Discharge: No Barriers Identified   Patient Goals and CMS Choice Patient states their goals for this hospitalization and ongoing recovery are:: "I guess I don't have a choice but to go (to SNF)." CMS Medicare.gov Compare Post Acute Care list provided to:: Patient Choice offered to / list presented to : Patient  Expected Discharge Plan and Services Expected Discharge Plan: [place] In-house Referral: Clinical Social Work Discharge Planning Services: CM Consult Post Acute Care Choice: [place] Living arrangements for the past 2 months: Mobile Home                                      Prior Living Arrangements/Services Living arrangements for the past 2 months: Mobile Home Lives with:: Friends Patient language and need for interpreter reviewed:: Yes Do you feel safe going back to the place where you live?: Yes      Need for Family Participation in Patient Care: Yes (Comment) Care giver support system in place?: Yes (comment) Current home services: DME Criminal Activity/Legal Involvement Pertinent to  Current Situation/Hospitalization: No - Comment as needed  Activities of Daily Living   ADL Screening (condition at time of admission) Patient's cognitive ability adequate to safely complete daily activities?: Yes Is the patient deaf or have difficulty hearing?: No Does the patient have difficulty seeing, even when wearing glasses/contacts?: No(Wears glasses.) Does the patient have difficulty concentrating, remembering, or making decisions?: No Patient able to express need for assistance with ADLs?: Yes Does the patient have difficulty dressing or bathing?: No Independently performs ADLs?: Yes (appropriate for developmental age) Does the patient have difficulty walking or climbing stairs?: Yes Weakness of Legs: Both Weakness of Arms/Hands: None  Permission Sought/Granted Permission sought to share information with : Chartered certified accountant granted to share information with : Yes, Verbal Permission Granted     Permission granted to share info w AGENCY: SNFs        Emotional Assessment Appearance:: Appears older than stated age Attitude/Demeanor/Rapport: Engaged Affect (typically observed): Flat Orientation: : Oriented to Self, Oriented to Place, Oriented to  Time, Oriented to Situation Alcohol / Substance Use: Not Applicable Psych Involvement: No (comment)  Admission diagnosis:  si Patient Active Problem List   Diagnosis Date Noted  . Polysubstance abuse ([place]) 08/12/2019  . Substance induced mood disorder ([place]) 08/12/2019  . Vitamin D deficiency 09/16/2018  . Altered mental status   . Acute encephalopathy   . Goals of care, counseling/discussion   .  Palliative care encounter   . GI bleed 10/18/2017  . Symptomatic anemia   . Upper GI bleeding 10/16/2017  . Coccygeal pain, acute 04/08/2017  . Elevated TSH 04/06/2016  . Bipolar I disorder (HCC) 04/05/2016  . 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 Numbness 01/16/2016  . Alcohol abuse  07/16/2015  . Other fatigue 06/29/2014  . Gait disturbance 06/29/2014  . Urinary urgency 06/29/2014  . High risk medication use 06/29/2014  . MS (multiple sclerosis) (HCC) 10/31/2011    Class: Chronic  . Dysthymia 05/11/2011   PCP:  Rinaldo Cloud, MD Pharmacy:   [facility] [phone] - [place], [place] - [phone] AT [facility] OF [address] & [address] [place] [address] Phone: [phone] Fax: [phone]  [facility] - [place], [place] - [address] [place] [address] Phone: [phone] Fax: [phone]  [facility] - [place], [place] - [address] [place] [address] Phone: [phone] Fax: [phone]     Social Determinants of Health (SDOH) Interventions    Readmission Risk Interventions No flowsheet data found.

## 2019-08-14 NOTE — Progress Notes (Signed)
08/14/2019  1248  PT called back stating they will let their boss know that patient needs to be seen today.

## 2019-08-14 NOTE — Discharge Instructions (Signed)
For your mental health needs, you are advised to follow up with [facility].  Call them at your earliest opportunity to schedule an intake appointment:       [facility]      [address]      [place], [place] [address]      [phone]      Crisis number: [phone]  

## 2019-08-14 NOTE — Progress Notes (Signed)
MCCSW faxed info to various SNFs in the local area.    Shella Spearing MSW LCSWA Transitions of Care  Clinical Social Worker  [facility] Emergency Departments  [phone]

## 2019-08-14 NOTE — ED Notes (Signed)
This patient is much weaker than last time I saw him,  Earlier this week.  He is also very confused.  He does remember that his MS medication is at home with his roommate .  He has urinated on himself today so we put a condom catheter on him.

## 2019-08-14 NOTE — Consult Note (Signed)
[facility] Psych ED Discharge  08/14/2019 10:36 AM [name]  MRN:  [id number] Principal Problem: <principal problem not specified> Discharge Diagnoses: Active Problems:   * No active hospital problems. *   Subjective: Patient assessed by nurse practitioner along with Dr. Dwyane Dee.  Patient alert and oriented, answers appropriately. Patient reports "I would not feel suicidal if I had somewhere safe to live." Patient endorses chronic suicidal ideation with a plan to jump off a bridge "for years."  Patient denies homicidal ideations. Patient denies auditory visual hallucinations. Patient denies symptoms of paranoia. Patient reports he lives in [place] with a roommate. Patient reports periodically difficult relationship with roommate. Patient states "sometimes he helps me but right now he is not." Patient endorses alcohol and marijuana use. Patient denies access to weapons. Patient endorses compliance with home medications at this time. Patient verbalizes "I would like to go to a facility where they can help take care of me."  Total Time spent with patient: 30 minutes  Past Psychiatric History: Alcohol use disorder, bipolar 1 disorder, substance-induced mood disorder  Past Medical History:  Past Medical History:  Diagnosis Date  . Depression   . Mental disorder   . Movement disorder   . MS (multiple sclerosis) ([place])   . Multiple sclerosis, relapsing-remitting ([place])   . Neuromuscular disorder ([place])   . Vision abnormalities     Past Surgical History:  Procedure Laterality Date  . BIOPSY  10/17/2017   Procedure: BIOPSY;  Surgeon: Otis Brace, MD;  Location: [facility];  Service: Gastroenterology;;  . ESOPHAGOGASTRODUODENOSCOPY (EGD) WITH PROPOFOL N/A 10/17/2017   Procedure: ESOPHAGOGASTRODUODENOSCOPY (EGD) WITH PROPOFOL;  Surgeon: Otis Brace, MD;  Location: [facility];  Service: Gastroenterology;  Laterality: N/A;  . IR ANGIOGRAM SELECTIVE EACH ADDITIONAL VESSEL  10/21/2017  .  IR ANGIOGRAM SELECTIVE EACH ADDITIONAL VESSEL  10/21/2017  . IR ANGIOGRAM VISCERAL SELECTIVE  10/21/2017  . IR EMBO ART  VEN HEMORR LYMPH EXTRAV  INC GUIDE ROADMAPPING  10/21/2017  . IR US GUIDE VASC ACCESS RIGHT  10/21/2017  . TONSILLECTOMY     Family History:  Family History  Problem Relation Age of Onset  . Heart disease Mother   . Stroke Father    Family Psychiatric  History: Unknown Social History:  Social History   Substance and Sexual Activity  Alcohol Use Yes  . Alcohol/week: 0.0 standard drinks   Comment: 11 beers a day x "many years"     Social History   Substance and Sexual Activity  Drug Use Yes  . Types: Marijuana   Comment: 1-2 times a month    Social History   Socioeconomic History  . Marital status: Legally Separated    Spouse name: Not on file  . Number of children: Not on file  . Years of education: Not on file  . Highest education level: Not on file  Occupational History  . Not on file  Tobacco Use  . Smoking status: Current Every Day Smoker    Packs/day: 1.00    Years: 36.00    Pack years: 36.00    Types: Cigarettes  . Smokeless tobacco: Never Used  Substance and Sexual Activity  . Alcohol use: Yes    Alcohol/week: 0.0 standard drinks    Comment: 11 beers a day x "many years"  . Drug use: Yes    Types: Marijuana    Comment: 1-2 times a month  . Sexual activity: Not Currently    Birth control/protection: None  Other Topics Concern  .  Not on file  Social History Narrative   ** Merged History Encounter **       Social Determinants of Health   Financial Resource Strain:   . Difficulty of Paying Living Expenses:   Food Insecurity:   . Worried About Programme researcher, broadcasting/film/video in the Last Year:   . Barista in the Last Year:   Transportation Needs:   . Freight forwarder (Medical):   [name] Lack of Transportation (Non-Medical):   Physical Activity:   . Days of Exercise per Week:   . Minutes of Exercise per Session:   Stress:   . Feeling of  Stress :   Social Connections:   . Frequency of Communication with Friends and Family:   . Frequency of Social Gatherings with Friends and Family:   . Attends Religious Services:   . Active Member of Clubs or Organizations:   . Attends Banker Meetings:   [name] Marital Status:     Has this patient used any form of tobacco in the last 30 days? (Cigarettes, Smokeless Tobacco, Cigars, and/or Pipes) A prescription for an FDA-approved tobacco cessation medication was offered at discharge and the patient refused  Current Medications: Current Facility-Administered Medications  Medication Dose Route Frequency Provider Last Rate Last Admin  . Dimethyl Fumarate CPDR 240 mg  240 mg Oral BID Pricilla Loveless, MD      . folic acid (FOLVITE) tablet 1 mg  1 mg Oral Daily Pricilla Loveless, MD   1 mg at 08/14/19 1011  . LORazepam (ATIVAN) injection 0-4 mg  0-4 mg Intravenous Q6H Pricilla Loveless, MD   2 mg at 08/14/19 0407   Or  . LORazepam (ATIVAN) tablet 0-4 mg  0-4 mg Oral Q6H Pricilla Loveless, MD   1 mg at 08/14/19 1014  . [START ON 08/15/2019] LORazepam (ATIVAN) injection 0-4 mg  0-4 mg Intravenous Q12H Pricilla Loveless, MD       Or  . Melene Muller ON 08/15/2019] LORazepam (ATIVAN) tablet 0-4 mg  0-4 mg Oral Q12H Pricilla Loveless, MD      . OLANZapine (ZYPREXA) tablet 10 mg  10 mg Oral QHS Pricilla Loveless, MD   10 mg at 08/13/19 2047  . sertraline (ZOLOFT) tablet 50 mg  50 mg Oral Daily Pricilla Loveless, MD   50 mg at 08/14/19 1011  . thiamine tablet 100 mg  100 mg Oral Daily Pricilla Loveless, MD   100 mg at 08/14/19 1011   Or  . thiamine (B-1) injection 100 mg  100 mg Intravenous Daily Pricilla Loveless, MD      . Melene Muller ON 08/15/2019] Vitamin D (Ergocalciferol) (DRISDOL) capsule 50,000 Units  50,000 Units Oral Q Sat Pricilla Loveless, MD       Current Outpatient Medications  Medication Sig Dispense Refill  . folic acid (FOLVITE) 1 MG tablet Take 1 tablet (1 mg total) by mouth daily. 90 tablet 3  .  OLANZapine (ZYPREXA) 10 MG tablet Take 1 tablet (10 mg total) by mouth at bedtime. 90 tablet 3  . sertraline (ZOLOFT) 50 MG tablet Take 1 tablet (50 mg total) by mouth daily. 90 tablet 3  . TECFIDERA 240 MG CPDR Take 1 capsule by mouth twice daily (Patient taking differently: Take 240 mg by mouth in the morning and at bedtime. ) 60 capsule 11  . Vitamin D, Ergocalciferol, (DRISDOL) 1.25 MG (50000 UT) CAPS capsule Take 1 capsule (50,000 Units total) by mouth every 7 (seven) days. (Patient taking differently: Take 50,000  Units by mouth every Saturday. ) 13 capsule 4   PTA Medications: (Not in a hospital admission)   Musculoskeletal: Strength & Muscle Tone: Unable to assess Gait & Station: Unable to assess Patient leans: N/A  Psychiatric Specialty Exam: Physical Exam Vitals and nursing note reviewed.  Constitutional:      Appearance: He is well-developed.  HENT:     Head: Normocephalic.  Cardiovascular:     Rate and Rhythm: Normal rate.  Pulmonary:     Effort: Pulmonary effort is normal.  Neurological:     Mental Status: He is alert and oriented to person, place, and time.  Psychiatric:        Attention and Perception: Attention normal.        Mood and Affect: Mood normal.        Speech: Speech normal.        Behavior: Behavior normal.        Thought Content: Thought content normal.        Cognition and Memory: Cognition normal.        Judgment: Judgment normal.     Review of Systems  Constitutional: Negative.   HENT: Negative.   Eyes: Negative.   Respiratory: Negative.   Cardiovascular: Negative.   Gastrointestinal: Negative.   Genitourinary: Negative.   Musculoskeletal: Negative.   Skin: Negative.   Neurological: Negative.   Psychiatric/Behavioral: Negative.     Blood pressure (!) 141/83, pulse 74, temperature 97.7 F (36.5 C), temperature source Axillary, resp. rate 16, SpO2 98 %.There is no height or weight on file to calculate BMI.  General Appearance: Casual and  Fairly Groomed  Eye Contact:  Good  Speech:  Clear and Coherent and Normal Rate  Volume:  Normal  Mood:  Euthymic  Affect:  Appropriate and Congruent  Thought Process:  Coherent, Goal Directed and Descriptions of Associations: Intact  Orientation:  Full (Time, Place, and Person)  Thought Content:  WDL and Logical  Suicidal Thoughts:  No  Homicidal Thoughts:  No  Memory:  Immediate;   Good Recent;   Good Remote;   Good  Judgement:  Fair  Insight:  Fair  Psychomotor Activity:  Normal  Concentration:  Concentration: Good and Attention Span: Good  Recall:  Good  Fund of Knowledge:  Good  Language:  Good  Akathisia:  No  Handed:  Right  AIMS (if indicated):     Assets:  Communication Skills Desire for Improvement Financial Resources/Insurance Housing Intimacy Leisure Time Physical Health Resilience Social Support  ADL's:  Impaired  Cognition:  WNL  Sleep:        Demographic Factors:  Male and Caucasian  Loss Factors: NA  Historical Factors: NA  Risk Reduction Factors:   Living with another person, especially a relative, Positive social support, Positive therapeutic relationship and Positive coping skills or problem solving skills  Continued Clinical Symptoms:  Alcohol/Substance Abuse/Dependencies  Cognitive Features That Contribute To Risk:  None    Suicide Risk:  Minimal: No identifiable suicidal ideation.  Patients presenting with no risk factors but with morbid ruminations; may be classified as minimal risk based on the severity of the depressive symptoms    Plan Of Care/Follow-up recommendations:  Other:  Social work consult ordered  Patient reports he is unable to care for himself adequately, patient request placement options.  Disposition: Patient cleared by psychiatry, social work involved. Patrcia Dolly, FNP 08/14/2019, 10:36 AM

## 2019-08-14 NOTE — Progress Notes (Signed)
08/14/2019  1200 Paged PT [phone] to see when they can come to evaluate patient. Waiting for response.

## 2019-08-16 NOTE — Progress Notes (Addendum)
1:30p Still no bed offers. CSW attempted to call SNF facilities, but did not receive an answer (left VM for call back) or patient will have to be reviewed by administration. Barriers include: recent SI/MH concerns, possibility of not being able to care for self returning home from rehab. CSW will follow up with facilities during the week.   10:00a [facility] declined patient.  9:05a CSW received message from patient's roommate stating patient has been to [facility] in the past for SNF. Per chart review, patient was d/c from the hospital to [facility] in 2019. CSW contacted [facility] about accepting patient again. CSW waiting to hear back from [facility] on extending a bed offer. At this time, patient has no other bed offers.   CSW also contacted [facility] and patient is managed by them  Geralyn Corwin, LCSW Transitions of Care Department [facility] ED [phone]

## 2019-08-17 NOTE — Progress Notes (Signed)
Physical Therapy Treatment Patient Details Name: [name] MRN: [id number] DOB: 1955-10-04 Today's Date: 08/17/2019    History of Present Illness 64 yo male with significant history of MS , bipolar, neuromuscular disorder, movement disorder, depression and came to ED 4/62/7035 by police with complaints of SI. Pt was at [facility] and was sent to the ED on 08/04/2019 due to not being able to care for himself at [facility]. He then was seen ambualting and walking to /from bathroom and DC home from ED in a cab on 08/06/2019. Then he returned to the ED on 4/20 with SI thoughts, frustrations with not being able to care for himself and  a fall. He then was DC from ED on 08/12/19 Alert and oriented and ambulating with a cane and supervision. He returned to the ED on 0/12/3816 by the police with SI again and same reasons as before on 08/11/2019.    PT Comments    The patient is  Mobilizing somewhat better than previous encounter. Patient continues to require assistance of 1 for mobility and  Will need 2 for ambulation. Continue PT for progressive mobility.  Follow Up Recommendations  SNF     Equipment Recommendations  Rolling walker with 5" wheels    Recommendations for Other Services       Precautions / Restrictions Precautions Precautions: Fall Precaution Comments: needs a depends on    Mobility  Bed Mobility Overal bed mobility: Needs Assistance Bed Mobility: Supine to Sit;Sit to Supine;Rolling Rolling: Min guard   Supine to sit: Min assist Sit to supine: Mod assist   General bed mobility comments: extra  time to roll, multimodal cues to reach for the rail, rolling each way, but completes without assistance. min assist to move legs over bed edge. pt. states" I can't". 1 HHA to sit upright.. Mod assist with legs back onto bed.frequently states" I can't" when asked to move/scoot.  Transfers Overall transfer level: Needs assistance Equipment used: Rolling walker (2 wheeled) Transfers: Sit to/from  Stand Sit to Stand: Mod assist         General transfer comment: multimodal cues to stand at RW, posterior leaning initially. Took very small shuffle staeps along bed edge x 4 '. festinating.  Ambulation/Gait             General Gait Details: attempted forward steps but very festinating so did not venture farther   [employer]    Modified Rankin (Stroke Patients Only)       Balance Overall balance assessment: Needs assistance Sitting-balance support: Bilateral upper extremity supported Sitting balance-Leahy Scale: Fair Sitting balance - Comments: sitting at midline and not leaning backwards   Standing balance support: Bilateral upper extremity supported Standing balance-Leahy Scale: Poor Standing balance comment: very Small base of support , forward flexed posture, posterior and festinating with attempts to take steps                            Cognition Arousal/Alertness: Awake/alert Behavior During Therapy: Flat affect Overall Cognitive Status: Impaired/Different from baseline Area of Impairment: Orientation;Following commands;Safety/judgement;Awareness                 Orientation Level: Time;Situation     Following Commands: Follows one step commands with increased time Safety/Judgement: Decreased awareness of safety;Decreased awareness of deficits Awareness: Emergent   General Comments: pt states" It's embarrasing to wear a diaper.[name]  This is for the birds. I appreciate the effort'      Exercises General Exercises - Lower Extremity Long Arc Quad: AROM;5 reps;Seated;Both Hip Flexion/Marching: AROM;5 reps;Seated;Both    General Comments        Pertinent Vitals/Pain Faces Pain Scale: No hurt    Home Living                      Prior Function            PT Goals (current goals can now be found in the care plan section) Progress towards PT goals: Progressing toward goals     Frequency    Min 2X/week      PT Plan Current plan remains appropriate;Frequency needs to be updated    Co-evaluation              AM-PAC PT "6 Clicks" Mobility   Outcome Measure  Help needed turning from your back to your side while in a flat bed without using bedrails?: A Lot Help needed moving from lying on your back to sitting on the side of a flat bed without using bedrails?: A Lot Help needed moving to and from a bed to a chair (including a wheelchair)?: A Lot Help needed standing up from a chair using your arms (e.g., wheelchair or bedside chair)?: A Lot Help needed to walk in hospital room?: Total Help needed climbing 3-5 steps with a railing? : Total 6 Click Score: 10    End of Session   Activity Tolerance: Patient tolerated treatment well Patient left: in bed;with call bell/phone within reach;with nursing/sitter in room Nurse Communication: Mobility status PT Visit Diagnosis: Unsteadiness on feet (R26.81);Muscle weakness (generalized) (M62.81);Repeated falls (R29.6);History of falling (Z91.81)     Time: 0998-3382 PT Time Calculation (min) (ACUTE ONLY): 25 min  Charges:  $Gait Training: 8-22 mins $Therapeutic Exercise: 8-22 mins                     Blanchard Kelch PT Acute Rehabilitation Services Pager [phone] Office [phone]    Rada Hay 08/17/2019, 9:19 AM

## 2019-08-17 NOTE — Progress Notes (Signed)
CSW spoke with patient's roommate, Viviann Spare, at length about patient coming home. CSW discussed with Viviann Spare that patient will follow up with [facility]. We also discussed how to best support patient. Viviann Spare reports he agrees that patient as well as himself should stop drinking and we discussed a plan to help the both of them decrease their use (AA meetings, OP MH, working with the [facility] SW). Patient can be transported via [place] and Viviann Spare reports he will be home to receive patient.   Geralyn Corwin, LCSW Transitions of Care Department [facility] ED [phone]

## 2019-08-17 NOTE — ED Provider Notes (Signed)
Patient has been psychiatrically cleared and is awaiting placement.  On my assessment today, patient has no complaints.  He is resting comfortably.  He denies any new shortness of breath, chest pain, cough, or other complaints.  Patient still awaiting placement.  Lungs are clear and chest is nontender.  Patient watching TV.  Anticipate continuation of awaiting placement.   Cornell Bourbon, Canary Brim, MD 08/17/19 1102

## 2019-08-17 NOTE — Progress Notes (Addendum)
12:15p CSW provided patient with an update on SNF placement. Patient stated he is ready to go home as he feels he is hurting just sitting in the bed. CSW spoke with and encouraged patient to resume services with [facility]. Patient was agreeable and stated he understood the benefits of having HH. CSW assessed patient's income to see if ALF would be possible. Patient reports he believe he gets $1,800 from SS. Patient declined to wait for ALF placement as this can also take several more days if he is accepted anywhere. CSW encouraged to patient to work with [facility] social worker for placement. Patient gave verbal permission for CSW to contact his roommate to ensure that he will be at the home with patient arrives. Patient was able to contract for safety and denied SI/HI.   CSW contacted [facility] who reports they will resume with patient within 1-2 days. CSW also attempted to contact patient's roommate, but did not receive an answer. CSW awaiting call back from roommate.  9:45a CSW continues to seek SNF for patient. CSW has contacted the following SNFs.  Awaiting Decision [facility]  Declined [facility] (admissions on hold)  [facility]   Barriers: MH, [place] dx

## 2019-08-17 NOTE — ED Notes (Signed)
[facility] called for transport back home ?

## 2019-08-19 ENCOUNTER — Other Ambulatory Visit: Payer: Self-pay

## 2019-08-19 ENCOUNTER — Emergency Department (HOSPITAL_COMMUNITY)
Admission: EM | Admit: 2019-08-19 | Discharge: 2019-08-20 | Disposition: A | Discharge status: Home / Self Care | Payer: Medicare HMO | Attending: Emergency Medicine | Admitting: Emergency Medicine

## 2019-08-19 DIAGNOSIS — F1210 Cannabis abuse, uncomplicated: Secondary | ICD-10-CM | POA: Diagnosis not present

## 2019-08-19 DIAGNOSIS — F315 Bipolar disorder, current episode depressed, severe, with psychotic features: Secondary | ICD-10-CM | POA: Diagnosis not present

## 2019-08-19 DIAGNOSIS — Z046 Encounter for general psychiatric examination, requested by authority: Secondary | ICD-10-CM | POA: Diagnosis present

## 2019-08-19 DIAGNOSIS — F17210 Nicotine dependence, cigarettes, uncomplicated: Secondary | ICD-10-CM | POA: Insufficient documentation

## 2019-08-19 DIAGNOSIS — R45851 Suicidal ideations: Secondary | ICD-10-CM | POA: Insufficient documentation

## 2019-08-19 DIAGNOSIS — Z79899 Other long term (current) drug therapy: Secondary | ICD-10-CM | POA: Insufficient documentation

## 2019-08-19 DIAGNOSIS — G35 Multiple sclerosis: Secondary | ICD-10-CM | POA: Diagnosis not present

## 2019-08-19 LAB — ETHANOL: Alcohol, Ethyl (B): 40 mg/dL — ABNORMAL HIGH (ref <10)

## 2019-08-19 LAB — COMPREHENSIVE METABOLIC PANEL
ALT: 25 U/L (ref 0–44)
AST: 23 U/L (ref 15–41)
Albumin: 4.4 g/dL (ref 3.5–5.0)
Alkaline Phosphatase: 42 U/L (ref 38–126)
Anion gap: 13 (ref 5–15)
BUN: 25 mg/dL — ABNORMAL HIGH (ref 8–23)
CO2: 18 mmol/L — ABNORMAL LOW (ref 22–32)
Calcium: 8.8 mg/dL — ABNORMAL LOW (ref 8.9–10.3)
Chloride: 108 mmol/L (ref 98–111)
Creatinine, Ser: 1.11 mg/dL (ref 0.61–1.24)
GFR calc Af Amer: >60 mL/min (ref >60)
GFR calc non Af Amer: >60 mL/min (ref >60)
Glucose, Bld: 77 mg/dL (ref 70–99)
Potassium: 4.1 mmol/L (ref 3.5–5.1)
Sodium: 139 mmol/L (ref 135–145)
Total Bilirubin: 0.4 mg/dL (ref 0.3–1.2)
Total Protein: 7.4 g/dL (ref 6.5–8.1)

## 2019-08-19 LAB — SALICYLATE LEVEL: Salicylate Lvl: <7 mg/dL — ABNORMAL LOW (ref 7.0–30.0)

## 2019-08-19 LAB — CBC
HCT: 43.1 % (ref 39.0–52.0)
Hemoglobin: 14.2 g/dL (ref 13.0–17.0)
MCH: 32.9 pg (ref 26.0–34.0)
MCHC: 32.9 g/dL (ref 30.0–36.0)
MCV: 100 fL (ref 80.0–100.0)
Platelets: 196 10*3/uL (ref 150–400)
RBC: 4.31 MIL/uL (ref 4.22–5.81)
RDW: 13.5 % (ref 11.5–15.5)
WBC: 4.6 10*3/uL (ref 4.0–10.5)
nRBC: 0 % (ref 0.0–0.2)

## 2019-08-19 LAB — RAPID URINE DRUG SCREEN, HOSP PERFORMED
Amphetamines: NOT DETECTED
Barbiturates: NOT DETECTED
Benzodiazepines: NOT DETECTED
Cocaine: NOT DETECTED
Opiates: NOT DETECTED
Tetrahydrocannabinol: POSITIVE — AB

## 2019-08-19 LAB — ACETAMINOPHEN LEVEL: Acetaminophen (Tylenol), Serum: <10 ug/mL — ABNORMAL LOW (ref 10–30)

## 2019-08-19 MED ORDER — THIAMINE HCL 100 MG/ML IJ SOLN: 100.0000 mg | Freq: Every day | INTRAMUSCULAR | Status: DC

## 2019-08-19 MED ORDER — SERTRALINE HCL 50 MG PO TABS
50.0000 mg | ORAL_TABLET | Freq: Every day | ORAL | Status: DC
  Administered 2019-08-19 – 2019-08-20 (×2): 50 mg via ORAL
  Filled 2019-08-19 (×2): qty 1

## 2019-08-19 MED ORDER — THIAMINE HCL 100 MG PO TABS
100.0000 mg | ORAL_TABLET | Freq: Every day | ORAL | Status: DC
  Administered 2019-08-19 – 2019-08-20 (×2): 100 mg via ORAL
  Filled 2019-08-19 (×2): qty 1

## 2019-08-19 MED ORDER — LORAZEPAM 2 MG/ML IJ SOLN: 0.0000 mg | Freq: Four times a day (QID) | INTRAMUSCULAR | Status: DC

## 2019-08-19 MED ORDER — OLANZAPINE 10 MG PO TABS
10.0000 mg | ORAL_TABLET | Freq: Every day | ORAL | Status: DC
  Administered 2019-08-19: 10 mg via ORAL
  Filled 2019-08-19: qty 1

## 2019-08-19 MED ORDER — LORAZEPAM 1 MG PO TABS: 0.0000 mg | ORAL_TABLET | Freq: Two times a day (BID) | ORAL | Status: DC

## 2019-08-19 MED ORDER — LORAZEPAM 1 MG PO TABS: 0.0000 mg | ORAL_TABLET | Freq: Four times a day (QID) | ORAL | Status: DC

## 2019-08-19 MED ORDER — LORAZEPAM 2 MG/ML IJ SOLN: 0.0000 mg | Freq: Two times a day (BID) | INTRAMUSCULAR | Status: DC

## 2019-08-19 NOTE — ED Provider Notes (Signed)
[name]-EMERGENCY DEPT Provider Note   CSN: [id number] Arrival date & time: 08/19/19  1904     History Chief Complaint  Patient presents with  . Suicidal    [name] is a 64 y.o. male.  64 year old male who has history of depression as well as suicide attempt in the past with presents with suicidal ideations with plans to overdose.  Patient denies any attempt at this time.  Review of the old chart shows that he was just here 2 days ago for similar presentation and cleared by psychiatry.  States that he has felt more depressed.  Denies responding to internal stimuli.  He also admits to drinking alcohol daily and had 4 beers today prior to arrival.  Denies any homicidal ideations.        Past Medical History:  Diagnosis Date  . Depression   . Mental disorder   . Movement disorder   . MS (multiple sclerosis) (HCC)   . Multiple sclerosis, relapsing-remitting (HCC)   . Neuromuscular disorder (HCC)   . Vision abnormalities     Patient Active Problem List   Diagnosis Date Noted  . Polysubstance abuse (HCC) 08/12/2019  . Substance induced mood disorder (HCC) 08/12/2019  . Vitamin D deficiency 09/16/2018  . Altered mental status   . Acute encephalopathy   . Goals of care, counseling/discussion   . Palliative care encounter   . GI bleed 10/18/2017  . Symptomatic anemia   . Upper GI bleeding 10/16/2017  . Coccygeal pain, acute 04/08/2017  . Elevated TSH 04/06/2016  . Bipolar I disorder (HCC) 04/05/2016  . 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 Numbness 01/16/2016  . Alcohol abuse 07/16/2015  . Other fatigue 06/29/2014  . Gait disturbance 06/29/2014  . Urinary urgency 06/29/2014  . High risk medication use 06/29/2014  . MS (multiple sclerosis) (HCC) 10/31/2011    Class: Chronic  . Dysthymia 05/11/2011    Past Surgical History:  Procedure Laterality Date  . BIOPSY  10/17/2017   Procedure: BIOPSY;  Surgeon: Kathi Der, MD;  Location: [facility];  Service: Gastroenterology;;  . ESOPHAGOGASTRODUODENOSCOPY (EGD) WITH PROPOFOL N/A 10/17/2017   Procedure: ESOPHAGOGASTRODUODENOSCOPY (EGD) WITH PROPOFOL;  Surgeon: Kathi Der, MD;  Location: [facility];  Service: Gastroenterology;  Laterality: N/A;  . IR ANGIOGRAM SELECTIVE EACH ADDITIONAL VESSEL  10/21/2017  . IR ANGIOGRAM SELECTIVE EACH ADDITIONAL VESSEL  10/21/2017  . IR ANGIOGRAM VISCERAL SELECTIVE  10/21/2017  . IR EMBO ART  VEN HEMORR LYMPH EXTRAV  INC GUIDE ROADMAPPING  10/21/2017  . IR US GUIDE VASC ACCESS RIGHT  10/21/2017  . TONSILLECTOMY         Family History  Problem Relation Age of Onset  . Heart disease Mother   . Stroke Father     Social History   Tobacco Use  . Smoking status: Current Every Day Smoker    Packs/day: 1.00    Years: 36.00    Pack years: 36.00    Types: Cigarettes  . Smokeless tobacco: Never Used  Substance Use Topics  . Alcohol use: Yes    Alcohol/week: 0.0 standard drinks    Comment: 11 beers a day x "many years"  . Drug use: Yes    Types: Marijuana    Comment: 1-2 times a month    Home Medications Prior to Admission medications   Medication Sig Start Date End Date Taking? Authorizing Provider  folic acid (FOLVITE) 1 MG tablet Take 1 tablet (1 mg total) by mouth  daily. 09/16/18   Sater, [name], MD  OLANZapine (ZYPREXA) 10 MG tablet Take 1 tablet (10 mg total) by mouth at bedtime. 09/16/18   Sater, [name], MD  sertraline (ZOLOFT) 50 MG tablet Take 1 tablet (50 mg total) by mouth daily. 09/16/18   Sater, [name], MD  TECFIDERA 240 MG CPDR Take 1 capsule by mouth twice daily Patient taking differently: Take 240 mg by mouth in the morning and at bedtime.  01/07/19   Sater, [name], MD  Vitamin D, Ergocalciferol, (DRISDOL) 1.25 MG (50000 UT) CAPS capsule Take 1 capsule (50,000 Units total) by mouth every 7 (seven) days. Patient taking differently: Take 50,000 Units by mouth every Saturday.  09/18/18    Sater, [name], MD    Allergies    Patient has no known allergies.  Review of Systems   Review of Systems  All other systems reviewed and are negative.   Physical Exam Updated Vital Signs BP 108/75   Pulse 88   Temp 98 F (36.7 C)   Resp 16   Ht 1.829 m (6')   Wt 73.9 kg   SpO2 96%   BMI 22.11 kg/m   Physical Exam Vitals and nursing note reviewed.  Constitutional:      General: He is not in acute distress.    Appearance: Normal appearance. He is well-developed. He is not toxic-appearing.  HENT:     Head: Normocephalic and atraumatic.  Eyes:     General: Lids are normal.     Conjunctiva/sclera: Conjunctivae normal.     Pupils: Pupils are equal, round, and reactive to light.  Neck:     Thyroid: No thyroid mass.     Trachea: No tracheal deviation.  Cardiovascular:     Rate and Rhythm: Normal rate and regular rhythm.     Heart sounds: Normal heart sounds. No murmur. No gallop.   Pulmonary:     Effort: Pulmonary effort is normal. No respiratory distress.     Breath sounds: Normal breath sounds. No stridor. No decreased breath sounds, wheezing, rhonchi or rales.  Abdominal:     General: Bowel sounds are normal. There is no distension.     Palpations: Abdomen is soft.     Tenderness: There is no abdominal tenderness. There is no rebound.  Musculoskeletal:        General: No tenderness. Normal range of motion.     Cervical back: Normal range of motion and neck supple.  Skin:    General: Skin is warm and dry.     Findings: No abrasion or rash.  Neurological:     Mental Status: He is alert and oriented to person, place, and time.     GCS: GCS eye subscore is 4. GCS verbal subscore is 5. GCS motor subscore is 6.     Cranial Nerves: No cranial nerve deficit.     Sensory: No sensory deficit.  Psychiatric:        Attention and Perception: Attention normal.        Mood and Affect: Affect is flat.        Speech: Speech is delayed.        Behavior: Behavior is  withdrawn.     ED Results / Procedures / Treatments   Labs (all labs ordered are listed, but only abnormal results are displayed) Labs Reviewed  RESPIRATORY PANEL BY RT PCR (FLU A&B, COVID)  COMPREHENSIVE METABOLIC PANEL  ETHANOL  SALICYLATE LEVEL  ACETAMINOPHEN LEVEL  CBC  RAPID  URINE DRUG SCREEN, HOSP PERFORMED    EKG None  Radiology No results found.  Procedures Procedures (including critical care time)  Medications Ordered in ED Medications  LORazepam (ATIVAN) injection 0-4 mg (has no administration in time range)    Or  LORazepam (ATIVAN) tablet 0-4 mg (has no administration in time range)  LORazepam (ATIVAN) injection 0-4 mg (has no administration in time range)    Or  LORazepam (ATIVAN) tablet 0-4 mg (has no administration in time range)  thiamine tablet 100 mg (has no administration in time range)    Or  thiamine (B-1) injection 100 mg (has no administration in time range)  OLANZapine (ZYPREXA) tablet 10 mg (has no administration in time range)  sertraline (ZOLOFT) tablet 50 mg (has no administration in time range)    ED Course  I have reviewed the triage vital signs and the nursing notes.  Pertinent labs & imaging results that were available during my care of the patient were reviewed by me and considered in my medical decision making (see chart for details).    MDM Rules/Calculators/A&P                      Patient will be medically clear for psychiatric disposition Final Clinical Impression(s) / ED Diagnoses Final diagnoses:  None    Rx / DC Orders ED Discharge Orders    None       Lorre Nick, MD 08/19/19 1941

## 2019-08-19 NOTE — Progress Notes (Signed)
Calling cart, no answer
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
## 2019-08-19 NOTE — Progress Notes (Signed)
Received [name] from the ED via wheelchair, he changed into scrubs and attempted to give a urine specimen. He walks with a cane related to MS. He was OOB with and without assistance to the bathroom, received a snack and eventually drifted off to sleep after 0200 hrs.

## 2019-08-19 NOTE — ED Triage Notes (Signed)
Pt brought in by police after roommate called . Pt sts he is SI. Pt plan is to take sleeping pills. Pt states he did not take anything tonight.

## 2019-08-19 NOTE — Progress Notes (Signed)
Alexander Rakers, NP recommends continued observation for safety and stabilization and to be reassessed in the AM by psych. Pt's nurse Rex Kras, RN has been advised.

## 2019-08-20 NOTE — BH Assessment (Addendum)
Patient reassessed by psych Zella Ball, NP this morning. Per Netty Starring, NP, patient is psych cleared for discharge today. No outpatient providers noted on patients chart. Clinician provided patient with a listed of psychiatrist and therapist.   Patient recommended to follow with with home health to assist with ADL's as a long term goal. Patient's short term recommendations are to follow up with SNF. LCSW consult ordered. Clinician also discussed disposition with LCSW.

## 2019-08-20 NOTE — Progress Notes (Signed)
CSW received call from TTS stating that patient is to be psych cleared. The disposition recommendation was for patient to return home with [facility]. CSW is very familiar with this patient. CSW spoke with patient at bedside. CSW informed that he has been psych cleared and will be discharging back home with [facility]. Patient reports he was agreeable to this. CSW spoke with patient about ways to decrease his SI as well as encouraged him to decrease his drinking and try to get back with OP MH and SA. Patient was agreeable to CSW contacting his roommate to possibly pick him up. If this isn't possible CSW will see if patient can get a Lyft. Deferring using [facility] at this time due to the limited resource and patient may occur an additional bill.   CSW received call from patient's roommate who reports he can come get patient when he is up for discharge. CSW made RN aware. Awaiting psych discharge.   Geralyn Corwin, LCSW Transitions of Care Department [facility] ED [phone]

## 2019-08-20 NOTE — Progress Notes (Signed)
[facility] Discharge Suicide Risk Assessment   Principal Problem: <principal problem not specified> Discharge Diagnoses: Active Problems: No active hospital problems.  Subjective: Patient assessed by medical students, NP, and Dr. Dwyane Dee via telepsych. Patient arrived to ED voluntarily on 4/28 due to suicidal ideation with a plan to OD on sleeping pills.  Patient was sitting up in bed and oriented during the visit. Patient explained his previous plan to overdose on sleeping pills he would obtain from [facility]. Patient denies this plan currently. Patient denies any homicidal ideation. It was not assessed directly, but patient did not appear to be responding to internal stimuli or experiencing any auditory or visual hallucinations during the visit.   Patient reported that he lives at home with a roommate and feels safe at home. He denied any access to firearms. Patient endorses being able to make his own food and clean himself, but per previous notes patient does have difficulties with ADLs and desires help.  Per chart review patient does have a history of alcohol use, stating he drinks 11 beers daily. Patient also reports monthly use of marijuana.   Total Time spent with patient: 15 minutes   Past Psychiatric History: Alcohol use disorder, bipolar 1 disorder, substance-induced mood disorder  Musculoskeletal: Strength & Muscle Tone: within normal limits Gait & Station: normal Patient leans: N/A  Psychiatric Specialty Exam: Review of Systems  Blood pressure 140/66, pulse 75, temperature 98 F (36.7 C), temperature source Oral, resp. rate 17, height 6' (1.829 m), weight 73.9 kg, SpO2 95 %.Body mass index is 22.11 kg/m.  General Appearance: Disheveled  Eye Sport and exercise psychologist::  Fair  Speech:  Clear and Coherent and Slow  Volume:  Normal  Mood:  Euthymic  Affect:  Congruent  Thought Process:  Goal Directed and Linear  Orientation:  Full (Time, Place, and Person)  Thought Content:  WDL  Suicidal Thoughts:   No  Homicidal Thoughts:  No  Memory:  Immediate;   Fair Recent;   Fair Remote;   Fair  Judgement:  Fair  Insight:  Fair  Psychomotor Activity:  Normal  Concentration:  Fair  Recall:  Not directly assessed, but seemed fair.   [place] directly assessed but seemed fair  Language: Fair  Akathisia:  No  Handed:  Right  AIMS (if indicated):     Assets:  Desire for Improvement Leisure Time  Sleep:   Fair  Cognition: WNL  ADL's:  Impaired   Mental Status Per Nursing Assessment::   On Admission:     Demographic Factors:  Divorced or widowed, Caucasian, Low socioeconomic status and Unemployed  Loss Factors: NA  Historical Factors: NA  Risk Reduction Factors:   Living with another person, especially a relative  Continued Clinical Symptoms:  Alcohol/Substance Abuse/Dependencies  Cognitive Features That Contribute To Risk:  None    Suicide Risk:  Moderate:  Frequent suicidal ideation with limited intensity, and duration, some specificity in terms of plans, no associated intent, good self-control, limited dysphoria/symptomatology, some risk factors present, and identifiable protective factors, including available and accessible social support.    Plan Of Care/Follow-up recommendations:   Patient is familiar to system. He was recently discharged on 4/26 after being seen for suicidal ideation with plan to OD on sleeping medications. On presentation on 4/28 patient endorsed the same plan as previously outlined. Today he is not having suicidal ideation and is OK with discharge home to his living arrangement with a roommate. Patient does have difficulty with ADLs and will be set up  with home health services for assistance. Referral in progress to home health.  Patient expressed interest in an assisted living facility of SNF. Patient made aware that assisted living facilities and SNFs require patient not to be suicidal for two weeks prior to accepting. Patient in agreement  with plan to work toward a SNF or assisted living facility placement and in the meantime utilize home health services. Patient to be discharged today.   Arvil Chaco, Medical Student 08/20/2019, 10:49 AM

## 2019-09-17 ENCOUNTER — Encounter: Payer: Self-pay | Admitting: Neurology

## 2019-09-17 ENCOUNTER — Other Ambulatory Visit: Payer: Self-pay

## 2019-09-17 ENCOUNTER — Ambulatory Visit (INDEPENDENT_AMBULATORY_CARE_PROVIDER_SITE_OTHER): Payer: Medicare HMO | Admitting: Neurology

## 2019-09-17 ENCOUNTER — Other Ambulatory Visit: Payer: Self-pay | Admitting: Neurology

## 2019-09-17 DIAGNOSIS — R5383 Other fatigue: Secondary | ICD-10-CM | POA: Diagnosis not present

## 2019-09-17 DIAGNOSIS — R269 Unspecified abnormalities of gait and mobility: Secondary | ICD-10-CM

## 2019-09-17 DIAGNOSIS — G35 Multiple sclerosis: Secondary | ICD-10-CM

## 2019-09-17 DIAGNOSIS — E559 Vitamin D deficiency, unspecified: Secondary | ICD-10-CM

## 2019-09-17 NOTE — Progress Notes (Signed)
[facility]  PATIENT: [name] DOB: 01-14-1956  REFERRING DOCTOR OR PCP: Dr.  Shana Chute SOURCE: patient and records from [facility]  _________________________________   HISTORICAL  CHIEF COMPLAINT:  Chief Complaint  Patient presents with  . Follow-up    RM 13 with a friend, Viviann Spare. Last seen 09/16/2018. Ambulates with a cane. Most recent fall last Thursday. Fell and hit left eye.  . Multiple Sclerosis    On brand name Tecfidera.     HISTORY OF PRESENT ILLNESS:  [name] is a 64 y.o. man with relapsing remitting multiple sclerosis and bipolar disease.   Update 09/16/2019: He is on Tecfidera and tolerates it well.  He denies any flushing or stomach upset.  He has not had any definite new symptoms.  Neurologically stable.  He continues to have some difficulty with his gait.  There have been some stumbles and one fall.  Legs are symmetric in strength.    He has mild dysesthesias in legs.   He has some urinary urgency.  He has had 2 episodes of fecal incontinence, one today.  Vision is doing well.  He feels his mood has done better.  He feels less depressed.  Denies anxiety.  He has a long psych history with several suicide attempts in the past.  I encouraged him to follow-up again with psychiatry.  From 10/04/2016: He recently had a Psych admission at [facility] for depression ans suicidal ideation.    Abilify was added (has only been on a few days).  MS:   He has been on Tecfidera since late 2013 with a gap earlier this year when he ran out.   Marland KitchenHe tolerates it well and has not had any definite exacerbation while on it. A recent lymphocyte count was good at 1.1 (09/23/16).    The MRI of the brain 07/2015 was stale.   It shows T2/FLAIR hyperintense foci in a pattern and configuration consistent with multiple sclerosis.   The MRI of the cervical spine shows some degenerative changes but no  MS plaques  Tremor:  He notes a rapid fine tremor in his hands  that is not too bothersome.  Of note, lithium has recently been added but he did not have tremor the last time.    Tinnitus:   He notes a ringing tinnitus bilateral that has been present a while but seems worse.   Stutter:   He feels his stuttering speech is worse and "I can't half talk" .    notes a stuttering speech since being started on Lithium.    He finds this frustrating and also notes tha the has trouble coming up with the right words.     Gait/strength/sensation: He feels gait is worse.  He stumbles more.   He relies on his cane.   Balance is poor. No recent fall.   He notes mild weakness in both legs associated with some spasticity. He denies weakness or numbness in the arms..   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   Bladder:  He notes mild hesitancy but this is stable.   He also has some urgency, frequency.   No incontinence or UTI.  e has occasional constipation but had diarrhea 2 weeks ago. He has not had any urinary or fecal incontinence in the past few years.    Vision:  He denies visual blurring.[name]  He notes diplopia when he looks either left or right.   He denies any eye pain.  Fatigue/sleep: He reports mild fatigue, about the same as usual for him.   He does not have to take naps. Amantadine helped in the past but stopped helping so he stopped.[name] He sleeps well most nights getting 7 hours or so.     Mood/cognition:    He has bipolar disease and has had multiple inpatient psychiatric admissions with various changes of medications over the years. He is on lithium and Abilify.    He has some depression but feels he is fairly stable.    MS History:   He presented with double vision in 2000.  MRI at that time showed lesions in the brain consistent with multiple sclerosis. He also had a lumbar puncture and the CSF was  consistent with MS.   He was started on Avonex.  He had another episode of diplopia followed by weakness in both legs 3 years later.    He was switched to Copaxone for a while. Unfortunate, he had an another severe exacerbation with his legs this. He was started on Tysabri once a month.   For several years, he stayed to [place] but stopped as it was difficult for him to get the infusions. In 2011, he switched to [place]. 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 He had one exacerbation with weakness in 2012. He then started Tecfidera in late 2013. He has generally tolerated it well. He did not have any significant flushing or GI side effects. He has not had any definite exacerbations while on Tecfidera.      REVIEW OF SYSTEMS: Constitutional: No fevers, chills, sweats, or change in appetite.   He reports fatigue  eyes: Notes double vision.  No eye pain Ear, nose and throat: No hearing loss, ear pain, nasal congestion, sore throat Cardiovascular: No chest pain, palpitations Respiratory: No shortness of breath at rest or with exertion.   No wheezes.  Occ coughs (smokes) GastrointestinaI: No nausea, vomiting, diarrhea, abdominal pain, fecal incontinence Genitourinary: No dysuria, urinary retention.  Mild frequency.  No nocturia. Musculoskeletal: No neck pain, mild back pain Integumentary: No rash, pruritus, skin lesions Neurological: as above Psychiatric: He notes more depression.   Been diagnosed with bipolar disease.  He currently does not have a psychiatrist. Endocrine: No palpitations, diaphoresis, change in appetite, change in weigh or increased thirst Hematologic/Lymphatic: No anemia, purpura, petechiae. Allergic/Immunologic: No itchy/runny eyes, nasal congestion, recent allergic reactions, rashes  ALLERGIES: No Known Allergies  HOME MEDICATIONS:  Current Outpatient Medications:  .  folic acid (FOLVITE) 1 MG tablet, Take 1 tablet (1 mg  total) by mouth daily., Disp: 90 tablet, Rfl: 3 .  OLANZapine (ZYPREXA) 10 MG tablet, Take 1 tablet (10 mg total) by mouth at bedtime., Disp: 90 tablet, Rfl: 3 .  sertraline (ZOLOFT) 50 MG tablet, Take 1 tablet (50 mg total) by mouth daily., Disp: 90 tablet, Rfl: 3 .  TECFIDERA 240 MG CPDR, Take 1 capsule by mouth twice daily (Patient taking differently: Take 240 mg by mouth in the morning and at bedtime. ), Disp: 60 capsule, Rfl: 11 .  Vitamin D, Ergocalciferol, (DRISDOL) 1.25 MG (50000 UT) CAPS capsule, Take 1 capsule (50,000 Units total) by mouth every 7 (seven) days. (Patient taking differently: Take 50,000 Units by mouth every Saturday. ), Disp: 13 capsule, Rfl: 4  PAST MEDICAL HISTORY: Past Medical History:  Diagnosis Date  . Depression   . Mental disorder   . Movement disorder   . MS (multiple sclerosis) ([place])   . Multiple sclerosis, relapsing-remitting ([place])   . Neuromuscular disorder ([place])   . Vision abnormalities     PAST SURGICAL HISTORY: Past Surgical History:  Procedure Laterality Date  . BIOPSY  10/17/2017   Procedure: BIOPSY;  Surgeon: Otis Brace, MD;  Location: [facility];  Service: Gastroenterology;;  . ESOPHAGOGASTRODUODENOSCOPY (EGD) WITH PROPOFOL N/A 10/17/2017   Procedure: ESOPHAGOGASTRODUODENOSCOPY (EGD) WITH PROPOFOL;  Surgeon: Otis Brace, MD;  Location: [facility];  Service: Gastroenterology;  Laterality: N/A;  . IR ANGIOGRAM SELECTIVE EACH ADDITIONAL VESSEL  10/21/2017  . IR ANGIOGRAM SELECTIVE EACH ADDITIONAL VESSEL  10/21/2017  . IR ANGIOGRAM VISCERAL SELECTIVE  10/21/2017  . IR EMBO ART  VEN HEMORR LYMPH EXTRAV  INC GUIDE ROADMAPPING  10/21/2017  . IR US GUIDE VASC ACCESS RIGHT  10/21/2017  . TONSILLECTOMY      FAMILY HISTORY: Family History  Problem Relation Age of Onset  . Heart disease Mother   . Stroke Father     SOCIAL HISTORY:  Social History   Socioeconomic History  . Marital status: Legally Separated    Spouse name: Not on file    . Number of children: Not on file  . Years of education: Not on file  . Highest education level: Not on file  Occupational History  . Not on file  Tobacco Use  . Smoking status: Current Every Day Smoker    Packs/day: 1.00    Years: 36.00    Pack years: 36.00    Types: Cigarettes  . Smokeless tobacco: Never Used  Substance and Sexual Activity  . Alcohol use: Yes    Alcohol/week: 0.0 standard drinks    Comment: 11 beers a day x "many years"  . Drug use: Yes    Types: Marijuana    Comment: 1-2 times a month  . Sexual activity: Not Currently    Birth control/protection: None  Other Topics Concern  . Not on file  Social History Narrative   ** Merged History Encounter **       Social Determinants of Health   Financial Resource Strain:   . Difficulty of Paying Living Expenses:   Food Insecurity:   . Worried About Charity fundraiser in the Last Year:   . Arboriculturist in the Last Year:   Transportation Needs:   . Film/video editor (Medical):   [name] Lack of Transportation (Non-Medical):   Physical Activity:   . Days of Exercise per Week:   . Minutes of Exercise per Session:   Stress:   . Feeling of Stress :   Social Connections:   . Frequency of Communication with Friends and Family:   . Frequency of Social Gatherings with Friends and Family:   . Attends Religious Services:   . Active Member of Clubs or Organizations:   . Attends Archivist Meetings:   [name] Marital Status:   Intimate Partner Violence:   . Fear of Current or Ex-Partner:   . Emotionally Abused:   [name] Physically Abused:   . Sexually Abused:      PHYSICAL EXAM  Vitals:   09/17/19 1145  BP: 100/65  Pulse: 80  SpO2: 97%  Weight: 168 lb (76.2 kg)  Height: 6' (1.829 m)    Body mass index is 22.78 kg/m.   General: The patient is well-developed and well-nourished and in no acute distress  Musculoskeletal: Mild cervical and lumbar paraspinal tenderness..  Neurologic Exam  Mental  status: He has a flat affect.  The patient is alert and oriented x 3 at the time of the examination.  The patient has apparent normal recent and remote memory, with an apparently normal attention span and concentration ability. The speech is pretty normal today per   Cranial nerves: Extraocular muscles appear to be intact. Facial strength is normal.  He reports reduced left facial sensation to touch..     Trapezius and sternocleidomastoid strength is normal. No dysarthria is noted.    No obvious hearing deficits are noted.  Motor: Muscle bulk is normal.  Muscle tone is increased in the legs.  Strength is  5 / 5 in all 4 extremities except 4+/5 in hip flexors   Sensory: Sensory testing is intact to soft touch and vibration sensation in all 4 extremities.    Coordination: He has reasonably adequate finger-nose-finger and reduced heel-to-shin.  Gait and station: Station is wide.  The gait is shuffling.  He does better with a cane..  Romberg is negative.   Reflexes: Deep tendon reflexes are symmetric and 3+ bilaterally.   There are crossed abductor responses at the knees.    DIAGNOSTIC DATA (LABS, IMAGING, TESTING) - I reviewed patient records, labs, notes, testing and imaging myself where available.  Lab Results  Component Value Date   WBC 4.6 08/19/2019   HGB 14.2 08/19/2019   HCT 43.1 08/19/2019   MCV 100.0 08/19/2019   PLT 196 08/19/2019      Component Value Date/Time   NA 139 08/19/2019 1926   NA 136 09/16/2018 0905   K 4.1 08/19/2019 1926   CL 108 08/19/2019 1926   CO2 18 (L) 08/19/2019 1926   GLUCOSE 77 08/19/2019 1926   BUN 25 (H) 08/19/2019 1926   BUN 10 09/16/2018 0905   CREATININE 1.11 08/19/2019 1926   CALCIUM 8.8 (L) 08/19/2019 1926   PROT 7.4 08/19/2019 1926   PROT 6.8 09/16/2018 0905   ALBUMIN 4.4 08/19/2019 1926   ALBUMIN 4.5 09/16/2018 0905   AST 23 08/19/2019 1926   ALT 25 08/19/2019 1926   ALKPHOS 42 08/19/2019 1926   BILITOT 0.4 08/19/2019 1926   BILITOT  0.4 09/16/2018 0905   GFRNONAA >60 08/19/2019 1926   GFRAA >60 08/19/2019 1926    Lab Results  Component Value Date   VITAMINB12 180 10/17/2017   Lab Results  Component Value Date   TSH 7.010 (H) 04/05/2016     ASSESSMENT AND PLAN  Multiple sclerosis (HCC) - Plan: CBC with Differential/Platelets  MS (multiple sclerosis) (HCC) - Plan: CBC with Differential/Platelets  Gait disturbance - Plan: CBC with Differential/Platelets  Other fatigue - Plan: CBC with Differential/Platelets  Vitamin D deficiency - Plan: CBC with Differential/Platelets   1.  Continue Tecfidera.  We will check CBC with differential 2.  Stay active and exercise as tolerated.   3.  He had an episode of bowel incontinence.  If this worsens, consider changes in diet, bulk fiber or referral to GI.   4.   Currently he feels mood is doing well.  I still encouraged him that he should follow-up with psychiatry as this has been an issue for him in the past.   5.   He will return to see me in 6 months or sooner if he has new or worsening neurologic symptoms.   Shonice Wrisley A. Epimenio Foot, MD, PhD 09/17/2019, 4:21 PM Certified in Neurology, Clinical Neurophysiology, Sleep Medicine,  Pain Medicine and Neuroimaging  [facility] [address] [place], [place] [address] [phone]

## 2019-09-18 LAB — CBC WITH DIFFERENTIAL/PLATELET
Basophils Absolute: 0 10*3/uL (ref 0.0–0.2)
Basos: 1 %
EOS (ABSOLUTE): 0 10*3/uL (ref 0.0–0.4)
Eos: 0 %
Hematocrit: 46.1 % (ref 37.5–51.0)
Hemoglobin: 15.3 g/dL (ref 13.0–17.7)
Immature Grans (Abs): 0 10*3/uL (ref 0.0–0.1)
Immature Granulocytes: 1 %
Lymphocytes Absolute: 0.7 10*3/uL (ref 0.7–3.1)
Lymphs: 13 %
MCH: 31.4 pg (ref 26.6–33.0)
MCHC: 33.2 g/dL (ref 31.5–35.7)
MCV: 95 fL (ref 79–97)
Monocytes Absolute: 0.5 10*3/uL (ref 0.1–0.9)
Monocytes: 10 %
Neutrophils Absolute: 3.9 10*3/uL (ref 1.4–7.0)
Neutrophils: 75 %
Platelets: 203 10*3/uL (ref 150–450)
RBC: 4.88 x10E6/uL (ref 4.14–5.80)
RDW: 13.3 % (ref 11.6–15.4)
WBC: 5.2 10*3/uL (ref 3.4–10.8)

## 2019-10-05 ENCOUNTER — Telehealth: Payer: Self-pay

## 2019-10-05 NOTE — Telephone Encounter (Signed)
Dr. Epimenio Foot filled out pt's disability form for [employer].Sent to MR for processing.

## 2019-10-07 ENCOUNTER — Telehealth: Payer: Self-pay | Admitting: *Deleted

## 2019-10-07 NOTE — Telephone Encounter (Signed)
I faxed pt [employer] form to [employer] [phone]

## 2019-12-31 ENCOUNTER — Other Ambulatory Visit: Payer: Self-pay | Admitting: *Deleted

## 2019-12-31 DIAGNOSIS — G35 Multiple sclerosis: Secondary | ICD-10-CM

## 2019-12-31 MED ORDER — TECFIDERA 240 MG PO CPDR: DELAYED_RELEASE_CAPSULE | ORAL | 11 refills | Status: DC

## 2020-03-30 NOTE — Telephone Encounter (Signed)
Open telephone note in error.  Michel Bickers RN, BSN  ED Care Manager [phone]

## 2020-04-05 ENCOUNTER — Telehealth: Payer: Self-pay | Admitting: Neurology

## 2020-04-05 NOTE — Telephone Encounter (Addendum)
Reviewed pt chart. He last saw Dr. Epimenio Foot 09/17/19. Wanted to see him back around 03/19/20. Has no pending follow up appt.  Tried calling pt to set up appt at [phone]. Went to VM and VM not set up. Could not leave message.

## 2020-04-05 NOTE — Telephone Encounter (Signed)
Terry nurse at [facility] called stating that the pt informed her that he has been missing every other dose of his   TECFIDERA 240 MG CPDR. Aurther Loft urged pt to discuss this with provider and pt stated that he had an appt to f/u tomorrow. Schedule was checked and pt is Not on the scheduled like he previously stated. Aurther Loft or any other nurse will be available to discuss this further with RN or Provider if needed. Please advise. [phone] ext. [phone]

## 2020-04-11 ENCOUNTER — Encounter: Payer: Self-pay | Admitting: *Deleted

## 2020-04-11 NOTE — Telephone Encounter (Signed)
Sent pt letter

## 2020-05-05 ENCOUNTER — Encounter: Payer: Self-pay | Admitting: Family Medicine

## 2020-05-05 ENCOUNTER — Ambulatory Visit (INDEPENDENT_AMBULATORY_CARE_PROVIDER_SITE_OTHER): Payer: Medicare Other | Admitting: Family Medicine

## 2020-05-05 VITALS — BP 105/73 | HR 91 | Ht 71.0 in | Wt 166.2 lb

## 2020-05-05 DIAGNOSIS — Z79899 Other long term (current) drug therapy: Secondary | ICD-10-CM

## 2020-05-05 DIAGNOSIS — E559 Vitamin D deficiency, unspecified: Secondary | ICD-10-CM

## 2020-05-05 DIAGNOSIS — G35 Multiple sclerosis: Secondary | ICD-10-CM | POA: Diagnosis not present

## 2020-05-05 DIAGNOSIS — F319 Bipolar disorder, unspecified: Secondary | ICD-10-CM

## 2020-05-05 DIAGNOSIS — R269 Unspecified abnormalities of gait and mobility: Secondary | ICD-10-CM

## 2020-05-05 NOTE — Progress Notes (Signed)
I have read the note, and I agree with the clinical assessment and plan.  Tyrease Vandeberg A. Calyn Rubi, MD, PhD, FAAN Certified in Neurology, Clinical Neurophysiology, Sleep Medicine, Pain Medicine and Neuroimaging  [facility] [address], [address] [place], [place] [address] [phone]  

## 2020-05-05 NOTE — Patient Instructions (Signed)
Below is our plan:  We will continue [employer]. Call [facility] to make sure they have sent medications.   Please make sure you are staying well hydrated. I recommend 50-60 ounces daily. Well balanced diet and regular exercise encouraged.    Please continue follow up with care team as directed.   Follow up with Dr Epimenio Foot in 6 months   You may receive a survey regarding today's visit. I encourage you to leave honest feed back as I do use this information to improve patient care. Thank you for seeing me today!      Fall Prevention in the Home, Adult Falls can cause injuries and can happen to people of all ages. There are many things you can do to make your home safe and to help prevent falls. Ask for help when making these changes. What actions can I take to prevent falls? General Instructions  Use good lighting in all rooms. Replace any light bulbs that burn out.  Turn on the lights in dark areas. Use night-lights.  Keep items that you use often in easy-to-reach places. Lower the shelves around your home if needed.  Set up your furniture so you have a clear path. Avoid moving your furniture around.  Do not have throw rugs or other things on the floor that can make you trip.  Avoid walking on wet floors.  If any of your floors are uneven, fix them.  Add color or contrast paint or tape to clearly mark and help you see: ? Grab bars or handrails. ? First and last steps of staircases. ? Where the edge of each step is.  If you use a stepladder: ? Make sure that it is fully opened. Do not climb a closed stepladder. ? Make sure the sides of the stepladder are locked in place. ? Ask someone to hold the stepladder while you use it.  Know where your pets are when moving through your home. What can I do in the bathroom?  Keep the floor dry. Clean up any water on the floor right away.  Remove soap buildup in the tub or shower.  Use nonskid mats or decals on the floor of the tub or  shower.  Attach bath mats securely with double-sided, nonslip rug tape.  If you need to sit down in the shower, use a plastic, nonslip stool.  Install grab bars by the toilet and in the tub and shower. Do not use towel bars as grab bars.      What can I do in the bedroom?  Make sure that you have a light by your bed that is easy to reach.  Do not use any sheets or blankets for your bed that hang to the floor.  Have a firm chair with side arms that you can use for support when you get dressed. What can I do in the kitchen?  Clean up any spills right away.  If you need to reach something above you, use a step stool with a grab bar.  Keep electrical cords out of the way.  Do not use floor polish or wax that makes floors slippery. What can I do with my stairs?  Do not leave any items on the stairs.  Make sure that you have a light switch at the top and the bottom of the stairs.  Make sure that there are handrails on both sides of the stairs. Fix handrails that are broken or loose.  Install nonslip stair treads on all your stairs.  Avoid having throw rugs at the top or bottom of the stairs.  Choose a carpet that does not hide the edge of the steps on the stairs.  Check carpeting to make sure that it is firmly attached to the stairs. Fix carpet that is loose or worn. What can I do on the outside of my home?  Use bright outdoor lighting.  Fix the edges of walkways and driveways and fix any cracks.  Remove anything that might make you trip as you walk through a door, such as a raised step or threshold.  Trim any bushes or trees on paths to your home.  Check to see if handrails are loose or broken and that both sides of all steps have handrails.  Install guardrails along the edges of any raised decks and porches.  Clear paths of anything that can make you trip, such as tools or rocks.  Have leaves, snow, or ice cleared regularly.  Use sand or salt on paths during  winter.  Clean up any spills in your garage right away. This includes grease or oil spills. What other actions can I take?  Wear shoes that: ? Have a low heel. Do not wear high heels. ? Have rubber bottoms. ? Feel good on your feet and fit well. ? Are closed at the toe. Do not wear open-toe sandals.  Use tools that help you move around if needed. These include: ? Canes. ? Walkers. ? Scooters. ? Crutches.  Review your medicines with your doctor. Some medicines can make you feel dizzy. This can increase your chance of falling. Ask your doctor what else you can do to help prevent falls. Where to find more information  Centers for Disease Control and Prevention, [employer]: FootballExhibition.com.br  [employer] on Aging: https://walker.com/ Contact a doctor if:  You are afraid of falling at home.  You feel weak, drowsy, or dizzy at home.  You fall at home. Summary  There are many simple things that you can do to make your home safe and to help prevent falls.  Ways to make your home safe include removing things that can make you trip and installing grab bars in the bathroom.  Ask for help when making these changes in your home. This information is not intended to replace advice given to you by your health care provider. Make sure you discuss any questions you have with your health care provider. Document Revised: 11/11/2019 Document Reviewed: 11/11/2019 Elsevier Patient Education  2021 Elsevier Inc.   Multiple Sclerosis Multiple sclerosis (MS) is a disease of the brain, spinal cord, and optic nerves (central nervous system). It causes the body's disease-fighting (immune) system to destroy the protective covering (myelin sheath) around nerves in the brain. When this happens, signals (nerve impulses) going to and from the brain and spinal cord do not get sent properly or may not get sent at all. There are several types of MS:  Relapsing-remitting MS. This is the most common type. This  causes sudden attacks of symptoms. After an attack, you may recover completely until the next attack, or some symptoms may remain permanently.  Secondary progressive MS. This usually develops after the onset of relapsing-remitting MS. Similar to relapsing-remitting MS, this type also causes sudden attacks of symptoms. Attacks may be less frequent, but symptoms slowly get worse (progress) over time.  Primary progressive MS. This causes symptoms that steadily progress over time. This type of MS does not cause sudden attacks of symptoms. The age of onset of MS  varies, but it often develops between 2120-65 years of age. MS is a lifelong (chronic) condition. There is no cure, but treatment can help slow down the progression of the disease. What are the causes? The cause of this condition is not known. What increases the risk? You are more likely to develop this condition if:  You are a woman.  You have a relative with MS. However, the condition is not passed from parent to child (inherited).  You have a lack (deficiency) of vitamin D.  You smoke. MS is more common in the [place] than in the [place]. What are the signs or symptoms? Relapsing-remitting and secondary progressive MS cause symptoms to occur in episodes or attacks that may last weeks to months. There may be long periods between attacks in which there are almost no symptoms. Primary progressive MS causes symptoms to steadily progress after they develop. Symptoms of MS vary because of the many different ways it affects the central nervous system. The main symptoms include:  Vision problems and eye pain.  Numbness and weakness.  Inability to move your arms, hands, feet, or legs (paralysis).  Balance problems.  Shaking that you cannot control (tremors).  Muscle spasms.  Problems with thinking (cognitive changes). MS can also cause symptoms that are associated with the disease, but are not always the  direct result of an MS attack. They may include:  Inability to control urination or bowel movements (incontinence).  Headaches.  Fatigue.  Inability to tolerate heat.  Emotional changes.  Depression.  Pain. How is this diagnosed? This condition is diagnosed based on:  Your symptoms.  A neurological exam. This involves checking central nervous system function, such as nerve function, reflexes, and coordination.  MRIs of the brain and spinal cord.  Lab tests, including a lumbar puncture that tests the fluid that surrounds the brain and spinal cord (cerebrospinal fluid).  Tests to measure the electrical activity of the brain in response to stimulation (evoked potentials). How is this treated? There is no cure for MS, but medicines can help decrease the number and frequency of attacks and help relieve nuisance symptoms. Treatment options may include:  Medicines that reduce the frequency of attacks. These medicines may be given by injection, by mouth (orally), or through an IV.  Medicines that reduce inflammation (steroids). These may provide short-term relief of symptoms.  Medicines to help control pain, depression, fatigue, or incontinence.  Nutritional counseling. Vitamin D supplements, if you have a deficiency.  Using devices to help you move around (assistive devices), such as braces, a cane, or a walker.  Physical therapy to strengthen and stretch your muscles.  Occupational therapy to help you with everyday tasks.  Alternative or complementary treatments such as exercise, massage, or acupuncture.   Follow these instructions at home:  Take over-the-counter and prescription medicines only as told by your health care provider.  Do not drive or use heavy machinery while taking prescription pain medicine.  Use assistive devices as recommended by your physical therapist or your health care provider.  Exercise as directed by your health care provider.  Eating healthy  can help manage MS symptoms.  Return to your normal activities as told by your health care provider. Ask your health care provider what activities are safe for you.  Reach out for support. Share your feelings with friends, family, or a support group.  Keep all follow-up visits as told by your health care provider and therapists. This is important. Where to  find more information  National Multiple Sclerosis Society: https://www.nationalmssociety.org  [employer] of Neurological Disorders and Stroke: https://johnson-smith.net/  [employer] for Complementary and Integrative Health: http://miller-hamilton.net/ Contact a health care provider if:  You feel depressed.  You develop new pain or numbness.  You have tremors.  You have problems with sexual function. Get help right away if:  You develop paralysis.  You develop numbness.  You have problems with your bladder or bowel function.  You develop double vision.  You lose vision in one or both eyes.  You develop suicidal thoughts.  You develop severe confusion. If you ever feel like you may hurt yourself or others, or have thoughts about taking your own life, get help right away. You can go to your nearest emergency department or call:  Your local emergency services (911 in the [place]  A suicide crisis helpline, such as the National Suicide Prevention Lifeline at 361-677-5341. This is open 24 hours a day. Summary  Multiple sclerosis (MS) is a disease of the central nervous system that causes the body's immune system to destroy the protective covering (myelin sheath) around nerves in the brain.  There are 3 types of MS: relapsing-remitting, secondary progressive, and primary progressive. Relapsing-remitting and secondary progressive MS cause symptoms to occur in episodes or attacks that may last weeks to months. Primary progressive MS causes symptoms to steadily progress after they develop.  There is no cure for  MS, but medicines can help decrease the number and frequency of attacks and help relieve nuisance symptoms. Treatment may also include physical or occupational therapy.  If you develop numbness, paralysis, vision problems, or other neurological symptoms, get help right away. This information is not intended to replace advice given to you by your health care provider. Make sure you discuss any questions you have with your health care provider. Document Revised: 01/19/2020 Document Reviewed: 01/19/2020 Elsevier Patient Education  2021 [employer].

## 2020-05-05 NOTE — Progress Notes (Signed)
Chief Complaint  Patient presents with  . Follow-up    Rm 2 roommate (steven) Pt is well      HISTORY OF PRESENT ILLNESS: Today 05/05/20  [name] is a 65 y.o. male here today for follow up for RRMS. He continues Tecfidera. Last MRI 2019. Labs drawn with PCP last week and reportedly normal.   He feels he is doing about the same. No new or worsening symptoms. He continues to have trouble walking. His legs feel weak, especially when standing up. He has not fallen recently. He is using a Museum/gallery exhibitions officer.   Mood is stable. He is not on any mood stabilization medications at this time. He does not feel he needs to see psychiatry. He does not feel it is helpful. He denies SI/HI. He lives with his roommate, [name]. [name] reports that he seems good, lately. He has not mentioned any thoughts of harming himself. He has limited support.   He has vitamin D def. He is followed by Dr Natalia Leatherwood with [facility]. She recently checked levels and he reports that he was given a rx for vitamin D.    HISTORY (copied from Dr Bonnita Hollow previous note)  [name] is a 65 y.o. man with relapsing remitting multiple sclerosis and bipolar disease.   Update 09/16/2019: He is on Tecfidera and tolerates it well.  He denies any flushing or stomach upset.  He has not had any definite new symptoms.  Neurologically stable.  He continues to have some difficulty with his gait.  There have been some stumbles and one fall.  Legs are symmetric in strength.    He has mild dysesthesias in legs.   He has some urinary urgency.  He has had 2 episodes of fecal incontinence, one today.  Vision is doing well.  He feels his mood has done better.  He feels less depressed.  Denies anxiety.  He has a long psych history with several suicide attempts in the past.  I encouraged him to follow-up again with psychiatry.  From 10/04/2016: He recently had a Psych admission at [facility] for depression ans suicidal ideation.    Abilify was added  (has only been on a few days).  MS:   He has been on Tecfidera since late 2013 with a gap earlier this year when he ran out.   Marland KitchenHe tolerates it well and has not had any definite exacerbation while on it. A recent lymphocyte count was good at 1.1 (09/23/16).    The MRI of the brain 07/2015 was stale.   It shows T2/FLAIR hyperintense foci in a pattern and configuration consistent with multiple sclerosis.   The MRI of the cervical spine shows some degenerative changes but no  MS plaques  Tremor:  He notes a rapid fine tremor in his hands that is not too bothersome.  Of note, lithium has recently been added but he did not have tremor the last time.    Tinnitus:   He notes a ringing tinnitus bilateral that has been present a while but seems worse.   Stutter:   He feels his stuttering speech is worse and "I can't half talk" .    notes a stuttering speech since being started on Lithium.    He finds this frustrating and also notes tha the has trouble coming up with the right words.     Gait/strength/sensation: He feels gait is worse.  He stumbles more.   He relies on his cane.   Balance is  poor. No recent fall.   He notes mild weakness in both legs associated with some spasticity. He denies weakness or numbness in the arms..   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   Bladder:  He notes mild hesitancy but this is stable.   He also has some urgency, frequency.   No incontinence or UTI.  e has occasional constipation but had diarrhea 2 weeks ago. He has not had any urinary or fecal incontinence in the past few years.    Vision:   He denies visual blurring.[name]  He notes diplopia when he looks either left or right.   He denies any eye pain.  Fatigue/sleep: He reports mild fatigue, about the same as usual for him.   He does not have to take  naps. Amantadine helped in the past but stopped helping so he stopped.[name] He sleeps well most nights getting 7 hours or so.     Mood/cognition:    He has bipolar disease and has had multiple inpatient psychiatric admissions with various changes of medications over the years. He is on lithium and Abilify.    He has some depression but feels he is fairly stable.    MS History:   He presented with double vision in 2000.  MRI at that time showed lesions in the brain consistent with multiple sclerosis. He also had a lumbar puncture and the CSF was consistent with MS.   He was started on Avonex.  He had another episode of diplopia followed by weakness in both legs 3 years later.    He was switched to Copaxone for a while. Unfortunate, he had an another severe exacerbation with his legs this. He was started on Tysabri once a month.   For several years, he stayed to [place] but stopped as it was difficult for him to get the infusions. In 2011, he switched to [place]. 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 He had one exacerbation with weakness in 2012. He then started Tecfidera in late 2013. He has generally tolerated it well. He did not have any significant flushing or GI side effects. He has not had any definite exacerbations while on Tecfidera.    REVIEW OF SYSTEMS: Out of a complete 14 system review of symptoms, the patient complains only of the following symptoms, weakness, depression, stutter, and all other reviewed systems are negative.   ALLERGIES: No Known Allergies   HOME MEDICATIONS: Outpatient Medications Prior to Visit  Medication Sig Dispense Refill  . folic acid (FOLVITE) 1 MG tablet Take 1 tablet (1 mg total) by mouth daily. 90 tablet 3  . OLANZapine (ZYPREXA) 10 MG tablet Take 1 tablet (10 mg total) by mouth at bedtime. 90 tablet 3  . sertraline (ZOLOFT) 50 MG tablet Take 1 tablet (50 mg total) by mouth daily. 90 tablet 3  . TECFIDERA 240  MG CPDR Take 1 capsule by mouth twice daily 60 capsule 11  . Vitamin D, Ergocalciferol, (DRISDOL) 1.25 MG (50000 UT) CAPS capsule Take 1 capsule (50,000 Units total) by mouth every 7 (seven) days. (Patient taking differently: Take 50,000 Units by mouth every Saturday.) 13 capsule 4   No facility-administered medications prior to visit.     PAST MEDICAL HISTORY: Past Medical History:  Diagnosis Date  . Depression   .  Mental disorder   . Movement disorder   . MS (multiple sclerosis) (HCC)   . Multiple sclerosis, relapsing-remitting (HCC)   . Neuromuscular disorder (HCC)   . Vision abnormalities      PAST SURGICAL HISTORY: Past Surgical History:  Procedure Laterality Date  . BIOPSY  10/17/2017   Procedure: BIOPSY;  Surgeon: Kathi Der, MD;  Location: [facility];  Service: Gastroenterology;;  . ESOPHAGOGASTRODUODENOSCOPY (EGD) WITH PROPOFOL N/A 10/17/2017   Procedure: ESOPHAGOGASTRODUODENOSCOPY (EGD) WITH PROPOFOL;  Surgeon: Kathi Der, MD;  Location: [facility];  Service: Gastroenterology;  Laterality: N/A;  . IR ANGIOGRAM SELECTIVE EACH ADDITIONAL VESSEL  10/21/2017  . IR ANGIOGRAM SELECTIVE EACH ADDITIONAL VESSEL  10/21/2017  . IR ANGIOGRAM VISCERAL SELECTIVE  10/21/2017  . IR EMBO ART  VEN HEMORR LYMPH EXTRAV  INC GUIDE ROADMAPPING  10/21/2017  . IR US GUIDE VASC ACCESS RIGHT  10/21/2017  . TONSILLECTOMY       FAMILY HISTORY: Family History  Problem Relation Age of Onset  . Heart disease Mother   . Stroke Father      SOCIAL HISTORY: Social History   Socioeconomic History  . Marital status: Legally Separated    Spouse name: Not on file  . Number of children: Not on file  . Years of education: Not on file  . Highest education level: Not on file  Occupational History  . Not on file  Tobacco Use  . Smoking status: Current Every Day Smoker    Packs/day: 1.00    Years: 36.00    Pack years: 36.00    Types: Cigarettes  . Smokeless tobacco: Never Used   Substance and Sexual Activity  . Alcohol use: Yes    Alcohol/week: 0.0 standard drinks    Comment: 11 beers a day x "many years"  . Drug use: Yes    Types: Marijuana    Comment: 1-2 times a month  . Sexual activity: Not Currently    Birth control/protection: None  Other Topics Concern  . Not on file  Social History Narrative   ** Merged History Encounter **       Social Determinants of Health   Financial Resource Strain: Not on file  Food Insecurity: Not on file  Transportation Needs: Not on file  Physical Activity: Not on file  Stress: Not on file  Social Connections: Not on file  Intimate Partner Violence: Not on file      PHYSICAL EXAM  Vitals:   05/05/20 0839  BP: 105/73  Pulse: 91  Weight: 166 lb 3.2 oz (75.4 kg)  Height: 5\' 11"  (1.803 m)   Body mass index is 23.18 kg/m.   Generalized: Well developed, in no acute distress  Cardiology: normal rate and rhythm, no murmur auscultated  Respiratory: clear to auscultation bilaterally    Neurological examination  Mentation: Alert, Flat affect, oriented to time, place, history taking. Follows all commands speech and language fluent with exception of intermittent mild stutter  Cranial nerve II-XII: Pupils were equal round reactive to light. Extraocular movements were full, visual field were full on confrontational test. Facial sensation and strength were normal. Uvula tongue midline. Head turning and shoulder shrug  were normal and symmetric. Motor: The motor testing reveals 5 over 5 strength of all 4 extremities with exception of 4/5 bilateral hip flexion. Motor tone increased in legs Sensory: Sensory testing is intact to soft touch on all 4 extremities. No evidence of extinction is noted.  Coordination: Cerebellar testing reveals good finger-nose-finger and heel-to-shin bilaterally.  Gait and station: Gait is short/shuffling. Stable with Rolator. Slow to move. Unable to Tandem.  Reflexes: Deep tendon reflexes are  symmetric and normal bilaterally.     DIAGNOSTIC DATA (LABS, IMAGING, TESTING) - I reviewed patient records, labs, notes, testing and imaging myself where available.  Lab Results  Component Value Date   WBC 5.2 09/17/2019   HGB 15.3 09/17/2019   HCT 46.1 09/17/2019   MCV 95 09/17/2019   PLT 203 09/17/2019      Component Value Date/Time   NA 139 08/19/2019 1926   NA 136 09/16/2018 0905   K 4.1 08/19/2019 1926   CL 108 08/19/2019 1926   CO2 18 (L) 08/19/2019 1926   GLUCOSE 77 08/19/2019 1926   BUN 25 (H) 08/19/2019 1926   BUN 10 09/16/2018 0905   CREATININE 1.11 08/19/2019 1926   CALCIUM 8.8 (L) 08/19/2019 1926   PROT 7.4 08/19/2019 1926   PROT 6.8 09/16/2018 0905   ALBUMIN 4.4 08/19/2019 1926   ALBUMIN 4.5 09/16/2018 0905   AST 23 08/19/2019 1926   ALT 25 08/19/2019 1926   ALKPHOS 42 08/19/2019 1926   BILITOT 0.4 08/19/2019 1926   BILITOT 0.4 09/16/2018 0905   GFRNONAA >60 08/19/2019 1926   GFRAA >60 08/19/2019 1926   Lab Results  Component Value Date   CHOL 177 07/11/2015   HDL 46 07/11/2015   LDLCALC 108 (H) 07/11/2015   TRIG 117 07/11/2015   CHOLHDL 3.8 07/11/2015   Lab Results  Component Value Date   HGBA1C  05/06/2009    5.6 (NOTE) The ADA recommends the following therapeutic goal for glycemic control related to Hgb A1c measurement: Goal of therapy: <6.5 Hgb A1c  Reference: American Diabetes Association: Clinical Practice Recommendations 2010, Diabetes Care, 2010, 33: (Suppl  1).   Lab Results  Component Value Date   VITAMINB12 180 10/17/2017   Lab Results  Component Value Date   TSH 7.010 (H) 04/05/2016    No flowsheet data found.   ASSESSMENT AND PLAN  65 y.o. year old male  has a past medical history of Depression, Mental disorder, Movement disorder, MS (multiple sclerosis) (HCC), Multiple sclerosis, relapsing-remitting (HCC), Neuromuscular disorder (HCC), and Vision abnormalities. here with   Relapsing remitting multiple sclerosis  (HCC)  High risk medication use  Bipolar I disorder (HCC)  Vitamin D deficiency  Gait disturbance  Suhaas is doing fairly well, today.  He continues Tecfidera, brand, and tolerating well.  He has remained does report that he took his last dose of medication this morning.  He has been in contact with [facility] to ensure that medication will be delivered today or tomorrow.  I have reviewed previous notes from [place] with [facility] who reported that he was not taken medication consistently.  Revanth and his roommate state that he has taken medication twice daily every day.  He was advised to reach out to me with any concerns of obtaining medication.  We have had a lengthy discussion regarding history of depression and suicidal ideations.  I have encouraged him to consider letting me refer him to psychiatry.  He is not interested at this time.  He denies any worsening depression.  No suicidal ideations.  He verbalizes understanding to reach out immediately should mood worsen.  I have encouraged him to continue using his Rollator.  Fortunately, he has not had as many falls recently.  I will request recent labs from primary care.  He will follow-up with Dr. Epimenio Foot in 6 months, sooner if needed.  He verbalizes understanding and agreement with this plan.   No orders of the defined types were placed in this encounter.     I spent 30 minutes of face-to-face and non-face-to-face time with patient.  This included previsit chart review, lab review, study review, order entry, electronic health record documentation, patient education.    Shawnie DapperAmy Mikeila Burgen, MSN, FNP-C 05/05/2020, 9:32 AM  [facility] [address], [address] [place], [place] [address] [phone]

## 2020-05-11 ENCOUNTER — Telehealth: Payer: Self-pay | Admitting: *Deleted

## 2020-05-11 NOTE — Telephone Encounter (Signed)
Called and LMVM for pts friend looking for most recent labs.  pts phone is NIS.  Tried Dr. Sharyn Lull no longer pt since 02-05-2018.

## 2020-06-01 ENCOUNTER — Encounter (HOSPITAL_COMMUNITY): Payer: Self-pay

## 2020-06-01 ENCOUNTER — Emergency Department (HOSPITAL_COMMUNITY): Payer: Medicare Other

## 2020-06-01 ENCOUNTER — Other Ambulatory Visit: Payer: Self-pay

## 2020-06-01 ENCOUNTER — Emergency Department (HOSPITAL_COMMUNITY)
Admission: EM | Admit: 2020-06-01 | Discharge: 2020-06-01 | Disposition: A | Discharge status: Home / Self Care | Payer: Medicare Other | Attending: Emergency Medicine | Admitting: Emergency Medicine

## 2020-06-01 DIAGNOSIS — S299XXA Unspecified injury of thorax, initial encounter: Secondary | ICD-10-CM | POA: Diagnosis present

## 2020-06-01 DIAGNOSIS — W01198A Fall on same level from slipping, tripping and stumbling with subsequent striking against other object, initial encounter: Secondary | ICD-10-CM | POA: Insufficient documentation

## 2020-06-01 DIAGNOSIS — S2242XA Multiple fractures of ribs, left side, initial encounter for closed fracture: Secondary | ICD-10-CM | POA: Insufficient documentation

## 2020-06-01 DIAGNOSIS — F17210 Nicotine dependence, cigarettes, uncomplicated: Secondary | ICD-10-CM | POA: Insufficient documentation

## 2020-06-01 MED ORDER — TRAMADOL HCL 50 MG PO TABS: 50.0000 mg | ORAL_TABLET | Freq: Four times a day (QID) | ORAL | 0 refills | Status: DC | PRN

## 2020-06-01 MED ORDER — IBUPROFEN 200 MG PO TABS
600.0000 mg | ORAL_TABLET | Freq: Once | ORAL | Status: AC
  Administered 2020-06-01: 600 mg via ORAL
  Filled 2020-06-01: qty 3

## 2020-06-01 NOTE — Discharge Instructions (Signed)
You have a fracture of the 8th and 9th ribs on the left chest wall.  These usually heal without the need for any type of surgery.  Follow up with your doctor in 3-4 days.  Return if you have fevers, trouble breathing, or worsening symptoms.

## 2020-06-01 NOTE — ED Triage Notes (Signed)
Patient arrived via [facility] with complaints of left sided pain after a fall two days ago. Declines LOC.

## 2020-06-01 NOTE — ED Notes (Signed)
[employer]. Voucher used.  Tried to reach roommate. Call going to Voicemail each time.

## 2020-06-01 NOTE — ED Provider Notes (Signed)
[facility]-EMERGENCY DEPT Provider Note   CSN: [id number] Arrival date & time: 06/01/20  1947     History Chief Complaint  Patient presents with  . Fall    COLONEL KRAUSER is a 65 y.o. male.  Patient presents with chief complaint of left-sided chest wall pain.  Symptoms been ongoing for 2 days.  2 days ago patient states he lost his balance and fell hit his left side on the ground and has persistent pain.  Denies fevers or cough.  No vomiting or diarrhea.  States it hurts when he takes a deep breath.  Denies headache neck pain or back pain otherwise.        Past Medical History:  Diagnosis Date  . Depression   . Mental disorder   . Movement disorder   . MS (multiple sclerosis) (HCC)   . Multiple sclerosis, relapsing-remitting (HCC)   . Neuromuscular disorder (HCC)   . Vision abnormalities     Patient Active Problem List   Diagnosis Date Noted  . Polysubstance abuse (HCC) 08/12/2019  . Substance induced mood disorder (HCC) 08/12/2019  . Vitamin D deficiency 09/16/2018  . Altered mental status   . Acute encephalopathy   . Goals of care, counseling/discussion   . Palliative care encounter   . GI bleed 10/18/2017  . Symptomatic anemia   . Upper GI bleeding 10/16/2017  . Coccygeal pain, acute 04/08/2017  . Elevated TSH 04/06/2016  . Bipolar I disorder (HCC) 04/05/2016  . 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 Numbness 01/16/2016  . Alcohol abuse 07/16/2015  . Other fatigue 06/29/2014  . Gait disturbance 06/29/2014  . Urinary urgency 06/29/2014  . High risk medication use 06/29/2014  . MS (multiple sclerosis) (HCC) 10/31/2011    Class: Chronic  . Dysthymia 05/11/2011    Past Surgical History:  Procedure Laterality Date  . BIOPSY  10/17/2017   Procedure: BIOPSY;  Surgeon: Kathi Der, MD;  Location: [facility];  Service: Gastroenterology;;  . ESOPHAGOGASTRODUODENOSCOPY (EGD) WITH PROPOFOL N/A 10/17/2017   Procedure:  ESOPHAGOGASTRODUODENOSCOPY (EGD) WITH PROPOFOL;  Surgeon: Kathi Der, MD;  Location: [facility];  Service: Gastroenterology;  Laterality: N/A;  . IR ANGIOGRAM SELECTIVE EACH ADDITIONAL VESSEL  10/21/2017  . IR ANGIOGRAM SELECTIVE EACH ADDITIONAL VESSEL  10/21/2017  . IR ANGIOGRAM VISCERAL SELECTIVE  10/21/2017  . IR EMBO ART  VEN HEMORR LYMPH EXTRAV  INC GUIDE ROADMAPPING  10/21/2017  . IR US GUIDE VASC ACCESS RIGHT  10/21/2017  . TONSILLECTOMY         Family History  Problem Relation Age of Onset  . Heart disease Mother   . Stroke Father     Social History   Tobacco Use  . Smoking status: Current Every Day Smoker    Packs/day: 1.00    Years: 36.00    Pack years: 36.00    Types: Cigarettes  . Smokeless tobacco: Never Used  Substance Use Topics  . Alcohol use: Yes    Alcohol/week: 0.0 standard drinks    Comment: 11 beers a day x "many years"  . Drug use: Yes    Types: Marijuana    Comment: 1-2 times a month    Home Medications Prior to Admission medications   Medication Sig Start Date End Date Taking? Authorizing Provider  traMADol (ULTRAM) 50 MG tablet Take 1 tablet (50 mg total) by mouth every 6 (six) hours as needed for up to 10 doses. 06/01/20  Yes Cheryll Cockayne, MD  folic acid (  FOLVITE) 1 MG tablet Take 1 tablet (1 mg total) by mouth daily. 09/16/18   Sater, Pearletha Furl, MD  OLANZapine (ZYPREXA) 10 MG tablet Take 1 tablet (10 mg total) by mouth at bedtime. 09/16/18   Sater, Pearletha Furl, MD  sertraline (ZOLOFT) 50 MG tablet Take 1 tablet (50 mg total) by mouth daily. 09/16/18   Sater, Pearletha Furl, MD  TECFIDERA 240 MG CPDR Take 1 capsule by mouth twice daily 12/31/19   Sater, Pearletha Furl, MD  Vitamin D, Ergocalciferol, (DRISDOL) 1.25 MG (50000 UT) CAPS capsule Take 1 capsule (50,000 Units total) by mouth every 7 (seven) days. Patient taking differently: Take 50,000 Units by mouth every Saturday. 09/18/18   Sater, Pearletha Furl, MD    Allergies    Patient has no known  allergies.  Review of Systems   Review of Systems  Constitutional: Negative for fever.  HENT: Negative for ear pain and sore throat.   Eyes: Negative for pain.  Respiratory: Negative for cough.   Cardiovascular: Positive for chest pain.  Gastrointestinal: Negative for abdominal pain.  Genitourinary: Negative for flank pain.  Musculoskeletal: Negative for back pain.  Skin: Negative for color change and rash.  Neurological: Negative for syncope.  All other systems reviewed and are negative.   Physical Exam Updated Vital Signs BP 115/74 (BP Location: Left Arm)   Pulse 88   Temp 97.8 F (36.6 C) (Oral)   Resp 18   Ht 5\' 11"  (1.803 m)   Wt 72.6 kg   SpO2 97%   BMI 22.32 kg/m   Physical Exam Constitutional:      General: He is not in acute distress.    Appearance: He is well-developed.  HENT:     Head: Normocephalic.     Nose: Nose normal.  Eyes:     Extraocular Movements: Extraocular movements intact.  Cardiovascular:     Rate and Rhythm: Normal rate.  Pulmonary:     Effort: Pulmonary effort is normal.  Musculoskeletal:     Comments: Bruising ecchymosis on the left upper posterior flank.  Tenderness palpation along this area.  Skin:    Coloration: Skin is not jaundiced.  Neurological:     Mental Status: He is alert. Mental status is at baseline.     ED Results / Procedures / Treatments   Labs (all labs ordered are listed, but only abnormal results are displayed) Labs Reviewed - No data to display  EKG None  Radiology DG Ribs Unilateral W/Chest Left  Result Date: 06/01/2020 CLINICAL DATA:  Pain with breathing, left-sided pain after fall 2 days prior EXAM: LEFT RIBS AND CHEST - 3+ VIEW COMPARISON:  Radiograph 11/01/2017 FINDINGS: Visible fractures of the lateral left eighth and ninth ribs. Minimal adjacent pleural thickening could reflect trace pleural or extrapleural fluid. Some hazy opacities in the lung bases are favored to be atelectatic, likely related to  splinting. No visible pneumothorax is seen. The aorta is calcified. The remaining cardiomediastinal contours are unremarkable. No other acute or suspicious osseous abnormalities. Degenerative changes in the shoulders and spine. IMPRESSION: 1. Minimally displaced fractures of the lateral left eighth and ninth ribs with minimal adjacent pleural thickening which could reflect trace pleural or extrapleural fluid. No pneumothorax. 2. Hazy opacities in the lung bases are favored to be atelectatic, likely related to splinting. Electronically Signed   By: 01/02/2018 M.D.   On: 06/01/2020 20:19    Procedures Procedures   Medications Ordered in ED Medications  ibuprofen (ADVIL) tablet 600 mg (  600 mg Oral Given 06/01/20 2059)    ED Course  I have reviewed the triage vital signs and the nursing notes.  Pertinent labs & imaging results that were available during my care of the patient were reviewed by me and considered in my medical decision making (see chart for details).    MDM Rules/Calculators/A&P                          Chest x-ray shows 2 rib fractures on the left.  No evidence of pneumothorax.  Patient advised continued pain management at home and follow-up with his doctor in 2 or 3 days.  Advised immediate return for worsening pain fevers or any additional concerns.   Final Clinical Impression(s) / ED Diagnoses Final diagnoses:  Closed fracture of multiple ribs of left side, initial encounter    Rx / DC Orders ED Discharge Orders         Ordered    traMADol (ULTRAM) 50 MG tablet  Every 6 hours PRN        06/01/20 2205           Cheryll Cockayne, MD 06/01/20 2205

## 2020-07-04 ENCOUNTER — Telehealth: Payer: Self-pay | Admitting: *Deleted

## 2020-07-04 DIAGNOSIS — Z0289 Encounter for other administrative examinations: Secondary | ICD-10-CM

## 2020-07-04 NOTE — Telephone Encounter (Signed)
Received at my desk 06-30-20. Completed, to AL/NP for review completion and signature.

## 2020-07-06 NOTE — Telephone Encounter (Signed)
Completed. Returned to [employer].

## 2020-07-06 NOTE — Telephone Encounter (Signed)
To MR.  (made copy sy)

## 2020-07-07 ENCOUNTER — Telehealth: Payer: Self-pay | Admitting: *Deleted

## 2020-07-07 NOTE — Telephone Encounter (Signed)
Pt form faxed to [employer] [facility] on 07/07/20

## 2020-11-02 ENCOUNTER — Encounter: Payer: Self-pay | Admitting: Neurology

## 2020-11-02 ENCOUNTER — Ambulatory Visit: Payer: Medicare Other | Admitting: Neurology

## 2020-12-23 ENCOUNTER — Emergency Department (HOSPITAL_COMMUNITY): Payer: Medicare Other

## 2020-12-23 ENCOUNTER — Other Ambulatory Visit: Payer: Self-pay

## 2020-12-23 ENCOUNTER — Inpatient Hospital Stay (HOSPITAL_COMMUNITY)
Admission: EM | Admit: 2020-12-23 | Discharge: 2021-01-05 | DRG: 871 | Disposition: A | Discharge status: Hospice | Payer: Medicare Other | Attending: Internal Medicine | Admitting: Internal Medicine

## 2020-12-23 ENCOUNTER — Encounter (HOSPITAL_COMMUNITY): Payer: Self-pay | Admitting: Emergency Medicine

## 2020-12-23 DIAGNOSIS — F0390 Unspecified dementia without behavioral disturbance: Secondary | ICD-10-CM | POA: Diagnosis present

## 2020-12-23 DIAGNOSIS — J690 Pneumonitis due to inhalation of food and vomit: Secondary | ICD-10-CM | POA: Diagnosis present

## 2020-12-23 DIAGNOSIS — E43 Unspecified severe protein-calorie malnutrition: Secondary | ICD-10-CM | POA: Diagnosis present

## 2020-12-23 DIAGNOSIS — R6521 Severe sepsis with septic shock: Secondary | ICD-10-CM | POA: Diagnosis present

## 2020-12-23 DIAGNOSIS — R7989 Other specified abnormal findings of blood chemistry: Secondary | ICD-10-CM | POA: Diagnosis present

## 2020-12-23 DIAGNOSIS — I2699 Other pulmonary embolism without acute cor pulmonale: Secondary | ICD-10-CM

## 2020-12-23 DIAGNOSIS — J9601 Acute respiratory failure with hypoxia: Principal | ICD-10-CM

## 2020-12-23 DIAGNOSIS — G35 Multiple sclerosis: Secondary | ICD-10-CM | POA: Diagnosis present

## 2020-12-23 DIAGNOSIS — A419 Sepsis, unspecified organism: Principal | ICD-10-CM | POA: Diagnosis present

## 2020-12-23 DIAGNOSIS — F1010 Alcohol abuse, uncomplicated: Secondary | ICD-10-CM | POA: Diagnosis present

## 2020-12-23 DIAGNOSIS — M6282 Rhabdomyolysis: Secondary | ICD-10-CM | POA: Diagnosis present

## 2020-12-23 DIAGNOSIS — Z9119 Patient's noncompliance with other medical treatment and regimen: Secondary | ICD-10-CM

## 2020-12-23 DIAGNOSIS — Z4659 Encounter for fitting and adjustment of other gastrointestinal appliance and device: Secondary | ICD-10-CM

## 2020-12-23 DIAGNOSIS — R1310 Dysphagia, unspecified: Secondary | ICD-10-CM | POA: Diagnosis present

## 2020-12-23 DIAGNOSIS — J449 Chronic obstructive pulmonary disease, unspecified: Secondary | ICD-10-CM | POA: Diagnosis present

## 2020-12-23 DIAGNOSIS — Z66 Do not resuscitate: Secondary | ICD-10-CM | POA: Diagnosis not present

## 2020-12-23 DIAGNOSIS — Z8249 Family history of ischemic heart disease and other diseases of the circulatory system: Secondary | ICD-10-CM

## 2020-12-23 DIAGNOSIS — K5900 Constipation, unspecified: Secondary | ICD-10-CM | POA: Diagnosis present

## 2020-12-23 DIAGNOSIS — R627 Adult failure to thrive: Secondary | ICD-10-CM | POA: Diagnosis present

## 2020-12-23 DIAGNOSIS — R64 Cachexia: Secondary | ICD-10-CM | POA: Diagnosis present

## 2020-12-23 DIAGNOSIS — Z781 Physical restraint status: Secondary | ICD-10-CM

## 2020-12-23 DIAGNOSIS — Z9114 Patient's other noncompliance with medication regimen: Secondary | ICD-10-CM

## 2020-12-23 DIAGNOSIS — Z1389 Encounter for screening for other disorder: Secondary | ICD-10-CM

## 2020-12-23 DIAGNOSIS — F319 Bipolar disorder, unspecified: Secondary | ICD-10-CM | POA: Diagnosis present

## 2020-12-23 DIAGNOSIS — R9082 White matter disease, unspecified: Secondary | ICD-10-CM | POA: Diagnosis present

## 2020-12-23 DIAGNOSIS — E860 Dehydration: Secondary | ICD-10-CM | POA: Diagnosis present

## 2020-12-23 DIAGNOSIS — F17210 Nicotine dependence, cigarettes, uncomplicated: Secondary | ICD-10-CM | POA: Diagnosis present

## 2020-12-23 DIAGNOSIS — I2609 Other pulmonary embolism with acute cor pulmonale: Secondary | ICD-10-CM | POA: Diagnosis present

## 2020-12-23 DIAGNOSIS — Z79899 Other long term (current) drug therapy: Secondary | ICD-10-CM

## 2020-12-23 DIAGNOSIS — L89209 Pressure ulcer of unspecified hip, unspecified stage: Secondary | ICD-10-CM | POA: Diagnosis present

## 2020-12-23 DIAGNOSIS — L89210 Pressure ulcer of right hip, unstageable: Secondary | ICD-10-CM | POA: Diagnosis present

## 2020-12-23 DIAGNOSIS — Z6820 Body mass index (BMI) 20.0-20.9, adult: Secondary | ICD-10-CM

## 2020-12-23 DIAGNOSIS — E870 Hyperosmolality and hypernatremia: Secondary | ICD-10-CM | POA: Diagnosis present

## 2020-12-23 DIAGNOSIS — G9340 Encephalopathy, unspecified: Secondary | ICD-10-CM | POA: Diagnosis present

## 2020-12-23 DIAGNOSIS — Z515 Encounter for palliative care: Secondary | ICD-10-CM

## 2020-12-23 DIAGNOSIS — Z20822 Contact with and (suspected) exposure to covid-19: Secondary | ICD-10-CM | POA: Diagnosis present

## 2020-12-23 DIAGNOSIS — G9341 Metabolic encephalopathy: Secondary | ICD-10-CM | POA: Diagnosis present

## 2020-12-23 DIAGNOSIS — F1910 Other psychoactive substance abuse, uncomplicated: Secondary | ICD-10-CM | POA: Diagnosis present

## 2020-12-23 DIAGNOSIS — R5381 Other malaise: Secondary | ICD-10-CM | POA: Diagnosis present

## 2020-12-23 DIAGNOSIS — R0602 Shortness of breath: Secondary | ICD-10-CM

## 2020-12-23 DIAGNOSIS — F1210 Cannabis abuse, uncomplicated: Secondary | ICD-10-CM | POA: Diagnosis present

## 2020-12-23 DIAGNOSIS — Z0189 Encounter for other specified special examinations: Secondary | ICD-10-CM

## 2020-12-23 LAB — COMPREHENSIVE METABOLIC PANEL
ALT: 35 U/L (ref 0–44)
AST: 74 U/L — ABNORMAL HIGH (ref 15–41)
Albumin: 3 g/dL — ABNORMAL LOW (ref 3.5–5.0)
Alkaline Phosphatase: 68 U/L (ref 38–126)
Anion gap: 12 (ref 5–15)
BUN: 27 mg/dL — ABNORMAL HIGH (ref 8–23)
CO2: 19 mmol/L — ABNORMAL LOW (ref 22–32)
Calcium: 9 mg/dL (ref 8.9–10.3)
Chloride: 107 mmol/L (ref 98–111)
Creatinine, Ser: 1.24 mg/dL (ref 0.61–1.24)
GFR, Estimated: >60 mL/min (ref >60)
Glucose, Bld: 93 mg/dL (ref 70–99)
Potassium: 5.1 mmol/L (ref 3.5–5.1)
Sodium: 138 mmol/L (ref 135–145)
Total Bilirubin: 1.5 mg/dL — ABNORMAL HIGH (ref 0.3–1.2)
Total Protein: 6.5 g/dL (ref 6.5–8.1)

## 2020-12-23 LAB — CBC WITH DIFFERENTIAL/PLATELET
Abs Immature Granulocytes: 0.02 10*3/uL (ref 0.00–0.07)
Basophils Absolute: 0 10*3/uL (ref 0.0–0.1)
Basophils Relative: 0 %
Eosinophils Absolute: 0.1 10*3/uL (ref 0.0–0.5)
Eosinophils Relative: 1 %
HCT: 37.5 % — ABNORMAL LOW (ref 39.0–52.0)
Hemoglobin: 11.8 g/dL — ABNORMAL LOW (ref 13.0–17.0)
Immature Granulocytes: 0 %
Lymphocytes Relative: 10 %
Lymphs Abs: 0.7 10*3/uL (ref 0.7–4.0)
MCH: 30.6 pg (ref 26.0–34.0)
MCHC: 31.5 g/dL (ref 30.0–36.0)
MCV: 97.4 fL (ref 80.0–100.0)
Monocytes Absolute: 0.6 10*3/uL (ref 0.1–1.0)
Monocytes Relative: 9 %
Neutro Abs: 5.7 10*3/uL (ref 1.7–7.7)
Neutrophils Relative %: 80 %
Platelets: 241 10*3/uL (ref 150–400)
RBC: 3.85 MIL/uL — ABNORMAL LOW (ref 4.22–5.81)
RDW: 13.9 % (ref 11.5–15.5)
WBC: 7.1 10*3/uL (ref 4.0–10.5)
nRBC: 0 % (ref 0.0–0.2)

## 2020-12-23 LAB — LACTIC ACID, PLASMA: Lactic Acid, Venous: 1.8 mmol/L (ref 0.5–1.9)

## 2020-12-23 LAB — PROTIME-INR
INR: 1 (ref 0.8–1.2)
Prothrombin Time: 13.6 seconds (ref 11.4–15.2)

## 2020-12-23 LAB — RESP PANEL BY RT-PCR (FLU A&B, COVID) ARPGX2
Influenza A by PCR: NEGATIVE
Influenza B by PCR: NEGATIVE
SARS Coronavirus 2 by RT PCR: NEGATIVE

## 2020-12-23 LAB — CK: Total CK: 2476 U/L — ABNORMAL HIGH (ref 49–397)

## 2020-12-23 NOTE — ED Triage Notes (Signed)
Pt here by [facility] for failure to thrive and possible sepsis; roommate states he has been laying in one spot for 3 days covered in feces and urine with legs and arms contracted that roommate states is new; per EMS pt has hx of MS; pressure ulcer on right hip; v.s 92% on RA, RR30; BP 108/72

## 2020-12-23 NOTE — ED Provider Notes (Signed)
I saw and evaluated the patient, reviewed the resident's note and I agree with the findings and plan.  Patient presents due to failure to thrive.  He has had decreased oral intake.  Has been lying in his own waist.  Patient clinically looks dehydrated.  Will perform labs start IV fluids and likely admit   Lorre Nick, MD 12/23/20 2201

## 2020-12-23 NOTE — ED Notes (Signed)
EDP at bedside for MSE.  

## 2020-12-23 NOTE — ED Provider Notes (Addendum)
[facility]   CSN: [id number] Arrival date & time: 12/23/20  2106     History Chief Complaint  Patient presents with   Failure To Thrive    [name] is a 65 y.o. male with PMHx MS, BPD, alcohol abuse who presents for evaluation of failure to thrive.  Patient is poor historian and unable to contribute much to history.  Collateral history is obtained from EMS.  Patient reports a several day history of generalized weakness.  Reportedly, his roommate called EMS because the patient has been laying in bed for the past 3 days, covered in feces and urine.  However this time, the patient had been refusing assistance.  He has been unable to get up out of bed and ambulate due to worsening contractures compared to his baseline.  He has hardly been eating or drinking over this time.  EMS was called, the patient was subsequently transported to our emergency department for further evaluation.      Past Medical History:  Diagnosis Date   Depression    Mental disorder    Movement disorder    MS (multiple sclerosis) (HCC)    Multiple sclerosis, relapsing-remitting (HCC)    Neuromuscular disorder (HCC)    Vision abnormalities     Patient Active Problem List   Diagnosis Date Noted   Polysubstance abuse (HCC) 08/12/2019   Substance induced mood disorder (HCC) 08/12/2019   Vitamin D deficiency 09/16/2018   Altered mental status    Acute encephalopathy    Goals of care, counseling/discussion    Palliative care encounter    GI bleed 10/18/2017   Symptomatic anemia    Upper GI bleeding 10/16/2017   Coccygeal pain, acute 04/08/2017   Elevated TSH 04/06/2016   Bipolar I disorder (HCC) 04/05/2016   Lateral femoral cutaneous neuropathy, 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   Numbness 01/16/2016   Alcohol abuse 07/16/2015   Other fatigue 06/29/2014   Gait disturbance 06/29/2014   Urinary urgency 06/29/2014   High risk medication use 06/29/2014   MS (multiple  sclerosis) (HCC) 10/31/2011    Class: Chronic   Dysthymia 05/11/2011    Past Surgical History:  Procedure Laterality Date   BIOPSY  10/17/2017   Procedure: BIOPSY;  Surgeon: Kathi DerBrahmbhatt, Parag, MD;  Location: [facility];  Service: Gastroenterology;;   ESOPHAGOGASTRODUODENOSCOPY (EGD) WITH PROPOFOL N/A 10/17/2017   Procedure: ESOPHAGOGASTRODUODENOSCOPY (EGD) WITH PROPOFOL;  Surgeon: Kathi DerBrahmbhatt, Parag, MD;  Location: [facility];  Service: Gastroenterology;  Laterality: N/A;   IR ANGIOGRAM SELECTIVE EACH ADDITIONAL VESSEL  10/21/2017   IR ANGIOGRAM SELECTIVE EACH ADDITIONAL VESSEL  10/21/2017   IR ANGIOGRAM VISCERAL SELECTIVE  10/21/2017   IR EMBO ART  VEN HEMORR LYMPH EXTRAV  INC GUIDE ROADMAPPING  10/21/2017   IR US GUIDE VASC ACCESS RIGHT  10/21/2017   TONSILLECTOMY         Family History  Problem Relation Age of Onset   Heart disease Mother    Stroke Father     Social History   Tobacco Use   Smoking status: Every Day    Packs/day: 1.00    Years: 36.00    Pack years: 36.00    Types: Cigarettes   Smokeless tobacco: Never  Substance Use Topics   Alcohol use: Yes    Alcohol/week: 0.0 standard drinks    Comment: 11 beers a day x "many years"   Drug use: Yes    Types: Marijuana    Comment: 1-2 times a month  Home Medications Prior to Admission medications   Medication Sig Start Date End Date Taking? Authorizing Provider  folic acid (FOLVITE) 1 MG tablet Take 1 tablet (1 mg total) by mouth daily. 09/16/18   Sater, Pearletha Furl, MD  OLANZapine (ZYPREXA) 10 MG tablet Take 1 tablet (10 mg total) by mouth at bedtime. 09/16/18   Sater, Pearletha Furl, MD  sertraline (ZOLOFT) 50 MG tablet Take 1 tablet (50 mg total) by mouth daily. 09/16/18   Sater, Pearletha Furl, MD  TECFIDERA 240 MG CPDR Take 1 capsule by mouth twice daily 12/31/19   Sater, Pearletha Furl, MD  traMADol (ULTRAM) 50 MG tablet Take 1 tablet (50 mg total) by mouth every 6 (six) hours as needed for up to 10 doses. 06/01/20   Cheryll Cockayne, MD   Vitamin D, Ergocalciferol, (DRISDOL) 1.25 MG (50000 UT) CAPS capsule Take 1 capsule (50,000 Units total) by mouth every 7 (seven) days. Patient taking differently: Take 50,000 Units by mouth every Saturday. 09/18/18   Sater, Pearletha Furl, MD    Allergies    Patient has no known allergies.  Review of Systems   Review of Systems  Constitutional:  Positive for activity change, appetite change and fatigue. Negative for chills and fever.  HENT:  Negative for ear pain and sore throat.   Eyes:  Negative for pain and visual disturbance.  Respiratory:  Negative for cough and shortness of breath.   Cardiovascular:  Negative for chest pain and palpitations.  Gastrointestinal:  Negative for abdominal pain and vomiting.  Genitourinary:  Negative for dysuria and hematuria.  Musculoskeletal:  Positive for myalgias. Negative for arthralgias and back pain.  Skin:  Positive for rash and wound.  Neurological:  Positive for weakness and light-headedness. Negative for seizures and syncope.  All other systems reviewed and are negative.  Physical Exam Updated Vital Signs BP 121/80 (BP Location: Left Arm)   Pulse 81   Temp 99.8 F (37.7 C) (Oral)   Resp (!) 21   Ht 5\' 11"  (1.803 m)   Wt 72.6 kg   SpO2 98%   BMI 22.32 kg/m   Physical Exam Vitals and nursing note reviewed.  Constitutional:      Appearance: He is well-developed and underweight. He is ill-appearing.  HENT:     Head: Normocephalic and atraumatic.     Mouth/Throat:     Mouth: Mucous membranes are dry.  Eyes:     Conjunctiva/sclera: Conjunctivae normal.  Cardiovascular:     Rate and Rhythm: Normal rate and regular rhythm.     Heart sounds: No murmur heard. Pulmonary:     Effort: Pulmonary effort is normal. No respiratory distress.     Breath sounds: Normal breath sounds.  Abdominal:     Palpations: Abdomen is soft.     Tenderness: no abdominal tenderness  Musculoskeletal:     Cervical back: Normal range of motion and neck  supple.  Skin:    General: Skin is warm and dry.     Capillary Refill: Capillary refill takes 2 to 3 seconds.     Comments: Decubitus ulcer with surrounding erythema over right lateral hip.  Surrounding skin breakdown.  Neurological:     General: No focal deficit present.     Mental Status: He is alert. He is disoriented.    ED Results / Procedures / Treatments   Labs (all labs ordered are listed, but only abnormal results are displayed) Labs Reviewed  COMPREHENSIVE METABOLIC PANEL - Abnormal; Notable for the following components:  Result Value   CO2 19 (*)    BUN 27 (*)    Albumin 3.0 (*)    AST 74 (*)    Total Bilirubin 1.5 (*)    All other components within normal limits  CBC WITH DIFFERENTIAL/PLATELET - Abnormal; Notable for the following components:   RBC 3.85 (*)    Hemoglobin 11.8 (*)    HCT 37.5 (*)    All other components within normal limits  URINALYSIS, ROUTINE W REFLEX MICROSCOPIC - Abnormal; Notable for the following components:   Ketones, ur 20 (*)    All other components within normal limits  CK - Abnormal; Notable for the following components:   Total CK 2,476 (*)    All other components within normal limits  RESP PANEL BY RT-PCR (FLU A&B, COVID) ARPGX2  CULTURE, BLOOD (ROUTINE X 2)  CULTURE, BLOOD (ROUTINE X 2)  URINE CULTURE  LACTIC ACID, PLASMA  PROTIME-INR    EKG None  Radiology CT HEAD WO CONTRAST ( )  Result Date: 12/24/2020 CLINICAL DATA:  Mental status change, unknown cause EXAM: CT HEAD WITHOUT CONTRAST TECHNIQUE: Contiguous axial images were obtained from the base of the skull through the vertex without intravenous contrast. COMPARISON:  10/31/2017 FINDINGS: Brain: There is atrophy and chronic small vessel disease changes. No acute intracranial abnormality. Specifically, no hemorrhage, hydrocephalus, mass lesion, acute infarction, or significant intracranial injury. Vascular: No hyperdense vessel or unexpected calcification. Skull: No  acute calvarial abnormality. Sinuses/Orbits: No acute findings Other: None IMPRESSION: Atrophy, chronic microvascular disease. No acute intracranial abnormality. Electronically Signed   By: Charlett Nose M.D.   On: 12/24/2020 00:57   DG Pelvis Portable  Result Date: 12/23/2020 CLINICAL DATA:  Sepsis EXAM: PORTABLE PELVIS 1-2 VIEWS COMPARISON:  None. FINDINGS: Mild degenerative changes in the hips bilaterally with joint space narrowing and spurring. No acute bony abnormality. Specifically, no fracture, subluxation, or dislocation. IMPRESSION: No acute bony abnormality. Electronically Signed   By: Charlett Nose M.D.   On: 12/23/2020 22:00   DG Chest Portable 1 View  Result Date: 12/23/2020 CLINICAL DATA:  Sepsis EXAM: PORTABLE CHEST 1 VIEW COMPARISON:  06/01/2020 FINDINGS: Heart is normal size. Lungs clear. No confluent airspace opacities or effusions. Healing left rib fractures again noted. IMPRESSION: No acute cardiopulmonary disease. Electronically Signed   By: Charlett Nose M.D.   On: 12/23/2020 21:59    Procedures Procedures   Medications Ordered in ED Medications  lactated ringers bolus 1,000 mL (1,000 mLs Intravenous New Bag/Given 12/24/20 0030)    ED Course  I have reviewed the triage vital signs and the nursing notes.  Pertinent labs & imaging results that were available during my care of the patient were reviewed by me and considered in my medical decision making (see chart for details).    MDM Rules/Calculators/A&P                           65 y.o. male with past medical history as above who presents for failure to thrive and altered mental status.  Afebrile and hemodynamically stable here.  On exam, patient looks clinically dehydrated.  He also has a decubitus ulcer on his right lateral hip, with surrounding erythema.  He is disoriented with no focal neurologic deficits.  Patient was given LR bolus.  CBC notable for noncritical anemia with hemoglobin 11.8.  Mild non-anion gap metabolic  acidosis with bicarbonate 19.  AST 74.  Total bilirubin 1.5.  CK 2476.  Ketonuria  on urinalysis.  CT head with no evidence of acute intracranial abnormality.  Chest x-ray with no acute cardiopulmonary disease.  Upon reassessment, patient is persistently encephalopathic.  Given failure to thrive, dehydration, and encephalopathy, patient will require hospital admission.  Patient was admitted to hospitalist.   Final Clinical Impression(s) / ED Diagnoses Final diagnoses:  Encounter for imaging to screen for metal prior to MRI    Rx / DC Orders ED Discharge Orders     None          Holley Dexter, MD 12/24/20 1320    Lorre Nick, MD 12/26/20 2233

## 2020-12-24 ENCOUNTER — Emergency Department (HOSPITAL_COMMUNITY): Payer: Medicare Other

## 2020-12-24 ENCOUNTER — Observation Stay (HOSPITAL_COMMUNITY): Payer: Medicare Other

## 2020-12-24 DIAGNOSIS — F1910 Other psychoactive substance abuse, uncomplicated: Secondary | ICD-10-CM

## 2020-12-24 DIAGNOSIS — F319 Bipolar disorder, unspecified: Secondary | ICD-10-CM | POA: Diagnosis not present

## 2020-12-24 DIAGNOSIS — L89209 Pressure ulcer of unspecified hip, unspecified stage: Secondary | ICD-10-CM | POA: Diagnosis not present

## 2020-12-24 DIAGNOSIS — G9340 Encephalopathy, unspecified: Secondary | ICD-10-CM | POA: Diagnosis not present

## 2020-12-24 DIAGNOSIS — G35 Multiple sclerosis: Secondary | ICD-10-CM

## 2020-12-24 DIAGNOSIS — M6282 Rhabdomyolysis: Secondary | ICD-10-CM | POA: Diagnosis present

## 2020-12-24 DIAGNOSIS — F1010 Alcohol abuse, uncomplicated: Secondary | ICD-10-CM | POA: Diagnosis not present

## 2020-12-24 LAB — BLOOD CULTURE ID PANEL (REFLEXED) - BCID2

## 2020-12-24 LAB — RAPID URINE DRUG SCREEN, HOSP PERFORMED
Amphetamines: NOT DETECTED
Barbiturates: NOT DETECTED
Benzodiazepines: NOT DETECTED
Cocaine: NOT DETECTED
Opiates: NOT DETECTED
Tetrahydrocannabinol: POSITIVE — AB

## 2020-12-24 LAB — COMPREHENSIVE METABOLIC PANEL
ALT: 32 U/L (ref 0–44)
AST: 62 U/L — ABNORMAL HIGH (ref 15–41)
Albumin: 3 g/dL — ABNORMAL LOW (ref 3.5–5.0)
Alkaline Phosphatase: 71 U/L (ref 38–126)
Anion gap: 12 (ref 5–15)
BUN: 27 mg/dL — ABNORMAL HIGH (ref 8–23)
CO2: 17 mmol/L — ABNORMAL LOW (ref 22–32)
Calcium: 9 mg/dL (ref 8.9–10.3)
Chloride: 109 mmol/L (ref 98–111)
Creatinine, Ser: 1.07 mg/dL (ref 0.61–1.24)
GFR, Estimated: >60 mL/min (ref >60)
Glucose, Bld: 95 mg/dL (ref 70–99)
Potassium: 4.6 mmol/L (ref 3.5–5.1)
Sodium: 138 mmol/L (ref 135–145)
Total Bilirubin: 1.5 mg/dL — ABNORMAL HIGH (ref 0.3–1.2)
Total Protein: 6.6 g/dL (ref 6.5–8.1)

## 2020-12-24 LAB — URINALYSIS, ROUTINE W REFLEX MICROSCOPIC
Bilirubin Urine: NEGATIVE
Glucose, UA: NEGATIVE mg/dL
Hgb urine dipstick: NEGATIVE
Ketones, ur: 20 mg/dL — AB
Leukocytes,Ua: NEGATIVE
Nitrite: NEGATIVE
Protein, ur: NEGATIVE mg/dL
Specific Gravity, Urine: 1.025 (ref 1.005–1.030)
pH: 5 (ref 5.0–8.0)

## 2020-12-24 LAB — PROTIME-INR
INR: 1 (ref 0.8–1.2)
Prothrombin Time: 13.5 seconds (ref 11.4–15.2)

## 2020-12-24 LAB — HIV ANTIBODY (ROUTINE TESTING W REFLEX): HIV Screen 4th Generation wRfx: NONREACTIVE

## 2020-12-24 LAB — AMMONIA: Ammonia: 23 umol/L (ref 9–35)

## 2020-12-24 LAB — PHOSPHORUS: Phosphorus: 3.6 mg/dL (ref 2.5–4.6)

## 2020-12-24 LAB — MAGNESIUM: Magnesium: 2.1 mg/dL (ref 1.7–2.4)

## 2020-12-24 LAB — ETHANOL: Alcohol, Ethyl (B): <10 mg/dL (ref <10)

## 2020-12-24 MED ORDER — LORAZEPAM 1 MG PO TABS: 1.0000 mg | ORAL_TABLET | ORAL | Status: AC | PRN

## 2020-12-24 MED ORDER — THIAMINE HCL 100 MG PO TABS
100.0000 mg | ORAL_TABLET | Freq: Every day | ORAL | Status: DC
  Administered 2020-12-24 – 2020-12-30 (×6): 100 mg via ORAL
  Filled 2020-12-24 (×7): qty 1

## 2020-12-24 MED ORDER — LACTATED RINGERS IV SOLN: INTRAVENOUS | Status: DC

## 2020-12-24 MED ORDER — ADULT MULTIVITAMIN W/MINERALS CH
1.0000 | ORAL_TABLET | Freq: Every day | ORAL | Status: DC
  Administered 2020-12-24 – 2020-12-30 (×6): 1 via ORAL
  Filled 2020-12-24 (×7): qty 1

## 2020-12-24 MED ORDER — ONDANSETRON HCL 4 MG/2ML IJ SOLN
4.0000 mg | Freq: Four times a day (QID) | INTRAMUSCULAR | Status: DC | PRN
Start: 2020-12-24 — End: 2021-01-04

## 2020-12-24 MED ORDER — ONDANSETRON HCL 4 MG PO TABS
4.0000 mg | ORAL_TABLET | Freq: Four times a day (QID) | ORAL | Status: DC | PRN
Start: 2020-12-24 — End: 2021-01-04

## 2020-12-24 MED ORDER — GADOBUTROL 1 MMOL/ML IV SOLN
7.5000 mL | Freq: Once | INTRAVENOUS | Status: AC | PRN
  Administered 2020-12-24: 7.5 mL via INTRAVENOUS

## 2020-12-24 MED ORDER — GERHARDT'S BUTT CREAM
TOPICAL_CREAM | Freq: Three times a day (TID) | CUTANEOUS | Status: AC
  Administered 2020-12-28 – 2020-12-29 (×3): 1 via TOPICAL
  Filled 2020-12-24 (×2): qty 1

## 2020-12-24 MED ORDER — VANCOMYCIN HCL 1500 MG/300ML IV SOLN
1500.0000 mg | Freq: Once | INTRAVENOUS | Status: AC
  Administered 2020-12-24: 1500 mg via INTRAVENOUS
  Filled 2020-12-24: qty 300

## 2020-12-24 MED ORDER — THIAMINE HCL 100 MG/ML IJ SOLN
100.0000 mg | Freq: Every day | INTRAMUSCULAR | Status: DC
  Filled 2020-12-24: qty 2

## 2020-12-24 MED ORDER — THIAMINE HCL 100 MG/ML IJ SOLN
500.0000 mg | Freq: Once | INTRAVENOUS | Status: AC
  Administered 2020-12-24: 500 mg via INTRAVENOUS
  Filled 2020-12-24: qty 5

## 2020-12-24 MED ORDER — ENSURE ENLIVE PO LIQD
237.0000 mL | Freq: Two times a day (BID) | ORAL | Status: DC
  Administered 2020-12-25 – 2020-12-26 (×3): 237 mL via ORAL

## 2020-12-24 MED ORDER — LACTATED RINGERS IV BOLUS
1000.0000 mL | Freq: Once | INTRAVENOUS | Status: AC
  Administered 2020-12-24: 1000 mL via INTRAVENOUS

## 2020-12-24 MED ORDER — LORAZEPAM 2 MG/ML IJ SOLN: 1.0000 mg | INTRAMUSCULAR | Status: AC | PRN

## 2020-12-24 MED ORDER — FOLIC ACID 1 MG PO TABS
1.0000 mg | ORAL_TABLET | Freq: Every day | ORAL | Status: DC
  Administered 2020-12-24 – 2020-12-30 (×6): 1 mg via ORAL
  Filled 2020-12-24 (×7): qty 1

## 2020-12-24 MED ORDER — ENOXAPARIN SODIUM 40 MG/0.4ML IJ SOSY: 40.0000 mg | PREFILLED_SYRINGE | Freq: Every day | INTRAMUSCULAR | Status: DC

## 2020-12-24 MED ORDER — VANCOMYCIN HCL 1250 MG/250ML IV SOLN: 1250.0000 mg | INTRAVENOUS | Status: DC

## 2020-12-24 NOTE — Progress Notes (Signed)
PHARMACY - PHYSICIAN COMMUNICATION CRITICAL VALUE ALERT - BLOOD CULTURE IDENTIFICATION (BCID)  [name] is an 65 y.o. male who presented to [facility] on 12/23/2020 with a chief complaint of confusion, found down by roommate. BCID with staph epidermidis 2/4 bottles    Name of physician (or Provider) Contacted: none- no changes needed    Current antibiotics: vancomycin    Results for orders placed or performed during the hospital encounter of 12/23/20  Blood Culture ID Panel (Reflexed) (Collected: 12/23/2020 10:15 PM)  Result Value Ref Range   Enterococcus faecalis NOT DETECTED NOT DETECTED   Enterococcus Faecium NOT DETECTED NOT DETECTED   Listeria monocytogenes NOT DETECTED NOT DETECTED   Staphylococcus species DETECTED (A) NOT DETECTED   Staphylococcus aureus (BCID) NOT DETECTED NOT DETECTED   Staphylococcus epidermidis DETECTED (A) NOT DETECTED   Staphylococcus lugdunensis NOT DETECTED NOT DETECTED   Streptococcus species NOT DETECTED NOT DETECTED   Streptococcus agalactiae NOT DETECTED NOT DETECTED   Streptococcus pneumoniae NOT DETECTED NOT DETECTED   Streptococcus pyogenes NOT DETECTED NOT DETECTED   A.calcoaceticus-baumannii NOT DETECTED NOT DETECTED   Bacteroides fragilis NOT DETECTED NOT DETECTED   Enterobacterales NOT DETECTED NOT DETECTED   Enterobacter cloacae complex NOT DETECTED NOT DETECTED   Escherichia coli NOT DETECTED NOT DETECTED   Klebsiella aerogenes NOT DETECTED NOT DETECTED   Klebsiella oxytoca NOT DETECTED NOT DETECTED   Klebsiella pneumoniae NOT DETECTED NOT DETECTED   Proteus species NOT DETECTED NOT DETECTED   Salmonella species NOT DETECTED NOT DETECTED   Serratia marcescens NOT DETECTED NOT DETECTED   Haemophilus influenzae NOT DETECTED NOT DETECTED   Neisseria meningitidis NOT DETECTED NOT DETECTED   Pseudomonas aeruginosa NOT DETECTED NOT DETECTED   Stenotrophomonas maltophilia NOT DETECTED NOT DETECTED   Candida albicans NOT DETECTED NOT  DETECTED   Candida auris NOT DETECTED NOT DETECTED   Candida glabrata NOT DETECTED NOT DETECTED   Candida krusei NOT DETECTED NOT DETECTED   Candida parapsilosis NOT DETECTED NOT DETECTED   Candida tropicalis NOT DETECTED NOT DETECTED   Cryptococcus neoformans/gattii NOT DETECTED NOT DETECTED   Methicillin resistance mecA/C NOT DETECTED NOT DETECTED    Leota Sauers Pharm.D. CPP, BCPS Clinical Pharmacist [phone] 12/24/2020 5:43 PM

## 2020-12-24 NOTE — Progress Notes (Signed)
Patient admitted to 5w16. A&Ox2. Disoriented to place and situation. VS stable. No complaints of pain.  R hip wound with eschar and a large serous-filled blister immediately next to it. Have placed a foam dressing over the area for protection. Blanchable redness on sacrum, foam applied. Bilateral bottoms of feet are red and warm to the touch, pt does not report any feelings of burning or tingling on feet. Will continue to assess for changes in feeling and appearance.   Have attempted to call pt's roommate, Viviann Spare. I have left a VM with my callback number. I would like to get more information about his baseline at home, due to his limited mobility here on admission and his altered mentation.

## 2020-12-24 NOTE — TOC Initial Note (Signed)
Transition of Care [facility]) - Initial/Assessment Note    Patient Details  Name: [name] MRN: [id number] Date of Birth: 12-29-1955  Transition of Care [facility]) CM/SW Contact:    Lockie Pares, RN Phone Number: 12/24/2020, 1:23 PM  Clinical Narrative:                   65 year old patient in with infected foor ulcer. , lives with roommate.Roommate stated he has been in his room for three days and was refusing all help, even after urinating and stooling on himself. Was confused on arrival to ED. , transferred to [facility] due to need for MRI and admittance with IV Abx. PT and OT evaluation and consult ordered for recommendations. Likely will need SNF placement for rehab if agreeable. Consult with CSW for Potential APS report.   Expected Discharge Plan: Skilled Nursing Facility Barriers to Discharge: Continued Medical Work up   Patient Goals and CMS Choice        Expected Discharge Plan and Services Expected Discharge Plan: Skilled Nursing Facility   Discharge Planning Services: CM Consult                                          Prior Living Arrangements/Services   Lives with:: Roommate Patient language and need for interpreter reviewed:: Yes        Need for Family Participation in Patient Care: Yes (Comment) Care giver support system in place?: Yes (comment)   Criminal Activity/Legal Involvement Pertinent to Current Situation/Hospitalization: No - Comment as needed  Activities of Daily Living      Permission Sought/Granted                  Emotional Assessment       Orientation: : Fluctuating Orientation (Suspected and/or reported Sundowners) Alcohol / Substance Use: Not Applicable Psych Involvement: No (comment)  Admission diagnosis:  Acute encephalopathy [G93.40] Patient Active Problem List   Diagnosis Date Noted   Decubitus ulcer of hip 12/24/2020   Rhabdomyolysis 12/24/2020   Polysubstance abuse (HCC) 08/12/2019   Substance induced mood  disorder (HCC) 08/12/2019   Vitamin D deficiency 09/16/2018   Altered mental status    Acute encephalopathy    Goals of care, counseling/discussion    Palliative care encounter    GI bleed 10/18/2017   Symptomatic anemia    Upper GI bleeding 10/16/2017   Coccygeal pain, acute 04/08/2017   Elevated TSH 04/06/2016   Bipolar I disorder (HCC) 04/05/2016   Lateral femoral cutaneous neuropathy, 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   Numbness 01/16/2016   Alcohol abuse 07/16/2015   Other fatigue 06/29/2014   Gait disturbance 06/29/2014   Urinary urgency 06/29/2014   High risk medication use 06/29/2014   MS (multiple sclerosis) (HCC) 10/31/2011    Class: Chronic   Dysthymia 05/11/2011   PCP:  Rinaldo Cloud, MD Pharmacy:   [facility] [phone] - [place], [place] - [phone] AT [facility] [address] [place]  [address] Phone: [phone] Fax: [phone]  [facility] (Now [facility]) - [place], [facility] - [address] [place] [address] Phone: [phone] Fax: [phone]  [facility] - [place], [place] - [address] [place] [address] Phone: [phone] Fax: [phone]     Social Determinants of Health (SDOH) Interventions  Readmission Risk Interventions No flowsheet data found.   

## 2020-12-24 NOTE — Progress Notes (Signed)
Wound care performed following wound care orders.  LDA created with wound measurements. Prevalon boots placed. Patient placed on air redistribution mattress.   Patient is calm in the room, and is slightly agitated when being turned in the bed by staff. He is having slight visual and auditory hallucinations but is otherwise calm and at rest.

## 2020-12-24 NOTE — Evaluation (Signed)
Physical Therapy Evaluation Patient Details Name: [name] MRN: [id number] DOB: 08-19-1955 Today's Date: 12/24/2020   History of Present Illness  Pt is a 65 y.o. male who presented 12/23/20 with generalized weakness and AMS. Per chart, pt's roommate called EMS after pt has been laying in bed for past 3 days, covered in feces and urine with worsening contractures. Pending work-up. MRI showed small remote cerebellar infarcts, but no acute or subacute insult. PMH: MS, EtOH abuse, depression, BPD, prior GIB from gastric ulcer and esophagitis in 2019   Clinical Impression  Pt presents with condition above and deficits mentioned below, see PT Problem List. Pt was confused and possibly hallucinating, thus was a poor historian. Attempted to call pt's primary contact on file, but call went directly to voicemail. Per chart review, pt appeared to recently be ambulating and was living with a roommate ina disheveled/poor environment. As of last year, he was using a SPC for mod I mobility. Currently, pt displays limitations in bil shoulder ROM, bil hip abduction and extension ROM, bil knee extension ROM, skin integrity, cognition, balance, and activity tolerance. Pt is at high risk for falls. Pt required max-TA for bed mobility this date. Deferred attempt at standing due to severe contractures, cognitive impairments, and lack of +2 today. Pt would benefit from follow up with short-term rehab at a SNF as he appears to have had a significant decline in function, has medical needs, and is currently able to tolerate short bouts of therapy. Will continue to follow acutely.    Follow Up Recommendations SNF;Supervision/Assistance - 24 hour    Equipment Recommendations  Other (comment);Hospital bed;Wheelchair cushion (measurements PT);Wheelchair (measurements PT);3in1 (PT) (mechanical lift; pending progression)    Recommendations for Other Services       Precautions / Restrictions Precautions Precautions:  Fall Precaution Comments: bil leg contractures; pressure ulcer R lateral hip; high risk for skin breakdown Restrictions Weight Bearing Restrictions: No      Mobility  Bed Mobility Overal bed mobility: Needs Assistance Bed Mobility: Rolling;Sidelying to Sit;Sit to Sidelying Rolling: Total assist Sidelying to sit: Total assist;HOB elevated     Sit to sidelying: Max assist;HOB elevated General bed mobility comments: Pt needing TA to roll to L and transition sidelying to sit EOB with HOB elevated. Pt did show some initiation in placing his L elbow on the bed and attempt to raise legs onto bed with return to lying, maxA.    Transfers                 General transfer comment: Deferred due to poor ability to follow commands and signficiant leg contractures with lack of +2 assistance  Ambulation/Gait             General Gait Details: Deferred due to poor ability to follow commands and signficiant leg contractures with lack of +2 assistance  Stairs            Wheelchair Mobility    Modified Rankin (Stroke Patients Only) Modified Rankin (Stroke Patients Only) Pre-Morbid Rankin Score: Slight disability Modified Rankin: Severe disability     Balance Overall balance assessment: Needs assistance Sitting-balance support: No upper extremity supported;Feet unsupported Sitting balance-Leahy Scale: Poor Sitting balance - Comments: Pt able to sit min guard asisst for brief periods of time but needs minA as he fatigues.       Standing balance comment: Deferred due to poor ability to follow commands and signficiant leg contractures with lack of +2 assistance  Pertinent Vitals/Pain Pain Assessment: Faces Faces Pain Scale: Hurts whole lot Pain Location: shoulders and generally with movement Pain Descriptors / Indicators: Grimacing;Guarding Pain Intervention(s): Limited activity within patient's tolerance;Monitored during  session;Repositioned    Home Living Family/patient expects to be discharged to:: Private residence Living Arrangements: Non-relatives/Friends Available Help at Discharge: Friend(s) Type of Home: Mobile home Home Access: Stairs to enter   [employer] of Steps: 4-5 at the front and back (unsure of rails or not) Home Layout: One level Home Equipment: Gilmer Mor - single point Additional Comments: Information obtained from entry on 08/14/19 as pt is confused/poor historian and contact under pt's chart did not answer phone call, went straight to voicemail. Per chart, EMS noted pt was living in [place] and poor environment.    Prior Function Level of Independence: Independent with assistive device(s)         Comments: Per entry on 08/14/19, he was mod I with SPC. Per chart review, pt appeared to be recently ambulating.     Hand Dominance        Extremity/Trunk Assessment   Upper Extremity Assessment Upper Extremity Assessment: RUE deficits/detail;LUE deficits/detail;Generalized weakness;Difficult to assess due to impaired cognition RUE Deficits / Details: Limited shoulder flexion/general ROM, up to ~90 degrees of AAROM into shoulder flexion before pt resists due to pain; wounds noted dorsal aspect of hand RUE:  (unable to fully assess due to decreased cognitive status) LUE Deficits / Details: Limited shoulder flexion/general ROM, up to ~90 degrees of AAROM into shoulder flexion before pt resists due to pain LUE:  (unable to fully assess due to decreased cognitive status)    Lower Extremity Assessment Lower Extremity Assessment: Generalized weakness;RLE deficits/detail;LLE deficits/detail;Difficult to assess due to impaired cognition RLE Deficits / Details: Rests with L leg crossed over R; displays hip adduction contracture; limited in PROM knee extension by ~30 degrees with hip flexed sitting EOB; WFL ankle PROM RLE:  (unable to fully assess due to decreased cognitive  status) LLE Deficits / Details: Rests with L leg crossed over R; displays hip adduction contracture; limited in PROM knee extension by ~45 degrees with hip flexed sitting EOB; WFL ankle PROM LLE:  (unable to fully assess due to decreased cognitive status)    Cervical / Trunk Assessment Cervical / Trunk Assessment: Kyphotic (rests rolled to R, noted pressure ulcer thus rolled pt to his L end of session to relieve pressure)  Communication   Communication: Other (comment) (soft-spoken/mumbles)  Cognition Arousal/Alertness: Lethargic Behavior During Therapy: Flat affect Overall Cognitive Status: No family/caregiver present to determine baseline cognitive functioning Area of Impairment: Attention;Memory;Following commands;Safety/judgement;Awareness;Problem solving                   Current Attention Level: Selective Memory: Decreased short-term memory Following Commands: Follows one step commands inconsistently;Follows one step commands with increased time Safety/Judgement: Decreased awareness of safety;Decreased awareness of deficits Awareness: Intellectual Problem Solving: Slow processing;Decreased initiation;Requires verbal cues;Difficulty sequencing;Requires tactile cues General Comments: Pt letahrgic, keeping eyes closed majority of session, but opening when pt was sat up EOB. Unsure if pt may have been hallucinating talking about how he was going to find a man. Tangential speech, talking about cops one moment and dancing another. Difficulty following commands, follows ~5% of simple commands with delay.      General Comments General comments (skin integrity, edema, etc.): Pressure ulcer and blister noted R lateral hip; redness noted bil feet, medial thighs, anterior inferior trunk; notified RN to communicate with next RN need to roll  pt to L to keep pt off R pressure ulcer; positioned pt end of session rolled to L with pillow between knees to reduce pressure    Exercises      Assessment/Plan    PT Assessment Patient needs continued PT services  PT Problem List Decreased strength;Decreased range of motion;Decreased activity tolerance;Decreased balance;Decreased mobility;Decreased coordination;Decreased cognition;Decreased knowledge of use of DME;Decreased safety awareness;Impaired tone;Decreased skin integrity;Pain       PT Treatment Interventions DME instruction;Gait training;Stair training;Functional mobility training;Therapeutic activities;Therapeutic exercise;Balance training;Neuromuscular re-education;Cognitive remediation;Patient/family education;Wheelchair mobility training    PT Goals (Current goals can be found in the Care Plan section)  Acute Rehab PT Goals Patient Stated Goal: did not state PT Goal Formulation: With patient Time For Goal Achievement: 01/07/21 Potential to Achieve Goals: Fair    Frequency Min 2X/week   Barriers to discharge Other (comment) per chart, EMS noted pt was living in [place] and poor environment.    Co-evaluation               AM-PAC PT "6 Clicks" Mobility  Outcome Measure Help needed turning from your back to your side while in a flat bed without using bedrails?: Total Help needed moving from lying on your back to sitting on the side of a flat bed without using bedrails?: Total Help needed moving to and from a bed to a chair (including a wheelchair)?: Total Help needed standing up from a chair using your arms (e.g., wheelchair or bedside chair)?: Total Help needed to walk in hospital room?: Total Help needed climbing 3-5 steps with a railing? : Total 6 Click Score: 6    End of Session   Activity Tolerance: Patient limited by lethargy;Patient limited by pain Patient left: in bed;with call bell/phone within reach Nurse Communication: Mobility status (need to roll pt off R side) PT Visit Diagnosis: Muscle weakness (generalized) (M62.81);Difficulty in walking, not elsewhere classified  (R26.2);Pain;Unsteadiness on feet (R26.81) Pain - Right/Left:  (bil) Pain - part of body: Arm;Leg (generally)    Time: 1761-6073 PT Time Calculation (min) (ACUTE ONLY): 22 min   Charges:   PT Evaluation $PT Eval Moderate Complexity: 1 Mod          Raymond Gurney, PT, DPT Acute Rehabilitation Services  Pager: [phone] Office: [phone]   Jewel Baize 12/24/2020, 2:07 PM

## 2020-12-24 NOTE — ED Notes (Signed)
UA and CT head, then admit

## 2020-12-24 NOTE — H&P (Addendum)
History and Physical    KRUE PETERKA XYV:[id number] DOB: 03-05-1956 DOA: 12/23/2020  PCP: Rinaldo Cloud, MD  Patient coming from: Home  I have personally briefly reviewed patient's old medical records in [facility]  Chief Complaint: AMS  HPI: JACKY DROSS is a 65 y.o. male with medical history significant of MS, EtOH abuse, BPD, prior GIB from gastric ulcer and esophagitis in 2019.  Pt presents to ED for several day h/o generalized weakness, AMS.  Reportedly pt room mate called EMS because pt has been laying in bed for past 3 days, covered in feces and urine.  However pt had been refusing assistance.  He has been unable to get up out of bed and ambulate due to worsening contractures compared to baseline.  Has had poor PO intake during that time.  Denies pain anywhere to me.  Pt is confused though.   ED Course: Per RN: covered in feces initially, non-bloody stool, no evidence of GIB.  CPK 2476 T.BILI 1.5 AST 74 alt 35. Lactate 1.8   Review of Systems: As per HPI, otherwise all review of systems negative.  Past Medical History:  Diagnosis Date   Depression    Mental disorder    Movement disorder    MS (multiple sclerosis) (HCC)    Multiple sclerosis, relapsing-remitting (HCC)    Neuromuscular disorder (HCC)    Vision abnormalities     Past Surgical History:  Procedure Laterality Date   BIOPSY  10/17/2017   Procedure: BIOPSY;  Surgeon: Kathi Der, MD;  Location: [facility];  Service: Gastroenterology;;   ESOPHAGOGASTRODUODENOSCOPY (EGD) WITH PROPOFOL N/A 10/17/2017   Procedure: ESOPHAGOGASTRODUODENOSCOPY (EGD) WITH PROPOFOL;  Surgeon: Kathi Der, MD;  Location: [facility];  Service: Gastroenterology;  Laterality: N/A;   IR ANGIOGRAM SELECTIVE EACH ADDITIONAL VESSEL  10/21/2017   IR ANGIOGRAM SELECTIVE EACH ADDITIONAL VESSEL  10/21/2017   IR ANGIOGRAM VISCERAL SELECTIVE  10/21/2017   IR EMBO ART  VEN HEMORR LYMPH EXTRAV  INC GUIDE ROADMAPPING  10/21/2017    IR US GUIDE VASC ACCESS RIGHT  10/21/2017   TONSILLECTOMY       reports that he has been smoking cigarettes. He has a 36.00 pack-year smoking history. He has never used smokeless tobacco. He reports current alcohol use. He reports current drug use. Drug: Marijuana.  No Known Allergies  Family History  Problem Relation Age of Onset   Heart disease Mother    Stroke Father      Prior to Admission medications   Medication Sig Start Date End Date Taking? Authorizing Provider  folic acid (FOLVITE) 1 MG tablet Take 1 tablet (1 mg total) by mouth daily. 09/16/18   Sater, Pearletha Furl, MD  OLANZapine (ZYPREXA) 10 MG tablet Take 1 tablet (10 mg total) by mouth at bedtime. 09/16/18   Sater, Pearletha Furl, MD  sertraline (ZOLOFT) 50 MG tablet Take 1 tablet (50 mg total) by mouth daily. 09/16/18   Sater, Pearletha Furl, MD  TECFIDERA 240 MG CPDR Take 1 capsule by mouth twice daily 12/31/19   Sater, Pearletha Furl, MD  traMADol (ULTRAM) 50 MG tablet Take 1 tablet (50 mg total) by mouth every 6 (six) hours as needed for up to 10 doses. 06/01/20   Cheryll Cockayne, MD  Vitamin D, Ergocalciferol, (DRISDOL) 1.25 MG (50000 UT) CAPS capsule Take 1 capsule (50,000 Units total) by mouth every 7 (seven) days. Patient taking differently: Take 50,000 Units by mouth every Saturday. 09/18/18   Sater, Pearletha Furl, MD  Physical Exam: Vitals:   12/24/20 0100 12/24/20 0130 12/24/20 0200 12/24/20 0230  BP:  121/80 125/80 130/70  Pulse: 97 81 88 85  Resp: (!) 29 (!) 21 (!) 22 (!) 22  Temp:      TempSrc:      SpO2: 100% 98% 96% 100%  Weight:      Height:        Constitutional: Disheveled with muscle wasting. Eyes: PERRL, lids and conjunctivae normal ENMT: Mucous membranes are moist. Posterior pharynx clear of any exudate or lesions.Normal dentition.  Neck: normal, supple, no masses, no thyromegaly Respiratory: clear to auscultation bilaterally, no wheezing, no crackles. Normal respiratory effort. No accessory muscle use.   Cardiovascular: Regular rate and rhythm, no murmurs / rubs / gallops. No extremity edema. 2+ pedal pulses. No carotid bruits.  Abdomen: no tenderness, no masses palpated. No hepatosplenomegaly. Bowel sounds positive.  Musculoskeletal: no clubbing / cyanosis. No joint deformity upper and lower extremities. Good ROM, no contractures. Normal muscle tone.  Skin: Hip ulcer Neurologic: MAE Psychiatric: Oriented to self, not really situation or location.   Labs on Admission: I have personally reviewed following labs and imaging studies  CBC: Recent Labs  Lab 12/23/20 2137  WBC 7.1  NEUTROABS 5.7  HGB 11.8*  HCT 37.5*  MCV 97.4  PLT 241   Basic Metabolic Panel: Recent Labs  Lab 12/23/20 2137  NA 138  K 5.1  CL 107  CO2 19*  GLUCOSE 93  BUN 27*  CREATININE 1.24  CALCIUM 9.0   GFR: Estimated Creatinine Clearance: 61 mL/min (by C-G formula based on SCr of 1.24 mg/dL). Liver Function Tests: Recent Labs  Lab 12/23/20 2137  AST 74*  ALT 35  ALKPHOS 68  BILITOT 1.5*  PROT 6.5  ALBUMIN 3.0*   No results for input(s): LIPASE, AMYLASE in the last 168 hours. No results for input(s): AMMONIA in the last 168 hours. Coagulation Profile: Recent Labs  Lab 12/23/20 2137  INR 1.0   Cardiac Enzymes: Recent Labs  Lab 12/23/20 2137  CKTOTAL 2,476*   BNP (last 3 results) No results for input(s): PROBNP in the last 8760 hours. HbA1C: No results for input(s): HGBA1C in the last 72 hours. CBG: No results for input(s): GLUCAP in the last 168 hours. Lipid Profile: No results for input(s): CHOL, HDL, LDLCALC, TRIG, CHOLHDL, LDLDIRECT in the last 72 hours. Thyroid Function Tests: No results for input(s): TSH, T4TOTAL, FREET4, T3FREE, THYROIDAB in the last 72 hours. Anemia Panel: No results for input(s): VITAMINB12, FOLATE, FERRITIN, TIBC, IRON, RETICCTPCT in the last 72 hours. Urine analysis:    Component Value Date/Time   COLORURINE YELLOW 12/24/2020 0042   APPEARANCEUR  CLEAR 12/24/2020 0042   LABSPEC 1.025 12/24/2020 0042   PHURINE 5.0 12/24/2020 0042   GLUCOSEU NEGATIVE 12/24/2020 0042   HGBUR NEGATIVE 12/24/2020 0042   BILIRUBINUR NEGATIVE 12/24/2020 0042   KETONESUR 20 (A) 12/24/2020 0042   PROTEINUR NEGATIVE 12/24/2020 0042   UROBILINOGEN 0.2 07/08/2013 0930   NITRITE NEGATIVE 12/24/2020 0042   LEUKOCYTESUR NEGATIVE 12/24/2020 0042    Radiological Exams on Admission: CT HEAD WO CONTRAST ( )  Result Date: 12/24/2020 CLINICAL DATA:  Mental status change, unknown cause EXAM: CT HEAD WITHOUT CONTRAST TECHNIQUE: Contiguous axial images were obtained from the base of the skull through the vertex without intravenous contrast. COMPARISON:  10/31/2017 FINDINGS: Brain: There is atrophy and chronic small vessel disease changes. No acute intracranial abnormality. Specifically, no hemorrhage, hydrocephalus, mass lesion, acute infarction, or significant intracranial injury.  Vascular: No hyperdense vessel or unexpected calcification. Skull: No acute calvarial abnormality. Sinuses/Orbits: No acute findings Other: None IMPRESSION: Atrophy, chronic microvascular disease. No acute intracranial abnormality. Electronically Signed   By: Charlett Nose M.D.   On: 12/24/2020 00:57   DG Pelvis Portable  Result Date: 12/23/2020 CLINICAL DATA:  Sepsis EXAM: PORTABLE PELVIS 1-2 VIEWS COMPARISON:  None. FINDINGS: Mild degenerative changes in the hips bilaterally with joint space narrowing and spurring. No acute bony abnormality. Specifically, no fracture, subluxation, or dislocation. IMPRESSION: No acute bony abnormality. Electronically Signed   By: Charlett Nose M.D.   On: 12/23/2020 22:00   DG Chest Portable 1 View  Result Date: 12/23/2020 CLINICAL DATA:  Sepsis EXAM: PORTABLE CHEST 1 VIEW COMPARISON:  06/01/2020 FINDINGS: Heart is normal size. Lungs clear. No confluent airspace opacities or effusions. Healing left rib fractures again noted. IMPRESSION: No acute cardiopulmonary  disease. Electronically Signed   By: Charlett Nose M.D.   On: 12/23/2020 21:59    EKG: Independently reviewed.  Assessment/Plan Principal Problem:   Acute encephalopathy Active Problems:   MS (multiple sclerosis) (HCC)   Alcohol abuse   Bipolar I disorder (HCC)   Polysubstance abuse (HCC)   Decubitus ulcer of hip    Acute encephalopathy - DDx includes: MS exacerbation, EtOH withdrawal, psychiatric causes related to BPD, other causes. Negative, or unimpressive, work up thus far: UA COVID and flu CT head CBC CMP (other than slight LFT findings) Lactic acid Ordered and pending workup still: Ammonia MRI brain w and w/o contrast BCx BAL Hemoccult, though stool non-bloody this time (in contrast to last encephalopathy admission which was due to GIB in 2019). Have also ordered repeat CBC/BMP in AM IVF: LR at 100 H/o EtOH abuse - CIWA IVF as above Will give an extra dose of 500mg  thiamine now MS - Hold tecfidera for the moment until we know whats causing his encephalopathy. BPD1 - Med rec pending Hip decubitus - Wound care consult  DVT prophylaxis: SCDs - at least until hemoccult neg. Code Status: Full Family Communication: No family inroom Disposition Plan: TBD Consults called: None Admission status: Place in 9    Luisangel Wainright M. DO [facility]  How to contact the [facility] Attending or Consulting provider 7A - 7P or covering provider during after hours 7P -7A, for this patient?  Check the care team in [facility] and look for a) attending/consulting [facility] provider listed and b) the [facility] team listed Log into [email or web address]  Am[employer] Physician Scheduling and messaging for groups and whole hospitals  On call and physician scheduling software for group practices, residents, hospitalists and other medical providers for call, clinic, rotation and shift schedules. [employer] is a hospital-wide system for scheduling doctors and paging doctors on call. Easy[employer] is for  scientific plotting and data analysis.  [email or web address]  and use [place]'s universal password to access. If you do not have the password, please contact the hospital operator.  Locate the [facility] provider you are looking for under [facility] and page to a number that you can be directly reached. If you still have difficulty reaching the provider, please page the [facility] (Director on Call) for the Hospitalists listed on amion for assistance.  12/24/2020, 3:36 AM

## 2020-12-24 NOTE — Progress Notes (Signed)
PROGRESS NOTE    [name]  LOV:[id number] DOB: 04/22/1956 DOA: 12/23/2020 PCP: Rinaldo Cloud, MD    Brief Narrative:  [name] is a 65 year old male with past medical history significant for multiple sclerosis, EtOH abuse, bipolar disorder, history of GI bleed 2/2 gastric ulcer/esophagitis, tobacco use disorder who presents to [facility] ED via EMS for confusion and being found down by his roommate for likely 3 days.  Roommate reports generalized weakness and confusion for several days.  Apparently the patient has been laying in his bed for 3 days covered in feces and urine; however patient has been refusing any type of assistance.  Per EMS, patient living in disheveled/poor environment; yes report was sent.  In the ED, temperature 9 9.9 F, HR 98, RR 22, BP 134/84, SPO2 97% on room air.  Sodium 138, potassium 5.1, chloride 107, CO2 19, glucose 93, BUN 27, creatinine 1.24, AST 74, ALT 35, total bilirubin 1.5.  CK 2476, lactic acid 1.8.  WBC 7.1, hemoglobin 11.8, platelets 241.  INR 1.0.  COVID-19 PCR negative.  Influenza A/B PCR negative.  Urinalysis negative.  UDS positive for THC.  Chest x-ray with no acute cardiopulmonary disease process.  X-ray pelvis with no acute bony abnormality.  CT head without contrast with atrophy, chronic microvascular disease without acute intracranial abnormality. EDP consulted hospitalist service for admission for further evaluation and management of acute metabolic encephalopathy with mild rhabdomyolysis, weakness, adult failure to thrive.   Assessment & Plan:   Principal Problem:   Acute encephalopathy Active Problems:   MS (multiple sclerosis) (HCC)   Alcohol abuse   Bipolar I disorder (HCC)   Polysubstance abuse (HCC)   Decubitus ulcer of hip   Rhabdomyolysis   Acute metabolic encephalopathy, POA: Improving Patient presenting from home via EMS after being found by his roommate in his bed for at least the last 3 days.  He has had increasing weakness,  confusion.  On arrival, patient is afebrile without leukocytosis.  UDS positive for THC, EtOH level less than 10.  CT head with cerebral atrophy without acute findings.  Urinalysis unremarkable.  Ammonia level within normal limits, lactic acid within normal limits.  Covid-19/influenza A/B negative.  Continues with chronic alcohol abuse with elevated LFTs.  No infectious etiology, suspect related to his chronic comorbidities, continued alcohol/tobacco abuse and refusal to take his home medications.  Mild rhabdomyolysis, POA Per roommates report, patient has been lying in his bed for several days.  Patient has a right hip wound and elevated CK level of 2476. --Continue IVF with LR at 100 mL/h --Supportive care  Right hip wound, POA Patient with right hip wound, eschar with blistering. --Wound care consulted --Continue local wound care, offloading  EtOH use disorder Patient reports that he drinks "3 packs/day".  Does not discern type of drink or actual amount.  Patient's LFTs notably elevated which are consistent with alcohol abuse. --CIWA protocol with symptom triggered Ativan --Multivitamin, folic acid, thiamine --Social work for substance abuse  Tobacco use disorder Patient reports smokes 3 packs/day.  Counseled on need for complete cessation. --Nicotine patch  Hx multiple sclerosis, relapsing remitting Follows with Neurology outpatient; Dr. Epimenio Foot. Last seen in clinic 05/05/2020. On Tecfidera since 2013; but patient reports not taking any medications currently.  MR brain with and without contrast with advanced white matter disease that has progressed from 2019 with no acute/subacute insult. --will hold restarting Tecfidera for now since he is non-compliant outpatient --Outpatient follow-up with neurology  Hx Bipolar disorder  Hx Depression with suicidal ideation Prescribe sertraline 50 mg p.o. daily, olanzapine 10 mg p.o. nightly, although reports not taking any medications.  Adult  failure to thrive Patient brought in by EMS after being found in his bed for several days by roommate.  Per EMS report, house in deplorable condition with right hip wound noted and patient disheveled in appearance covered in urine and feces. --Apparently APS report has been filed by EMS --Social work for evaluation  Weakness/debility/deconditioning: --PT/OT evaluation   DVT prophylaxis: SCDs Start: 12/24/20 0312   Code Status: Full Code Family Communication: No family present at bedside  Disposition Plan:  Level of care: Telemetry Medical Status is: Observation  The patient remains OBS appropriate and will d/c before 2 midnights.  Dispo: The patient is from: Home              Anticipated d/c is to:  To be determined              Patient currently is not medically stable to d/c.   Difficult to place patient No   Consultants:  None  Procedures:  None  Antimicrobials:  None   Subjective: Patient seen examined at bedside, resting comfortably.  Slightly confused.  States "I need to put my boots on".  No family present.  Does not know why or how he was brought to the hospital.  No other specific complaints or concerns at this time.  Denies headache, no fever, no chest pain, no palpitations, no shortness of breath, no abdominal pain.  No acute events overnight per nursing staff.  Objective: Vitals:   12/24/20 0900 12/24/20 1100 12/24/20 1130 12/24/20 1143  BP: 121/73 122/75    Pulse: 95 92 98 95  Resp: (!) 22 (!) 24 20 19   Temp:      TempSrc:      SpO2: 98% 95% 94% 93%  Weight:      Height:        Intake/Output Summary (Last 24 hours) at 12/24/2020 1321 Last data filed at 12/24/2020 02/23/2021 Gross per 24 hour  Intake 1050 ml  Output --  Net 1050 ml   Filed Weights   12/23/20 2115  Weight: 72.6 kg    Examination:  General exam: Appears calm and comfortable, chronically ill in appearance, appears older than stated age, disheveled, unkempt and malodorous Respiratory  system: Clear to auscultation. Respiratory effort normal.  On room air Cardiovascular system: S1 & S2 heard, RRR. No JVD, murmurs, rubs, gallops or clicks. No pedal edema. Gastrointestinal system: Abdomen is nondistended, soft and nontender. No organomegaly or masses felt. Normal bowel sounds heard. Central nervous system: Alert, not oriented to place/person, but oriented to time. No focal neurological deficits. Extremities: Chronic skin changes bilateral lower extremities, moves all extremities independently Skin: No rashes, lesions or ulcers Psychiatry: Judgement and insight appear poor.  Depressed mood & flat affect    Data Reviewed: I have personally reviewed following labs and imaging studies  CBC: Recent Labs  Lab 12/23/20 2137  WBC 7.1  NEUTROABS 5.7  HGB 11.8*  HCT 37.5*  MCV 97.4  PLT 241   Basic Metabolic Panel: Recent Labs  Lab 12/23/20 2137 12/24/20 0258  NA 138 138  K 5.1 4.6  CL 107 109  CO2 19* 17*  GLUCOSE 93 95  BUN 27* 27*  CREATININE 1.24 1.07  CALCIUM 9.0 9.0  MG  --  2.1  PHOS  --  3.6   GFR: Estimated Creatinine Clearance: 70.7 mL/min (by  C-G formula based on SCr of 1.07 mg/dL). Liver Function Tests: Recent Labs  Lab 12/23/20 2137 12/24/20 0258  AST 74* 62*  ALT 35 32  ALKPHOS 68 71  BILITOT 1.5* 1.5*  PROT 6.5 6.6  ALBUMIN 3.0* 3.0*   No results for input(s): LIPASE, AMYLASE in the last 168 hours. Recent Labs  Lab 12/24/20 0428  AMMONIA 23   Coagulation Profile: Recent Labs  Lab 12/23/20 2137 12/24/20 0258  INR 1.0 1.0   Cardiac Enzymes: Recent Labs  Lab 12/23/20 2137  CKTOTAL 2,476*   BNP (last 3 results) No results for input(s): PROBNP in the last 8760 hours. HbA1C: No results for input(s): HGBA1C in the last 72 hours. CBG: No results for input(s): GLUCAP in the last 168 hours. Lipid Profile: No results for input(s): CHOL, HDL, LDLCALC, TRIG, CHOLHDL, LDLDIRECT in the last 72 hours. Thyroid Function Tests: No  results for input(s): TSH, T4TOTAL, FREET4, T3FREE, THYROIDAB in the last 72 hours. Anemia Panel: No results for input(s): VITAMINB12, FOLATE, FERRITIN, TIBC, IRON, RETICCTPCT in the last 72 hours. Sepsis Labs: Recent Labs  Lab 12/23/20 2137  LATICACIDVEN 1.8    Recent Results (from the past 240 hour(s))  Resp Panel by RT-PCR (Flu A&B, Covid) Nasopharyngeal Swab     Status: None   Collection Time: 12/23/20 10:30 PM   Specimen: Nasopharyngeal Swab; Nasopharyngeal(NP) swabs in vial transport medium  Result Value Ref Range Status   SARS Coronavirus 2 by RT PCR NEGATIVE NEGATIVE Final    Comment: (NOTE) SARS-CoV-2 target nucleic acids are NOT DETECTED.  The SARS-CoV-2 RNA is generally detectable in upper respiratory specimens during the acute phase of infection. The lowest concentration of SARS-CoV-2 viral copies this assay can detect is 138 copies/mL. A negative result does not preclude SARS-Cov-2 infection and should not be used as the sole basis for treatment or other patient management decisions. A negative result may occur with  improper specimen collection/handling, submission of specimen other than nasopharyngeal swab, presence of viral mutation(s) within the areas targeted by this assay, and inadequate number of viral copies(<138 copies/mL). A negative result must be combined with clinical observations, patient history, and epidemiological information. The expected result is Negative.  Fact Sheet for Patients:  BloggerCourse.com  Fact Sheet for Healthcare Providers:  SeriousBroker.it  This test is no t yet approved or cleared by the [place] FDA and  has been authorized for detection and/or diagnosis of SARS-CoV-2 by FDA under an Emergency Use Authorization (EUA). This EUA will remain  in effect (meaning this test can be used) for the duration of the COVID-19 declaration under Section 564(b)(1) of the Act,  21 U.S.C.section 360bbb-3(b)(1), unless the authorization is terminated  or revoked sooner.       Influenza A by PCR NEGATIVE NEGATIVE Final   Influenza B by PCR NEGATIVE NEGATIVE Final    Comment: (NOTE) The Xpert Xpress SARS-CoV-2/FLU/RSV plus assay is intended as an aid in the diagnosis of influenza from Nasopharyngeal swab specimens and should not be used as a sole basis for treatment. Nasal washings and aspirates are unacceptable for Xpert Xpress SARS-CoV-2/FLU/RSV testing.  Fact Sheet for Patients: BloggerCourse.com  Fact Sheet for Healthcare Providers: SeriousBroker.it  This test is not yet approved or cleared by the [place] FDA and has been authorized for detection and/or diagnosis of SARS-CoV-2 by FDA under an Emergency Use Authorization (EUA). This EUA will remain in effect (meaning this test can be used) for the duration of the COVID-19 declaration under Section  564(b)(1) of the Act, 21 U.S.C. section 360bbb-3(b)(1), unless the authorization is terminated or revoked.  Performed at [facility], [address] [place], [place] [address]          Radiology Studies: CT HEAD WO CONTRAST ( )  Result Date: 12/24/2020 CLINICAL DATA:  Mental status change, unknown cause EXAM: CT HEAD WITHOUT CONTRAST TECHNIQUE: Contiguous axial images were obtained from the base of the skull through the vertex without intravenous contrast. COMPARISON:  10/31/2017 FINDINGS: Brain: There is atrophy and chronic small vessel disease changes. No acute intracranial abnormality. Specifically, no hemorrhage, hydrocephalus, mass lesion, acute infarction, or significant intracranial injury. Vascular: No hyperdense vessel or unexpected calcification. Skull: No acute calvarial abnormality. Sinuses/Orbits: No acute findings Other: None IMPRESSION: Atrophy, chronic microvascular disease. No acute intracranial abnormality. Electronically  Signed   By: Charlett Nose M.D.   On: 12/24/2020 00:57   MR BRAIN W WO CONTRAST  Result Date: 12/24/2020 CLINICAL DATA:  Multiple sclerosis with new event. Mental status change EXAM: MRI HEAD WITHOUT AND WITH CONTRAST TECHNIQUE: Multiplanar, multiecho pulse sequences of the brain and surrounding structures were obtained without and with intravenous contrast. CONTRAST:  7.37mL GADAVIST GADOBUTROL 1 MMOL/ML IV SOLN COMPARISON:  Head CT from earlier today.  Brain MRI from 11/02/2017 FINDINGS: Brain: Confluent FLAIR hyperintensity in the cerebral white matter extending from periventricular to juxtacortical in multiple locations, more confluent than before. Cerebral volume loss especially affecting the white matter with corpus callosum thinning. Small remote cerebellar infarcts. Scattered black holes or chronic lacune. No restricted diffusion, hemorrhage, hydrocephalus, or collection. Vascular: Preserved flow voids and vessel enhancements. Skull and upper cervical spine: Normal marrow signal Sinuses/Orbits: Negative IMPRESSION: 1. History of multiple sclerosis with advanced white matter disease that has progressed from 2019. 2. Progressive white matter atrophy. 3. Some degree of chronic small vessel ischemia is likely. Small remote cerebellar infarcts. 4. No acute or subacute insult. Electronically Signed   By: Marnee Spring M.D.   On: 12/24/2020 08:41   DG Pelvis Portable  Result Date: 12/23/2020 CLINICAL DATA:  Sepsis EXAM: PORTABLE PELVIS 1-2 VIEWS COMPARISON:  None. FINDINGS: Mild degenerative changes in the hips bilaterally with joint space narrowing and spurring. No acute bony abnormality. Specifically, no fracture, subluxation, or dislocation. IMPRESSION: No acute bony abnormality. Electronically Signed   By: Charlett Nose M.D.   On: 12/23/2020 22:00   DG Chest Portable 1 View  Result Date: 12/23/2020 CLINICAL DATA:  Sepsis EXAM: PORTABLE CHEST 1 VIEW COMPARISON:  06/01/2020 FINDINGS: Heart is normal size.  Lungs clear. No confluent airspace opacities or effusions. Healing left rib fractures again noted. IMPRESSION: No acute cardiopulmonary disease. Electronically Signed   By: Charlett Nose M.D.   On: 12/23/2020 21:59   DG Abd Portable 1V  Result Date: 12/24/2020 CLINICAL DATA:  MRI clearance (brain MRI) EXAM: PORTABLE ABDOMEN - 1 VIEW COMPARISON:  10/23/2017 FINDINGS: GDA region embolization coils which were placed in 2019. Subsequent brain MRI has been successfully obtained in 2019. Gas and stool is seen throughout the colon. Clear lung bases. IMPRESSION: Arterial embolization coils placed in 2019. No contraindication to MRI. Electronically Signed   By: Marnee Spring M.D.   On: 12/24/2020 06:02        Scheduled Meds:  folic acid  1 mg Oral Daily   multivitamin with minerals  1 tablet Oral Daily   thiamine  100 mg Oral Daily   Or   thiamine  100 mg Intravenous Daily   Continuous Infusions:  lactated ringers 100 mL/hr at 12/24/20 0551     LOS: 0 days    Time spent: 42 minutes spent on chart review, discussion with nursing staff, consultants, updating family and interview/physical exam; more than 50% of that time was spent in counseling and/or coordination of care.    Alvira PhilipsEric J [place], DO [facility] Available via Epic secure chat 7am-7pm After these hours, please refer to coverage provider listed on [email or web address] 12/24/2020, 1:21 PM

## 2020-12-24 NOTE — Consult Note (Signed)
WOC Nurse Consult Note: Reason for Consult: Unstageable pressure injury, right hip.  MASD, buttocks, perineal, scrotal, bilateral inguinal and medial thigh areas. Photo in EMR appreciated. Wound type:Pressure, irritant contact dermatitis  ICD-10 CM Codes for Irritant Dermatitis  L24A2 - Due to fecal, urinary or dual incontinence L30.4  - Erythema intertrigo. Also used for abrasion of the hand, chafing of the skin, dermatitis due to sweating and friction, friction dermatitis, friction eczema, and genital/thigh intertrigo.   Pressure Injury POA: Yes Measurement:To be obtained by Bedside RN and documented on Nursing Flow Sheet today (LxW in cm, depth is obscured) Wound bed: 100% obscured by nonviable tissue (eschar pulling away from wound periphery with defect appreciated beneath. Periwound erythema.  Large, serum filled blister noted from 2-4 o'clock. Drainage (amount, consistency, odor) small light yellow consistent with autolytically debriding nonviable tissue Periwound: As described above Dressing procedure/placement/frequency: I have provided a mattress replacement with low air loss feature, guidance for the care of the irritant contact dermatitis from prolonged exposure to urine and stool using Gerhart's Butt Cream, a 1:1:1 compounded preparation consisting of zinc oxide, hydrocortisone cream and lotrimin cream. This is to be applied three times daily for 10 days and PRN soiling. Turning and repositioning is to avoid the right turning surface due to the ulceration. Bilateral pressure redistribution heel boots and a sacral bordered foam dressing are to be placed for pressure injury prevention.   I have provided conservative topical care guidance for Nursing for the care of the right hip Unstageable pressure injury using NS moistened gauze (opened) to cover and a folded xeroform gauze to cover with intact (or when it ruptures) blister in the periwound area. The two areas are to be covered with an  ABD pad and secured with 2-inch paper tape.   I recommended to Dr. [place] via Secure Chat that a CCS consult be considered as a surgeon or PA-C may elect to open (debride) the ulceration. A consultation with a Registered Dietician for a dietary POC to address wound healing could also be considered.  Thank you for this consultation.  WOC nursing team will not follow, but will remain available to this patient, the nursing and medical teams.  Please re-consult if needed. Thanks, Ladona Mow, MSN, RN, GNP, [place]  Pager# [phone]

## 2020-12-24 NOTE — ED Notes (Signed)
Pt to CT at this time.

## 2020-12-24 NOTE — ED Notes (Signed)
Patient transported to MRI 

## 2020-12-24 NOTE — Progress Notes (Addendum)
Pharmacy Antibiotic Note  [name] is a 65 y.o. male admitted on 12/23/2020 with bacteremia.  Pharmacy has been consulted for vancomycin dosing. 9/2 BCx grew GPC in both aerobic and anaerobic bottles, awaiting BCID results. WBC 7.1, afebrile, SCr 1.07.   Plan: Loading dose of vancomycin 1500 mg IV once, followed by:  Vancomycin 1250 mg IV Q 24 hrs. Goal AUC 400-550. Expected AUC: 451.0, Cmin 9.3 SCr used: 1.07 Monitor levels after 3rd dose. Monitor renal function daily.  Height: 5\' 11"  (180.3 cm) Weight: 72.6 kg (160 lb) IBW/kg (Calculated) : 75.3  Temp (24hrs), Avg:99.2 F (37.3 C), Min:98 F (36.7 C), Max:99.9 F (37.7 C)  Recent Labs  Lab 12/23/20 2137 12/24/20 0258  WBC 7.1  --   CREATININE 1.24 1.07  LATICACIDVEN 1.8  --     Estimated Creatinine Clearance: 70.7 mL/min (by C-G formula based on SCr of 1.07 mg/dL).    No Known Allergies  Antimicrobials this admission: Vancomycin 9/3>>   Microbiology results: 9/2 UCx: in process 9/2 BCx: GPC in both bottles Awaiting BCID results  Thank you for allowing pharmacy to be a part of this patient's care.  11/2, PharmD Pharmacy Resident 12/24/2020, 3:41 PM

## 2020-12-24 NOTE — ED Notes (Signed)
Breakfast Ordered 

## 2020-12-24 NOTE — ED Notes (Signed)
Patient returned from MRI, breakfast tray here sat patient up with his tray.

## 2020-12-25 DIAGNOSIS — R64 Cachexia: Secondary | ICD-10-CM | POA: Diagnosis present

## 2020-12-25 DIAGNOSIS — Z20822 Contact with and (suspected) exposure to covid-19: Secondary | ICD-10-CM | POA: Diagnosis present

## 2020-12-25 DIAGNOSIS — R1310 Dysphagia, unspecified: Secondary | ICD-10-CM | POA: Diagnosis present

## 2020-12-25 DIAGNOSIS — Z66 Do not resuscitate: Secondary | ICD-10-CM | POA: Diagnosis not present

## 2020-12-25 DIAGNOSIS — J690 Pneumonitis due to inhalation of food and vomit: Secondary | ICD-10-CM | POA: Diagnosis present

## 2020-12-25 DIAGNOSIS — J9601 Acute respiratory failure with hypoxia: Secondary | ICD-10-CM | POA: Diagnosis present

## 2020-12-25 DIAGNOSIS — Z6820 Body mass index (BMI) 20.0-20.9, adult: Secondary | ICD-10-CM | POA: Diagnosis not present

## 2020-12-25 DIAGNOSIS — F17210 Nicotine dependence, cigarettes, uncomplicated: Secondary | ICD-10-CM | POA: Diagnosis present

## 2020-12-25 DIAGNOSIS — J449 Chronic obstructive pulmonary disease, unspecified: Secondary | ICD-10-CM | POA: Diagnosis present

## 2020-12-25 DIAGNOSIS — E43 Unspecified severe protein-calorie malnutrition: Secondary | ICD-10-CM | POA: Diagnosis present

## 2020-12-25 DIAGNOSIS — F1210 Cannabis abuse, uncomplicated: Secondary | ICD-10-CM | POA: Diagnosis present

## 2020-12-25 DIAGNOSIS — E870 Hyperosmolality and hypernatremia: Secondary | ICD-10-CM | POA: Diagnosis present

## 2020-12-25 DIAGNOSIS — F319 Bipolar disorder, unspecified: Secondary | ICD-10-CM | POA: Diagnosis present

## 2020-12-25 DIAGNOSIS — I2699 Other pulmonary embolism without acute cor pulmonale: Secondary | ICD-10-CM | POA: Diagnosis not present

## 2020-12-25 DIAGNOSIS — L89210 Pressure ulcer of right hip, unstageable: Secondary | ICD-10-CM | POA: Diagnosis present

## 2020-12-25 DIAGNOSIS — J9600 Acute respiratory failure, unspecified whether with hypoxia or hypercapnia: Secondary | ICD-10-CM | POA: Diagnosis not present

## 2020-12-25 DIAGNOSIS — R6521 Severe sepsis with septic shock: Secondary | ICD-10-CM | POA: Diagnosis present

## 2020-12-25 DIAGNOSIS — F0390 Unspecified dementia without behavioral disturbance: Secondary | ICD-10-CM | POA: Diagnosis present

## 2020-12-25 DIAGNOSIS — F1010 Alcohol abuse, uncomplicated: Secondary | ICD-10-CM | POA: Diagnosis present

## 2020-12-25 DIAGNOSIS — I2609 Other pulmonary embolism with acute cor pulmonale: Secondary | ICD-10-CM | POA: Diagnosis present

## 2020-12-25 DIAGNOSIS — G9340 Encephalopathy, unspecified: Secondary | ICD-10-CM | POA: Diagnosis not present

## 2020-12-25 DIAGNOSIS — R7989 Other specified abnormal findings of blood chemistry: Secondary | ICD-10-CM | POA: Diagnosis not present

## 2020-12-25 DIAGNOSIS — A419 Sepsis, unspecified organism: Secondary | ICD-10-CM | POA: Diagnosis present

## 2020-12-25 DIAGNOSIS — Z515 Encounter for palliative care: Secondary | ICD-10-CM | POA: Diagnosis not present

## 2020-12-25 DIAGNOSIS — G9341 Metabolic encephalopathy: Secondary | ICD-10-CM | POA: Diagnosis present

## 2020-12-25 DIAGNOSIS — E860 Dehydration: Secondary | ICD-10-CM | POA: Diagnosis present

## 2020-12-25 DIAGNOSIS — G35 Multiple sclerosis: Secondary | ICD-10-CM | POA: Diagnosis present

## 2020-12-25 DIAGNOSIS — M6282 Rhabdomyolysis: Secondary | ICD-10-CM | POA: Diagnosis present

## 2020-12-25 LAB — URINE CULTURE: Culture: NO GROWTH

## 2020-12-25 LAB — COMPREHENSIVE METABOLIC PANEL
ALT: 27 U/L (ref 0–44)
AST: 43 U/L — ABNORMAL HIGH (ref 15–41)
Albumin: 2.5 g/dL — ABNORMAL LOW (ref 3.5–5.0)
Alkaline Phosphatase: 63 U/L (ref 38–126)
Anion gap: 10 (ref 5–15)
BUN: 22 mg/dL (ref 8–23)
CO2: 20 mmol/L — ABNORMAL LOW (ref 22–32)
Calcium: 8.7 mg/dL — ABNORMAL LOW (ref 8.9–10.3)
Chloride: 110 mmol/L (ref 98–111)
Creatinine, Ser: 0.95 mg/dL (ref 0.61–1.24)
GFR, Estimated: >60 mL/min (ref >60)
Glucose, Bld: 90 mg/dL (ref 70–99)
Potassium: 4 mmol/L (ref 3.5–5.1)
Sodium: 140 mmol/L (ref 135–145)
Total Bilirubin: 1.5 mg/dL — ABNORMAL HIGH (ref 0.3–1.2)
Total Protein: 5.7 g/dL — ABNORMAL LOW (ref 6.5–8.1)

## 2020-12-25 MED ORDER — SERTRALINE HCL 50 MG PO TABS
25.0000 mg | ORAL_TABLET | Freq: Every day | ORAL | Status: DC
  Administered 2020-12-25 – 2020-12-30 (×5): 25 mg via ORAL
  Filled 2020-12-25 (×6): qty 1

## 2020-12-25 MED ORDER — OLANZAPINE 10 MG PO TABS
10.0000 mg | ORAL_TABLET | Freq: Every day | ORAL | Status: DC
  Administered 2020-12-26: 10 mg via ORAL
  Filled 2020-12-25 (×5): qty 1

## 2020-12-25 MED ORDER — COLLAGENASE 250 UNIT/GM EX OINT
TOPICAL_OINTMENT | Freq: Every day | CUTANEOUS | Status: DC
  Administered 2020-12-29: 1 via TOPICAL
  Filled 2020-12-25 (×2): qty 30

## 2020-12-25 NOTE — Progress Notes (Addendum)
PROGRESS NOTE    [name]  SFK:[id number] DOB: 01-Jul-1955 DOA: 12/23/2020 PCP: Rinaldo Cloud, MD    Brief Narrative:  [name] is a 65 year old male with past medical history significant for multiple sclerosis, EtOH abuse, bipolar disorder, history of GI bleed 2/2 gastric ulcer/esophagitis, tobacco use disorder who presents to [facility] ED via EMS for confusion and being found down by his roommate for likely 3 days.  Roommate reports generalized weakness and confusion for several days.  Apparently the patient has been laying in his bed for 3 days covered in feces and urine; however patient has been refusing any type of assistance.  Per EMS, patient living in disheveled/poor environment; yes report was sent.  In the ED, temperature 9 9.9 F, HR 98, RR 22, BP 134/84, SPO2 97% on room air.  Sodium 138, potassium 5.1, chloride 107, CO2 19, glucose 93, BUN 27, creatinine 1.24, AST 74, ALT 35, total bilirubin 1.5.  CK 2476, lactic acid 1.8.  WBC 7.1, hemoglobin 11.8, platelets 241.  INR 1.0.  COVID-19 PCR negative.  Influenza A/B PCR negative.  Urinalysis negative.  UDS positive for THC.  Chest x-ray with no acute cardiopulmonary disease process.  X-ray pelvis with no acute bony abnormality.  CT head without contrast with atrophy, chronic microvascular disease without acute intracranial abnormality. EDP consulted hospitalist service for admission for further evaluation and management of acute metabolic encephalopathy with mild rhabdomyolysis, weakness, adult failure to thrive.   Assessment & Plan:   Principal Problem:   Acute encephalopathy Active Problems:   MS (multiple sclerosis) (HCC)   Alcohol abuse   Bipolar I disorder (HCC)   Polysubstance abuse (HCC)   Decubitus ulcer of hip   Rhabdomyolysis   Acute metabolic encephalopathy, POA: Improving Patient presenting from home via EMS after being found by his roommate in his bed for at least the last 3 days.  He has had increasing weakness,  confusion.  On arrival, patient is afebrile without leukocytosis.  UDS positive for THC, EtOH level less than 10.  CT head with cerebral atrophy without acute findings.  Urinalysis unremarkable.  Ammonia level within normal limits, lactic acid within normal limits.  Covid-19/influenza A/B negative.  Continues with chronic alcohol abuse with elevated LFTs.  No infectious etiology, suspect related to his chronic comorbidities, continued alcohol/tobacco abuse and refusal to take his home medications.  Mild rhabdomyolysis, POA Per roommates report, patient has been lying in his bed for several days.  Patient has a right hip wound and elevated CK level of 2476. --Continue IVF with LR at 100 mL/h --Supportive care  Right hip wound, POA Patient with right hip wound, eschar with blistering. Evaluated by wound care.  Discussed with general surgery regarding possible need of bedside debridement, recommended Santyl and hydrotherapy. --Santyl daily --PT consulted for hydrotherapy  Staph epidermidis septicemia 2 out of 4 blood cultures on admission positive for staph epidermidis.  Suspect contaminant.  Initially started on vancomycin prior to [facility] ID results, now discontinued.  Patient is afebrile without leukocytosis. --Repeat blood cultures today --Continue to monitor clinically off of antibiotics  EtOH use disorder Patient reports that he drinks "3 packs/day".  Does not discern type of drink or actual amount.  Patient's LFTs notably elevated which are consistent with alcohol abuse. --CIWA protocol with symptom triggered Ativan --Multivitamin, folic acid, thiamine --Social work for substance abuse  Tobacco use disorder Patient reports smokes 3 packs/day.  Counseled on need for complete cessation. --Nicotine patch  Hx multiple sclerosis,  relapsing remitting Follows with Neurology outpatient; Dr. Epimenio Foot. Last seen in clinic 05/05/2020. On Tecfidera since 2013; but patient reports not taking any  medications currently.  MR brain with and without contrast with advanced white matter disease that has progressed from 2019 with no acute/subacute insult. --will hold restarting Tecfidera for now since he is non-compliant outpatient --Outpatient follow-up with neurology  Hx Bipolar disorder Hx Depression with suicidal ideation Prescribe sertraline 50 mg p.o. daily, olanzapine 10 mg p.o. nightly, although reports not taking any medications. --Restart sertraline at reduced dose 25 mg p.o. daily --Restart olanzapine 10 mg p.o. nightly  Adult failure to thrive Patient brought in by EMS after being found in his bed for several days by roommate.  Per EMS report, house in deplorable condition with right hip wound noted and patient disheveled in appearance covered in urine and feces. --Apparently APS report has been filed by EMS --Social work for evaluation  Weakness/debility/deconditioning: --PT/OT recommending SNF placement --TOC consulted for assistance with SNF placement   DVT prophylaxis: SCDs Start: 12/24/20 1610   Code Status: Full Code Family Communication: No family present at bedside  Disposition Plan:  Level of care: Telemetry Medical Status is: Observation  The patient remains OBS appropriate and will d/c before 2 midnights.  Dispo: The patient is from: Home              Anticipated d/c is to: SNF              Patient currently is medically stable to d/c.   Difficult to place patient No   Consultants:  Discussed with general surgery in 9/4  Procedures:  None  Antimicrobials:  None   Subjective: Patient seen examined at bedside, resting comfortably.  Repeat blood cultures being drawn.  States needs to leave, but continues to be confused. No other specific complaints or concerns at this time.  Denies headache, no fever, no chest pain, no palpitations, no shortness of breath, no abdominal pain.  No acute events overnight per nursing staff.  Objective: Vitals:    12/25/20 0000 12/25/20 0325 12/25/20 0800 12/25/20 1152  BP: 104/60 116/71 122/73 116/82  Pulse:  89 93 93  Resp: (!) 23 18 20 20   Temp:  98.9 F (37.2 C) 98.8 F (37.1 C) 98.3 F (36.8 C)  TempSrc:  Axillary Axillary Axillary  SpO2:  91% 93% 95%  Weight:      Height:        Intake/Output Summary (Last 24 hours) at 12/25/2020 1442 Last data filed at 12/25/2020 1250 Gross per 24 hour  Intake 2897.06 ml  Output 750 ml  Net 2147.06 ml   Filed Weights   12/23/20 2115  Weight: 72.6 kg    Examination:  General exam: Appears calm and comfortable, chronically ill in appearance, appears older than stated age, disheveled, unkempt and malodorous, confused Respiratory system: Clear to auscultation. Respiratory effort normal.  On room air Cardiovascular system: S1 & S2 heard, RRR. No JVD, murmurs, rubs, gallops or clicks. No pedal edema. Gastrointestinal system: Abdomen is nondistended, soft and nontender. No organomegaly or masses felt. Normal bowel sounds heard. Central nervous system: Alert, not oriented to place/person, but oriented to time. No focal neurological deficits. Extremities: Chronic skin changes bilateral lower extremities, moves all extremities independently Skin: Right hip wound with eschar/blister as depicted below  Psychiatry: Judgement and insight appear poor.  Depressed mood & flat affect     Data Reviewed: I have personally reviewed following labs and imaging studies  CBC: Recent Labs  Lab 12/23/20 2137  WBC 7.1  NEUTROABS 5.7  HGB 11.8*  HCT 37.5*  MCV 97.4  PLT 241   Basic Metabolic Panel: Recent Labs  Lab 12/23/20 2137 12/24/20 0258 12/25/20 0024  NA 138 138 140  K 5.1 4.6 4.0  CL 107 109 110  CO2 19* 17* 20*  GLUCOSE 93 95 90  BUN 27* 27* 22  CREATININE 1.24 1.07 0.95  CALCIUM 9.0 9.0 8.7*  MG  --  2.1  --   PHOS  --  3.6  --    GFR: Estimated Creatinine Clearance: 79.6 mL/min (by C-G formula based on SCr of 0.95 mg/dL). Liver Function  Tests: Recent Labs  Lab 12/23/20 2137 12/24/20 0258 12/25/20 0024  AST 74* 62* 43*  ALT 35 32 27  ALKPHOS 68 71 63  BILITOT 1.5* 1.5* 1.5*  PROT 6.5 6.6 5.7*  ALBUMIN 3.0* 3.0* 2.5*   No results for input(s): LIPASE, AMYLASE in the last 168 hours. Recent Labs  Lab 12/24/20 0428  AMMONIA 23   Coagulation Profile: Recent Labs  Lab 12/23/20 2137 12/24/20 0258  INR 1.0 1.0   Cardiac Enzymes: Recent Labs  Lab 12/23/20 2137  CKTOTAL 2,476*   BNP (last 3 results) No results for input(s): PROBNP in the last 8760 hours. HbA1C: No results for input(s): HGBA1C in the last 72 hours. CBG: No results for input(s): GLUCAP in the last 168 hours. Lipid Profile: No results for input(s): CHOL, HDL, LDLCALC, TRIG, CHOLHDL, LDLDIRECT in the last 72 hours. Thyroid Function Tests: No results for input(s): TSH, T4TOTAL, FREET4, T3FREE, THYROIDAB in the last 72 hours. Anemia Panel: No results for input(s): VITAMINB12, FOLATE, FERRITIN, TIBC, IRON, RETICCTPCT in the last 72 hours. Sepsis Labs: Recent Labs  Lab 12/23/20 2137  LATICACIDVEN 1.8    Recent Results (from the past 240 hour(s))  Urine Culture     Status: None   Collection Time: 12/23/20  9:38 PM   Specimen: In/Out Cath Urine  Result Value Ref Range Status   Specimen Description IN/OUT CATH URINE  Final   Special Requests NONE  Final   Culture   Final    NO GROWTH Performed at [facility], [address], [place], [place] [address]    Report Status 12/25/2020 FINAL  Final  Culture, blood (Routine x 2)     Status: Abnormal (Preliminary result)   Collection Time: 12/23/20 10:15 PM   Specimen: BLOOD  Result Value Ref Range Status   Specimen Description BLOOD LEFT ANTECUBITAL  Final   Special Requests   Final    BOTTLES DRAWN AEROBIC AND ANAEROBIC Blood Culture adequate volume   Culture  Setup Time   Final    GRAM POSITIVE COCCI IN BOTH AEROBIC AND ANAEROBIC BOTTLES CRITICAL RESULT CALLED TO, READ BACK BY AND  VERIFIED WITH: L CURRAN PHARMD @1706  12/24/20 EB    Culture (A)  Final    STAPHYLOCOCCUS HOMINIS STAPHYLOCOCCUS SIMULANS STAPHYLOCOCCUS EPIDERMIDIS THE SIGNIFICANCE OF ISOLATING THIS ORGANISM FROM A SINGLE SET OF BLOOD CULTURES WHEN MULTIPLE SETS ARE DRAWN IS UNCERTAIN. PLEASE NOTIFY THE MICROBIOLOGY DEPARTMENT WITHIN ONE WEEK IF SPECIATION AND SENSITIVITIES ARE REQUIRED. Performed at [facility], [address], [place], [place]    Report Status PENDING  Incomplete  Blood Culture ID Panel (Reflexed)     Status: Abnormal   Collection Time: 12/23/20 10:15 PM  Result Value Ref Range Status   Enterococcus faecalis NOT DETECTED NOT DETECTED Final  Enterococcus Faecium NOT DETECTED NOT DETECTED Final   Listeria monocytogenes NOT DETECTED NOT DETECTED Final   Staphylococcus species DETECTED (A) NOT DETECTED Final    Comment: CRITICAL RESULT CALLED TO, READ BACK BY AND VERIFIED WITH: L CURRAN PHARMD @1706  12/24/20 EB    Staphylococcus aureus (BCID) NOT DETECTED NOT DETECTED Final   Staphylococcus epidermidis DETECTED (A) NOT DETECTED Final    Comment: CRITICAL RESULT CALLED TO, READ BACK BY AND VERIFIED WITH: L CURRAN PHARMD @1706  12/24/20 EB    Staphylococcus lugdunensis NOT DETECTED NOT DETECTED Final   Streptococcus species NOT DETECTED NOT DETECTED Final   Streptococcus agalactiae NOT DETECTED NOT DETECTED Final   Streptococcus pneumoniae NOT DETECTED NOT DETECTED Final   Streptococcus pyogenes NOT DETECTED NOT DETECTED Final   A.calcoaceticus-baumannii NOT DETECTED NOT DETECTED Final   Bacteroides fragilis NOT DETECTED NOT DETECTED Final   Enterobacterales NOT DETECTED NOT DETECTED Final   Enterobacter cloacae complex NOT DETECTED NOT DETECTED Final   Escherichia coli NOT DETECTED NOT DETECTED Final   Klebsiella aerogenes NOT DETECTED NOT DETECTED Final   Klebsiella oxytoca NOT DETECTED NOT DETECTED Final   Klebsiella pneumoniae NOT DETECTED NOT DETECTED Final    Proteus species NOT DETECTED NOT DETECTED Final   Salmonella species NOT DETECTED NOT DETECTED Final   Serratia marcescens NOT DETECTED NOT DETECTED Final   Haemophilus influenzae NOT DETECTED NOT DETECTED Final   Neisseria meningitidis NOT DETECTED NOT DETECTED Final   Pseudomonas aeruginosa NOT DETECTED NOT DETECTED Final   Stenotrophomonas maltophilia NOT DETECTED NOT DETECTED Final   Candida albicans NOT DETECTED NOT DETECTED Final   Candida auris NOT DETECTED NOT DETECTED Final   Candida glabrata NOT DETECTED NOT DETECTED Final   Candida krusei NOT DETECTED NOT DETECTED Final   Candida parapsilosis NOT DETECTED NOT DETECTED Final   Candida tropicalis NOT DETECTED NOT DETECTED Final   Cryptococcus neoformans/gattii NOT DETECTED NOT DETECTED Final   Methicillin resistance mecA/C NOT DETECTED NOT DETECTED Final    Comment: Performed at [facility], [address] [place], [place] [address]  Culture, blood (Routine x 2)     Status: None (Preliminary result)   Collection Time: 12/23/20 10:20 PM   Specimen: BLOOD RIGHT HAND  Result Value Ref Range Status   Specimen Description BLOOD RIGHT HAND  Final   Special Requests   Final    BOTTLES DRAWN AEROBIC AND ANAEROBIC Blood Culture adequate volume   Culture   Final    NO GROWTH 1 DAY Performed at [facility], [address] [place], [place] [address]    Report Status PENDING  Incomplete  Resp Panel by RT-PCR (Flu A&B, Covid) Nasopharyngeal Swab     Status: None   Collection Time: 12/23/20 10:30 PM   Specimen: Nasopharyngeal Swab; Nasopharyngeal(NP) swabs in vial transport medium  Result Value Ref Range Status   SARS Coronavirus 2 by RT PCR NEGATIVE NEGATIVE Final    Comment: (NOTE) SARS-CoV-2 target nucleic acids are NOT DETECTED.  The SARS-CoV-2 RNA is generally detectable in upper respiratory specimens during the acute phase of infection. The lowest concentration of SARS-CoV-2 viral copies this assay can detect  is 138 copies/mL. A negative result does not preclude SARS-Cov-2 infection and should not be used as the sole basis for treatment or other patient management decisions. A negative result may occur with  improper specimen collection/handling, submission of specimen other than nasopharyngeal swab, presence of viral mutation(s) within the areas targeted by this assay, and inadequate  number of viral copies(<138 copies/mL). A negative result must be combined with clinical observations, patient history, and epidemiological information. The expected result is Negative.  Fact Sheet for Patients:  BloggerCourse.comhttps://www.fda.gov/media/152166/download  Fact Sheet for Healthcare Providers:  SeriousBroker.ithttps://www.fda.gov/media/152162/download  This test is no t yet approved or cleared by the [place] FDA and  has been authorized for detection and/or diagnosis of SARS-CoV-2 by FDA under an Emergency Use Authorization (EUA). This EUA will remain  in effect (meaning this test can be used) for the duration of the COVID-19 declaration under Section 564(b)(1) of the Act, 21 U.S.C.section 360bbb-3(b)(1), unless the authorization is terminated  or revoked sooner.       Influenza A by PCR NEGATIVE NEGATIVE Final   Influenza B by PCR NEGATIVE NEGATIVE Final    Comment: (NOTE) The Xpert Xpress SARS-CoV-2/FLU/RSV plus assay is intended as an aid in the diagnosis of influenza from Nasopharyngeal swab specimens and should not be used as a sole basis for treatment. Nasal washings and aspirates are unacceptable for Xpert Xpress SARS-CoV-2/FLU/RSV testing.  Fact Sheet for Patients: BloggerCourse.comhttps://www.fda.gov/media/152166/download  Fact Sheet for Healthcare Providers: SeriousBroker.ithttps://www.fda.gov/media/152162/download  This test is not yet approved or cleared by the [place] FDA and has been authorized for detection and/or diagnosis of SARS-CoV-2 by FDA under an Emergency Use Authorization (EUA). This EUA will remain in effect  (meaning this test can be used) for the duration of the COVID-19 declaration under Section 564(b)(1) of the Act, 21 U.S.C. section 360bbb-3(b)(1), unless the authorization is terminated or revoked.  Performed at [facility], [address] [place], [place] [address]          Radiology Studies: CT HEAD WO CONTRAST (5MM)  Result Date: 12/24/2020 CLINICAL DATA:  Mental status change, unknown cause EXAM: CT HEAD WITHOUT CONTRAST TECHNIQUE: Contiguous axial images were obtained from the base of the skull through the vertex without intravenous contrast. COMPARISON:  10/31/2017 FINDINGS: Brain: There is atrophy and chronic small vessel disease changes. No acute intracranial abnormality. Specifically, no hemorrhage, hydrocephalus, mass lesion, acute infarction, or significant intracranial injury. Vascular: No hyperdense vessel or unexpected calcification. Skull: No acute calvarial abnormality. Sinuses/Orbits: No acute findings Other: None IMPRESSION: Atrophy, chronic microvascular disease. No acute intracranial abnormality. Electronically Signed   By: Charlett NoseKevin  Dover M.D.   On: 12/24/2020 00:57   MR BRAIN W WO CONTRAST  Result Date: 12/24/2020 CLINICAL DATA:  Multiple sclerosis with new event. Mental status change EXAM: MRI HEAD WITHOUT AND WITH CONTRAST TECHNIQUE: Multiplanar, multiecho pulse sequences of the brain and surrounding structures were obtained without and with intravenous contrast. CONTRAST:  7.725mL GADAVIST GADOBUTROL 1 MMOL/ML IV SOLN COMPARISON:  Head CT from earlier today.  Brain MRI from 11/02/2017 FINDINGS: Brain: Confluent FLAIR hyperintensity in the cerebral white matter extending from periventricular to juxtacortical in multiple locations, more confluent than before. Cerebral volume loss especially affecting the white matter with corpus callosum thinning. Small remote cerebellar infarcts. Scattered black holes or chronic lacune. No restricted diffusion, hemorrhage, hydrocephalus,  or collection. Vascular: Preserved flow voids and vessel enhancements. Skull and upper cervical spine: Normal marrow signal Sinuses/Orbits: Negative IMPRESSION: 1. History of multiple sclerosis with advanced white matter disease that has progressed from 2019. 2. Progressive white matter atrophy. 3. Some degree of chronic small vessel ischemia is likely. Small remote cerebellar infarcts. 4. No acute or subacute insult. Electronically Signed   By: Marnee SpringJonathon  Watts M.D.   On: 12/24/2020 08:41   DG Pelvis Portable  Result Date: 12/23/2020 CLINICAL  DATA:  Sepsis EXAM: PORTABLE PELVIS 1-2 VIEWS COMPARISON:  None. FINDINGS: Mild degenerative changes in the hips bilaterally with joint space narrowing and spurring. No acute bony abnormality. Specifically, no fracture, subluxation, or dislocation. IMPRESSION: No acute bony abnormality. Electronically Signed   By: Charlett Nose M.D.   On: 12/23/2020 22:00   DG Chest Portable 1 View  Result Date: 12/23/2020 CLINICAL DATA:  Sepsis EXAM: PORTABLE CHEST 1 VIEW COMPARISON:  06/01/2020 FINDINGS: Heart is normal size. Lungs clear. No confluent airspace opacities or effusions. Healing left rib fractures again noted. IMPRESSION: No acute cardiopulmonary disease. Electronically Signed   By: Charlett Nose M.D.   On: 12/23/2020 21:59   DG Abd Portable 1V  Result Date: 12/24/2020 CLINICAL DATA:  MRI clearance (brain MRI) EXAM: PORTABLE ABDOMEN - 1 VIEW COMPARISON:  10/23/2017 FINDINGS: GDA region embolization coils which were placed in 2019. Subsequent brain MRI has been successfully obtained in 2019. Gas and stool is seen throughout the colon. Clear lung bases. IMPRESSION: Arterial embolization coils placed in 2019. No contraindication to MRI. Electronically Signed   By: Marnee Spring M.D.   On: 12/24/2020 06:02        Scheduled Meds:  collagenase   Topical Daily   feeding supplement  237 mL Oral BID BM   folic acid  1 mg Oral Daily   Gerhardt's butt cream   Topical TID    multivitamin with minerals  1 tablet Oral Daily   thiamine  100 mg Oral Daily   Or   thiamine  100 mg Intravenous Daily   Continuous Infusions:  lactated ringers 100 mL/hr at 12/25/20 0927     LOS: 0 days    Time spent: 38 minutes spent on chart review, discussion with nursing staff, consultants, updating family and interview/physical exam; more than 50% of that time was spent in counseling and/or coordination of care.    Alvira Philips [place], DO [facility] Available via Epic secure chat 7am-7pm After these hours, please refer to coverage provider listed on [email or web address] 12/25/2020, 2:42 PM

## 2020-12-25 NOTE — Consult Note (Signed)
WOC Nurse Consult Note: Discussed with Dr. [place] via Secure Chat.  CCS recommends collagenase (Santyl) daily and hydrotherapy.  I have amended orders for hydrotherapy daily (except Sundays) and for Nursing to perform wound care when PT is not performing hydro. It is noted that hydrotherapy should not delay disposition; any progress made toward revealing a red wound bed will be progress.  Collagenase/Santyl can continue post discharge.  Patient would benefit from continued oversight of this wound, either in a SNF, assisted living, or if discharged to the community with an [facility] or referral to an outpatient wound care center post discharge.  WOC nursing team will not follow, but will remain available to this patient, the nursing and medical teams.  Please re-consult if needed. Thanks, Ladona Mow, MSN, RN, GNP, [place]  Pager# [phone]

## 2020-12-25 NOTE — Evaluation (Signed)
Occupational Therapy Evaluation Patient Details Name: [name] MRN: [id number] DOB: September 23, 1955 Today's Date: 12/25/2020    History of Present Illness Pt is a 65 y.o. male who presented 12/23/20 with generalized weakness and AMS. Per chart, pt's roommate called EMS after pt has been laying in bed for past 3 days, covered in feces and urine with worsening contractures. Pending work-up. MRI showed small remote cerebellar infarcts, but no acute or subacute insult. PMH: MS, EtOH abuse, depression, BPD, prior GIB from gastric ulcer and esophagitis in 2019   Clinical Impression   This 65 yo male admitted with above presents to acute OT with PLOF unknown. Currently he is total A for all basic ADLs at bed level, has contractures in Bil LEs, decreased AROM/PROM Bil UEs (more proximal than distal), pain in Bil shoulders and legs with movement, and decreased cognition all affecting his ability to care for himself. He will continue to benefit from acute OT with follow up at SNF.    Follow Up Recommendations  SNF;Supervision/Assistance - 24 hour    Equipment Recommendations  Other (comment) (TBD next venue)       Precautions / Restrictions Precautions Precautions: Fall Precaution Comments: bil leg contractures; pressure ulcer R lateral hip; high risk for skin breakdown; decreased A/PROM Bil UEs Restrictions Weight Bearing Restrictions: No      Mobility Bed Mobility Overal bed mobility: Needs Assistance             General bed mobility comments: total A for all bed mobility                                                               ADL either performed or assessed with clinical judgement   ADL                                         General ADL Comments: total A. Pt did attempt to use LUE to wash face--but unable to actively move LUE enough to reach face and with attempt at hand over hand A he resisted                          Pertinent Vitals/Pain Pain Assessment: Faces Faces Pain Scale: Hurts whole lot Pain Location: shoulders and BIL LEs with movement Pain Descriptors / Indicators: Grimacing;Guarding;Moaning Pain Intervention(s): Limited activity within patient's tolerance;Monitored during session;Premedicated before session     Hand Dominance Left   Extremity/Trunk Assessment Upper Extremity Assessment Upper Extremity Assessment: RUE deficits/detail;LUE deficits/detail RUE Deficits / Details: Limited shoulder flexion/general ROM, up to ~90 degrees of AAROM into shoulder flexion before pt resists/complains of painl; can open and close hands when asked, can bend and straighten elbows with min A when asked RUE Coordination: decreased fine motor;decreased gross motor LUE Deficits / Details: Limited shoulder flexion/general ROM, up to ~90 degrees of AAROM into shoulder flexion before pt resists/complains of painl; can open and close hands when asked, can bend and straighten elbows with min A when asked LUE Coordination: decreased fine motor;decreased gross motor           Communication Communication Communication: Other (comment) (soft spoken, mumbles)   Cognition  Arousal/Alertness: Awake/alert Behavior During Therapy: Flat affect Overall Cognitive Status: No family/caregiver present to determine baseline cognitive functioning Area of Impairment: Orientation;Attention;Following commands;Awareness;Problem solving                 Orientation Level: Disoriented to;Time;Situation (2025, May, "I dont' know" when asked why he was here) Current Attention Level: Sustained   Following Commands: Follows one step commands inconsistently;Follows one step commands with increased time   Awareness: Intellectual Problem Solving: Slow processing;Decreased initiation;Requires verbal cues;Difficulty sequencing;Requires tactile cues General Comments: Pt was awake today, just finished breakfast (total A by staff).  Told me he didnt want any more to this water and tried to hand "it" to me--however there was nothing in his hand              Home Living Family/patient expects to be discharged to:: Skilled nursing facility Living Arrangements: Non-relatives/Friends Available Help at Discharge: Friend(s) Type of Home: Mobile home Home Access: Stairs to enter [employer] of Steps: 4-5 at the front and back (unsure of rails or not)   Home Layout: One level     Bathroom Shower/Tub: Tub/shower unit         Home Equipment: [employer] - single point   Additional Comments: Information obtained from entry on 08/14/19 as pt is confused/poor historian. Per chart, EMS noted pt was living in [place] and poor environment.      Prior Functioning/Environment Level of Independence: Independent with assistive device(s)        Comments: Per entry on 08/14/19, he was mod I with SPC. Per chart review, pt appeared to be recently ambulating.        OT Problem List: Decreased strength;Decreased range of motion;Impaired balance (sitting and/or standing);Pain;Decreased cognition;Impaired UE functional use;Impaired tone      OT Treatment/Interventions: Self-care/ADL training;DME and/or AE instruction;Patient/family education;Balance training    OT Goals(Current goals can be found in the care plan section) Acute Rehab OT Goals OT Goal Formulation: Patient unable to participate in goal setting Time For Goal Achievement: 01/08/21 Potential to Achieve Goals: Fair  OT Frequency: Min 2X/week   Barriers to D/C: Decreased caregiver support             AM-PAC OT "6 Clicks" Daily Activity     Outcome Measure Help from another person eating meals?: Total Help from another person taking care of personal grooming?: Total Help from another person toileting, which includes using toliet, bedpan, or urinal?: Total Help from another person bathing (including washing, rinsing, drying)?: Total Help from another  person to put on and taking off regular upper body clothing?: Total Help from another person to put on and taking off regular lower body clothing?: Total 6 Click Score: 6   End of Session    Activity Tolerance: Patient tolerated treatment well Patient left: in bed;with call bell/phone within reach  OT Visit Diagnosis: Other abnormalities of gait and mobility (R26.89);Muscle weakness (generalized) (M62.81);Other symptoms and signs involving cognitive function;Pain Pain - part of body:  (bil shoulders and legs)                Time: 6962-9528 OT Time Calculation (min): 14 min Charges:  OT General Charges $OT Visit: 1 Visit OT Evaluation $OT Eval Moderate Complexity: 1 Mod  Ignacia Palma, OTR/L Acute [employer] Pager [phone] Office [phone]    Evette Georges 12/25/2020, 10:44 AM

## 2020-12-26 DIAGNOSIS — G9340 Encephalopathy, unspecified: Secondary | ICD-10-CM | POA: Diagnosis not present

## 2020-12-26 DIAGNOSIS — E43 Unspecified severe protein-calorie malnutrition: Secondary | ICD-10-CM | POA: Insufficient documentation

## 2020-12-26 LAB — CULTURE, BLOOD (ROUTINE X 2): Special Requests: ADEQUATE

## 2020-12-26 MED ORDER — ENSURE ENLIVE PO LIQD
237.0000 mL | Freq: Three times a day (TID) | ORAL | Status: DC
  Administered 2020-12-26 – 2020-12-31 (×7): 237 mL via ORAL

## 2020-12-26 NOTE — Progress Notes (Signed)
PROGRESS NOTE    [name]  PJA:[id number] DOB: 1955-11-16 DOA: 12/23/2020 PCP: Rinaldo Cloud, MD    Brief Narrative:  [name] is a 65 year old male with past medical history significant for multiple sclerosis, EtOH abuse, bipolar disorder, history of GI bleed 2/2 gastric ulcer/esophagitis, tobacco use disorder who presents to [facility] ED via EMS for confusion and being found down by his roommate for likely 3 days.  Roommate reports generalized weakness and confusion for several days.  Apparently the patient has been laying in his bed for 3 days covered in feces and urine; however patient has been refusing any type of assistance.  Per EMS, patient living in disheveled/poor environment; yes report was sent.  In the ED, temperature 9 9.9 F, HR 98, RR 22, BP 134/84, SPO2 97% on room air.  Sodium 138, potassium 5.1, chloride 107, CO2 19, glucose 93, BUN 27, creatinine 1.24, AST 74, ALT 35, total bilirubin 1.5.  CK 2476, lactic acid 1.8.  WBC 7.1, hemoglobin 11.8, platelets 241.  INR 1.0.  COVID-19 PCR negative.  Influenza A/B PCR negative.  Urinalysis negative.  UDS positive for THC.  Chest x-ray with [name] acute cardiopulmonary disease process.  X-ray pelvis with [name] acute bony abnormality.  CT head without contrast with atrophy, chronic microvascular disease without acute intracranial abnormality. EDP consulted hospitalist service for admission for further evaluation and management of acute metabolic encephalopathy with mild rhabdomyolysis, weakness, adult failure to thrive.   Assessment & Plan:   Principal Problem:   Acute encephalopathy Active Problems:   MS (multiple sclerosis) (HCC)   Alcohol abuse   Bipolar I disorder (HCC)   Polysubstance abuse (HCC)   Decubitus ulcer of hip   Rhabdomyolysis   Acute metabolic encephalopathy, POA: Improving Patient presenting from home via EMS after being found by his roommate in his bed for at least the last 3 days.  He has had increasing weakness,  confusion.  On arrival, patient is afebrile without leukocytosis.  UDS positive for THC, EtOH level less than 10.  CT head with cerebral atrophy without acute findings.  Urinalysis unremarkable.  Ammonia level within normal limits, lactic acid within normal limits.  Covid-19/influenza A/B negative.  Continues with chronic alcohol abuse with elevated LFTs.  [name] infectious etiology, suspect related to his chronic comorbidities, continued alcohol/tobacco abuse and refusal to take his home medications.  Mild rhabdomyolysis, POA Per roommates report, patient has been lying in his bed for several days.  Patient has a right hip wound and elevated CK level of 2476. --Continue IVF with LR at 100 mL/h --Supportive care  Right hip wound, POA Patient with right hip wound, eschar with blistering. Evaluated by wound care.  Discussed with general surgery regarding possible need of bedside debridement, recommended Santyl and hydrotherapy. --Santyl daily --PT consulted for hydrotherapy  Staph epidermidis septicemia 2 out of 4 blood cultures on admission positive for staph epidermidis.  Suspect contaminant.  Initially started on vancomycin prior to [facility] ID results, now discontinued.  Patient is afebrile without leukocytosis. --Repeat blood cultures 9/4: Growth less than 24 hours --Continue to monitor clinically off of antibiotics  EtOH use disorder Patient reports that he drinks "3 packs/day".  Does not discern type of drink or actual amount.  Patient's LFTs notably elevated which are consistent with alcohol abuse. --CIWA protocol with symptom triggered Ativan --Multivitamin, folic acid, thiamine --Social work for substance abuse  Tobacco use disorder Patient reports smokes 3 packs/day.  Counseled on need for complete cessation. --Nicotine  patch  Hx multiple sclerosis, relapsing remitting Follows with Neurology outpatient; Dr. Epimenio Foot. Last seen in clinic 05/05/2020. On Tecfidera since 2013; but patient reports  not taking any medications currently.  MR brain with and without contrast with advanced white matter disease that has progressed from 2019 with [name] acute/subacute insult. --will hold restarting Tecfidera for now since he is non-compliant outpatient --Outpatient follow-up with neurology  Hx Bipolar disorder Hx Depression with suicidal ideation Prescribe sertraline 50 mg p.o. daily, olanzapine 10 mg p.o. nightly, although reports not taking any medications. --Restart sertraline at reduced dose 25 mg p.o. daily --Restart olanzapine 10 mg p.o. nightly  Adult failure to thrive Patient brought in by EMS after being found in his bed for several days by roommate.  Per EMS report, house in deplorable condition with right hip wound noted and patient disheveled in appearance covered in urine and feces. --Adult protective services currently seeking court appointed guardianship. --Social work for evaluation  Weakness/debility/deconditioning: --PT/OT recommending SNF placement --TOC consulted for assistance with SNF placement   DVT prophylaxis: SCDs Start: 12/24/20 6606   Code Status: Full Code Family Communication: [name] family present at bedside  Disposition Plan:  Level of care: Telemetry Medical Status is: Inpatient  Remains inpatient appropriate because:Unsafe d/c plan, IV treatments appropriate due to intensity of illness or inability to take PO, Inpatient level of care appropriate due to severity of illness, and APS currently seeking court appointed guardianship  Dispo: The patient is from: Home              Anticipated d/c is to: SNF              Patient currently is not medically stable to d/c.   Difficult to place patient [name]  Consultants:  Discussed with general surgery in 9/4  Procedures:  None  Antimicrobials:  None   Subjective: Patient seen examined at bedside, resting comfortably.  Wants to know when he can return home.  Discussed with patient that he is extremely weak and  debilitated and needs further rehabilitation at SNF.  Seems to have some understanding.  [name] other specific complaints or concerns at this time.  Denies headache, [name] fever, [name] chest pain, [name] palpitations, [name] shortness of breath, [name] abdominal pain.  [name] acute events overnight per nursing staff.  Objective: Vitals:   12/26/20 0000 12/26/20 0401 12/26/20 0819 12/26/20 1200  BP:  123/65 124/90 (P) 106/66  Pulse: 83 84 90 (P) 95  Resp: 20  20 (P) 19  Temp: 98 F (36.7 C) 98 F (36.7 C) 98.2 F (36.8 C)   TempSrc: Axillary Axillary Axillary   SpO2: 98% 97% 97%   Weight:      Height:        Intake/Output Summary (Last 24 hours) at 12/26/2020 1219 Last data filed at 12/26/2020 0745 Gross per 24 hour  Intake 1460 ml  Output 450 ml  Net 1010 ml   Filed Weights   12/23/20 2115  Weight: 72.6 kg    Examination:  General exam: Appears calm and comfortable, chronically ill in appearance, appears older than stated age, disheveled, unkempt and malodorous Respiratory system: Clear to auscultation. Respiratory effort normal.  On room air Cardiovascular system: S1 & S2 heard, RRR. [name] JVD, murmurs, rubs, gallops or clicks. [name] pedal edema. Gastrointestinal system: Abdomen is nondistended, soft and nontender. [name] organomegaly or masses felt. Normal bowel sounds heard. Central nervous system: Alert, oriented to person/place/time. [name] focal neurological deficits. Extremities: Chronic skin changes bilateral lower  extremities, moves all extremities independently Skin: Right hip wound with eschar/blister as depicted below  Psychiatry: Judgement and insight appear poor.  Depressed mood & flat affect     Data Reviewed: I have personally reviewed following labs and imaging studies  CBC: Recent Labs  Lab 12/23/20 2137  WBC 7.1  NEUTROABS 5.7  HGB 11.8*  HCT 37.5*  MCV 97.4  PLT 241   Basic Metabolic Panel: Recent Labs  Lab 12/23/20 2137 12/24/20 0258 12/25/20 0024  NA 138 138 140  K 5.1 4.6  4.0  CL 107 109 110  CO2 19* 17* 20*  GLUCOSE 93 95 90  BUN 27* 27* 22  CREATININE 1.24 1.07 0.95  CALCIUM 9.0 9.0 8.7*  MG  --  2.1  --   PHOS  --  3.6  --    GFR: Estimated Creatinine Clearance: 79.6 mL/min (by C-G formula based on SCr of 0.95 mg/dL). Liver Function Tests: Recent Labs  Lab 12/23/20 2137 12/24/20 0258 12/25/20 0024  AST 74* 62* 43*  ALT 35 32 27  ALKPHOS 68 71 63  BILITOT 1.5* 1.5* 1.5*  PROT 6.5 6.6 5.7*  ALBUMIN 3.0* 3.0* 2.5*   [name] results for input(s): LIPASE, AMYLASE in the last 168 hours. Recent Labs  Lab 12/24/20 0428  AMMONIA 23   Coagulation Profile: Recent Labs  Lab 12/23/20 2137 12/24/20 0258  INR 1.0 1.0   Cardiac Enzymes: Recent Labs  Lab 12/23/20 2137  CKTOTAL 2,476*   BNP (last 3 results) [name] results for input(s): PROBNP in the last 8760 hours. HbA1C: [name] results for input(s): HGBA1C in the last 72 hours. CBG: [name] results for input(s): GLUCAP in the last 168 hours. Lipid Profile: [name] results for input(s): CHOL, HDL, LDLCALC, TRIG, CHOLHDL, LDLDIRECT in the last 72 hours. Thyroid Function Tests: [name] results for input(s): TSH, T4TOTAL, FREET4, T3FREE, THYROIDAB in the last 72 hours. Anemia Panel: [name] results for input(s): VITAMINB12, FOLATE, FERRITIN, TIBC, IRON, RETICCTPCT in the last 72 hours. Sepsis Labs: Recent Labs  Lab 12/23/20 2137  LATICACIDVEN 1.8    Recent Results (from the past 240 hour(s))  Urine Culture     Status: None   Collection Time: 12/23/20  9:38 PM   Specimen: In/Out Cath Urine  Result Value Ref Range Status   Specimen Description IN/OUT CATH URINE  Final   Special Requests NONE  Final   Culture   Final    [name] GROWTH Performed at [facility], [address] [place], [place] [address]    Report Status 12/25/2020 FINAL  Final  Culture, blood (Routine x 2)     Status: Abnormal   Collection Time: 12/23/20 10:15 PM   Specimen: BLOOD  Result Value Ref Range Status   Specimen Description BLOOD  LEFT ANTECUBITAL  Final   Special Requests   Final    BOTTLES DRAWN AEROBIC AND ANAEROBIC Blood Culture adequate volume   Culture  Setup Time   Final    GRAM POSITIVE COCCI IN BOTH AEROBIC AND ANAEROBIC BOTTLES CRITICAL RESULT CALLED TO, READ BACK BY AND VERIFIED WITH: L CURRAN PHARMD @1706  12/24/20 EB    Culture (A)  Final    STAPHYLOCOCCUS HOMINIS STAPHYLOCOCCUS SIMULANS STAPHYLOCOCCUS EPIDERMIDIS THE SIGNIFICANCE OF ISOLATING THIS ORGANISM FROM A SINGLE SET OF BLOOD CULTURES WHEN MULTIPLE SETS ARE DRAWN IS UNCERTAIN. PLEASE NOTIFY THE MICROBIOLOGY DEPARTMENT WITHIN ONE WEEK IF SPECIATION AND SENSITIVITIES ARE REQUIRED. Performed at [facility], [address] [place], [place]  Report Status 12/26/2020 FINAL  Final  Blood Culture ID Panel (Reflexed)     Status: Abnormal   Collection Time: 12/23/20 10:15 PM  Result Value Ref Range Status   Enterococcus faecalis NOT DETECTED NOT DETECTED Final   Enterococcus Faecium NOT DETECTED NOT DETECTED Final   Listeria monocytogenes NOT DETECTED NOT DETECTED Final   Staphylococcus species DETECTED (A) NOT DETECTED Final    Comment: CRITICAL RESULT CALLED TO, READ BACK BY AND VERIFIED WITH: L CURRAN PHARMD @1706  12/24/20 EB    Staphylococcus aureus (BCID) NOT DETECTED NOT DETECTED Final   Staphylococcus epidermidis DETECTED (A) NOT DETECTED Final    Comment: CRITICAL RESULT CALLED TO, READ BACK BY AND VERIFIED WITH: L CURRAN PHARMD @1706  12/24/20 EB    Staphylococcus lugdunensis NOT DETECTED NOT DETECTED Final   Streptococcus species NOT DETECTED NOT DETECTED Final   Streptococcus agalactiae NOT DETECTED NOT DETECTED Final   Streptococcus pneumoniae NOT DETECTED NOT DETECTED Final   Streptococcus pyogenes NOT DETECTED NOT DETECTED Final   A.calcoaceticus-baumannii NOT DETECTED NOT DETECTED Final   Bacteroides fragilis NOT DETECTED NOT DETECTED Final   Enterobacterales NOT DETECTED NOT DETECTED Final   Enterobacter cloacae  complex NOT DETECTED NOT DETECTED Final   Escherichia coli NOT DETECTED NOT DETECTED Final   Klebsiella aerogenes NOT DETECTED NOT DETECTED Final   Klebsiella oxytoca NOT DETECTED NOT DETECTED Final   Klebsiella pneumoniae NOT DETECTED NOT DETECTED Final   Proteus species NOT DETECTED NOT DETECTED Final   Salmonella species NOT DETECTED NOT DETECTED Final   Serratia marcescens NOT DETECTED NOT DETECTED Final   Haemophilus influenzae NOT DETECTED NOT DETECTED Final   Neisseria meningitidis NOT DETECTED NOT DETECTED Final   Pseudomonas aeruginosa NOT DETECTED NOT DETECTED Final   Stenotrophomonas maltophilia NOT DETECTED NOT DETECTED Final   Candida albicans NOT DETECTED NOT DETECTED Final   Candida auris NOT DETECTED NOT DETECTED Final   Candida glabrata NOT DETECTED NOT DETECTED Final   Candida krusei NOT DETECTED NOT DETECTED Final   Candida parapsilosis NOT DETECTED NOT DETECTED Final   Candida tropicalis NOT DETECTED NOT DETECTED Final   Cryptococcus neoformans/gattii NOT DETECTED NOT DETECTED Final   Methicillin resistance mecA/C NOT DETECTED NOT DETECTED Final    Comment: Performed at [facility], [address] [place], [place] [address]  Culture, blood (Routine x 2)     Status: None (Preliminary result)   Collection Time: 12/23/20 10:20 PM   Specimen: BLOOD RIGHT HAND  Result Value Ref Range Status   Specimen Description BLOOD RIGHT HAND  Final   Special Requests   Final    BOTTLES DRAWN AEROBIC AND ANAEROBIC Blood Culture adequate volume   Culture   Final    [name] GROWTH 2 DAYS Performed at [facility], [address] [place], [place] [address]    Report Status PENDING  Incomplete  Resp Panel by RT-PCR (Flu A&B, Covid) Nasopharyngeal Swab     Status: None   Collection Time: 12/23/20 10:30 PM   Specimen: Nasopharyngeal Swab; Nasopharyngeal(NP) swabs in vial transport medium  Result Value Ref Range Status   SARS Coronavirus 2 by RT PCR NEGATIVE NEGATIVE Final     Comment: (NOTE) SARS-CoV-2 target nucleic acids are NOT DETECTED.  The SARS-CoV-2 RNA is generally detectable in upper respiratory specimens during the acute phase of infection. The lowest concentration of SARS-CoV-2 viral copies this assay can detect is 138 copies/mL. A negative result does not preclude SARS-Cov-2 infection and should not be  used as the sole basis for treatment or other patient management decisions. A negative result may occur with  improper specimen collection/handling, submission of specimen other than nasopharyngeal swab, presence of viral mutation(s) within the areas targeted by this assay, and inadequate number of viral copies(<138 copies/mL). A negative result must be combined with clinical observations, patient history, and epidemiological information. The expected result is Negative.  Fact Sheet for Patients:  BloggerCourse.com  Fact Sheet for Healthcare Providers:  SeriousBroker.it  This test is [name] t yet approved or cleared by the [place] FDA and  has been authorized for detection and/or diagnosis of SARS-CoV-2 by FDA under an Emergency Use Authorization (EUA). This EUA will remain  in effect (meaning this test can be used) for the duration of the COVID-19 declaration under Section 564(b)(1) of the Act, 21 U.S.C.section 360bbb-3(b)(1), unless the authorization is terminated  or revoked sooner.       Influenza A by PCR NEGATIVE NEGATIVE Final   Influenza B by PCR NEGATIVE NEGATIVE Final    Comment: (NOTE) The Xpert Xpress SARS-CoV-2/FLU/RSV plus assay is intended as an aid in the diagnosis of influenza from Nasopharyngeal swab specimens and should not be used as a sole basis for treatment. Nasal washings and aspirates are unacceptable for Xpert Xpress SARS-CoV-2/FLU/RSV testing.  Fact Sheet for Patients: BloggerCourse.com  Fact Sheet for Healthcare  Providers: SeriousBroker.it  This test is not yet approved or cleared by the [place] FDA and has been authorized for detection and/or diagnosis of SARS-CoV-2 by FDA under an Emergency Use Authorization (EUA). This EUA will remain in effect (meaning this test can be used) for the duration of the COVID-19 declaration under Section 564(b)(1) of the Act, 21 U.S.C. section 360bbb-3(b)(1), unless the authorization is terminated or revoked.  Performed at [facility], [address] [place], [place] [address]   Culture, blood (routine x 2)     Status: None (Preliminary result)   Collection Time: 12/25/20  8:06 AM   Specimen: BLOOD  Result Value Ref Range Status   Specimen Description BLOOD BLOOD RIGHT HAND  Final   Special Requests   Final    BOTTLES DRAWN AEROBIC AND ANAEROBIC Blood Culture adequate volume   Culture   Final    [name] GROWTH < 24 HOURS Performed at [facility], [address] [place], [place] [address]    Report Status PENDING  Incomplete  Culture, blood (routine x 2)     Status: None (Preliminary result)   Collection Time: 12/25/20  8:07 AM   Specimen: BLOOD  Result Value Ref Range Status   Specimen Description BLOOD BLOOD RIGHT HAND  Final   Special Requests   Final    BOTTLES DRAWN AEROBIC AND ANAEROBIC Blood Culture adequate volume   Culture   Final    [name] GROWTH < 24 HOURS Performed at [facility], [address] [place], [place] [address]    Report Status PENDING  Incomplete         Radiology Studies: [name] results found.      Scheduled Meds:  collagenase   Topical Daily   feeding supplement  237 mL Oral BID BM   folic acid  1 mg Oral Daily   Gerhardt's butt cream   Topical TID   multivitamin with minerals  1 tablet Oral Daily   OLANZapine  10 mg Oral QHS   sertraline  25 mg Oral Daily   thiamine  100 mg Oral Daily  Or   thiamine  100 mg Intravenous Daily   Continuous Infusions:  lactated  ringers 100 mL/hr at 12/25/20 1559     LOS: 1 day    Time spent: 38 minutes spent on chart review, discussion with nursing staff, consultants, updating family and interview/physical exam; more than 50% of that time was spent in counseling and/or coordination of care.    Alvira Philips [place], DO [facility] Available via Epic secure chat 7am-7pm After these hours, please refer to coverage provider listed on [email or web address] 12/26/2020, 12:19 PM

## 2020-12-26 NOTE — Progress Notes (Signed)
RE: [name] Date of Birth: 01/01/2056 Date: 12/26/20  Please be advised that the above-named patient will require a short-term nursing home stay - anticipated 30 days or less for rehabilitation and strengthening. The plan is for return home.

## 2020-12-26 NOTE — Progress Notes (Signed)
Physical Therapy Wound Treatment Patient Details  Name: PHYLLIS WHITEFIELD MRN: [id number] Date of Birth: 03-12-1956  Today's Date: 12/26/2020 Time: 7867-6720 Time Calculation (min): 42 min  Subjective  Subjective Assessment Subjective: Pt reports speaking with his ex-wife this morning. Patient and Family Stated Goals: None stated Date of Onset:  (Unknown - PTA) Prior Treatments: Dressing changes  Pain Score:  Premedicated; 4/10 Faces Pain Scale    Wound Assessment  Pressure Injury 12/24/20 Hip Right Unstageable - Full thickness tissue loss in which the base of the injury is covered by slough (yellow, tan, gray, green or brown) and/or eschar (tan, brown or black) in the wound bed. Black (eschar) surrounded by yellow (Active)  Wound Image   12/26/20 1121  Dressing Type ABD;Barrier Film (skin prep);Gauze (Comment);Moist to moist;Non adherent 12/26/20 1121  Dressing Changed;Clean;Dry;Intact 12/26/20 1121  Dressing Change Frequency Daily 12/26/20 1121  State of Healing Eschar 12/26/20 0700  Site / Wound Assessment Pink;Yellow;Black 12/26/20 1121  % Wound base Red or Granulating 10% 12/26/20 1045  % Wound base Yellow/Fibrinous Exudate 15% 12/26/20 1045  % Wound base Black/Eschar 75% 12/26/20 1045  % Wound base Other/Granulation Tissue (Comment) 0% 12/26/20 1121  Peri-wound Assessment Other (Comment) 12/26/20 1121  Wound Length (cm) 8 cm 12/26/20 1045  Wound Width (cm) 4 cm 12/26/20 1045  Wound Depth (cm) 0 cm 12/26/20 1045  Wound Surface Area (cm^2) 32 cm^2 12/26/20 1045  Wound Volume (cm^3) 0 cm^3 12/26/20 1045  Tunneling (cm) 0 12/26/20 1121  Undermining (cm) 0 12/26/20 1121  Margins Unattached edges (unapproximated) 12/26/20 1121  Drainage Amount Minimal 12/26/20 1121  Drainage Description Serosanguineous 12/26/20 1121  Treatment Debridement (Selective);Hydrotherapy (Pulse lavage);Packing (Saline gauze) 12/26/20 1121      Hydrotherapy Pulsed lavage therapy - wound location: R  hip Pulsed Lavage with Suction (psi): 4 psi (to 8 2 pain) Pulsed Lavage with Suction - Normal Saline Used: 1000 mL Pulsed Lavage Tip: Tip with splash shield Selective Debridement Selective Debridement - Location: R hip Selective Debridement - Tools Used: Forceps, Scalpel Selective Debridement - Tissue Removed: Eschar    Wound Assessment and Plan  Wound Therapy - Assess/Plan/Recommendations Wound Therapy - Clinical Statement: Pt presents to hydrotherapy with an unstagable pressure injury to the R hip, with a large, fluid filled blister present medially to the wound. This patient will benefit from continued hydrotherapy for selective removal of unviable tissue, to decrease bioburden, and promote wound bed healing. Wound Therapy - Functional Problem List: Decreased tolerance for positioning OOB due to MS and wound. Factors Delaying/Impairing Wound Healing: Immobility, Multiple medical problems Hydrotherapy Plan: Debridement, Dressing change, Patient/family education, Pulsatile lavage with suction Wound Therapy - Frequency: 6X / week Wound Therapy - Follow Up Recommendations: dressing changes by RN  Wound Therapy Goals- Improve the function of patient's integumentary system by progressing the wound(s) through the phases of wound healing (inflammation - proliferation - remodeling) by: Wound Therapy Goals - Improve the function of patient's integumentary system by progressing the wound(s) through the phases of wound healing by: Decrease Necrotic Tissue to: 20% Decrease Necrotic Tissue - Progress: Goal set today Increase Granulation Tissue to: 80% Increase Granulation Tissue - Progress: Goal set today Goals/treatment plan/discharge plan were made with and agreed upon by patient/family: No, Patient unable to participate in goals/treatment/discharge plan and family unavailable Time For Goal Achievement: 7 days Wound Therapy - Potential for Goals: Good  Goals will be updated until maximal  potential achieved or discharge criteria met.  Discharge criteria: when goals achieved,  discharge from hospital, MD decision/surgical intervention, no progress towards goals, refusal/missing three consecutive treatments without notification or medical reason.  GP     Charges PT Wound Care Charges $Wound Debridement up to 20 cm: < or equal to 20 cm $ Wound Debridement each add'l 20 sqcm: 1 $PT PLS Gun and Tip: 1 Supply $PT Hydrotherapy Visit: 1 Visit       Thelma Comp 12/26/2020, 1:22 PM  Rolinda Roan, PT, DPT Acute Rehabilitation Services Pager: [phone] Office: [phone]

## 2020-12-26 NOTE — Progress Notes (Signed)
Initial Nutrition Assessment  DOCUMENTATION CODES:   Severe malnutrition in context of chronic illness  INTERVENTION:   Downgrade diet to Dysphagia 3 due to poor dentition  Ensure Enlive po TID, each supplement provides 350 kcal and 20 grams of protein  Magic cup TID with meals, each supplement provides 290 kcal and 9 grams of protein  Check Vitamin C, A, zinc, copper  Continue MVI with minerals, folic acid, and thiamine   NUTRITION DIAGNOSIS:   Severe Malnutrition related to chronic illness (MS, ETOH) as evidenced by severe fat depletion, severe muscle depletion.  GOAL:   Patient will meet greater than or equal to 90% of their needs  MONITOR:   PO intake, Supplement acceptance  REASON FOR ASSESSMENT:   Consult Assessment of nutrition requirement/status, Poor PO  ASSESSMENT:   Pt with PMH of multiple sclerosis, ETOH abuse, bipolar disorder, GI bleed 2/2 gastric ulcer/esophagitis, tobacco abuse admitted after being found down by his roommate. Pt lying in his bed for 3 days covered in feces and urine but refusing care.     Pt able to answer questions but confused and continues to ask to have the sheet removed from his legs which I did.  Pt lives with roommate.  Pt reports that he continues to drink a 12 pack of beer each day and does not really eat anything. Pt reports no appetite at home PTA. Pt states that he uses a wheelchair at home.  Per pt he weighs 160 lb which is what is recorded as his admission weight, likely a stated weight.   Adult protective services called by staff.   Meal Completion: 5-15% Pt states he has few teeth but will not open his mouth to show me.   PT following for hydrotherapy of unstagable wound to R hip.   Medications reviewed and include: folic acid, MVI with minerals, thiamine  LR @ 100 ml/hr  Labs reviewed: CBG: 90    NUTRITION - FOCUSED PHYSICAL EXAM:  Flowsheet Row Most Recent Value  Orbital Region Moderate depletion  Upper  Arm Region Severe depletion  Thoracic and Lumbar Region Severe depletion  Buccal Region Moderate depletion  Temple Region Moderate depletion  Clavicle Bone Region Severe depletion  Clavicle and Acromion Bone Region Severe depletion  Scapular Bone Region Severe depletion  Dorsal Hand Severe depletion  Patellar Region Severe depletion  Anterior Thigh Region Severe depletion  Posterior Calf Region Unable to assess  Edema (RD Assessment) None  Hair Reviewed  Eyes Reviewed  Mouth Reviewed  [dry]  Skin Reviewed  [dry, scabs]  Nails Reviewed  [unkempt, dirty]       Diet Order:   Diet Order             DIET DYS 3 Room service appropriate? Yes with Assist; Fluid consistency: Thin  Diet effective now                   EDUCATION NEEDS:   Not appropriate for education at this time  Skin:  Skin Assessment: Skin Integrity Issues: Skin Integrity Issues:: Unstageable Unstageable: R hip  Last BM:  9/2  Height:   Ht Readings from Last 1 Encounters:  12/23/20 5\' 11"  (1.803 m)    Weight:   Wt Readings from Last 1 Encounters:  12/23/20 72.6 kg    BMI:  Body mass index is 22.32 kg/m.  Estimated Nutritional Needs:   Kcal:  2000-2200  Protein:  110-125 grams  Fluid:  > 2 L/day  02/22/21., RD,  LDN, CNSC See AMiON for contact information

## 2020-12-26 NOTE — NC FL2 (Signed)
[place] MEDICAID FL2 LEVEL OF CARE SCREENING TOOL     IDENTIFICATION  Patient Name: [name] Birthdate: 1955-09-21 Sex: male Admission Date (Current Location): 12/23/2020  [facility] and [place] Number:  Producer, television/film/video and Address:  [facility] [place], [address] [place], [place] [address]      Provider Number: [id number]  Attending Physician Name and Address:  [place], Alvira Philips, DO  Relative Name and Phone Number:       Current Level of Care: Hospital Recommended Level of Care: Skilled Nursing Facility Prior Approval Number:    Date Approved/Denied:   PASRR Number: pending  Discharge Plan: SNF    Current Diagnoses: Patient Active Problem List   Diagnosis Date Noted   Decubitus ulcer of hip 12/24/2020   Rhabdomyolysis 12/24/2020   Polysubstance abuse (HCC) 08/12/2019   Substance induced mood disorder (HCC) 08/12/2019   Vitamin D deficiency 09/16/2018   Altered mental status    Acute encephalopathy    Goals of care, counseling/discussion    Palliative care encounter    GI bleed 10/18/2017   Symptomatic anemia    Upper GI bleeding 10/16/2017   Coccygeal pain, acute 04/08/2017   Elevated TSH 04/06/2016   Bipolar I disorder (HCC) 04/05/2016   Lateral femoral cutaneous neuropathy, 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   Numbness 01/16/2016   Alcohol abuse 07/16/2015   Other fatigue 06/29/2014   Gait disturbance 06/29/2014   Urinary urgency 06/29/2014   High risk medication use 06/29/2014   MS (multiple sclerosis) (HCC) 10/31/2011   Dysthymia 05/11/2011    Orientation RESPIRATION BLADDER Height & Weight     Self  Normal Incontinent, External catheter Weight: 160 lb (72.6 kg) Height:  5\' 11"  (180.3 cm)  BEHAVIORAL SYMPTOMS/MOOD NEUROLOGICAL BOWEL NUTRITION STATUS      Continent Diet (see dc summary)  AMBULATORY STATUS COMMUNICATION OF NEEDS Skin   Extensive Assist Verbally PU Stage and Appropriate Care (unstageable on hip with 2x/day santyl and guaze  dressing; blister on hip)                       Personal Care Assistance Level of Assistance  Bathing, Feeding, Dressing Bathing Assistance: Maximum assistance Feeding assistance: Limited assistance Dressing Assistance: Limited assistance     Functional Limitations Info             SPECIAL CARE FACTORS FREQUENCY  PT (By licensed PT), OT (By licensed OT)     PT Frequency: 5x/week OT Frequency: 5x/week            Contractures Contractures Info: Not present    Additional Factors Info  Code Status, Allergies, Psychotropic Code Status Info: Full Allergies Info: NKA Psychotropic Info: Zoloft         Current Medications (12/26/2020):  This is the current hospital active medication list Current Facility-Administered Medications  Medication Dose Route Frequency Provider Last Rate Last Admin   collagenase (SANTYL) ointment   Topical Daily 02/25/2021, [place], DO   Given at 12/26/20 1009   feeding supplement (ENSURE ENLIVE / ENSURE PLUS) liquid 237 mL  237 mL Oral BID BM 02/25/21, Eric J, DO   237 mL at 12/26/20 1010   folic acid (FOLVITE) tablet 1 mg  1 mg Oral Daily 02/25/21 M, DO   1 mg at 12/26/20 1009   Gerhardt's butt cream   Topical TID 02/25/21, Eric J, DO   Given at 12/26/20 1009   lactated ringers infusion   Intravenous  Continuous Hillary Bow, DO 100 mL/hr at 12/25/20 1559 New Bag at 12/25/20 1559   LORazepam (ATIVAN) tablet 1-4 mg  1-4 mg Oral Q1H PRN Hillary Bow, DO       Or   LORazepam (ATIVAN) injection 1-4 mg  1-4 mg Intravenous Q1H PRN Hillary Bow, DO       multivitamin with minerals tablet 1 tablet  1 tablet Oral Daily Lyda Perone M, DO   1 tablet at 12/26/20 1010   OLANZapine (ZYPREXA) tablet 10 mg  10 mg Oral QHS [place], Alvira Philips, DO       ondansetron [facility]) tablet 4 mg  4 mg Oral Q6H PRN Hillary Bow, DO       Or   ondansetron [facility]) injection 4 mg  4 mg Intravenous Q6H PRN Hillary Bow, DO       sertraline (ZOLOFT) tablet  25 mg  25 mg Oral Daily [place], Alvira Philips, DO   25 mg at 12/26/20 1009   thiamine tablet 100 mg  100 mg Oral Daily Lyda Perone M, DO   100 mg at 12/26/20 1009   Or   thiamine (B-1) injection 100 mg  100 mg Intravenous Daily Hillary Bow, DO         Discharge Medications: Please see discharge summary for a list of discharge medications.  Relevant Imaging Results:  Relevant Lab Results:   Additional Information SS#: [id number]  Mearl Latin, LCSW

## 2020-12-26 NOTE — TOC Progression Note (Signed)
Transition of Care [facility]) - Progression Note    Patient Details  Name: [name] MRN: [id number] Date of Birth: 1955/08/14  [facility] CM/SW Contact  Mearl Latin, LCSW Phone Number: 12/26/2020, 11:33 AM  Clinical Narrative:    CSW spoke with APS worker, [name], at patient's bedside. He reported Department of Social Services will be petitioning the court for temporary guardianship of the patient. The assigned social worker will keep CSW updated and will plan for SNF once someone is able to legally sign patient in.    Expected Discharge Plan: Skilled Nursing Facility Barriers to Discharge: Unsafe home situation, Awaiting State Approval Cherlyn Roberts), SNF Pending bed offer (awaiting guardianship by APS)  Expected Discharge Plan and Services Expected Discharge Plan: Skilled Nursing Facility In-house Referral: Clinical Social Work Discharge Planning Services: CM Consult Post Acute Care Choice: Skilled Nursing Facility Living arrangements for the past 2 months: Mobile Home                                       Social Determinants of Health (SDOH) Interventions    Readmission Risk Interventions No flowsheet data found.

## 2020-12-26 NOTE — Progress Notes (Addendum)
2100- Patient SpO2 78% on RA. Placed on 10L HFNC, SPO2 increased to 92%   0130- Patient yellow mews started due to elevated respirations in the 30s and SPO2 84-86 on 15L HFNC. Patient placed on 5L NRB, however patient is confused at keeps removing NRB.  Notified Dr. Rachael Darby, received orders for bilateral soft wrist restraints and an ABG

## 2020-12-27 ENCOUNTER — Inpatient Hospital Stay (HOSPITAL_COMMUNITY): Payer: Medicare Other

## 2020-12-27 DIAGNOSIS — G9340 Encephalopathy, unspecified: Secondary | ICD-10-CM | POA: Diagnosis not present

## 2020-12-27 LAB — BLOOD GAS, ARTERIAL
Acid-base deficit: 2.6 mmol/L — ABNORMAL HIGH (ref 0.0–2.0)
Acid-base deficit: 0.2 mmol/L (ref 0.0–2.0)
Bicarbonate: 20.9 mmol/L (ref 20.0–28.0)
Bicarbonate: 22.4 mmol/L (ref 20.0–28.0)
Drawn by: 311011
FIO2: 40
FIO2: 100
O2 Saturation: 98.4 %
O2 Saturation: 80.6 %
Patient temperature: 37
Patient temperature: 38.1
pCO2 arterial: 31 mmHg — ABNORMAL LOW (ref 32.0–48.0)
pCO2 arterial: 29.2 mmHg — ABNORMAL LOW (ref 32.0–48.0)
pH, Arterial: 7.443 (ref 7.350–7.450)
pH, Arterial: 7.503 — ABNORMAL HIGH (ref 7.350–7.450)
pO2, Arterial: 123 mmHg — ABNORMAL HIGH (ref 83.0–108.0)
pO2, Arterial: 47.9 mmHg — ABNORMAL LOW (ref 83.0–108.0)

## 2020-12-27 LAB — CBC
HCT: 32.4 % — ABNORMAL LOW (ref 39.0–52.0)
Hemoglobin: 10.9 g/dL — ABNORMAL LOW (ref 13.0–17.0)
MCH: 30.8 pg (ref 26.0–34.0)
MCHC: 33.6 g/dL (ref 30.0–36.0)
MCV: 91.5 fL (ref 80.0–100.0)
Platelets: 230 10*3/uL (ref 150–400)
RBC: 3.54 MIL/uL — ABNORMAL LOW (ref 4.22–5.81)
RDW: 13.5 % (ref 11.5–15.5)
WBC: 9.4 10*3/uL (ref 4.0–10.5)
nRBC: 0 % (ref 0.0–0.2)

## 2020-12-27 LAB — ZINC: Zinc: 49 ug/dL (ref 44–115)

## 2020-12-27 LAB — COPPER, SERUM: Copper: 161 ug/dL — ABNORMAL HIGH (ref 69–132)

## 2020-12-27 MED ORDER — LACTATED RINGERS IV BOLUS
500.0000 mL | Freq: Once | INTRAVENOUS | Status: AC
  Administered 2020-12-27: 500 mL via INTRAVENOUS

## 2020-12-27 MED ORDER — SODIUM CHLORIDE 0.9 % IV SOLN
3.0000 g | Freq: Four times a day (QID) | INTRAVENOUS | Status: AC
  Administered 2020-12-27 – 2021-01-02 (×27): 3 g via INTRAVENOUS
  Filled 2020-12-27 (×27): qty 8

## 2020-12-27 MED ORDER — FUROSEMIDE 10 MG/ML IJ SOLN
40.0000 mg | Freq: Once | INTRAMUSCULAR | Status: AC
  Administered 2020-12-27: 40 mg via INTRAVENOUS
  Filled 2020-12-27: qty 4

## 2020-12-27 NOTE — Progress Notes (Signed)
   12/27/20 1953  Assess: MEWS Score  Temp (!) 100.7 F (38.2 C)  BP (!) 89/61  Pulse Rate 94  ECG Heart Rate 94  Resp (!) 32  Level of Consciousness Responds to Voice  SpO2 (!) 87 %  O2 Device HFNC;Non-rebreather Mask  O2 Flow Rate (L/min) 15 L/min  Assess: MEWS Score  MEWS Temp 1  MEWS Systolic 1  MEWS Pulse 0  MEWS RR 2  MEWS LOC 1  MEWS Score 5  MEWS Score Color Red  Assess: if the MEWS score is Yellow or Red  Were vital signs taken at a resting state? Yes  Focused Assessment No change from prior assessment  Early Detection of Sepsis Score *See Row Information* Low  MEWS guidelines implemented *See Row Information* Yes  Treat  MEWS Interventions Consulted Respiratory Therapy  Pain Scale 0-10  Pain Score Asleep  Take Vital Signs  Increase Vital Sign Frequency  Red: Q 1hr X 4 then Q 4hr X 4, if remains red, continue Q 4hrs  Escalate  MEWS: Escalate Red: discuss with charge nurse/RN and provider, consider discussing with RRT  Notify: Charge Nurse/RN  Name of Charge Nurse/RN Notified [place], RN  Date Charge Nurse/RN Notified 12/27/20  Time Charge Nurse/RN Notified 2002  Notify: Provider  Provider Name/Title Dr. Rachael Darby  Date Provider Notified 12/27/20  Time Provider Notified 2003  Notification Type Page  Notification Reason Other (Comment) (VS)  Provider response See new orders (CBC and ABG)  Date of Provider Response 12/27/20  Time of Provider Response 2010

## 2020-12-27 NOTE — Progress Notes (Signed)
   12/27/20 0131  Assess: MEWS Score  Temp 98.2 F (36.8 C)  BP 132/68  Pulse Rate 78  ECG Heart Rate 99  Resp (!) 28  SpO2 90 %  O2 Device HFNC  O2 Flow Rate (L/min) 10 L/min  Assess: MEWS Score  MEWS Temp 0  MEWS Systolic 0  MEWS Pulse 0  MEWS RR 2  MEWS LOC 0  MEWS Score 2  MEWS Score Color Yellow  Assess: if the MEWS score is Yellow or Red  Were vital signs taken at a resting state? Yes  Focused Assessment No change from prior assessment  Early Detection of Sepsis Score *See Row Information* Low  MEWS guidelines implemented *See Row Information* Yes  Treat  MEWS Interventions Consulted Respiratory Therapy  Pain Scale 0-10  Pain Score Asleep  Take Vital Signs  Increase Vital Sign Frequency  Yellow: Q 2hr X 2 then Q 4hr X 2, if remains yellow, continue Q 4hrs  Escalate  MEWS: Escalate Yellow: discuss with charge nurse/RN and consider discussing with provider and RRT  Notify: Charge Nurse/RN  Name of Charge Nurse/RN Notified [place], RN  Date Charge Nurse/RN Notified 12/27/20  Time Charge Nurse/RN Notified 0134  Notify: Provider  Provider Name/Title Dr. Lewis Moccasin  Date Provider Notified 12/27/20  Time Provider Notified 0134  Notification Type Page  Notification Reason Other (Comment) (elevated respirations)

## 2020-12-27 NOTE — Progress Notes (Signed)
PROGRESS NOTE    [name]  DGL:[id number] DOB: [birth year] DOA: 12/23/2020 PCP: Rinaldo Cloud, MD    Brief Narrative:  [name] is a 65 year old male with past medical history significant for multiple sclerosis, EtOH abuse, bipolar disorder, history of GI bleed 2/2 gastric ulcer/esophagitis, tobacco use disorder who presents to [facility] ED via EMS for confusion and being found down by his roommate for likely 3 days.  Roommate reports generalized weakness and confusion for several days.  Apparently the patient has been laying in his bed for 3 days covered in feces and urine; however patient has been refusing any type of assistance.  Per EMS, patient living in disheveled/poor environment; yes report was sent.  In the ED, temperature 9 9.9 F, HR 98, RR 22, BP 134/84, SPO2 97% on room air.  Sodium 138, potassium 5.1, chloride 107, CO2 19, glucose 93, BUN 27, creatinine 1.24, AST 74, ALT 35, total bilirubin 1.5.  CK 2476, lactic acid 1.8.  WBC 7.1, hemoglobin 11.8, platelets 241.  INR 1.0.  COVID-19 PCR negative.  Influenza A/B PCR negative.  Urinalysis negative.  UDS positive for THC.  Chest x-ray with no acute cardiopulmonary disease process.  X-ray pelvis with no acute bony abnormality.  CT head without contrast with atrophy, chronic microvascular disease without acute intracranial abnormality. EDP consulted hospitalist service for admission for further evaluation and management of acute metabolic encephalopathy with mild rhabdomyolysis, weakness, adult failure to thrive.   Assessment & Plan:   Principal Problem:   Acute encephalopathy Active Problems:   MS (multiple sclerosis) (HCC)   Alcohol abuse   Bipolar I disorder (HCC)   Polysubstance abuse (HCC)   Decubitus ulcer of hip   Rhabdomyolysis   Protein-calorie malnutrition, severe   Acute metabolic encephalopathy, POA: Improving Patient presenting from home via EMS after being found by his roommate in his bed for at least the last 3  days.  He has had increasing weakness, confusion.  On arrival, patient is afebrile without leukocytosis.  UDS positive for THC, EtOH level less than 10.  CT head with cerebral atrophy without acute findings.  Urinalysis unremarkable.  Ammonia level within normal limits, lactic acid within normal limits.  Covid-19/influenza A/B negative.  Continues with chronic alcohol abuse with elevated LFTs.  No infectious etiology, suspect related to his chronic comorbidities, continued alcohol/tobacco abuse and refusal to take his home medications.  Acute hypoxic respiratory failure likely 2/2 aspiration pneumonia Overnight, patient became more hypoxic and confused.  Unclear etiology suspect related to possible mild withdrawal symptoms versus aspiration pneumonia.  Now requiring 10 L per NRB.  He with pH 7.4, PaCO2 31.0, PaO2 123.  Tray with interval development of left basilar heterogeneous/consolidative opacities concerning for aspiration pneumonia and similar findings of cardiomegaly and pulmonary venous congestion without frank evidence of edema. --Furosemide 40 mg IV x1 --Start Unasyn for concern of aspiration pneumonia --Continue supplemental oxygen, maintain SPO2 > 92%, currently on 10 L NRB with SPO2 now up to 100%; likely can attempt to wean --Aspiration precautions  Mild rhabdomyolysis, POA Per roommates report, patient has been lying in his bed for several days.  Patient has a right hip wound and elevated CK level of 2476. --Discontinue IV fluids --Supportive care  Right hip wound, POA Patient with right hip wound, eschar with blistering. Evaluated by wound care.  Discussed with general surgery regarding possible need of bedside debridement, recommended Santyl and hydrotherapy. --Santyl daily --PT following for hydrotherapy  Staph epidermidis septicemia likely 2/2  contaminant 2 out of 4 blood cultures on admission positive for staph epidermidis.  Suspect contaminant.  [facility] on  [facility], now discontinued.  Patient is afebrile without leukocytosis. --Repeat blood cultures 9/4: Growth less than 24 hours --Continue to monitor clinically off of antibiotics  EtOH use disorder Patient reports that he drinks "3 packs/day".  Does not discern type of drink or actual amount.  Patient's LFTs notably elevated which are consistent with alcohol abuse. --CIWA protocol with symptom triggered Ativan --Multivitamin, folic acid, thiamine --Social work for substance abuse  Tobacco use disorder Patient reports smokes 3 packs/day.  Counseled on need for complete cessation. --Nicotine patch  Hx multiple sclerosis, relapsing remitting Follows with Neurology outpatient; Dr. Epimenio FootSater. Last seen in clinic 05/05/2020. On Tecfidera since 2013; but patient reports not taking any medications currently.  MR brain with and without contrast with advanced white matter disease that has progressed from 2019 with no acute/subacute insult. --will hold restarting Tecfidera for now since he is non-compliant outpatient --Outpatient follow-up with neurology  Hx Bipolar disorder Hx Depression with suicidal ideation Prescribe sertraline 50 mg p.o. daily, olanzapine 10 mg p.o. nightly, although reports not taking any medications. --Sertraline at reduced dose 25 mg p.o. daily --Olanzapine 10 mg p.o. nightly  Adult failure to thrive Patient brought in by EMS after being found in his bed for several days by roommate.  Per EMS report, house in deplorable condition with right hip wound noted and patient disheveled in appearance covered in urine and feces. --Adult protective services currently seeking court appointed guardianship. --Social work for evaluation  Weakness/debility/deconditioning: --PT/OT recommending SNF placement --TOC consulted for assistance with SNF placement   DVT prophylaxis: SCDs Start: 12/24/20 [id number]   Code Status: Full Code Family Communication: No family  present at bedside  Disposition Plan:  Level of care: Telemetry Medical Status is: Inpatient  Remains inpatient appropriate because:Unsafe d/c plan, IV treatments appropriate due to intensity of illness or inability to take PO, Inpatient level of care appropriate due to severity of illness, and APS currently seeking court appointed guardianship  Dispo: The patient is from: Home              Anticipated d/c is to: SNF              Patient currently is not medically stable to d/c.   Difficult to place patient No  Consultants:  Discussed with general surgery in 9/4  Procedures:  None  Antimicrobials:  Unasyn 9/6>> Vancomycin 9/3 - 9/4   Subjective: Patient seen examined at bedside, resting comfortably.  Overnight became more hypoxic, ABG looks okay but chest x-ray with concern for aspiration pneumonia.  Received 1 dose of IV Lasix, starting on Unasyn.   More confused this morning, but no specific complaints.  No other acute events overnight per nursing staff.  Objective: Vitals:   12/27/20 0153 12/27/20 0341 12/27/20 0530 12/27/20 1248  BP:  129/87 118/81 106/70  Pulse: 98 100 96 93  Resp: (!) 34 (!) 21 (!) 33 20  Temp:  98.5 F (36.9 C) 98.9 F (37.2 C) 98.8 F (37.1 C)  TempSrc:  Axillary Oral Oral  SpO2: 92% 95% 92% 100%  Weight:      Height:        Intake/Output Summary (Last 24 hours) at 12/27/2020 1307 Last data filed at 12/27/2020 0500 Gross per 24 hour  Intake 1121.36 ml  Output 300 ml  Net 821.36 ml   [employer]  12/23/20 2115  Weight: 72.6 kg    Examination:  General exam: Appears calm and comfortable, chronically ill in appearance, appears older than stated age, disheveled, unkempt, confused this morning Respiratory system: Clear to auscultation. Respiratory effort normal.  On room air Cardiovascular system: S1 & S2 heard, RRR. No JVD, murmurs, rubs, gallops or clicks. No pedal edema. Gastrointestinal system: Abdomen is nondistended, soft and  nontender. No organomegaly or masses felt. Normal bowel sounds heard. Central nervous system: Alert, confused. No focal neurological deficits. Extremities: Chronic skin changes bilateral lower extremities, moves all extremities independently Skin: Right hip wound with eschar/blister as depicted below  Psychiatry: Judgement and insight appear poor.  Depressed mood & flat affect       Data Reviewed: I have personally reviewed following labs and imaging studies  CBC: Recent Labs  Lab 12/23/20 2137  WBC 7.1  NEUTROABS 5.7  HGB 11.8*  HCT 37.5*  MCV 97.4  PLT 241   Basic Metabolic Panel: Recent Labs  Lab 12/23/20 2137 12/24/20 0258 12/25/20 0024  NA 138 138 140  K 5.1 4.6 4.0  CL 107 109 110  CO2 19* 17* 20*  GLUCOSE 93 95 90  BUN 27* 27* 22  CREATININE 1.24 1.07 0.95  CALCIUM 9.0 9.0 8.7*  MG  --  2.1  --   PHOS  --  3.6  --    GFR: Estimated Creatinine Clearance: 79.6 mL/min (by C-G formula based on SCr of 0.95 mg/dL). Liver Function Tests: Recent Labs  Lab 12/23/20 2137 12/24/20 0258 12/25/20 0024  AST 74* 62* 43*  ALT 35 32 27  ALKPHOS 68 71 63  BILITOT 1.5* 1.5* 1.5*  PROT 6.5 6.6 5.7*  ALBUMIN 3.0* 3.0* 2.5*   No results for input(s): LIPASE, AMYLASE in the last 168 hours. Recent Labs  Lab 12/24/20 0428  AMMONIA 23   Coagulation Profile: Recent Labs  Lab 12/23/20 2137 12/24/20 0258  INR 1.0 1.0   Cardiac Enzymes: Recent Labs  Lab 12/23/20 2137  CKTOTAL 2,476*   BNP (last 3 results) No results for input(s): PROBNP in the last 8760 hours. HbA1C: No results for input(s): HGBA1C in the last 72 hours. CBG: No results for input(s): GLUCAP in the last 168 hours. Lipid Profile: No results for input(s): CHOL, HDL, LDLCALC, TRIG, CHOLHDL, LDLDIRECT in the last 72 hours. Thyroid Function Tests: No results for input(s): TSH, T4TOTAL, FREET4, T3FREE, THYROIDAB in the last 72 hours. Anemia Panel: No results for input(s): VITAMINB12, FOLATE,  FERRITIN, TIBC, IRON, RETICCTPCT in the last 72 hours. Sepsis Labs: Recent Labs  Lab 12/23/20 2137  LATICACIDVEN 1.8    Recent Results (from the past 240 hour(s))  Urine Culture     Status: None   Collection Time: 12/23/20  9:38 PM   Specimen: In/Out Cath Urine  Result Value Ref Range Status   Specimen Description IN/OUT CATH URINE  Final   Special Requests NONE  Final   Culture   Final    NO GROWTH Performed at [facility], [address] [place], [place] [address]    Report Status 12/25/2020 FINAL  Final  Culture, blood (Routine x 2)     Status: Abnormal   Collection Time: 12/23/20 10:15 PM   Specimen: BLOOD  Result Value Ref Range Status   Specimen Description BLOOD LEFT ANTECUBITAL  Final   Special Requests   Final    BOTTLES DRAWN AEROBIC AND ANAEROBIC Blood Culture adequate volume   Culture  Setup Time  Final    GRAM POSITIVE COCCI IN BOTH AEROBIC AND ANAEROBIC BOTTLES CRITICAL RESULT CALLED TO, READ BACK BY AND VERIFIED WITH: L CURRAN PHARMD @1706  12/24/20 EB    Culture (A)  Final    STAPHYLOCOCCUS HOMINIS STAPHYLOCOCCUS SIMULANS STAPHYLOCOCCUS EPIDERMIDIS THE SIGNIFICANCE OF ISOLATING THIS ORGANISM FROM A SINGLE SET OF BLOOD CULTURES WHEN MULTIPLE SETS ARE DRAWN IS UNCERTAIN. PLEASE NOTIFY THE MICROBIOLOGY DEPARTMENT WITHIN ONE WEEK IF SPECIATION AND SENSITIVITIES ARE REQUIRED. Performed at [facility], [address], [place], [place]    Report Status 12/26/2020 FINAL  Final  Blood Culture ID Panel (Reflexed)     Status: Abnormal   Collection Time: 12/23/20 10:15 PM  Result Value Ref Range Status   Enterococcus faecalis NOT DETECTED NOT DETECTED Final   Enterococcus Faecium NOT DETECTED NOT DETECTED Final   Listeria monocytogenes NOT DETECTED NOT DETECTED Final   Staphylococcus species DETECTED (A) NOT DETECTED Final    Comment: CRITICAL RESULT CALLED TO, READ BACK BY AND VERIFIED WITH: L CURRAN PHARMD @1706  12/24/20 EB     Staphylococcus aureus (BCID) NOT DETECTED NOT DETECTED Final   Staphylococcus epidermidis DETECTED (A) NOT DETECTED Final    Comment: CRITICAL RESULT CALLED TO, READ BACK BY AND VERIFIED WITH: L CURRAN PHARMD @1706  12/24/20 EB    Staphylococcus lugdunensis NOT DETECTED NOT DETECTED Final   Streptococcus species NOT DETECTED NOT DETECTED Final   Streptococcus agalactiae NOT DETECTED NOT DETECTED Final   Streptococcus pneumoniae NOT DETECTED NOT DETECTED Final   Streptococcus pyogenes NOT DETECTED NOT DETECTED Final   A.calcoaceticus-baumannii NOT DETECTED NOT DETECTED Final   Bacteroides fragilis NOT DETECTED NOT DETECTED Final   Enterobacterales NOT DETECTED NOT DETECTED Final   Enterobacter cloacae complex NOT DETECTED NOT DETECTED Final   Escherichia coli NOT DETECTED NOT DETECTED Final   Klebsiella aerogenes NOT DETECTED NOT DETECTED Final   Klebsiella oxytoca NOT DETECTED NOT DETECTED Final   Klebsiella pneumoniae NOT DETECTED NOT DETECTED Final   Proteus species NOT DETECTED NOT DETECTED Final   Salmonella species NOT DETECTED NOT DETECTED Final   Serratia marcescens NOT DETECTED NOT DETECTED Final   Haemophilus influenzae NOT DETECTED NOT DETECTED Final   Neisseria meningitidis NOT DETECTED NOT DETECTED Final   Pseudomonas aeruginosa NOT DETECTED NOT DETECTED Final   Stenotrophomonas maltophilia NOT DETECTED NOT DETECTED Final   Candida albicans NOT DETECTED NOT DETECTED Final   Candida auris NOT DETECTED NOT DETECTED Final   Candida glabrata NOT DETECTED NOT DETECTED Final   Candida krusei NOT DETECTED NOT DETECTED Final   Candida parapsilosis NOT DETECTED NOT DETECTED Final   Candida tropicalis NOT DETECTED NOT DETECTED Final   Cryptococcus neoformans/gattii NOT DETECTED NOT DETECTED Final   Methicillin resistance mecA/C NOT DETECTED NOT DETECTED Final    Comment: Performed at [facility], [address], [place], [facility] [address]  Culture, blood (Routine x 2)      Status: None (Preliminary result)   Collection Time: 12/23/20 10:20 PM   Specimen: BLOOD RIGHT HAND  Result Value Ref Range Status   Specimen Description BLOOD RIGHT HAND  Final   Special Requests   Final    BOTTLES DRAWN AEROBIC AND ANAEROBIC Blood Culture adequate volume   Culture   Final    NO GROWTH 3 DAYS Performed at [facility], [address], [place], [facility] [address]    Report Status PENDING  Incomplete  Resp Panel by RT-PCR (Flu A&B, Covid) Nasopharyngeal Swab  Status: None   Collection Time: 12/23/20 10:30 PM   Specimen: Nasopharyngeal Swab; Nasopharyngeal(NP) swabs in vial transport medium  Result Value Ref Range Status   SARS Coronavirus 2 by RT PCR NEGATIVE NEGATIVE Final    Comment: (NOTE) SARS-CoV-2 target nucleic acids are NOT DETECTED.  The SARS-CoV-2 RNA is generally detectable in upper respiratory specimens during the acute phase of infection. The lowest concentration of SARS-CoV-2 viral copies this assay can detect is 138 copies/mL. A negative result does not preclude SARS-Cov-2 infection and should not be used as the sole basis for treatment or other patient management decisions. A negative result may occur with  improper specimen collection/handling, submission of specimen other than nasopharyngeal swab, presence of viral mutation(s) within the areas targeted by this assay, and inadequate number of viral copies(<138 copies/mL). A negative result must be combined with clinical observations, patient history, and epidemiological information. The expected result is Negative.  Fact Sheet for Patients:  BloggerCourse.com  Fact Sheet for Healthcare Providers:  SeriousBroker.it  This test is no t yet approved or cleared by the [place] FDA and  has been authorized for detection and/or diagnosis of SARS-CoV-2 by FDA under an Emergency Use Authorization (EUA). This EUA will remain  in effect  (meaning this test can be used) for the duration of the COVID-19 declaration under Section 564(b)(1) of the Act, 21 U.S.C.section 360bbb-3(b)(1), unless the authorization is terminated  or revoked sooner.       Influenza A by PCR NEGATIVE NEGATIVE Final   Influenza B by PCR NEGATIVE NEGATIVE Final    Comment: (NOTE) The Xpert Xpress SARS-CoV-2/FLU/RSV plus assay is intended as an aid in the diagnosis of influenza from Nasopharyngeal swab specimens and should not be used as a sole basis for treatment. Nasal washings and aspirates are unacceptable for Xpert Xpress SARS-CoV-2/FLU/RSV testing.  Fact Sheet for Patients: BloggerCourse.com  Fact Sheet for Healthcare Providers: SeriousBroker.it  This test is not yet approved or cleared by the [place] FDA and has been authorized for detection and/or diagnosis of SARS-CoV-2 by FDA under an Emergency Use Authorization (EUA). This EUA will remain in effect (meaning this test can be used) for the duration of the COVID-19 declaration under Section 564(b)(1) of the Act, 21 U.S.C. section 360bbb-3(b)(1), unless the authorization is terminated or revoked.  Performed at [facility], [address], [place], [place] [address]   Culture, blood (routine x 2)     Status: None (Preliminary result)   Collection Time: 12/25/20  8:06 AM   Specimen: BLOOD  Result Value Ref Range Status   Specimen Description BLOOD BLOOD RIGHT HAND  Final   Special Requests   Final    BOTTLES DRAWN AEROBIC AND ANAEROBIC Blood Culture adequate volume   Culture   Final    NO GROWTH 2 DAYS Performed at [facility], [address], [place], [place] [address]    Report Status PENDING  Incomplete  Culture, blood (routine x 2)     Status: None (Preliminary result)   Collection Time: 12/25/20  8:07 AM   Specimen: BLOOD  Result Value Ref Range Status   Specimen Description BLOOD BLOOD RIGHT HAND  Final    Special Requests   Final    BOTTLES DRAWN AEROBIC AND ANAEROBIC Blood Culture adequate volume   Culture   Final    NO GROWTH 2 DAYS Performed at [facility], [address] [place], [place] [address]    Report Status PENDING  Incomplete         Radiology Studies: DG CHEST PORT 1 VIEW  Result Date: 12/27/2020 CLINICAL DATA:  Shortness of breath.  Failure to thrive. EXAM: PORTABLE CHEST 1 VIEW COMPARISON:  12/23/2020; 06/01/2020 FINDINGS: The examination is degraded due to portable technique and patient positioning. Grossly unchanged borderline enlarged cardiac silhouette and mediastinal contours given reduced lung volumes and patient rotation. Atherosclerotic plaque within the thoracic aorta. Interval development of left basilar heterogeneous/consolidative opacities. Mild pulmonary venous congestion without frank evidence of edema. Suspected trace left-sided effusion. No pneumothorax. No acute osseous abnormalities. IMPRESSION: 1. Interval development of left basilar heterogeneous/consolidative opacities - atelectasis versus infiltrate/aspiration. Further evaluation with a PA and lateral chest radiograph may be obtained as clinically indicated. 2. Similar findings of cardiomegaly and pulmonary venous congestion without frank evidence of edema. Electronically Signed   By: Simonne Come M.D.   On: 12/27/2020 08:24        Scheduled Meds:  collagenase   Topical Daily   feeding supplement  237 mL Oral TID BM   folic acid  1 mg Oral Daily   Gerhardt's butt cream   Topical TID   multivitamin with minerals  1 tablet Oral Daily   OLANZapine  10 mg Oral QHS   sertraline  25 mg Oral Daily   thiamine  100 mg Oral Daily   Continuous Infusions:  ampicillin-sulbactam (UNASYN) IV 3 g (12/27/20 0850)     LOS: 2 days    Time spent: 38 minutes spent on chart review, discussion with nursing staff, consultants, updating family and interview/physical exam; more than 50% of that time was spent  in counseling and/or coordination of care.    Alvira Philips [place], DO [facility] Available via Epic secure chat 7am-7pm After these hours, please refer to coverage provider listed on [email or web address] 12/27/2020, 1:07 PM

## 2020-12-27 NOTE — Progress Notes (Addendum)
Notified by RN that O2 sats are dropping to 87-88% on 15 L NRB and 15 L HFNC.  Will come up briefly to 91% if stimulated but drops back under 90% in short amount of time.   Has acute metabolic encephalopathy and will open eyes to tactile stimulation but does not verbally respond. Hx of ETOH abuse and on CIWA protocol. Had aspiration other day and is on Unasyn Is on SCDs for DVT prophylaxis since admitted.    CV-RRR Resp-Equal but diminished breath sounds with diffuse rales and left basilar rhonchi Abdomen-soft, nondistended.  ABG-pH-7.503, pCO2 29.2, pO2 47.9, bicarb 22.4  Obtain CTA chest to rule out PE and better characterize pneumonia/edema.  May need intubation if not able to protect airway with encephalopathy.   Addendum: CTA shows PE that is submassive with Right heart strain. Has collapse of left lower lung. Placed on heparin infusion. Discussed with PCCM and critical care to evaluate and transfer to ICU as will likely need intubation for airway protection

## 2020-12-27 NOTE — Progress Notes (Signed)
Physical Therapy Wound Treatment Patient Details  Name: [name] MRN: [id number] Date of Birth: 04-30-1955  Today's Date: 12/27/2020 Time: 4270-6237 Time Calculation (min): 36 min  Subjective  Subjective Assessment Subjective: Pt mumbling unintelligibly Patient and Family Stated Goals: None stated Date of Onset:  (Unknown - PTA) Prior Treatments: Dressing changes  Pain Score:    Wound Assessment  Pressure Injury 12/24/20 Hip Right Unstageable - Full thickness tissue loss in which the base of the injury is covered by slough (yellow, tan, gray, green or brown) and/or eschar (tan, brown or black) in the wound bed. Black (eschar) surrounded by yellow (Active)  Dressing Type ABD;Barrier Film (skin prep);Gauze (Comment);Santyl 12/27/20 1300  Dressing Changed;Clean;Dry;Intact 12/27/20 1300  Dressing Change Frequency Daily 12/27/20 1300  State of Healing Eschar 12/27/20 1300  Site / Wound Assessment Brown;Black;Yellow;Pink 12/27/20 1300  % Wound base Red or Granulating 10% 12/27/20 1300  % Wound base Yellow/Fibrinous Exudate 70% 12/27/20 1300  % Wound base Black/Eschar 20% 12/27/20 1300  % Wound base Other/Granulation Tissue (Comment) 0% 12/27/20 1300  Peri-wound Assessment Intact 12/27/20 1300  Wound Length (cm) 8 cm 12/26/20 1045  Wound Width (cm) 4 cm 12/26/20 1045  Wound Depth (cm) 0 cm 12/26/20 1045  Wound Surface Area (cm^2) 32 cm^2 12/26/20 1045  Wound Volume (cm^3) 0 cm^3 12/26/20 1045  Tunneling (cm) 0 12/26/20 1121  Undermining (cm) 0 12/26/20 1121  Margins Unattached edges (unapproximated) 12/27/20 1300  Drainage Amount Minimal 12/27/20 1300  Drainage Description No odor;Purulent 12/27/20 1300  Treatment Debridement (Selective);Hydrotherapy (Pulse lavage) 12/27/20 1300      Hydrotherapy Pulsed lavage therapy - wound location: R hip Pulsed Lavage with Suction (psi): 8 psi Pulsed Lavage with Suction - Normal Saline Used: 1000 mL Pulsed Lavage Tip: Tip with splash  shield Selective Debridement Selective Debridement - Location: R hip Selective Debridement - Tools Used: Forceps, Scalpel, Scissors Selective Debridement - Tissue Removed: yellow, brown nonviable tissue    Wound Assessment and Plan  Wound Therapy - Assess/Plan/Recommendations Wound Therapy - Clinical Statement: Blister on rt hip now open with pink tissue visible. Continue to work on debriding necrotic rt hip wound. Will continue hydrotherapy to facilitate removal of necrotic tissue. Wound Therapy - Functional Problem List: Decreased tolerance for positioning OOB due to MS and wound. Factors Delaying/Impairing Wound Healing: Immobility, Multiple medical problems Hydrotherapy Plan: Debridement, Dressing change, Patient/family education, Pulsatile lavage with suction Wound Therapy - Frequency: 6X / week Wound Therapy - Follow Up Recommendations: dressing changes by RN  Wound Therapy Goals- Improve the function of patient's integumentary system by progressing the wound(s) through the phases of wound healing (inflammation - proliferation - remodeling) by: Wound Therapy Goals - Improve the function of patient's integumentary system by progressing the wound(s) through the phases of wound healing by: Decrease Necrotic Tissue to: 20% Decrease Necrotic Tissue - Progress: Progressing toward goal Increase Granulation Tissue to: 80% Increase Granulation Tissue - Progress: Progressing toward goal  Goals will be updated until maximal potential achieved or discharge criteria met.  Discharge criteria: when goals achieved, discharge from hospital, MD decision/surgical intervention, no progress towards goals, refusal/missing three consecutive treatments without notification or medical reason.  GP     Charges PT Wound Care Charges $Wound Debridement up to 20 cm: < or equal to 20 cm $ Wound Debridement each add'l 20 sqcm: 1 $PT PLS Gun and Tip: 1 Supply $PT Hydrotherapy Visit: 1 Visit       [place] 12/27/2020, 1:32 PM [employer]  [place] Pager [phone] Office [phone]

## 2020-12-27 NOTE — Progress Notes (Signed)
Physical Therapy Treatment Patient Details Name: [name] MRN: [id number] DOB: 12-08-1955 Today's Date: 12/27/2020    History of Present Illness Pt is a 65 y.o. male who presented 12/23/20 with generalized weakness and AMS. Per chart, pt's roommate called EMS after pt has been laying in bed for past 3 days, covered in feces and urine with worsening contractures. Pending work-up. MRI showed small remote cerebellar infarcts, but no acute or subacute insult. Pt with acute metabolic encephalopathy. Pt deveoloped hypoxi 9/6 and place on NRB.  PMH: MS, EtOH abuse, depression, BPD, prior GIB from gastric ulcer and esophagitis in 2019    PT Comments    Pt making very slow progress with mobility. Worked on stretching of LE's prior to mobility. Able to sit EOB with assist but unable to achieve standing. Continue to recommend SNF.    Follow Up Recommendations  SNF;Supervision/Assistance - 24 hour     Equipment Recommendations  Other (comment);Hospital bed;Wheelchair cushion (measurements PT);Wheelchair (measurements PT);3in1 (PT) (mechanical lift)    Recommendations for Other Services       Precautions / Restrictions Precautions Precautions: Fall Precaution Comments: bil leg contractures; pressure ulcer R lateral hip; high risk for skin breakdown; decreased A/PROM Bil UEs    Mobility  Bed Mobility Overal bed mobility: Needs Assistance Bed Mobility: Rolling;Sidelying to Sit;Sit to Sidelying Rolling: Total assist Sidelying to sit: Total assist;HOB elevated     Sit to sidelying: Max assist;HOB elevated General bed mobility comments: Assist for all aspects of mobility    Transfers Overall transfer level: Needs assistance Equipment used: Rolling walker (2 wheeled)             General transfer comment: Attempted sit to stand with +2 max assist but unable to clear hips  Ambulation/Gait                 Stairs             Wheelchair Mobility    Modified Rankin  (Stroke Patients Only)       Balance Overall balance assessment: Needs assistance Sitting-balance support: Bilateral upper extremity supported;Feet supported Sitting balance-Leahy Scale: Poor Sitting balance - Comments: Pt sat EOB x 10 minutes with min to min guard assist                                    Cognition Arousal/Alertness: Awake/alert Behavior During Therapy: Flat affect Overall Cognitive Status: Difficult to assess                                 General Comments: Pt mumbling unintelligibly. Did not follow 1 step commands.      Exercises      General Comments        Pertinent Vitals/Pain Pain Assessment: Faces Faces Pain Scale: Hurts even more Pain Location: Lt hip with initial flexion but improved with gentle ROM Pain Descriptors / Indicators: Grimacing;Guarding;Moaning Pain Intervention(s): Limited activity within patient's tolerance;Monitored during session;Repositioned    Home Living                      Prior Function            PT Goals (current goals can now be found in the care plan section) Progress towards PT goals: Progressing toward goals    Frequency    Min 2X/week  PT Plan Current plan remains appropriate    Co-evaluation              AM-PAC PT "6 Clicks" Mobility   Outcome Measure  Help needed turning from your back to your side while in a flat bed without using bedrails?: Total Help needed moving from lying on your back to sitting on the side of a flat bed without using bedrails?: Total Help needed moving to and from a bed to a chair (including a wheelchair)?: Total Help needed standing up from a chair using your arms (e.g., wheelchair or bedside chair)?: Total Help needed to walk in hospital room?: Total Help needed climbing 3-5 steps with a railing? : Total 6 Click Score: 6    End of Session Equipment Utilized During Treatment: Gait belt;Oxygen Activity Tolerance: Patient  limited by fatigue Patient left: in bed;with call bell/phone within reach;with bed alarm set Nurse Communication: Mobility status PT Visit Diagnosis: Muscle weakness (generalized) (M62.81);Difficulty in walking, not elsewhere classified (R26.2);Pain;Unsteadiness on feet (R26.81) Pain - Right/Left: Left Pain - part of body: Hip     Time: 8638-1771 PT Time Calculation (min) (ACUTE ONLY): 28 min  Charges:  $Therapeutic Exercise: 23-37 mins                     [facility] PT Acute Rehabilitation Services Pager [phone] Office [phone]    Angelina Ok [facility] 12/27/2020, 1:22 PM

## 2020-12-28 ENCOUNTER — Inpatient Hospital Stay (HOSPITAL_COMMUNITY): Payer: Medicare Other

## 2020-12-28 DIAGNOSIS — I2699 Other pulmonary embolism without acute cor pulmonale: Secondary | ICD-10-CM

## 2020-12-28 DIAGNOSIS — J690 Pneumonitis due to inhalation of food and vomit: Secondary | ICD-10-CM

## 2020-12-28 DIAGNOSIS — I2609 Other pulmonary embolism with acute cor pulmonale: Secondary | ICD-10-CM | POA: Diagnosis not present

## 2020-12-28 DIAGNOSIS — G9340 Encephalopathy, unspecified: Secondary | ICD-10-CM

## 2020-12-28 DIAGNOSIS — J9601 Acute respiratory failure with hypoxia: Secondary | ICD-10-CM | POA: Diagnosis not present

## 2020-12-28 DIAGNOSIS — E43 Unspecified severe protein-calorie malnutrition: Secondary | ICD-10-CM

## 2020-12-28 DIAGNOSIS — F1210 Cannabis abuse, uncomplicated: Secondary | ICD-10-CM | POA: Diagnosis present

## 2020-12-28 DIAGNOSIS — R7989 Other specified abnormal findings of blood chemistry: Secondary | ICD-10-CM | POA: Diagnosis not present

## 2020-12-28 LAB — COMPREHENSIVE METABOLIC PANEL
ALT: 33 U/L (ref 0–44)
AST: 62 U/L — ABNORMAL HIGH (ref 15–41)
Albumin: 2.2 g/dL — ABNORMAL LOW (ref 3.5–5.0)
Alkaline Phosphatase: 80 U/L (ref 38–126)
Anion gap: 8 (ref 5–15)
BUN: 17 mg/dL (ref 8–23)
CO2: 23 mmol/L (ref 22–32)
Calcium: 8.2 mg/dL — ABNORMAL LOW (ref 8.9–10.3)
Chloride: 111 mmol/L (ref 98–111)
Creatinine, Ser: 1.09 mg/dL (ref 0.61–1.24)
GFR, Estimated: >60 mL/min (ref >60)
Glucose, Bld: 96 mg/dL (ref 70–99)
Potassium: 4 mmol/L (ref 3.5–5.1)
Sodium: 142 mmol/L (ref 135–145)
Total Bilirubin: 1.5 mg/dL — ABNORMAL HIGH (ref 0.3–1.2)
Total Protein: 5.4 g/dL — ABNORMAL LOW (ref 6.5–8.1)

## 2020-12-28 LAB — HEPARIN LEVEL (UNFRACTIONATED)
Heparin Unfractionated: 0.22 IU/mL — ABNORMAL LOW (ref 0.30–0.70)
Heparin Unfractionated: 0.21 IU/mL — ABNORMAL LOW (ref 0.30–0.70)

## 2020-12-28 LAB — CBC
HCT: 35 % — ABNORMAL LOW (ref 39.0–52.0)
Hemoglobin: 11.3 g/dL — ABNORMAL LOW (ref 13.0–17.0)
MCH: 30.1 pg (ref 26.0–34.0)
MCHC: 32.3 g/dL (ref 30.0–36.0)
MCV: 93.3 fL (ref 80.0–100.0)
Platelets: 222 10*3/uL (ref 150–400)
RBC: 3.75 MIL/uL — ABNORMAL LOW (ref 4.22–5.81)
RDW: 13.8 % (ref 11.5–15.5)
WBC: 9.6 10*3/uL (ref 4.0–10.5)
nRBC: 0 % (ref 0.0–0.2)

## 2020-12-28 LAB — ECHOCARDIOGRAM LIMITED
Area-P 1/2: 4.57 cm2
Calc EF: 44.1 %
Height: 71 in
S' Lateral: 3.1 cm
Single Plane A2C EF: 34.5 %
Single Plane A4C EF: 53.5 %
Weight: 2331.58 oz

## 2020-12-28 LAB — TROPONIN I (HIGH SENSITIVITY)
Troponin I (High Sensitivity): 61 ng/L — ABNORMAL HIGH (ref <18)
Troponin I (High Sensitivity): 66 ng/L — ABNORMAL HIGH (ref <18)

## 2020-12-28 LAB — MAGNESIUM: Magnesium: 2 mg/dL (ref 1.7–2.4)

## 2020-12-28 LAB — GLUCOSE, CAPILLARY
Glucose-Capillary: 91 mg/dL (ref 70–99)
Glucose-Capillary: 99 mg/dL (ref 70–99)

## 2020-12-28 LAB — BRAIN NATRIURETIC PEPTIDE: B Natriuretic Peptide: 485.7 pg/mL — ABNORMAL HIGH (ref 0.0–100.0)

## 2020-12-28 LAB — LACTIC ACID, PLASMA: Lactic Acid, Venous: 1.4 mmol/L (ref 0.5–1.9)

## 2020-12-28 LAB — MRSA NEXT GEN BY PCR, NASAL: MRSA by PCR Next Gen: NOT DETECTED

## 2020-12-28 LAB — STREP PNEUMONIAE URINARY ANTIGEN: Strep Pneumo Urinary Antigen: NEGATIVE

## 2020-12-28 MED ORDER — CHLORHEXIDINE GLUCONATE CLOTH 2 % EX PADS
6.0000 | MEDICATED_PAD | Freq: Every day | CUTANEOUS | Status: DC
  Administered 2020-12-28 – 2020-12-30 (×4): 6 via TOPICAL

## 2020-12-28 MED ORDER — NOREPINEPHRINE 4 MG/250ML-% IV SOLN
2.0000 ug/min | INTRAVENOUS | Status: DC
  Administered 2020-12-28 – 2020-12-29 (×2): 2 ug/min via INTRAVENOUS
  Filled 2020-12-28: qty 250

## 2020-12-28 MED ORDER — FENTANYL CITRATE (PF) 100 MCG/2ML IJ SOLN
50.0000 ug | INTRAMUSCULAR | Status: DC | PRN
  Administered 2020-12-28 – 2020-12-30 (×3): 50 ug via INTRAVENOUS
  Filled 2020-12-28 (×3): qty 2

## 2020-12-28 MED ORDER — SODIUM CHLORIDE 0.9 % IV SOLN
500.0000 mg | INTRAVENOUS | Status: DC
  Administered 2020-12-28: 500 mg via INTRAVENOUS
  Filled 2020-12-28: qty 500

## 2020-12-28 MED ORDER — IOHEXOL 350 MG/ML SOLN
73.0000 mL | Freq: Once | INTRAVENOUS | Status: AC | PRN
  Administered 2020-12-28: 73 mL via INTRAVENOUS

## 2020-12-28 MED ORDER — HEPARIN BOLUS VIA INFUSION
4000.0000 [IU] | Freq: Once | INTRAVENOUS | Status: AC
  Administered 2020-12-28: 4000 [IU] via INTRAVENOUS
  Filled 2020-12-28: qty 4000

## 2020-12-28 MED ORDER — HEPARIN (PORCINE) 25000 UT/250ML-% IV SOLN
1700.0000 [IU]/h | INTRAVENOUS | Status: DC
  Administered 2020-12-28: 1350 [IU]/h via INTRAVENOUS
  Administered 2020-12-28: 1200 [IU]/h via INTRAVENOUS
  Administered 2020-12-29: 1600 [IU]/h via INTRAVENOUS
  Administered 2020-12-29: 1700 [IU]/h via INTRAVENOUS
  Filled 2020-12-28 (×4): qty 250

## 2020-12-28 MED ORDER — IPRATROPIUM-ALBUTEROL 0.5-2.5 (3) MG/3ML IN SOLN
3.0000 mL | Freq: Four times a day (QID) | RESPIRATORY_TRACT | Status: DC
  Administered 2020-12-28: 3 mL via RESPIRATORY_TRACT
  Filled 2020-12-28 (×2): qty 3

## 2020-12-28 MED ORDER — ACETAMINOPHEN 650 MG RE SUPP: 650.0000 mg | RECTAL | Status: DC | PRN

## 2020-12-28 MED ORDER — OLANZAPINE 5 MG PO TBDP
10.0000 mg | ORAL_TABLET | Freq: Every day | ORAL | Status: DC
  Administered 2020-12-28: 10 mg via ORAL
  Filled 2020-12-28 (×3): qty 2

## 2020-12-28 MED ORDER — SODIUM CHLORIDE 0.9 % IV SOLN
250.0000 mL | INTRAVENOUS | Status: DC
  Administered 2020-12-28 – 2021-01-02 (×2): 250 mL via INTRAVENOUS

## 2020-12-28 MED ORDER — ACETAMINOPHEN 325 MG PO TABS
650.0000 mg | ORAL_TABLET | ORAL | Status: DC | PRN
  Administered 2020-12-28: 650 mg via ORAL
  Filled 2020-12-28 (×2): qty 2

## 2020-12-28 MED ORDER — HEPARIN BOLUS VIA INFUSION
1000.0000 [IU] | Freq: Once | INTRAVENOUS | Status: AC
  Administered 2020-12-28: 1000 [IU] via INTRAVENOUS
  Filled 2020-12-28: qty 1000

## 2020-12-28 MED ORDER — ORAL CARE MOUTH RINSE
15.0000 mL | Freq: Two times a day (BID) | OROMUCOSAL | Status: DC
  Administered 2020-12-28 – 2021-01-05 (×17): 15 mL via OROMUCOSAL

## 2020-12-28 MED ORDER — SODIUM CHLORIDE 0.9 % IV SOLN
100.0000 mg | Freq: Two times a day (BID) | INTRAVENOUS | Status: AC
  Administered 2020-12-29 – 2020-12-30 (×3): 100 mg via INTRAVENOUS
  Filled 2020-12-28 (×3): qty 100

## 2020-12-28 NOTE — Progress Notes (Signed)
NAME:  [name], MRN:  [id number], DOB:  10/16/1955, LOS: 3 ADMISSION DATE:  12/23/2020, CONSULTATION DATE:  12/28/20 REFERRING MD:  Chotiner, CHIEF COMPLAINT:  submassive PE  History of Present Illness:  Mr. [name] is a 65 y.o. M with PMH significant for MS, ETOH abuse, GIB (duodenal ulcer 2019) who was admitted on 12/24/20 after being found by his roommate laying in bed for possibly three days covered in feces.  He was admitted with aspiration pneumonia and treated with Unasyn.  On 9/6 he went from room air to 10L HFNC and then overnight sats dropped to 87-88% on 15L NRB and 15L HFNC with worsening mental status prompting CTA chest which read likely submassive PE with RV/LV ratio of 1.3, therefore PCCM consulted.  Pertinent  Medical History   has a past medical history of Depression, Mental disorder, Movement disorder, MS (multiple sclerosis) (HCC), Multiple sclerosis, relapsing-remitting (HCC), Neuromuscular disorder (HCC), and Vision abnormalities.   Significant Hospital Events: Including procedures, antibiotic start and stop dates in addition to other pertinent events   9/3 Admit to hospitalists, unasyn initiated 9/7 Worsening respiratory and mental status with CTA chest finding of Positive for acute PE with CT evidence of right heart strain (RV/LV Ratio = 1.34) consistent with at least submassive (intermediate risk) PE.   Collapse and consolidation of the left lower lobe. Per IR, no plans for procedure  Interim History / Subjective:   Patient oxygen requirement continuing to improve (now on 11 L HFNC) but blood pressures remain soft.   Objective   Blood pressure 97/68, pulse 84, temperature 97.7 F (36.5 C), temperature source Oral, resp. rate (!) 28, height 5\' 11"  (1.803 m), weight 66.1 kg, SpO2 96 %.        Intake/Output Summary (Last 24 hours) at 12/28/2020 1247 Last data filed at 12/28/2020 0900 Gross per 24 hour  Intake 521.21 ml  Output 1625 ml  Net -1103.79 ml   Filed  Weights   12/23/20 2115 12/28/20 0205  Weight: 72.6 kg 66.1 kg   General:  thin, poorly nourished M, tachypneic but not in severe distress, sleeping but arouses to name HEENT: MM pink/moist Neuro: sleeping but arouses easily, slightly nods to questions but not conversational, calm, no tremors, follows commands CV: s1s2 rrr, no m/r/g PULM:  decreased air movement LLL  Extremities: warm/dry, no edema  Skin: no rashes or lesions  Resolved Hospital Problem list   Rhabdo  Assessment & Plan:   Acute Hypoxic Respiratory Failure secondary to suspected aspiration PNA with LLL collapse and pulmonary embolism Shock ABG 7.5, pCO2 29, pO2 47 on 15L HFNC and 15L non-rebreather. Now only requiring 11L HFNC. Pt was started on heparin gtt. Blood culture with staph epidermidis in 2 bottles. P: -Oxygen requirement improving but patient is still tachypneic with significant oxygen requirement and therefore still at risk for needing intubation.  - Continue HFNC, wean to O2 sat >90 - Per IR, no indication for intervention at this time. Will re-consult if presser requirement increases - Continue Unasyn ( 9/6 - ) - Start azithromycin ( 9/7 - ) - Added Duo-Nebs q6 - Continue Heparin gtt. Can switch to DOAC once blood pressures improve and patient not at risk for needing procedure - f/u echo - Starting levophed for blood pressure management. Wean to MAP >65   Acute Encephalopathy, ETOH use disorder POA Likely metabolic  History of ETOH use disorder MR brain with no acute findings 9/3 P: -Monitor with improving oxygen needs -has not  required any Ativan but on CIWA protocol -continue thiamine, MV, folic acid  History of MS, relapsing remitting  On [employer] outpatient P: -follow up with Neurology outpatient   R Hip wound POA P: -continue Santyl and hydrotherapy  Bipolar Disorder, hx depression and SI with failure to thrive, protein calorie malnutrition and failure to thrive POA P: -PT/OT  recommending SNF placement and adult protective services seeking court appointed guardian -Dietitian following  Best Practice (right click and "Reselect all SmartList Selections" daily)   Diet/type: dysphagia diet (see orders) DVT prophylaxis: systemic heparin GI prophylaxis: N/A Lines: N/A Foley:  N/A Code Status:  full code Last date of multidisciplinary goals of care discussion []   Labs   CBC: Recent Labs  Lab 12/23/20 2137 12/27/20 2050 12/28/20 0055  WBC 7.1 9.4 9.6  NEUTROABS 5.7  --   --   HGB 11.8* 10.9* 11.3*  HCT 37.5* 32.4* 35.0*  MCV 97.4 91.5 93.3  PLT 241 230 222    Basic Metabolic Panel: Recent Labs  Lab 12/23/20 2137 12/24/20 0258 12/25/20 0024 12/28/20 0055  NA 138 138 140 142  K 5.1 4.6 4.0 4.0  CL 107 109 110 111  CO2 19* 17* 20* 23  GLUCOSE 93 95 90 96  BUN 27* 27* 22 17  CREATININE 1.24 1.07 0.95 1.09  CALCIUM 9.0 9.0 8.7* 8.2*  MG  --  2.1  --  2.0  PHOS  --  3.6  --   --    GFR: Estimated Creatinine Clearance: 63.2 mL/min (by C-G formula based on SCr of 1.09 mg/dL). Recent Labs  Lab 12/23/20 2137 12/27/20 2050 12/28/20 0055 12/28/20 0434  WBC 7.1 9.4 9.6  --   LATICACIDVEN 1.8  --   --  1.4    Liver Function Tests: Recent Labs  Lab 12/23/20 2137 12/24/20 0258 12/25/20 0024 12/28/20 0055  AST 74* 62* 43* 62*  ALT 35 32 27 33  ALKPHOS 68 71 63 80  BILITOT 1.5* 1.5* 1.5* 1.5*  PROT 6.5 6.6 5.7* 5.4*  ALBUMIN 3.0* 3.0* 2.5* 2.2*   No results for input(s): LIPASE, AMYLASE in the last 168 hours. Recent Labs  Lab 12/24/20 0428  AMMONIA 23    ABG    Component Value Date/Time   PHART 7.503 (H) 12/27/2020 2130   PCO2ART 29.2 (L) 12/27/2020 2130   PO2ART 47.9 (L) 12/27/2020 2130   HCO3 22.4 12/27/2020 2130   TCO2 22.8 10/30/2011 0540   ACIDBASEDEF 0.2 12/27/2020 2130   O2SAT 80.6 12/27/2020 2130     Coagulation Profile: Recent Labs  Lab 12/23/20 2137 12/24/20 0258  INR 1.0 1.0    Cardiac Enzymes: Recent  Labs  Lab 12/23/20 2137  CKTOTAL 2,476*    HbA1C: Hgb A1c MFr Bld  Date/Time Value Ref Range Status  05/06/2009 06:05 AM  4.6 - 6.1 % Final   5.6 (NOTE) The ADA recommends the following therapeutic goal for glycemic control related to Hgb A1c measurement: Goal of therapy: <6.5 Hgb A1c  Reference: American Diabetes Association: Clinical Practice Recommendations 2010, Diabetes Care, 2010, 33: (Suppl  1).    CBG: Recent Labs  Lab 12/28/20 0204 12/28/20 0331  GLUCAP 91 99    Critical care time:    02/27/21, MS4

## 2020-12-28 NOTE — Progress Notes (Addendum)
ANTICOAGULATION CONSULT NOTE - Follow Up Consult  Pharmacy Consult for Heparin Indication: pulmonary embolus  No Known Allergies  Patient Measurements: Height: 5\' 11"  (180.3 cm) Weight: 66.1 kg (145 lb 11.6 oz) IBW/kg (Calculated) : 75.3 Heparin Dosing Weight: 66.1 kg   Vital Signs: Temp: 98.1 F (36.7 C) (09/07 0704) Temp Source: Oral (09/07 0704) BP: 85/61 (09/07 0630) Pulse Rate: 89 (09/07 0630)  Labs: Recent Labs    12/27/20 2050 12/28/20 0055 12/28/20 0129 12/28/20 0401  HGB 10.9* 11.3*  --   --   HCT 32.4* 35.0*  --   --   PLT 230 222  --   --   CREATININE  --  1.09  --   --   TROPONINIHS  --   --  61* 66*    Estimated Creatinine Clearance: 63.2 mL/min (by C-G formula based on SCr of 1.09 mg/dL).   Medications:  Scheduled:   Chlorhexidine Gluconate Cloth  6 each Topical Q0600   collagenase   Topical Daily   feeding supplement  237 mL Oral TID BM   folic acid  1 mg Oral Daily   Gerhardt's butt cream   Topical TID   multivitamin with minerals  1 tablet Oral Daily   OLANZapine  10 mg Oral QHS   sertraline  25 mg Oral Daily   thiamine  100 mg Oral Daily   Infusions:   ampicillin-sulbactam (UNASYN) IV Stopped (12/28/20 0351)   heparin 1,200 Units/hr (12/28/20 0600)   PRN: acetaminophen, acetaminophen, ondansetron **OR** ondansetron (ZOFRAN) IV  Assessment: 64 yo male admitted on 9/3 with AMS and aspiration pneumonia.  On 9/6, patient developed progressively increasing oxygen requirement, CTA chest revealed acute submassive PE with evidence of RHS.  Pharmacy consulted for heparin dosing.   Per IR, overall clot burden deemed small in volume with respiratory status likely due to near complete consolidation and collapse of the left lower lobe.  Not planning intervention.   Heparin level this morning subtherapeutic at 0.22, on 1200 units/hr.  No line issues or signs/symptoms of bleeding noted per RN.   Goal of Therapy:  Heparin level 0.3-0.7  units/ml Monitor platelets by anticoagulation protocol: Yes   Plan:  Give heparin 1000 units bolus x1, and increase heparin infusion to 1350 units/hr.  Check ~6hr heparin level.  Daily heparin level, CBC.  Monitor for signs/symptoms of bleeding.  F/u long-term anticoagulation plan.   11/6, PharmD PGY1 Pharmacy Resident Phone [phone] 12/28/2020 7:24 AM   Please check AMION for all [facility] Pharmacy phone numbers After 10:00 PM, call Main Pharmacy [phone]

## 2020-12-28 NOTE — Progress Notes (Signed)
Interventional Radiology (Brief Note)  Mr. Vogelsang, a 65 year old male, presented with SOB on 12/23/20.  Found to have subsegmental PE.  Has started on Heparin drip and has had hypotension overnight along with increased oxygen supplementation.  IR consulted for PE lysis vs. Thrombectomy.  Case reviewed by Dr. Grace Isaac who notes distal subsegmental scattered PE only.  No large clot burden.  Determined to not be a thrombectomy candidate and likely not amenable to lysis.  Discussed with Dr. Francine Graven and made aware no procedure planned in IR at this time.   Sheliah Plane, PA-C Interventional Radiology

## 2020-12-28 NOTE — Progress Notes (Addendum)
  Echocardiogram 2D Echocardiogram has been performed. Dr. Sharyn Lull called to read at 8:55 am.  Jarome Matin 12/28/2020, 8:55 AM

## 2020-12-28 NOTE — Progress Notes (Signed)
Nutrition Follow-up / Consult  DOCUMENTATION CODES:   Severe malnutrition in context of chronic illness  INTERVENTION:   Continue Dysphagia 3 diet with thin liquids   Continue Ensure Enlive po TID, each supplement provides 350 kcal and 20 grams of protein  Continue Magic cup TID with meals, each supplement provides 290 kcal and 9 grams of protein  Vitamin C, A levels pending  Continue MVI with minerals, folic acid, and thiamine   NUTRITION DIAGNOSIS:   Severe Malnutrition related to chronic illness (MS, ETOH) as evidenced by severe fat depletion, severe muscle depletion.  GOAL:   Patient will meet greater than or equal to 90% of their needs  MONITOR:   PO intake, Supplement acceptance  REASON FOR ASSESSMENT:   Consult Assessment of nutrition requirement/status, Poor PO  ASSESSMENT:   Pt with PMH of multiple sclerosis, ETOH abuse, bipolar disorder, GI bleed 2/2 gastric ulcer/esophagitis, tobacco abuse admitted after being found down by his roommate. Pt lying in his bed for 3 days covered in feces and urine but refusing care.    Discussed patient in ICU rounds and with RN today. Patient is confused. Starting Levophed for BP support. CTA chest 9/7 showed acute PE. Patient with collapse and consolidation of LLL.  Patient sleeping during RD visit. RN has not had a chance to feed patient breakfast this morning. Meal intakes documented at 0-15% since admission.  PT following for hydrotherapy of unstagable wound to R hip.   Medications reviewed and include: folic acid, MVI with minerals, thiamine   Labs reviewed. CBG: 99  Vitamin Profile: Vitamin A: pending Vitamin C: pending Zinc: 49 (low end of normal) Copper: 161 (H)   Patient weight 66.1 kg today Down from 72.6 kg (06/01/20) and 75.4 kg (05/05/20) 9-12% weight loss within 7-8 months is not severe, but is concerning.  Patient is severely malnourished as evidenced by severe depletion of muscle and subcutaneous  fat mass.    Diet Order:   Diet Order             DIET DYS 3 Room service appropriate? Yes with Assist; Fluid consistency: Thin  Diet effective now                   EDUCATION NEEDS:   Not appropriate for education at this time  Skin:  Skin Assessment: Skin Integrity Issues: Skin Integrity Issues:: Unstageable Unstageable: R hip  Last BM:  9/2  Height:   Ht Readings from Last 1 Encounters:  12/23/20 5\' 11"  (1.803 m)    Weight:   Wt Readings from Last 1 Encounters:  12/28/20 66.1 kg    BMI:  Body mass index is 20.32 kg/m.  Estimated Nutritional Needs:   Kcal:  2000-2200  Protein:  110-125 grams  Fluid:  > 2 L/day   02/27/21, RD, LDN, CNSC Please refer to Amion for contact information.

## 2020-12-28 NOTE — Progress Notes (Signed)
ANTICOAGULATION CONSULT NOTE - Follow Up Consult  Pharmacy Consult for Heparin Indication: pulmonary embolus  No Known Allergies  Patient Measurements: Height: 5\' 11"  (180.3 cm) Weight: 66.1 kg (145 lb 11.6 oz) IBW/kg (Calculated) : 75.3 Heparin Dosing Weight: 66.1 kg   Vital Signs: Temp: 97.6 F (36.4 C) (09/07 1519) Temp Source: Oral (09/07 1519) BP: 85/49 (09/07 1600) Pulse Rate: 79 (09/07 1600)  Labs: Recent Labs    12/27/20 2050 12/28/20 0055 12/28/20 0129 12/28/20 0401 12/28/20 0800 12/28/20 1602  HGB 10.9* 11.3*  --   --   --   --   HCT 32.4* 35.0*  --   --   --   --   PLT 230 222  --   --   --   --   HEPARINUNFRC  --   --   --   --  0.22* 0.21*  CREATININE  --  1.09  --   --   --   --   TROPONINIHS  --   --  61* 66*  --   --      Estimated Creatinine Clearance: 63.2 mL/min (by C-G formula based on SCr of 1.09 mg/dL).   Medications:  Scheduled:   Chlorhexidine Gluconate Cloth  6 each Topical Q0600   collagenase   Topical Daily   feeding supplement  237 mL Oral TID BM   folic acid  1 mg Oral Daily   Gerhardt's butt cream   Topical TID   ipratropium-albuterol  3 mL Nebulization Q6H   mouth rinse  15 mL Mouth Rinse BID   multivitamin with minerals  1 tablet Oral Daily   OLANZapine  10 mg Oral QHS   sertraline  25 mg Oral Daily   thiamine  100 mg Oral Daily   Infusions:   sodium chloride 250 mL (12/28/20 1248)   ampicillin-sulbactam (UNASYN) IV 3 g (12/28/20 1600)   [START ON 12/29/2020] doxycycline (VIBRAMYCIN) IV     heparin 1,350 Units/hr (12/28/20 1358)   norepinephrine (LEVOPHED) Adult infusion     PRN: acetaminophen, acetaminophen, fentaNYL (SUBLIMAZE) injection, ondansetron **OR** ondansetron (ZOFRAN) IV  Assessment: 65 yo male admitted on 9/3 with AMS and aspiration pneumonia.  On 9/6, patient developed progressively increasing oxygen requirement, CTA chest revealed acute submassive PE with evidence of RHS.  Pharmacy consulted for heparin  dosing.   Per IR, overall clot burden deemed small in volume with respiratory status likely due to near complete consolidation and collapse of the left lower lobe.  Not planning intervention.   Heparin level this morning subtherapeutic at 0.21, on 1350 units/hr.  No line issues or signs/symptoms of bleeding noted per RN.   Goal of Therapy:  Heparin level 0.3-0.7 units/ml Monitor platelets by anticoagulation protocol: Yes   Plan:  Increase IV heparin to 1500 units/hr. Check 8 hr heparin level.  Daily heparin level, CBC.  Monitor for signs/symptoms of bleeding.  F/u long-term anticoagulation plan.   11/6, Reece Leader, BCCP Clinical Pharmacist  12/28/2020 4:58 PM   [facility] pharmacy phone numbers are listed on [email or web address]

## 2020-12-28 NOTE — Progress Notes (Signed)
ANTICOAGULATION CONSULT NOTE - Initial Consult  Pharmacy Consult for heparin Indication: pulmonary embolus  No Known Allergies  Patient Measurements: Height: 5\' 11"  (180.3 cm) Weight: 72.6 kg (160 lb) IBW/kg (Calculated) : 75.3  Vital Signs: Temp: 100 F (37.8 C) (09/07 0007) Temp Source: Axillary (09/07 0007) BP: 111/72 (09/07 0007) Pulse Rate: 96 (09/07 0007)  Labs: Recent Labs    12/27/20 2050  HGB 10.9*  HCT 32.4*  PLT 230    Estimated Creatinine Clearance: 79.6 mL/min (by C-G formula based on SCr of 0.95 mg/dL).   Medical History: Past Medical History:  Diagnosis Date   Depression    Mental disorder    Movement disorder    MS (multiple sclerosis) (HCC)    Multiple sclerosis, relapsing-remitting (HCC)    Neuromuscular disorder (HCC)    Vision abnormalities     Medications:  Medications Prior to Admission  Medication Sig Dispense Refill Last Dose   folic acid (FOLVITE) 1 MG tablet Take 1 tablet (1 mg total) by mouth daily. (Patient not taking: Reported on 12/24/2020) 90 tablet 3 Not Taking   OLANZapine (ZYPREXA) 10 MG tablet Take 1 tablet (10 mg total) by mouth at bedtime. (Patient not taking: Reported on 12/24/2020) 90 tablet 3 Not Taking   sertraline (ZOLOFT) 50 MG tablet Take 1 tablet (50 mg total) by mouth daily. (Patient not taking: Reported on 12/24/2020) 90 tablet 3 Not Taking   TECFIDERA 240 MG CPDR Take 1 capsule by mouth twice daily (Patient not taking: No sig reported) 60 capsule 11 Not Taking   traMADol (ULTRAM) 50 MG tablet Take 1 tablet (50 mg total) by mouth every 6 (six) hours as needed for up to 10 doses. (Patient not taking: No sig reported) 10 tablet 0 Not Taking   Vitamin D, Ergocalciferol, (DRISDOL) 1.25 MG (50000 UT) CAPS capsule Take 1 capsule (50,000 Units total) by mouth every 7 (seven) days. (Patient not taking: No sig reported) 13 capsule 4 Not Taking   Scheduled:   collagenase   Topical Daily   feeding supplement  237 mL Oral TID BM    folic acid  1 mg Oral Daily   Gerhardt's butt cream   Topical TID   multivitamin with minerals  1 tablet Oral Daily   OLANZapine  10 mg Oral QHS   sertraline  25 mg Oral Daily   thiamine  100 mg Oral Daily   Infusions:   ampicillin-sulbactam (UNASYN) IV 3 g (12/27/20 2058)    Assessment: 65yo male admitted 9/3 w/ encephalopathy, now w/ O2 sats dropping despite NRB/HFNC >> CT reveals PE w/ RHS, to begin heparin.  Goal of Therapy:  Heparin level 0.3-0.7 units/ml Monitor platelets by anticoagulation protocol: Yes   Plan:  Heparin 4000 units IV bolus x1 followed by infusion at 1200 units/hr and monitor heparin levels and CBC.  11/3, PharmD, BCPS  12/28/2020,1:12 AM

## 2020-12-28 NOTE — Consult Note (Signed)
NAME:  [name], MRN:  [id number], DOB:  1955/06/02, LOS: 3 ADMISSION DATE:  12/23/2020, CONSULTATION DATE:  12/28/20 REFERRING MD:  Chotiner, CHIEF COMPLAINT:  submassive PE  History of Present Illness:  Mr. [name] is a 65 y.o. M with PMH significant for MS, ETOH abuse, GIB (duodenal ulcer 2019) who was admitted on 12/24/20 after being found by his roommate laying in bed for possibly three days covered in feces.  He was admitted with aspiration pneumonia and treated with Unasyn.  On 9/6 he went from room air to 10L HFNC and then overnight sats dropped to 87-88% on 15L NRB and 15L HFNC with worsening mental status prompting CTA chest which read likely submassive PE with RV/LV ratio of 1.3, therefore PCCM consulted  Pertinent  Medical History   has a past medical history of Depression, Mental disorder, Movement disorder, MS (multiple sclerosis) (HCC), Multiple sclerosis, relapsing-remitting (HCC), Neuromuscular disorder (HCC), and Vision abnormalities.   Significant Hospital Events: Including procedures, antibiotic start and stop dates in addition to other pertinent events   9/3 Admit to hospitalists, unasyn initiated 9/7 Worsening respiratory and mental status with CTA chest finding of Positive for acute PE with CT evidence of right heart strain (RV/LV Ratio = 1.34) consistent with at least submassive (intermediate risk) PE.   Collapse and consolidation of the left lower lobe.  Interim History / Subjective:   Pt's BP and O2 sats improved at the time of my assessment, pt awake and nodding to questions  Objective   Blood pressure 111/72, pulse 96, temperature 100 F (37.8 C), temperature source Axillary, resp. rate (!) 32, height 5\' 11"  (1.803 m), weight 72.6 kg, SpO2 98 %.    FiO2 (%):  [40 %] 40 %   Intake/Output Summary (Last 24 hours) at 12/28/2020 0137 Last data filed at 12/28/2020 0103 Gross per 24 hour  Intake 777.33 ml  Output 975 ml  Net -197.67 ml   Filed Weights   12/23/20  2115  Weight: 72.6 kg   General:  thin, poorly nourished M, tachypneic but not in severe distress HEENT: MM pink/moist Neuro: awake, slightly nods to questions but not conversational, calm, no tremors CV: s1s2 rrr, no m/r/g PULM:  decreased air movement LLL GI: soft, bsx4 active  Extremities: warm/dry, no edema  Skin: no rashes or lesions  Resolved Hospital Problem list   Rhabdo  Assessment & Plan:    Acute Hypoxic Respiratory Failure secondary to aspiration PNA with LLL collapse and pulmonary embolism ABG 7.5, pCO2 29, pO2 47 on 15L HFNC and 15L non-rebreather Pt started on heparin gtt Blood culture with staph hominis, no sputum culture P: -Pt's sats and mental status had improved slightly on my assessment, though still requiring significant oxygen and at risk for intubation, so transfer to ICU -Pt's at high risk for decompensation from PE with PESI score 205 and RV/LV ratio 1.3.  Troponin and BNP are pending, consider TPA -Spoke with Dr. 2116 with IR overnight, appreciate recommendations PE appears sub-acute and non-occlusive, thinks that worsening respiratory failure more likely secondary to LLL collapse, no indication for intervention.  Given this,  and risk of TPA outweighed by risk of bleeding in my assessment -Continue Unasyn and HFNC, at risk for intubation -Would likely benefit from bronchoscopy -Chest PT   Acute Encephalopathy, ETOH abuse  POA Likely metabolic  History of ETOH abuse MR brain with no acute findings 9/3 P: -Monitor, improved with oxygen supplementation overnight -has not required any Ativan on CIWA  protocol -getting thiamine, MV, folic acid  History of MS, relapsing remitting  On Tecfidera P: -follow up with Neurology outpatient   R Hip wound POA P: -continue Santyl and hydrotherapy    Bipolar Disorder, hx depression and SI with failure to thrive, protein calorie malnutrition and failure to thrive POA P: -PT/OT recommending SNF  placement and adult protective services seeking court appointed guardian -consult dietician  Best Practice (right click and "Reselect all SmartList Selections" daily)   Diet/type: dysphagia diet (see orders) DVT prophylaxis: systemic heparin GI prophylaxis: N/A Lines: N/A Foley:  N/A Code Status:  full code Last date of multidisciplinary goals of care discussion []   Labs   CBC: Recent Labs  Lab 12/23/20 2137 12/27/20 2050 12/28/20 0055  WBC 7.1 9.4 9.6  NEUTROABS 5.7  --   --   HGB 11.8* 10.9* 11.3*  HCT 37.5* 32.4* 35.0*  MCV 97.4 91.5 93.3  PLT 241 230 222    Basic Metabolic Panel: Recent Labs  Lab 12/23/20 2137 12/24/20 0258 12/25/20 0024  NA 138 138 140  K 5.1 4.6 4.0  CL 107 109 110  CO2 19* 17* 20*  GLUCOSE 93 95 90  BUN 27* 27* 22  CREATININE 1.24 1.07 0.95  CALCIUM 9.0 9.0 8.7*  MG  --  2.1  --   PHOS  --  3.6  --    GFR: Estimated Creatinine Clearance: 79.6 mL/min (by C-G formula based on SCr of 0.95 mg/dL). Recent Labs  Lab 12/23/20 2137 12/27/20 2050 12/28/20 0055  WBC 7.1 9.4 9.6  LATICACIDVEN 1.8  --   --     Liver Function Tests: Recent Labs  Lab 12/23/20 2137 12/24/20 0258 12/25/20 0024  AST 74* 62* 43*  ALT 35 32 27  ALKPHOS 68 71 63  BILITOT 1.5* 1.5* 1.5*  PROT 6.5 6.6 5.7*  ALBUMIN 3.0* 3.0* 2.5*   No results for input(s): LIPASE, AMYLASE in the last 168 hours. Recent Labs  Lab 12/24/20 0428  AMMONIA 23    ABG    Component Value Date/Time   PHART 7.503 (H) 12/27/2020 2130   PCO2ART 29.2 (L) 12/27/2020 2130   PO2ART 47.9 (L) 12/27/2020 2130   HCO3 22.4 12/27/2020 2130   TCO2 22.8 10/30/2011 0540   ACIDBASEDEF 0.2 12/27/2020 2130   O2SAT 80.6 12/27/2020 2130     Coagulation Profile: Recent Labs  Lab 12/23/20 2137 12/24/20 0258  INR 1.0 1.0    Cardiac Enzymes: Recent Labs  Lab 12/23/20 2137  CKTOTAL 2,476*    HbA1C: Hgb A1c MFr Bld  Date/Time Value Ref Range Status  05/06/2009 06:05 AM  4.6 - 6.1  % Final   5.6 (NOTE) The ADA recommends the following therapeutic goal for glycemic control related to Hgb A1c measurement: Goal of therapy: <6.5 Hgb A1c  Reference: American Diabetes Association: Clinical Practice Recommendations 2010, Diabetes Care, 2010, 33: (Suppl  1).    CBG: No results for input(s): GLUCAP in the last 168 hours.  Review of Systems:   Unable to obtain secondary to mental status  Past Medical History:  He,  has a past medical history of Depression, Mental disorder, Movement disorder, MS (multiple sclerosis) (HCC), Multiple sclerosis, relapsing-remitting (HCC), Neuromuscular disorder (HCC), and Vision abnormalities.   Surgical History:   Past Surgical History:  Procedure Laterality Date   BIOPSY  10/17/2017   Procedure: BIOPSY;  Surgeon: 10/19/2017, MD;  Location: [facility];  Service: Gastroenterology;;   ESOPHAGOGASTRODUODENOSCOPY (EGD) WITH PROPOFOL N/A 10/17/2017  Procedure: ESOPHAGOGASTRODUODENOSCOPY (EGD) WITH PROPOFOL;  Surgeon: Kathi Der, MD;  Location: [facility];  Service: Gastroenterology;  Laterality: N/A;   IR ANGIOGRAM SELECTIVE EACH ADDITIONAL VESSEL  10/21/2017   IR ANGIOGRAM SELECTIVE EACH ADDITIONAL VESSEL  10/21/2017   IR ANGIOGRAM VISCERAL SELECTIVE  10/21/2017   IR EMBO ART  VEN HEMORR LYMPH EXTRAV  INC GUIDE ROADMAPPING  10/21/2017   IR US GUIDE VASC ACCESS RIGHT  10/21/2017   TONSILLECTOMY       Social History:   reports that he has been smoking cigarettes. He has a 36.00 pack-year smoking history. He has never used smokeless tobacco. He reports current alcohol use. He reports current drug use. Drug: Marijuana.   Family History:  His family history includes Heart disease in his mother; Stroke in his father.   Allergies No Known Allergies   Home Medications  Prior to Admission medications   Medication Sig Start Date End Date Taking? Authorizing Provider  folic acid (FOLVITE) 1 MG tablet Take 1 tablet (1 mg total) by mouth  daily. Patient not taking: Reported on 12/24/2020 09/16/18   Sater, Pearletha Furl, MD  OLANZapine (ZYPREXA) 10 MG tablet Take 1 tablet (10 mg total) by mouth at bedtime. Patient not taking: Reported on 12/24/2020 09/16/18   Asa Lente, MD  sertraline (ZOLOFT) 50 MG tablet Take 1 tablet (50 mg total) by mouth daily. Patient not taking: Reported on 12/24/2020 09/16/18   Asa Lente, MD  TECFIDERA 240 MG CPDR Take 1 capsule by mouth twice daily Patient not taking: No sig reported 12/31/19   Sater, Pearletha Furl, MD  traMADol (ULTRAM) 50 MG tablet Take 1 tablet (50 mg total) by mouth every 6 (six) hours as needed for up to 10 doses. Patient not taking: No sig reported 06/01/20   [place], MD  Vitamin D, Ergocalciferol, (DRISDOL) 1.25 MG (50000 UT) CAPS capsule Take 1 capsule (50,000 Units total) by mouth every 7 (seven) days. Patient not taking: No sig reported 09/18/18   Asa Lente, MD     Critical care time: 55 minutes    CRITICAL CARE Performed by: Darcella Gasman Kaneshia Cater   Total critical care time: 55 minutes  Critical care time was exclusive of separately billable procedures and treating other patients.  Critical care was necessary to treat or prevent imminent or life-threatening deterioration.  Critical care was time spent personally by me on the following activities: development of treatment plan with patient and/or surrogate as well as nursing, discussions with consultants, evaluation of patient's response to treatment, examination of patient, obtaining history from patient or surrogate, ordering and performing treatments and interventions, ordering and review of laboratory studies, ordering and review of radiographic studies, pulse oximetry and re-evaluation of patient's condition.  Darcella Gasman Nikaya Nasby, PA-C [facility] See Amion for pager If no response to pager , please call [phone] until 7pm After 7:00 pm call Elink  [phone]

## 2020-12-28 NOTE — Progress Notes (Signed)
PROGRESS NOTE    [name]   ZOX:[id number]  DOB: 1955/09/29  PCP: Rinaldo Cloud, MD    DOA: 12/23/2020 LOS: 3    Brief Narrative / Hospital Course to Date:   [name] is a 65 year old male with past medical history significant for multiple sclerosis, EtOH abuse, bipolar disorder, history of GI bleed 2/2 gastric ulcer/esophagitis, tobacco use disorder who presented to [facility] ED via EMS for confusion and being found down by his roommate for likely 3 days.  Roommate reports generalized weakness and confusion for several days.  Apparently the patient was laying in his bed for 3 days covered in feces and urine; however refusing any type of assistance.  Per EMS, patient living in disheveled/poor environment; APS report was filed.  Admitted for further evaluation and management.   Patient had an aspiration event and was started on Unasyn.  Due to increasing dyspnea and oxygen requirement, CTA chest was obtained and showed bilateral acute PE's.  Started on heparin and PCCM was consulted.    Assessment & Plan   Principal Problem:   Acute respiratory failure with hypoxia (HCC) Active Problems:   MS (multiple sclerosis) (HCC)   Alcohol abuse   Bipolar I disorder (HCC)   Elevated brain natriuretic peptide (BNP) level   Acute encephalopathy   Polysubstance abuse (HCC)   Decubitus ulcer of hip   Rhabdomyolysis   Protein-calorie malnutrition, severe   Acute pulmonary embolism (HCC)   Drug abuse, marijuana   Aspiration pneumonia (HCC)   Acute respiratory failure with hypoxia due to Acute bilateral pulmonary emboli -with imaging evidence of right heart strain. Aspiration pneumonia with left basilar consolidation on imaging. -- PCCM following, appreciate recommendations --Continue heparin drip --On Unasyn --Supplemental oxygen to maintain sats above 90%, wean as tolerated --Aspiration precautions  Acute metabolic encephalopathy -POA, improving Presented by EMS after being found  down by roommate x3 days.  Associated generalized weakness.  Initially no evidence of infection, afebrile no leukocytosis.  UDS was positive for THC, EtOH less than 10.  CT head with cerebral atrophy but no acute findings.  UA negative.  Ammonia level normal.  Lactate normal.  Negative for COVID-19 and influenza A/B.  Unclear etiology but possibly due to alcohol abuse vs hypoxia given above. --Delirium precautions:     -Lights and TV off, minimize interruptions at night    -Blinds open and lights on during day    -Glasses/hearing aid with patient    -Frequent reorientation    -PT/OT when able    -Avoid sedation medications/Beers list medications  Rhabdomyolysis, mild, POA -patient was found down for 3 days.  Presented with a right hip wound and elevated CK20 476. --Treated with IV fluids now off --Supportive care and follow renal function  Right hip wound, POA -wound noted to have eschar blistering.  Seen by wound care.  Previously discussed with general surgery, recommended Santyl and hydrotherapy. -- PT following for hydrotherapy --Continue Santyl daily  Abnormal blood culture staff epidermidis -grew in 2/4 bottles of blood cultures from admission.  Suspect this is contamination.  Initially was started on bank prior to [facility] ID result, since discontinued. Patient remains afebrile with no leukocytosis --Follow repeat blood cultures of 9/4: No growth to date --Monitor clinically  Alcohol use disorder -uncertain type or quantity of alcohol.  On admission patient reported that he drinks "3 packs/day" --Monitor on CIWA protocol --Continue multivitamin folate and thiamine --TOC consulted for subs abuse resources  Tobacco use disorder -reports he  smokes 3 packs/day.  Counseled extensively on need for smoking cessation. --Nicotine patch  History of multiple sclerosis, relapsing remitting type -follows with neurology Dr. Epimenio Foot.  Last seen 05/05/2020.  On Tecfidera since 2013; patient reports not  taking medications just prior to admission.  MRI brain with and without contrast showed advanced white matter disease that is progressed from 2019 image with no acute or subacute findings --Hold off on resuming Tecfidera since he has been noncompliant outpatient --Outpatient follow-up with neuro  History bipolar disorder History of depression with suicidal ideations -stable Home regimen appears to be sertraline 50 mg daily, olanzapine 10 mg at bedtime.  Recently noncompliant with any medications. --Resumed on sertraline 25 mg daily and olanzapine 10 mg nightly  Adult failure to thrive -patient presented via EMS after being found down times several days by roommate.  Per EMS report, house noted in deplorable unlivable conditions, patient with right hip wound and just shoveled, covered in urine and feces --APS report was filed and currently seeking court appointed guardianship --TOC following  Generalized weakness /physical debility /deconditioning  PT and OT recommend SNF placement.   TOC following.   Patient BMI: Body mass index is 20.32 kg/m.   DVT prophylaxis: SCDs Start: 12/24/20 7741   Diet:  Diet Orders (From admission, onward)     Start     Ordered   12/26/20 1349  DIET DYS 3 Room service appropriate? Yes with Assist; Fluid consistency: Thin  Diet effective now       Question Answer Comment  Room service appropriate? Yes with Assist   Fluid consistency: Thin      12/26/20 1348              Code Status: Full Code   Subjective 12/28/20    Patient seen in ICU this morning, awake and without signs of distress.  Chest PT was running at the time.  Patient has extremely soft vocal projection and was difficult to understand him.  He denied chest pain or feeling short of breath.  Currently on 8 L/min HFNC down from 15 L/min previously.  Disposition Plan & Communication   Status is: Inpatient  Remains inpatient appropriate because:Inpatient level of care appropriate due  to severity of illness  Dispo: The patient is from: Home              Anticipated d/c is to: SNF              Patient currently is not medically stable to d/c.   Difficult to place patient No     Consults, Procedures, Significant Events   Consultants:  PCCM  Procedures:  None  Antimicrobials:  Anti-infectives (From admission, onward)    Start     Dose/Rate Route Frequency Ordered Stop   12/29/20 1200  doxycycline (VIBRAMYCIN) 100 mg in sodium chloride 0.9 % 250 mL IVPB        100 mg 125 mL/hr over 120 Minutes Intravenous Every 12 hours 12/28/20 1349 01/02/21 1159   12/28/20 1215  azithromycin (ZITHROMAX) 500 mg in sodium chloride 0.9 % 250 mL IVPB  Status:  Discontinued        500 mg 250 mL/hr over 60 Minutes Intravenous Every 24 hours 12/28/20 1125 12/28/20 1349   12/27/20 0900  Ampicillin-Sulbactam (UNASYN) 3 g in sodium chloride 0.9 % 100 mL IVPB        3 g 200 mL/hr over 30 Minutes Intravenous Every 6 hours 12/27/20 0803     12/25/20 1600  vancomycin (VANCOREADY) IVPB 1250 mg/250 mL  Status:  Discontinued        1,250 mg 166.7 mL/hr over 90 Minutes Intravenous Every 24 hours 12/24/20 1544 12/25/20 0727   12/24/20 1630  vancomycin (VANCOREADY) IVPB 1500 mg/300 mL        1,500 mg 150 mL/hr over 120 Minutes Intravenous  Once 12/24/20 1544 12/24/20 1859         Micro    Objective   Vitals:   12/28/20 1100 12/28/20 1130 12/28/20 1200 12/28/20 1300  BP: 97/68  98/76 102/67  Pulse: 84  78 91  Resp: (!) 28  (!) 27 (!) 25  Temp: 97.7 F (36.5 C)     TempSrc: Oral     SpO2: 97% 96% 95% 96%  Weight:      Height:        Intake/Output Summary (Last 24 hours) at 12/28/2020 1357 Last data filed at 12/28/2020 1300 Gross per 24 hour  Intake 767.44 ml  Output 1625 ml  Net -857.56 ml   Filed Weights   12/23/20 2115 12/28/20 0205  Weight: 72.6 kg 66.1 kg    Physical Exam:  General exam: awake, alert, no acute distress, chronically ill-appearing,  underweight HEENT: High flow nasal cannula in place, moist mucus membranes, hearing grossly normal  Respiratory system: Exam limited by chest PT running, normal respiratory effort, on 8 L/min HFNC oxygen. Cardiovascular system: normal S1/S2, RRR, no pedal edema.   Gastrointestinal system: soft, NT, ND Central nervous system: no gross focal neurologic deficits, normal but very weak vocal projection Extremities: Upper extremity soft restraints in place, no edema, normal tone, right lateral hip dressing clean dry intact with no signs of surrounding erythema Psychiatry: normal mood, congruent affect, unable to assess judgment and insight at this time  Labs   Data Reviewed: I have personally reviewed following labs and imaging studies  CBC: Recent Labs  Lab 12/23/20 2137 12/27/20 2050 12/28/20 0055  WBC 7.1 9.4 9.6  NEUTROABS 5.7  --   --   HGB 11.8* 10.9* 11.3*  HCT 37.5* 32.4* 35.0*  MCV 97.4 91.5 93.3  PLT 241 230 222   Basic Metabolic Panel: Recent Labs  Lab 12/23/20 2137 12/24/20 0258 12/25/20 0024 12/28/20 0055  NA 138 138 140 142  K 5.1 4.6 4.0 4.0  CL 107 109 110 111  CO2 19* 17* 20* 23  GLUCOSE 93 95 90 96  BUN 27* 27* 22 17  CREATININE 1.24 1.07 0.95 1.09  CALCIUM 9.0 9.0 8.7* 8.2*  MG  --  2.1  --  2.0  PHOS  --  3.6  --   --    GFR: Estimated Creatinine Clearance: 63.2 mL/min (by C-G formula based on SCr of 1.09 mg/dL). Liver Function Tests: Recent Labs  Lab 12/23/20 2137 12/24/20 0258 12/25/20 0024 12/28/20 0055  AST 74* 62* 43* 62*  ALT 35 32 27 33  ALKPHOS 68 71 63 80  BILITOT 1.5* 1.5* 1.5* 1.5*  PROT 6.5 6.6 5.7* 5.4*  ALBUMIN 3.0* 3.0* 2.5* 2.2*   No results for input(s): LIPASE, AMYLASE in the last 168 hours. Recent Labs  Lab 12/24/20 0428  AMMONIA 23   Coagulation Profile: Recent Labs  Lab 12/23/20 2137 12/24/20 0258  INR 1.0 1.0   Cardiac Enzymes: Recent Labs  Lab 12/23/20 2137  CKTOTAL 2,476*   BNP (last 3 results) No  results for input(s): PROBNP in the last 8760 hours. HbA1C: No results for input(s): HGBA1C in the last  72 hours. CBG: Recent Labs  Lab 12/28/20 0204 12/28/20 0331  GLUCAP 91 99   Lipid Profile: No results for input(s): CHOL, HDL, LDLCALC, TRIG, CHOLHDL, LDLDIRECT in the last 72 hours. Thyroid Function Tests: No results for input(s): TSH, T4TOTAL, FREET4, T3FREE, THYROIDAB in the last 72 hours. Anemia Panel: No results for input(s): VITAMINB12, FOLATE, FERRITIN, TIBC, IRON, RETICCTPCT in the last 72 hours. Sepsis Labs: Recent Labs  Lab 12/23/20 2137 12/28/20 0434  LATICACIDVEN 1.8 1.4    Recent Results (from the past 240 hour(s))  Urine Culture     Status: None   Collection Time: 12/23/20  9:38 PM   Specimen: In/Out Cath Urine  Result Value Ref Range Status   Specimen Description IN/OUT CATH URINE  Final   Special Requests NONE  Final   Culture   Final    NO GROWTH Performed at [facility], [address] [place], [place] [address]    Report Status 12/25/2020 FINAL  Final  Culture, blood (Routine x 2)     Status: Abnormal   Collection Time: 12/23/20 10:15 PM   Specimen: BLOOD  Result Value Ref Range Status   Specimen Description BLOOD LEFT ANTECUBITAL  Final   Special Requests   Final    BOTTLES DRAWN AEROBIC AND ANAEROBIC Blood Culture adequate volume   Culture  Setup Time   Final    GRAM POSITIVE COCCI IN BOTH AEROBIC AND ANAEROBIC BOTTLES CRITICAL RESULT CALLED TO, READ BACK BY AND VERIFIED WITH: L CURRAN PHARMD @1706  12/24/20 EB    Culture (A)  Final    STAPHYLOCOCCUS HOMINIS STAPHYLOCOCCUS SIMULANS STAPHYLOCOCCUS EPIDERMIDIS THE SIGNIFICANCE OF ISOLATING THIS ORGANISM FROM A SINGLE SET OF BLOOD CULTURES WHEN MULTIPLE SETS ARE DRAWN IS UNCERTAIN. PLEASE NOTIFY THE MICROBIOLOGY DEPARTMENT WITHIN ONE WEEK IF SPECIATION AND SENSITIVITIES ARE REQUIRED. Performed at [facility], [address], [place], [place] [address]    Report Status 12/26/2020  FINAL  Final  Blood Culture ID Panel (Reflexed)     Status: Abnormal   Collection Time: 12/23/20 10:15 PM  Result Value Ref Range Status   Enterococcus faecalis NOT DETECTED NOT DETECTED Final   Enterococcus Faecium NOT DETECTED NOT DETECTED Final   Listeria monocytogenes NOT DETECTED NOT DETECTED Final   Staphylococcus species DETECTED (A) NOT DETECTED Final    Comment: CRITICAL RESULT CALLED TO, READ BACK BY AND VERIFIED WITH: L CURRAN PHARMD @1706  12/24/20 EB    Staphylococcus aureus (BCID) NOT DETECTED NOT DETECTED Final   Staphylococcus epidermidis DETECTED (A) NOT DETECTED Final    Comment: CRITICAL RESULT CALLED TO, READ BACK BY AND VERIFIED WITH: L CURRAN PHARMD @1706  12/24/20 EB    Staphylococcus lugdunensis NOT DETECTED NOT DETECTED Final   Streptococcus species NOT DETECTED NOT DETECTED Final   Streptococcus agalactiae NOT DETECTED NOT DETECTED Final   Streptococcus pneumoniae NOT DETECTED NOT DETECTED Final   Streptococcus pyogenes NOT DETECTED NOT DETECTED Final   A.calcoaceticus-baumannii NOT DETECTED NOT DETECTED Final   Bacteroides fragilis NOT DETECTED NOT DETECTED Final   Enterobacterales NOT DETECTED NOT DETECTED Final   Enterobacter cloacae complex NOT DETECTED NOT DETECTED Final   Escherichia coli NOT DETECTED NOT DETECTED Final   Klebsiella aerogenes NOT DETECTED NOT DETECTED Final   Klebsiella oxytoca NOT DETECTED NOT DETECTED Final   Klebsiella pneumoniae NOT DETECTED NOT DETECTED Final   Proteus species NOT DETECTED NOT DETECTED Final   Salmonella species NOT DETECTED NOT DETECTED Final   Serratia marcescens NOT DETECTED NOT  DETECTED Final   Haemophilus influenzae NOT DETECTED NOT DETECTED Final   Neisseria meningitidis NOT DETECTED NOT DETECTED Final   Pseudomonas aeruginosa NOT DETECTED NOT DETECTED Final   Stenotrophomonas maltophilia NOT DETECTED NOT DETECTED Final   Candida albicans NOT DETECTED NOT DETECTED Final   Candida auris NOT DETECTED NOT  DETECTED Final   Candida glabrata NOT DETECTED NOT DETECTED Final   Candida krusei NOT DETECTED NOT DETECTED Final   Candida parapsilosis NOT DETECTED NOT DETECTED Final   Candida tropicalis NOT DETECTED NOT DETECTED Final   Cryptococcus neoformans/gattii NOT DETECTED NOT DETECTED Final   Methicillin resistance mecA/C NOT DETECTED NOT DETECTED Final    Comment: Performed at [facility], [address] [place], [place] [address]  Culture, blood (Routine x 2)     Status: None (Preliminary result)   Collection Time: 12/23/20 10:20 PM   Specimen: BLOOD RIGHT HAND  Result Value Ref Range Status   Specimen Description BLOOD RIGHT HAND  Final   Special Requests   Final    BOTTLES DRAWN AEROBIC AND ANAEROBIC Blood Culture adequate volume   Culture   Final    NO GROWTH 4 DAYS Performed at [facility], [address] [place], [place] [address]    Report Status PENDING  Incomplete  Resp Panel by RT-PCR (Flu A&B, Covid) Nasopharyngeal Swab     Status: None   Collection Time: 12/23/20 10:30 PM   Specimen: Nasopharyngeal Swab; Nasopharyngeal(NP) swabs in vial transport medium  Result Value Ref Range Status   SARS Coronavirus 2 by RT PCR NEGATIVE NEGATIVE Final    Comment: (NOTE) SARS-CoV-2 target nucleic acids are NOT DETECTED.  The SARS-CoV-2 RNA is generally detectable in upper respiratory specimens during the acute phase of infection. The lowest concentration of SARS-CoV-2 viral copies this assay can detect is 138 copies/mL. A negative result does not preclude SARS-Cov-2 infection and should not be used as the sole basis for treatment or other patient management decisions. A negative result may occur with  improper specimen collection/handling, submission of specimen other than nasopharyngeal swab, presence of viral mutation(s) within the areas targeted by this assay, and inadequate number of viral copies(<138 copies/mL). A negative result must be combined with clinical  observations, patient history, and epidemiological information. The expected result is Negative.  Fact Sheet for Patients:  BloggerCourse.com  Fact Sheet for Healthcare Providers:  SeriousBroker.it  This test is no t yet approved or cleared by the [place] FDA and  has been authorized for detection and/or diagnosis of SARS-CoV-2 by FDA under an Emergency Use Authorization (EUA). This EUA will remain  in effect (meaning this test can be used) for the duration of the COVID-19 declaration under Section 564(b)(1) of the Act, 21 U.S.C.section 360bbb-3(b)(1), unless the authorization is terminated  or revoked sooner.       Influenza A by PCR NEGATIVE NEGATIVE Final   Influenza B by PCR NEGATIVE NEGATIVE Final    Comment: (NOTE) The Xpert Xpress SARS-CoV-2/FLU/RSV plus assay is intended as an aid in the diagnosis of influenza from Nasopharyngeal swab specimens and should not be used as a sole basis for treatment. Nasal washings and aspirates are unacceptable for Xpert Xpress SARS-CoV-2/FLU/RSV testing.  Fact Sheet for Patients: BloggerCourse.com  Fact Sheet for Healthcare Providers: SeriousBroker.it  This test is not yet approved or cleared by the [place] FDA and has been authorized for detection and/or diagnosis of SARS-CoV-2 by FDA under an Emergency Use Authorization (EUA). This EUA  will remain in effect (meaning this test can be used) for the duration of the COVID-19 declaration under Section 564(b)(1) of the Act, 21 U.S.C. section 360bbb-3(b)(1), unless the authorization is terminated or revoked.  Performed at [facility], [address], [place], [place] [address]   Culture, blood (routine x 2)     Status: None (Preliminary result)   Collection Time: 12/25/20  8:06 AM   Specimen: BLOOD  Result Value Ref Range Status   Specimen Description BLOOD BLOOD  RIGHT HAND  Final   Special Requests   Final    BOTTLES DRAWN AEROBIC AND ANAEROBIC Blood Culture adequate volume   Culture   Final    NO GROWTH 3 DAYS Performed at [facility], [address] [place], [place] [address]    Report Status PENDING  Incomplete  Culture, blood (routine x 2)     Status: None (Preliminary result)   Collection Time: 12/25/20  8:07 AM   Specimen: BLOOD  Result Value Ref Range Status   Specimen Description BLOOD BLOOD RIGHT HAND  Final   Special Requests   Final    BOTTLES DRAWN AEROBIC AND ANAEROBIC Blood Culture adequate volume   Culture   Final    NO GROWTH 3 DAYS Performed at [facility], [address], [place], [place] [address]    Report Status PENDING  Incomplete  MRSA Next Gen by PCR, Nasal     Status: None   Collection Time: 12/28/20  2:05 AM   Specimen: Nasal Mucosa; Nasal Swab  Result Value Ref Range Status   MRSA by PCR Next Gen NOT DETECTED NOT DETECTED Final    Comment: (NOTE) The GeneXpert MRSA Assay (FDA approved for NASAL specimens only), is one component of a comprehensive MRSA colonization surveillance program. It is not intended to diagnose MRSA infection nor to guide or monitor treatment for MRSA infections. Test performance is not FDA approved in patients less than 68 years old. Performed at [facility], [address] [place], [place] [address]       Imaging Studies   CT Angio Chest Pulmonary Embolism (PE) W or WO Contrast  Result Date: 12/28/2020 CLINICAL DATA:  PE suspected, high prob EXAM: CT ANGIOGRAPHY CHEST WITH CONTRAST TECHNIQUE: Multidetector CT imaging of the chest was performed using the standard protocol during bolus administration of intravenous contrast. Multiplanar CT image reconstructions and MIPs were obtained to evaluate the vascular anatomy. CONTRAST:  71mL OMNIPAQUE IOHEXOL 350 MG/ML SOLN COMPARISON:  None. FINDINGS: Cardiovascular: There is adequate opacification of the pulmonary  arterial tree. There are branching intraluminal filling defects identified within the lobar pulmonary arteries bilaterally as well as several intraluminal filling defects within multiple segmental pulmonary arteries of the lower lobes bilaterally in keeping with acute pulmonary embolism. The central pulmonary arteries are of normal caliber. There is CT evidence of right heart strain, however, with an RV/LV ratio of 1.34. Moderate multi-vessel coronary artery calcification. Global cardiac size within normal limits. No pericardial effusion. Mild atherosclerotic calcification noted within the thoracic aorta. No aortic aneurysm. Mediastinum/Nodes: No enlarged mediastinal, hilar, or axillary lymph nodes. Thyroid gland, trachea, and esophagus demonstrate no significant findings. Small hiatal hernia. Lungs/Pleura: There is collapse and consolidation of the left lower lobe. Scattered nonspecific infiltrate within the a left upper lobe. The right lung is clear. No pneumothorax or pleural effusion. No central obstructing lesion. There is mild bronchial wall thickening noted centrally in keeping with mild airway inflammation. Upper  Abdomen: No acute abnormality. Musculoskeletal: No acute bone abnormality. Review of the MIP images confirms the above findings. IMPRESSION: Positive for acute PE with CT evidence of right heart strain (RV/LV Ratio = 1.34) consistent with at least submassive (intermediate risk) PE. The presence of right heart strain has been associated with an increased risk of morbidity and mortality. Please refer to the "PE Focused" order set in EPIC. Collapse and consolidation of the left lower lobe. Bronchial wall thickening in keeping with airway inflammation. Moderate multi-vessel coronary artery calcification. Aortic Atherosclerosis (ICD10-I70.0). Attempts are being made at this time to contact the managing clinician for direct verbal communication of these findings. Electronically Signed   By: Helyn NumbersAshesh  Parikh  M.D.   On: 12/28/2020 00:19   DG CHEST PORT 1 VIEW  Result Date: 12/27/2020 CLINICAL DATA:  Shortness of breath.  Failure to thrive. EXAM: PORTABLE CHEST 1 VIEW COMPARISON:  12/23/2020; 06/01/2020 FINDINGS: The examination is degraded due to portable technique and patient positioning. Grossly unchanged borderline enlarged cardiac silhouette and mediastinal contours given reduced lung volumes and patient rotation. Atherosclerotic plaque within the thoracic aorta. Interval development of left basilar heterogeneous/consolidative opacities. Mild pulmonary venous congestion without frank evidence of edema. Suspected trace left-sided effusion. No pneumothorax. No acute osseous abnormalities. IMPRESSION: 1. Interval development of left basilar heterogeneous/consolidative opacities - atelectasis versus infiltrate/aspiration. Further evaluation with a PA and lateral chest radiograph may be obtained as clinically indicated. 2. Similar findings of cardiomegaly and pulmonary venous congestion without frank evidence of edema. Electronically Signed   By: Simonne ComeJohn  Watts M.D.   On: 12/27/2020 08:24   ECHOCARDIOGRAM LIMITED  Result Date: 12/28/2020    ECHOCARDIOGRAM LIMITED REPORT   Patient Name:   Gilford RaidJOHN E Shambley Date of Exam: 12/28/2020 Medical Rec #:  [id number]    Height:       71.0 in Accession #:    [id number]   Weight:       145.7 lb Date of Birth:  April 12, 1956    BSA:          1.843 m Patient Age:    65 years     BP:           89/57 mmHg Patient Gender: M            HR:           86 bpm. Exam Location:  Inpatient Procedure: 3D Echo, Limited Echo, Limited Color Doppler and Cardiac Doppler STAT ECHO Indications:     Pulmonary Embolus I26.09  History:         Patient has no prior history of Echocardiogram examinations.                  Risk Factors:Current Smoker. Acute metabolic encephalopathy.                  Polysubstance abuse. Rhabdomyolysis.  Sonographer:     Leta Junglingiffany Cooper RDCS Referring Phys:  [id number] Martina SinnerJONATHAN B DEWALD  Diagnosing Phys: Rinaldo CloudMohan Harwani MD IMPRESSIONS  1. Left ventricular ejection fraction, by estimation, is 50 to 55%. The left ventricle has normal function. The left ventricle has no regional wall motion abnormalities. Left ventricular diastolic parameters are consistent with Grade I diastolic dysfunction (impaired relaxation).  2. Right ventricular systolic function is mildly reduced. The right ventricular size is mildly enlarged. There is moderately elevated pulmonary artery systolic pressure.  3. The mitral valve is normal in structure. Trivial mitral valve regurgitation.  4. Cannot rule out vegetation . TEE RECOMMENDED  IF CLINICALLY INDICATED.. Tricuspid valve regurgitation is mild.  5. The aortic valve is normal in structure. Aortic valve regurgitation is not visualized. No aortic stenosis is present.  6. The inferior vena cava is normal in size with greater than 50% respiratory variability, suggesting right atrial pressure of 3 mmHg. FINDINGS  Left Ventricle: Left ventricular ejection fraction, by estimation, is 50 to 55%. The left ventricle has normal function. The left ventricle has no regional wall motion abnormalities. The left ventricular internal cavity size was normal in size. There is  no left ventricular hypertrophy. Left ventricular diastolic parameters are consistent with Grade I diastolic dysfunction (impaired relaxation). Right Ventricle: The right ventricular size is mildly enlarged. Right ventricular systolic function is mildly reduced. There is moderately elevated pulmonary artery systolic pressure. The tricuspid regurgitant velocity is 3.38 m/s, and with an assumed right atrial pressure of 3 mmHg, the estimated right ventricular systolic pressure is 48.7 mmHg. Left Atrium: Left atrial size was normal in size. Right Atrium: Right atrial size was normal in size. Pericardium: There is no evidence of pericardial effusion. Mitral Valve: The mitral valve is normal in structure. Trivial mitral valve  regurgitation. Tricuspid Valve: Cannot rule out vegetation . TEE RECOMMENDED IF CLINICALLY INDICATED. Tricuspid valve regurgitation is mild. Aortic Valve: The aortic valve is normal in structure. Aortic valve regurgitation is not visualized. No aortic stenosis is present. Pulmonic Valve: The pulmonic valve was normal in structure. Pulmonic valve regurgitation is trivial. Aorta: The aortic root is normal in size and structure. Venous: The inferior vena cava is normal in size with greater than 50% respiratory variability, suggesting right atrial pressure of 3 mmHg. LEFT VENTRICLE PLAX 2D LVIDd:         4.30 cm     Diastology LVIDs:         3.10 cm     LV e' medial:    5.98 cm/s LV PW:         0.80 cm     LV E/e' medial:  7.5 LV IVS:        0.90 cm     LV e' lateral:   7.51 cm/s LVOT diam:     2.30 cm     LV E/e' lateral: 5.9 LV SV:         47 LV SV Index:   26 LVOT Area:     4.15 cm  LV Volumes (MOD) LV vol d, MOD A2C: 72.4 ml LV vol d, MOD A4C: 87.1 ml LV vol s, MOD A2C: 47.4 ml LV vol s, MOD A4C: 40.5 ml LV SV MOD A2C:     25.0 ml LV SV MOD A4C:     87.1 ml LV SV MOD BP:      34.9 ml RIGHT VENTRICLE RV S prime:     11.50 cm/s TAPSE (M-mode): 1.5 cm LEFT ATRIUM         Index      RIGHT ATRIUM           Index LA diam:    2.80 cm 1.52 cm/m RA Area:     12.40 cm                                RA Volume:   32.50 ml  17.63 ml/m  AORTIC VALVE LVOT Vmax:   64.30 cm/s LVOT Vmean:  39.800 cm/s LVOT VTI:    0.114 m  AORTA Ao Asc diam:  3.00 cm MITRAL VALVE               TRICUSPID VALVE MV Area (PHT): 4.57 cm    TR Peak grad:   45.7 mmHg MV Decel Time: 166 msec    TR Vmax:        338.00 cm/s MV E velocity: 44.60 cm/s MV A velocity: 61.70 cm/s  SHUNTS MV E/A ratio:  0.72        Systemic VTI:  0.11 m                            Systemic Diam: 2.30 cm Rinaldo Cloud MD Electronically signed by Rinaldo Cloud MD Signature Date/Time: 12/28/2020/11:14:03 AM    Final      Medications   Scheduled Meds:  Chlorhexidine Gluconate  Cloth  6 each Topical Q0600   collagenase   Topical Daily   feeding supplement  237 mL Oral TID BM   folic acid  1 mg Oral Daily   Gerhardt's butt cream   Topical TID   ipratropium-albuterol  3 mL Nebulization Q6H   mouth rinse  15 mL Mouth Rinse BID   multivitamin with minerals  1 tablet Oral Daily   OLANZapine  10 mg Oral QHS   sertraline  25 mg Oral Daily   thiamine  100 mg Oral Daily   Continuous Infusions:  sodium chloride 250 mL (12/28/20 1248)   ampicillin-sulbactam (UNASYN) IV 3 g (12/28/20 0930)   [START ON 12/29/2020] doxycycline (VIBRAMYCIN) IV     heparin 1,350 Units/hr (12/28/20 0924)   norepinephrine (LEVOPHED) Adult infusion         LOS: 3 days    Time spent: 30 minutes    Pennie Banter, DO [facility]  12/28/2020, 1:57 PM      If 7PM-7AM, please contact night-coverage. How to contact the [facility] Attending or Consulting provider 7A - 7P or covering provider during after hours 7P -7A, for this patient?    Check the care team in [facility] and look for a) attending/consulting [facility] provider listed and b) the [facility] team listed Log into [email or web address] and use [place]'s universal password to access. If you do not have the password, please contact the hospital operator. Locate the [facility] provider you are looking for under [facility] and page to a number that you can be directly reached. If you still have difficulty reaching the provider, please page the [facility] (Director on Call) for the Hospitalists listed on amion for assistance.

## 2020-12-29 ENCOUNTER — Inpatient Hospital Stay (HOSPITAL_COMMUNITY): Payer: Medicare Other

## 2020-12-29 DIAGNOSIS — J9601 Acute respiratory failure with hypoxia: Secondary | ICD-10-CM | POA: Diagnosis not present

## 2020-12-29 DIAGNOSIS — J9600 Acute respiratory failure, unspecified whether with hypoxia or hypercapnia: Secondary | ICD-10-CM

## 2020-12-29 DIAGNOSIS — I2699 Other pulmonary embolism without acute cor pulmonale: Secondary | ICD-10-CM

## 2020-12-29 LAB — HEPARIN LEVEL (UNFRACTIONATED)
Heparin Unfractionated: 0.31 IU/mL (ref 0.30–0.70)
Heparin Unfractionated: 0.36 IU/mL (ref 0.30–0.70)

## 2020-12-29 LAB — CULTURE, BLOOD (ROUTINE X 2)
Culture: NO GROWTH
Special Requests: ADEQUATE

## 2020-12-29 LAB — VITAMIN B12: Vitamin B-12: 476 pg/mL (ref 180–914)

## 2020-12-29 LAB — CBC
HCT: 30.4 % — ABNORMAL LOW (ref 39.0–52.0)
Hemoglobin: 9.9 g/dL — ABNORMAL LOW (ref 13.0–17.0)
MCH: 30.3 pg (ref 26.0–34.0)
MCHC: 32.6 g/dL (ref 30.0–36.0)
MCV: 93 fL (ref 80.0–100.0)
Platelets: 241 10*3/uL (ref 150–400)
RBC: 3.27 MIL/uL — ABNORMAL LOW (ref 4.22–5.81)
RDW: 14 % (ref 11.5–15.5)
WBC: 10.3 10*3/uL (ref 4.0–10.5)
nRBC: 0 % (ref 0.0–0.2)

## 2020-12-29 LAB — PHOSPHORUS: Phosphorus: 3.4 mg/dL (ref 2.5–4.6)

## 2020-12-29 LAB — VITAMIN A: Vitamin A (Retinoic Acid): 8 ug/dL — ABNORMAL LOW (ref 22.0–69.5)

## 2020-12-29 LAB — BASIC METABOLIC PANEL
Anion gap: 9 (ref 5–15)
BUN: 18 mg/dL (ref 8–23)
CO2: 22 mmol/L (ref 22–32)
Calcium: 8.2 mg/dL — ABNORMAL LOW (ref 8.9–10.3)
Chloride: 113 mmol/L — ABNORMAL HIGH (ref 98–111)
Creatinine, Ser: 1.01 mg/dL (ref 0.61–1.24)
GFR, Estimated: >60 mL/min (ref >60)
Glucose, Bld: 118 mg/dL — ABNORMAL HIGH (ref 70–99)
Potassium: 3.8 mmol/L (ref 3.5–5.1)
Sodium: 144 mmol/L (ref 135–145)

## 2020-12-29 LAB — LEGIONELLA PNEUMOPHILA SEROGP 1 UR AG: L. pneumophila Serogp 1 Ur Ag: NEGATIVE

## 2020-12-29 LAB — VITAMIN C: Vitamin C: <0.1 mg/dL — ABNORMAL LOW (ref 0.4–2.0)

## 2020-12-29 LAB — FOLATE: Folate: 13.6 ng/mL (ref >5.9)

## 2020-12-29 LAB — TSH: TSH: 1.618 u[IU]/mL (ref 0.350–4.500)

## 2020-12-29 LAB — CORTISOL: Cortisol, Plasma: 20.4 ug/dL

## 2020-12-29 LAB — MAGNESIUM: Magnesium: 2.2 mg/dL (ref 1.7–2.4)

## 2020-12-29 MED ORDER — LACTATED RINGERS IV BOLUS
1000.0000 mL | Freq: Once | INTRAVENOUS | Status: AC
  Administered 2020-12-29: 1000 mL via INTRAVENOUS

## 2020-12-29 MED ORDER — IPRATROPIUM-ALBUTEROL 0.5-2.5 (3) MG/3ML IN SOLN
3.0000 mL | Freq: Two times a day (BID) | RESPIRATORY_TRACT | Status: DC
  Administered 2020-12-29 – 2021-01-04 (×12): 3 mL via RESPIRATORY_TRACT
  Filled 2020-12-29 (×12): qty 3

## 2020-12-29 MED ORDER — POLYETHYLENE GLYCOL 3350 17 G PO PACK
17.0000 g | PACK | Freq: Every day | ORAL | Status: DC
  Administered 2020-12-30: 17 g via ORAL
  Filled 2020-12-29: qty 1

## 2020-12-29 NOTE — Progress Notes (Signed)
NAME:  [name], MRN:  [id number], DOB:  1955-08-11, LOS: 4 ADMISSION DATE:  12/23/2020, CONSULTATION DATE:  12/29/20 REFERRING MD:  Chotiner, CHIEF COMPLAINT:  submassive PE  History of Present Illness:  Mr. [name] is a 65 y.o. M with PMH significant for MS, ETOH abuse, GIB (duodenal ulcer 2019) who was admitted on 12/24/20 after being found by his roommate laying in bed for possibly three days covered in feces.  He was admitted with aspiration pneumonia and treated with Unasyn.  On 9/6 he went from room air to 10L HFNC and then overnight sats dropped to 87-88% on 15L NRB and 15L HFNC with worsening mental status prompting CTA chest which read likely submassive PE with RV/LV ratio of 1.3, therefore PCCM consulted.  Pertinent  Medical History   has a past medical history of Depression, Mental disorder, Movement disorder, MS (multiple sclerosis) (HCC), Multiple sclerosis, relapsing-remitting (HCC), Neuromuscular disorder (HCC), and Vision abnormalities.   Significant Hospital Events: Including procedures, antibiotic start and stop dates in addition to other pertinent events   9/3 Admit to hospitalists, unasyn initiated 9/7 Worsening respiratory and mental status with CTA chest finding of Positive for acute PE with CT evidence of right heart strain (RV/LV Ratio = 1.34) consistent with at least submassive (intermediate risk) PE.   Collapse and consolidation of the left lower lobe. Per IR, no plans for procedure  Interim History / Subjective:  Patient oxygen requirement continuing to improve (now on 7 L HFNC). Patient needed a few hours of levophed overnight but is off this morning. Difficult to understand but follows commands  Objective   Blood pressure 101/60, pulse 87, temperature 98.5 F (36.9 C), temperature source Axillary, resp. rate (!) 23, height 5\' 11"  (1.803 m), weight 66.1 kg, SpO2 96 %.        Intake/Output Summary (Last 24 hours) at 12/29/2020 02/28/2021 Last data filed at 12/29/2020  0600 Gross per 24 hour  Intake 1196.13 ml  Output 550 ml  Net 646.13 ml   Filed Weights   12/23/20 2115 12/28/20 0205  Weight: 72.6 kg 66.1 kg   General:  thin, poorly nourished M, tachypneic but not in severe distress, sleeping but arouses to name HEENT: MM pink/moist Neuro: sleeping but arouses easily, slightly nods to questions, attempts to talk but difficult to understand, calm, no tremors, sluggish but follows commands CV: s1s2 rrr, no m/r/g PULM:  decreased air movement LLL  Extremities: warm/dry, no edema  Skin: no rashes or lesions  Resolved Hospital Problem list   Rhabdo  Assessment & Plan:   Acute Hypoxic Respiratory Failure secondary to suspected aspiration PNA with LLL collapse and pulmonary embolism Shock -Oxygen requirement improving but patient is still tachypneic with significant oxygen requirement and therefore still at risk for needing intubation.  - Continue HFNC, wean to O2 sat >90 - Per IR, no indication for intervention at this time. Will re-consult if presser requirement increases - Continue Unasyn ( 9/6 - ) - Continue azithromycin ( 9/7 - ) - Switch Duo-Nebs to BID - Continue Heparin gtt. Plan to switch to lovenox or elliquis tomorrow pending consistent improvement in blood pressures - Continue levophed as needed for blood pressure management. Wean to MAP >65 - f/u cortisol and TSH   Acute Encephalopathy, ETOH use disorder POA, History of ETOH use disorder MR brain with no acute findings 9/3 -Continue to monitor with improving oxygen needs - Consider repeat MRI if mentation does not improve today  History of MS, relapsing  remitting  On Tecfider[employer] outpatient P: -follow up with Neurology outpatient  R Hip wound POA -continue Santyl and hydrotherapy - care per wound care   Bipolar Disorder hx depression and SI with failure to thrive protein calorie malnutrition and failure to thrive -PT/OT recommending SNF placement and adult protective  services seeking court appointed guardian -Dietitian following. Continue dysphagia diet - Consider cortrak placement if not able to meet dietary needs  Constipation No bowel movement since admission.  - added mirlax daily  Best Practice (right click and "Reselect all SmartList Selections" daily)   Diet/type: dysphagia diet (see orders) DVT prophylaxis: systemic heparin GI prophylaxis: N/A Lines: N/A Foley:  N/A Code Status:  full code Last date of multidisciplinary goals of care discussion []   Labs   CBC: Recent Labs  Lab 12/23/20 2137 12/27/20 2050 12/28/20 0055 12/29/20 0224  WBC 7.1 9.4 9.6 10.3  NEUTROABS 5.7  --   --   --   HGB 11.8* 10.9* 11.3* 9.9*  HCT 37.5* 32.4* 35.0* 30.4*  MCV 97.4 91.5 93.3 93.0  PLT 241 230 222 241    Basic Metabolic Panel: Recent Labs  Lab 12/23/20 2137 12/24/20 0258 12/25/20 0024 12/28/20 0055 12/29/20 0224  NA 138 138 140 142 144  K 5.1 4.6 4.0 4.0 3.8  CL 107 109 110 111 113*  CO2 19* 17* 20* 23 22  GLUCOSE 93 95 90 96 118*  BUN 27* 27* 22 17 18   CREATININE 1.24 1.07 0.95 1.09 1.01  CALCIUM 9.0 9.0 8.7* 8.2* 8.2*  MG  --  2.1  --  2.0  --   PHOS  --  3.6  --   --   --    GFR: Estimated Creatinine Clearance: 68.2 mL/min (by C-G formula based on SCr of 1.01 mg/dL). Recent Labs  Lab 12/23/20 2137 12/27/20 2050 12/28/20 0055 12/28/20 0434 12/29/20 0224  WBC 7.1 9.4 9.6  --  10.3  LATICACIDVEN 1.8  --   --  1.4  --     Liver Function Tests: Recent Labs  Lab 12/23/20 2137 12/24/20 0258 12/25/20 0024 12/28/20 0055  AST 74* 62* 43* 62*  ALT 35 32 27 33  ALKPHOS 68 71 63 80  BILITOT 1.5* 1.5* 1.5* 1.5*  PROT 6.5 6.6 5.7* 5.4*  ALBUMIN 3.0* 3.0* 2.5* 2.2*   No results for input(s): LIPASE, AMYLASE in the last 168 hours. Recent Labs  Lab 12/24/20 0428  AMMONIA 23    ABG    Component Value Date/Time   PHART 7.503 (H) 12/27/2020 2130   PCO2ART 29.2 (L) 12/27/2020 2130   PO2ART 47.9 (L) 12/27/2020 2130    HCO3 22.4 12/27/2020 2130   TCO2 22.8 10/30/2011 0540   ACIDBASEDEF 0.2 12/27/2020 2130   O2SAT 80.6 12/27/2020 2130     Coagulation Profile: Recent Labs  Lab 12/23/20 2137 12/24/20 0258  INR 1.0 1.0    Cardiac Enzymes: Recent Labs  Lab 12/23/20 2137  CKTOTAL 2,476*    HbA1C: Hgb A1c MFr Bld  Date/Time Value Ref Range Status  05/06/2009 06:05 AM  4.6 - 6.1 % Final   5.6 (NOTE) The ADA recommends the following therapeutic goal for glycemic control related to Hgb A1c measurement: Goal of therapy: <6.5 Hgb A1c  Reference: American Diabetes Association: Clinical Practice Recommendations 2010, Diabetes Care, 2010, 33: (Suppl  1).    CBG: Recent Labs  Lab 12/28/20 0204 12/28/20 0331  GLUCAP 91 99    Critical care time:    Clayborn Milnes  Cleda Daub, MS4

## 2020-12-29 NOTE — Progress Notes (Addendum)
ANTICOAGULATION CONSULT NOTE - Follow Up Consult  Pharmacy Consult for Heparin Indication: pulmonary embolus  No Known Allergies  Patient Measurements: Height: 5\' 11"  (180.3 cm) Weight: 66.1 kg (145 lb 11.6 oz) IBW/kg (Calculated) : 75.3 Heparin Dosing Weight: 66.1 kg   Vital Signs: Temp: 98.5 F (36.9 C) (09/08 0352) Temp Source: Axillary (09/08 0352) BP: 72/41 (09/08 1000) Pulse Rate: 94 (09/08 1000)  Labs: Recent Labs    12/27/20 2050 12/28/20 0055 12/28/20 0129 12/28/20 0401 12/28/20 0800 12/28/20 1602 12/29/20 0224 12/29/20 0942  HGB 10.9* 11.3*  --   --   --   --  9.9*  --   HCT 32.4* 35.0*  --   --   --   --  30.4*  --   PLT 230 222  --   --   --   --  241  --   HEPARINUNFRC  --   --   --   --    < > 0.21* 0.31 0.36  CREATININE  --  1.09  --   --   --   --  1.01  --   TROPONINIHS  --   --  61* 66*  --   --   --   --    < > = values in this interval not displayed.    Estimated Creatinine Clearance: 68.2 mL/min (by C-G formula based on SCr of 1.01 mg/dL).   Medications:  Scheduled:   Chlorhexidine Gluconate Cloth  6 each Topical Q0600   collagenase   Topical Daily   feeding supplement  237 mL Oral TID BM   folic acid  1 mg Oral Daily   Gerhardt's butt cream   Topical TID   ipratropium-albuterol  3 mL Nebulization BID   mouth rinse  15 mL Mouth Rinse BID   multivitamin with minerals  1 tablet Oral Daily   OLANZapine zydis  10 mg Oral QHS   polyethylene glycol  17 g Oral Daily   sertraline  25 mg Oral Daily   thiamine  100 mg Oral Daily   Infusions:   sodium chloride 10 mL/hr at 12/29/20 0900   ampicillin-sulbactam (UNASYN) IV 3 g (12/29/20 0908)   doxycycline (VIBRAMYCIN) IV 100 mg (12/29/20 1022)   heparin 1,600 Units/hr (12/29/20 0900)   norepinephrine (LEVOPHED) Adult infusion 2 mcg/min (12/29/20 1003)   PRN: acetaminophen, acetaminophen, fentaNYL (SUBLIMAZE) injection, ondansetron **OR** ondansetron (ZOFRAN) IV  Assessment: 65 yo male  admitted on 9/3 with AMS and aspiration pneumonia.  On 9/6, patient developed progressively increasing oxygen requirement, CTA chest revealed acute submassive PE with evidence of RHS.  Pharmacy consulted for heparin dosing.   Per IR, overall clot burden deemed small in volume with respiratory status likely due to near complete consolidation and collapse of the left lower lobe.  Not planning intervention.   Heparin level this morning therapeutic at 0.36, on 1600 units/hr.  Will increase heparin infusion rate slightly to target upper end of goal.  Renal function and CBC stable.  No line issues or signs/symptoms of bleeding noted per RN.   Goal of Therapy:  Heparin level 0.3-0.7 units/ml Monitor platelets by anticoagulation protocol: Yes   Plan:  Increase heparin infusion to 1700 units/hr. Check next heparin level with AM labs.  Monitor CBC and for signs/symptoms of bleeding.  F/u long-term anticoagulation plan.   11/6, PharmD PGY1 Pharmacy Resident Phone [phone] 12/29/2020 11:06 AM   Please check AMION for all [facility] Pharmacy phone numbers After 10:00  PM, call Main Pharmacy [phone]

## 2020-12-29 NOTE — Progress Notes (Signed)
PROGRESS NOTE    [name]   BJY:[id number]  DOB: 29-Jun-1955  PCP: Rinaldo Cloud, MD    DOA: 12/23/2020 LOS: 4    Brief Narrative / Hospital Course to Date:   [name] is a 65 year old male with past medical history significant for multiple sclerosis, EtOH abuse, bipolar disorder, history of GI bleed 2/2 gastric ulcer/esophagitis, tobacco use disorder who presented to [facility] ED via EMS for confusion and being found down by his roommate for likely 3 days.  Roommate reports generalized weakness and confusion for several days.  Apparently the patient was laying in his bed for 3 days covered in feces and urine; however refusing any type of assistance.  Per EMS, patient living in disheveled/poor environment; APS report was filed.  Admitted for further evaluation and management.   Patient had an aspiration event and was started on Unasyn.  Due to increasing dyspnea and oxygen requirement, CTA chest was obtained and showed bilateral acute PE's.  Started on heparin and PCCM was consulted.    Assessment & Plan   Principal Problem:   Acute respiratory failure with hypoxia (HCC) Active Problems:   MS (multiple sclerosis) (HCC)   Alcohol abuse   Bipolar I disorder (HCC)   Elevated brain natriuretic peptide (BNP) level   Acute encephalopathy   Polysubstance abuse (HCC)   Decubitus ulcer of hip   Rhabdomyolysis   Protein-calorie malnutrition, severe   Acute pulmonary embolism (HCC)   Drug abuse, marijuana   Aspiration pneumonia (HCC)   Acute respiratory failure with hypoxia due to Acute bilateral pulmonary emboli -with imaging evidence of right heart strain.  Felt too high risk for tPA or catheter-directed intervention.   Aspiration pneumonia with left basilar consolidation on imaging. Shock multifactorial septic vs circulatory  -- PCCM following, appreciate recommendations --Continue heparin drip --On Unasyn and Zithromax, Duonebs --Levophed per PCCM, maintain MAP>65 (weaned off  today 9/8) --Supplemental oxygen to maintain sats above 90%, wean as tolerated --Aspiration precautions  Acute metabolic encephalopathy -POA, improving Presented by EMS after being found down by roommate x3 days.  Associated generalized weakness.  Initially no evidence of infection, afebrile no leukocytosis.  UDS was positive for THC, EtOH less than 10.  CT head with cerebral atrophy but no acute findings.  UA negative.  Ammonia level normal.  Lactate normal.  Negative for COVID-19 and influenza A/B.  Unclear etiology but possibly due to alcohol abuse vs hypoxia given above. --may repeat MRI if not improving w/in next 24 hrs --Delirium precautions:     -Lights and TV off, minimize interruptions at night    -Blinds open and lights on during day    -Glasses/hearing aid with patient    -Frequent reorientation    -PT/OT when able    -Avoid sedation medications/Beers list medications  Rhabdomyolysis, mild, POA -patient was found down for 3 days.  Presented with a right hip wound and elevated CK20 476. --Treated with IV fluids now off --Supportive care and follow renal function  Right hip wound, POA -wound noted to have eschar blistering.  Seen by wound care.  Previously discussed with general surgery, recommended Santyl and hydrotherapy. -- PT following for hydrotherapy --Continue Santyl daily  Abnormal blood culture staff epidermidis -grew in 2/4 bottles of blood cultures from admission.  Suspect this is contamination.  Initially was started on bank prior to [facility] ID result, since discontinued. Patient remains afebrile with no leukocytosis --Follow repeat blood cultures of 9/4: No growth to date --Monitor clinically  Alcohol use disorder -uncertain type or quantity of alcohol.  On admission patient reported that he drinks "3 packs/day" --Monitor on CIWA protocol --Continue multivitamin folate and thiamine --TOC consulted for subs abuse resources  Tobacco use disorder -reports he smokes 3  packs/day.  Counseled extensively on need for smoking cessation. --Nicotine patch  History of multiple sclerosis, relapsing remitting type -follows with neurology Dr. Epimenio Foot.  Last seen 05/05/2020.  On Tecfidera since 2013; patient reports not taking medications just prior to admission.  MRI brain with and without contrast showed advanced white matter disease that is progressed from 2019 image with no acute or subacute findings --Hold off on resuming Tecfidera since he has been noncompliant outpatient --Outpatient follow-up with neuro  History bipolar disorder History of depression with suicidal ideations -stable Home regimen appears to be sertraline 50 mg daily, olanzapine 10 mg at bedtime.  Recently noncompliant with any medications. --Resumed on sertraline 25 mg daily and olanzapine 10 mg nightly  Adult failure to thrive -patient presented via EMS after being found down times several days by roommate.  Per EMS report, house noted in deplorable unlivable conditions, patient with right hip wound and just shoveled, covered in urine and feces --APS report was filed and currently seeking court appointed guardianship --TOC following  Generalized weakness /physical debility /deconditioning  PT and OT recommend SNF placement.   TOC following.   Patient BMI: Body mass index is 20.32 kg/m.   DVT prophylaxis: SCDs Start: 12/24/20 4098   Diet:  Diet Orders (From admission, onward)     Start     Ordered   12/26/20 1349  DIET DYS 3 Room service appropriate? Yes with Assist; Fluid consistency: Thin  Diet effective now       Question Answer Comment  Room service appropriate? Yes with Assist   Fluid consistency: Thin      12/26/20 1348              Code Status: Full Code   Subjective 12/29/20    Patient seen in ICU this morning.  His vocal projection is still very soft, difficult to hear him.  When asked if short of breath, he stated "not right now".  No acute events reported.    Pt  remains in soft restraints for safety as he pulls on lines and IV.  Currently on low dose Levophed for BP support.  Disposition Plan & Communication   Status is: Inpatient  Remains inpatient appropriate because:Inpatient level of care appropriate due to severity of illness  Dispo: The patient is from: Home              Anticipated d/c is to: SNF              Patient currently is not medically stable to d/c.   Difficult to place patient No     Consults, Procedures, Significant Events   Consultants:  PCCM  Procedures:  None  Antimicrobials:  Anti-infectives (From admission, onward)    Start     Dose/Rate Route Frequency Ordered Stop   12/29/20 1000  doxycycline (VIBRAMYCIN) 100 mg in sodium chloride 0.9 % 250 mL IVPB        100 mg 125 mL/hr over 120 Minutes Intravenous Every 12 hours 12/28/20 1349 01/02/21 0959   12/28/20 1215  azithromycin (ZITHROMAX) 500 mg in sodium chloride 0.9 % 250 mL IVPB  Status:  Discontinued        500 mg 250 mL/hr over 60 Minutes Intravenous Every 24 hours  12/28/20 1125 12/28/20 1349   12/27/20 0900  Ampicillin-Sulbactam (UNASYN) 3 g in sodium chloride 0.9 % 100 mL IVPB        3 g 200 mL/hr over 30 Minutes Intravenous Every 6 hours 12/27/20 0803     12/25/20 1600  vancomycin (VANCOREADY) IVPB 1250 mg/250 mL  Status:  Discontinued        1,250 mg 166.7 mL/hr over 90 Minutes Intravenous Every 24 hours 12/24/20 1544 12/25/20 0727   12/24/20 1630  vancomycin (VANCOREADY) IVPB 1500 mg/300 mL        1,500 mg 150 mL/hr over 120 Minutes Intravenous  Once 12/24/20 1544 12/24/20 1859         Micro    Objective   Vitals:   12/29/20 1400 12/29/20 1500 12/29/20 1505 12/29/20 1510  BP: (!) 101/55 93/77    Pulse: (!) 107 97  93  Resp: (!) 31 (!) 27  (!) 26  Temp:   98.4 F (36.9 C)   TempSrc:   Oral   SpO2: 96% 97%  98%  Weight:      Height:        Intake/Output Summary (Last 24 hours) at 12/29/2020 1712 Last data filed at 12/29/2020  1500 Gross per 24 hour  Intake 1677.61 ml  Output 700 ml  Net 977.61 ml   Filed Weights   12/23/20 2115 12/28/20 0205  Weight: 72.6 kg 66.1 kg    Physical Exam:  General exam: awake, alert, no acute distress, chronically ill-appearing, underweight HEENT: High flow nasal cannula in place, moist mucus membranes, hearing grossly normal  Respiratory system: overall lungs clear, no wheezes or rhonchi, normal respiratory effort, on 8 L/min HFNC oxygen. Cardiovascular system: normal S1/S2, RRR, no pedal edema.   Central nervous system: normal but very weak vocal projection, exam limited by patient in soft restraints, follows commands appropriately Extremities: Upper extremity soft restraints in place, no edema, right lateral hip dressing clean dry intact with no signs of surrounding erythema Psychiatry: normal mood, congruent affect, unable to assess judgment and insight at this time  Labs   Data Reviewed: I have personally reviewed following labs and imaging studies  CBC: Recent Labs  Lab 12/23/20 2137 12/27/20 2050 12/28/20 0055 12/29/20 0224  WBC 7.1 9.4 9.6 10.3  NEUTROABS 5.7  --   --   --   HGB 11.8* 10.9* 11.3* 9.9*  HCT 37.5* 32.4* 35.0* 30.4*  MCV 97.4 91.5 93.3 93.0  PLT 241 230 222 241   Basic Metabolic Panel: Recent Labs  Lab 12/23/20 2137 12/24/20 0258 12/25/20 0024 12/28/20 0055 12/29/20 0224 12/29/20 0942  NA 138 138 140 142 144  --   K 5.1 4.6 4.0 4.0 3.8  --   CL 107 109 110 111 113*  --   CO2 19* 17* 20* 23 22  --   GLUCOSE 93 95 90 96 118*  --   BUN 27* 27* 22 17 18   --   CREATININE 1.24 1.07 0.95 1.09 1.01  --   CALCIUM 9.0 9.0 8.7* 8.2* 8.2*  --   MG  --  2.1  --  2.0  --  2.2  PHOS  --  3.6  --   --   --  3.4   GFR: Estimated Creatinine Clearance: 68.2 mL/min (by C-G formula based on SCr of 1.01 mg/dL). Liver Function Tests: Recent Labs  Lab 12/23/20 2137 12/24/20 0258 12/25/20 0024 12/28/20 0055  AST 74* 62* 43* 62*  ALT 35 32  27 33   ALKPHOS 68 71 63 80  BILITOT 1.5* 1.5* 1.5* 1.5*  PROT 6.5 6.6 5.7* 5.4*  ALBUMIN 3.0* 3.0* 2.5* 2.2*   No results for input(s): LIPASE, AMYLASE in the last 168 hours. Recent Labs  Lab 12/24/20 0428  AMMONIA 23   Coagulation Profile: Recent Labs  Lab 12/23/20 2137 12/24/20 0258  INR 1.0 1.0   Cardiac Enzymes: Recent Labs  Lab 12/23/20 2137  CKTOTAL 2,476*   BNP (last 3 results) No results for input(s): PROBNP in the last 8760 hours. HbA1C: No results for input(s): HGBA1C in the last 72 hours. CBG: Recent Labs  Lab 12/28/20 0204 12/28/20 0331  GLUCAP 91 99   Lipid Profile: No results for input(s): CHOL, HDL, LDLCALC, TRIG, CHOLHDL, LDLDIRECT in the last 72 hours. Thyroid Function Tests: Recent Labs    12/29/20 0942  TSH 1.618   Anemia Panel: Recent Labs    12/29/20 0942  VITAMINB12 476  FOLATE 13.6   Sepsis Labs: Recent Labs  Lab 12/23/20 2137 12/28/20 0434  LATICACIDVEN 1.8 1.4    Recent Results (from the past 240 hour(s))  Urine Culture     Status: None   Collection Time: 12/23/20  9:38 PM   Specimen: In/Out Cath Urine  Result Value Ref Range Status   Specimen Description IN/OUT CATH URINE  Final   Special Requests NONE  Final   Culture   Final    NO GROWTH Performed at [facility], [address], [place], [place] [address]    Report Status 12/25/2020 FINAL  Final  Culture, blood (Routine x 2)     Status: Abnormal   Collection Time: 12/23/20 10:15 PM   Specimen: BLOOD  Result Value Ref Range Status   Specimen Description BLOOD LEFT ANTECUBITAL  Final   Special Requests   Final    BOTTLES DRAWN AEROBIC AND ANAEROBIC Blood Culture adequate volume   Culture  Setup Time   Final    GRAM POSITIVE COCCI IN BOTH AEROBIC AND ANAEROBIC BOTTLES CRITICAL RESULT CALLED TO, READ BACK BY AND VERIFIED WITH: L CURRAN PHARMD @1706  12/24/20 EB    Culture (A)  Final    STAPHYLOCOCCUS HOMINIS STAPHYLOCOCCUS SIMULANS STAPHYLOCOCCUS  EPIDERMIDIS THE SIGNIFICANCE OF ISOLATING THIS ORGANISM FROM A SINGLE SET OF BLOOD CULTURES WHEN MULTIPLE SETS ARE DRAWN IS UNCERTAIN. PLEASE NOTIFY THE MICROBIOLOGY DEPARTMENT WITHIN ONE WEEK IF SPECIATION AND SENSITIVITIES ARE REQUIRED. Performed at [facility], [address], [place], [place] [address]    Report Status 12/26/2020 FINAL  Final  Blood Culture ID Panel (Reflexed)     Status: Abnormal   Collection Time: 12/23/20 10:15 PM  Result Value Ref Range Status   Enterococcus faecalis NOT DETECTED NOT DETECTED Final   Enterococcus Faecium NOT DETECTED NOT DETECTED Final   Listeria monocytogenes NOT DETECTED NOT DETECTED Final   Staphylococcus species DETECTED (A) NOT DETECTED Final    Comment: CRITICAL RESULT CALLED TO, READ BACK BY AND VERIFIED WITH: L CURRAN PHARMD @1706  12/24/20 EB    Staphylococcus aureus (BCID) NOT DETECTED NOT DETECTED Final   Staphylococcus epidermidis DETECTED (A) NOT DETECTED Final    Comment: CRITICAL RESULT CALLED TO, READ BACK BY AND VERIFIED WITH: L CURRAN PHARMD @1706  12/24/20 EB    Staphylococcus lugdunensis NOT DETECTED NOT DETECTED Final   Streptococcus species NOT DETECTED NOT DETECTED Final   Streptococcus agalactiae NOT DETECTED NOT DETECTED Final   Streptococcus pneumoniae NOT DETECTED NOT DETECTED Final   Streptococcus pyogenes  NOT DETECTED NOT DETECTED Final   A.calcoaceticus-baumannii NOT DETECTED NOT DETECTED Final   Bacteroides fragilis NOT DETECTED NOT DETECTED Final   Enterobacterales NOT DETECTED NOT DETECTED Final   Enterobacter cloacae complex NOT DETECTED NOT DETECTED Final   Escherichia coli NOT DETECTED NOT DETECTED Final   Klebsiella aerogenes NOT DETECTED NOT DETECTED Final   Klebsiella oxytoca NOT DETECTED NOT DETECTED Final   Klebsiella pneumoniae NOT DETECTED NOT DETECTED Final   Proteus species NOT DETECTED NOT DETECTED Final   Salmonella species NOT DETECTED NOT DETECTED Final   Serratia marcescens NOT DETECTED NOT  DETECTED Final   Haemophilus influenzae NOT DETECTED NOT DETECTED Final   Neisseria meningitidis NOT DETECTED NOT DETECTED Final   Pseudomonas aeruginosa NOT DETECTED NOT DETECTED Final   Stenotrophomonas maltophilia NOT DETECTED NOT DETECTED Final   Candida albicans NOT DETECTED NOT DETECTED Final   Candida auris NOT DETECTED NOT DETECTED Final   Candida glabrata NOT DETECTED NOT DETECTED Final   Candida krusei NOT DETECTED NOT DETECTED Final   Candida parapsilosis NOT DETECTED NOT DETECTED Final   Candida tropicalis NOT DETECTED NOT DETECTED Final   Cryptococcus neoformans/gattii NOT DETECTED NOT DETECTED Final   Methicillin resistance mecA/C NOT DETECTED NOT DETECTED Final    Comment: Performed at [facility], [address] [place], [place] [address]  Culture, blood (Routine x 2)     Status: None   Collection Time: 12/23/20 10:20 PM   Specimen: BLOOD RIGHT HAND  Result Value Ref Range Status   Specimen Description BLOOD RIGHT HAND  Final   Special Requests   Final    BOTTLES DRAWN AEROBIC AND ANAEROBIC Blood Culture adequate volume   Culture   Final    NO GROWTH 5 DAYS Performed at [facility], [address], [place], [place] [address]    Report Status 12/29/2020 FINAL  Final  Resp Panel by RT-PCR (Flu A&B, Covid) Nasopharyngeal Swab     Status: None   Collection Time: 12/23/20 10:30 PM   Specimen: Nasopharyngeal Swab; Nasopharyngeal(NP) swabs in vial transport medium  Result Value Ref Range Status   SARS Coronavirus 2 by RT PCR NEGATIVE NEGATIVE Final    Comment: (NOTE) SARS-CoV-2 target nucleic acids are NOT DETECTED.  The SARS-CoV-2 RNA is generally detectable in upper respiratory specimens during the acute phase of infection. The lowest concentration of SARS-CoV-2 viral copies this assay can detect is 138 copies/mL. A negative result does not preclude SARS-Cov-2 infection and should not be used as the sole basis for treatment or other patient management  decisions. A negative result may occur with  improper specimen collection/handling, submission of specimen other than nasopharyngeal swab, presence of viral mutation(s) within the areas targeted by this assay, and inadequate number of viral copies(<138 copies/mL). A negative result must be combined with clinical observations, patient history, and epidemiological information. The expected result is Negative.  Fact Sheet for Patients:  BloggerCourse.com  Fact Sheet for Healthcare Providers:  SeriousBroker.it  This test is no t yet approved or cleared by the [place] FDA and  has been authorized for detection and/or diagnosis of SARS-CoV-2 by FDA under an Emergency Use Authorization (EUA). This EUA will remain  in effect (meaning this test can be used) for the duration of the COVID-19 declaration under Section 564(b)(1) of the Act, 21 U.S.C.section 360bbb-3(b)(1), unless the authorization is terminated  or revoked sooner.       Influenza A by PCR NEGATIVE NEGATIVE Final   Influenza  B by PCR NEGATIVE NEGATIVE Final    Comment: (NOTE) The Xpert Xpress SARS-CoV-2/FLU/RSV plus assay is intended as an aid in the diagnosis of influenza from Nasopharyngeal swab specimens and should not be used as a sole basis for treatment. Nasal washings and aspirates are unacceptable for Xpert Xpress SARS-CoV-2/FLU/RSV testing.  Fact Sheet for Patients: BloggerCourse.com  Fact Sheet for Healthcare Providers: SeriousBroker.it  This test is not yet approved or cleared by the [place] FDA and has been authorized for detection and/or diagnosis of SARS-CoV-2 by FDA under an Emergency Use Authorization (EUA). This EUA will remain in effect (meaning this test can be used) for the duration of the COVID-19 declaration under Section 564(b)(1) of the Act, 21 U.S.C. section 360bbb-3(b)(1), unless the  authorization is terminated or revoked.  Performed at [facility] Lab, [address], [place], [place] [address]   Culture, blood (routine x 2)     Status: None (Preliminary result)   Collection Time: 12/25/20  8:06 AM   Specimen: BLOOD  Result Value Ref Range Status   Specimen Description BLOOD BLOOD RIGHT HAND  Final   Special Requests   Final    BOTTLES DRAWN AEROBIC AND ANAEROBIC Blood Culture adequate volume   Culture   Final    NO GROWTH 4 DAYS Performed at [facility], [address], [place], [place] [address]    Report Status PENDING  Incomplete  Culture, blood (routine x 2)     Status: None (Preliminary result)   Collection Time: 12/25/20  8:07 AM   Specimen: BLOOD  Result Value Ref Range Status   Specimen Description BLOOD BLOOD RIGHT HAND  Final   Special Requests   Final    BOTTLES DRAWN AEROBIC AND ANAEROBIC Blood Culture adequate volume   Culture   Final    NO GROWTH 4 DAYS Performed at [facility], [address], [place], [place] [address]    Report Status PENDING  Incomplete  MRSA Next Gen by PCR, Nasal     Status: None   Collection Time: 12/28/20  2:05 AM   Specimen: Nasal Mucosa; Nasal Swab  Result Value Ref Range Status   MRSA by PCR Next Gen NOT DETECTED NOT DETECTED Final    Comment: (NOTE) The GeneXpert MRSA Assay (FDA approved for NASAL specimens only), is one component of a comprehensive MRSA colonization surveillance program. It is not intended to diagnose MRSA infection nor to guide or monitor treatment for MRSA infections. Test performance is not FDA approved in patients less than 60 years old. Performed at [facility], [address] [place], [place] [address]       Imaging Studies   CT Angio Chest Pulmonary Embolism (PE) W or WO Contrast  Result Date: 12/28/2020 CLINICAL DATA:  PE suspected, high prob EXAM: CT ANGIOGRAPHY CHEST WITH CONTRAST TECHNIQUE: Multidetector CT imaging of the chest was performed using  the standard protocol during bolus administration of intravenous contrast. Multiplanar CT image reconstructions and MIPs were obtained to evaluate the vascular anatomy. CONTRAST:  93mL OMNIPAQUE IOHEXOL 350 MG/ML SOLN COMPARISON:  None. FINDINGS: Cardiovascular: There is adequate opacification of the pulmonary arterial tree. There are branching intraluminal filling defects identified within the lobar pulmonary arteries bilaterally as well as several intraluminal filling defects within multiple segmental pulmonary arteries of the lower lobes bilaterally in keeping with acute pulmonary embolism. The central pulmonary arteries are of normal caliber. There is CT evidence of right heart strain, however, with  an RV/LV ratio of 1.34. Moderate multi-vessel coronary artery calcification. Global cardiac size within normal limits. No pericardial effusion. Mild atherosclerotic calcification noted within the thoracic aorta. No aortic aneurysm. Mediastinum/Nodes: No enlarged mediastinal, hilar, or axillary lymph nodes. Thyroid gland, trachea, and esophagus demonstrate no significant findings. Small hiatal hernia. Lungs/Pleura: There is collapse and consolidation of the left lower lobe. Scattered nonspecific infiltrate within the a left upper lobe. The right lung is clear. No pneumothorax or pleural effusion. No central obstructing lesion. There is mild bronchial wall thickening noted centrally in keeping with mild airway inflammation. Upper Abdomen: No acute abnormality. Musculoskeletal: No acute bone abnormality. Review of the MIP images confirms the above findings. IMPRESSION: Positive for acute PE with CT evidence of right heart strain (RV/LV Ratio = 1.34) consistent with at least submassive (intermediate risk) PE. The presence of right heart strain has been associated with an increased risk of morbidity and mortality. Please refer to the "PE Focused" order set in EPIC. Collapse and consolidation of the left lower lobe.  Bronchial wall thickening in keeping with airway inflammation. Moderate multi-vessel coronary artery calcification. Aortic Atherosclerosis (ICD10-I70.0). Attempts are being made at this time to contact the managing clinician for direct verbal communication of these findings. Electronically Signed   By: Helyn NumbersAshesh  Parikh M.D.   On: 12/28/2020 00:19   ECHOCARDIOGRAM LIMITED  Result Date: 12/28/2020    ECHOCARDIOGRAM LIMITED REPORT   Patient Name:   [name] Date of Exam: 12/28/2020 Medical Rec #:  [id number]    Height:       71.0 in Accession #:    [id number]   Weight:       145.7 lb Date of Birth:  1955/07/13    BSA:          1.843 m Patient Age:    65 years     BP:           89/57 mmHg Patient Gender: M            HR:           86 bpm. Exam Location:  Inpatient Procedure: 3D Echo, Limited Echo, Limited Color Doppler and Cardiac Doppler STAT ECHO Indications:     Pulmonary Embolus I26.09  History:         Patient has no prior history of Echocardiogram examinations.                  Risk Factors:Current Smoker. Acute metabolic encephalopathy.                  Polysubstance abuse. Rhabdomyolysis.  Sonographer:     Leta Junglingiffany Cooper RDCS Referring Phys:  [id number] Martina SinnerJONATHAN B DEWALD Diagnosing Phys: Rinaldo CloudMohan Harwani MD IMPRESSIONS  1. Left ventricular ejection fraction, by estimation, is 50 to 55%. The left ventricle has normal function. The left ventricle has no regional wall motion abnormalities. Left ventricular diastolic parameters are consistent with Grade I diastolic dysfunction (impaired relaxation).  2. Right ventricular systolic function is mildly reduced. The right ventricular size is mildly enlarged. There is moderately elevated pulmonary artery systolic pressure.  3. The mitral valve is normal in structure. Trivial mitral valve regurgitation.  4. Cannot rule out vegetation . TEE RECOMMENDED IF CLINICALLY INDICATED.. Tricuspid valve regurgitation is mild.  5. The aortic valve is normal in structure. Aortic valve  regurgitation is not visualized. No aortic stenosis is present.  6. The inferior vena cava is normal in size with greater than 50% respiratory variability, suggesting right  atrial pressure of 3 mmHg. FINDINGS  Left Ventricle: Left ventricular ejection fraction, by estimation, is 50 to 55%. The left ventricle has normal function. The left ventricle has no regional wall motion abnormalities. The left ventricular internal cavity size was normal in size. There is  no left ventricular hypertrophy. Left ventricular diastolic parameters are consistent with Grade I diastolic dysfunction (impaired relaxation). Right Ventricle: The right ventricular size is mildly enlarged. Right ventricular systolic function is mildly reduced. There is moderately elevated pulmonary artery systolic pressure. The tricuspid regurgitant velocity is 3.38 m/s, and with an assumed right atrial pressure of 3 mmHg, the estimated right ventricular systolic pressure is 48.7 mmHg. Left Atrium: Left atrial size was normal in size. Right Atrium: Right atrial size was normal in size. Pericardium: There is no evidence of pericardial effusion. Mitral Valve: The mitral valve is normal in structure. Trivial mitral valve regurgitation. Tricuspid Valve: Cannot rule out vegetation . TEE RECOMMENDED IF CLINICALLY INDICATED. Tricuspid valve regurgitation is mild. Aortic Valve: The aortic valve is normal in structure. Aortic valve regurgitation is not visualized. No aortic stenosis is present. Pulmonic Valve: The pulmonic valve was normal in structure. Pulmonic valve regurgitation is trivial. Aorta: The aortic root is normal in size and structure. Venous: The inferior vena cava is normal in size with greater than 50% respiratory variability, suggesting right atrial pressure of 3 mmHg. LEFT VENTRICLE PLAX 2D LVIDd:         4.30 cm     Diastology LVIDs:         3.10 cm     LV e' medial:    5.98 cm/s LV PW:         0.80 cm     LV E/e' medial:  7.5 LV IVS:        0.90  cm     LV e' lateral:   7.51 cm/s LVOT diam:     2.30 cm     LV E/e' lateral: 5.9 LV SV:         47 LV SV Index:   26 LVOT Area:     4.15 cm  LV Volumes (MOD) LV vol d, MOD A2C: 72.4 ml LV vol d, MOD A4C: 87.1 ml LV vol s, MOD A2C: 47.4 ml LV vol s, MOD A4C: 40.5 ml LV SV MOD A2C:     25.0 ml LV SV MOD A4C:     87.1 ml LV SV MOD BP:      34.9 ml RIGHT VENTRICLE RV S prime:     11.50 cm/s TAPSE (M-mode): 1.5 cm LEFT ATRIUM         Index      RIGHT ATRIUM           Index LA diam:    2.80 cm 1.52 cm/m RA Area:     12.40 cm                                RA Volume:   32.50 ml  17.63 ml/m  AORTIC VALVE LVOT Vmax:   64.30 cm/s LVOT Vmean:  39.800 cm/s LVOT VTI:    0.114 m  AORTA Ao Asc diam: 3.00 cm MITRAL VALVE               TRICUSPID VALVE MV Area (PHT): 4.57 cm    TR Peak grad:   45.7 mmHg MV Decel Time: 166 msec    TR Vmax:  338.00 cm/s MV E velocity: 44.60 cm/s MV A velocity: 61.70 cm/s  SHUNTS MV E/A ratio:  0.72        Systemic VTI:  0.11 m                            Systemic Diam: 2.30 cm Rinaldo Cloud MD Electronically signed by Rinaldo Cloud MD Signature Date/Time: 12/28/2020/11:14:03 AM    Final      Medications   Scheduled Meds:  Chlorhexidine Gluconate Cloth  6 each Topical Q0600   collagenase   Topical Daily   feeding supplement  237 mL Oral TID BM   folic acid  1 mg Oral Daily   Gerhardt's butt cream   Topical TID   ipratropium-albuterol  3 mL Nebulization BID   mouth rinse  15 mL Mouth Rinse BID   multivitamin with minerals  1 tablet Oral Daily   OLANZapine zydis  10 mg Oral QHS   polyethylene glycol  17 g Oral Daily   sertraline  25 mg Oral Daily   thiamine  100 mg Oral Daily   Continuous Infusions:  sodium chloride 10 mL/hr at 12/29/20 1500   ampicillin-sulbactam (UNASYN) IV 200 mL/hr at 12/29/20 1500   doxycycline (VIBRAMYCIN) IV Stopped (12/29/20 1222)   heparin 1,700 Units/hr (12/29/20 1500)   norepinephrine (LEVOPHED) Adult infusion 2 mcg/min (12/29/20 1500)        LOS: 4 days    Time spent: 25 minutes    Pennie Banter, DO [facility]  12/29/2020, 5:12 PM      If 7PM-7AM, please contact night-coverage. How to contact the [facility] Attending or Consulting provider 7A - 7P or covering provider during after hours 7P -7A, for this patient?    Check the care team in [facility] and look for a) attending/consulting TRH provider listed and b) the [facility] team listed Log into [email or web address] and use [place]'s universal password to access. If you do not have the password, please contact the hospital operator. Locate the [facility] provider you are looking for under [facility] and page to a number that you can be directly reached. If you still have difficulty reaching the provider, please page the [facility] (Director on Call) for the Hospitalists listed on amion for assistance.

## 2020-12-29 NOTE — Progress Notes (Signed)
ANTICOAGULATION CONSULT NOTE - Follow Up Consult  Pharmacy Consult for Heparin Indication: pulmonary embolus  No Known Allergies  Patient Measurements: Height: 5\' 11"  (180.3 cm) Weight: 66.1 kg (145 lb 11.6 oz) IBW/kg (Calculated) : 75.3 Heparin Dosing Weight: 66.1 kg   Vital Signs: Temp: 99.3 F (37.4 C) (09/07 2300) Temp Source: Axillary (09/07 2300) BP: 98/57 (09/08 0200) Pulse Rate: 88 (09/08 0200)  Labs: Recent Labs    12/27/20 2050 12/28/20 0055 12/28/20 0129 12/28/20 0401 12/28/20 0800 12/28/20 1602 12/29/20 0224  HGB 10.9* 11.3*  --   --   --   --  9.9*  HCT 32.4* 35.0*  --   --   --   --  30.4*  PLT 230 222  --   --   --   --  241  HEPARINUNFRC  --   --   --   --  0.22* 0.21* 0.31  CREATININE  --  1.09  --   --   --   --  1.01  TROPONINIHS  --   --  61* 66*  --   --   --      Estimated Creatinine Clearance: 68.2 mL/min (by C-G formula based on SCr of 1.01 mg/dL).   Medications:  Scheduled:   Chlorhexidine Gluconate Cloth  6 each Topical Q0600   collagenase   Topical Daily   feeding supplement  237 mL Oral TID BM   folic acid  1 mg Oral Daily   Gerhardt's butt cream   Topical TID   ipratropium-albuterol  3 mL Nebulization Q6H   mouth rinse  15 mL Mouth Rinse BID   multivitamin with minerals  1 tablet Oral Daily   OLANZapine zydis  10 mg Oral QHS   sertraline  25 mg Oral Daily   thiamine  100 mg Oral Daily   Infusions:   sodium chloride 10 mL/hr at 12/29/20 0200   ampicillin-sulbactam (UNASYN) IV Stopped (12/28/20 2043)   doxycycline (VIBRAMYCIN) IV     heparin 1,500 Units/hr (12/29/20 0200)   norepinephrine (LEVOPHED) Adult infusion Stopped (12/28/20 2305)   PRN: acetaminophen, acetaminophen, fentaNYL (SUBLIMAZE) injection, ondansetron **OR** ondansetron (ZOFRAN) IV  Assessment: 65 yo male admitted on 9/3 with AMS and aspiration pneumonia.  On 9/6, patient developed progressively increasing oxygen requirement, CTA chest revealed acute  submassive PE with evidence of RHS.  Pharmacy consulted for heparin dosing.   Per IR, overall clot burden deemed small in volume with respiratory status likely due to near complete consolidation and collapse of the left lower lobe. Not planning intervention.   Heparin level this morning therapeutic at 0.31, on 1500 units/hr.  No line issues or signs/symptoms of bleeding noted per RN.   Goal of Therapy:  Heparin level 0.3-0.7 units/ml Monitor platelets by anticoagulation protocol: Yes   Plan:  Increase IV heparin to 1600 units/hr. Check 8 hr heparin level.  Daily heparin level, CBC.  Monitor for signs/symptoms of bleeding.  F/u long-term anticoagulation plan.   11/6 PharmD., BCPS Clinical Pharmacist 12/29/2020 3:19 AM

## 2020-12-29 NOTE — Progress Notes (Signed)
BLE venous duplex has been completed.  Results can be found under chart review under CV PROC. 12/29/2020 5:31 PM Omolola Mittman RVT, RDMS

## 2020-12-29 NOTE — Progress Notes (Signed)
Physical Therapy Wound Treatment Patient Details  Name: [name] MRN: [id number] Date of Birth: 05-Feb-1956  Today's Date: 12/29/2020 Time: 1017-1040 Time Calculation (min): 23 min  Subjective  Subjective Assessment Subjective: pt able to express his need for pain meds/intolerance for the pain during PLS and debridement Patient and Family Stated Goals: None stated Date of Onset:  (Unknown - PTA) Prior Treatments: Dressing changes  Pain Score:  prmedicated with fentanyl-- still 5/10 with debridement  Wound Assessment  Pressure Injury 12/24/20 Hip Right Unstageable - Full thickness tissue loss in which the base of the injury is covered by slough (yellow, tan, gray, green or brown) and/or eschar (tan, brown or black) in the wound bed. Black (eschar) surrounded by yellow (Active)  Wound Image   12/26/20 1121  Dressing Type ABD;Barrier Film (skin prep);Gauze (Comment);Impregnated gauze (bismuth);Moist to dry;Normal saline moist dressing;Santyl 12/29/20 1047  Dressing Changed;Clean;Dry;Intact 12/29/20 1047  Dressing Change Frequency Twice a day 12/29/20 1047  State of Healing Eschar 12/29/20 1047  Site / Wound Assessment Brown;Pink;Red;Yellow 12/29/20 1047  % Wound base Red or Granulating 15% 12/29/20 1047  % Wound base Yellow/Fibrinous Exudate 75% 12/29/20 1047  % Wound base Black/Eschar 10% 12/29/20 1047  % Wound base Other/Granulation Tissue (Comment) 0% 12/29/20 1047  Peri-wound Assessment Intact 12/29/20 1047  Wound Length (cm) 8 cm 12/26/20 1045  Wound Width (cm) 4 cm 12/26/20 1045  Wound Depth (cm) 0 cm 12/26/20 1045  Wound Surface Area (cm^2) 32 cm^2 12/26/20 1045  Wound Volume (cm^3) 0 cm^3 12/26/20 1045  Tunneling (cm) 0 12/26/20 1121  Undermining (cm) 0 12/26/20 1121  Margins Unattached edges (unapproximated) 12/29/20 1047  Drainage Amount Minimal 12/29/20 1047  Drainage Description Purulent;Serous 12/29/20 1047  Treatment Cleansed;Debridement (Selective);Hydrotherapy  (Pulse lavage);Packing (Saline gauze) 12/29/20 1047      Hydrotherapy Pulsed lavage therapy - wound location: R hip Pulsed Lavage with Suction (psi): 8 psi Pulsed Lavage with Suction - Normal Saline Used: 1000 mL Pulsed Lavage Tip: Tip with splash shield Selective Debridement Selective Debridement - Location: R hip Selective Debridement - Tools Used: Forceps, Scalpel, Scissors Selective Debridement - Tissue Removed: yellow, brown nonviable tissue    Wound Assessment and Plan  Wound Therapy - Assess/Plan/Recommendations Wound Therapy - Clinical Statement: Uncovered tissue post blister removal healthy and pink, covered by anti-microbial dressing.  Continue to work on debriding necrotic rt hip wound. Will continue hydrotherapy to facilitate removal of necrotic tissue. Wound Therapy - Functional Problem List: Decreased tolerance for positioning OOB due to MS and wound. Factors Delaying/Impairing Wound Healing: Immobility, Multiple medical problems Hydrotherapy Plan: Debridement, Dressing change, Patient/family education, Pulsatile lavage with suction Wound Therapy - Frequency: 6X / week Wound Therapy - Follow Up Recommendations: dressing changes by RN  Wound Therapy Goals- Improve the function of patient's integumentary system by progressing the wound(s) through the phases of wound healing (inflammation - proliferation - remodeling) by: Wound Therapy Goals - Improve the function of patient's integumentary system by progressing the wound(s) through the phases of wound healing by: Decrease Necrotic Tissue to: 20% Decrease Necrotic Tissue - Progress: Progressing toward goal Increase Granulation Tissue to: 80% Increase Granulation Tissue - Progress: Progressing toward goal Goals/treatment plan/discharge plan were made with and agreed upon by patient/family: No, Patient unable to participate in goals/treatment/discharge plan and family unavailable Time For Goal Achievement: 7 days Wound  Therapy - Potential for Goals: Good  Goals will be updated until maximal potential achieved or discharge criteria met.  Discharge criteria: when goals achieved, discharge  from hospital, MD decision/surgical intervention, no progress towards goals, refusal/missing three consecutive treatments without notification or medical reason.  GP     Charges PT Wound Care Charges $Wound Debridement up to 20 cm: < or equal to 20 cm $ Wound Debridement each add'l 20 sqcm: 1 $PT PLS Gun and Tip: 1 Supply $PT Hydrotherapy Visit: 1 Visit       Tessie Fass Tymika Grilli 12/29/2020, 10:55 AM 12/29/2020  Ginger Carne., PT Acute Rehabilitation Services [phone]  (pager) [phone]  (office)

## 2020-12-29 NOTE — Progress Notes (Signed)
Occupational Therapy Treatment Patient Details Name: [name] MRN: [id number] DOB: 1955-05-14 Today's Date: 12/29/2020    History of present illness Pt is a 65 y.o. male who presented 12/23/20 with generalized weakness and AMS. Per chart, pt's roommate called EMS after pt has been laying in bed for past 3 days, covered in feces and urine with worsening contractures. Pending work-up. MRI showed small remote cerebellar infarcts, but no acute or subacute insult. Pt with acute metabolic encephalopathy. Pt deveoloped hypoxi 9/6 and place on NRB.  PMH: MS, EtOH abuse, depression, BPD, prior GIB from gastric ulcer and esophagitis in 2019   OT comments  Patient session cleared for bed level HEP; PROM to bilateral upper and lower extremities.  Patient session kept at bed level per RN request.  Patient lethargic with eyes closed during majority of session.  Increased pain noted with L hip and shoulder PROM.  All HEP with pain tolerance.  Given level of impairment and needed assist, SNF is recommended.  OT will follow in the acute setting to maximize his functional status.    Follow Up Recommendations  SNF;Supervision/Assistance - 24 hour    Equipment Recommendations       Recommendations for Other Services      Precautions / Restrictions Precautions Precautions: Fall Precaution Comments: bil leg contractures; pressure ulcer R lateral hip; high risk for skin breakdown; decreased A/PROM Bil UEs       Mobility Bed Mobility     Rolling: Total assist         General bed mobility comments: Assist for all aspects of mobility Patient Response: Flat affect  Transfers                                                                 ADL either performed or assessed with clinical judgement   ADL                                         General ADL Comments: not attempted - patient lethargic                       Cognition  Arousal/Alertness: Lethargic;Suspect due to medications Behavior During Therapy: Flat affect Overall Cognitive Status: Difficult to assess                                          Exercises General Exercises - Upper Extremity Shoulder Flexion: PROM;Both;10 reps;Supine Shoulder ABduction: PROM;Both;10 reps;Supine Elbow Flexion: PROM;Both;10 reps;Supine Elbow Extension: PROM;Both;10 reps;Supine Wrist Flexion: PROM;Both;10 reps;Supine Digit Composite Flexion: PROM;Both;10 reps;Supine General Exercises - Lower Extremity Quad Sets: PROM;Both;10 reps;Supine Hip ABduction/ADduction: PROM;Both;10 reps;Supine   Shoulder Instructions       General Comments      Pertinent Vitals/ Pain       Pain Assessment: Faces Faces Pain Scale: Hurts even more Pain Location: L hip and L shoulder Pain Descriptors / Indicators: Grimacing;Guarding;Moaning Pain Intervention(s): Monitored during session  Frequency  Min 2X/week        Progress Toward Goals  OT Goals(current goals can now be found in the care plan section)     Acute Rehab OT Goals Patient Stated Goal: did not state OT Goal Formulation: Patient unable to participate in goal setting Time For Goal Achievement: 01/08/21 Potential to Achieve Goals: Fair ADL Goals Pt Will Perform Eating: with supervision;sitting  Plan Discharge plan remains appropriate    Co-evaluation                 AM-PAC OT "6 Clicks" Daily Activity     Outcome Measure   Help from another person eating meals?: Total Help from another person taking care of personal grooming?: Total Help from another person toileting, which includes using toliet, bedpan, or urinal?: Total Help from another person bathing (including washing, rinsing, drying)?: Total Help from another person to put on and taking off regular upper body clothing?: Total Help from another  person to put on and taking off regular lower body clothing?: Total 6 Click Score: 6    End of Session    OT Visit Diagnosis: Other abnormalities of gait and mobility (R26.89);Muscle weakness (generalized) (M62.81);Other symptoms and signs involving cognitive function;Pain   Activity Tolerance Patient limited by lethargy   Patient Left in bed;with call bell/phone within reach   Nurse Communication          Time: 1445-1457 OT Time Calculation (min): 12 min  Charges: OT General Charges $OT Visit: 1 Visit OT Treatments $Therapeutic Exercise: 8-22 mins  12/29/2020  RP, OTR/L  Acute Rehabilitation Services  Office:  [phone]    Suzanna Obey 12/29/2020, 3:09 PM

## 2020-12-30 ENCOUNTER — Other Ambulatory Visit (HOSPITAL_COMMUNITY): Payer: Self-pay

## 2020-12-30 ENCOUNTER — Inpatient Hospital Stay (HOSPITAL_COMMUNITY): Payer: Medicare Other

## 2020-12-30 DIAGNOSIS — J9601 Acute respiratory failure with hypoxia: Secondary | ICD-10-CM | POA: Diagnosis not present

## 2020-12-30 LAB — CBC
HCT: 28.4 % — ABNORMAL LOW (ref 39.0–52.0)
Hemoglobin: 9.1 g/dL — ABNORMAL LOW (ref 13.0–17.0)
MCH: 30.5 pg (ref 26.0–34.0)
MCHC: 32 g/dL (ref 30.0–36.0)
MCV: 95.3 fL (ref 80.0–100.0)
Platelets: 239 10*3/uL (ref 150–400)
RBC: 2.98 MIL/uL — ABNORMAL LOW (ref 4.22–5.81)
RDW: 14.2 % (ref 11.5–15.5)
WBC: 6.3 10*3/uL (ref 4.0–10.5)
nRBC: 0 % (ref 0.0–0.2)

## 2020-12-30 LAB — CULTURE, BLOOD (ROUTINE X 2)
Culture: NO GROWTH
Culture: NO GROWTH
Special Requests: ADEQUATE
Special Requests: ADEQUATE

## 2020-12-30 LAB — GLUCOSE, CAPILLARY
Glucose-Capillary: 99 mg/dL (ref 70–99)
Glucose-Capillary: 90 mg/dL (ref 70–99)
Glucose-Capillary: 135 mg/dL — ABNORMAL HIGH (ref 70–99)

## 2020-12-30 LAB — HEPARIN LEVEL (UNFRACTIONATED): Heparin Unfractionated: 0.36 IU/mL (ref 0.30–0.70)

## 2020-12-30 MED ORDER — ADULT MULTIVITAMIN W/MINERALS CH
1.0000 | ORAL_TABLET | Freq: Every day | ORAL | Status: DC
  Administered 2020-12-31 – 2021-01-04 (×5): 1
  Filled 2020-12-30 (×5): qty 1

## 2020-12-30 MED ORDER — POLYETHYLENE GLYCOL 3350 17 G PO PACK
17.0000 g | PACK | Freq: Every day | ORAL | Status: DC
  Administered 2020-12-31 – 2021-01-04 (×5): 17 g
  Filled 2020-12-30 (×4): qty 1

## 2020-12-30 MED ORDER — DOXYCYCLINE HYCLATE 100 MG PO TABS
100.0000 mg | ORAL_TABLET | Freq: Two times a day (BID) | ORAL | Status: DC
  Filled 2020-12-30: qty 1

## 2020-12-30 MED ORDER — VITAMIN A 3 MG (10000 UNIT) PO CAPS
10000.0000 [IU] | ORAL_CAPSULE | Freq: Every day | ORAL | Status: DC
  Administered 2020-12-31 – 2021-01-04 (×5): 10000 [IU]
  Filled 2020-12-30 (×5): qty 1

## 2020-12-30 MED ORDER — OLANZAPINE 10 MG PO TABS
10.0000 mg | ORAL_TABLET | Freq: Every day | ORAL | Status: DC
  Administered 2020-12-30 – 2021-01-03 (×5): 10 mg
  Filled 2020-12-30 (×6): qty 1

## 2020-12-30 MED ORDER — OSMOLITE 1.2 CAL PO LIQD
1000.0000 mL | ORAL | Status: DC
  Administered 2020-12-30: 1000 mL
  Filled 2020-12-30 (×9): qty 1000

## 2020-12-30 MED ORDER — APIXABAN 5 MG PO TABS: 5.0000 mg | ORAL_TABLET | Freq: Two times a day (BID) | ORAL | Status: DC

## 2020-12-30 MED ORDER — ASCORBIC ACID 500 MG PO TABS
500.0000 mg | ORAL_TABLET | Freq: Every day | ORAL | Status: DC
  Administered 2020-12-30: 500 mg via ORAL
  Filled 2020-12-30: qty 1

## 2020-12-30 MED ORDER — ASCORBIC ACID 500 MG PO TABS
500.0000 mg | ORAL_TABLET | Freq: Every day | ORAL | Status: DC
  Administered 2020-12-31 – 2021-01-04 (×5): 500 mg
  Filled 2020-12-30 (×5): qty 1

## 2020-12-30 MED ORDER — SERTRALINE HCL 25 MG PO TABS
25.0000 mg | ORAL_TABLET | Freq: Every day | ORAL | Status: DC
  Administered 2020-12-31 – 2021-01-04 (×5): 25 mg
  Filled 2020-12-30 (×5): qty 1

## 2020-12-30 MED ORDER — FOLIC ACID 1 MG PO TABS
1.0000 mg | ORAL_TABLET | Freq: Every day | ORAL | Status: DC
  Administered 2020-12-31 – 2021-01-04 (×5): 1 mg
  Filled 2020-12-30 (×5): qty 1

## 2020-12-30 MED ORDER — APIXABAN 5 MG PO TABS
10.0000 mg | ORAL_TABLET | Freq: Two times a day (BID) | ORAL | Status: DC
Start: 2020-12-30 — End: 2020-12-30
  Administered 2020-12-30: 10 mg via ORAL
  Filled 2020-12-30: qty 2

## 2020-12-30 MED ORDER — PROSOURCE TF PO LIQD
45.0000 mL | Freq: Two times a day (BID) | ORAL | Status: DC
  Administered 2020-12-30 – 2021-01-03 (×9): 45 mL
  Filled 2020-12-30 (×9): qty 45

## 2020-12-30 MED ORDER — DOXYCYCLINE HYCLATE 100 MG PO TABS
100.0000 mg | ORAL_TABLET | Freq: Two times a day (BID) | ORAL | Status: AC
  Administered 2020-12-30 – 2021-01-01 (×5): 100 mg
  Filled 2020-12-30 (×5): qty 1

## 2020-12-30 MED ORDER — VITAMIN A 3 MG (10000 UNIT) PO CAPS
10000.0000 [IU] | ORAL_CAPSULE | Freq: Every day | ORAL | Status: DC
  Administered 2020-12-30: 10000 [IU] via ORAL
  Filled 2020-12-30: qty 1

## 2020-12-30 MED ORDER — THIAMINE HCL 100 MG PO TABS
100.0000 mg | ORAL_TABLET | Freq: Every day | ORAL | Status: DC
  Administered 2020-12-31 – 2021-01-04 (×5): 100 mg
  Filled 2020-12-30 (×5): qty 1

## 2020-12-30 MED ORDER — APIXABAN 5 MG PO TABS
10.0000 mg | ORAL_TABLET | Freq: Two times a day (BID) | ORAL | Status: DC
Start: 2020-12-30 — End: 2021-01-04
  Administered 2020-12-30 – 2021-01-04 (×10): 10 mg
  Filled 2020-12-30 (×10): qty 2

## 2020-12-30 NOTE — Progress Notes (Signed)
PROGRESS NOTE    [name]   HEN:[id number]  DOB: 11/16/1955  PCP: Rinaldo Cloud, MD    DOA: 12/23/2020 LOS: 5    Brief Narrative / Hospital Course to Date:   [name] is a 65 year old male with past medical history significant for multiple sclerosis, EtOH abuse, bipolar disorder, history of GI bleed 2/2 gastric ulcer/esophagitis, tobacco use disorder who presented to [facility] ED via EMS for confusion and being found down by his roommate for likely 3 days.  Roommate reports generalized weakness and confusion for several days.  Apparently the patient was laying in his bed for 3 days covered in feces and urine; however refusing any type of assistance.  Per EMS, patient living in disheveled/poor environment; APS report was filed.  Admitted for further evaluation and management.   Patient had an aspiration event and was started on Unasyn.  Due to increasing dyspnea and oxygen requirement, CTA chest was obtained and showed bilateral acute PE's.  Started on heparin and PCCM was consulted.    Assessment & Plan   Principal Problem:   Acute respiratory failure with hypoxia (HCC) Active Problems:   MS (multiple sclerosis) (HCC)   Alcohol abuse   Bipolar I disorder (HCC)   Elevated brain natriuretic peptide (BNP) level   Acute encephalopathy   Polysubstance abuse (HCC)   Decubitus ulcer of hip   Rhabdomyolysis   Protein-calorie malnutrition, severe   Acute pulmonary embolism (HCC)   Drug abuse, marijuana   Aspiration pneumonia (HCC)   Acute respiratory failure with hypoxia due to Acute bilateral pulmonary emboli -with imaging evidence of right heart strain.  Felt too high risk for tPA or catheter-directed intervention.   Aspiration pneumonia with left basilar consolidation on imaging. Shock multifactorial septic vs circulatory  -- PCCM following, appreciate recommendations --Transition heparin drip >>Eliquis --On Unasyn and Doxy, Duonebs --Levophed per PCCM, maintain MAP>65  (weaned off today 9/8)  -- stable for transfer to PCU --Supplemental oxygen to maintain sats above 90%, wean as tolerated --Aspiration precautions  Acute metabolic encephalopathy -POA, improving Presented by EMS after being found down by roommate x3 days.  Associated generalized weakness.  Initially no evidence of infection, afebrile no leukocytosis.  UDS was positive for THC, EtOH less than 10.  CT head with cerebral atrophy but no acute findings.  UA negative.  Ammonia level normal.  Lactate normal.  Negative for COVID-19 and influenza A/B.  Unclear etiology but possibly due to alcohol abuse vs hypoxia given above. --may repeat MRI if not improving w/in next 24 hrs --Delirium precautions:     -Lights and TV off, minimize interruptions at night    -Blinds open and lights on during day    -Glasses/hearing aid with patient    -Frequent reorientation    -PT/OT when able    -Avoid sedation medications/Beers list medications  Rhabdomyolysis, mild, POA -patient was found down for 3 days.  Presented with a right hip wound and elevated CK20 476. --Treated with IV fluids now off --Supportive care and follow renal function  Right hip wound, POA -wound noted to have eschar blistering.  Seen by wound care.  Previously discussed with general surgery, recommended Santyl and hydrotherapy. -- PT following for hydrotherapy --Continue Santyl daily  Abnormal blood culture staff epidermidis -grew in 2/4 bottles of blood cultures from admission.  Suspect this is contamination.  Initially was started on bank prior to [facility] ID result, since discontinued. Patient remains afebrile with no leukocytosis --Follow repeat blood cultures of  9/4: No growth to date --Monitor clinically  Alcohol use disorder -uncertain type or quantity of alcohol.  On admission patient reported that he drinks "3 packs/day" --Monitor on CIWA protocol --Continue multivitamin folate and thiamine --TOC consulted for subs abuse  resources  Tobacco use disorder -reports he smokes 3 packs/day.  Counseled extensively on need for smoking cessation. --Nicotine patch  History of multiple sclerosis, relapsing remitting type -follows with neurology Dr. Epimenio Foot.  Last seen 05/05/2020.  On Tecfidera since 2013; patient reports not taking medications just prior to admission.  MRI brain with and without contrast showed advanced white matter disease that is progressed from 2019 image with no acute or subacute findings --Hold off on resuming Tecfidera since he has been noncompliant outpatient --Outpatient follow-up with neuro  History bipolar disorder History of depression with suicidal ideations -stable Home regimen appears to be sertraline 50 mg daily, olanzapine 10 mg at bedtime.  Recently noncompliant with any medications. --Resumed on sertraline 25 mg daily and olanzapine 10 mg nightly  Adult failure to thrive -patient presented via EMS after being found down times several days by roommate.  Per EMS report, house noted in deplorable unlivable conditions, patient with right hip wound and just shoveled, covered in urine and feces --APS report was filed and currently seeking court appointed guardianship --Requires 24 hour care, SNF placement pending --TOC following  Generalized weakness /physical debility /deconditioning  PT and OT recommend SNF placement.   TOC following.   Patient BMI: Body mass index is 20.32 kg/m.   DVT prophylaxis: SCDs Start: 12/24/20 0312 apixaban (ELIQUIS) tablet 10 mg  apixaban (ELIQUIS) tablet 5 mg   Diet:  Diet Orders (From admission, onward)     Start     Ordered   12/26/20 1349  DIET DYS 3 Room service appropriate? Yes with Assist; Fluid consistency: Thin  Diet effective now       Question Answer Comment  Room service appropriate? Yes with Assist   Fluid consistency: Thin      12/26/20 1348              Code Status: Full Code   Subjective 12/30/20    Patient seen in ICU this  morning, up in recliner.  CorTrak was placed this AM for tube feeds.  Pt asks when he will go home.  Appears to lack insight as to his condition and acute illness.  Denies pain, SOB, CP, N/V or other complaints.  No acute events reported.  Has been off Levophed overnight and this morning, since weaned off yesterday.  Disposition Plan & Communication   Status is: Inpatient  Remains inpatient appropriate because:Inpatient level of care appropriate due to severity of illness  Dispo: The patient is from: Home              Anticipated d/c is to: SNF              Patient currently is not medically stable to d/c.   Difficult to place patient No     Consults, Procedures, Significant Events   Consultants:  PCCM  Procedures:  None  Antimicrobials:  Anti-infectives (From admission, onward)    Start     Dose/Rate Route Frequency Ordered Stop   12/30/20 2200  doxycycline (VIBRA-TABS) tablet 100 mg        100 mg Oral Every 12 hours 12/30/20 1054 01/02/21 0959   12/29/20 1000  doxycycline (VIBRAMYCIN) 100 mg in sodium chloride 0.9 % 250 mL IVPB  100 mg 125 mL/hr over 120 Minutes Intravenous Every 12 hours 12/28/20 1349 12/30/20 1421   12/28/20 1215  azithromycin (ZITHROMAX) 500 mg in sodium chloride 0.9 % 250 mL IVPB  Status:  Discontinued        500 mg 250 mL/hr over 60 Minutes Intravenous Every 24 hours 12/28/20 1125 12/28/20 1349   12/27/20 0900  Ampicillin-Sulbactam (UNASYN) 3 g in sodium chloride 0.9 % 100 mL IVPB        3 g 200 mL/hr over 30 Minutes Intravenous Every 6 hours 12/27/20 0803 01/02/21 2359   12/25/20 1600  vancomycin (VANCOREADY) IVPB 1250 mg/250 mL  Status:  Discontinued        1,250 mg 166.7 mL/hr over 90 Minutes Intravenous Every 24 hours 12/24/20 1544 12/25/20 0727   12/24/20 1630  vancomycin (VANCOREADY) IVPB 1500 mg/300 mL        1,500 mg 150 mL/hr over 120 Minutes Intravenous  Once 12/24/20 1544 12/24/20 1859         Micro    Objective   Vitals:    12/30/20 1000 12/30/20 1128 12/30/20 1158 12/30/20 1300  BP: 124/69   95/69  Pulse: 79   98  Resp: (!) 27   19  Temp:  98.4 F (36.9 C)    TempSrc:  Axillary    SpO2: 96%  94% (!) 84%  Weight:      Height:        Intake/Output Summary (Last 24 hours) at 12/30/2020 1415 Last data filed at 12/30/2020 1300 Gross per 24 hour  Intake 1939.66 ml  Output 600 ml  Net 1339.66 ml   Filed Weights   12/23/20 2115 12/28/20 0205  Weight: 72.6 kg 66.1 kg    Physical Exam:  General exam: up in recliner, awake, alert, no acute distress, chronically ill-appearing, underweight HEENT: CorTrak NGT in place, High flow nasal cannula in place, moist mucus membranes Respiratory system: overall lungs clear, no wheezes or rhonchi, normal respiratory effort, on 8 L/min HFNC oxygen. Cardiovascular system: normal S1/S2, RRR, no pedal edema.   Central nervous system: normal speech with improved vocal projection, follows commands appropriately Extremities: no peripheral edema, right lateral hip dressing clean dry intact with no signs of surrounding erythema Psychiatry: normal mood, congruent affect, abnormal judgment and insight   Labs   Data Reviewed: I have personally reviewed following labs and imaging studies  CBC: Recent Labs  Lab 12/23/20 2137 12/27/20 2050 12/28/20 0055 12/29/20 0224 12/30/20 0305  WBC 7.1 9.4 9.6 10.3 6.3  NEUTROABS 5.7  --   --   --   --   HGB 11.8* 10.9* 11.3* 9.9* 9.1*  HCT 37.5* 32.4* 35.0* 30.4* 28.4*  MCV 97.4 91.5 93.3 93.0 95.3  PLT 241 230 222 241 239   Basic Metabolic Panel: Recent Labs  Lab 12/23/20 2137 12/24/20 0258 12/25/20 0024 12/28/20 0055 12/29/20 0224 12/29/20 0942  NA 138 138 140 142 144  --   K 5.1 4.6 4.0 4.0 3.8  --   CL 107 109 110 111 113*  --   CO2 19* 17* 20* 23 22  --   GLUCOSE 93 95 90 96 118*  --   BUN 27* 27* 22 17 18   --   CREATININE 1.24 1.07 0.95 1.09 1.01  --   CALCIUM 9.0 9.0 8.7* 8.2* 8.2*  --   MG  --  2.1  --  2.0  --   2.2  PHOS  --  3.6  --   --   --  3.4   GFR: Estimated Creatinine Clearance: 68.2 mL/min (by C-G formula based on SCr of 1.01 mg/dL). Liver Function Tests: Recent Labs  Lab 12/23/20 2137 12/24/20 0258 12/25/20 0024 12/28/20 0055  AST 74* 62* 43* 62*  ALT 35 32 27 33  ALKPHOS 68 71 63 80  BILITOT 1.5* 1.5* 1.5* 1.5*  PROT 6.5 6.6 5.7* 5.4*  ALBUMIN 3.0* 3.0* 2.5* 2.2*   No results for input(s): LIPASE, AMYLASE in the last 168 hours. Recent Labs  Lab 12/24/20 0428  AMMONIA 23   Coagulation Profile: Recent Labs  Lab 12/23/20 2137 12/24/20 0258  INR 1.0 1.0   Cardiac Enzymes: Recent Labs  Lab 12/23/20 2137  CKTOTAL 2,476*   BNP (last 3 results) No results for input(s): PROBNP in the last 8760 hours. HbA1C: No results for input(s): HGBA1C in the last 72 hours. CBG: Recent Labs  Lab 12/28/20 0204 12/28/20 0331  GLUCAP 91 99   Lipid Profile: No results for input(s): CHOL, HDL, LDLCALC, TRIG, CHOLHDL, LDLDIRECT in the last 72 hours. Thyroid Function Tests: Recent Labs    12/29/20 0942  TSH 1.618   Anemia Panel: Recent Labs    12/29/20 0942  VITAMINB12 476  FOLATE 13.6   Sepsis Labs: Recent Labs  Lab 12/23/20 2137 12/28/20 0434  LATICACIDVEN 1.8 1.4    Recent Results (from the past 240 hour(s))  Urine Culture     Status: None   Collection Time: 12/23/20  9:38 PM   Specimen: In/Out Cath Urine  Result Value Ref Range Status   Specimen Description IN/OUT CATH URINE  Final   Special Requests NONE  Final   Culture   Final    NO GROWTH Performed at [facility], [address] [place], [place] [address]    Report Status 12/25/2020 FINAL  Final  Culture, blood (Routine x 2)     Status: Abnormal   Collection Time: 12/23/20 10:15 PM   Specimen: BLOOD  Result Value Ref Range Status   Specimen Description BLOOD LEFT ANTECUBITAL  Final   Special Requests   Final    BOTTLES DRAWN AEROBIC AND ANAEROBIC Blood Culture adequate volume   Culture   Setup Time   Final    GRAM POSITIVE COCCI IN BOTH AEROBIC AND ANAEROBIC BOTTLES CRITICAL RESULT CALLED TO, READ BACK BY AND VERIFIED WITH: L CURRAN PHARMD @1706  12/24/20 EB    Culture (A)  Final    STAPHYLOCOCCUS HOMINIS STAPHYLOCOCCUS SIMULANS STAPHYLOCOCCUS EPIDERMIDIS THE SIGNIFICANCE OF ISOLATING THIS ORGANISM FROM A SINGLE SET OF BLOOD CULTURES WHEN MULTIPLE SETS ARE DRAWN IS UNCERTAIN. PLEASE NOTIFY THE MICROBIOLOGY DEPARTMENT WITHIN ONE WEEK IF SPECIATION AND SENSITIVITIES ARE REQUIRED. Performed at [facility], [address] [place], [place] [address]    Report Status 12/26/2020 FINAL  Final  Blood Culture ID Panel (Reflexed)     Status: Abnormal   Collection Time: 12/23/20 10:15 PM  Result Value Ref Range Status   Enterococcus faecalis NOT DETECTED NOT DETECTED Final   Enterococcus Faecium NOT DETECTED NOT DETECTED Final   Listeria monocytogenes NOT DETECTED NOT DETECTED Final   Staphylococcus species DETECTED (A) NOT DETECTED Final    Comment: CRITICAL RESULT CALLED TO, READ BACK BY AND VERIFIED WITH: L CURRAN PHARMD @1706  12/24/20 EB    Staphylococcus aureus (BCID) NOT DETECTED NOT DETECTED Final   Staphylococcus epidermidis DETECTED (A) NOT DETECTED Final    Comment: CRITICAL RESULT CALLED TO, READ BACK BY AND VERIFIED WITH: L CURRAN PHARMD @1706  12/24/20  EB    Staphylococcus lugdunensis NOT DETECTED NOT DETECTED Final   Streptococcus species NOT DETECTED NOT DETECTED Final   Streptococcus agalactiae NOT DETECTED NOT DETECTED Final   Streptococcus pneumoniae NOT DETECTED NOT DETECTED Final   Streptococcus pyogenes NOT DETECTED NOT DETECTED Final   A.calcoaceticus-baumannii NOT DETECTED NOT DETECTED Final   Bacteroides fragilis NOT DETECTED NOT DETECTED Final   Enterobacterales NOT DETECTED NOT DETECTED Final   Enterobacter cloacae complex NOT DETECTED NOT DETECTED Final   Escherichia coli NOT DETECTED NOT DETECTED Final   Klebsiella aerogenes NOT DETECTED NOT  DETECTED Final   Klebsiella oxytoca NOT DETECTED NOT DETECTED Final   Klebsiella pneumoniae NOT DETECTED NOT DETECTED Final   Proteus species NOT DETECTED NOT DETECTED Final   Salmonella species NOT DETECTED NOT DETECTED Final   Serratia marcescens NOT DETECTED NOT DETECTED Final   Haemophilus influenzae NOT DETECTED NOT DETECTED Final   Neisseria meningitidis NOT DETECTED NOT DETECTED Final   Pseudomonas aeruginosa NOT DETECTED NOT DETECTED Final   Stenotrophomonas maltophilia NOT DETECTED NOT DETECTED Final   Candida albicans NOT DETECTED NOT DETECTED Final   Candida auris NOT DETECTED NOT DETECTED Final   Candida glabrata NOT DETECTED NOT DETECTED Final   Candida krusei NOT DETECTED NOT DETECTED Final   Candida parapsilosis NOT DETECTED NOT DETECTED Final   Candida tropicalis NOT DETECTED NOT DETECTED Final   Cryptococcus neoformans/gattii NOT DETECTED NOT DETECTED Final   Methicillin resistance mecA/C NOT DETECTED NOT DETECTED Final    Comment: Performed at [facility], [address] [place], [place] [address]  Culture, blood (Routine x 2)     Status: None   Collection Time: 12/23/20 10:20 PM   Specimen: BLOOD RIGHT HAND  Result Value Ref Range Status   Specimen Description BLOOD RIGHT HAND  Final   Special Requests   Final    BOTTLES DRAWN AEROBIC AND ANAEROBIC Blood Culture adequate volume   Culture   Final    NO GROWTH 5 DAYS Performed at [facility], [address], [place], [place] [address]    Report Status 12/29/2020 FINAL  Final  Resp Panel by RT-PCR (Flu A&B, Covid) Nasopharyngeal Swab     Status: None   Collection Time: 12/23/20 10:30 PM   Specimen: Nasopharyngeal Swab; Nasopharyngeal(NP) swabs in vial transport medium  Result Value Ref Range Status   SARS Coronavirus 2 by RT PCR NEGATIVE NEGATIVE Final    Comment: (NOTE) SARS-CoV-2 target nucleic acids are NOT DETECTED.  The SARS-CoV-2 RNA is generally detectable in upper respiratory specimens  during the acute phase of infection. The lowest concentration of SARS-CoV-2 viral copies this assay can detect is 138 copies/mL. A negative result does not preclude SARS-Cov-2 infection and should not be used as the sole basis for treatment or other patient management decisions. A negative result may occur with  improper specimen collection/handling, submission of specimen other than nasopharyngeal swab, presence of viral mutation(s) within the areas targeted by this assay, and inadequate number of viral copies(<138 copies/mL). A negative result must be combined with clinical observations, patient history, and epidemiological information. The expected result is Negative.  Fact Sheet for Patients:  BloggerCourse.com  Fact Sheet for Healthcare Providers:  SeriousBroker.it  This test is no t yet approved or cleared by the [place] FDA and  has been authorized for detection and/or diagnosis of SARS-CoV-2 by FDA under an Emergency Use Authorization (EUA). This EUA will remain  in effect (meaning this test can be  used) for the duration of the COVID-19 declaration under Section 564(b)(1) of the Act, 21 U.S.C.section 360bbb-3(b)(1), unless the authorization is terminated  or revoked sooner.       Influenza A by PCR NEGATIVE NEGATIVE Final   Influenza B by PCR NEGATIVE NEGATIVE Final    Comment: (NOTE) The Xpert Xpress SARS-CoV-2/FLU/RSV plus assay is intended as an aid in the diagnosis of influenza from Nasopharyngeal swab specimens and should not be used as a sole basis for treatment. Nasal washings and aspirates are unacceptable for Xpert Xpress SARS-CoV-2/FLU/RSV testing.  Fact Sheet for Patients: BloggerCourse.com  Fact Sheet for Healthcare Providers: SeriousBroker.it  This test is not yet approved or cleared by the [place] FDA and has been authorized for detection  and/or diagnosis of SARS-CoV-2 by FDA under an Emergency Use Authorization (EUA). This EUA will remain in effect (meaning this test can be used) for the duration of the COVID-19 declaration under Section 564(b)(1) of the Act, 21 U.S.C. section 360bbb-3(b)(1), unless the authorization is terminated or revoked.  Performed at [facility], [address] [place], [place] [address]   Culture, blood (routine x 2)     Status: None   Collection Time: 12/25/20  8:06 AM   Specimen: BLOOD  Result Value Ref Range Status   Specimen Description BLOOD BLOOD RIGHT HAND  Final   Special Requests   Final    BOTTLES DRAWN AEROBIC AND ANAEROBIC Blood Culture adequate volume   Culture   Final    NO GROWTH 5 DAYS Performed at [facility], [address], [place], [place] [address]    Report Status 12/30/2020 FINAL  Final  Culture, blood (routine x 2)     Status: None   Collection Time: 12/25/20  8:07 AM   Specimen: BLOOD  Result Value Ref Range Status   Specimen Description BLOOD BLOOD RIGHT HAND  Final   Special Requests   Final    BOTTLES DRAWN AEROBIC AND ANAEROBIC Blood Culture adequate volume   Culture   Final    NO GROWTH 5 DAYS Performed at [facility], [address] [place], [place] [address]    Report Status 12/30/2020 FINAL  Final  MRSA Next Gen by PCR, Nasal     Status: None   Collection Time: 12/28/20  2:05 AM   Specimen: Nasal Mucosa; Nasal Swab  Result Value Ref Range Status   MRSA by PCR Next Gen NOT DETECTED NOT DETECTED Final    Comment: (NOTE) The GeneXpert MRSA Assay (FDA approved for NASAL specimens only), is one component of a comprehensive MRSA colonization surveillance program. It is not intended to diagnose MRSA infection nor to guide or monitor treatment for MRSA infections. Test performance is not FDA approved in patients less than 26 years old. Performed at [facility], [address], [place], [place] [address]       Imaging  Studies   DG Abd Portable 1V  Result Date: 12/30/2020 CLINICAL DATA:  Feeding tube placement. EXAM: PORTABLE ABDOMEN - 1 VIEW COMPARISON:  December 24, 2020. FINDINGS: The bowel gas pattern is normal. Distal tip of feeding tube is seen in expected position of distal stomach. No radio-opaque calculi or other significant radiographic abnormality are seen. IMPRESSION: Distal tip of feeding tube seen in expected position of distal stomach. Electronically Signed   By: Lupita Raider M.D.   On: 12/30/2020 12:34   VAS [place] LOWER EXTREMITY VENOUS (DVT)  Result Date: 12/29/2020  Lower Venous DVT Study Patient Name:  [name]  Date of Exam:   12/29/2020 Medical Rec #: [id number]     Accession #:    [id number] Date of Birth: January 04, 1956     Patient Gender: M Patient Age:   77 years Exam Location:  [facility] Procedure:      VAS [place] LOWER EXTREMITY VENOUS (DVT) Referring Phys: Melody Comas --------------------------------------------------------------------------------  Indications: PE with acute respiratory failure/hypoxia.  Comparison Study: Previous exam 12/10/2017 LLE - negative Performing Technologist: Jody Hill RVT, RDMS  Examination Guidelines: A complete evaluation includes B-mode imaging, spectral Doppler, color Doppler, and power Doppler as needed of all accessible portions of each vessel. Bilateral testing is considered an integral part of a complete examination. Limited examinations for reoccurring indications may be performed as noted. The reflux portion of the exam is performed with the patient in reverse Trendelenburg.  +---------+---------------+---------+-----------+----------+--------------+ RIGHT    CompressibilityPhasicitySpontaneityPropertiesThrombus Aging +---------+---------------+---------+-----------+----------+--------------+ CFV      Full           Yes      Yes                                 +---------+---------------+---------+-----------+----------+--------------+ SFJ       Full                                                        +---------+---------------+---------+-----------+----------+--------------+ FV Prox  Full           Yes      Yes                                 +---------+---------------+---------+-----------+----------+--------------+ FV Mid   Full           Yes      Yes                                 +---------+---------------+---------+-----------+----------+--------------+ FV DistalFull           Yes      Yes                                 +---------+---------------+---------+-----------+----------+--------------+ PFV      Full                                                        +---------+---------------+---------+-----------+----------+--------------+ POP      Full           Yes      Yes                                 +---------+---------------+---------+-----------+----------+--------------+ PTV  Not visualized +---------+---------------+---------+-----------+----------+--------------+ PERO                                                  Not visualized +---------+---------------+---------+-----------+----------+--------------+   Right Technical Findings: Not visualized segments include peroneal and posterior tibial veins. PeroV and PTVs not visualized due to patient being contracted and laying on side.  +---------+---------------+---------+-----------+----------+--------------+ LEFT     CompressibilityPhasicitySpontaneityPropertiesThrombus Aging +---------+---------------+---------+-----------+----------+--------------+ CFV      Full           Yes      Yes                                 +---------+---------------+---------+-----------+----------+--------------+ SFJ      Full                                                        +---------+---------------+---------+-----------+----------+--------------+ FV Prox  Full            Yes      Yes                                 +---------+---------------+---------+-----------+----------+--------------+ FV Mid   Full           Yes      Yes                                 +---------+---------------+---------+-----------+----------+--------------+ FV DistalFull           Yes      Yes                                 +---------+---------------+---------+-----------+----------+--------------+ PFV      Full                                                        +---------+---------------+---------+-----------+----------+--------------+ POP      Full           Yes      Yes                                 +---------+---------------+---------+-----------+----------+--------------+ PTV      Full                                                        +---------+---------------+---------+-----------+----------+--------------+ PERO     Full                                                        +---------+---------------+---------+-----------+----------+--------------+  Summary: BILATERAL: - No evidence of deep vein thrombosis seen in the lower extremities, bilaterally. 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 -No evidence of popliteal cyst, bilaterally. RIGHT: - There is no evidence of deep vein thrombosis in the lower extremity. However, portions of this examination were limited- see technologist comments above.   *See table(s) above for measurements and observations.    Preliminary      Medications   Scheduled Meds:  apixaban  10 mg Oral BID   Followed by   Melene Muller ON 01/06/2021] apixaban  5 mg Oral BID   Chlorhexidine Gluconate Cloth  6 each Topical Q0600   collagenase   Topical Daily   doxycycline  100 mg Oral Q12H   feeding supplement  237 mL Oral TID BM   feeding supplement (PROSource TF)  45 mL Per Tube BID   folic acid  1 mg Oral Daily   Gerhardt's butt cream   Topical TID    ipratropium-albuterol  3 mL Nebulization BID   mouth rinse  15 mL Mouth Rinse BID   multivitamin with minerals  1 tablet Oral Daily   OLANZapine zydis  10 mg Oral QHS   polyethylene glycol  17 g Oral Daily   sertraline  25 mg Oral Daily   thiamine  100 mg Oral Daily   Continuous Infusions:  sodium chloride 10 mL/hr at 12/30/20 1300   ampicillin-sulbactam (UNASYN) IV Stopped (12/30/20 1009)   doxycycline (VIBRAMYCIN) IV 125 mL/hr at 12/30/20 1300   feeding supplement (OSMOLITE 1.2 CAL)         LOS: 5 days    Time spent: 25 minutes    Pennie Banter, DO [facility]  12/30/2020, 2:15 PM      If 7PM-7AM, please contact night-coverage. How to contact the [facility] Attending or Consulting provider 7A - 7P or covering provider during after hours 7P -7A, for this patient?    Check the care team in [facility] and look for a) attending/consulting TRH provider listed and b) the [facility] team listed Log into [email or web address] and use [place]'s universal password to access. If you do not have the password, please contact the hospital operator. Locate the [facility] provider you are looking for under [facility] and page to a number that you can be directly reached. If you still have difficulty reaching the provider, please page the [facility] (Director on Call) for the Hospitalists listed on amion for assistance.

## 2020-12-30 NOTE — Progress Notes (Addendum)
Nutrition Follow-up   DOCUMENTATION CODES:   Severe malnutrition in context of chronic illness  INTERVENTION:   Initiate tube feeding via Cortrak tube: Osmolite 1.2 at 30 ml/h, increase by 10 ml every 4 hours to goal rate of 70 ml/h (1680 ml per day) Prosource TF 45 ml BID  Provides 2096 kcal, 115 gm protein, 1378 ml free water daily  Monitor magnesium, potassium, and phosphorus BID for at least 2-3 days, MD to replete as needed, as pt is at risk for refeeding syndrome given severe malnutrition with recent minimal oral intake.   Continue Dysphagia 3 diet with thin liquids.  Continue Ensure Enlive po TID, each supplement provides 350 kcal and 20 grams of protein.  Continue Magic cup TID with meals, each supplement provides 290 kcal and 9 grams of protein.  Vitamin C supplementation 300-1,000 mg/day for one month.  Vitamin A supplementation 5,000-15,000 units per day orally.  Continue MVI with minerals, folic acid, and thiamine.  NUTRITION DIAGNOSIS:   Severe Malnutrition related to chronic illness (MS, ETOH) as evidenced by severe fat depletion, severe muscle depletion.  Ongoing  GOAL:   Patient will meet greater than or equal to 90% of their needs  Progressing  MONITOR:   PO intake, Supplement acceptance, Labs, Skin, TF tolerance  REASON FOR ASSESSMENT:   Consult Assessment of nutrition requirement/status, Poor PO  ASSESSMENT:   Pt with PMH of multiple sclerosis, ETOH abuse, bipolar disorder, GI bleed 2/2 gastric ulcer/esophagitis, tobacco abuse admitted after being found down by his roommate. Pt lying in his bed for 3 days covered in feces and urine but refusing care.    Discussed patient in ICU rounds and with RN today. Patient remains confused. Received hydrotherapy with PT this morning.   Remains on a dysphagia 3 diet with thin liquids. Meal intakes documented at 0-15% since admission, except for breakfast yesterday (55%, took RN an hour to feed). Poor  intake related to decreased alertness. He is drinking some Ensure supplements; also receiving Magic cup supplement with meals.   Cortrak placed this morning d/t ongoing poor intake with malnutrition. Tip of the Cortrak is in the distal stomach. RD to start tube feeding.   Medications reviewed and include: folic acid, MVI with minerals, Miralax, thiamine.  Labs reviewed. Phos 3.4 and Mag 2.2 WNL this morning  Vitamin / Mineral Profile: Vitamin A: 8 (L) Vitamin C: < 0.1 (L) Zinc: 49 (low end of normal) Copper: 161 (H) Folate: 13.6 WNL Vitamin B-12: 476 WNL  Pharmacy to add vitamin A and vitamin C supplementation.   Diet Order:   Diet Order             DIET DYS 3 Room service appropriate? Yes with Assist; Fluid consistency: Thin  Diet effective now                   EDUCATION NEEDS:   Not appropriate for education at this time  Skin:  Skin Assessment: Skin Integrity Issues: Skin Integrity Issues:: Unstageable Unstageable: R hip  Last BM:  9/2  Height:   Ht Readings from Last 1 Encounters:  12/23/20 5\' 11"  (1.803 m)    Weight:   Wt Readings from Last 1 Encounters:  12/28/20 66.1 kg    BMI:  Body mass index is 20.32 kg/m.  Estimated Nutritional Needs:   Kcal:  2000-2200  Protein:  110-125 grams  Fluid:  > 2 L/day   02/27/21, RD, LDN, CNSC Please refer to Amion for contact information.

## 2020-12-30 NOTE — Progress Notes (Addendum)
ANTICOAGULATION CONSULT NOTE - Follow Up Consult  Pharmacy Consult for Eliquis Indication: pulmonary embolus  No Known Allergies  Patient Measurements: Height: 5\' 11"  (180.3 cm) Weight: 66.1 kg (145 lb 11.6 oz) IBW/kg (Calculated) : 75.3  Vital Signs: Temp: 98.4 F (36.9 C) (09/09 0300) Temp Source: Oral (09/09 0300) BP: 113/69 (09/09 0545) Pulse Rate: 77 (09/09 0600)  Labs: Recent Labs    12/28/20 0055 12/28/20 0129 12/28/20 0401 12/28/20 0800 12/29/20 0224 12/29/20 0942 12/30/20 0305  HGB 11.3*  --   --   --  9.9*  --  9.1*  HCT 35.0*  --   --   --  30.4*  --  28.4*  PLT 222  --   --   --  241  --  239  HEPARINUNFRC  --   --   --    < > 0.31 0.36 0.36  CREATININE 1.09  --   --   --  1.01  --   --   TROPONINIHS  --  61* 66*  --   --   --   --    < > = values in this interval not displayed.     Estimated Creatinine Clearance: 68.2 mL/min (by C-G formula based on SCr of 1.01 mg/dL).   Medications:  Scheduled:   Chlorhexidine Gluconate Cloth  6 each Topical Q0600   collagenase   Topical Daily   feeding supplement  237 mL Oral TID BM   folic acid  1 mg Oral Daily   Gerhardt's butt cream   Topical TID   ipratropium-albuterol  3 mL Nebulization BID   mouth rinse  15 mL Mouth Rinse BID   multivitamin with minerals  1 tablet Oral Daily   OLANZapine zydis  10 mg Oral QHS   polyethylene glycol  17 g Oral Daily   sertraline  25 mg Oral Daily   thiamine  100 mg Oral Daily   Infusions:   sodium chloride 10 mL/hr at 12/30/20 0600   ampicillin-sulbactam (UNASYN) IV Stopped (12/30/20 03/01/21)   doxycycline (VIBRAMYCIN) IV Stopped (12/29/20 2343)   heparin 1,700 Units/hr (12/30/20 0600)   norepinephrine (LEVOPHED) Adult infusion Stopped (12/29/20 2052)   PRN: acetaminophen, acetaminophen, fentaNYL (SUBLIMAZE) injection, ondansetron **OR** ondansetron (ZOFRAN) IV  Assessment: 65 yo male admitted on 9/3 with AMS and aspiration pneumonia.  On 9/6, patient developed  progressively increasing oxygen requirement, CTA chest revealed acute submassive PE with evidence of RHS.  Patient was started on heparin on 9/6.   Per IR, overall clot burden deemed small in volume with respiratory status likely due to near complete consolidation and collapse of the left lower lobe.  Not planning intervention.   Heparin level this morning therapeutic at 0.36, on 1700 units/hr.  Per CCM, will transition from heparin to apixaban today.  Pharmacy consulted for apixaban dosing.  Renal function stable, Hgb slightly down at 9.1.  No signs/symptoms of bleeding noted per RN.   Goal of Therapy:  Monitor platelets by anticoagulation protocol: Yes   Plan:  Stop heparin infusion ~10am, then start apixaban 10 mg PO BID x 7 days, followed by 5 mg PO BID. Monitor CBC and for signs/symptoms of bleeding.    11/6, PharmD PGY1 Pharmacy Resident Phone [phone] 12/30/2020 6:48 AM   Please check AMION for all [facility] Pharmacy phone numbers After 10:00 PM, call Main Pharmacy [phone]   [phone], PharmD Clinical Pharmacist  Please check AMION for all [facility] Pharmacy numbers After 10:00 PM, call  Main Pharmacy [phone]

## 2020-12-30 NOTE — Progress Notes (Signed)
NAME:  [name], MRN:  [id number], DOB:  Oct 14, 1955, LOS: 5 ADMISSION DATE:  12/23/2020, CONSULTATION DATE:  12/30/20 REFERRING MD:  Chotiner, CHIEF COMPLAINT:  submassive PE  History of Present Illness:  Mr. [name] Distance is a 65 y.o. M with PMH significant for MS, ETOH abuse, GIB (duodenal ulcer 2019) who was admitted on 12/24/20 after being found by his roommate laying in bed for possibly three days covered in feces.  He was admitted with aspiration pneumonia and treated with Unasyn.  On 9/6 he went from room air to 10L HFNC and then overnight sats dropped to 87-88% on 15L NRB and 15L HFNC with worsening mental status prompting CTA chest which read likely submassive PE with RV/LV ratio of 1.3, therefore PCCM consulted.  Pertinent  Medical History   has a past medical history of Depression, Mental disorder, Movement disorder, MS (multiple sclerosis) (HCC), Multiple sclerosis, relapsing-remitting (HCC), Neuromuscular disorder (HCC), and Vision abnormalities.   Significant Hospital Events: Including procedures, antibiotic start and stop dates in addition to other pertinent events   9/3 Admit to hospitalists, unasyn initiated 9/7 Worsening respiratory and mental status with CTA chest finding of Positive for acute PE with CT evidence of right heart strain (RV/LV Ratio = 1.34) consistent with at least submassive (intermediate risk) PE.   Collapse and consolidation of the left lower lobe. Per IR, no plans for procedure -     9/9- BLE Doppler negative  Interim History / Subjective:  Patient oxygen need increased overnight. He is now requiring 8L HFNC. Levophed stopped at 2000 on 9/8. Difficult to understand but follows commands.   Objective   Blood pressure 113/69, pulse 77, temperature 98 F (36.7 C), temperature source Axillary, resp. rate 19, height 5\' 11"  (1.803 m), weight 66.1 kg, SpO2 92 %.        Intake/Output Summary (Last 24 hours) at 12/30/2020 0843 Last data filed at 12/30/2020 0600 Gross  per 24 hour  Intake 2492.78 ml  Output 950 ml  Net 1542.78 ml   Filed Weights   12/23/20 2115 12/28/20 0205  Weight: 72.6 kg 66.1 kg   General:  thin, poorly nourished M, tachypneic but not in severe distress, awake and trying to communicate HEENT: MM pink/moist Neuro: Oriented to place, attempts to talk but difficult to understand, calm, no tremors, sluggish but follows commands CV: s1s2 rrr, no m/r/g PULM:  decreased air movement LLL  Extremities: warm/dry, no edema  Skin: no rashes or lesions  Resolved Hospital Problem list   Rhabdo  Assessment & Plan:   Acute Hypoxic Respiratory Failure secondary to suspected aspiration PNA with LLL collapse and pulmonary embolism Shock -Patient is still tachypneic with significant oxygen requirement but GCS >8. Minimal risk for intubation at this point in illness.  - Continue HFNC, wean to O2 sat >90 - Per IR, no indication for intervention - Continue Unasyn for 7 days ( 9/6 - 9/12) - Continue azithromycin for 5 days ( 9/8 - 9/12) - continue Duo-Nebs BID for underlying lung disease - Switch heparin to eliquis today to be continued on discharge - f/u xray for changes to LLL consolidation  Acute Encephalopathy, ETOH use disorder POA, History of ETOH use disorder MR brain with no acute findings 9/3 -Continue to monitor with improving oxygen needs - Call family/ roommate for collateral about baseline mentation before repeating imaging  History of MS, relapsing remitting  On [employer] outpatient P: -follow up with Neurology outpatient  R Hip wound POA -continue Santyl and  hydrotherapy - care per wound care   Bipolar Disorder hx depression and SI with failure to thrive protein calorie malnutrition and failure to thrive -PT/OT recommending SNF placement and adult protective services seeking court appointed guardian -Dietitian following. Continue dysphagia diet - Plan to place cortrak today since not able to meet nutritional  needs  Constipation No bowel movement since admission.  - added mirlax daily  Best Practice (right click and "Reselect all SmartList Selections" daily)   Diet/type: dysphagia diet (see orders) DVT prophylaxis: DOAC GI prophylaxis: N/A Lines: N/A Foley:  N/A Code Status:  full code Last date of multidisciplinary goals of care discussion []   Labs   CBC: Recent Labs  Lab 12/23/20 2137 12/27/20 2050 12/28/20 0055 12/29/20 0224 12/30/20 0305  WBC 7.1 9.4 9.6 10.3 6.3  NEUTROABS 5.7  --   --   --   --   HGB 11.8* 10.9* 11.3* 9.9* 9.1*  HCT 37.5* 32.4* 35.0* 30.4* 28.4*  MCV 97.4 91.5 93.3 93.0 95.3  PLT 241 230 222 241 239    Basic Metabolic Panel: Recent Labs  Lab 12/23/20 2137 12/24/20 0258 12/25/20 0024 12/28/20 0055 12/29/20 0224 12/29/20 0942  NA 138 138 140 142 144  --   K 5.1 4.6 4.0 4.0 3.8  --   CL 107 109 110 111 113*  --   CO2 19* 17* 20* 23 22  --   GLUCOSE 93 95 90 96 118*  --   BUN 27* 27* 22 17 18   --   CREATININE 1.24 1.07 0.95 1.09 1.01  --   CALCIUM 9.0 9.0 8.7* 8.2* 8.2*  --   MG  --  2.1  --  2.0  --  2.2  PHOS  --  3.6  --   --   --  3.4   GFR: Estimated Creatinine Clearance: 68.2 mL/min (by C-G formula based on SCr of 1.01 mg/dL). Recent Labs  Lab 12/23/20 2137 12/27/20 2050 12/28/20 0055 12/28/20 0434 12/29/20 0224 12/30/20 0305  WBC 7.1 9.4 9.6  --  10.3 6.3  LATICACIDVEN 1.8  --   --  1.4  --   --     Liver Function Tests: Recent Labs  Lab 12/23/20 2137 12/24/20 0258 12/25/20 0024 12/28/20 0055  AST 74* 62* 43* 62*  ALT 35 32 27 33  ALKPHOS 68 71 63 80  BILITOT 1.5* 1.5* 1.5* 1.5*  PROT 6.5 6.6 5.7* 5.4*  ALBUMIN 3.0* 3.0* 2.5* 2.2*   No results for input(s): LIPASE, AMYLASE in the last 168 hours. Recent Labs  Lab 12/24/20 0428  AMMONIA 23    ABG    Component Value Date/Time   PHART 7.503 (H) 12/27/2020 2130   PCO2ART 29.2 (L) 12/27/2020 2130   PO2ART 47.9 (L) 12/27/2020 2130   HCO3 22.4 12/27/2020 2130    TCO2 22.8 10/30/2011 0540   ACIDBASEDEF 0.2 12/27/2020 2130   O2SAT 80.6 12/27/2020 2130     Coagulation Profile: Recent Labs  Lab 12/23/20 2137 12/24/20 0258  INR 1.0 1.0    Cardiac Enzymes: Recent Labs  Lab 12/23/20 2137  CKTOTAL 2,476*    HbA1C: Hgb A1c MFr Bld  Date/Time Value Ref Range Status  05/06/2009 06:05 AM  4.6 - 6.1 % Final   5.6 (NOTE) The ADA recommends the following therapeutic goal for glycemic control related to Hgb A1c measurement: Goal of therapy: <6.5 Hgb A1c  Reference: American Diabetes Association: Clinical Practice Recommendations 2010, Diabetes Care, 2010, 33: (Suppl  1).  CBG: Recent Labs  Lab 12/28/20 0204 12/28/20 0331  GLUCAP 91 99    Critical care time:    Lita Mains, MS4

## 2020-12-30 NOTE — Progress Notes (Signed)
Physical Therapy Wound Treatment Patient Details  Name: HAIDAN NHAN MRN: [id number] Date of Birth: [birth year]  Today's Date: 12/30/2020 Time: 1015-1040 Time Calculation (min): 25 min  Subjective  Subjective Assessment Subjective: pt able to express his need for pain meds/intolerance for the pain during PLS and debridement Patient and Family Stated Goals: None stated Date of Onset:  (Unknown - PTA) Prior Treatments: Dressing changes  Pain Score:  premedicated with fentanyl,  still 4/10 pain with debridement  Wound Assessment  Pressure Injury 12/24/20 Hip Right Unstageable - Full thickness tissue loss in which the base of the injury is covered by slough (yellow, tan, gray, green or brown) and/or eschar (tan, brown or black) in the wound bed. Black (eschar) surrounded by yellow (Active)  Wound Image   12/26/20 1121  Dressing Type Gauze (Comment) 12/30/20 1115  Dressing Changed;Clean;Dry;Intact 12/30/20 1115  Dressing Change Frequency Twice a day 12/30/20 1115  State of Healing Eschar 12/30/20 1115  Site / Wound Assessment Brown;Pink;Red;Yellow 12/30/20 1115  % Wound base Red or Granulating 20% 12/30/20 1115  % Wound base Yellow/Fibrinous Exudate 80% 12/30/20 1115  % Wound base Black/Eschar 0% 12/30/20 1115  % Wound base Other/Granulation Tissue (Comment) 0% 12/30/20 1115  Peri-wound Assessment Intact 12/29/20 1047  Wound Length (cm) 8 cm 12/26/20 1045  Wound Width (cm) 4 cm 12/26/20 1045  Wound Depth (cm) 0 cm 12/26/20 1045  Wound Surface Area (cm^2) 32 cm^2 12/26/20 1045  Wound Volume (cm^3) 0 cm^3 12/26/20 1045  Tunneling (cm) 0 12/26/20 1121  Undermining (cm) 0 12/26/20 1121  Margins Unattached edges (unapproximated) 12/30/20 1115  Drainage Amount Minimal 12/30/20 1115  Drainage Description Serous;No odor 12/30/20 1115  Treatment Cleansed;Debridement (Selective);Hydrotherapy (Pulse lavage);Packing (Saline gauze) 12/30/20 1115      Hydrotherapy Pulsed lavage therapy - wound  location: R hip Pulsed Lavage with Suction (psi): 4 psi (to 8) Pulsed Lavage with Suction - Normal Saline Used: 1000 mL Pulsed Lavage Tip: Tip with splash shield Selective Debridement Selective Debridement - Location: R hip Selective Debridement - Tools Used: Forceps, Scalpel, Scissors Selective Debridement - Tissue Removed: yellow, brown nonviable tissue    Wound Assessment and Plan  Wound Therapy - Assess/Plan/Recommendations Wound Therapy - Clinical Statement: Continue to work on debriding necrotic rt hip wound. Will continue hydrotherapy to facilitate removal of necrotic tissue. Wound Therapy - Functional Problem List: Decreased tolerance for positioning OOB due to MS and wound. Factors Delaying/Impairing Wound Healing: Immobility, Multiple medical problems Hydrotherapy Plan: Debridement, Dressing change, Patient/family education, Pulsatile lavage with suction Wound Therapy - Frequency: 6X / week Wound Therapy - Follow Up Recommendations: dressing changes by RN  Wound Therapy Goals- Improve the function of patient's integumentary system by progressing the wound(s) through the phases of wound healing (inflammation - proliferation - remodeling) by: Wound Therapy Goals - Improve the function of patient's integumentary system by progressing the wound(s) through the phases of wound healing by: Decrease Necrotic Tissue to: 20% Decrease Necrotic Tissue - Progress: Progressing toward goal Increase Granulation Tissue to: 80% Increase Granulation Tissue - Progress: Progressing toward goal Goals/treatment plan/discharge plan were made with and agreed upon by patient/family: No, Patient unable to participate in goals/treatment/discharge plan and family unavailable Time For Goal Achievement: 7 days Wound Therapy - Potential for Goals: Good  Goals will be updated until maximal potential achieved or discharge criteria met.  Discharge criteria: when goals achieved, discharge from hospital, MD  decision/surgical intervention, no progress towards goals, refusal/missing three consecutive treatments without notification or medical reason.  GP     Charges PT Wound Care Charges $Wound Debridement up to 20 cm: < or equal to 20 cm $PT Hydrotherapy Dressing: 1 dressing $PT PLS Gun and Tip: 1 Supply $PT Hydrotherapy Visit: 1 Visit     12/30/2020  Ginger Carne., PT Acute Rehabilitation Services [phone]  (pager) [phone]  (office)   Tessie Fass  12/30/2020, 11:18 AM

## 2020-12-30 NOTE — TOC Benefit Eligibility Note (Signed)
Patient Product/process development scientist completed.    The patient is currently admitted and upon discharge could be taking Eliquis 5 mg.  The current 30 day co-pay is, $142.00 due to a $95.00 deductible remaining.  Will be $47.00 a month after meeting deductible.   The patient is insured through [employer] Medicare Part D     Roland Earl, CPhT Pharmacy Patient Advocate Specialist [facility] Antimicrobial Stewardship Team Direct Number: [phone]  Fax: [phone]

## 2020-12-30 NOTE — Procedures (Signed)
Cortrak  Person Inserting Tube:  Elianis Fischbach E, RD Tube Type:  Cortrak - 43 inches Tube Size:  10 Tube Location:  Left nare Initial Placement:  Stomach Secured by: Bridle Technique Used to Measure Tube Placement:  Marking at nare/corner of mouth Cortrak Secured At:  69 cm  Cortrak Tube Team Note:  Consult received to place a Cortrak feeding tube.   X-ray is required, abdominal x-ray has been ordered by the Cortrak team. Please confirm tube placement before using the Cortrak tube.   If the tube becomes dislodged please keep the tube and contact the Cortrak team at [email or web address] (password [email or web address]) for replacement.  If after hours and replacement cannot be delayed, place a NG tube and confirm placement with an abdominal x-ray.    Vivian Neuwirth, MS, RD, LDN (she/her/hers) RD pager number and weekend/on-call pager number located in Amion.   

## 2020-12-30 NOTE — Progress Notes (Signed)
CPT held at this time. Pt asleep in bedside chair. RT will continue to monitor.

## 2020-12-30 NOTE — Progress Notes (Signed)
Palliative Medicine RN Note: Consult order noted for GOC. During chart review, I see pt has an APS worker assigned, [name], who is petitioning for guardianship. I have placed his contact information in the demographics tab. Per TOC, APS is available for emergencies only over the w/e, so it is likely we won't be able to complete this consult until Monday (no PMT provider is immediately available, and he can't be placed until guardian is assigned regardless).  [name] Humberto Addo, RN, BSN, [facility] Palliative Medicine Team 12/30/2020 1:20 PM Office [phone]

## 2020-12-30 NOTE — Progress Notes (Signed)
Physical Therapy Treatment Patient Details Name: [name] MRN: [id number] DOB: [birth year] Today's Date: 12/30/2020    History of Present Illness Pt is a 65 y.o. male who presented 12/23/20 with generalized weakness and AMS. Per chart, pt's roommate called EMS after pt has been laying in bed for past 3 days, covered in feces and urine with worsening contractures. Pending work-up. MRI showed small remote cerebellar infarcts, but no acute or subacute insult. Pt with acute metabolic encephalopathy. Pt deveoloped hypoxi 9/6 and place on NRB.  PMH: MS, EtOH abuse, depression, BPD, prior GIB from gastric ulcer and esophagitis in 2019    PT Comments    Pt has made limited progress toward goals due to level of arousal, pain and contracture.  Emphasis on transitions to EOB, prolonged stretch of hamstring contracture, UE AAROM exercise and transfer to the recliner to work on OOB sitting tolerance.    Follow Up Recommendations  SNF;Supervision/Assistance - 24 hour     Equipment Recommendations  Wheelchair (measurements PT);Wheelchair cushion (measurements PT);Other (comment) (TBD)    Recommendations for Other Services       Precautions / Restrictions Precautions Precautions: Fall    Mobility  Bed Mobility Overal bed mobility: Needs Assistance Bed Mobility: Supine to Sit Rolling: Total assist         General bed mobility comments: Assist for all aspects of mobility    Transfers Overall transfer level: Needs assistance   Transfers: Stand Pivot Transfers   Stand pivot transfers: Total assist;+2 physical assistance       General transfer comment: Pt did not or unable to assist with tranfer to chair and placed on lift padding  Ambulation/Gait             General Gait Details: Not able   Stairs             Wheelchair Mobility    Modified Rankin (Stroke Patients Only) Modified Rankin (Stroke Patients Only) Modified Rankin: Severe disability     Balance  Overall balance assessment: Needs assistance Sitting-balance support: Feet supported;No upper extremity supported;Single extremity supported Sitting balance-Leahy Scale: Poor Sitting balance - Comments: sat >10 in sitting working on balance, but also on prolonged knee extension, simultaneously.                                    Cognition Arousal/Alertness: Lethargic;Awake/alert Behavior During Therapy: Flat affect Overall Cognitive Status: Difficult to assess (NT formally, but definiely confused)                                        Exercises Other Exercises Other Exercises: Hamstrick stetches to approx -10* terminal ext. for >10 min Other Exercises: bil elbow/shd flex/ext x15 in sitting.    General Comments General comments (skin integrity, edema, etc.): VSS in general,  poor pleth overall so difficult to confirm SpO2, but usually in the low 90's.  BP soft, but adequate in the 100's / 50's      Pertinent Vitals/Pain Pain Assessment: Faces Faces Pain Scale: Hurts little more Pain Location: legs with strethch, hips in general Pain Intervention(s): Monitored during session    Home Living                      Prior Function  PT Goals (current goals can now be found in the care plan section) Acute Rehab PT Goals Patient Stated Goal: did not state PT Goal Formulation: With patient Time For Goal Achievement: 01/07/21 Potential to Achieve Goals: Fair Progress towards PT goals: Progressing toward goals    Frequency    Min 2X/week      PT Plan Current plan remains appropriate    Co-evaluation              AM-PAC PT "6 Clicks" Mobility   Outcome Measure  Help needed turning from your back to your side while in a flat bed without using bedrails?: Total Help needed moving from lying on your back to sitting on the side of a flat bed without using bedrails?: Total Help needed moving to and from a bed to a chair  (including a wheelchair)?: Total Help needed standing up from a chair using your arms (e.g., wheelchair or bedside chair)?: Total Help needed to walk in hospital room?: Total Help needed climbing 3-5 steps with a railing? : Total 6 Click Score: 6    End of Session Equipment Utilized During Treatment: Oxygen Activity Tolerance: Patient limited by fatigue;Patient limited by pain Patient left: in chair;with call bell/phone within reach;with chair alarm set;Other (comment) (on maxisky lift pad.) Nurse Communication: Mobility status;Need for lift equipment PT Visit Diagnosis: Unsteadiness on feet (R26.81);Other abnormalities of gait and mobility (R26.89);Muscle weakness (generalized) (M62.81) Pain - Right/Left:  (bil) Pain - part of body:  (hips and proximal legs)     Time: 3009-2330 PT Time Calculation (min) (ACUTE ONLY): 32 min  Charges:  $Therapeutic Exercise: 8-22 mins $Therapeutic Activity: 8-22 mins                     12/30/2020  Jacinto Halim., PT Acute Rehabilitation Services [phone]  (pager) [phone]  (office)   [name] 12/30/2020, 12:57 PM

## 2020-12-30 NOTE — Progress Notes (Signed)
CPT held at this time. Pt up to bedside chair. RT will continue to monitor.

## 2020-12-31 LAB — BASIC METABOLIC PANEL
Anion gap: 7 (ref 5–15)
BUN: 25 mg/dL — ABNORMAL HIGH (ref 8–23)
CO2: 21 mmol/L — ABNORMAL LOW (ref 22–32)
Calcium: 8.3 mg/dL — ABNORMAL LOW (ref 8.9–10.3)
Chloride: 120 mmol/L — ABNORMAL HIGH (ref 98–111)
Creatinine, Ser: 0.84 mg/dL (ref 0.61–1.24)
GFR, Estimated: >60 mL/min (ref >60)
Glucose, Bld: 156 mg/dL — ABNORMAL HIGH (ref 70–99)
Potassium: 3.9 mmol/L (ref 3.5–5.1)
Sodium: 148 mmol/L — ABNORMAL HIGH (ref 135–145)

## 2020-12-31 LAB — PHOSPHORUS: Phosphorus: 3.2 mg/dL (ref 2.5–4.6)

## 2020-12-31 LAB — CBC
HCT: 29.7 % — ABNORMAL LOW (ref 39.0–52.0)
Hemoglobin: 9.3 g/dL — ABNORMAL LOW (ref 13.0–17.0)
MCH: 29.9 pg (ref 26.0–34.0)
MCHC: 31.3 g/dL (ref 30.0–36.0)
MCV: 95.5 fL (ref 80.0–100.0)
Platelets: 285 10*3/uL (ref 150–400)
RBC: 3.11 MIL/uL — ABNORMAL LOW (ref 4.22–5.81)
RDW: 14.3 % (ref 11.5–15.5)
WBC: 5 10*3/uL (ref 4.0–10.5)
nRBC: 0 % (ref 0.0–0.2)

## 2020-12-31 LAB — GLUCOSE, CAPILLARY
Glucose-Capillary: 146 mg/dL — ABNORMAL HIGH (ref 70–99)
Glucose-Capillary: 131 mg/dL — ABNORMAL HIGH (ref 70–99)
Glucose-Capillary: 145 mg/dL — ABNORMAL HIGH (ref 70–99)
Glucose-Capillary: 130 mg/dL — ABNORMAL HIGH (ref 70–99)

## 2020-12-31 LAB — MAGNESIUM: Magnesium: 2.1 mg/dL (ref 1.7–2.4)

## 2020-12-31 MED ORDER — FENTANYL CITRATE PF 50 MCG/ML IJ SOSY: 50.0000 ug | PREFILLED_SYRINGE | INTRAMUSCULAR | Status: DC | PRN

## 2020-12-31 MED ORDER — FREE WATER
200.0000 mL | Freq: Four times a day (QID) | Status: DC
  Administered 2020-12-31 – 2021-01-01 (×4): 200 mL

## 2020-12-31 MED ORDER — FENTANYL CITRATE PF 50 MCG/ML IJ SOSY
50.0000 ug | PREFILLED_SYRINGE | INTRAMUSCULAR | Status: DC | PRN
  Administered 2021-01-02: 50 ug via INTRAVENOUS
  Filled 2020-12-31: qty 1

## 2020-12-31 NOTE — Progress Notes (Signed)
PROGRESS NOTE    [name]   ZOX:[id number]  DOB: 1956/02/03  PCP: Rinaldo Cloud, MD    DOA: 12/23/2020 LOS: 6    Brief Narrative / Hospital Course to Date:   [name] is a 65 year old male with past medical history significant for multiple sclerosis, EtOH abuse, bipolar disorder, history of GI bleed 2/2 gastric ulcer/esophagitis, tobacco use disorder who presented to [facility] ED via EMS for confusion and being found down by his roommate for likely 3 days.  Roommate reports generalized weakness and confusion for several days.  Apparently the patient was laying in his bed for 3 days covered in feces and urine; however refusing any type of assistance.  Per EMS, patient living in disheveled/poor environment; APS report was filed.  Admitted for further evaluation and management.   Patient had an aspiration event and was started on Unasyn.  Due to increasing dyspnea and oxygen requirement, CTA chest was obtained and showed bilateral acute PE's.  Started on heparin and PCCM was consulted.    Assessment & Plan   Principal Problem:   Acute respiratory failure with hypoxia (HCC) Active Problems:   MS (multiple sclerosis) (HCC)   Alcohol abuse   Bipolar I disorder (HCC)   Elevated brain natriuretic peptide (BNP) level   Acute encephalopathy   Polysubstance abuse (HCC)   Decubitus ulcer of hip   Rhabdomyolysis   Protein-calorie malnutrition, severe   Acute pulmonary embolism (HCC)   Drug abuse, marijuana   Aspiration pneumonia (HCC)   Acute respiratory failure with hypoxia due to Acute bilateral pulmonary emboli -with imaging evidence of right heart strain.  Felt too high risk for tPA or catheter-directed intervention.   Aspiration pneumonia with left basilar consolidation on imaging. Shock multifactorial septic vs circulatory.  Required Levophed, weaned off and hemodynamically stable;  Ongoing soft BP's but maintaining MAP -- PCCM consulted --Transitioned off heparin drip  >>Eliquis --On Unasyn and Doxy, Duonebs --Supplemental oxygen to maintain sats above 90%, wean as tolerated --Aspiration precautions  Hypernatremia - Na 148 this AM (9/10).  Free water flushes ordered.  Monitor BMP and adjust free water accordingly.   Acute metabolic encephalopathy -POA, improving Presented by EMS after being found down by roommate x3 days.  Associated generalized weakness.  Initially no evidence of infection, afebrile no leukocytosis.  UDS was positive for THC, EtOH less than 10.  CT head with cerebral atrophy but no acute findings.  UA negative.  Ammonia level normal.  Lactate normal.  Negative for COVID-19 and influenza A/B.  Unclear etiology but possibly due to alcohol abuse vs hypoxia given above. --may repeat MRI if not improving w/in next 24 hrs --Delirium precautions:     -Lights and TV off, minimize interruptions at night    -Blinds open and lights on during day    -Glasses/hearing aid with patient    -Frequent reorientation    -PT/OT when able    -Avoid sedation medications/Beers list medications  Rhabdomyolysis, mild, POA -patient was found down for 3 days.  Presented with a right hip wound and elevated CK2476. --Treated with IV fluids now off --Supportive care and follow renal function  Right hip wound, POA -wound noted to have eschar blistering.  Seen by wound care.  Previously discussed with general surgery, recommended Santyl and hydrotherapy. -- PT following for hydrotherapy --Continue Santyl daily  Severe malnutrition - POA related to chronic illness MS, EtOH.  Pt has severe fat depletion and muscle atrophy. Dietitian consulted. Body mass index is  20.32 kg/m. Dietary supplements, tube feeds, vitamins per RD.  Abnormal blood culture staff epidermidis -grew in 2/4 bottles of blood cultures from admission.  Suspect this is contamination.  Initially was started on bank prior to [facility] ID result, since discontinued. Patient remains afebrile with no  leukocytosis --Follow repeat blood cultures of 9/4: No growth to date --Monitor clinically  Alcohol use disorder -uncertain type or quantity of alcohol.  On admission patient reported that he drinks "3 packs/day" --Monitor on CIWA protocol --Continue multivitamin folate and thiamine --TOC consulted for subs abuse resources  Tobacco use disorder -reports he smokes 3 packs/day.  Counseled extensively on need for smoking cessation. --Nicotine patch  History of multiple sclerosis, relapsing remitting type -follows with neurology Dr. Epimenio Foot.  Last seen 05/05/2020.  On Tecfidera since 2013; patient reports not taking medications just prior to admission.  MRI brain with and without contrast showed advanced white matter disease that is progressed from 2019 image with no acute or subacute findings --Hold off on resuming Tecfidera since he has been noncompliant outpatient --Outpatient follow-up with neuro  History bipolar disorder History of depression with suicidal ideations -stable Home regimen appears to be sertraline 50 mg daily, olanzapine 10 mg at bedtime.  Recently noncompliant with any medications. --Resumed on sertraline 25 mg daily and olanzapine 10 mg nightly  Adult failure to thrive -patient presented via EMS after being found down times several days by roommate.  Per EMS report, house noted in deplorable unlivable conditions, patient with right hip wound and just shoveled, covered in urine and feces --APS report was filed and currently seeking court appointed guardianship --Requires 24 hour care, SNF placement pending --TOC following  Generalized weakness /physical debility /deconditioning  PT and OT recommend SNF placement.   TOC following.   Patient BMI: Body mass index is 20.32 kg/m.   DVT prophylaxis: SCDs Start: 12/24/20 0312 apixaban (ELIQUIS) tablet 10 mg  apixaban (ELIQUIS) tablet 5 mg   Diet:  Diet Orders (From admission, onward)     Start     Ordered   12/26/20  1349  DIET DYS 3 Room service appropriate? Yes with Assist; Fluid consistency: Thin  Diet effective now       Question Answer Comment  Room service appropriate? Yes with Assist   Fluid consistency: Thin      12/26/20 1348              Code Status: Full Code   Subjective 12/31/20    Patient transferred out of ICU.  Sleeping soundly when seen today.  Wakes briefly to voice during exam but quickly falls asleep again.  No acute events reported.  Appears comfortable.  Mittens on hands due to pulling at lines and tubes when awake.   Disposition Plan & Communication   Status is: Inpatient  Remains inpatient appropriate because:Inpatient level of care appropriate due to severity of illness, remains encephalopathic and requiring artifical nutrition to meet needs.    Dispo: The patient is from: Home              Anticipated d/c is to: SNF              Patient currently is not medically stable to d/c.   Difficult to place patient No     Consults, Procedures, Significant Events   Consultants:  PCCM  Procedures:  None  Antimicrobials:  Anti-infectives (From admission, onward)    Start     Dose/Rate Route Frequency Ordered Stop   12/30/20 2200  doxycycline (VIBRA-TABS) tablet 100 mg  Status:  Discontinued        100 mg Oral Every 12 hours 12/30/20 1054 12/30/20 1629   12/30/20 2200  doxycycline (VIBRA-TABS) tablet 100 mg        100 mg Per Tube Every 12 hours 12/30/20 1629 01/02/21 0959   12/29/20 1000  doxycycline (VIBRAMYCIN) 100 mg in sodium chloride 0.9 % 250 mL IVPB        100 mg 125 mL/hr over 120 Minutes Intravenous Every 12 hours 12/28/20 1349 12/31/20 1215   12/28/20 1215  azithromycin (ZITHROMAX) 500 mg in sodium chloride 0.9 % 250 mL IVPB  Status:  Discontinued        500 mg 250 mL/hr over 60 Minutes Intravenous Every 24 hours 12/28/20 1125 12/28/20 1349   12/27/20 0900  Ampicillin-Sulbactam (UNASYN) 3 g in sodium chloride 0.9 % 100 mL IVPB        3 g 200 mL/hr  over 30 Minutes Intravenous Every 6 hours 12/27/20 0803 01/02/21 2359   12/25/20 1600  vancomycin (VANCOREADY) IVPB 1250 mg/250 mL  Status:  Discontinued        1,250 mg 166.7 mL/hr over 90 Minutes Intravenous Every 24 hours 12/24/20 1544 12/25/20 0727   12/24/20 1630  vancomycin (VANCOREADY) IVPB 1500 mg/300 mL        1,500 mg 150 mL/hr over 120 Minutes Intravenous  Once 12/24/20 1544 12/24/20 1859         Micro    Objective   Vitals:   12/31/20 1149 12/31/20 1236 12/31/20 1420 12/31/20 1606  BP: 118/70 110/63 105/65 104/61  Pulse: 98 97 (!) 101 91  Resp: (!) 27 (!) 29 (!) 31 (!) 26  Temp: 99.6 F (37.6 C) 99.9 F (37.7 C) 99.8 F (37.7 C) 98.3 F (36.8 C)  TempSrc: Axillary Oral Axillary Axillary  SpO2: 94% 94% 94% 96%  Weight:      Height:        Intake/Output Summary (Last 24 hours) at 12/31/2020 1652 Last data filed at 12/31/2020 1420 Gross per 24 hour  Intake 1039.36 ml  Output 800 ml  Net 239.36 ml   Filed Weights   12/23/20 2115 12/28/20 0205  Weight: 72.6 kg 66.1 kg    Physical Exam:  General exam: sleeping comfortably, no acute distress, chronically ill-appearing, underweight HEENT: CorTrak NGT in place Respiratory system: CTAB diminished throughout, normal respiratory effort, on 4 L/min supplemental oxygen. Cardiovascular system: normal S1/S2, RRR, no pedal edema.   Central nervous system: unable to exam, pt somnolent and not following commands Extremities: soft mitten restraints on BLE's, no peripheral edema Psychiatry: unable to exam, pt somnolent and not following commands   Labs   Data Reviewed: I have personally reviewed following labs and imaging studies  CBC: Recent Labs  Lab 12/27/20 2050 12/28/20 0055 12/29/20 0224 12/30/20 0305 12/31/20 0041  WBC 9.4 9.6 10.3 6.3 5.0  HGB 10.9* 11.3* 9.9* 9.1* 9.3*  HCT 32.4* 35.0* 30.4* 28.4* 29.7*  MCV 91.5 93.3 93.0 95.3 95.5  PLT 230 222 241 239 285   Basic Metabolic Panel: Recent Labs   Lab 12/25/20 0024 12/28/20 0055 12/29/20 0224 12/29/20 0942 12/31/20 0041  NA 140 142 144  --  148*  K 4.0 4.0 3.8  --  3.9  CL 110 111 113*  --  120*  CO2 20* 23 22  --  21*  GLUCOSE 90 96 118*  --  156*  BUN 22 17 18   --  25*  CREATININE 0.95 1.09 1.01  --  0.84  CALCIUM 8.7* 8.2* 8.2*  --  8.3*  MG  --  2.0  --  2.2 2.1  PHOS  --   --   --  3.4 3.2   GFR: Estimated Creatinine Clearance: 82 mL/min (by C-G formula based on SCr of 0.84 mg/dL). Liver Function Tests: Recent Labs  Lab 12/25/20 0024 12/28/20 0055  AST 43* 62*  ALT 27 33  ALKPHOS 63 80  BILITOT 1.5* 1.5*  PROT 5.7* 5.4*  ALBUMIN 2.5* 2.2*   No results for input(s): LIPASE, AMYLASE in the last 168 hours. No results for input(s): AMMONIA in the last 168 hours.  Coagulation Profile: No results for input(s): INR, PROTIME in the last 168 hours.  Cardiac Enzymes: No results for input(s): CKTOTAL, CKMB, CKMBINDEX, TROPONINI in the last 168 hours.  BNP (last 3 results) No results for input(s): PROBNP in the last 8760 hours. HbA1C: No results for input(s): HGBA1C in the last 72 hours. CBG: Recent Labs  Lab 12/30/20 2304 12/31/20 0458 12/31/20 0800 12/31/20 1148 12/31/20 1608  GLUCAP 135* 146* 131* 145* 130*   Lipid Profile: No results for input(s): CHOL, HDL, LDLCALC, TRIG, CHOLHDL, LDLDIRECT in the last 72 hours. Thyroid Function Tests: Recent Labs    12/29/20 0942  TSH 1.618   Anemia Panel: Recent Labs    12/29/20 0942  VITAMINB12 476  FOLATE 13.6   Sepsis Labs: Recent Labs  Lab 12/28/20 0434  LATICACIDVEN 1.4    Recent Results (from the past 240 hour(s))  Urine Culture     Status: None   Collection Time: 12/23/20  9:38 PM   Specimen: In/Out Cath Urine  Result Value Ref Range Status   Specimen Description IN/OUT CATH URINE  Final   Special Requests NONE  Final   Culture   Final    NO GROWTH Performed at [facility], [address], [place], [place] [address]     Report Status 12/25/2020 FINAL  Final  Culture, blood (Routine x 2)     Status: Abnormal   Collection Time: 12/23/20 10:15 PM   Specimen: BLOOD  Result Value Ref Range Status   Specimen Description BLOOD LEFT ANTECUBITAL  Final   Special Requests   Final    BOTTLES DRAWN AEROBIC AND ANAEROBIC Blood Culture adequate volume   Culture  Setup Time   Final    GRAM POSITIVE COCCI IN BOTH AEROBIC AND ANAEROBIC BOTTLES CRITICAL RESULT CALLED TO, READ BACK BY AND VERIFIED WITH: L CURRAN PHARMD @1706  12/24/20 EB    Culture (A)  Final    STAPHYLOCOCCUS HOMINIS STAPHYLOCOCCUS SIMULANS STAPHYLOCOCCUS EPIDERMIDIS THE SIGNIFICANCE OF ISOLATING THIS ORGANISM FROM A SINGLE SET OF BLOOD CULTURES WHEN MULTIPLE SETS ARE DRAWN IS UNCERTAIN. PLEASE NOTIFY THE MICROBIOLOGY DEPARTMENT WITHIN ONE WEEK IF SPECIATION AND SENSITIVITIES ARE REQUIRED. Performed at [facility], [address] [place], [place]    Report Status 12/26/2020 FINAL  Final  Blood Culture ID Panel (Reflexed)     Status: Abnormal   Collection Time: 12/23/20 10:15 PM  Result Value Ref Range Status   Enterococcus faecalis NOT DETECTED NOT DETECTED Final   Enterococcus Faecium NOT DETECTED NOT DETECTED Final   Listeria monocytogenes NOT DETECTED NOT DETECTED Final   Staphylococcus species DETECTED (A) NOT DETECTED Final    Comment: CRITICAL RESULT CALLED TO, READ BACK BY AND VERIFIED WITH: L CURRAN PHARMD @1706  12/24/20 EB    Staphylococcus aureus (BCID)  NOT DETECTED NOT DETECTED Final   Staphylococcus epidermidis DETECTED (A) NOT DETECTED Final    Comment: CRITICAL RESULT CALLED TO, READ BACK BY AND VERIFIED WITH: L CURRAN PHARMD @1706  12/24/20 EB    Staphylococcus lugdunensis NOT DETECTED NOT DETECTED Final   Streptococcus species NOT DETECTED NOT DETECTED Final   Streptococcus agalactiae NOT DETECTED NOT DETECTED Final   Streptococcus pneumoniae NOT DETECTED NOT DETECTED Final   Streptococcus pyogenes NOT DETECTED NOT  DETECTED Final   A.calcoaceticus-baumannii NOT DETECTED NOT DETECTED Final   Bacteroides fragilis NOT DETECTED NOT DETECTED Final   Enterobacterales NOT DETECTED NOT DETECTED Final   Enterobacter cloacae complex NOT DETECTED NOT DETECTED Final   Escherichia coli NOT DETECTED NOT DETECTED Final   Klebsiella aerogenes NOT DETECTED NOT DETECTED Final   Klebsiella oxytoca NOT DETECTED NOT DETECTED Final   Klebsiella pneumoniae NOT DETECTED NOT DETECTED Final   Proteus species NOT DETECTED NOT DETECTED Final   Salmonella species NOT DETECTED NOT DETECTED Final   Serratia marcescens NOT DETECTED NOT DETECTED Final   Haemophilus influenzae NOT DETECTED NOT DETECTED Final   Neisseria meningitidis NOT DETECTED NOT DETECTED Final   Pseudomonas aeruginosa NOT DETECTED NOT DETECTED Final   Stenotrophomonas maltophilia NOT DETECTED NOT DETECTED Final   Candida albicans NOT DETECTED NOT DETECTED Final   Candida auris NOT DETECTED NOT DETECTED Final   Candida glabrata NOT DETECTED NOT DETECTED Final   Candida krusei NOT DETECTED NOT DETECTED Final   Candida parapsilosis NOT DETECTED NOT DETECTED Final   Candida tropicalis NOT DETECTED NOT DETECTED Final   Cryptococcus neoformans/gattii NOT DETECTED NOT DETECTED Final   Methicillin resistance mecA/C NOT DETECTED NOT DETECTED Final    Comment: Performed at [facility], [address] [place], [place] [address]  Culture, blood (Routine x 2)     Status: None   Collection Time: 12/23/20 10:20 PM   Specimen: BLOOD RIGHT HAND  Result Value Ref Range Status   Specimen Description BLOOD RIGHT HAND  Final   Special Requests   Final    BOTTLES DRAWN AEROBIC AND ANAEROBIC Blood Culture adequate volume   Culture   Final    NO GROWTH 5 DAYS Performed at [facility], [address], [place], [place] [address]    Report Status 12/29/2020 FINAL  Final  Resp Panel by RT-PCR (Flu A&B, Covid) Nasopharyngeal Swab     Status: None   Collection Time:  12/23/20 10:30 PM   Specimen: Nasopharyngeal Swab; Nasopharyngeal(NP) swabs in vial transport medium  Result Value Ref Range Status   SARS Coronavirus 2 by RT PCR NEGATIVE NEGATIVE Final    Comment: (NOTE) SARS-CoV-2 target nucleic acids are NOT DETECTED.  The SARS-CoV-2 RNA is generally detectable in upper respiratory specimens during the acute phase of infection. The lowest concentration of SARS-CoV-2 viral copies this assay can detect is 138 copies/mL. A negative result does not preclude SARS-Cov-2 infection and should not be used as the sole basis for treatment or other patient management decisions. A negative result may occur with  improper specimen collection/handling, submission of specimen other than nasopharyngeal swab, presence of viral mutation(s) within the areas targeted by this assay, and inadequate number of viral copies(<138 copies/mL). A negative result must be combined with clinical observations, patient history, and epidemiological information. The expected result is Negative.  Fact Sheet for Patients:  BloggerCourse.com  Fact Sheet for Healthcare Providers:  SeriousBroker.it  This test is no t yet approved or cleared by the [place]  [place] FDA and  has been authorized for detection and/or diagnosis of SARS-CoV-2 by FDA under an Emergency Use Authorization (EUA). This EUA will remain  in effect (meaning this test can be used) for the duration of the COVID-19 declaration under Section 564(b)(1) of the Act, 21 U.S.C.section 360bbb-3(b)(1), unless the authorization is terminated  or revoked sooner.       Influenza A by PCR NEGATIVE NEGATIVE Final   Influenza B by PCR NEGATIVE NEGATIVE Final    Comment: (NOTE) The Xpert Xpress SARS-CoV-2/FLU/RSV plus assay is intended as an aid in the diagnosis of influenza from Nasopharyngeal swab specimens and should not be used as a sole basis for treatment. Nasal washings  and aspirates are unacceptable for Xpert Xpress SARS-CoV-2/FLU/RSV testing.  Fact Sheet for Patients: BloggerCourse.com  Fact Sheet for Healthcare Providers: SeriousBroker.it  This test is not yet approved or cleared by the [place] FDA and has been authorized for detection and/or diagnosis of SARS-CoV-2 by FDA under an Emergency Use Authorization (EUA). This EUA will remain in effect (meaning this test can be used) for the duration of the COVID-19 declaration under Section 564(b)(1) of the Act, 21 U.S.C. section 360bbb-3(b)(1), unless the authorization is terminated or revoked.  Performed at [facility], [address], [place], [place] [address]   Culture, blood (routine x 2)     Status: None   Collection Time: 12/25/20  8:06 AM   Specimen: BLOOD  Result Value Ref Range Status   Specimen Description BLOOD BLOOD RIGHT HAND  Final   Special Requests   Final    BOTTLES DRAWN AEROBIC AND ANAEROBIC Blood Culture adequate volume   Culture   Final    NO GROWTH 5 DAYS Performed at [facility], [address], [place], [place] [address]    Report Status 12/30/2020 FINAL  Final  Culture, blood (routine x 2)     Status: None   Collection Time: 12/25/20  8:07 AM   Specimen: BLOOD  Result Value Ref Range Status   Specimen Description BLOOD BLOOD RIGHT HAND  Final   Special Requests   Final    BOTTLES DRAWN AEROBIC AND ANAEROBIC Blood Culture adequate volume   Culture   Final    NO GROWTH 5 DAYS Performed at [facility], [address] [place], [place] [address]    Report Status 12/30/2020 FINAL  Final  MRSA Next Gen by PCR, Nasal     Status: None   Collection Time: 12/28/20  2:05 AM   Specimen: Nasal Mucosa; Nasal Swab  Result Value Ref Range Status   MRSA by PCR Next Gen NOT DETECTED NOT DETECTED Final    Comment: (NOTE) The GeneXpert MRSA Assay (FDA approved for NASAL specimens only), is one  component of a comprehensive MRSA colonization surveillance program. It is not intended to diagnose MRSA infection nor to guide or monitor treatment for MRSA infections. Test performance is not FDA approved in patients less than 30 years old. Performed at [facility], [address] [place], [place] [address]       Imaging Studies   DG Abd Portable 1V  Result Date: 12/30/2020 CLINICAL DATA:  Feeding tube placement. EXAM: PORTABLE ABDOMEN - 1 VIEW COMPARISON:  December 24, 2020. FINDINGS: The bowel gas pattern is normal. Distal tip of feeding tube is seen in expected position of distal stomach. No radio-opaque calculi or other significant radiographic abnormality are seen. IMPRESSION: Distal tip of feeding tube seen  in expected position of distal stomach. Electronically Signed   By: Lupita Raider M.D.   On: 12/30/2020 12:34   VAS [place] LOWER EXTREMITY VENOUS (DVT)  Result Date: 12/30/2020  Lower Venous DVT Study Patient Name:  [name]  Date of Exam:   12/29/2020 Medical Rec #: [id number]     Accession #:    [id number] Date of Birth: 1956/04/06     Patient Gender: M Patient Age:   10 years Exam Location:  [facility] Procedure:      VAS [place] LOWER EXTREMITY VENOUS (DVT) Referring Phys: Melody Comas --------------------------------------------------------------------------------  Indications: PE with acute respiratory failure/hypoxia.  Comparison Study: Previous exam 12/10/2017 LLE - negative Performing Technologist: Jody Hill RVT, RDMS  Examination Guidelines: A complete evaluation includes B-mode imaging, spectral Doppler, color Doppler, and power Doppler as needed of all accessible portions of each vessel. Bilateral testing is considered an integral part of a complete examination. Limited examinations for reoccurring indications may be performed as noted. The reflux portion of the exam is performed with the patient in reverse Trendelenburg.   +---------+---------------+---------+-----------+----------+--------------+ RIGHT    CompressibilityPhasicitySpontaneityPropertiesThrombus Aging +---------+---------------+---------+-----------+----------+--------------+ CFV      Full           Yes      Yes                                 +---------+---------------+---------+-----------+----------+--------------+ SFJ      Full                                                        +---------+---------------+---------+-----------+----------+--------------+ FV Prox  Full           Yes      Yes                                 +---------+---------------+---------+-----------+----------+--------------+ FV Mid   Full           Yes      Yes                                 +---------+---------------+---------+-----------+----------+--------------+ FV DistalFull           Yes      Yes                                 +---------+---------------+---------+-----------+----------+--------------+ PFV      Full                                                        +---------+---------------+---------+-----------+----------+--------------+ POP      Full           Yes      Yes                                 +---------+---------------+---------+-----------+----------+--------------+ PTV  Not visualized +---------+---------------+---------+-----------+----------+--------------+ PERO                                                  Not visualized +---------+---------------+---------+-----------+----------+--------------+   Right Technical Findings: Not visualized segments include peroneal and posterior tibial veins. PeroV and PTVs not visualized due to patient being contracted and laying on side.  +---------+---------------+---------+-----------+----------+--------------+ LEFT     CompressibilityPhasicitySpontaneityPropertiesThrombus Aging  +---------+---------------+---------+-----------+----------+--------------+ CFV      Full           Yes      Yes                                 +---------+---------------+---------+-----------+----------+--------------+ SFJ      Full                                                        +---------+---------------+---------+-----------+----------+--------------+ FV Prox  Full           Yes      Yes                                 +---------+---------------+---------+-----------+----------+--------------+ FV Mid   Full           Yes      Yes                                 +---------+---------------+---------+-----------+----------+--------------+ FV DistalFull           Yes      Yes                                 +---------+---------------+---------+-----------+----------+--------------+ PFV      Full                                                        +---------+---------------+---------+-----------+----------+--------------+ POP      Full           Yes      Yes                                 +---------+---------------+---------+-----------+----------+--------------+ PTV      Full                                                        +---------+---------------+---------+-----------+----------+--------------+ PERO     Full                                                        +---------+---------------+---------+-----------+----------+--------------+  Summary: BILATERAL: - No evidence of deep vein thrombosis seen in the lower extremities, bilaterally. 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 -No evidence of popliteal cyst, bilaterally. RIGHT: - There is no evidence of deep vein thrombosis in the lower extremity. However, portions of this examination were limited- see technologist comments above.   *See table(s) above for measurements and observations. Electronically signed by Heath Lark on  12/30/2020 at 4:38:17 PM.    Final      Medications   Scheduled Meds:  apixaban  10 mg Per Tube BID   Followed by   Melene Muller ON 01/06/2021] apixaban  5 mg Oral BID   vitamin C  500 mg Per Tube Daily   Chlorhexidine Gluconate Cloth  6 each Topical Q0600   collagenase   Topical Daily   doxycycline  100 mg Per Tube Q12H   feeding supplement  237 mL Oral TID BM   feeding supplement (PROSource TF)  45 mL Per Tube BID   folic acid  1 mg Per Tube Daily   free water  200 mL Per Tube Q6H   Gerhardt's butt cream   Topical TID   ipratropium-albuterol  3 mL Nebulization BID   mouth rinse  15 mL Mouth Rinse BID   multivitamin with minerals  1 tablet Per Tube Daily   OLANZapine  10 mg Per Tube QHS   polyethylene glycol  17 g Per Tube Daily   sertraline  25 mg Per Tube Daily   thiamine  100 mg Per Tube Daily   vitamin A  10,000 Units Per Tube Daily   Continuous Infusions:  sodium chloride 10 mL/hr at 12/31/20 1102   ampicillin-sulbactam (UNASYN) IV 3 g (12/31/20 1542)   feeding supplement (OSMOLITE 1.2 CAL) 70 mL/hr at 12/31/20 1337       LOS: 6 days    Time spent: 25 minutes    Pennie Banter, DO [facility]  12/31/2020, 4:52 PM      If 7PM-7AM, please contact night-coverage. How to contact the [facility] Attending or Consulting provider 7A - 7P or covering provider during after hours 7P -7A, for this patient?    Check the care team in [facility] and look for a) attending/consulting [facility] provider listed and b) the [facility] team listed Log into [email or web address] and use [facility]'s universal password to access. If you do not have the password, please contact the hospital operator. Locate the [facility] provider you are looking for under [facility] and page to a number that you can be directly reached. If you still have difficulty reaching the provider, please page the [facility] (Director on Call) for the Hospitalists listed on amion for assistance.

## 2020-12-31 NOTE — Progress Notes (Signed)
Pt arrived to 5w11 from 84M. Pt is A&Ox1 confused. Pt has cortak in place and receiving tube feeding. Verified telemetry with CCMD. Skin assessment performed with CN. Skin assessment: scab to left thumb, right hip unstageable wound, bilateral leg abrasions foam dsg intact. Call bell within reach. Will continue to monitor pt. Tacey Ruiz, RN

## 2020-12-31 NOTE — Progress Notes (Signed)
SLP Cancellation Note  Patient Details Name: AREG BIALAS MRN: [id number] DOB: 1955-09-17   Cancelled treatment:       Reason Eval/Treat Not Completed: Medical issues which prohibited therapy. Upon SLP arrival for bedside swallow evaluation, pt presented with labored breathing and RR in  low-mid 30s. RN notified and agreed for SLP to f/u later for completion of eval.     Avie Echevaria, MA, CCC-SLP Acute Rehabilitation Services Office Number: [phone]  Paulette Blanch 12/31/2020, 9:57 AM

## 2020-12-31 NOTE — Progress Notes (Signed)
Placed placed on air mattress and set settings on bed to turn patient. Will continue to monitor pt. Tacey Ruiz RN

## 2021-01-01 ENCOUNTER — Inpatient Hospital Stay (HOSPITAL_COMMUNITY): Payer: Medicare Other

## 2021-01-01 LAB — BASIC METABOLIC PANEL
Anion gap: 7 (ref 5–15)
BUN: 22 mg/dL (ref 8–23)
CO2: 21 mmol/L — ABNORMAL LOW (ref 22–32)
Calcium: 8.1 mg/dL — ABNORMAL LOW (ref 8.9–10.3)
Chloride: 120 mmol/L — ABNORMAL HIGH (ref 98–111)
Creatinine, Ser: 0.82 mg/dL (ref 0.61–1.24)
GFR, Estimated: >60 mL/min (ref >60)
Glucose, Bld: 129 mg/dL — ABNORMAL HIGH (ref 70–99)
Potassium: 4.4 mmol/L (ref 3.5–5.1)
Sodium: 148 mmol/L — ABNORMAL HIGH (ref 135–145)

## 2021-01-01 LAB — GLUCOSE, CAPILLARY
Glucose-Capillary: 132 mg/dL — ABNORMAL HIGH (ref 70–99)
Glucose-Capillary: 123 mg/dL — ABNORMAL HIGH (ref 70–99)
Glucose-Capillary: 134 mg/dL — ABNORMAL HIGH (ref 70–99)
Glucose-Capillary: 118 mg/dL — ABNORMAL HIGH (ref 70–99)
Glucose-Capillary: 129 mg/dL — ABNORMAL HIGH (ref 70–99)

## 2021-01-01 LAB — PHOSPHORUS: Phosphorus: 3.6 mg/dL (ref 2.5–4.6)

## 2021-01-01 LAB — MAGNESIUM: Magnesium: 1.9 mg/dL (ref 1.7–2.4)

## 2021-01-01 MED ORDER — FREE WATER
150.0000 mL | Status: DC
  Administered 2021-01-01 – 2021-01-02 (×16): 150 mL

## 2021-01-01 NOTE — Progress Notes (Signed)
RT NOTES: Pt desaturating possibly d/t mouth breathing. Placed on venturi mask. Sats now 92%. Will continue to monitor. RN notified.

## 2021-01-01 NOTE — Progress Notes (Signed)
Pt has remained on yellow Mew throughout night for Resp rate 28-32. Pt occasionally coughs a loose cough but swallows before oral suction can be done. Yankuar suction at pillow side. Pt is mouth breather, mouth is dry oral care kit done. Pt favors laying on left side. Repositioned q2hrs right, back slight toward left  and repeat again, watch left hip had blanchable redness at last turn.

## 2021-01-01 NOTE — Progress Notes (Signed)
PROGRESS NOTE    [name]   YNW:[id number]  DOB: [birth year]  PCP: Rinaldo CloudHarwani, Mohan, MD    DOA: 12/23/2020 LOS: 7    Brief Narrative / Hospital Course to Date:   [name] is a 65 year old male with past medical history significant for multiple sclerosis, EtOH abuse, bipolar disorder, history of GI bleed 2/2 gastric ulcer/esophagitis, tobacco use disorder who presented to [facility] ED via EMS for confusion and being found down by his roommate for likely 3 days.  Roommate reports generalized weakness and confusion for several days.  Apparently the patient was laying in his bed for 3 days covered in feces and urine; however refusing any type of assistance.  Per EMS, patient living in disheveled/poor environment; APS report was filed.  Admitted for further evaluation and management.   Patient had an aspiration event and was started on Unasyn.  Due to increasing dyspnea and oxygen requirement, CTA chest was obtained and showed bilateral acute PE's.  Started on heparin and PCCM was consulted.    Assessment & Plan   Principal Problem:   Acute respiratory failure with hypoxia (HCC) Active Problems:   MS (multiple sclerosis) (HCC)   Alcohol abuse   Bipolar I disorder (HCC)   Elevated brain natriuretic peptide (BNP) level   Acute encephalopathy   Polysubstance abuse (HCC)   Decubitus ulcer of hip   Rhabdomyolysis   Protein-calorie malnutrition, severe   Acute pulmonary embolism (HCC)   Drug abuse, marijuana   Aspiration pneumonia (HCC)   Acute respiratory failure with hypoxia due to Acute bilateral pulmonary emboli -with imaging evidence of right heart strain.  Felt too high risk for tPA or catheter-directed intervention.   Aspiration pneumonia with left basilar consolidation on imaging. Shock multifactorial septic vs circulatory.  Required Levophed, weaned off and hemodynamically stable;  Ongoing soft BP's but maintaining MAP 9/11 - O2 needs up today, on Venti mask 14 L/min (from 4  L) --PCCM consulted --Transitioned off heparin drip >>Eliquis --On Unasyn and Doxy, Duonebs --Supplemental oxygen to maintain sats above 90%, wean as tolerated --Aspiration precautions --Chest xray repeat today  Hypernatremia - Na 148 this AM (9/10).  Free water flushes ordered.  Monitor BMP and adjust free water accordingly.   Acute metabolic encephalopathy -POA, improving Presented by EMS after being found down by roommate x3 days.  Associated generalized weakness.  Initially no evidence of infection, afebrile no leukocytosis.  UDS was positive for THC, EtOH less than 10.  CT head with cerebral atrophy but no acute findings.  UA negative.  Ammonia level normal.  Lactate normal.  Negative for COVID-19 and influenza A/B.  Unclear etiology but possibly due to alcohol abuse vs hypoxia given above. --may repeat MRI if not improving w/in next 24 hrs --Delirium precautions:     -Lights and TV off, minimize interruptions at night    -Blinds open and lights on during day    -Glasses/hearing aid with patient    -Frequent reorientation    -PT/OT when able    -Avoid sedation medications/Beers list medications  Rhabdomyolysis, mild, POA -patient was found down for 3 days.  Presented with a right hip wound and elevated [id number]. --Treated with IV fluids now off --Supportive care and follow renal function  Right hip wound, POA -wound noted to have eschar blistering.  Seen by wound care.  Previously discussed with general surgery, recommended Santyl and hydrotherapy. -- PT following for hydrotherapy --Continue Santyl daily  Severe malnutrition - POA related to chronic illness  MS, EtOH.  Pt has severe fat depletion and muscle atrophy. Dietitian consulted. Body mass index is 20.23 kg/m. Dietary supplements, tube feeds, vitamins per RD.  Abnormal blood culture staff epidermidis -grew in 2/4 bottles of blood cultures from admission.  Suspect this is contamination.  Initially was started on bank prior  to [facility] ID result, since discontinued. Patient remains afebrile with no leukocytosis --Follow repeat blood cultures of 9/4: No growth to date --Monitor clinically  Alcohol use disorder -uncertain type or quantity of alcohol.  On admission patient reported that he drinks "3 packs/day" --Monitor on CIWA protocol --Continue multivitamin folate and thiamine --TOC consulted for subs abuse resources  Tobacco use disorder -reports he smokes 3 packs/day.  Counseled extensively on need for smoking cessation. --Nicotine patch  History of multiple sclerosis, relapsing remitting type -follows with neurology Dr. Epimenio Foot.  Last seen 05/05/2020.  On Tecfidera since 2013; patient reports not taking medications just prior to admission.  MRI brain with and without contrast showed advanced white matter disease that is progressed from 2019 image with no acute or subacute findings --Hold off on resuming Tecfidera since he has been noncompliant outpatient --Outpatient follow-up with neuro  History bipolar disorder History of depression with suicidal ideations -stable Home regimen appears to be sertraline 50 mg daily, olanzapine 10 mg at bedtime.  Recently noncompliant with any medications. --Resumed on sertraline 25 mg daily and olanzapine 10 mg nightly  Adult failure to thrive -patient presented via EMS after being found down times several days by roommate.  Per EMS report, house noted in deplorable unlivable conditions, patient with right hip wound and just shoveled, covered in urine and feces --APS report was filed and currently seeking court appointed guardianship --Requires 24 hour care, SNF placement pending --TOC following  Generalized weakness /physical debility /deconditioning  PT and OT recommend SNF placement.   TOC following.   Patient BMI: Body mass index is 20.23 kg/m.   DVT prophylaxis: SCDs Start: 12/24/20 0312 apixaban (ELIQUIS) tablet 10 mg  apixaban (ELIQUIS) tablet 5 mg   Diet:  Diet  Orders (From admission, onward)     Start     Ordered   12/26/20 1349  DIET DYS 3 Room service appropriate? Yes with Assist; Fluid consistency: Thin  Diet effective now       Question Answer Comment  Room service appropriate? Yes with Assist   Fluid consistency: Thin      12/26/20 1348              Code Status: Full Code   Subjective 01/01/21    Patient sleeping very soundly when seen.  Increased o2 requirement.  CXR today appears stable from prior.  Suspect poor inspiratory effort due to muscular weakness (MS and malnutrition) with atelectasis in addition to Pe's.  No acute events reported.  Pt only briefly wakes to stimulus during exam, does not interact.  Disposition Plan & Communication   Status is: Inpatient  Remains inpatient appropriate because:Inpatient level of care appropriate due to severity of illness, remains encephalopathic and requiring artifical nutrition to meet needs.    Dispo: The patient is from: Home              Anticipated d/c is to: SNF              Patient currently is not medically stable to d/c.   Difficult to place patient No     Consults, Procedures, Significant Events   Consultants:  PCCM  Procedures:  None  Antimicrobials:  Anti-infectives (From admission, onward)    Start     Dose/Rate Route Frequency Ordered Stop   12/30/20 2200  doxycycline (VIBRA-TABS) tablet 100 mg  Status:  Discontinued        100 mg Oral Every 12 hours 12/30/20 1054 12/30/20 1629   12/30/20 2200  doxycycline (VIBRA-TABS) tablet 100 mg        100 mg Per Tube Every 12 hours 12/30/20 1629 01/02/21 0959   12/29/20 1000  doxycycline (VIBRAMYCIN) 100 mg in sodium chloride 0.9 % 250 mL IVPB        100 mg 125 mL/hr over 120 Minutes Intravenous Every 12 hours 12/28/20 1349 12/31/20 1215   12/28/20 1215  azithromycin (ZITHROMAX) 500 mg in sodium chloride 0.9 % 250 mL IVPB  Status:  Discontinued        500 mg 250 mL/hr over 60 Minutes Intravenous Every 24 hours  12/28/20 1125 12/28/20 1349   12/27/20 0900  Ampicillin-Sulbactam (UNASYN) 3 g in sodium chloride 0.9 % 100 mL IVPB        3 g 200 mL/hr over 30 Minutes Intravenous Every 6 hours 12/27/20 0803 01/02/21 2359   12/25/20 1600  vancomycin (VANCOREADY) IVPB 1250 mg/250 mL  Status:  Discontinued        1,250 mg 166.7 mL/hr over 90 Minutes Intravenous Every 24 hours 12/24/20 1544 12/25/20 0727   12/24/20 1630  vancomycin (VANCOREADY) IVPB 1500 mg/300 mL        1,500 mg 150 mL/hr over 120 Minutes Intravenous  Once 12/24/20 1544 12/24/20 1859         Micro    Objective   Vitals:   01/01/21 0800 01/01/21 0802 01/01/21 1000 01/01/21 1150  BP: 93/62  100/67 107/64  Pulse: 79  85 87  Resp: (!) 25  19 19   Temp:    98.2 F (36.8 C)  TempSrc:    Axillary  SpO2: 91% 92% 95% 98%  Weight:      Height:        Intake/Output Summary (Last 24 hours) at 01/01/2021 1528 Last data filed at 01/01/2021 0500 Gross per 24 hour  Intake 566.09 ml  Output 1200 ml  Net -633.91 ml   Filed Weights   12/23/20 2115 12/28/20 0205 01/01/21 0529  Weight: 72.6 kg 66.1 kg 65.8 kg    Physical Exam:  General exam: sleeping comfortably, no acute distress, chronically ill-appearing, underweight HEENT: CorTrak NGT in place, Venti mask in place Respiratory system: CTAB diminished bases, normal respiratory effort, on 14 L/min supplemental oxygen. Cardiovascular system: normal S1/S2, RRR, no pedal edema.   Central nervous system: unable to exam, pt somnolent and not following commands Extremities: soft mitten restraints on BLE's, no peripheral edema Psychiatry: unable to exam, pt somnolent and not following commands   Labs   Data Reviewed: I have personally reviewed following labs and imaging studies  CBC: Recent Labs  Lab 12/27/20 2050 12/28/20 0055 12/29/20 0224 12/30/20 0305 12/31/20 0041  WBC 9.4 9.6 10.3 6.3 5.0  HGB 10.9* 11.3* 9.9* 9.1* 9.3*  HCT 32.4* 35.0* 30.4* 28.4* 29.7*  MCV 91.5 93.3  93.0 95.3 95.5  PLT 230 222 241 239 285   Basic Metabolic Panel: Recent Labs  Lab 12/28/20 0055 12/29/20 0224 12/29/20 0942 12/31/20 0041 01/01/21 0123  NA 142 144  --  148* 148*  K 4.0 3.8  --  3.9 4.4  CL 111 113*  --  120* 120*  CO2 23 22  --  21* 21*  GLUCOSE 96 118*  --  156* 129*  BUN 17 18  --  25* 22  CREATININE 1.09 1.01  --  0.84 0.82  CALCIUM 8.2* 8.2*  --  8.3* 8.1*  MG 2.0  --  2.2 2.1 1.9  PHOS  --   --  3.4 3.2 3.6   GFR: Estimated Creatinine Clearance: 83.6 mL/min (by C-G formula based on SCr of 0.82 mg/dL). Liver Function Tests: Recent Labs  Lab 12/28/20 0055  AST 62*  ALT 33  ALKPHOS 80  BILITOT 1.5*  PROT 5.4*  ALBUMIN 2.2*   No results for input(s): LIPASE, AMYLASE in the last 168 hours. No results for input(s): AMMONIA in the last 168 hours.  Coagulation Profile: No results for input(s): INR, PROTIME in the last 168 hours.  Cardiac Enzymes: No results for input(s): CKTOTAL, CKMB, CKMBINDEX, TROPONINI in the last 168 hours.  BNP (last 3 results) No results for input(s): PROBNP in the last 8760 hours. HbA1C: No results for input(s): HGBA1C in the last 72 hours. CBG: Recent Labs  Lab 12/31/20 1148 12/31/20 1608 01/01/21 0453 01/01/21 0745 01/01/21 1152  GLUCAP 145* 130* 132* 123* 134*   Lipid Profile: No results for input(s): CHOL, HDL, LDLCALC, TRIG, CHOLHDL, LDLDIRECT in the last 72 hours. Thyroid Function Tests: No results for input(s): TSH, T4TOTAL, FREET4, T3FREE, THYROIDAB in the last 72 hours.  Anemia Panel: No results for input(s): VITAMINB12, FOLATE, FERRITIN, TIBC, IRON, RETICCTPCT in the last 72 hours.  Sepsis Labs: Recent Labs  Lab 12/28/20 0434  LATICACIDVEN 1.4    Recent Results (from the past 240 hour(s))  Urine Culture     Status: None   Collection Time: 12/23/20  9:38 PM   Specimen: In/Out Cath Urine  Result Value Ref Range Status   Specimen Description IN/OUT CATH URINE  Final   Special Requests NONE   Final   Culture   Final    NO GROWTH Performed at [facility], [address], [place], [place] [address]    Report Status 12/25/2020 FINAL  Final  Culture, blood (Routine x 2)     Status: Abnormal   Collection Time: 12/23/20 10:15 PM   Specimen: BLOOD  Result Value Ref Range Status   Specimen Description BLOOD LEFT ANTECUBITAL  Final   Special Requests   Final    BOTTLES DRAWN AEROBIC AND ANAEROBIC Blood Culture adequate volume   Culture  Setup Time   Final    GRAM POSITIVE COCCI IN BOTH AEROBIC AND ANAEROBIC BOTTLES CRITICAL RESULT CALLED TO, READ BACK BY AND VERIFIED WITH: L CURRAN PHARMD @1706  12/24/20 EB    Culture (A)  Final    STAPHYLOCOCCUS HOMINIS STAPHYLOCOCCUS SIMULANS STAPHYLOCOCCUS EPIDERMIDIS THE SIGNIFICANCE OF ISOLATING THIS ORGANISM FROM A SINGLE SET OF BLOOD CULTURES WHEN MULTIPLE SETS ARE DRAWN IS UNCERTAIN. PLEASE NOTIFY THE MICROBIOLOGY DEPARTMENT WITHIN ONE WEEK IF SPECIATION AND SENSITIVITIES ARE REQUIRED. Performed at [facility], [address] [place], [place]    Report Status 12/26/2020 FINAL  Final  Blood Culture ID Panel (Reflexed)     Status: Abnormal   Collection Time: 12/23/20 10:15 PM  Result Value Ref Range Status   Enterococcus faecalis NOT DETECTED NOT DETECTED Final   Enterococcus Faecium NOT DETECTED NOT DETECTED Final   Listeria monocytogenes NOT DETECTED NOT DETECTED Final   Staphylococcus species DETECTED (A) NOT DETECTED Final    Comment: CRITICAL RESULT CALLED TO, READ BACK BY AND VERIFIED WITH: L  CURRAN PHARMD @1706  12/24/20 EB    Staphylococcus aureus (BCID) NOT DETECTED NOT DETECTED Final   Staphylococcus epidermidis DETECTED (A) NOT DETECTED Final    Comment: CRITICAL RESULT CALLED TO, READ BACK BY AND VERIFIED WITH: L CURRAN PHARMD @1706  12/24/20 EB    Staphylococcus lugdunensis NOT DETECTED NOT DETECTED Final   Streptococcus species NOT DETECTED NOT DETECTED Final   Streptococcus agalactiae NOT DETECTED  NOT DETECTED Final   Streptococcus pneumoniae NOT DETECTED NOT DETECTED Final   Streptococcus pyogenes NOT DETECTED NOT DETECTED Final   A.calcoaceticus-baumannii NOT DETECTED NOT DETECTED Final   Bacteroides fragilis NOT DETECTED NOT DETECTED Final   Enterobacterales NOT DETECTED NOT DETECTED Final   Enterobacter cloacae complex NOT DETECTED NOT DETECTED Final   Escherichia coli NOT DETECTED NOT DETECTED Final   Klebsiella aerogenes NOT DETECTED NOT DETECTED Final   Klebsiella oxytoca NOT DETECTED NOT DETECTED Final   Klebsiella pneumoniae NOT DETECTED NOT DETECTED Final   Proteus species NOT DETECTED NOT DETECTED Final   Salmonella species NOT DETECTED NOT DETECTED Final   Serratia marcescens NOT DETECTED NOT DETECTED Final   Haemophilus influenzae NOT DETECTED NOT DETECTED Final   Neisseria meningitidis NOT DETECTED NOT DETECTED Final   Pseudomonas aeruginosa NOT DETECTED NOT DETECTED Final   Stenotrophomonas maltophilia NOT DETECTED NOT DETECTED Final   Candida albicans NOT DETECTED NOT DETECTED Final   Candida auris NOT DETECTED NOT DETECTED Final   Candida glabrata NOT DETECTED NOT DETECTED Final   Candida krusei NOT DETECTED NOT DETECTED Final   Candida parapsilosis NOT DETECTED NOT DETECTED Final   Candida tropicalis NOT DETECTED NOT DETECTED Final   Cryptococcus neoformans/gattii NOT DETECTED NOT DETECTED Final   Methicillin resistance mecA/C NOT DETECTED NOT DETECTED Final    Comment: Performed at [facility], [address] [place], [place] [address]  Culture, blood (Routine x 2)     Status: None   Collection Time: 12/23/20 10:20 PM   Specimen: BLOOD RIGHT HAND  Result Value Ref Range Status   Specimen Description BLOOD RIGHT HAND  Final   Special Requests   Final    BOTTLES DRAWN AEROBIC AND ANAEROBIC Blood Culture adequate volume   Culture   Final    NO GROWTH 5 DAYS Performed at [facility], [address], [place], [place] [address]    Report Status  12/29/2020 FINAL  Final  Resp Panel by RT-PCR (Flu A&B, Covid) Nasopharyngeal Swab     Status: None   Collection Time: 12/23/20 10:30 PM   Specimen: Nasopharyngeal Swab; Nasopharyngeal(NP) swabs in vial transport medium  Result Value Ref Range Status   SARS Coronavirus 2 by RT PCR NEGATIVE NEGATIVE Final    Comment: (NOTE) SARS-CoV-2 target nucleic acids are NOT DETECTED.  The SARS-CoV-2 RNA is generally detectable in upper respiratory specimens during the acute phase of infection. The lowest concentration of SARS-CoV-2 viral copies this assay can detect is 138 copies/mL. A negative result does not preclude SARS-Cov-2 infection and should not be used as the sole basis for treatment or other patient management decisions. A negative result may occur with  improper specimen collection/handling, submission of specimen other than nasopharyngeal swab, presence of viral mutation(s) within the areas targeted by this assay, and inadequate number of viral copies(<138 copies/mL). A negative result must be combined with clinical observations, patient history, and epidemiological information. The expected result is Negative.  Fact Sheet for Patients:  BloggerCourse.com  Fact Sheet for Healthcare Providers:  SeriousBroker.it  This  test is no t yet approved or cleared by the [place] and  has been authorized for detection and/or diagnosis of SARS-CoV-2 by FDA under an Emergency Use Authorization (EUA). This EUA will remain  in effect (meaning this test can be used) for the duration of the COVID-19 declaration under Section 564(b)(1) of the Act, 21 U.S.C.section 360bbb-3(b)(1), unless the authorization is terminated  or revoked sooner.       Influenza A by PCR NEGATIVE NEGATIVE Final   Influenza B by PCR NEGATIVE NEGATIVE Final    Comment: (NOTE) The Xpert Xpress SARS-CoV-2/FLU/RSV plus assay is intended as an aid in the diagnosis of  influenza from Nasopharyngeal swab specimens and should not be used as a sole basis for treatment. Nasal washings and aspirates are unacceptable for Xpert Xpress SARS-CoV-2/FLU/RSV testing.  Fact Sheet for Patients: BloggerCourse.com  Fact Sheet for Healthcare Providers: SeriousBroker.it  This test is not yet approved or cleared by the [place] FDA and has been authorized for detection and/or diagnosis of SARS-CoV-2 by FDA under an Emergency Use Authorization (EUA). This EUA will remain in effect (meaning this test can be used) for the duration of the COVID-19 declaration under Section 564(b)(1) of the Act, 21 U.S.C. section 360bbb-3(b)(1), unless the authorization is terminated or revoked.  Performed at [facility], [address] [place], [place] [address]   Culture, blood (routine x 2)     Status: None   Collection Time: 12/25/20  8:06 AM   Specimen: BLOOD  Result Value Ref Range Status   Specimen Description BLOOD BLOOD RIGHT HAND  Final   Special Requests   Final    BOTTLES DRAWN AEROBIC AND ANAEROBIC Blood Culture adequate volume   Culture   Final    NO GROWTH 5 DAYS Performed at [facility], [address], [place], [place] [address]    Report Status 12/30/2020 FINAL  Final  Culture, blood (routine x 2)     Status: None   Collection Time: 12/25/20  8:07 AM   Specimen: BLOOD  Result Value Ref Range Status   Specimen Description BLOOD BLOOD RIGHT HAND  Final   Special Requests   Final    BOTTLES DRAWN AEROBIC AND ANAEROBIC Blood Culture adequate volume   Culture   Final    NO GROWTH 5 DAYS Performed at [facility], [address], [place], [place] [address]    Report Status 12/30/2020 FINAL  Final  MRSA Next Gen by PCR, Nasal     Status: None   Collection Time: 12/28/20  2:05 AM   Specimen: Nasal Mucosa; Nasal Swab  Result Value Ref Range Status   MRSA by PCR Next Gen NOT DETECTED NOT  DETECTED Final    Comment: (NOTE) The GeneXpert MRSA Assay (FDA approved for NASAL specimens only), is one component of a comprehensive MRSA colonization surveillance program. It is not intended to diagnose MRSA infection nor to guide or monitor treatment for MRSA infections. Test performance is not FDA approved in patients less than 85 years old. Performed at [facility], [address] [place], [place] [address]       Imaging Studies   DG CHEST PORT 1 VIEW  Result Date: 01/01/2021 CLINICAL DATA:  Shortness of breath. Evaluate basilar opacities. Pneumothorax. EXAM: PORTABLE CHEST 1 VIEW COMPARISON:  12/27/2020 FINDINGS: Feeding tube extends beyond the inferior aspect of the film. Midline trachea. Mild cardiomegaly. Atherosclerosis in the transverse aorta. Small left pleural effusion  is similar. No pneumothorax. Asymmetric interstitial edema is greater left than right and not significantly changed. Left greater than right base airspace opacities are also felt to be similar. IMPRESSION: Since 12/27/2020, new feeding tube. Otherwise, little change in appearance of the chest. Cardiomegaly with mild pulmonary venous congestion and small left pleural effusion. Left greater than right airspace opacities, favoring atelectasis. Electronically Signed   By: Jeronimo Greaves M.D.   On: 01/01/2021 13:03     Medications   Scheduled Meds:  apixaban  10 mg Per Tube BID   Followed by   Melene Muller ON 01/06/2021] apixaban  5 mg Oral BID   vitamin C  500 mg Per Tube Daily   collagenase   Topical Daily   doxycycline  100 mg Per Tube Q12H   feeding supplement  237 mL Oral TID BM   feeding supplement (PROSource TF)  45 mL Per Tube BID   folic acid  1 mg Per Tube Daily   free water  150 mL Per Tube Q2H   Gerhardt's butt cream   Topical TID   ipratropium-albuterol  3 mL Nebulization BID   mouth rinse  15 mL Mouth Rinse BID   multivitamin with minerals  1 tablet Per Tube Daily   OLANZapine  10 mg Per Tube  QHS   polyethylene glycol  17 g Per Tube Daily   sertraline  25 mg Per Tube Daily   thiamine  100 mg Per Tube Daily   vitamin A  10,000 Units Per Tube Daily   Continuous Infusions:  sodium chloride 10 mL/hr at 12/31/20 1102   ampicillin-sulbactam (UNASYN) IV 3 g (01/01/21 1438)   feeding supplement (OSMOLITE 1.2 CAL) 70 mL/hr at 12/31/20 1337       LOS: 7 days    Time spent: 25 minutes    Pennie Banter, DO [facility]  01/01/2021, 3:28 PM      If 7PM-7AM, please contact night-coverage. How to contact the [facility] Attending or Consulting provider 7A - 7P or covering provider during after hours 7P -7A, for this patient?    Check the care team in [facility] and look for a) attending/consulting [facility] provider listed and b) the [facility] team listed Log into [email or web address] and use [facility] universal password to access. If you do not have the password, please contact the hospital operator. Locate the [facility] provider you are looking for under [facility] and page to a number that you can be directly reached. If you still have difficulty reaching the provider, please page the [facility] (Director on Call) for the Hospitalists listed on amion for assistance.

## 2021-01-02 DIAGNOSIS — Z515 Encounter for palliative care: Secondary | ICD-10-CM

## 2021-01-02 DIAGNOSIS — R1310 Dysphagia, unspecified: Secondary | ICD-10-CM

## 2021-01-02 DIAGNOSIS — Z7189 Other specified counseling: Secondary | ICD-10-CM

## 2021-01-02 LAB — BASIC METABOLIC PANEL
Anion gap: 8 (ref 5–15)
BUN: 20 mg/dL (ref 8–23)
CO2: 23 mmol/L (ref 22–32)
Calcium: 8.2 mg/dL — ABNORMAL LOW (ref 8.9–10.3)
Chloride: 112 mmol/L — ABNORMAL HIGH (ref 98–111)
Creatinine, Ser: 0.69 mg/dL (ref 0.61–1.24)
GFR, Estimated: >60 mL/min (ref >60)
Glucose, Bld: 107 mg/dL — ABNORMAL HIGH (ref 70–99)
Potassium: 4.7 mmol/L (ref 3.5–5.1)
Sodium: 143 mmol/L (ref 135–145)

## 2021-01-02 LAB — GLUCOSE, CAPILLARY
Glucose-Capillary: 105 mg/dL — ABNORMAL HIGH (ref 70–99)
Glucose-Capillary: 106 mg/dL — ABNORMAL HIGH (ref 70–99)
Glucose-Capillary: 106 mg/dL — ABNORMAL HIGH (ref 70–99)
Glucose-Capillary: 127 mg/dL — ABNORMAL HIGH (ref 70–99)
Glucose-Capillary: 136 mg/dL — ABNORMAL HIGH (ref 70–99)
Glucose-Capillary: 95 mg/dL (ref 70–99)

## 2021-01-02 MED ORDER — FREE WATER
150.0000 mL | Freq: Four times a day (QID) | Status: DC
  Administered 2021-01-03 – 2021-01-04 (×6): 150 mL

## 2021-01-02 NOTE — Progress Notes (Signed)
PROGRESS NOTE    BARTLETT ENKE   ZOX:[id number]  DOB: Mar 27, 1956  PCP: Rinaldo Cloud, MD    DOA: 12/23/2020 LOS: 8    Brief Narrative / Hospital Course to Date:   CHING RABIDEAU is a 65 year old male with past medical history significant for multiple sclerosis, EtOH abuse, bipolar disorder, history of GI bleed 2/2 gastric ulcer/esophagitis, tobacco use disorder who presented to [facility] ED via EMS for confusion and being found down by his roommate for likely 3 days.  Roommate reports generalized weakness and confusion for several days.  Apparently the patient was laying in his bed for 3 days covered in feces and urine; however refusing any type of assistance.  Per EMS, patient living in disheveled/poor environment; APS report was filed.  Admitted for further evaluation and management.   Patient had an aspiration event and was started on Unasyn.  Due to increasing dyspnea and oxygen requirement, CTA chest was obtained and showed bilateral acute PE's.  Started on heparin and PCCM was consulted.    Assessment & Plan   Principal Problem:   Acute respiratory failure with hypoxia (HCC) Active Problems:   MS (multiple sclerosis) (HCC)   Alcohol abuse   Bipolar I disorder (HCC)   Elevated brain natriuretic peptide (BNP) level   Acute encephalopathy   Polysubstance abuse (HCC)   Decubitus ulcer of hip   Rhabdomyolysis   Protein-calorie malnutrition, severe   Acute pulmonary embolism (HCC)   Drug abuse, marijuana   Aspiration pneumonia (HCC)   Acute respiratory failure with hypoxia due to Acute bilateral pulmonary emboli -with imaging evidence of right heart strain.  Felt too high risk for tPA or catheter-directed intervention.   Aspiration pneumonia with left basilar consolidation on imaging. Shock multifactorial septic vs circulatory.  Required Levophed, weaned off and hemodynamically stable;  Ongoing soft BP's but maintaining MAP 9/11 - O2 needs up today, on Venti mask 14 L/min (from 4  L) 9/12 - back down on 4 L/min --PCCM consulted --Transitioned off heparin drip --Continue Eliquis --On Unasyn and Doxy, Duonebs --Supplemental oxygen to maintain sats above 90%, wean as tolerated --Aspiration precautions --Chest xray repeat 9/11 stable  Dyspagia Severe malnutrition - POA related to chronic illness MS, EtOH.  Pt has severe fat depletion and muscle atrophy. Dietitian consulted. Body mass index is 57.1 kg/m. Dietary supplements, tube feeds, vitamins per RD. On tube feeds by CorTrak currently Dysphagia 3 diet --in prep for d/c - Nocturnal Tube feeds and encourage PO diet during the day  Hypernatremia - Resolved with free water flushes. Monitor BMP and adjust free water accordingly.  Reduce FW flushes to 150 cc q6h (from q2h)  Acute metabolic encephalopathy -POA, improving Presented by EMS after being found down by roommate x3 days.  Associated generalized weakness.  Initially no evidence of infection, afebrile no leukocytosis.  UDS was positive for THC, EtOH less than 10.  CT head with cerebral atrophy but no acute findings.  UA negative.  Ammonia level normal.  Lactate normal.  Negative for COVID-19 and influenza A/B.  Unclear etiology but possibly due to alcohol abuse vs hypoxia given above. --may repeat MRI if not improving w/in next 24 hrs --Delirium precautions:     -Lights and TV off, minimize interruptions at night    -Blinds open and lights on during day    -Glasses/hearing aid with patient    -Frequent reorientation    -PT/OT when able    -Avoid sedation medications/Beers list medications  Rhabdomyolysis, mild, POA -  patient was found down for 3 days.  Presented with a right hip wound and elevated [id number]. --Treated with IV fluids now off --Supportive care and follow renal function  Right hip wound, POA -wound noted to have eschar blistering.  Seen by wound care.  Previously discussed with general surgery, recommended Santyl and hydrotherapy. -- PT  following for hydrotherapy --Continue Santyl daily  Abnormal blood culture staff epidermidis -grew in 2/4 bottles of blood cultures from admission.  Suspect this is contamination.  [facility], since [facility]. Patient remains afebrile with no leukocytosis --Follow repeat blood cultures of 9/4: No growth to date --Monitor clinically  Alcohol use disorder -uncertain type or quantity of alcohol.  On admission patient reported that he drinks "3 packs/day" --Monitor on CIWA protocol --Continue multivitamin folate and thiamine --TOC consulted for subs abuse resources  Tobacco use disorder -reports he smokes 3 packs/day.  Counseled extensively on need for smoking cessation. --Nicotine patch  History of multiple sclerosis, relapsing remitting type -follows with neurology Dr. Epimenio FootSater.  Last seen 05/05/2020.  On Tecfidera since 2013; patient reports not taking medications just prior to admission.  MRI brain with and without contrast showed advanced white matter disease that is progressed from 2019 image with no acute or subacute findings --Hold off on resuming Tecfidera since he has been noncompliant outpatient --Outpatient follow-up with neuro  History bipolar disorder History of depression with suicidal ideations -stable Home regimen appears to be sertraline 50 mg daily, olanzapine 10 mg at bedtime.  Recently noncompliant with any medications. --Resumed on sertraline 25 mg daily and olanzapine 10 mg nightly  Adult failure to thrive -patient presented via EMS after being found down times several days by roommate.  Per EMS report, house noted in deplorable unlivable conditions, patient with right hip wound and just shoveled, covered in urine and feces --APS report was filed and currently seeking court appointed guardianship --Requires 24 hour care, SNF placement pending --TOC following  Generalized weakness /physical debility /deconditioning  PT and OT  recommend SNF placement.   TOC following.   Patient BMI: Body mass index is 57.1 kg/m.   DVT prophylaxis: SCDs Start: 12/24/20 0312 apixaban (ELIQUIS) tablet 10 mg  apixaban (ELIQUIS) tablet 5 mg   Diet:  Diet Orders (From admission, onward)     Start     Ordered   12/26/20 1349  DIET DYS 3 Room service appropriate? Yes with Assist; Fluid consistency: Thin  Diet effective now       Question Answer Comment  Room service appropriate? Yes with Assist   Fluid consistency: Thin      12/26/20 1348              Code Status: Full Code   Subjective 01/02/21    Patient awake today on rounds, RN at bedside.  He denies shortness of breath right now, no N/V, CP, F/C or other complaints.  No acute events reported.  Disposition Plan & Communication   Status is: Inpatient  Remains inpatient appropriate because:Inpatient level of care appropriate due to severity of illness, remains encephalopathic and requiring artifical nutrition to meet needs.    Dispo: The patient is from: Home              Anticipated d/c is to: SNF              Patient currently is not medically stable to d/c.   Difficult to place patient No  Consults, Procedures, Significant Events   Consultants:  PCCM  Procedures:  None  Antimicrobials:  Anti-infectives (From admission, onward)    Start     Dose/Rate Route Frequency Ordered Stop   12/30/20 2200  doxycycline (VIBRA-TABS) tablet 100 mg  Status:  Discontinued        100 mg Oral Every 12 hours 12/30/20 1054 12/30/20 1629   12/30/20 2200  doxycycline (VIBRA-TABS) tablet 100 mg        100 mg Per Tube Every 12 hours 12/30/20 1629 01/01/21 2300   12/29/20 1000  doxycycline (VIBRAMYCIN) 100 mg in sodium chloride 0.9 % 250 mL IVPB        100 mg 125 mL/hr over 120 Minutes Intravenous Every 12 hours 12/28/20 1349 12/31/20 1215   12/28/20 1215  azithromycin (ZITHROMAX) 500 mg in sodium chloride 0.9 % 250 mL IVPB  Status:  Discontinued        500 mg 250  mL/hr over 60 Minutes Intravenous Every 24 hours 12/28/20 1125 12/28/20 1349   12/27/20 0900  Ampicillin-Sulbactam (UNASYN) 3 g in sodium chloride 0.9 % 100 mL IVPB        3 g 200 mL/hr over 30 Minutes Intravenous Every 6 hours 12/27/20 0803 01/02/21 2359   12/25/20 1600  vancomycin (VANCOREADY) IVPB 1250 mg/250 mL  Status:  Discontinued        1,250 mg 166.7 mL/hr over 90 Minutes Intravenous Every 24 hours 12/24/20 1544 12/25/20 0727   12/24/20 1630  vancomycin (VANCOREADY) IVPB 1500 mg/300 mL        1,500 mg 150 mL/hr over 120 Minutes Intravenous  Once 12/24/20 1544 12/24/20 1859         Micro    Objective   Vitals:   01/02/21 1200 01/02/21 1202 01/02/21 1400 01/02/21 1631  BP: 100/65 105/61 93/64 93/61   Pulse: 77 66 76 73  Resp: (!) 27 20 (!) 26 20  Temp:  97.9 F (36.6 C)  98 F (36.7 C)  TempSrc:  Axillary  Axillary  SpO2: 94% 94% 93% 93%  Weight:      Height:        Intake/Output Summary (Last 24 hours) at 01/02/2021 1820 Last data filed at 01/02/2021 1818 Gross per 24 hour  Intake 3224.96 ml  Output 2600 ml  Net 624.96 ml   Filed Weights   12/28/20 0205 01/01/21 0529 01/02/21 0625  Weight: 66.1 kg 65.8 kg (!) 185.7 kg    Physical Exam:  General exam: awake, alert, no acute distress, chronically ill-appearing, underweight HEENT: CorTrak NGT in place, nasal cannula in place Respiratory system: lungs clear, shallow inspirations, normal respiratory effort, on 4 L/min supplemental oxygen. Cardiovascular system: normal S1/S2, RRR, no pedal edema.   Central nervous system: grossly non-focal exam, awake, oriented to self and hospital. Extremities: soft mitten restraints on BLE's, no peripheral edema   Labs   Data Reviewed: I have personally reviewed following labs and imaging studies  CBC: Recent Labs  Lab 12/27/20 2050 12/28/20 0055 12/29/20 0224 12/30/20 0305 12/31/20 0041  WBC 9.4 9.6 10.3 6.3 5.0  HGB 10.9* 11.3* 9.9* 9.1* 9.3*  HCT 32.4* 35.0*  30.4* 28.4* 29.7*  MCV 91.5 93.3 93.0 95.3 95.5  PLT 230 222 241 239 285   Basic Metabolic Panel: Recent Labs  Lab 12/28/20 0055 12/29/20 0224 12/29/20 0942 12/31/20 0041 01/01/21 0123 01/02/21 0041  NA 142 144  --  148* 148* 143  K 4.0 3.8  --  3.9 4.4 4.7  CL  111 113*  --  120* 120* 112*  CO2 23 22  --  21* 21* 23  GLUCOSE 96 118*  --  156* 129* 107*  BUN 17 18  --  25* 22 20  CREATININE 1.09 1.01  --  0.84 0.82 0.69  CALCIUM 8.2* 8.2*  --  8.3* 8.1* 8.2*  MG 2.0  --  2.2 2.1 1.9  --   PHOS  --   --  3.4 3.2 3.6  --    GFR: Estimated Creatinine Clearance: 155.6 mL/min (by C-G formula based on SCr of 0.69 mg/dL). Liver Function Tests: Recent Labs  Lab 12/28/20 0055  AST 62*  ALT 33  ALKPHOS 80  BILITOT 1.5*  PROT 5.4*  ALBUMIN 2.2*   No results for input(s): LIPASE, AMYLASE in the last 168 hours. No results for input(s): AMMONIA in the last 168 hours.  Coagulation Profile: No results for input(s): INR, PROTIME in the last 168 hours.  Cardiac Enzymes: No results for input(s): CKTOTAL, CKMB, CKMBINDEX, TROPONINI in the last 168 hours.  BNP (last 3 results) No results for input(s): PROBNP in the last 8760 hours. HbA1C: No results for input(s): HGBA1C in the last 72 hours. CBG: Recent Labs  Lab 01/02/21 0012 01/02/21 0345 01/02/21 0811 01/02/21 1204 01/02/21 1633  GLUCAP 105* 127* 106* 95 106*   Lipid Profile: No results for input(s): CHOL, HDL, LDLCALC, TRIG, CHOLHDL, LDLDIRECT in the last 72 hours. Thyroid Function Tests: No results for input(s): TSH, T4TOTAL, FREET4, T3FREE, THYROIDAB in the last 72 hours.  Anemia Panel: No results for input(s): VITAMINB12, FOLATE, FERRITIN, TIBC, IRON, RETICCTPCT in the last 72 hours.  Sepsis Labs: Recent Labs  Lab 12/28/20 0434  LATICACIDVEN 1.4    Recent Results (from the past 240 hour(s))  Urine Culture     Status: None   Collection Time: 12/23/20  9:38 PM   Specimen: In/Out Cath Urine  Result Value  Ref Range Status   Specimen Description IN/OUT CATH URINE  Final   Special Requests NONE  Final   Culture   Final    NO GROWTH Performed at [facility] Lab, [address] [place], [place] [address]    Report Status 12/25/2020 FINAL  Final  Culture, blood (Routine x 2)     Status: Abnormal   Collection Time: 12/23/20 10:15 PM   Specimen: BLOOD  Result Value Ref Range Status   Specimen Description BLOOD LEFT ANTECUBITAL  Final   Special Requests   Final    BOTTLES DRAWN AEROBIC AND ANAEROBIC Blood Culture adequate volume   Culture  Setup Time   Final    GRAM POSITIVE COCCI IN BOTH AEROBIC AND ANAEROBIC BOTTLES CRITICAL RESULT CALLED TO, READ BACK BY AND VERIFIED WITH: L CURRAN PHARMD @1706  12/24/20 EB    Culture (A)  Final    STAPHYLOCOCCUS HOMINIS STAPHYLOCOCCUS SIMULANS STAPHYLOCOCCUS EPIDERMIDIS THE SIGNIFICANCE OF ISOLATING THIS ORGANISM FROM A SINGLE SET OF BLOOD CULTURES WHEN MULTIPLE SETS ARE DRAWN IS UNCERTAIN. PLEASE NOTIFY THE MICROBIOLOGY DEPARTMENT WITHIN ONE WEEK IF SPECIATION AND SENSITIVITIES ARE REQUIRED. Performed at [facility] Lab, [address], [place], [place]    Report Status 12/26/2020 FINAL  Final  Blood Culture ID Panel (Reflexed)     Status: Abnormal   Collection Time: 12/23/20 10:15 PM  Result Value Ref Range Status   Enterococcus faecalis NOT DETECTED NOT DETECTED Final   Enterococcus Faecium NOT DETECTED NOT DETECTED Final   Listeria monocytogenes NOT DETECTED NOT  DETECTED Final   Staphylococcus species DETECTED (A) NOT DETECTED Final    Comment: CRITICAL RESULT CALLED TO, READ BACK BY AND VERIFIED WITH: L CURRAN PHARMD @1706  12/24/20 EB    Staphylococcus aureus (BCID) NOT DETECTED NOT DETECTED Final   Staphylococcus epidermidis DETECTED (A) NOT DETECTED Final    Comment: CRITICAL RESULT CALLED TO, READ BACK BY AND VERIFIED WITH: L CURRAN PHARMD @1706  12/24/20 EB    Staphylococcus lugdunensis NOT DETECTED NOT DETECTED Final    Streptococcus species NOT DETECTED NOT DETECTED Final   Streptococcus agalactiae NOT DETECTED NOT DETECTED Final   Streptococcus pneumoniae NOT DETECTED NOT DETECTED Final   Streptococcus pyogenes NOT DETECTED NOT DETECTED Final   A.calcoaceticus-baumannii NOT DETECTED NOT DETECTED Final   Bacteroides fragilis NOT DETECTED NOT DETECTED Final   Enterobacterales NOT DETECTED NOT DETECTED Final   Enterobacter cloacae complex NOT DETECTED NOT DETECTED Final   Escherichia coli NOT DETECTED NOT DETECTED Final   Klebsiella aerogenes NOT DETECTED NOT DETECTED Final   Klebsiella oxytoca NOT DETECTED NOT DETECTED Final   Klebsiella pneumoniae NOT DETECTED NOT DETECTED Final   Proteus species NOT DETECTED NOT DETECTED Final   Salmonella species NOT DETECTED NOT DETECTED Final   Serratia marcescens NOT DETECTED NOT DETECTED Final   Haemophilus influenzae NOT DETECTED NOT DETECTED Final   Neisseria meningitidis NOT DETECTED NOT DETECTED Final   Pseudomonas aeruginosa NOT DETECTED NOT DETECTED Final   Stenotrophomonas maltophilia NOT DETECTED NOT DETECTED Final   Candida albicans NOT DETECTED NOT DETECTED Final   Candida auris NOT DETECTED NOT DETECTED Final   Candida glabrata NOT DETECTED NOT DETECTED Final   Candida krusei NOT DETECTED NOT DETECTED Final   Candida parapsilosis NOT DETECTED NOT DETECTED Final   Candida tropicalis NOT DETECTED NOT DETECTED Final   Cryptococcus neoformans/gattii NOT DETECTED NOT DETECTED Final   Methicillin resistance mecA/C NOT DETECTED NOT DETECTED Final    Comment: Performed at [facility] Lab, [address] [place], [address] [place]  Culture, blood (Routine x 2)     Status: None   Collection Time: 12/23/20 10:20 PM   Specimen: BLOOD RIGHT HAND  Result Value Ref Range Status   Specimen Description BLOOD RIGHT HAND  Final   Special Requests   Final    BOTTLES DRAWN AEROBIC AND ANAEROBIC Blood Culture adequate volume   Culture   Final    NO GROWTH 5  DAYS Performed at [facility], [address], [place], [address] [place]    Report Status 12/29/2020 FINAL  Final  Resp Panel by RT-PCR (Flu A&B, Covid) Nasopharyngeal Swab     Status: None   Collection Time: 12/23/20 10:30 PM   Specimen: Nasopharyngeal Swab; Nasopharyngeal(NP) swabs in vial transport medium  Result Value Ref Range Status   SARS Coronavirus 2 by RT PCR NEGATIVE NEGATIVE Final    Comment: (NOTE) SARS-CoV-2 target nucleic acids are NOT DETECTED.  The SARS-CoV-2 RNA is generally detectable in upper respiratory specimens during the acute phase of infection. The lowest concentration of SARS-CoV-2 viral copies this assay can detect is 138 copies/mL. A negative result does not preclude SARS-Cov-2 infection and should not be used as the sole basis for treatment or other patient management decisions. A negative result may occur with  improper specimen collection/handling, submission of specimen other than nasopharyngeal swab, presence of viral mutation(s) within the areas targeted by this assay, and inadequate number of viral copies(<138 copies/mL). A negative result must be combined with clinical observations, patient  history, and epidemiological information. The expected result is Negative.  Fact Sheet for Patients:  BloggerCourse.com  Fact Sheet for Healthcare Providers:  SeriousBroker.it  This test is no t yet approved or cleared by the [place] FDA and  has been authorized for detection and/or diagnosis of SARS-CoV-2 by FDA under an Emergency Use Authorization (EUA). This EUA will remain  in effect (meaning this test can be used) for the duration of the COVID-19 declaration under Section 564(b)(1) of the Act, 21 U.S.C.section 360bbb-3(b)(1), unless the authorization is terminated  or revoked sooner.       Influenza A by PCR NEGATIVE NEGATIVE Final   Influenza B by PCR NEGATIVE NEGATIVE Final     Comment: (NOTE) The Xpert Xpress SARS-CoV-2/FLU/RSV plus assay is intended as an aid in the diagnosis of influenza from Nasopharyngeal swab specimens and should not be used as a sole basis for treatment. Nasal washings and aspirates are unacceptable for Xpert Xpress SARS-CoV-2/FLU/RSV testing.  Fact Sheet for Patients: BloggerCourse.com  Fact Sheet for Healthcare Providers: SeriousBroker.it  This test is not yet approved or cleared by the [place] FDA and has been authorized for detection and/or diagnosis of SARS-CoV-2 by FDA under an Emergency Use Authorization (EUA). This EUA will remain in effect (meaning this test can be used) for the duration of the COVID-19 declaration under Section 564(b)(1) of the Act, 21 U.S.C. section 360bbb-3(b)(1), unless the authorization is terminated or revoked.  Performed at [facility] Lab, [address] [place], [place] [address]   Culture, blood (routine x 2)     Status: None   Collection Time: 12/25/20  8:06 AM   Specimen: BLOOD  Result Value Ref Range Status   Specimen Description BLOOD BLOOD RIGHT HAND  Final   Special Requests   Final    BOTTLES DRAWN AEROBIC AND ANAEROBIC Blood Culture adequate volume   Culture   Final    NO GROWTH 5 DAYS Performed at [facility], [address] [place], [place] [address]    Report Status 12/30/2020 FINAL  Final  Culture, blood (routine x 2)     Status: None   Collection Time: 12/25/20  8:07 AM   Specimen: BLOOD  Result Value Ref Range Status   Specimen Description BLOOD BLOOD RIGHT HAND  Final   Special Requests   Final    BOTTLES DRAWN AEROBIC AND ANAEROBIC Blood Culture adequate volume   Culture   Final    NO GROWTH 5 DAYS Performed at [facility], [address], [place], [place] [address]    Report Status 12/30/2020 FINAL  Final  MRSA Next Gen by PCR, Nasal     Status: None   Collection Time: 12/28/20  2:05 AM    Specimen: Nasal Mucosa; Nasal Swab  Result Value Ref Range Status   MRSA by PCR Next Gen NOT DETECTED NOT DETECTED Final    Comment: (NOTE) The GeneXpert MRSA Assay (FDA approved for NASAL specimens only), is one component of a comprehensive MRSA colonization surveillance program. It is not intended to diagnose MRSA infection nor to guide or monitor treatment for MRSA infections. Test performance is not FDA approved in patients less than 25 years old. Performed at [facility], [address], [place], [place] [address]       Imaging Studies   DG CHEST PORT 1 VIEW  Result Date: 01/01/2021 CLINICAL DATA:  Shortness of breath. Evaluate basilar opacities. Pneumothorax. EXAM: PORTABLE CHEST 1 VIEW COMPARISON:  12/27/2020 FINDINGS: Feeding tube extends beyond the inferior aspect of the film. Midline trachea. Mild cardiomegaly. Atherosclerosis in the transverse aorta. Small left pleural effusion is similar. No pneumothorax. Asymmetric interstitial edema is greater left than right and not significantly changed. Left greater than right base airspace opacities are also felt to be similar. IMPRESSION: Since 12/27/2020, new feeding tube. Otherwise, little change in appearance of the chest. Cardiomegaly with mild pulmonary venous congestion and small left pleural effusion. Left greater than right airspace opacities, favoring atelectasis. Electronically Signed   By: Jeronimo Greaves M.D.   On: 01/01/2021 13:03     Medications   Scheduled Meds:  apixaban  10 mg Per Tube BID   Followed by   Melene Muller ON 01/06/2021] apixaban  5 mg Oral BID   vitamin C  500 mg Per Tube Daily   collagenase   Topical Daily   feeding supplement  237 mL Oral TID BM   feeding supplement (PROSource TF)  45 mL Per Tube BID   folic acid  1 mg Per Tube Daily   free water  150 mL Per Tube Q2H   Gerhardt's butt cream   Topical TID   ipratropium-albuterol  3 mL Nebulization BID   mouth rinse  15 mL Mouth Rinse BID    multivitamin with minerals  1 tablet Per Tube Daily   OLANZapine  10 mg Per Tube QHS   polyethylene glycol  17 g Per Tube Daily   sertraline  25 mg Per Tube Daily   thiamine  100 mg Per Tube Daily   vitamin A  10,000 Units Per Tube Daily   Continuous Infusions:  sodium chloride 10 mL/hr at 01/02/21 1400   ampicillin-sulbactam (UNASYN) IV 3 g (01/02/21 1555)   feeding supplement (OSMOLITE 1.2 CAL) 70 mL/hr at 12/31/20 1337       LOS: 8 days    Time spent: 25 minutes    Pennie Banter, DO [facility]  01/02/2021, 6:20 PM      If 7PM-7AM, please contact night-coverage. How to contact the [facility] Attending or Consulting provider 7A - 7P or covering provider during after hours 7P -7A, for this patient?    Check the care team in [facility] and look for a) attending/consulting TRH provider listed and b) the [facility] team listed Log into [email or web address] and use [place]'s universal password to access. If you do not have the password, please contact the hospital operator. Locate the [facility] provider you are looking for under [facility] and page to a number that you can be directly reached. If you still have difficulty reaching the provider, please page the [facility] (Director on Call) for the Hospitalists listed on amion for assistance.

## 2021-01-02 NOTE — Progress Notes (Addendum)
ANTICOAGULATION CONSULT NOTE - Follow Up Consult  Pharmacy Consult for Eliquis Indication: pulmonary embolus  No Known Allergies  Patient Measurements: Height: 5\' 11"  (180.3 cm) Weight: (!) 185.7 kg (409 lb 6.3 oz) IBW/kg (Calculated) : 75.3  Vital Signs: Temp: 98.4 F (36.9 C) (09/12 0808) Temp Source: Oral (09/12 0808) BP: 100/59 (09/12 0808) Pulse Rate: 71 (09/12 0808)  Labs: Recent Labs    12/31/20 0041 01/01/21 0123 01/02/21 0041  HGB 9.3*  --   --   HCT 29.7*  --   --   PLT 285  --   --   CREATININE 0.84 0.82 0.69     Estimated Creatinine Clearance: 155.6 mL/min (by C-G formula based on SCr of 0.69 mg/dL).   Medications:  Scheduled:   apixaban  10 mg Per Tube BID   Followed by   03/04/21 ON 01/06/2021] apixaban  5 mg Oral BID   vitamin C  500 mg Per Tube Daily   collagenase   Topical Daily   feeding supplement  237 mL Oral TID BM   feeding supplement (PROSource TF)  45 mL Per Tube BID   folic acid  1 mg Per Tube Daily   free water  150 mL Per Tube Q2H   Gerhardt's butt cream   Topical TID   ipratropium-albuterol  3 mL Nebulization BID   mouth rinse  15 mL Mouth Rinse BID   multivitamin with minerals  1 tablet Per Tube Daily   OLANZapine  10 mg Per Tube QHS   polyethylene glycol  17 g Per Tube Daily   sertraline  25 mg Per Tube Daily   thiamine  100 mg Per Tube Daily   vitamin A  10,000 Units Per Tube Daily   Infusions:   sodium chloride 250 mL (01/02/21 0024)   ampicillin-sulbactam (UNASYN) IV 3 g (01/02/21 0400)   feeding supplement (OSMOLITE 1.2 CAL) 70 mL/hr at 12/31/20 1337   PRN: acetaminophen, acetaminophen, fentaNYL (SUBLIMAZE) injection, ondansetron **OR** ondansetron (ZOFRAN) IV  Assessment: 65 yo male admitted on 9/3 with AMS and aspiration pneumonia. On 9/6, patient developed progressively increasing oxygen requirement, CTA chest revealed acute submassive PE with evidence of RHS. Patient was started on heparin on 9/6 and transitioned to  apixaban on 9/9 (10 mg PO BID x 7 days, followed by 5 mg PO BID).  Per IR, overall clot burden deemed small in volume with respiratory status likely due to near complete consolidation and collapse of the left lower lobe. Not planning intervention.   Hemoglobin on 9/10 is slightly down at 9.3. No signs/symptoms of bleeding noted in chart review.   Goal of Therapy:  Monitor platelets by anticoagulation protocol: Yes   Plan:  Continue apixaban 10 mg PO BID x 7 days, followed by 5 mg PO BID. Monitor CBC peripherally and for signs/symptoms of bleeding.  Pharmacy will sign off at this time - please re-consult as needed.  11/10, Student Pharmacist   Please check AMION for all [facility] phone numbers After 10:00 PM, call Main Pharmacy [phone]

## 2021-01-02 NOTE — Progress Notes (Addendum)
This wound has healed sufficiently that wound care is transferring all further management of this wound to the nursing staff and will sign off at this time.      Physical Therapy Wound Treatment Patient Details  Name: [name] MRN: [id number] Date of Birth: January 11, 1956  Today's Date: 01/02/2021 Time: 4628-6381 Time Calculation (min): 28 min  Subjective  Subjective Assessment Subjective: pt able to express his need for pain meds/intolerance for the pain during PLS and debridement Patient and Family Stated Goals: None stated Date of Onset:  (Unknown - PTA) Prior Treatments: Dressing changes  Pain Score:  premedicated with fentanyl, still 4/10 pain  Wound Assessment  Pressure Injury 12/24/20 Hip Right Unstageable - Full thickness tissue loss in which the base of the injury is covered by slough (yellow, tan, gray, green or brown) and/or eschar (tan, brown or black) in the wound bed. Black (eschar) surrounded by yellow (Active)  Wound Image   01/02/21 1136  Dressing Type ABD;Barrier Film (skin prep);Gauze (Comment);Impregnated gauze (bismuth);Moist to moist;Normal saline moist dressing;Santyl 01/02/21 1136  Dressing Changed;Clean;Dry;Intact 01/02/21 1136  Dressing Change Frequency Daily 01/02/21 1136  State of Healing Early/partial granulation 01/02/21 1136  Site / Wound Assessment Clean;Dry;Pink;Red 01/02/21 1136  % Wound base Red or Granulating 95% 01/02/21 1136  % Wound base Yellow/Fibrinous Exudate 5% 01/02/21 1136  % Wound base Black/Eschar 0% 01/01/21 0529  % Wound base Other/Granulation Tissue (Comment) 0% 01/02/21 1136  Peri-wound Assessment Intact;Erythema (blanchable) 01/02/21 1136  Wound Length (cm) 7 cm 01/02/21 1136  Wound Width (cm) 6 cm 01/02/21 1136  Wound Depth (cm) 0.2 cm 01/02/21 1136  Wound Surface Area (cm^2) 42 cm^2 01/02/21 1136  Wound Volume (cm^3) 8.4 cm^3 01/02/21 1136  Tunneling (cm) 0 12/26/20 1121  Undermining (cm) 0 12/26/20 1121  Margins  Unattached edges (unapproximated) 12/30/20 1115  Drainage Amount Scant 01/02/21 1136  Drainage Description Serous 01/02/21 1136  Treatment Cleansed;Debridement (Selective);Hydrotherapy (Pulse lavage);Packing (Saline gauze) 01/02/21 1136      Hydrotherapy Pulsed lavage therapy - wound location: R hip Pulsed Lavage with Suction (psi): 4 psi (to 8) Pulsed Lavage with Suction - Normal Saline Used: 1000 mL Pulsed Lavage Tip: Tip with splash shield Selective Debridement Selective Debridement - Location: R hip Selective Debridement - Tools Used: Forceps, Scalpel, Scissors Selective Debridement - Tissue Removed: yellow, brown nonviable tissue    Wound Assessment and Plan  Wound Therapy - Assess/Plan/Recommendations Wound Therapy - Clinical Statement: Pt's wound has progressed to healthy early granulation period with only yellow tissue being healthy connective tissue.  Should start to epithelialize soon.  Will transfer all management for the wound to nursing staff and sign off at this time. Wound Therapy - Functional Problem List: Decreased tolerance for positioning OOB due to MS and wound. Factors Delaying/Impairing Wound Healing: Immobility, Multiple medical problems Hydrotherapy Plan: Debridement, Dressing change, Patient/family education, Pulsatile lavage with suction Wound Therapy - Frequency: 3X / week Wound Therapy - Follow Up Recommendations: dressing changes by RN  Wound Therapy Goals- Improve the function of patient's integumentary system by progressing the wound(s) through the phases of wound healing (inflammation - proliferation - remodeling) by: Wound Therapy Goals - Improve the function of patient's integumentary system by progressing the wound(s) through the phases of wound healing by: Decrease Necrotic Tissue to: 20% (met at 5%) Decrease Necrotic Tissue - Progress: Met Increase Granulation Tissue to: 80% (met at 95%) Increase Granulation Tissue - Progress: Met Goals/treatment  plan/discharge plan were made with and agreed upon by patient/family:  No, Patient unable to participate in goals/treatment/discharge plan and family unavailable Time For Goal Achievement: 7 days Wound Therapy - Potential for Goals: Good  Goals will be updated until maximal potential achieved or discharge criteria met.  Discharge criteria: when goals achieved, discharge from hospital, MD decision/surgical intervention, no progress towards goals, refusal/missing three consecutive treatments without notification or medical reason.  GP     Charges PT Wound Care Charges $Wound Debridement up to 20 cm: < or equal to 20 cm $PT Hydrotherapy Dressing: 1 dressing $PT PLS Gun and Tip: 1 Supply $PT Hydrotherapy Visit: 1 Visit     01/02/2021  Ginger Carne., PT Acute Rehabilitation Services [phone]  (pager) [phone]  (office)  Tessie Fass Orie Cuttino 01/02/2021, 11:41 AM

## 2021-01-02 NOTE — Evaluation (Signed)
Clinical/Bedside Swallow Evaluation Patient Details  Name: [name] MRN: [id number] Date of Birth: 1956/01/05  Today's Date: 01/02/2021 Time: SLP Start Time (ACUTE ONLY): 1022 SLP Stop Time (ACUTE ONLY): 1032 SLP Time Calculation (min) (ACUTE ONLY): 10 min  Past Medical History:  Past Medical History:  Diagnosis Date   Depression    Mental disorder    Movement disorder    MS (multiple sclerosis) (HCC)    Multiple sclerosis, relapsing-remitting (HCC)    Neuromuscular disorder (HCC)    Vision abnormalities    Past Surgical History:  Past Surgical History:  Procedure Laterality Date   BIOPSY  10/17/2017   Procedure: BIOPSY;  Surgeon: Kathi Der, MD;  Location: [facility];  Service: Gastroenterology;;   ESOPHAGOGASTRODUODENOSCOPY (EGD) WITH PROPOFOL N/A 10/17/2017   Procedure: ESOPHAGOGASTRODUODENOSCOPY (EGD) WITH PROPOFOL;  Surgeon: Kathi Der, MD;  Location: [facility];  Service: Gastroenterology;  Laterality: N/A;   IR ANGIOGRAM SELECTIVE EACH ADDITIONAL VESSEL  10/21/2017   IR ANGIOGRAM SELECTIVE EACH ADDITIONAL VESSEL  10/21/2017   IR ANGIOGRAM VISCERAL SELECTIVE  10/21/2017   IR EMBO ART  VEN HEMORR LYMPH EXTRAV  INC GUIDE ROADMAPPING  10/21/2017   IR US GUIDE VASC ACCESS RIGHT  10/21/2017   TONSILLECTOMY     HPI:  Pt is a 65 y.o. male who presented with generalized weakness and AMS. Per chart, pt's roommate called EMS after pt has been laying in bed for past 3 days, covered in feces and urine with worsening contractures. MRI brain 9/3: small remote cerebellar infarcts, but no acute or subacute insult. CXR 9/11: Cardiomegaly with mild pulmonary venous congestion and small left pleural effusion. Left >right airspace opacities, favoring atelectasis. Dx acute metabolic encephalopathy. Cortrak placed 9/9. PMH: MS, EtOH abuse, depression, BPD, GIB from gastric ulcer and esophagitis in 2019. BSE (10/26/17) significant for cognitively-based dysphagia, no s/sx of aspiration noted;  dys 3/thin diet.   Assessment / Plan / Recommendation Clinical Impression  Pt was seen for bedside swallow evaluation. Pt was alert and communicated verbally. However, his responses were often unrelated to questions and he demonstrated difficulty attending. Pt did not allow full oral inspection and did not follow the necessary commands for a complete oral mechanism exam. However, reduced dentition in poor condition was noted during limited oral inspection. Pt was resistant to oral intake. He vigorously shook his head when solid boluses were presented and blew trace puree that touched his lips. Pt allowed a straw to touch his lips, but did not demonstrate any sucking despite cues. Pt became increasingly agitated with SLP's encouragement and attempts. Additional trials were deferred. It appears that pt consumed 20% of dinner yesterday and no difficulty swallowing this has been documented in the chart or reported to the current RN, Debbie. Pt's current diet will therefore be continued until adequate assessment of swallowing can be conducted. SLP will follow pt. SLP Visit Diagnosis: Dysphagia, unspecified (R13.10)    Aspiration Risk  Mild aspiration risk    Diet Recommendation  (continue current diet until further assessment completed)   Medication Administration: Via alternative means Postural Changes: Seated upright at 90 degrees    Other  Recommendations Oral Care Recommendations: Oral care BID   Follow up Recommendations  (TBD)      Frequency and Duration min 2x/week  2 weeks       Prognosis Prognosis for Safe Diet Advancement: Fair Barriers to Reach Goals: Cognitive deficits;Motivation      Swallow Study   General Date of Onset: 01/01/21 HPI: Pt  is a 65 y.o. male who presented with generalized weakness and AMS. Per chart, pt's roommate called EMS after pt has been laying in bed for past 3 days, covered in feces and urine with worsening contractures. MRI brain 9/3: small remote  cerebellar infarcts, but no acute or subacute insult. CXR 9/11: Cardiomegaly with mild pulmonary venous congestion and small left pleural effusion. Left >right airspace opacities, favoring atelectasis. Dx acute metabolic encephalopathy. Cortrak placed 9/9. PMH: MS, EtOH abuse, depression, BPD, GIB from gastric ulcer and esophagitis in 2019. BSE (10/26/17) significant for cognitively-based dysphagia, no s/sx of aspiration noted; dys 3/thin diet. Type of Study: Bedside Swallow Evaluation Previous Swallow Assessment: See HPI Diet Prior to this Study: Thin liquids;Dysphagia 3 (soft) Temperature Spikes Noted: No Respiratory Status: Nasal cannula History of Recent Intubation: No Behavior/Cognition: Alert;Cooperative;Confused;Requires cueing Oral Cavity Assessment: Dry Oral Care Completed by SLP: No Oral Cavity - Dentition: Poor condition;Missing dentition Self-Feeding Abilities: Needs assist Patient Positioning: Upright in bed;Postural control adequate for testing Baseline Vocal Quality: Low vocal intensity Volitional Cough: Congested;Wet Volitional Swallow: Unable to elicit    Oral/Motor/Sensory Function Overall Oral Motor/Sensory Function:  (Difficult to assess)   Ice Chips Ice chips: Not tested   Thin Liquid Presentation: Straw Oral Phase Impairments:  (Pt did not form lips around straw or suck despite cues.)    Nectar Thick Nectar Thick Liquid: Not tested   Honey Thick Honey Thick Liquid: Not tested   Puree Puree: Impaired Presentation: Spoon Oral Phase Impairments: Poor awareness of bolus   Solid     Solid: Not tested     Tyresa Prindiville I. Vear Clock, MS, CCC-SLP Acute Rehabilitation Services Office number [phone] Pager [phone]  Scheryl Marten 01/02/2021,10:48 AM

## 2021-01-02 NOTE — Progress Notes (Signed)
Palliative Additional encounter-   Received call from APS - [name]. They are awaiting a court date- may be a week or two. They are also attempting to locate patient's son.  For now [name] states to continue all life supporting and maintaining interventions. He will provide update when either guardianship is established or patient's NOK has been located.   [name], AGNP-C Palliative Medicine  Additional time: 38 mins

## 2021-01-02 NOTE — Care Management Important Message (Signed)
Important Message  Patient Details  Name: [name] MRN: [id number] Date of Birth: 12-11-1955   Medicare Important Message Given:  Yes     Dorena Bodo 01/02/2021, 4:23 PM

## 2021-01-02 NOTE — Progress Notes (Signed)
Physical Therapy Treatment Patient Details Name: [name] MRN: [id number] DOB: 1956/04/15 Today's Date: 01/02/2021   History of Present Illness Pt is a 65 y.o. male who presented 12/23/20 with generalized weakness and AMS. Per chart, pt's roommate called EMS after pt has been laying in bed for past 3 days, covered in feces and urine with worsening contractures. Pending work-up. MRI showed small remote cerebellar infarcts, but no acute or subacute insult. Pt with acute metabolic encephalopathy. Pt deveoloped hypoxi 9/6 and place on NRB.  PMH: MS, EtOH abuse, depression, BPD, prior GIB from gastric ulcer and esophagitis in 2019    PT Comments    Pt limited in ability to progress toward goals by contracture from MS.  Spent 10-15 on AA/PROM to all 4 extremities, neck and with stretching to heel cords, hams, knees, pecs.  Pt left in full flat supine with towel roll at spine for further stretch.    Recommendations for follow up therapy are one component of a multi-disciplinary discharge planning process, led by the attending physician.  Recommendations may be updated based on patient status, additional functional criteria and insurance authorization.  Follow Up Recommendations  SNF;Supervision/Assistance - 24 hour     Equipment Recommendations  Wheelchair (measurements PT);Wheelchair cushion (measurements PT);Other (comment)    Recommendations for Other Services       Precautions / Restrictions Precautions Precautions: Fall Precaution Comments: bil leg contractures; pressure ulcer R lateral hip; high risk for skin breakdown; decreased A/PROM Bil UEs     Mobility  Bed Mobility Overal bed mobility: Needs Assistance Bed Mobility: Rolling Rolling: Total assist         General bed mobility comments: pt so contractured/stiff today, completed ROM and stretching after hydro session    Transfers                    Ambulation/Gait                 Stairs              Wheelchair Mobility    Modified Rankin (Stroke Patients Only) Modified Rankin (Stroke Patients Only) Modified Rankin: Severe disability     Balance                                            Cognition Arousal/Alertness: Lethargic Behavior During Therapy: Flat affect Overall Cognitive Status: Impaired/Different from baseline                     Current Attention Level: Sustained;Focused Memory: Decreased short-term memory Following Commands: Follows one step commands inconsistently;Follows one step commands with increased time Safety/Judgement: Decreased awareness of safety;Decreased awareness of deficits Awareness: Intellectual          Exercises Other Exercises Other Exercises: AA/PROM to UE's at fingers, wrists, elbows and shoulders,  AA/PROM to bil LE at ankles, knees and hips.  Stretching to heel cords, in knee extension, hamstrings, pecs and cervical ROM in general Other Exercises: Left pt in flat supine in good alignment with towel roll down length of his spine.    General Comments        Pertinent Vitals/Pain Pain Assessment: Faces Faces Pain Scale: Hurts little more Pain Location: legs with strethch, hips in general Pain Descriptors / Indicators: Grimacing;Moaning Pain Intervention(s): Monitored during session;Premedicated before session    Home Living  Prior Function            PT Goals (current goals can now be found in the care plan section) Acute Rehab PT Goals Patient Stated Goal: did not state PT Goal Formulation: With patient Time For Goal Achievement: 01/07/21 Potential to Achieve Goals: Fair Progress towards PT goals: Progressing toward goals    Frequency    Min 2X/week      PT Plan Current plan remains appropriate    Co-evaluation              AM-PAC PT "6 Clicks" Mobility   Outcome Measure  Help needed turning from your back to your side while in a flat bed  without using bedrails?: Total Help needed moving from lying on your back to sitting on the side of a flat bed without using bedrails?: Total Help needed moving to and from a bed to a chair (including a wheelchair)?: Total Help needed standing up from a chair using your arms (e.g., wheelchair or bedside chair)?: Total Help needed to walk in hospital room?: Total Help needed climbing 3-5 steps with a railing? : Total 6 Click Score: 6    End of Session Equipment Utilized During Treatment: Oxygen Activity Tolerance: Patient tolerated treatment well Patient left: in bed;with call bell/phone within reach;with bed alarm set Nurse Communication: Mobility status;Need for lift equipment PT Visit Diagnosis: Other abnormalities of gait and mobility (R26.89) Pain - part of body: Hip     Time: 1115-1130 PT Time Calculation (min) (ACUTE ONLY): 15 min  Charges:  $Therapeutic Exercise: 8-22 mins                     01/02/2021  Jacinto Halim., PT Acute Rehabilitation Services [phone]  (pager) [phone]  (office)   Eliseo Gum Shala Baumbach 01/02/2021, 11:58 AM

## 2021-01-02 NOTE — Discharge Instructions (Addendum)
Information on my medicine - ELIQUIS (apixaban)  Why was Eliquis prescribed for you? Eliquis was prescribed to treat blood clots that may have been found in the veins of your legs (deep vein thrombosis) or in your lungs (pulmonary embolism) and to reduce the risk of them occurring again.  What do You need to know about Eliquis ? The starting dose is 10 mg (two 5 mg tablets) taken TWICE daily for the FIRST SEVEN (7) DAYS, then on 01/06/2021  the dose is reduced to ONE 5 mg tablet taken TWICE daily.  Eliquis may be taken with or without food.   Try to take the dose about the same time in the morning and in the evening. If you have difficulty swallowing the tablet whole please discuss with your pharmacist how to take the medication safely.  Take Eliquis exactly as prescribed and DO NOT stop taking Eliquis without talking to the doctor who prescribed the medication.  Stopping may increase your risk of developing a new blood clot.  Refill your prescription before you run out.  After discharge, you should have regular check-up appointments with your healthcare provider that is prescribing your Eliquis.    What do you do if you miss a dose? If a dose of ELIQUIS is not taken at the scheduled time, take it as soon as possible on the same day and twice-daily administration should be resumed. The dose should not be doubled to make up for a missed dose.  Important Safety Information A possible side effect of Eliquis is bleeding. You should call your healthcare provider right away if you experience any of the following: Bleeding from an injury or your nose that does not stop. Unusual colored urine (red or dark brown) or unusual colored stools (red or black). Unusual bruising for unknown reasons. A serious fall or if you hit your head (even if there is no bleeding).  Some medicines may interact with Eliquis and might increase your risk of bleeding or clotting while on Eliquis. To help avoid  this, consult your healthcare provider or pharmacist prior to using any new prescription or non-prescription medications, including herbals, vitamins, non-steroidal anti-inflammatory drugs (NSAIDs) and supplements.  This website has more information on Eliquis (apixaban): http://[email or web address].com/eliquis/home

## 2021-01-02 NOTE — Consult Note (Signed)
Consultation Note Date: 01/02/2021   Patient Name: [name]  DOB: June 23, 1955  MRN: [id number]  Age / Sex: 65 y.o., male  PCP: Rinaldo Cloud, MD Referring Physician: Pennie Banter, DO  Reason for Consultation: Goals of care  HPI/Patient Profile: 65 y.o. male  with past medical history of MS, bipolar d/o, GIB,  admitted on 12/23/2020 after being found down in his bed for three days covered in feces and urine. Workup reveals aspiration pneumonia, bilateral PEs, respiratory failure (increased O2 needs yesterday- better today, chest xray shows cardiomegaly with mild pulmonary congestion, L>R opacities favoring atelectasis), MRI indicates progressing MS, small remote cerebellar infarcts, R hip pressure injury, malnutrition with cognitively based dysphagia- has Cortrak in place. APS report has been made and DSS is seeking guardianship. Palliative consulted for goals of care.   Primary Decision Maker OTHER - likely APS- guardianship is pending  Discussion: Chart reviewed.  Evaluated patient. He was able to tell me he is in the hospital. He spoke very quietly and was difficult to interpret at times. His answers to many of my questions were unrelated to the question. He does not appear to have capacity for medical decision making at this time.  Per chart review he was living independently prior to admission in a home with his roommate. Using a walker for ambulation.  He has not been taking his MS or psychiatric medications- reason is unknown. Palliative medicine saw him in 2019- at that time discussion was had with his ex-spouse and there was also indication that he has a son that he is estranged from.  I attempted to call the listed APS worker- [name]- left message requesting a return call.   SUMMARY OF RECOMMENDATIONS -Full scope full code for now -PMT will await return call from APS in efforts to establish  if they have guardianship and who is the legal decision maker for patient- in previous Palliative consult patient's ex-wife [name] and patient's roommate [name] were designated decision makers   Code Status/Advance Care Planning: Full code   Prognosis:   Unable to determine  Discharge Planning: To Be Determined  Primary Diagnoses: Present on Admission:  Acute encephalopathy  Bipolar I disorder (HCC)  Polysubstance abuse (HCC)  MS (multiple sclerosis) (HCC)  Alcohol abuse  Decubitus ulcer of hip  Rhabdomyolysis  Drug abuse, marijuana  Aspiration pneumonia (HCC)   Review of Systems  Unable to perform ROS: Mental status change   Physical Exam Vitals and nursing note reviewed.  Constitutional:      Comments: frail  Neurological:     Mental Status: He is alert. He is disoriented.     Motor: Weakness present.     Comments: Did not follow commands    Vital Signs: BP 105/61   Pulse 66   Temp 97.9 F (36.6 C) (Axillary)   Resp 20   Ht 5\' 11"  (1.803 m)   Wt (!) 185.7 kg   SpO2 94%   BMI 57.10 kg/m  Pain Scale: 0-10   Pain Score: 0-No  pain   SpO2: SpO2: 94 % O2 Device:SpO2: 94 % O2 Flow Rate: .O2 Flow Rate (L/min): 4 L/min  IO: Intake/output summary:  Intake/Output Summary (Last 24 hours) at 01/02/2021 1323 Last data filed at 01/02/2021 1230 Gross per 24 hour  Intake 3780 ml  Output 1750 ml  Net 2030 ml    LBM: Last BM Date: 12/31/20 Baseline Weight: Weight: 72.6 kg Most recent weight: Weight: (!) 185.7 kg     Palliative Assessment/Data:  PPS: 20%     Thank you for this consult. Palliative medicine will continue to follow and assist as needed.   Time In: 1248 Time Out: 1349 Time Total: 61 mins Greater than 50%  of this time was spent counseling and coordinating care related to the above assessment and plan.  Signed by: Ocie Bob, AGNP-C Palliative Medicine    Please contact Palliative Medicine Team phone at [phone] for questions and  concerns.  For individual provider: See Loretha Stapler

## 2021-01-03 ENCOUNTER — Other Ambulatory Visit: Payer: Self-pay | Admitting: Neurology

## 2021-01-03 DIAGNOSIS — G35 Multiple sclerosis: Secondary | ICD-10-CM

## 2021-01-03 LAB — GLUCOSE, CAPILLARY
Glucose-Capillary: 102 mg/dL — ABNORMAL HIGH (ref 70–99)
Glucose-Capillary: 104 mg/dL — ABNORMAL HIGH (ref 70–99)
Glucose-Capillary: 121 mg/dL — ABNORMAL HIGH (ref 70–99)
Glucose-Capillary: 131 mg/dL — ABNORMAL HIGH (ref 70–99)
Glucose-Capillary: 131 mg/dL — ABNORMAL HIGH (ref 70–99)
Glucose-Capillary: 96 mg/dL (ref 70–99)

## 2021-01-03 MED ORDER — OSMOLITE 1.5 CAL PO LIQD
840.0000 mL | ORAL | Status: DC
  Administered 2021-01-03: 840 mL
  Filled 2021-01-03 (×2): qty 948

## 2021-01-03 MED ORDER — BOOST / RESOURCE BREEZE PO LIQD CUSTOM
1.0000 | Freq: Three times a day (TID) | ORAL | Status: DC
  Administered 2021-01-03: 1 via ORAL

## 2021-01-03 MED ORDER — PROSOURCE TF PO LIQD
45.0000 mL | Freq: Three times a day (TID) | ORAL | Status: DC
  Administered 2021-01-03 – 2021-01-04 (×2): 45 mL
  Filled 2021-01-03 (×2): qty 45

## 2021-01-03 NOTE — Progress Notes (Signed)
PROGRESS NOTE    [name]   YQM:[id number]  DOB: 07-08-1955  PCP: Rinaldo Cloud, MD    DOA: 12/23/2020 LOS: 9    Brief Narrative / Hospital Course to Date:   [name] is a 65 year old male with past medical history significant for multiple sclerosis, EtOH abuse, bipolar disorder, history of GI bleed 2/2 gastric ulcer/esophagitis, tobacco use disorder who presented to [facility] ED via EMS for confusion and being found down by his roommate for likely 3 days.  Roommate reports generalized weakness and confusion for several days.  Apparently the patient was laying in his bed for 3 days covered in feces and urine; however refusing any type of assistance.  Per EMS, patient living in disheveled/poor environment; APS report was filed.  Admitted for further evaluation and management.   Patient had an aspiration event and was started on Unasyn.  Due to increasing dyspnea and oxygen requirement, CTA chest was obtained and showed bilateral acute PE's.  Started on heparin and PCCM was consulted.    Assessment & Plan   Principal Problem:   Acute respiratory failure with hypoxia (HCC) Active Problems:   MS (multiple sclerosis) (HCC)   Alcohol abuse   Bipolar I disorder (HCC)   Elevated brain natriuretic peptide (BNP) level   Acute encephalopathy   Polysubstance abuse (HCC)   Decubitus ulcer of hip   Rhabdomyolysis   Protein-calorie malnutrition, severe   Acute pulmonary embolism (HCC)   Drug abuse, marijuana   Aspiration pneumonia (HCC)   Acute respiratory failure with hypoxia due to Acute bilateral pulmonary emboli -with imaging evidence of right heart strain.  Felt too high risk for tPA or catheter-directed intervention.  Treated with heparin gtt >> Eliquis. Aspiration pneumonia with left basilar consolidation on imaging. Shock multifactorial septic vs circulatory.  Required Levophed, weaned off and hemodynamically stable;  9/13 - weaned from 4 >> 1-2 L/min O2 --PCCM  consulted --Continue Eliquis --Completed course of Unasyn and Doxy --Continue Duonebs --Supplemental oxygen to maintain sats above 90%, wean as tolerated --Aspiration precautions --Chest xray repeat 9/11 stable  Dyspagia, Inadequate PO intake Severe malnutrition - POA related to chronic illness MS, EtOH.  Pt has severe fat depletion and muscle atrophy. Dietitian consulted. Body mass index is 57.1 kg/m. Dietary supplements, tube feeds, vitamins per RD. On tube feeds by CorTrak currently Dysphagia 3 diet Nocturnal Tube feeds and encourage PO diet during the day  Hypernatremia - Resolved with free water flushes. Monitor BMP and adjust free water accordingly.  Reduce FW flushes to 150 cc q6h (from q2h)  Acute metabolic encephalopathy -POA, improving Presented by EMS after being found down by roommate x3 days.  Associated generalized weakness.  Initially no evidence of infection, afebrile no leukocytosis.  UDS was positive for THC, EtOH less than 10.  CT head with cerebral atrophy but no acute findings.  UA negative.  Ammonia level normal.  Lactate normal.  Negative for COVID-19 and influenza A/B.   Unclear etiology but possibly due to alcohol abuse vs hypoxia given above.  Now likely with hospital delirium.  Unclear baseline cognitive status. --soft restraints for safety of pt and staff as needed --Delirium precautions:     -Lights and TV off, minimize interruptions at night    -Blinds open and lights on during day    -Glasses/hearing aid with patient    -Frequent reorientation    -PT/OT when able    -Avoid sedation medications/Beers list medications  Rhabdomyolysis, mild, POA -patient was found down  for 3 days.  Presented with a right hip wound and elevated [id number]. --Treated with IV fluids, now off --Supportive care and follow renal function  Right hip wound, POA -wound noted to have eschar blistering.  Seen by wound care.  Previously discussed with general surgery, recommended  Santyl and hydrotherapy. -- PT following for hydrotherapy --Continue Santyl daily  Abnormal blood culture staff epidermidis -grew in 2/4 bottles of blood cultures from admission.  Suspect this is contamination.  Initially was started on bank prior to [facility] ID result, since discontinued. Patient remains afebrile with no leukocytosis --Follow repeat blood cultures of 9/4: No growth to date --Monitor clinically  Alcohol use disorder -uncertain type or quantity of alcohol.  On admission patient reported that he drinks "3 packs/day" --Monitor on CIWA protocol --Continue multivitamin folate and thiamine --TOC consulted for subs abuse resources  Tobacco use disorder -reports he smokes 3 packs/day.  Counseled extensively on need for smoking cessation. --Nicotine patch  History of multiple sclerosis, relapsing remitting type -follows with neurology Dr. Epimenio Foot.  Last seen 05/05/2020.  On Tecfidera since 2013; patient reports not taking medications just prior to admission.  MRI brain with and without contrast showed advanced white matter disease that is progressed from 2019 image with no acute or subacute findings --Hold off on resuming Tecfidera since he has been noncompliant outpatient --Outpatient follow-up with neuro  History bipolar disorder History of depression with suicidal ideations -stable Home regimen appears to be sertraline 50 mg daily, olanzapine 10 mg at bedtime.  Recently noncompliant with any medications. --Resumed on sertraline 25 mg daily and olanzapine 10 mg nightly  Adult failure to thrive -patient presented via EMS after being found down times several days by roommate.  Per EMS report, house noted in deplorable unlivable conditions, patient with right hip wound and just shoveled, covered in urine and feces --APS report was filed and currently seeking court appointed guardianship --Requires 24 hour care, SNF placement pending --TOC following  Generalized weakness /physical  debility /deconditioning  PT and OT recommend SNF placement.   TOC following.  Goals of Care / Pt lacks capacity for decision-making.  APS was contacted on admission, guardianship hearing planned. 9/13 - palliative care heard from pt's ex-spouse, she agrees to be surrogate decision-maker, plans to discuss with son and other, considering hospice. See Palliative note.     Patient BMI: Body mass index is 57.1 kg/m.   DVT prophylaxis: SCDs Start: 12/24/20 0312 apixaban (ELIQUIS) tablet 10 mg  apixaban (ELIQUIS) tablet 5 mg   Diet:  Diet Orders (From admission, onward)     Start     Ordered   12/26/20 1349  DIET DYS 3 Room service appropriate? Yes with Assist; Fluid consistency: Thin  Diet effective now       Question Answer Comment  Room service appropriate? Yes with Assist   Fluid consistency: Thin      12/26/20 1348              Code Status: Full Code   Subjective 01/03/21    Patient awake today on rounds when seen.  He appears angry, and attempts to strike me when I approach bedside, unable to make contact in mitten restraints.  When I ask questions, he repeated says "shut up".  Still with very poor vocal projection.  No acute events reported.  Disposition Plan & Communication   Status is: Inpatient  Remains inpatient appropriate because:Inpatient level of care appropriate due to severity of illness, remains encephalopathic and requiring  artifical nutrition to meet needs.    Dispo: The patient is from: Home              Anticipated d/c is to: SNF              Patient currently is not medically stable to d/c.   Difficult to place patient No   Consults, Procedures, Significant Events   Consultants:  PCCM  Procedures:  None  Antimicrobials:  Anti-infectives (From admission, onward)    Start     Dose/Rate Route Frequency Ordered Stop   12/30/20 2200  doxycycline (VIBRA-TABS) tablet 100 mg  Status:  Discontinued        100 mg Oral Every 12 hours 12/30/20 1054  12/30/20 1629   12/30/20 2200  doxycycline (VIBRA-TABS) tablet 100 mg        100 mg Per Tube Every 12 hours 12/30/20 1629 01/01/21 2300   12/29/20 1000  doxycycline (VIBRAMYCIN) 100 mg in sodium chloride 0.9 % 250 mL IVPB        100 mg 125 mL/hr over 120 Minutes Intravenous Every 12 hours 12/28/20 1349 12/31/20 1215   12/28/20 1215  azithromycin (ZITHROMAX) 500 mg in sodium chloride 0.9 % 250 mL IVPB  Status:  Discontinued        500 mg 250 mL/hr over 60 Minutes Intravenous Every 24 hours 12/28/20 1125 12/28/20 1349   12/27/20 0900  Ampicillin-Sulbactam (UNASYN) 3 g in sodium chloride 0.9 % 100 mL IVPB        3 g 200 mL/hr over 30 Minutes Intravenous Every 6 hours 12/27/20 0803 01/02/21 2145   12/25/20 1600  vancomycin (VANCOREADY) IVPB 1250 mg/250 mL  Status:  Discontinued        1,250 mg 166.7 mL/hr over 90 Minutes Intravenous Every 24 hours 12/24/20 1544 12/25/20 0727   12/24/20 1630  vancomycin (VANCOREADY) IVPB 1500 mg/300 mL        1,500 mg 150 mL/hr over 120 Minutes Intravenous  Once 12/24/20 1544 12/24/20 1859         Micro    Objective   Vitals:   01/03/21 0728 01/03/21 0904 01/03/21 1148 01/03/21 1600  BP: 111/67  110/66 112/67  Pulse: 89  85 82  Resp: (!) 22  20 (!) 23  Temp: 98.4 F (36.9 C)  98.4 F (36.9 C) 98 F (36.7 C)  TempSrc: Axillary  Axillary Axillary  SpO2: 92% 92% 94% 94%  Weight:      Height:        Intake/Output Summary (Last 24 hours) at 01/03/2021 1636 Last data filed at 01/03/2021 1119 Gross per 24 hour  Intake 1777.51 ml  Output 2050 ml  Net -272.49 ml   Filed Weights   12/28/20 0205 01/01/21 0529 01/02/21 0625  Weight: 66.1 kg 65.8 kg (!) 185.7 kg    Physical Exam:  General exam: awake, alert, no acute distress, chronically ill-appearing, underweight, refuses to be examined, attempts to strike me but in restraint/mittens. HEENT: CorTrak NGT in place, nasal cannula in place Respiratory system: normal respiratory effort, on 2 L/min  supplemental oxygen. Cardiovascular system: normal S1/S2, RRR, no pedal edema.   Central nervous system: grossly non-focal exam, pt agitated unable to examine Extremities: soft mitten restraints on BLE's, no peripheral edema   Labs   Data Reviewed: I have personally reviewed following labs and imaging studies  CBC: Recent Labs  Lab 12/27/20 2050 12/28/20 0055 12/29/20 0224 12/30/20 0305 12/31/20 0041  WBC 9.4 9.6 10.3 6.3 5.0  HGB 10.9* 11.3* 9.9* 9.1* 9.3*  HCT 32.4* 35.0* 30.4* 28.4* 29.7*  MCV 91.5 93.3 93.0 95.3 95.5  PLT 230 222 241 239 285   Basic Metabolic Panel: Recent Labs  Lab 12/28/20 0055 12/29/20 0224 12/29/20 0942 12/31/20 0041 01/01/21 0123 01/02/21 0041  NA 142 144  --  148* 148* 143  K 4.0 3.8  --  3.9 4.4 4.7  CL 111 113*  --  120* 120* 112*  CO2 23 22  --  21* 21* 23  GLUCOSE 96 118*  --  156* 129* 107*  BUN 17 18  --  25* 22 20  CREATININE 1.09 1.01  --  0.84 0.82 0.69  CALCIUM 8.2* 8.2*  --  8.3* 8.1* 8.2*  MG 2.0  --  2.2 2.1 1.9  --   PHOS  --   --  3.4 3.2 3.6  --    GFR: Estimated Creatinine Clearance: 155.6 mL/min (by C-G formula based on SCr of 0.69 mg/dL). Liver Function Tests: Recent Labs  Lab 12/28/20 0055  AST 62*  ALT 33  ALKPHOS 80  BILITOT 1.5*  PROT 5.4*  ALBUMIN 2.2*   No results for input(s): LIPASE, AMYLASE in the last 168 hours. No results for input(s): AMMONIA in the last 168 hours.  Coagulation Profile: No results for input(s): INR, PROTIME in the last 168 hours.  Cardiac Enzymes: No results for input(s): CKTOTAL, CKMB, CKMBINDEX, TROPONINI in the last 168 hours.  BNP (last 3 results) No results for input(s): PROBNP in the last 8760 hours. HbA1C: No results for input(s): HGBA1C in the last 72 hours. CBG: Recent Labs  Lab 01/02/21 2114 01/03/21 0002 01/03/21 0612 01/03/21 0730 01/03/21 1150  GLUCAP 136* 131* 121* 131* 102*   Lipid Profile: No results for input(s): CHOL, HDL, LDLCALC, TRIG, CHOLHDL,  LDLDIRECT in the last 72 hours. Thyroid Function Tests: No results for input(s): TSH, T4TOTAL, FREET4, T3FREE, THYROIDAB in the last 72 hours.  Anemia Panel: No results for input(s): VITAMINB12, FOLATE, FERRITIN, TIBC, IRON, RETICCTPCT in the last 72 hours.  Sepsis Labs: Recent Labs  Lab 12/28/20 0434  LATICACIDVEN 1.4    Recent Results (from the past 240 hour(s))  Culture, blood (routine x 2)     Status: None   Collection Time: 12/25/20  8:06 AM   Specimen: BLOOD  Result Value Ref Range Status   Specimen Description BLOOD BLOOD RIGHT HAND  Final   Special Requests   Final    BOTTLES DRAWN AEROBIC AND ANAEROBIC Blood Culture adequate volume   Culture   Final    NO GROWTH 5 DAYS Performed at [facility], [address] [place], [place] [address]    Report Status 12/30/2020 FINAL  Final  Culture, blood (routine x 2)     Status: None   Collection Time: 12/25/20  8:07 AM   Specimen: BLOOD  Result Value Ref Range Status   Specimen Description BLOOD BLOOD RIGHT HAND  Final   Special Requests   Final    BOTTLES DRAWN AEROBIC AND ANAEROBIC Blood Culture adequate volume   Culture   Final    NO GROWTH 5 DAYS Performed at [facility], [address] [place], [place] [address]    Report Status 12/30/2020 FINAL  Final  MRSA Next Gen by PCR, Nasal     Status: None   Collection Time: 12/28/20  2:05 AM   Specimen: Nasal Mucosa; Nasal Swab  Result Value Ref Range Status  MRSA by PCR Next Gen NOT DETECTED NOT DETECTED Final    Comment: (NOTE) The GeneXpert MRSA Assay (FDA approved for NASAL specimens only), is one component of a comprehensive MRSA colonization surveillance program. It is not intended to diagnose MRSA infection nor to guide or monitor treatment for MRSA infections. Test performance is not FDA approved in patients less than 6 years old. Performed at [facility], [address], [place], [place] [address]       Imaging Studies   No  results found.   Medications   Scheduled Meds:  apixaban  10 mg Per Tube BID   Followed by   Melene Muller ON 01/06/2021] apixaban  5 mg Oral BID   vitamin C  500 mg Per Tube Daily   collagenase   Topical Daily   feeding supplement  1 Container Oral TID BM   feeding supplement (PROSource TF)  45 mL Per Tube TID   folic acid  1 mg Per Tube Daily   free water  150 mL Per Tube Q6H   ipratropium-albuterol  3 mL Nebulization BID   mouth rinse  15 mL Mouth Rinse BID   multivitamin with minerals  1 tablet Per Tube Daily   OLANZapine  10 mg Per Tube QHS   polyethylene glycol  17 g Per Tube Daily   sertraline  25 mg Per Tube Daily   thiamine  100 mg Per Tube Daily   vitamin A  10,000 Units Per Tube Daily   Continuous Infusions:  sodium chloride 10 mL/hr at 01/02/21 1400   feeding supplement (OSMOLITE 1.5 CAL)         LOS: 9 days    Time spent: 25 minutes with > 50% spent at bedside and in coordination of care     Pennie Banter, DO [facility]  01/03/2021, 4:36 PM      If 7PM-7AM, please contact night-coverage. How to contact the [facility] Attending or Consulting provider 7A - 7P or covering provider during after hours 7P -7A, for this patient?    Check the care team in [facility] and look for a) attending/consulting [facility] provider listed and b) the [facility] team listed Log into [email or web address] and use [place]'s universal password to access. If you do not have the password, please contact the hospital operator. Locate the [facility] provider you are looking for under [facility] and page to a number that you can be directly reached. If you still have difficulty reaching the provider, please page the [facility] (Director on Call) for the Hospitalists listed on amion for assistance.

## 2021-01-03 NOTE — Plan of Care (Signed)

## 2021-01-03 NOTE — Progress Notes (Addendum)
Daily Progress Note   Patient Name: [name]       Date: 01/03/2021 DOB: July 01, 1955  Age: 65 y.o. MRN#: [id number] Attending Physician: Pennie Banter, DO Primary Care Physician: Rinaldo Cloud, MD Admit Date: 12/23/2020  Reason for Consultation/Follow-up: Establishing goals of care  Patient Profile/HPI:  65 y.o. male  with past medical history of MS, bipolar d/o, GIB,  admitted on 12/23/2020 after being found down in his bed for three days covered in feces and urine. Workup reveals aspiration pneumonia, bilateral PEs, respiratory failure (increased O2 needs yesterday- better today, chest xray shows cardiomegaly with mild pulmonary congestion, L>R opacities favoring atelectasis), MRI indicates progressing MS, small remote cerebellar infarcts, R hip pressure injury, malnutrition with cognitively based dysphagia- has Cortrak in place. APS report has been made and DSS is seeking guardianship. Palliative consulted for goals of care.   Subjective: On exam today Emma is more awake than yesterday, however, he remains confused. He is very weak, he cannot life his right arm at all and lifts his left arm minimally. Received call from patient's ex-spouse- Doren Kaspar. She is the mother to Elena's son- [name]. [name] shared that [name] is unable to be a Runner, broadcasting/film/video for [employer]. [name] has the closest established relationship to Joven and is willing to be his Runner, broadcasting/film/video.  By [place] statutes [name] is able to be a Runner, broadcasting/film/video.  I updated [name] on Riyansh's recent acute illness.  Also discussed that Tavion is unable to make decisions for himself.  [name] shares that Omero has had little interest in medical care in the past.  She is certain that he would not want to rely on artificial  life supportive measures.  I discussed options for continued aggressive life prolonging care vs transition to comfort measures only and Hospice.  [name] believes that Brodey would prefer comfort measures however, she wishes to discuss this with [name] and [name] before making final decisions.  I also provided [name] with APS worker- Molly Maduro Lee's contact information.     Physical Exam Vitals and nursing note reviewed.  Constitutional:      Comments: Cachectic, frail  Neurological:     Mental Status: He is disoriented.            Vital Signs: BP 112/67 (BP Location: Right Arm)  Pulse 82   Temp 98 F (36.7 C) (Axillary)   Resp (!) 23   Ht 5\' 11"  (1.803 m)   Wt (!) 185.7 kg   SpO2 94%   BMI 57.10 kg/m  SpO2: SpO2: 94 % O2 Device: O2 Device: Nasal Cannula O2 Flow Rate: O2 Flow Rate (L/min): 2 L/min  Intake/output summary:  Intake/Output Summary (Last 24 hours) at 01/03/2021 1608 Last data filed at 01/03/2021 1119 Gross per 24 hour  Intake 1777.51 ml  Output 2050 ml  Net -272.49 ml   LBM: Last BM Date: 01/03/21 Baseline Weight: Weight: 72.6 kg Most recent weight: Weight: (!) 185.7 kg       Palliative Assessment/Data: PPS: 20%      Patient Active Problem List   Diagnosis Date Noted   Drug abuse, marijuana 12/28/2020   Aspiration pneumonia (HCC) 12/28/2020   Acute pulmonary embolism (HCC)    Protein-calorie malnutrition, severe 12/26/2020   Decubitus ulcer of hip 12/24/2020   Rhabdomyolysis 12/24/2020   Polysubstance abuse (HCC) 08/12/2019   Substance induced mood disorder (HCC) 08/12/2019   Vitamin D deficiency 09/16/2018   Altered mental status    Acute encephalopathy    Goals of care, counseling/discussion    Palliative care encounter    GI bleed 10/18/2017   Symptomatic anemia    Upper GI bleeding 10/16/2017   Coccygeal pain, acute 04/08/2017   Elevated brain natriuretic peptide (BNP) level 04/06/2016   Bipolar I disorder (HCC) 04/05/2016   Lateral  femoral cutaneous neuropathy, 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   Numbness 01/16/2016   Alcohol abuse 07/16/2015   Other fatigue 06/29/2014   Gait disturbance 06/29/2014   Urinary urgency 06/29/2014   High risk medication use 06/29/2014   MS (multiple sclerosis) (HCC) 10/31/2011   Acute respiratory failure with hypoxia (HCC) 10/29/2011   Dysthymia 05/11/2011    Palliative Care Assessment & Plan    Assessment/Recommendations/Plan  Continue current level of care PMT will followup with 05/13/2011 tomorrow   Code Status: Full code  Prognosis:  Unable to determine  Discharge Planning: To Be Determined  Care plan was discussed with patient's friend [name] and care team.   Thank you for allowing the Palliative Medicine Team to assist in the care of this patient.  Total time:  35 mins Prolonged billing:      Greater than 50%  of this time was spent counseling and coordinating care related to the above assessment and plan.  [name], AGNP-C Palliative Medicine   Please contact Palliative Medicine Team phone at [phone] for questions and concerns.

## 2021-01-03 NOTE — Progress Notes (Signed)
Occupational Therapy Treatment Patient Details Name: [name] MRN: [id number] DOB: 12-04-1955 Today's Date: 01/03/2021   History of present illness Pt is a 65 y.o. male who presented 12/23/20 with generalized weakness and AMS. Per chart, pt's roommate called EMS after pt has been laying in bed for past 3 days, covered in feces and urine with worsening contractures. Pending work-up. MRI showed small remote cerebellar infarcts, but no acute or subacute insult. Pt with acute metabolic encephalopathy. Pt deveoloped hypoxi 9/6 and place on NRB.  PMH: MS, EtOH abuse, depression, BPD, prior GIB from gastric ulcer and esophagitis in 2019   OT comments  This 65 yo male admitted with above presents to acute OT with confusion as where he is,why he is here, and where he is going. He did not participate much stating he didn't want to work on eating or grooming, but he was ready to get back to bed. He said all he needed was RW and he could do it (pt cannot stand and he is total A +2 for transfers. He will continue to benefit from acute OT with follow at SNF.   Recommendations for follow up therapy are one component of a multi-disciplinary discharge planning process, led by the attending physician.  Recommendations may be updated based on patient status, additional functional criteria and insurance authorization.    Follow Up Recommendations  SNF;Supervision/Assistance - 24 hour    Equipment Recommendations  Other (comment) (TBD next venue)       Precautions / Restrictions Precautions Precautions: Fall Precaution Comments: bil leg contractures; pressure ulcer R lateral hip; high risk for skin breakdown; decreased A/PROM Bil UEs Restrictions Weight Bearing Restrictions: No       Mobility Bed Mobility Overal bed mobility: Needs Assistance Bed Mobility: Sit to Supine    Sit to supine: Total assist;+2 for physical assistance       Transfers Overall transfer level: Needs assistance Equipment  used: Rolling walker (2 wheeled) Transfers: Squat Pivot Transfers  Squat pivot transfers: Total assist;+2 physical assistance     General transfer comment: 2 person A with gait belt and chair pad under him (as a sling) to pivot him from recliner to chair with arm of recliner dropped down    Balance Overall balance assessment: Needs assistance Sitting-balance support: Single extremity supported;Feet supported Sitting balance-Leahy Scale: Poor                                  ADL either performed or assessed with clinical judgement   ADL Overall ADL's : Needs assistance/impaired   Eating/Feeding Details (indicate cue type and reason): Food on try table in front of him--he said he did not want anything to eat, he did eat one bite of orange sherbet for me Grooming: Wash/dry face;Sitting Grooming Details (indicate cue type and reason): in recliner--he could reach all of his face, but did a "spotty" job because he did not want to wash his Art therapist Transfer: Total assistance;+2 for physical assistance Toilet Transfer Details (indicate cue type and reason): simulated recliner to bed (use of gait belt and chair pad that was under him)                 Vision Baseline Vision/History: 1 Wears glasses Ability to See in Adequate Light: 0 Adequate  Cognition Arousal/Alertness: Awake/alert Behavior During Therapy: Flat affect Overall Cognitive Status: Impaired/Different from baseline Area of Impairment: Orientation;Attention;Following commands;Awareness;Problem solving                 Orientation Level: Time;Place (October then July; did know it was 2022) Current Attention Level: Focused Memory: Decreased short-term memory   Awareness: Intellectual Problem Solving: Slow processing;Decreased initiation;Requires verbal cues;Difficulty sequencing;Requires tactile cues General Comments: "where am I, why am I here, aren't I  suppose to be going somewhere"        Exercises Other Exercises Other Exercises: Attempted to have patient do Bil UE exercises with me, but he did not want to do them. He can lift his RUE to ~90 degrees of flexion AROM and LUE to ~70 degrees AROM--then he would not do any more than that as far as repetitions or other UE exercises.      General Comments pt with large stool in the bed, dependent for hygiene and rolling/transfers    Pertinent Vitals/ Pain       Pain Assessment: Faces Faces Pain Scale: Hurts little more Pain Location: "my butt hurts" Pain Descriptors / Indicators: Aching;Sore Pain Intervention(s): Limited activity within patient's tolerance;Monitored during session;Repositioned                                                          Frequency  Min 2X/week        Progress Toward Goals  OT Goals(current goals can now be found in the care plan section)  Progress towards OT goals: Not progressing toward goals - comment (due to confusion and decreased ability cognitively and physcially to follow commands)  Acute Rehab OT Goals Patient Stated Goal: did not state OT Goal Formulation: Patient unable to participate in goal setting Time For Goal Achievement: 01/08/21 Potential to Achieve Goals: Fair  Plan Discharge plan remains appropriate       AM-PAC OT "6 Clicks" Daily Activity     Outcome Measure   Help from another person eating meals?: Total Help from another person taking care of personal grooming?: A Lot Help from another person toileting, which includes using toliet, bedpan, or urinal?: Total Help from another person bathing (including washing, rinsing, drying)?: Total Help from another person to put on and taking off regular upper body clothing?: Total Help from another person to put on and taking off regular lower body clothing?: Total 6 Click Score: 7    End of Session Equipment Utilized During Treatment: Gait belt  OT  Visit Diagnosis: Other abnormalities of gait and mobility (R26.89);Muscle weakness (generalized) (M62.81);Other symptoms and signs involving cognitive function;Pain Pain - part of body:  (butt)   Activity Tolerance Patient tolerated treatment well   Patient Left in bed;with call bell/phone within reach (with NT finishing up getting he settled)             Time: 0814-4818 OT Time Calculation (min): 25 min  Charges: OT General Charges $OT Visit: 1 Visit OT Treatments $Self Care/Home Management : 8-22 mins $Therapeutic Exercise: 8-22 mins  [name], OTR/L Acute [employer] Pager [phone] Office [phone]    [name] 01/03/2021, 4:21 PM

## 2021-01-03 NOTE — Progress Notes (Signed)
Physical Therapy Treatment Patient Details Name: [name] MRN: [id number] DOB: 1955-05-07 Today's Date: 01/03/2021   History of Present Illness Pt is a 65 y.o. male who presented 12/23/20 with generalized weakness and AMS. Per chart, pt's roommate called EMS after pt has been laying in bed for past 3 days, covered in feces and urine with worsening contractures. Pending work-up. MRI showed small remote cerebellar infarcts, but no acute or subacute insult. Pt with acute metabolic encephalopathy. Pt deveoloped hypoxi 9/6 and place on NRB.  PMH: MS, EtOH abuse, depression, BPD, prior GIB from gastric ulcer and esophagitis in 2019    PT Comments    Pt with muffled, low, soft tone voice making vocalization difficult to hear. Pt with no active participation or command follow. Pt dependent for mobility and ALDs, pt unaware he had a BM. Acute PT to cont to follow.    Recommendations for follow up therapy are one component of a multi-disciplinary discharge planning process, led by the attending physician.  Recommendations may be updated based on patient status, additional functional criteria and insurance authorization.  Follow Up Recommendations  SNF;Supervision/Assistance - 24 hour     Equipment Recommendations  Wheelchair (measurements PT);Wheelchair cushion (measurements PT);Other (comment)    Recommendations for Other Services       Precautions / Restrictions Precautions Precautions: Fall Restrictions Weight Bearing Restrictions: No     Mobility  Bed Mobility Overal bed mobility: Needs Assistance Bed Mobility: Rolling Rolling: Total assist Sidelying to sit: Total assist       General bed mobility comments: pt with no command follow or active participation in todays eval. total assist for LE and trunk management    Transfers Overall transfer level: Needs assistance Equipment used: Rolling walker (2 wheeled) Transfers: Stand Pivot Transfers;Sit to/from Stand Sit to Stand:  Total assist;+2 physical assistance Stand pivot transfers: Total assist;+2 physical assistance       General transfer comment: pt with no initiation but didn't resist, pt with minimal WBing on feet and bilat knees in flexion  Ambulation/Gait             General Gait Details: unable at this time   Stairs             Wheelchair Mobility    Modified Rankin (Stroke Patients Only) Modified Rankin (Stroke Patients Only) Pre-Morbid Rankin Score: Slight disability Modified Rankin: Severe disability     Balance Overall balance assessment: Needs assistance Sitting-balance support: Feet supported;No upper extremity supported Sitting balance-Leahy Scale: Poor         Standing balance comment: pt unable to stand without physical assist                            Cognition Arousal/Alertness: Lethargic Behavior During Therapy: Flat affect Overall Cognitive Status: Impaired/Different from baseline Area of Impairment: Orientation;Attention;Following commands;Awareness;Problem solving                 Orientation Level: Disoriented to;Time;Situation (pt very soft spoken and difficult to understand, but was able to state name but nothing else) Current Attention Level: Focused Memory: Decreased short-term memory Following Commands: Follows one step commands inconsistently (pt with no command follow today) Safety/Judgement: Decreased awareness of safety;Decreased awareness of deficits Awareness: Intellectual (pt stated "I have to pee" however was unaware of bowel incontinence) Problem Solving: Slow processing;Decreased initiation;Requires verbal cues;Difficulty sequencing;Requires tactile cues General Comments: Pt mumbling unintelligibly. Did not follow 1 step commands.  Exercises      General Comments General comments (skin integrity, edema, etc.): pt with large stool in the bed, dependent for hygiene and rolling/transfers      Pertinent  Vitals/Pain Pain Assessment: Faces Faces Pain Scale: Hurts little more Pain Location: legs with PROM to hips and knees Pain Descriptors / Indicators: Grimacing;Moaning Pain Intervention(s): Monitored during session    Home Living                      Prior Function            PT Goals (current goals can now be found in the care plan section) Progress towards PT goals: Progressing toward goals    Frequency    Min 2X/week      PT Plan Current plan remains appropriate    Co-evaluation              AM-PAC PT "6 Clicks" Mobility   Outcome Measure  Help needed turning from your back to your side while in a flat bed without using bedrails?: Total Help needed moving from lying on your back to sitting on the side of a flat bed without using bedrails?: Total Help needed moving to and from a bed to a chair (including a wheelchair)?: Total Help needed standing up from a chair using your arms (e.g., wheelchair or bedside chair)?: Total Help needed to walk in hospital room?: Total Help needed climbing 3-5 steps with a railing? : Total 6 Click Score: 6    End of Session   Activity Tolerance: Patient limited by lethargy Patient left: with call bell/phone within reach;in chair;with chair alarm set;with nursing/sitter in room Nurse Communication: Mobility status;Need for lift equipment PT Visit Diagnosis: Other abnormalities of gait and mobility (R26.89) Pain - Right/Left:  (bil) Pain - part of body: Hip     Time: 5465-6812 PT Time Calculation (min) (ACUTE ONLY): 28 min  Charges:  $Therapeutic Activity: 23-37 mins                     [name], PT, DPT Acute Rehabilitation Services Pager #: [phone] Office #: [phone]    Iona Hansen 01/03/2021, 1:28 PM

## 2021-01-03 NOTE — Progress Notes (Signed)
Nutrition Follow-up  DOCUMENTATION CODES:   Severe malnutrition in context of chronic illness  INTERVENTION:   -D/c Ensure Enlive due to poor acceptance -MVI with minerals daily -Boost Breeze po TID, each supplement provides 250 kcal and 9 grams of protein  -Magic cup TID with meals, each supplement provides 290 kcal and 9 grams of protein  -Initiate 48 hour calorie count -Transition to nocturnal TF:   Osmolite 1.5 @ 70 ml/hr via cortrak tube over 12 hour period  45 ml Prosource TF TID.    150 ml free water flush every 6 hours  Tube feeding regimen provides 1380 kcal (69% of needs), 86 grams of protein, and 640 ml of H2O. Total free water: 1240 ml daily  NUTRITION DIAGNOSIS:   Severe Malnutrition related to chronic illness (MS, ETOH) as evidenced by severe fat depletion, severe muscle depletion.  Ongoing  GOAL:   Patient will meet greater than or equal to 90% of their needs  Progressing   MONITOR:   PO intake, Supplement acceptance, Labs, Skin, TF tolerance  REASON FOR ASSESSMENT:   Consult Assessment of nutrition requirement/status, Poor PO  ASSESSMENT:   Pt with PMH of multiple sclerosis, ETOH abuse, bipolar disorder, GI bleed 2/2 gastric ulcer/esophagitis, tobacco abuse admitted after being found down by his roommate. Pt lying in his bed for 3 days covered in feces and urine but refusing care.  9/6- hydrotherapy initiated 9/7- CT of chest revealed acute PE 9/9- cortrak placed (gastric), TF initiated 9/12- hydrotherapy d/c  Reviewed I/O's: +903 ml x 24 hours and +8.8 L since admission  UOP: 2 L x 24 hours  Pt unavailable at time of visit.    Per discussion with RN, plan to transition to nocturnal TF to help promote PO intake.   Intake remains poor. Noted documented meal completions 5-25%. He is refusing Ensure ENlive supplements.   Palliative care following for goals of care discussions. Pt awaiting court date for guardianship.   Medications reviewed  and include vitamin C, miralax, thiamine, and vitamin A.   Labs reviewed: CBGS: 102-136 (inpatient orders for glycemic control are none).    Diet Order:   Diet Order             DIET DYS 3 Room service appropriate? Yes with Assist; Fluid consistency: Thin  Diet effective now                   EDUCATION NEEDS:   Not appropriate for education at this time  Skin:  Skin Assessment: Skin Integrity Issues: Skin Integrity Issues:: Unstageable Unstageable: R hip  Last BM:  01/03/21  Height:   Ht Readings from Last 1 Encounters:  12/23/20 5\' 11"  (1.803 m)    Weight:   Wt Readings from Last 1 Encounters:  01/02/21 (!) 185.7 kg   BMI:  Body mass index is 57.1 kg/m.  Estimated Nutritional Needs:   Kcal:  2000-2200  Protein:  110-125 grams  Fluid:  > 2 L/day    03/04/21, RD, LDN, CDCES Registered Dietitian II Certified Diabetes Care and Education Specialist Please refer to [facility] for RD and/or RD on-call/weekend/after hours pager

## 2021-01-04 LAB — BASIC METABOLIC PANEL
Anion gap: 7 (ref 5–15)
BUN: 23 mg/dL (ref 8–23)
CO2: 22 mmol/L (ref 22–32)
Calcium: 8.3 mg/dL — ABNORMAL LOW (ref 8.9–10.3)
Chloride: 110 mmol/L (ref 98–111)
Creatinine, Ser: 0.76 mg/dL (ref 0.61–1.24)
GFR, Estimated: >60 mL/min (ref >60)
Glucose, Bld: 124 mg/dL — ABNORMAL HIGH (ref 70–99)
Potassium: 4.4 mmol/L (ref 3.5–5.1)
Sodium: 139 mmol/L (ref 135–145)

## 2021-01-04 LAB — GLUCOSE, CAPILLARY
Glucose-Capillary: 113 mg/dL — ABNORMAL HIGH (ref 70–99)
Glucose-Capillary: 128 mg/dL — ABNORMAL HIGH (ref 70–99)
Glucose-Capillary: 138 mg/dL — ABNORMAL HIGH (ref 70–99)
Glucose-Capillary: 93 mg/dL (ref 70–99)

## 2021-01-04 MED ORDER — MORPHINE SULFATE (PF) 2 MG/ML IV SOLN
2.0000 mg | INTRAVENOUS | Status: DC | PRN
Start: 2021-01-04 — End: 2021-01-06

## 2021-01-04 MED ORDER — MIDAZOLAM HCL 2 MG/2ML IJ SOLN: 2.0000 mg | INTRAMUSCULAR | Status: DC | PRN

## 2021-01-04 MED ORDER — GLYCOPYRROLATE 1 MG PO TABS
1.0000 mg | ORAL_TABLET | ORAL | Status: DC | PRN
  Filled 2021-01-04: qty 1

## 2021-01-04 MED ORDER — POLYVINYL ALCOHOL 1.4 % OP SOLN
1.0000 [drp] | Freq: Four times a day (QID) | OPHTHALMIC | Status: DC | PRN
  Filled 2021-01-04: qty 15

## 2021-01-04 MED ORDER — BIOTENE DRY MOUTH MT LIQD: 15.0000 mL | OROMUCOSAL | Status: DC | PRN

## 2021-01-04 MED ORDER — GLYCOPYRROLATE 0.2 MG/ML IJ SOLN: 0.2000 mg | INTRAMUSCULAR | Status: DC | PRN

## 2021-01-04 MED ORDER — ONDANSETRON HCL 4 MG/2ML IJ SOLN: 4.0000 mg | Freq: Four times a day (QID) | INTRAMUSCULAR | Status: DC | PRN

## 2021-01-04 MED ORDER — HALOPERIDOL LACTATE 5 MG/ML IJ SOLN: 0.5000 mg | INTRAMUSCULAR | Status: DC | PRN

## 2021-01-04 MED ORDER — HALOPERIDOL LACTATE 2 MG/ML PO CONC
0.5000 mg | ORAL | Status: DC | PRN
  Filled 2021-01-04: qty 0.3

## 2021-01-04 MED ORDER — ONDANSETRON 4 MG PO TBDP: 4.0000 mg | ORAL_TABLET | Freq: Four times a day (QID) | ORAL | Status: DC | PRN

## 2021-01-04 MED ORDER — HALOPERIDOL 0.5 MG PO TABS
0.5000 mg | ORAL_TABLET | ORAL | Status: DC | PRN
  Filled 2021-01-04: qty 1

## 2021-01-04 NOTE — TOC Progression Note (Signed)
Transition of Care [facility]) - Progression Note    Patient Details  Name: ZHANE BLUITT MRN: [id number] Date of Birth: Aug 16, 1955  [facility] CM/SW Contact  Mearl Latin, LCSW Phone Number: 01/04/2021, 3:06 PM  Clinical Narrative:    CSW received consult to place patient in residential hospice. CSW spoke with patient's ex-wife, [name] [phone]) as she is the only available next of kin able to make decisions for patient per Palliative care. She is in agreement with hospice placement and reported preference of [facility]. CSW sent referral for review with [facility].   Expected Discharge Plan: Hospice Medical Facility Barriers to Discharge: Hospice Bed not available  Expected Discharge Plan and Services Expected Discharge Plan: Hospice Medical Facility In-house Referral: Clinical Social Work Discharge Planning Services: CM Consult Post Acute Care Choice: Skilled Nursing Facility Living arrangements for the past 2 months: Mobile Home                                       Social Determinants of Health (SDOH) Interventions    Readmission Risk Interventions No flowsheet data found.

## 2021-01-04 NOTE — Progress Notes (Signed)
Civil engineer, contracting [facility]) Hospital Liaison note.    Received request from [facility] manager for family interest in [facility]. Spoke with pt's former spouse [name] and visited at bedside.  Chart and pt information have been reviewed by [facility] physician.  [facility] eligibility confirmed.  [facility] is unable to offer a room today. Hospital Liaison will follow up tomorrow or sooner if a room becomes available. Please do not hesitate to call with questions.    Thank you for the opportunity to participate in this patient's care.  Gillian Scarce, BSN, RN [facility] Liaison (listed on [place] under Hospice/[facility])    [phone] (24h on call)

## 2021-01-04 NOTE — Progress Notes (Signed)
PROGRESS NOTE    [name]   NKN:[id number]  DOB: 1956-01-09  PCP: Rinaldo Cloud, MD    DOA: 12/23/2020 LOS: 10    Brief Narrative / Hospital Course to Date:   [name] is a 65 year old male with past medical history significant for multiple sclerosis, EtOH abuse, bipolar disorder, history of GI bleed 2/2 gastric ulcer/esophagitis, tobacco use disorder who presented to [facility] ED via EMS for confusion and being found down by his roommate for likely 3 days.  Roommate reports generalized weakness and confusion for several days.  Apparently the patient was laying in his bed for 3 days covered in feces and urine; however refusing any type of assistance.  Per EMS, patient living in disheveled/poor environment; APS report was filed.  Admitted for further evaluation and management.   Patient had an aspiration event and was started on Unasyn.  Due to increasing dyspnea and oxygen requirement, CTA chest was obtained and showed bilateral acute PE's.  Started on heparin and PCCM was consulted.    Subjective 01/04/21    Patient awake today on rounds when seen.  He appears angry, and attempts to strike me when I approach bedside, unable to make contact in mitten restraints.  When I ask questions, he repeated says "shut up".  Still with very poor vocal projection.  No acute events reported. Consults, Procedures, Significant Events   Consultants:  PCCM  Procedures:  None  Antimicrobials:  Anti-infectives (From admission, onward)    Start     Dose/Rate Route Frequency Ordered Stop   12/30/20 2200  doxycycline (VIBRA-TABS) tablet 100 mg  Status:  Discontinued        100 mg Oral Every 12 hours 12/30/20 1054 12/30/20 1629   12/30/20 2200  doxycycline (VIBRA-TABS) tablet 100 mg        100 mg Per Tube Every 12 hours 12/30/20 1629 01/01/21 2300   12/29/20 1000  doxycycline (VIBRAMYCIN) 100 mg in sodium chloride 0.9 % 250 mL IVPB        100 mg 125 mL/hr over 120 Minutes Intravenous Every 12 hours  12/28/20 1349 12/31/20 1215   12/28/20 1215  azithromycin (ZITHROMAX) 500 mg in sodium chloride 0.9 % 250 mL IVPB  Status:  Discontinued        500 mg 250 mL/hr over 60 Minutes Intravenous Every 24 hours 12/28/20 1125 12/28/20 1349   12/27/20 0900  Ampicillin-Sulbactam (UNASYN) 3 g in sodium chloride 0.9 % 100 mL IVPB        3 g 200 mL/hr over 30 Minutes Intravenous Every 6 hours 12/27/20 0803 01/02/21 2145   12/25/20 1600  vancomycin (VANCOREADY) IVPB 1250 mg/250 mL  Status:  Discontinued        1,250 mg 166.7 mL/hr over 90 Minutes Intravenous Every 24 hours 12/24/20 1544 12/25/20 0727   12/24/20 1630  vancomycin (VANCOREADY) IVPB 1500 mg/300 mL        1,500 mg 150 mL/hr over 120 Minutes Intravenous  Once 12/24/20 1544 12/24/20 1859         Micro    Objective   Vitals:   01/04/21 0500 01/04/21 0756 01/04/21 0808 01/04/21 1157  BP: 96/63 93/62  107/84  Pulse: 78 81  84  Resp: (!) 23 (!) 22  (!) 21  Temp: 98.8 F (37.1 C) 98.8 F (37.1 C)  98.6 F (37 C)  TempSrc: Axillary Axillary  Axillary  SpO2: 93% 96% 96% 98%  Weight: 67 kg     Height:  Intake/Output Summary (Last 24 hours) at 01/04/2021 1836 Last data filed at 01/04/2021 1817 Gross per 24 hour  Intake 1191.83 ml  Output 2100 ml  Net -908.17 ml   Filed Weights   01/01/21 0529 01/02/21 0625 01/04/21 0500  Weight: 65.8 kg (!) 185.7 kg 67 kg    Physical Exam:  General exam: awake, alert, no acute distress, chronically ill-appearing, underweight, refuses to be examined, attempts to strike me but in restraint/mittens. HEENT: CorTrak NGT in place, nasal cannula in place Respiratory system: normal respiratory effort, on 2 L/min supplemental oxygen. Cardiovascular system: normal S1/S2, RRR, no pedal edema.   Central nervous system: grossly non-focal exam, pt agitated unable to examine Extremities: soft mitten restraints on BLE's, no peripheral edema   Labs   Data Reviewed: I have personally reviewed  following labs and imaging studies  CBC: Recent Labs  Lab 12/29/20 0224 12/30/20 0305 12/31/20 0041  WBC 10.3 6.3 5.0  HGB 9.9* 9.1* 9.3*  HCT 30.4* 28.4* 29.7*  MCV 93.0 95.3 95.5  PLT 241 239 285   Basic Metabolic Panel: Recent Labs  Lab 12/29/20 0224 12/29/20 0942 12/31/20 0041 01/01/21 0123 01/02/21 0041 01/04/21 0107  NA 144  --  148* 148* 143 139  K 3.8  --  3.9 4.4 4.7 4.4  CL 113*  --  120* 120* 112* 110  CO2 22  --  21* 21* 23 22  GLUCOSE 118*  --  156* 129* 107* 124*  BUN 18  --  25* 22 20 23   CREATININE 1.01  --  0.84 0.82 0.69 0.76  CALCIUM 8.2*  --  8.3* 8.1* 8.2* 8.3*  MG  --  2.2 2.1 1.9  --   --   PHOS  --  3.4 3.2 3.6  --   --    GFR: Estimated Creatinine Clearance: 87.2 mL/min (by C-G formula based on SCr of 0.76 mg/dL). Liver Function Tests: No results for input(s): AST, ALT, ALKPHOS, BILITOT, PROT, ALBUMIN in the last 168 hours.  No results for input(s): LIPASE, AMYLASE in the last 168 hours. No results for input(s): AMMONIA in the last 168 hours.  Coagulation Profile: No results for input(s): INR, PROTIME in the last 168 hours.  Cardiac Enzymes: No results for input(s): CKTOTAL, CKMB, CKMBINDEX, TROPONINI in the last 168 hours.  BNP (last 3 results) No results for input(s): PROBNP in the last 8760 hours. HbA1C: No results for input(s): HGBA1C in the last 72 hours. CBG: Recent Labs  Lab 01/03/21 2040 01/04/21 0036 01/04/21 0431 01/04/21 0759 01/04/21 1156  GLUCAP 96 138* 128* 113* 93   Lipid Profile: No results for input(s): CHOL, HDL, LDLCALC, TRIG, CHOLHDL, LDLDIRECT in the last 72 hours. Thyroid Function Tests: No results for input(s): TSH, T4TOTAL, FREET4, T3FREE, THYROIDAB in the last 72 hours.  Anemia Panel: No results for input(s): VITAMINB12, FOLATE, FERRITIN, TIBC, IRON, RETICCTPCT in the last 72 hours.  Sepsis Labs: No results for input(s): PROCALCITON, LATICACIDVEN in the last 168 hours.   Recent Results (from  the past 240 hour(s))  MRSA Next Gen by PCR, Nasal     Status: None   Collection Time: 12/28/20  2:05 AM   Specimen: Nasal Mucosa; Nasal Swab  Result Value Ref Range Status   MRSA by PCR Next Gen NOT DETECTED NOT DETECTED Final    Comment: (NOTE) The GeneXpert MRSA Assay (FDA approved for NASAL specimens only), is one component of a comprehensive MRSA colonization surveillance program. It is not intended to diagnose  MRSA infection nor to guide or monitor treatment for MRSA infections. Test performance is not FDA approved in patients less than 28 years old. Performed at [facility], [address], [place], [place] [address]       Imaging Studies   No results found.   Medications   Scheduled Meds:  mouth rinse  15 mL Mouth Rinse BID   Continuous Infusions:  sodium chloride 10 mL/hr at 01/02/21 1400    Assessment & Plan   Principal Problem:   Acute respiratory failure with hypoxia (HCC) Active Problems:   MS (multiple sclerosis) (HCC)   Alcohol abuse   Bipolar I disorder (HCC)   Elevated brain natriuretic peptide (BNP) level   Acute encephalopathy   Polysubstance abuse (HCC)   Decubitus ulcer of hip   Rhabdomyolysis   Protein-calorie malnutrition, severe   Acute pulmonary embolism (HCC)   Drug abuse, marijuana   Aspiration pneumonia (HCC)   Acute respiratory failure with hypoxia due to Acute bilateral pulmonary emboli -with imaging evidence of right heart strain.  Felt too high risk for tPA or catheter-directed intervention.  Treated with heparin gtt >> Eliquis. Aspiration pneumonia with left basilar consolidation on imaging. Shock multifactorial septic vs circulatory.  Required Levophed, weaned off and hemodynamically stable;  9/13 - weaned from 4 >> 1-2 L/min O2 --PCCM consulted --Continue Eliquis --Completed course of Unasyn and Doxy --Continue Duonebs --Supplemental oxygen to maintain sats above 90%, wean as tolerated --Aspiration  precautions --Chest xray repeat 9/11 stable -Patient is now comfort care.  Dyspagia, Inadequate PO intake Severe malnutrition - POA related to chronic illness MS, EtOH.  Pt has severe fat depletion and muscle atrophy. Dietitian consulted. Body mass index is 57.1 kg/m. Dietary supplements, tube feeds, vitamins per RD. On tube feeds by CorTrak currently Dysphagia 3 diet -Patient is now comfort care.  Hypernatremia - Resolved with free water flushes. Monitor BMP and adjust free water accordingly.  Reduce FW flushes to 150 cc q6h (from q2h) -Patient is now comfort care.  Acute metabolic encephalopathy -POA, improving Presented by EMS after being found down by roommate x3 days.  Associated generalized weakness.  Initially no evidence of infection, afebrile no leukocytosis.  UDS was positive for THC, EtOH less than 10.  CT head with cerebral atrophy but no acute findings.  UA negative.  Ammonia level normal.  Lactate normal.  Negative for COVID-19 and influenza A/B.   Unclear etiology but possibly due to alcohol abuse vs hypoxia given above.  Now likely with hospital delirium.  Unclear baseline cognitive status. --soft restraints for safety of pt and staff as needed --Delirium precautions:     -Lights and TV off, minimize interruptions at night    -Blinds open and lights on during day    -Glasses/hearing aid with patient    -Frequent reorientation    -PT/OT when able    -Avoid sedation medications/Beers list medications -Patient is now comfort care.  Rhabdomyolysis, mild, POA -patient was found down for 3 days.  Presented with a right hip wound and elevated CK2476. --Treated with IV fluids, now off --Supportive care and follow renal function -Patient is now comfort care.  Right hip wound, POA -wound noted to have eschar blistering.  Seen by wound care.  Previously discussed with general surgery, recommended Santyl and hydrotherapy. -- PT following for hydrotherapy --Continue Santyl  daily -Patient is now comfort care.  Abnormal blood culture staff epidermidis -grew in 2/4 bottles of blood cultures from admission.  Suspect this  is contamination.  Initially was started on bank prior to [facility] ID result, since discontinued. Patient remains afebrile with no leukocytosis --Follow repeat blood cultures of 9/4: No growth to date --Monitor clinically -Patient is now comfort care.  Alcohol use disorder -uncertain type or quantity of alcohol.  On admission patient reported that he drinks "3 packs/day" --Monitor on CIWA protocol --Continue multivitamin folate and thiamine --TOC consulted for subs abuse resources -Patient is now comfort care.  Tobacco use disorder -reports he smokes 3 packs/day.  Counseled extensively on need for smoking cessation. --Nicotine patch -Patient is now comfort care.  History of multiple sclerosis, relapsing remitting type -follows with neurology Dr. Epimenio Foot.  Last seen 05/05/2020.  On Tecfidera since 2013; patient reports not taking medications just prior to admission.  MRI brain with and without contrast showed advanced white matter disease that is progressed from 2019 image with no acute or subacute findings --Hold off on resuming Tecfidera since he has been noncompliant outpatient --Patient is now comfort care.  History bipolar disorder History of depression with suicidal ideations -stable Home regimen appears to be sertraline 50 mg daily, olanzapine 10 mg at bedtime.  Recently noncompliant with any medications. --Resumed on sertraline 25 mg daily and olanzapine 10 mg nightly -Patient is now comfort care.  Adult failure to thrive -patient presented via EMS after being found down times several days by roommate.  Per EMS report, house noted in deplorable unlivable conditions, patient with right hip wound and just shoveled, covered in urine and feces --APS report was filed and currently seeking court appointed guardianship --Requires 24 hour care, SNF  placement pending --TOC following -Patient is now comfort care.  Generalized weakness /physical debility /deconditioning  PT and OT recommend SNF placement.   TOC following. -Patient is now comfort care.  Goals of Care / Pt lacks capacity for decision-making.  APS was contacted on admission, guardianship hearing planned. 9/13 - palliative care heard from pt's ex-spouse, she agrees to be surrogate decision-maker, plans to discuss with son and other, considering hospice. -Patient is now comfort care.See Palliative note.    I have seen and examined this patient myself. I have spent 30 minutes in her evaluation and care.  Patient BMI: Body mass index is 20.6 kg/m.    LOS: 10 days    Diet:  Diet Orders (From admission, onward)     Start     Ordered   12/26/20 1349  DIET DYS 3 Room service appropriate? Yes with Assist; Fluid consistency: Thin  Diet effective now       Question Answer Comment  Room service appropriate? Yes with Assist   Fluid consistency: Thin      12/26/20 1348              Code Status: DNR  Time spent: 25 minutes with > 50% spent at bedside and in coordination of care   Disposition Plan & Communication   Status is: Inpatient  Remains inpatient appropriate because:Inpatient level of care appropriate due to severity of illness, remains encephalopathic and requiring artifical nutrition to meet needs.    Dispo: The patient is from: Home              Anticipated d/c is to: residential hospice              Awaiting hospice placement.   Difficult to place patient No  Josefa Syracuse, DO [facility]  01/04/2021, 6:36 PM

## 2021-01-04 NOTE — Progress Notes (Signed)
Pt  flagged as Yellow [place] for increased Resp Rate, pt more restless and agitated than usual. Assessment stable. Dimmed room lights, encouraged pt to try to sleep.  01/04/21 0000  Assess: MEWS Score  Temp 99 F (37.2 C)  BP 97/64  Pulse Rate 87  ECG Heart Rate 87  Resp (!) 26  Level of Consciousness Alert  SpO2 94 %  O2 Device Nasal Cannula  Patient Activity (if Appropriate) In bed  O2 Flow Rate (L/min) 2 L/min  Assess: MEWS Score  MEWS Temp 0  MEWS Systolic 1  MEWS Pulse 0  MEWS RR 2  MEWS LOC 0  MEWS Score 3  MEWS Score Color Yellow  Assess: if the MEWS score is Yellow or Red  Were vital signs taken at a resting state? No  Focused Assessment Change from prior assessment (see assessment flowsheet) (pt is restless, easily agitated, remains confused/disoriented to all but self)  Early Detection of Sepsis Score *See Row Information* Low  MEWS guidelines implemented *See Row Information* Yes  Treat  Pain Scale 0-10  Pain Score 0  Take Vital Signs  Increase Vital Sign Frequency  Yellow: Q 2hr X 2 then Q 4hr X 2, if remains yellow, continue Q 4hrs  Escalate  MEWS: Escalate Yellow: discuss with charge nurse/RN and consider discussing with provider and RRT  Notify: Charge Nurse/RN  Name of Charge Nurse/RN Notified Matt  Date Charge Nurse/RN Notified 01/04/21  Time Charge Nurse/RN Notified 0100

## 2021-01-04 NOTE — Progress Notes (Signed)
Nutrition Brief Note  Chart reviewed. Pt now transitioning to comfort care. Per PCT notes, family desires residential hospice. RD will d/c calorie count due to comfort measures.  No further nutrition interventions planned at this time.  Please re-consult as needed.   Levada Schilling, RD, LDN, CDCES Registered Dietitian II Certified Diabetes Care and Education Specialist Please refer to [facility] for RD and/or RD on-call/weekend/after hours pager

## 2021-01-04 NOTE — Progress Notes (Signed)
Daily Progress Note   Patient Name: [name]       Date: 01/04/2021 DOB: 1956/02/16  Age: 65 y.o. MRN#: [id number] Attending Physician: Fran Lowes, DO Primary Care Physician: Rinaldo Cloud, MD Admit Date: 12/23/2020  Reason for Consultation/Follow-up: Establishing goals of care  Patient Profile/HPI:  65 y.o. male  with past medical history of MS, bipolar d/o, GIB,  admitted on 12/23/2020 after being found down in his bed for three days covered in feces and urine. Workup reveals aspiration pneumonia, bilateral PEs, respiratory failure (increased O2 needs yesterday- better today, chest xray shows cardiomegaly with mild pulmonary congestion, L>R opacities favoring atelectasis), MRI indicates progressing MS, small remote cerebellar infarcts, R hip pressure injury, malnutrition with cognitively based dysphagia- has Cortrak in place. APS report has been made and DSS is seeking guardianship but court date has not been set. Palliative consulted for goals of care.   Subjective: [name] does not arouse to my voice or touch. He is eating and drinking minimally. Having periods of agitation and continued confusion per chart review.  I spoke with [name]. She spoke with [name] and [name] last night and they have all agreed that [name] "would hate" to live the way he is living now. They wish for transition to full comfort measures only. Stop tube feeding, medications, provide comfort medications and allow for natural dying process with comfort and dignity. [name] is interested in seeking inpatient Hospice placement.     Physical Exam Vitals and nursing note reviewed.  Constitutional:      Appearance: He is ill-appearing.     Comments: Cachectic, frail  Neurological:     Comments: Sleeping- does not arouse  to voice or touch            Vital Signs: BP 107/84 (BP Location: Right Wrist)   Pulse 84   Temp 98.6 F (37 C) (Axillary)   Resp (!) 21   Ht 5\' 11"  (1.803 m)   Wt 67 kg   SpO2 98%   BMI 20.60 kg/m  SpO2: SpO2: 98 % O2 Device: O2 Device: Nasal Cannula O2 Flow Rate: O2 Flow Rate (L/min): 3 L/min  Intake/output summary:  Intake/Output Summary (Last 24 hours) at 01/04/2021 1258 Last data filed at 01/04/2021 0700 Gross per 24 hour  Intake 1191.83 ml  Output 1100 ml  Net 91.83 ml    LBM: Last BM Date: 01/03/21 Baseline Weight: Weight: 72.6 kg Most recent weight: Weight: 67 kg       Palliative Assessment/Data: PPS: 20%      Patient Active Problem List   Diagnosis Date Noted   Drug abuse, marijuana 12/28/2020   Aspiration pneumonia (HCC) 12/28/2020   Acute pulmonary embolism (HCC)    Protein-calorie malnutrition, severe 12/26/2020   Decubitus ulcer of hip 12/24/2020   Rhabdomyolysis 12/24/2020   Polysubstance abuse (HCC) 08/12/2019   Substance induced mood disorder (HCC) 08/12/2019   Vitamin D deficiency 09/16/2018   Altered mental status    Acute encephalopathy    Goals of care, counseling/discussion    Palliative care encounter    GI bleed 10/18/2017   Symptomatic anemia    Upper GI bleeding 10/16/2017   Coccygeal pain, acute 04/08/2017   Elevated brain natriuretic peptide (BNP) level 04/06/2016   Bipolar I disorder (HCC) 04/05/2016   Lateral femoral cutaneous neuropathy, 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   Numbness 01/16/2016   Alcohol abuse 07/16/2015   Other fatigue 06/29/2014   Gait disturbance 06/29/2014   Urinary urgency 06/29/2014   High risk medication use 06/29/2014   MS (multiple sclerosis) (HCC) 10/31/2011   Acute respiratory failure with hypoxia (HCC) 10/29/2011   Dysthymia 05/11/2011    Palliative Care Assessment & Plan    Assessment/Recommendations/Plan  Transition to full comfort measures only TOC referral to request inpatient Hospice placement I  placed call to APS to give update  Code Status: DNR  Prognosis:  < 2 weeks due to aspiration, pnumonia, bilateral PE's, not eating or drinking sufficiently all in the setting of progressive MS with plans for comfort measures only  Discharge Planning: Hospice facility  Care plan was discussed with patient's friend [name] and care team.   Thank you for allowing the Palliative Medicine Team to assist in the care of this patient.  Total time:  35 mins Prolonged billing:      Greater than 50%  of this time was spent counseling and coordinating care related to the above assessment and plan.  Ocie Bob, AGNP-C Palliative Medicine   Please contact Palliative Medicine Team phone at [phone] for questions and concerns.

## 2021-01-04 NOTE — Consult Note (Addendum)
WOC Nurse wound follow up Patient receiving care in [facility] 5W11 PT Hydrotherapy discontinued on 01/02/21. Wound is healing, eschar has been removed and is forming early/partial granulation. Patient will be converting over to comfort cart therefore we will discontinue the Santyl daily and change to moistened saline gauze and secured with foam dressing. Change daily.  WOC will not follow but are available to this patient and the medical team if needed.   Monitor the wound area(s) for worsening of condition such as: Signs/symptoms of infection, increase in size, development of or worsening of odor, development of pain, or increased pain at the affected locations.   Notify the medical team if any of these develop.  Thank you for the consult. WOC nurse will not follow at this time.   Please re-consult the WOC team if needed.  Renaldo Reel Katrinka Blazing, MSN, RN, CMSRN, Angus Seller, [facility] Wound Treatment Associate Pager [phone]

## 2021-01-05 NOTE — Progress Notes (Signed)
Civil engineer, contracting [facility])  [facility] has a bed for [name] and his family has accepted the bed.  RN staff, please call report to [phone].  Transport can be arranged once necessary consents are received and [facility] will update TOC manager when transport can be arranged.   Thank you, Wallis Bamberg RN, BSN, CCRN [facility] Liaison

## 2021-01-05 NOTE — Progress Notes (Signed)
Pt prepared for d/c to hospice, [facility]. IV x2 L FA left in place. Skin intact except as charted in most recent assessments. Vitals are stable. Report called and given to [facility]. Pt to be transported by ambulance service, waiting on PTAR.

## 2021-01-05 NOTE — Progress Notes (Signed)
2155- PTAR arrived to take patient to hospice facility, [facility]. AVS printed and given to EMS transport.

## 2021-01-05 NOTE — TOC Transition Note (Signed)
Transition of Care [facility]) - CM/SW Discharge Note   Patient Details  Name: [name] MRN: [id number] Date of Birth: [birth year]  Transition of Care [facility]) CM/SW Contact:  Mearl Latin, LCSW Phone Number: 01/05/2021, 2:42 PM   Clinical Narrative:    Patient will DC to: [facility] Anticipated DC date: 01/05/21 Family notified: [name] and Hansel Starling w/APS Transport by: Sharin Mons   Per MD patient ready for DC to [facility]. RN to call report prior to discharge [phone]). RN, patient, patient's family, and facility notified of DC. Discharge Summary sent to facility. DC packet on chart. Ambulance transport requested for patient.   CSW will sign off for now as social work intervention is no longer needed. Please consult [place] again if new needs arise.     Final next level of care: Hospice Medical Facility Barriers to Discharge: Barriers Resolved   Patient Goals and CMS Choice Patient states their goals for this hospitalization and ongoing recovery are:: comfort CMS Medicare.gov Compare Post Acute Care list provided to:: Patient Represenative (must comment) (ex-wife [name]) Choice offered to / list presented to :  (Ex-wife)  Discharge Placement                Patient to be transferred to facility by: PTAR Name of family member notified: [name] Patient and family notified of of transfer: 01/05/21  Discharge Plan and Services In-house Referral: Clinical Social Work Discharge Planning Services: [employer] Consult Post Acute Care Choice: Skilled Nursing Facility                               Social Determinants of Health (SDOH) Interventions     Readmission Risk Interventions No flowsheet data found.

## 2021-01-05 NOTE — Discharge Summary (Addendum)
Physician Discharge Summary  [name] ZOX:[id number] DOB: [birth year] DOA: 12/23/2020  PCP: Rinaldo Cloud, MD  Admit date: 12/23/2020 Discharge date: 01/05/2021  Recommendations for Outpatient Follow-up:  Discharge to residential hospice at [facility]  Discharge Diagnoses:  Acute hypoxic respiratory failure due to bilateral pulmonary emboli with right heart strain. Aspiration pneumonia with left basilar consolidate. Dysphagia Multiple Sclerosis - relapsing/remitting Severe Protein Calorie Malnutrition Hypernatremia Dehydration Rhabdomyolysis Right hip wound Dementia Tobacco abuse Alcohol abuse Sepsis upon admission with hypotension, fever, tachycardia, and tachypnea  Discharge Condition: Serious Disposition: Residential hospice  Diet recommendation: Pleasure feeds  Filed Weights   01/01/21 0529 01/02/21 0625 01/04/21 0500  Weight: 65.8 kg (!) 185.7 kg 67 kg    History of present illness:  [name] is a 66 y.o. male with medical history significant of MS, EtOH abuse, BPD, prior GIB from gastric ulcer and esophagitis in 2019.   Pt presents to ED for several day h/o generalized weakness, AMS.   Reportedly pt room mate called EMS because pt has been laying in bed for past 3 days, covered in feces and urine.  However pt had been refusing assistance.  He has been unable to get up out of bed and ambulate due to worsening contractures compared to baseline.  Has had poor PO intake during that time.   Denies pain anywhere to me.  Pt is confused though.     ED Course: Per RN: covered in feces initially, non-bloody stool, no evidence of GIB.   CPK 2476 T.BILI 1.5 AST 74 alt 35. Lactate 1.8  Hospital Course:  The patient was admitted to a telemetry bed on a CIWA protocol. He was supplemented with folic acid and thiamine. TOC was consulted to substance abuse resources. He was given a nicotine patch. Tecfidera was held due to the patient's encephalopathy. Wound care was  consulted and the patient was treated with hydrotherapy and Santyl daily. MRI brain with and without contrast was obtained and demonstrated advanced white matter disease that had progressed since previous MRI in 2019 as well as small vessel ischemia. There was no evidence of acute or subacute insult. He was found to have aspiration pneumonia and septic shock. PCCM was consulted. The patient received Unasyn and Zithromax. He also received duonebs. Dut to hypotension, the patient received Levophed initially per PCCM. This was weaned off by 12/29/2020. He was placed on aspiration precautions and he was given supplemental O2 to maintain saturations greater than 90%. The patient was placed on delirium precautions. He received IV fluids due to Rhabdomyolysis that resolved after a couple of days. Blood cultures grew out staph epidermidis from 2/4 bottles. This was felt to be a contaminant. As repeat blood cultures had no growth, Vancomycin was discontinued.  As the patient had been found down by his roommate several times by his room aite, APS report was filed and initially PT/OT recommended SNF placement. Guardianship process was initiated. However, the patient's ex-wife, [name] [phone]) as she is the only available next of kin able to make decisions for patient per Palliative care. She is in agreement with hospice placement and reported preference of [facility].  The patient was made comfort care in accordance with Yvonne's wishes on 01/04/2021. He will discharge to [facility] place later today.  Today's assessment: S: The patient is awake and responsive today. He is oriented to person only. No new complaints. O: Vitals:  Vitals:   01/04/21 1957 01/05/21 1240  BP: 100/61 96/61  Pulse: 75 82  Resp: 16 15  Temp: 98.8 F (37.1 C) 98.5 F (36.9 C)  SpO2: 97% (!) 88%    Constitutional:  The patient is awake and alert. No acute distress. Respiratory:  CTA bilaterally, no w/r/r.  Respiratory effort  normal. No retractions or accessory muscle use Cardiovascular:  RRR, no m/r/g No LE extremity edema   Normal pedal pulses Abdomen:  Abdomen appears normal; no tenderness or masses Scaffoid No hernias No HSM Musculoskeletal:  Digits/nails: no clubbing, cyanosis, petechiae, infection exam of joints, bones, muscles of at least one of following: head/neck, RUE, LUE, RLE, LLE   cachectic No tenderness, masses Skin:  No rashes, lesions, ulcers palpation of skin: no induration or nodules Neurologic:  Patient is unable to cooperate with exam. Psychiatric:  Patient is unable to cooperate with exam.  Discharge Instructions: Discharge to [facility] for end of life care.  Allergies as of 01/05/2021   No Known Allergies      Medication List     STOP taking these medications    folic acid 1 MG tablet Commonly known as: FOLVITE   OLANZapine 10 MG tablet Commonly known as: ZYPREXA   sertraline 50 MG tablet Commonly known as: Zoloft   Tecfidera 240 MG Cpdr Generic drug: Dimethyl Fumarate   traMADol 50 MG tablet Commonly known as: ULTRAM   Vitamin D (Ergocalciferol) 1.25 MG (50000 UNIT) Caps capsule Commonly known as: DRISDOL        No Known Allergies  The results of significant diagnostics from this hospitalization (including imaging, microbiology, ancillary and laboratory) are listed below for reference.    Significant Diagnostic Studies: CT HEAD WO CONTRAST ( )  Result Date: 12/24/2020 CLINICAL DATA:  Mental status change, unknown cause EXAM: CT HEAD WITHOUT CONTRAST TECHNIQUE: Contiguous axial images were obtained from the base of the skull through the vertex without intravenous contrast. COMPARISON:  10/31/2017 FINDINGS: Brain: There is atrophy and chronic small vessel disease changes. No acute intracranial abnormality. Specifically, no hemorrhage, hydrocephalus, mass lesion, acute infarction, or significant intracranial injury. Vascular: No hyperdense vessel or  unexpected calcification. Skull: No acute calvarial abnormality. Sinuses/Orbits: No acute findings Other: None IMPRESSION: Atrophy, chronic microvascular disease. No acute intracranial abnormality. Electronically Signed   By: Charlett Nose M.D.   On: 12/24/2020 00:57   CT Angio Chest Pulmonary Embolism (PE) W or WO Contrast  Result Date: 12/28/2020 CLINICAL DATA:  PE suspected, high prob EXAM: CT ANGIOGRAPHY CHEST WITH CONTRAST TECHNIQUE: Multidetector CT imaging of the chest was performed using the standard protocol during bolus administration of intravenous contrast. Multiplanar CT image reconstructions and MIPs were obtained to evaluate the vascular anatomy. CONTRAST:  53mL OMNIPAQUE IOHEXOL 350 MG/ML SOLN COMPARISON:  None. FINDINGS: Cardiovascular: There is adequate opacification of the pulmonary arterial tree. There are branching intraluminal filling defects identified within the lobar pulmonary arteries bilaterally as well as several intraluminal filling defects within multiple segmental pulmonary arteries of the lower lobes bilaterally in keeping with acute pulmonary embolism. The central pulmonary arteries are of normal caliber. There is CT evidence of right heart strain, however, with an RV/LV ratio of 1.34. Moderate multi-vessel coronary artery calcification. Global cardiac size within normal limits. No pericardial effusion. Mild atherosclerotic calcification noted within the thoracic aorta. No aortic aneurysm. Mediastinum/Nodes: No enlarged mediastinal, hilar, or axillary lymph nodes. Thyroid gland, trachea, and esophagus demonstrate no significant findings. Small hiatal hernia. Lungs/Pleura: There is collapse and consolidation of the left lower lobe. Scattered nonspecific infiltrate within the a left upper lobe. The right  lung is clear. No pneumothorax or pleural effusion. No central obstructing lesion. There is mild bronchial wall thickening noted centrally in keeping with mild airway inflammation.  Upper Abdomen: No acute abnormality. Musculoskeletal: No acute bone abnormality. Review of the MIP images confirms the above findings. IMPRESSION: Positive for acute PE with CT evidence of right heart strain (RV/LV Ratio = 1.34) consistent with at least submassive (intermediate risk) PE. The presence of right heart strain has been associated with an increased risk of morbidity and mortality. Please refer to the "PE Focused" order set in EPIC. Collapse and consolidation of the left lower lobe. Bronchial wall thickening in keeping with airway inflammation. Moderate multi-vessel coronary artery calcification. Aortic Atherosclerosis (ICD10-I70.0). Attempts are being made at this time to contact the managing clinician for direct verbal communication of these findings. Electronically Signed   By: Helyn Numbers M.D.   On: 12/28/2020 00:19   MR BRAIN W WO CONTRAST  Result Date: 12/24/2020 CLINICAL DATA:  Multiple sclerosis with new event. Mental status change EXAM: MRI HEAD WITHOUT AND WITH CONTRAST TECHNIQUE: Multiplanar, multiecho pulse sequences of the brain and surrounding structures were obtained without and with intravenous contrast. CONTRAST:  7.55mL GADAVIST GADOBUTROL 1 MMOL/ML IV SOLN COMPARISON:  Head CT from earlier today.  Brain MRI from 11/02/2017 FINDINGS: Brain: Confluent FLAIR hyperintensity in the cerebral white matter extending from periventricular to juxtacortical in multiple locations, more confluent than before. Cerebral volume loss especially affecting the white matter with corpus callosum thinning. Small remote cerebellar infarcts. Scattered black holes or chronic lacune. No restricted diffusion, hemorrhage, hydrocephalus, or collection. Vascular: Preserved flow voids and vessel enhancements. Skull and upper cervical spine: Normal marrow signal Sinuses/Orbits: Negative IMPRESSION: 1. History of multiple sclerosis with advanced white matter disease that has progressed from 2019. 2. Progressive  white matter atrophy. 3. Some degree of chronic small vessel ischemia is likely. Small remote cerebellar infarcts. 4. No acute or subacute insult. Electronically Signed   By: Marnee Spring M.D.   On: 12/24/2020 08:41   DG Pelvis Portable  Result Date: 12/23/2020 CLINICAL DATA:  Sepsis EXAM: PORTABLE PELVIS 1-2 VIEWS COMPARISON:  None. FINDINGS: Mild degenerative changes in the hips bilaterally with joint space narrowing and spurring. No acute bony abnormality. Specifically, no fracture, subluxation, or dislocation. IMPRESSION: No acute bony abnormality. Electronically Signed   By: Charlett Nose M.D.   On: 12/23/2020 22:00   DG CHEST PORT 1 VIEW  Result Date: 01/01/2021 CLINICAL DATA:  Shortness of breath. Evaluate basilar opacities. Pneumothorax. EXAM: PORTABLE CHEST 1 VIEW COMPARISON:  12/27/2020 FINDINGS: Feeding tube extends beyond the inferior aspect of the film. Midline trachea. Mild cardiomegaly. Atherosclerosis in the transverse aorta. Small left pleural effusion is similar. No pneumothorax. Asymmetric interstitial edema is greater left than right and not significantly changed. Left greater than right base airspace opacities are also felt to be similar. IMPRESSION: Since 12/27/2020, new feeding tube. Otherwise, little change in appearance of the chest. Cardiomegaly with mild pulmonary venous congestion and small left pleural effusion. Left greater than right airspace opacities, favoring atelectasis. Electronically Signed   By: Jeronimo Greaves M.D.   On: 01/01/2021 13:03   DG CHEST PORT 1 VIEW  Result Date: 12/27/2020 CLINICAL DATA:  Shortness of breath.  Failure to thrive. EXAM: PORTABLE CHEST 1 VIEW COMPARISON:  12/23/2020; 06/01/2020 FINDINGS: The examination is degraded due to portable technique and patient positioning. Grossly unchanged borderline enlarged cardiac silhouette and mediastinal contours given reduced lung volumes and patient rotation. Atherosclerotic plaque within  the thoracic aorta.  Interval development of left basilar heterogeneous/consolidative opacities. Mild pulmonary venous congestion without frank evidence of edema. Suspected trace left-sided effusion. No pneumothorax. No acute osseous abnormalities. IMPRESSION: 1. Interval development of left basilar heterogeneous/consolidative opacities - atelectasis versus infiltrate/aspiration. Further evaluation with a PA and lateral chest radiograph may be obtained as clinically indicated. 2. Similar findings of cardiomegaly and pulmonary venous congestion without frank evidence of edema. Electronically Signed   By: Simonne Come M.D.   On: 12/27/2020 08:24   DG Chest Portable 1 View  Result Date: 12/23/2020 CLINICAL DATA:  Sepsis EXAM: PORTABLE CHEST 1 VIEW COMPARISON:  06/01/2020 FINDINGS: Heart is normal size. Lungs clear. No confluent airspace opacities or effusions. Healing left rib fractures again noted. IMPRESSION: No acute cardiopulmonary disease. Electronically Signed   By: Charlett Nose M.D.   On: 12/23/2020 21:59   DG Abd Portable 1V  Result Date: 12/30/2020 CLINICAL DATA:  Feeding tube placement. EXAM: PORTABLE ABDOMEN - 1 VIEW COMPARISON:  December 24, 2020. FINDINGS: The bowel gas pattern is normal. Distal tip of feeding tube is seen in expected position of distal stomach. No radio-opaque calculi or other significant radiographic abnormality are seen. IMPRESSION: Distal tip of feeding tube seen in expected position of distal stomach. Electronically Signed   By: Lupita Raider M.D.   On: 12/30/2020 12:34   DG Abd Portable 1V  Result Date: 12/24/2020 CLINICAL DATA:  MRI clearance (brain MRI) EXAM: PORTABLE ABDOMEN - 1 VIEW COMPARISON:  10/23/2017 FINDINGS: GDA region embolization coils which were placed in 2019. Subsequent brain MRI has been successfully obtained in 2019. Gas and stool is seen throughout the colon. Clear lung bases. IMPRESSION: Arterial embolization coils placed in 2019. No contraindication to MRI. Electronically  Signed   By: Marnee Spring M.D.   On: 12/24/2020 06:02   VAS [place] LOWER EXTREMITY VENOUS (DVT)  Result Date: 12/30/2020  Lower Venous DVT Study Patient Name:  [name]  Date of Exam:   12/29/2020 Medical Rec #: [id number]     Accession #:    [id number] Date of Birth: 1955/09/16     Patient Gender: M Patient Age:   31 years Exam Location:  [facility] Procedure:      VAS [place] LOWER EXTREMITY VENOUS (DVT) Referring Phys: Melody Comas --------------------------------------------------------------------------------  Indications: PE with acute respiratory failure/hypoxia.  Comparison Study: Previous exam 12/10/2017 LLE - negative Performing Technologist: Jody Hill RVT, RDMS  Examination Guidelines: A complete evaluation includes B-mode imaging, spectral Doppler, color Doppler, and power Doppler as needed of all accessible portions of each vessel. Bilateral testing is considered an integral part of a complete examination. Limited examinations for reoccurring indications may be performed as noted. The reflux portion of the exam is performed with the patient in reverse Trendelenburg.  +---------+---------------+---------+-----------+----------+--------------+ RIGHT    CompressibilityPhasicitySpontaneityPropertiesThrombus Aging +---------+---------------+---------+-----------+----------+--------------+ CFV      Full           Yes      Yes                                 +---------+---------------+---------+-----------+----------+--------------+ SFJ      Full                                                        +---------+---------------+---------+-----------+----------+--------------+  FV Prox  Full           Yes      Yes                                 +---------+---------------+---------+-----------+----------+--------------+ FV Mid   Full           Yes      Yes                                 +---------+---------------+---------+-----------+----------+--------------+ FV  DistalFull           Yes      Yes                                 +---------+---------------+---------+-----------+----------+--------------+ PFV      Full                                                        +---------+---------------+---------+-----------+----------+--------------+ POP      Full           Yes      Yes                                 +---------+---------------+---------+-----------+----------+--------------+ PTV                                                   Not visualized +---------+---------------+---------+-----------+----------+--------------+ PERO                                                  Not visualized +---------+---------------+---------+-----------+----------+--------------+   Right Technical Findings: Not visualized segments include peroneal and posterior tibial veins. PeroV and PTVs not visualized due to patient being contracted and laying on side.  +---------+---------------+---------+-----------+----------+--------------+ LEFT     CompressibilityPhasicitySpontaneityPropertiesThrombus Aging +---------+---------------+---------+-----------+----------+--------------+ CFV      Full           Yes      Yes                                 +---------+---------------+---------+-----------+----------+--------------+ SFJ      Full                                                        +---------+---------------+---------+-----------+----------+--------------+ FV Prox  Full           Yes      Yes                                 +---------+---------------+---------+-----------+----------+--------------+  FV Mid   Full           Yes      Yes                                 +---------+---------------+---------+-----------+----------+--------------+ FV DistalFull           Yes      Yes                                 +---------+---------------+---------+-----------+----------+--------------+ PFV      Full                                                         +---------+---------------+---------+-----------+----------+--------------+ POP      Full           Yes      Yes                                 +---------+---------------+---------+-----------+----------+--------------+ PTV      Full                                                        +---------+---------------+---------+-----------+----------+--------------+ PERO     Full                                                        +---------+---------------+---------+-----------+----------+--------------+     Summary: BILATERAL: - No evidence of deep vein thrombosis seen in the lower extremities, bilaterally. 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 -No evidence of popliteal cyst, bilaterally. RIGHT: - There is no evidence of deep vein thrombosis in the lower extremity. However, portions of this examination were limited- see technologist comments above.   *See table(s) above for measurements and observations. Electronically signed by Heath Lark on 12/30/2020 at 4:38:17 PM.    Final    ECHOCARDIOGRAM LIMITED  Result Date: 12/28/2020    ECHOCARDIOGRAM LIMITED REPORT   Patient Name:   [name] Date of Exam: 12/28/2020 Medical Rec #:  [id number]    Height:       71.0 in Accession #:    [id number]   Weight:       145.7 lb Date of Birth:  [birth year]    BSA:          1.843 m Patient Age:    65 years     BP:           89/57 mmHg Patient Gender: M            HR:           86 bpm. Exam Location:  Inpatient Procedure: 3D Echo, Limited Echo, Limited Color Doppler and Cardiac Doppler STAT ECHO Indications:     Pulmonary Embolus I26.09  History:  Patient has no prior history of Echocardiogram examinations.                  Risk Factors:Current Smoker. Acute metabolic encephalopathy.                  Polysubstance abuse. Rhabdomyolysis.  Sonographer:     Leta Jungling RDCS Referring Phys:  [id number]  Martina Sinner Diagnosing Phys: Rinaldo Cloud MD IMPRESSIONS  1. Left ventricular ejection fraction, by estimation, is 50 to 55%. The left ventricle has normal function. The left ventricle has no regional wall motion abnormalities. Left ventricular diastolic parameters are consistent with Grade I diastolic dysfunction (impaired relaxation).  2. Right ventricular systolic function is mildly reduced. The right ventricular size is mildly enlarged. There is moderately elevated pulmonary artery systolic pressure.  3. The mitral valve is normal in structure. Trivial mitral valve regurgitation.  4. Cannot rule out vegetation . TEE RECOMMENDED IF CLINICALLY INDICATED.. Tricuspid valve regurgitation is mild.  5. The aortic valve is normal in structure. Aortic valve regurgitation is not visualized. No aortic stenosis is present.  6. The inferior vena cava is normal in size with greater than 50% respiratory variability, suggesting right atrial pressure of 3 mmHg. FINDINGS  Left Ventricle: Left ventricular ejection fraction, by estimation, is 50 to 55%. The left ventricle has normal function. The left ventricle has no regional wall motion abnormalities. The left ventricular internal cavity size was normal in size. There is  no left ventricular hypertrophy. Left ventricular diastolic parameters are consistent with Grade I diastolic dysfunction (impaired relaxation). Right Ventricle: The right ventricular size is mildly enlarged. Right ventricular systolic function is mildly reduced. There is moderately elevated pulmonary artery systolic pressure. The tricuspid regurgitant velocity is 3.38 m/s, and with an assumed right atrial pressure of 3 mmHg, the estimated right ventricular systolic pressure is 48.7 mmHg. Left Atrium: Left atrial size was normal in size. Right Atrium: Right atrial size was normal in size. Pericardium: There is no evidence of pericardial effusion. Mitral Valve: The mitral valve is normal in structure.  Trivial mitral valve regurgitation. Tricuspid Valve: Cannot rule out vegetation . TEE RECOMMENDED IF CLINICALLY INDICATED. Tricuspid valve regurgitation is mild. Aortic Valve: The aortic valve is normal in structure. Aortic valve regurgitation is not visualized. No aortic stenosis is present. Pulmonic Valve: The pulmonic valve was normal in structure. Pulmonic valve regurgitation is trivial. Aorta: The aortic root is normal in size and structure. Venous: The inferior vena cava is normal in size with greater than 50% respiratory variability, suggesting right atrial pressure of 3 mmHg. LEFT VENTRICLE PLAX 2D LVIDd:         4.30 cm     Diastology LVIDs:         3.10 cm     LV e' medial:    5.98 cm/s LV PW:         0.80 cm     LV E/e' medial:  7.5 LV IVS:        0.90 cm     LV e' lateral:   7.51 cm/s LVOT diam:     2.30 cm     LV E/e' lateral: 5.9 LV SV:         47 LV SV Index:   26 LVOT Area:     4.15 cm  LV Volumes (MOD) LV vol d, MOD A2C: 72.4 ml LV vol d, MOD A4C: 87.1 ml LV vol s, MOD A2C: 47.4 ml LV vol s, MOD A4C:  40.5 ml LV SV MOD A2C:     25.0 ml LV SV MOD A4C:     87.1 ml LV SV MOD BP:      34.9 ml RIGHT VENTRICLE RV S prime:     11.50 cm/s TAPSE (M-mode): 1.5 cm LEFT ATRIUM         Index      RIGHT ATRIUM           Index LA diam:    2.80 cm 1.52 cm/m RA Area:     12.40 cm                                RA Volume:   32.50 ml  17.63 ml/m  AORTIC VALVE LVOT Vmax:   64.30 cm/s LVOT Vmean:  39.800 cm/s LVOT VTI:    0.114 m  AORTA Ao Asc diam: 3.00 cm MITRAL VALVE               TRICUSPID VALVE MV Area (PHT): 4.57 cm    TR Peak grad:   45.7 mmHg MV Decel Time: 166 msec    TR Vmax:        338.00 cm/s MV E velocity: 44.60 cm/s MV A velocity: 61.70 cm/s  SHUNTS MV E/A ratio:  0.72        Systemic VTI:  0.11 m                            Systemic Diam: 2.30 cm Rinaldo Cloud MD Electronically signed by Rinaldo Cloud MD Signature Date/Time: 12/28/2020/11:14:03 AM    Final     Microbiology: Recent Results (from the  past 240 hour(s))  MRSA Next Gen by PCR, Nasal     Status: None   Collection Time: 12/28/20  2:05 AM   Specimen: Nasal Mucosa; Nasal Swab  Result Value Ref Range Status   MRSA by PCR Next Gen NOT DETECTED NOT DETECTED Final    Comment: (NOTE) The GeneXpert MRSA Assay (FDA approved for NASAL specimens only), is one component of a comprehensive MRSA colonization surveillance program. It is not intended to diagnose MRSA infection nor to guide or monitor treatment for MRSA infections. Test performance is not FDA approved in patients less than 58 years old. Performed at [facility], [address], [place], [place] [address]      Labs: Basic Metabolic Panel: Recent Labs  Lab 12/31/20 0041 01/01/21 0123 01/02/21 0041 01/04/21 0107  NA 148* 148* 143 139  K 3.9 4.4 4.7 4.4  CL 120* 120* 112* 110  CO2 21* 21* 23 22  GLUCOSE 156* 129* 107* 124*  BUN 25* CREATININE 0.84 0.82 0.69 0.76  CALCIUM 8.3* 8.1* 8.2* 8.3*  MG 2.1 1.9  --   --   PHOS 3.2 3.6  --   --    Liver Function Tests: No results for input(s): AST, ALT, ALKPHOS, BILITOT, PROT, ALBUMIN in the last 168 hours. No results for input(s): LIPASE, AMYLASE in the last 168 hours. No results for input(s): AMMONIA in the last 168 hours. CBC: Recent Labs  Lab 12/30/20 0305 12/31/20 0041  WBC 6.3 5.0  HGB 9.1* 9.3*  HCT 28.4* 29.7*  MCV 95.3 95.5  PLT 239 285   Cardiac Enzymes: No results for input(s): CKTOTAL, CKMB, CKMBINDEX, TROPONINI in the last 168 hours. BNP: BNP (last 3 results) Recent  Labs    12/28/20 0129  BNP 485.7*    ProBNP (last 3 results) No results for input(s): PROBNP in the last 8760 hours.  CBG: Recent Labs  Lab 01/03/21 2040 01/04/21 0036 01/04/21 0431 01/04/21 0759 01/04/21 1156  GLUCAP 96 138* 128* 113* 93    Principal Problem:   Acute respiratory failure with hypoxia (HCC) Active Problems:   MS (multiple sclerosis) (HCC)   Alcohol abuse   Bipolar I disorder  (HCC)   Elevated brain natriuretic peptide (BNP) level   Acute encephalopathy   Polysubstance abuse (HCC)   Decubitus ulcer of hip   Rhabdomyolysis   Protein-calorie malnutrition, severe   Acute pulmonary embolism (HCC)   Drug abuse, marijuana   Aspiration pneumonia (HCC)   Time coordinating discharge: 38 minutes  Signed:        Trecia Maring, DO [facility]  01/05/2021, 2:44 PM

## 2021-01-11 ENCOUNTER — Encounter: Payer: Self-pay | Admitting: *Deleted

## 2021-01-11 ENCOUNTER — Telehealth: Payer: Self-pay | Admitting: *Deleted

## 2021-01-11 NOTE — Telephone Encounter (Signed)
Received fax from [facility] asking if pt needs to re-titrate tefidera, have not filled since 01/2020. Called and spoke w/ Alaina and advised pt no showed last appt and has not been seen since 04/2020. She states CB,RN called yesterday to let them know he must follow up first before any future refills. They tried reaching pt to let him know but phone# not working. I will send pt letter.

## 2021-01-21 DEATH — deceased
